# Patient Record
Sex: Female | Born: 1935 | Race: White | Hispanic: No | Marital: Married | Smoking: Never smoker
Health system: Southern US, Community
[De-identification: ages and names within clinical notes are randomized; demographics above are authoritative.]

## PROBLEM LIST (undated history)

## (undated) DIAGNOSIS — R51 Headache: Secondary | ICD-10-CM

## (undated) DIAGNOSIS — F419 Anxiety disorder, unspecified: Secondary | ICD-10-CM

## (undated) DIAGNOSIS — R251 Tremor, unspecified: Secondary | ICD-10-CM

## (undated) DIAGNOSIS — C801 Malignant (primary) neoplasm, unspecified: Secondary | ICD-10-CM

## (undated) DIAGNOSIS — H269 Unspecified cataract: Secondary | ICD-10-CM

## (undated) DIAGNOSIS — F329 Major depressive disorder, single episode, unspecified: Secondary | ICD-10-CM

## (undated) DIAGNOSIS — M549 Dorsalgia, unspecified: Secondary | ICD-10-CM

## (undated) DIAGNOSIS — IMO0001 Reserved for inherently not codable concepts without codable children: Secondary | ICD-10-CM

## (undated) DIAGNOSIS — E785 Hyperlipidemia, unspecified: Secondary | ICD-10-CM

## (undated) DIAGNOSIS — F32A Depression, unspecified: Secondary | ICD-10-CM

## (undated) DIAGNOSIS — I10 Essential (primary) hypertension: Secondary | ICD-10-CM

## (undated) DIAGNOSIS — G7000 Myasthenia gravis without (acute) exacerbation: Secondary | ICD-10-CM

## (undated) DIAGNOSIS — J189 Pneumonia, unspecified organism: Secondary | ICD-10-CM

## (undated) DIAGNOSIS — Z87442 Personal history of urinary calculi: Secondary | ICD-10-CM

## (undated) DIAGNOSIS — K219 Gastro-esophageal reflux disease without esophagitis: Secondary | ICD-10-CM

## (undated) DIAGNOSIS — D649 Anemia, unspecified: Secondary | ICD-10-CM

## (undated) DIAGNOSIS — H3530 Unspecified macular degeneration: Secondary | ICD-10-CM

## (undated) DIAGNOSIS — M1990 Unspecified osteoarthritis, unspecified site: Secondary | ICD-10-CM

## (undated) DIAGNOSIS — Z8669 Personal history of other diseases of the nervous system and sense organs: Secondary | ICD-10-CM

## (undated) DIAGNOSIS — Z974 Presence of external hearing-aid: Secondary | ICD-10-CM

## (undated) DIAGNOSIS — G8929 Other chronic pain: Secondary | ICD-10-CM

## (undated) DIAGNOSIS — H409 Unspecified glaucoma: Secondary | ICD-10-CM

## (undated) DIAGNOSIS — M4800 Spinal stenosis, site unspecified: Secondary | ICD-10-CM

## (undated) HISTORY — DX: Personal history of other diseases of the nervous system and sense organs: Z86.69

## (undated) HISTORY — DX: Unspecified glaucoma: H40.9

## (undated) HISTORY — PX: CHOLECYSTECTOMY: SHX55

## (undated) HISTORY — PX: SPINAL CORD STIMULATOR IMPLANT: SHX2422

## (undated) HISTORY — DX: Anemia, unspecified: D64.9

## (undated) HISTORY — PX: TONSILLECTOMY: SUR1361

## (undated) HISTORY — PX: ABDOMINAL HYSTERECTOMY: SHX81

## (undated) HISTORY — PX: COLON SURGERY: SHX602

## (undated) HISTORY — DX: Other chronic pain: G89.29

## (undated) HISTORY — DX: Hyperlipidemia, unspecified: E78.5

## (undated) HISTORY — DX: Depression, unspecified: F32.A

## (undated) HISTORY — DX: Major depressive disorder, single episode, unspecified: F32.9

## (undated) HISTORY — DX: Unspecified cataract: H26.9

## (undated) HISTORY — DX: Dorsalgia, unspecified: M54.9

## (undated) HISTORY — DX: Anxiety disorder, unspecified: F41.9

## (undated) HISTORY — PX: UPPER GASTROINTESTINAL ENDOSCOPY: SHX188

## (undated) HISTORY — DX: Tremor, unspecified: R25.1

## (undated) HISTORY — DX: Essential (primary) hypertension: I10

---

## 2000-12-01 ENCOUNTER — Ambulatory Visit (HOSPITAL_COMMUNITY): Admission: RE | Admit: 2000-12-01 | Discharge: 2000-12-01 | Payer: Self-pay | Admitting: Cardiology

## 2000-12-01 ENCOUNTER — Encounter: Payer: Self-pay | Admitting: Cardiology

## 2002-03-29 ENCOUNTER — Ambulatory Visit (HOSPITAL_COMMUNITY): Admission: RE | Admit: 2002-03-29 | Discharge: 2002-03-29 | Payer: Self-pay | Admitting: Cardiology

## 2002-03-29 ENCOUNTER — Encounter: Payer: Self-pay | Admitting: Cardiology

## 2002-04-07 ENCOUNTER — Ambulatory Visit (HOSPITAL_COMMUNITY): Admission: RE | Admit: 2002-04-07 | Discharge: 2002-04-07 | Payer: Self-pay | Admitting: Cardiology

## 2002-07-27 ENCOUNTER — Ambulatory Visit (HOSPITAL_COMMUNITY): Admission: RE | Admit: 2002-07-27 | Discharge: 2002-07-27 | Payer: Self-pay | Admitting: Ophthalmology

## 2003-05-10 ENCOUNTER — Encounter: Payer: Self-pay | Admitting: Cardiology

## 2003-05-10 ENCOUNTER — Encounter: Admission: RE | Admit: 2003-05-10 | Discharge: 2003-05-10 | Payer: Self-pay | Admitting: Cardiology

## 2004-02-12 ENCOUNTER — Ambulatory Visit (HOSPITAL_COMMUNITY): Admission: RE | Admit: 2004-02-12 | Discharge: 2004-02-12 | Payer: Self-pay | Admitting: Cardiology

## 2004-03-21 ENCOUNTER — Ambulatory Visit (HOSPITAL_COMMUNITY): Admission: RE | Admit: 2004-03-21 | Discharge: 2004-03-21 | Payer: Self-pay | Admitting: Obstetrics and Gynecology

## 2007-05-18 HISTORY — PX: US ECHOCARDIOGRAPHY: HXRAD669

## 2010-11-01 ENCOUNTER — Ambulatory Visit: Payer: Self-pay | Admitting: Cardiology

## 2011-04-19 NOTE — H&P (Signed)
NAME:  Tamara Gregory, Tamara Gregory                          ACCOUNT NO.:  [id number]   MEDICAL RECORD NO.:  [id number]                   PATIENT TYPE:  OIB   LOCATION:  2899                                 FACILITY:  [facility]   PHYSICIAN:  Guadelupe Sabin, M.D.             DATE OF BIRTH:  [birth year]   DATE OF ADMISSION:  07/27/2002  DATE OF DISCHARGE:                                HISTORY & PHYSICAL   PRESENT ILLNESS:  This was a planned outpatient surgical admission of this  77 year old white female admitted for cataract implant surgery of the left  eye.  This patient has been followed in my office for progressive cataract  formation.  She has recently noted increased difficulty in seeing and has  noted black clouds in her vision, trouble seeing the tennis ball on playing  tennis and having to adjust her glasses all the time while trying to read.  She has elected to proceed with cataract implant surgery of the left eye  now, right eye later.   PAST MEDICAL HISTORY:  The patient is in good general health under the care  of Dr. Othelia Pulling.  She has had a recent skin melanoma removed from her  face with surgery.   MEDICATIONS:  The patient takes multiple medications at the present time  including Estrogen, Zestril, Hydrochlorothiazide, Lysine, Vitamin C, E,  Betacarotene, Calcium, Avapro, Zoloft.   REVIEW OF SYSTEMS:  No cardiorespiratory complaints.   PHYSICAL EXAMINATION:  Vital signs as recorded on admission:  blood pressure  126/83, respirations 16, heart rate 68, temperature 97.5.  GENERAL APPEARANCE:  The patient is a pleasant, well-nourished, well-  developed, 77 year old white female in no acute distress.  Visual acuity  with best correction 20/30 -2 right eye, 20/50 left eye.  Applanation  tonometry near vision 20/50, applanation tonometry 12 mm each eye.  Extraocular and slit-lamp examination:  the eyes are wide and clear with  posterior subcapsular cataract change both eyes.   Detailed fundus  examination was dilated reveals the lens haze noted above.  The vitreous is  clear.  The retina is attached.  There is a small floater in front of the  optic nerve of the right eye.  There are a few retinal pigment epithelial  defects and drusen in both eyes.  CHEST:  Lungs clear to percussion and auscultation.  HEART:  Normal sinus rhythm.  No cardiomegaly.  No murmurs.  ABDOMEN:  Negative.  EXTREMITIES:  Negative.   ADMISSION DIAGNOSIS:  Posterior subcapsular cataract both eyes.   SURGICAL PLAN:  Cataract implant surgery left eye now, right eye later as  needed.                                               Guadelupe Sabin, M.D.  HNJ/MEDQ  D:  07/27/2002  T:  07/28/2002  Job:  [id number]

## 2011-04-19 NOTE — Procedures (Signed)
[place]  Patient:    Tamara Gregory, Tamara Gregory Visit Number: [id number] MRN: [id number]          Service Type: Attending:  Winn Jock. Smitty Cords, M.D. Dictated by:   Winn Jock Smitty Cords, M.D. Proc. Date: 04/07/02                             Procedure Report  CLINICAL DATA:  A 77 year old, white, married female with a history of hypertension, cardiomegaly, a family history of coronary disease, elevated lipids, and vague symptoms of marked fatigue on exertion.  She has a normal resting work-up without evidence of any other organic problems.  She has been submitted to a treadmill stress procedure to identify any underlying coronary artery disease or ischemia.  CURRENT MEDICATIONS: 1. Zestril. 2. Zoloft. 3. Pravachol. 4. Protonix. 5. Avapro 150 mg q.a.m.  PHYSICAL EXAMINATION:  Her weight is about 145 pounds.  Blood pressure at rest 160/80.  LABORATORY DATA:  Resting EKG normal with no evidence of LVH or abnormal ST-T wave changes.  DESCRIPTION OF PROCEDURE:  She was submitted to the standard Bruce protocol. She exercised a total of 3 minutes and 15 seconds, at which time she complained of marked fatigue.  She had a short run of SVT of about 10 beats. There were no ST or T wave changes.  She had experienced no chest pain. Following the procedure, post exercise the blood pressure returned to a level of 120/70 after five minutes and her pulse returned to 81.  She received a total peak pulse of 138.  There were no abnormal EKG changes otherwise noted. The Duke score of +6 indicates that she does not present as a major risk factor for immediate ischemic changes and represents a fair level of exercise, although she did not complete the total of about 6 mets.  Her prognosis appears to be good.  She will be further studied and identified to rule out other vascular components to her complaints.  Metabolic studies have thus far been negative. Dictated by:    Winn Jock Smitty Cords, M.D. Attending:  Winn Jock. Smitty Cords, M.D. DD:  04/09/02 TD:  04/10/02 Job: [id number]

## 2011-04-19 NOTE — H&P (Signed)
NAME:  Tamara Gregory, SOHAIL                          ACCOUNT NO.:  [id number]   MEDICAL RECORD NO.:  [id number]                   PATIENT TYPE:  OIB   LOCATION:  2899                                 FACILITY:  [facility]   PHYSICIAN:  Guadelupe Sabin, M.D.             DATE OF BIRTH:  [birth year]   DATE OF ADMISSION:  07/27/2002  DATE OF DISCHARGE:                                HISTORY & PHYSICAL   REASON FOR ADMISSION:  This was a planned outpatient surgical admission of  this 77 year old white female admitted for cataract implant surgery of the  left eye.   HISTORY OF PRESENT ILLNESS:  This patient has been followed in my office for  routine eye care since July 31, 1970.  Initial examination at that time revealed an uncorrected acuity of 20/30  right eye, 20/25 left eye.  Essentially normal ocular examination was found  with presbyopia.  The patient was followed for routine refraction changes.  The patient returned on July 01, 2002 and was noting blurred vision in both  eyes with trouble seeing her tennis ball, trouble with reading and the  patient was found to have posterior subcapsular cataracts in both eyes.  Acuity with best correction was recorded at 20/30- right eye, 20/50 left  eye.  Due to the patient's multiple complaints of poor vision and blurred  vision it was elected to proceed with cataract implant surgery of the left  eye now, right eye later.  The patient was given oral discussion and printed  information concerning the procedure and its possible complications.  She  signed an informed consent and arrangements were made for her outpatient  admission at this time.   PAST MEDICAL HISTORY:  The patient is in stable general health under the  care of Dr. Othelia Pulling.  She is felt to be in satisfactory condition for  the proposed surgery.  She has had a melanoma removed from her face.   MEDICATIONS:  She takes multiple medications including estrogen, Zestril,  hydrochlorothiazide, lysine, vitamin C, E, beta carotene, calcium, Avapro,  Zoloft, and ICAPS.   REVIEW OF SYSTEMS:  No cardiorespiratory complaints.   PHYSICAL EXAMINATION:  VITAL SIGNS: Blood pressure 126/83, respirations 16,  heart rate 68, temperature 97.5.  GENERAL APPEARANCE: The patient is a pleasant, well-nourished, well-  developed 77 year old white female in no acute distress.  HEENT: Eyes - Ocular exam as noted above with visual acuity.  Applanation  tonometry normal.  Detailed fundus examination - Dilated with Mydriacyl  reveals slight age-related macular degeneration with retinal pigment layer  epithial defects, few yellow dot drusen are noted temporal to the macula.  CHEST: Lungs clear to percussion and auscultation.  HEART: Normal sinus rhythm; no cardiomegaly, no murmurs.  ABDOMEN: Negative.  EXTREMITIES: Negative.   <ADMISSION DIAGNOSES/>  1. Posterior subcapsular cataract both eyes.  2. 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   PLAN:  Proceed with  cataract implant surgery left eye now, right eye later.                                               Guadelupe Sabin, M.D.    HNJ/MEDQ  D:  08/07/2002  T:  08/09/2002  Job:  [id number]

## 2011-04-19 NOTE — Op Note (Signed)
NAME:  Tamara Gregory, Tamara Gregory                          ACCOUNT NO.:  [id number]   MEDICAL RECORD NO.:  [id number]                   PATIENT TYPE:  OIB   LOCATION:  2899                                 FACILITY:  [facility]   PHYSICIAN:  Guadelupe Sabin, M.D.             DATE OF BIRTH:  [birth year]   DATE OF PROCEDURE:  07/27/2002  DATE OF DISCHARGE:                                 OPERATIVE REPORT   PREOPERATIVE DIAGNOSIS:  Posterior subcapsular cataract left eye.   POSTOPERATIVE DIAGNOSIS:  Posterior subcapsular cataract left eye.   OPERATION:  Planned extracapsular cataract extraction-phaco emulsification,  primary insertion of posterior chamber intraocular lens implant.   SURGEON:  Guadelupe Sabin, M.D.   ASSISTANT:  Nurse.   ANESTHESIA:  Local, 4% Xylocaine, 0.75 Marcaine, retrobulbar block, topical  Tetracaine, intraocular Xylocaine.  The patient was given Sodium fentanyl  intravenously during the period of retrobulbar blocking.   OPERATIVE PROCEDURE:  After the patient was prepped and draped the lid  speculum was inserted in the left eye.  She had tonometry was recorded at  five to six scale units with a 5.5 gram weight.  A peritomy was performed  adjacent to the limbus from the 11 to 1 o'clock position.  The corneoscleral  junction was cleaned and a corneoscleral groove made with a 45 degree super  blade.  The anterior chamber was then entered with the 2.5 mm diamond  keratome at the 12 o'clock position and the 15 degree blade at the 2:30  position.  Using a bent 26 guage needle and a Healon syringe a circular  capsulorrhexis was begun and then completed with the Grabow forceps.  Hydrodissection and hydrodelineation were performed using 1% Xylocaine.  A  30 degree phacoemulsification tip was then inserted with slow controlled  emulsification of the lens nucleus.  Total ultrasonic time 53 seconds.  Average power level 15%.  Total amount of fluid used 40 cc.  Following  removal of  the nucleus the residual cortex was aspirated with the irrigation  aspiration tip.  The posterior capsule appeared intact with a brilliant red  fundus reflex.  It was therefore elected to insert [employer] medical [employer]  [id number] three piece silcone posterior chamber intraocular lens implant.  Diopter strength +21.00.  This was inserted with a McDonald forceps into the  anterior chamber and then centered into the capsular bag using the [facility]  lens rotator.  The lens appeared to be well-centered.  The Healon which had  been used throughout the procedure was aspirated and replaced with balanced  salt solution and Miochol ophthalmic solution.  The operative incisions  appeared to be self-sealing and no sutures were required.  Maxitrol ointment  was instilled in the conjunctival cul-de-sac and a light patch and protector  shield applied.  Duration of procedure and anesthesia administration:  45  minutes.  The patient tolerated the procedure well in general, left the  operating room for the recovery room in good condition                                               Guadelupe Sabin, M.D.    HNJ/MEDQ  D:  07/27/2002  T:  07/28/2002  Job:  [id number]

## 2011-07-19 ENCOUNTER — Other Ambulatory Visit: Payer: Self-pay | Admitting: *Deleted

## 2011-07-19 DIAGNOSIS — E7800 Pure hypercholesterolemia, unspecified: Secondary | ICD-10-CM

## 2011-08-23 ENCOUNTER — Encounter: Payer: Self-pay | Admitting: Cardiology

## 2011-09-05 ENCOUNTER — Telehealth: Payer: Self-pay | Admitting: Cardiology

## 2011-09-05 ENCOUNTER — Other Ambulatory Visit: Payer: Self-pay | Admitting: Cardiology

## 2011-09-05 ENCOUNTER — Ambulatory Visit (INDEPENDENT_AMBULATORY_CARE_PROVIDER_SITE_OTHER): Payer: Medicare Other | Admitting: Cardiology

## 2011-09-05 ENCOUNTER — Encounter: Payer: Self-pay | Admitting: Cardiology

## 2011-09-05 ENCOUNTER — Other Ambulatory Visit (INDEPENDENT_AMBULATORY_CARE_PROVIDER_SITE_OTHER): Payer: Medicare Other | Admitting: *Deleted

## 2011-09-05 VITALS — BP 140/88 | HR 66 | Ht 64.0 in | Wt 156.0 lb

## 2011-09-05 DIAGNOSIS — M1990 Unspecified osteoarthritis, unspecified site: Secondary | ICD-10-CM

## 2011-09-05 DIAGNOSIS — F329 Major depressive disorder, single episode, unspecified: Secondary | ICD-10-CM

## 2011-09-05 DIAGNOSIS — I119 Hypertensive heart disease without heart failure: Secondary | ICD-10-CM | POA: Insufficient documentation

## 2011-09-05 DIAGNOSIS — E7800 Pure hypercholesterolemia, unspecified: Secondary | ICD-10-CM

## 2011-09-05 LAB — BASIC METABOLIC PANEL
BUN: 14 mg/dL (ref 6–23)
Calcium: 10.3 mg/dL (ref 8.4–10.5)
GFR: 80.6 mL/min (ref >60.00)
Glucose, Bld: 105 mg/dL — ABNORMAL HIGH (ref 70–99)

## 2011-09-05 LAB — HEPATIC FUNCTION PANEL
ALT: 20 U/L (ref 0–35)
AST: 24 U/L (ref 0–37)
Bilirubin, Direct: 0 mg/dL (ref 0.0–0.3)
Total Bilirubin: 0.5 mg/dL (ref 0.3–1.2)

## 2011-09-05 MED ORDER — PRAVASTATIN SODIUM 40 MG PO TABS: 40.0000 mg | ORAL_TABLET | Freq: Every day | ORAL | Status: DC

## 2011-09-05 MED ORDER — CITALOPRAM HYDROBROMIDE 20 MG PO TABS: 20.0000 mg | ORAL_TABLET | Freq: Every day | ORAL | Status: DC

## 2011-09-05 NOTE — Patient Instructions (Signed)
Discuss Losartan with husband to make sure she is taking and call and let us know.  Try Metamucil for gas pocket and let us know if no improvement.  Your physician wants you to follow-up in: 6 months You will receive a reminder letter in the mail two months in advance. If you don't receive a letter, please call our office to schedule the follow-up appointment.

## 2011-09-05 NOTE — Telephone Encounter (Signed)
Noted.  Message should have been Losartan not Lopressor

## 2011-09-05 NOTE — Telephone Encounter (Signed)
Pt calling to state that she is taking lopressor 100-25mg . Pt was advised to call office and let us know if she was or was not taking this medication.

## 2011-09-05 NOTE — Progress Notes (Signed)
Tamara Gregory Date of Birth:  07/16/1925 [facility] [address]   [address] [place], [place]  [address] [phone]           Fax   [phone]  HPI: This pleasant 77 year old woman is seen for a scheduled six-month followup office visit.  She has a history of essential hypertension, and hyperlipidemia.  She's also had some past symptoms of depression.  She's had trouble with her back and has been seeing a chiropractor, but has now stopped seeing him because he really didn't help her much.  He did tell her that some of her back pain was because of a pocket of gas in her abdomen.  Sometimes, doing housework bothers her back and other times, sitting quietly playing bridge, bothers her back.  Current Outpatient Prescriptions  Medication Sig Dispense Refill  . aspirin 81 MG tablet Take 81 mg by mouth daily.        . beta carotene 44034 UNIT capsule Take 25,000 Units by mouth daily.        . beta carotene w/minerals (OCUVITE) tablet Take 1 tablet by mouth daily.        . Calcium Carbonate (CALCIUM 500 PO) Take by mouth daily.        . cholecalciferol (VITAMIN D) 1000 UNITS tablet Take 1,000 Units by mouth daily.        . citalopram (CELEXA) 20 MG tablet Take 1 tablet (20 mg total) by mouth daily.  90 tablet  3  . fish oil-omega-3 fatty acids 1000 MG capsule Take 1 g by mouth daily.        . Glucosamine-Chondroit-Vit C-Mn (GLUCOSAMINE 1500 COMPLEX PO) Take by mouth daily.        [name] LYSINE PO Take by mouth daily.        . Multiple Vitamin (MULTIVITAMIN) tablet Take 1 tablet by mouth daily.        [name] omeprazole (PRILOSEC) 20 MG capsule Take 20 mg by mouth daily.        . pravastatin (PRAVACHOL) 40 MG tablet Take 1 tablet (40 mg total) by mouth daily.  90 tablet  3  . Thiamine HCl (VITAMIN B-1 PO) Take by mouth daily.        [name] losartan-hydrochlorothiazide (HYZAAR) 100-25 MG per tablet Take 1 tablet by mouth daily.          No Known Allergies  Patient Active  Problem List  Diagnoses  . Pure hypercholesterolemia  . Benign hypertensive heart disease without heart failure  . Osteoarthritis    History  Smoking status  . Never Smoker   Smokeless tobacco  . Not on file    History  Alcohol Use No    Family History  Problem Relation Age of Onset  . Heart attack Father   . Cancer Sister   . Cancer Brother   . Cancer Sister   . Cancer Sister     Review of Systems: The patient denies any heat or cold intolerance.  No weight gain or weight loss.  The patient denies headaches or blurry vision.  There is no cough or sputum production.  The patient denies dizziness.  There is no hematuria or hematochezia.  The patient denies any muscle aches or arthritis.  The patient denies any rash.  The patient denies frequent falling or instability.  There is no history of depression or anxiety.  All other systems were reviewed and are negative.   Physical Exam: Filed Vitals:  09/05/11 0900  BP: 140/88  Pulse: 66   General appearance reveals an alert woman in no acute distress.  She moves well and her back is not giving her too much trouble today.Pupils equal and reactive.   Extraocular Movements are full.  There is no scleral icterus.  The mouth and pharynx are normal.  The neck is supple.  The carotids reveal no bruits.  The jugular venous pressure is normal.  The thyroid is not enlarged.  There is no lymphadenopathy.  The chest is clear to percussion and auscultation. There are no rales or rhonchi. Expansion of the chest is symmetrical.  The precordium is quiet.  The first heart sound is normal.  The second heart sound is physiologically split.  There is no murmur gallop rub or click.  There is no abnormal lift or heave.  The abdomen is soft and nontender. Bowel sounds are normal. The liver and spleen are not enlarged. There Are no abdominal masses. There are no bruits.  The pedal pulses are good.  There is no phlebitis or edema.  There is no  cyanosis or clubbing. Strength is normal and symmetrical in all extremities.  There is no lateralizing weakness.  There are no sensory deficits.     Assessment / Plan: Blood work is pending today.  Continue same medication.  Recheck in 6 months for followup office visit and lab work

## 2011-09-05 NOTE — Assessment & Plan Note (Signed)
Patient is on pravastatin for her hypercholesterolemia.  She is not having side effects from the pravastatin

## 2011-09-05 NOTE — Assessment & Plan Note (Signed)
No chest pain or shortness of breath.  No dizziness or syncope.  She does not do any regular exercise because of her osteoarthritis of the back

## 2011-09-13 NOTE — Progress Notes (Signed)
Left message

## 2011-09-16 ENCOUNTER — Telehealth: Payer: Self-pay | Admitting: *Deleted

## 2011-09-16 ENCOUNTER — Telehealth: Payer: Self-pay | Admitting: Cardiology

## 2011-09-16 NOTE — Telephone Encounter (Signed)
Pt calling about test results   Please ca;ll

## 2011-09-16 NOTE — Telephone Encounter (Signed)
Advised of labs 

## 2011-09-16 NOTE — Progress Notes (Signed)
Advised of labs 

## 2011-09-16 NOTE — Telephone Encounter (Signed)
Results given to patient

## 2011-09-16 NOTE — Telephone Encounter (Signed)
Message copied by Burnell Blanks on Mon Sep 16, 2011  1:20 PM ------      Message from: Cassell Clement      Created: Fri Sep 06, 2011  5:48 PM       The cholesterol is borderline at 203.  Triglycerides are 160.  Liver and kidney function are normal and the blood sugar is 105.  Continue same medication and stay on careful diet.

## 2011-09-16 NOTE — Telephone Encounter (Signed)
Message copied by Burnell Blanks on Mon Sep 16, 2011  1:20 PM ------      Message from: Cassell Clement      Created: Fri Sep 06, 2011  5:49 PM       The LDL, is 117.  Work harder on diet and exercise.  Same medication.

## 2011-12-06 DIAGNOSIS — H35059 Retinal neovascularization, unspecified, unspecified eye: Secondary | ICD-10-CM | POA: Diagnosis not present

## 2011-12-06 DIAGNOSIS — H353290 Exudative age-related macular degeneration, unspecified eye, stage unspecified: Secondary | ICD-10-CM | POA: Diagnosis not present

## 2012-01-14 DIAGNOSIS — H3530 Unspecified macular degeneration: Secondary | ICD-10-CM | POA: Diagnosis not present

## 2012-01-14 DIAGNOSIS — H02409 Unspecified ptosis of unspecified eyelid: Secondary | ICD-10-CM | POA: Diagnosis not present

## 2012-01-14 DIAGNOSIS — M48061 Spinal stenosis, lumbar region without neurogenic claudication: Secondary | ICD-10-CM | POA: Diagnosis present

## 2012-01-14 DIAGNOSIS — H2640 Unspecified secondary cataract: Secondary | ICD-10-CM | POA: Diagnosis not present

## 2012-01-15 DIAGNOSIS — H3530 Unspecified macular degeneration: Secondary | ICD-10-CM | POA: Diagnosis not present

## 2012-01-15 DIAGNOSIS — H2640 Unspecified secondary cataract: Secondary | ICD-10-CM | POA: Diagnosis not present

## 2012-01-15 DIAGNOSIS — H02409 Unspecified ptosis of unspecified eyelid: Secondary | ICD-10-CM | POA: Diagnosis not present

## 2012-01-27 DIAGNOSIS — G7000 Myasthenia gravis without (acute) exacerbation: Secondary | ICD-10-CM | POA: Diagnosis not present

## 2012-01-27 DIAGNOSIS — I10 Essential (primary) hypertension: Secondary | ICD-10-CM | POA: Diagnosis not present

## 2012-01-28 ENCOUNTER — Ambulatory Visit
Admission: RE | Admit: 2012-01-28 | Discharge: 2012-01-28 | Disposition: A | Discharge status: Home / Self Care | Payer: Medicare Other | Source: Ambulatory Visit | Attending: Clinical Neurophysiology | Admitting: Clinical Neurophysiology

## 2012-01-28 ENCOUNTER — Other Ambulatory Visit: Payer: Self-pay | Admitting: Clinical Neurophysiology

## 2012-01-28 DIAGNOSIS — G7000 Myasthenia gravis without (acute) exacerbation: Secondary | ICD-10-CM | POA: Diagnosis not present

## 2012-01-28 DIAGNOSIS — I712 Thoracic aortic aneurysm, without rupture: Secondary | ICD-10-CM | POA: Diagnosis not present

## 2012-01-28 DIAGNOSIS — R918 Other nonspecific abnormal finding of lung field: Secondary | ICD-10-CM | POA: Diagnosis not present

## 2012-03-05 ENCOUNTER — Other Ambulatory Visit: Payer: Medicare Other

## 2012-03-05 ENCOUNTER — Ambulatory Visit: Payer: Medicare Other | Admitting: Cardiology

## 2012-03-10 DIAGNOSIS — H35059 Retinal neovascularization, unspecified, unspecified eye: Secondary | ICD-10-CM | POA: Diagnosis not present

## 2012-03-10 DIAGNOSIS — H353290 Exudative age-related macular degeneration, unspecified eye, stage unspecified: Secondary | ICD-10-CM | POA: Diagnosis not present

## 2012-03-17 ENCOUNTER — Ambulatory Visit (INDEPENDENT_AMBULATORY_CARE_PROVIDER_SITE_OTHER): Payer: Medicare Other | Admitting: Cardiology

## 2012-03-17 DIAGNOSIS — E7800 Pure hypercholesterolemia, unspecified: Secondary | ICD-10-CM | POA: Diagnosis not present

## 2012-03-17 DIAGNOSIS — G7000 Myasthenia gravis without (acute) exacerbation: Secondary | ICD-10-CM

## 2012-03-17 DIAGNOSIS — I119 Hypertensive heart disease without heart failure: Secondary | ICD-10-CM | POA: Diagnosis not present

## 2012-03-17 DIAGNOSIS — M1990 Unspecified osteoarthritis, unspecified site: Secondary | ICD-10-CM | POA: Diagnosis not present

## 2012-03-17 NOTE — Assessment & Plan Note (Signed)
Since she was diagnosed directly with the myasthenia gravis based upon her eye signs she has also felt better all over.  She seems to have more energy.  In retrospect some of her previous malaise and fatigue may have been related to undiagnosed myasthenia gravis affecting her skeletal muscles

## 2012-03-17 NOTE — Patient Instructions (Signed)
Your physician recommends that you continue on your current medications as directed. Please refer to the Current Medication list given to you today.  Your physician wants you to follow-up in: 6 months. You will receive a reminder letter in the mail two months in advance. If you don't receive a letter, please call our office to schedule the follow-up appointment.  

## 2012-03-17 NOTE — Progress Notes (Signed)
Tamara Gregory Date of Birth:  Jun 01, 1925 [facility] [address] [place], [place]  [address] [phone]         Fax   [phone]  History of Present Illness: This pleasant 78 year old woman is seen for a month followup office visit.  She has a past history of hypercholesterolemia and essential hypertension.  Also has chronic low back pain secondary to arthritis.  Since we last saw her she has been diagnosed with myasthenia gravis which caused her to have droopy eyelids.  She was worked up over at [facility] and now takes Mestinon when necessary.  Current Outpatient Prescriptions  Medication Sig Dispense Refill  . aspirin 81 MG tablet Take 81 mg by mouth daily.        . beta carotene 29528 UNIT capsule Take 25,000 Units by mouth daily.        . beta carotene w/minerals (OCUVITE) tablet Take 1 tablet by mouth daily.        . Calcium Carbonate (CALCIUM 500 PO) Take by mouth daily.        . cholecalciferol (VITAMIN D) 1000 UNITS tablet Take 1,000 Units by mouth daily.        . citalopram (CELEXA) 20 MG tablet Take 1 tablet (20 mg total) by mouth daily.  90 tablet  3  . fish oil-omega-3 fatty acids 1000 MG capsule Take 1 g by mouth daily.        . Glucosamine-Chondroit-Vit C-Mn (GLUCOSAMINE 1500 COMPLEX PO) Take by mouth daily.        [name] losartan-hydrochlorothiazide (HYZAAR) 100-25 MG per tablet Take 1 tablet by mouth daily.        [name] LYSINE PO Take by mouth daily.        . Multiple Vitamin (MULTIVITAMIN) tablet Take 1 tablet by mouth daily.        [name] omeprazole (PRILOSEC) 20 MG capsule Take 20 mg by mouth daily.        . pravastatin (PRAVACHOL) 40 MG tablet Take 1 tablet (40 mg total) by mouth daily.  90 tablet  3  . pyridostigmine (MESTINON) 60 MG tablet 60 mg as needed.       . Thiamine HCl (VITAMIN B-1 PO) Take by mouth daily.          No Known Allergies  Patient Active Problem List  Diagnoses  . Pure hypercholesterolemia  . Benign hypertensive heart  disease without heart failure  . Osteoarthritis  . Myasthenia gravis    History  Smoking status  . Never Smoker   Smokeless tobacco  . Not on file    History  Alcohol Use No    Family History  Problem Relation Age of Onset  . Heart attack Father   . Cancer Sister   . Cancer Brother   . Cancer Sister   . Cancer Sister     Review of Systems: Constitutional: no fever chills diaphoresis or fatigue or change in weight.  Head and neck: no hearing loss, no epistaxis, no photophobia or visual disturbance. Respiratory: No cough, shortness of breath or wheezing. Cardiovascular: No chest pain peripheral edema, palpitations. Gastrointestinal: No abdominal distention, no abdominal pain, no change in bowel habits hematochezia or melena. Genitourinary: No dysuria, no frequency, no urgency, no nocturia. Musculoskeletal:No arthralgias, no back pain, no gait disturbance or myalgias. Neurological: No dizziness, no headaches, no numbness, no seizures, no syncope, no weakness, no tremors. Hematologic: No lymphadenopathy, no easy bruising. Psychiatric: No confusion, no hallucinations, no sleep  disturbance.    Physical Exam: Filed Vitals:   03/17/12 1543  BP: 130/70  Pulse: 60   the general appearance reveals a well-developed well-nourished woman in no distress.  Her eyelids appear to be normal today.Pupils equal and reactive.   Extraocular Movements are full.  There is no scleral icterus.  The mouth and pharynx are normal.  The neck is supple.  The carotids reveal no bruits.  The jugular venous pressure is normal.  The thyroid is not enlarged.  There is no lymphadenopathy.  The chest is clear to percussion and auscultation. There are no rales or rhonchi. Expansion of the chest is symmetrical.  The precordium is quiet.  The first heart sound is normal.  The second heart sound is physiologically split.  There is no murmur gallop rub or click.  There is no abnormal lift or heave.  The  abdomen is soft and nontender. Bowel sounds are normal. The liver and spleen are not enlarged. There Are no abdominal masses. There are no bruits.  The pedal pulses are good.  There is no phlebitis or edema.  There is no cyanosis or clubbing. Strength is normal and symmetrical in all extremities.  There is no lateralizing weakness.  There are no sensory deficits.  The skin is warm and dry.  There is no rash.    Assessment / Plan:  the patient is to continue same medication.  She needs to work harder on weight loss and regular exercise.  Recheck in 6 months for followup office visit and fasting lab work

## 2012-03-17 NOTE — Assessment & Plan Note (Signed)
The patient has been on pravastatin 40 mg daily for her elevated cholesterol.  She is not having any myalgias from statin therapy.

## 2012-03-17 NOTE — Assessment & Plan Note (Signed)
The patient has a past history of elevated blood pressure.  She has responded well to current therapy.  She's not having a headaches or dizzy spells.

## 2012-03-17 NOTE — Assessment & Plan Note (Signed)
If the patient is physically active she develops an aching discomfort in her low back.  If she is able to rest for 15 minutes she is okay and is able to resume normal activity.

## 2012-03-30 ENCOUNTER — Other Ambulatory Visit: Payer: Self-pay | Admitting: Cardiology

## 2012-03-30 ENCOUNTER — Other Ambulatory Visit: Payer: Self-pay

## 2012-03-30 MED ORDER — LOSARTAN POTASSIUM-HCTZ 100-25 MG PO TABS: 1.0000 | ORAL_TABLET | Freq: Every day | ORAL | Status: DC

## 2012-04-10 DIAGNOSIS — H532 Diplopia: Secondary | ICD-10-CM | POA: Diagnosis not present

## 2012-04-23 DIAGNOSIS — G7000 Myasthenia gravis without (acute) exacerbation: Secondary | ICD-10-CM | POA: Diagnosis not present

## 2012-04-29 DIAGNOSIS — C44529 Squamous cell carcinoma of skin of other part of trunk: Secondary | ICD-10-CM | POA: Diagnosis not present

## 2012-08-24 DIAGNOSIS — Z23 Encounter for immunization: Secondary | ICD-10-CM | POA: Diagnosis not present

## 2012-09-17 DIAGNOSIS — G7000 Myasthenia gravis without (acute) exacerbation: Secondary | ICD-10-CM | POA: Diagnosis not present

## 2012-09-17 DIAGNOSIS — H3530 Unspecified macular degeneration: Secondary | ICD-10-CM | POA: Diagnosis not present

## 2012-09-18 ENCOUNTER — Other Ambulatory Visit: Payer: Self-pay | Admitting: Cardiology

## 2012-10-08 ENCOUNTER — Ambulatory Visit: Payer: Medicare Other | Admitting: Cardiology

## 2012-10-08 ENCOUNTER — Other Ambulatory Visit (INDEPENDENT_AMBULATORY_CARE_PROVIDER_SITE_OTHER): Payer: Medicare Other

## 2012-10-08 DIAGNOSIS — E7800 Pure hypercholesterolemia, unspecified: Secondary | ICD-10-CM

## 2012-10-08 DIAGNOSIS — I119 Hypertensive heart disease without heart failure: Secondary | ICD-10-CM | POA: Diagnosis not present

## 2012-10-08 LAB — BASIC METABOLIC PANEL
BUN: 16 mg/dL (ref 6–23)
Calcium: 9.4 mg/dL (ref 8.4–10.5)
GFR: 77.93 mL/min (ref >60.00)
Glucose, Bld: 115 mg/dL — ABNORMAL HIGH (ref 70–99)
Potassium: 3.3 mEq/L — ABNORMAL LOW (ref 3.5–5.1)
Sodium: 136 mEq/L (ref 135–145)

## 2012-10-08 LAB — LIPID PANEL
HDL: 59.2 mg/dL (ref >39.00)
LDL Cholesterol: 89 mg/dL (ref 0–99)
VLDL: 33 mg/dL (ref 0.0–40.0)

## 2012-10-08 LAB — HEPATIC FUNCTION PANEL
AST: 25 U/L (ref 0–37)
Albumin: 4.3 g/dL (ref 3.5–5.2)
Alkaline Phosphatase: 47 U/L (ref 39–117)
Total Bilirubin: 0.5 mg/dL (ref 0.3–1.2)

## 2012-10-08 NOTE — Progress Notes (Signed)
Quick Note:  Please make copy of labs for patient visit. ______ 

## 2012-10-16 ENCOUNTER — Encounter: Payer: Self-pay | Admitting: Cardiology

## 2012-10-16 ENCOUNTER — Ambulatory Visit (INDEPENDENT_AMBULATORY_CARE_PROVIDER_SITE_OTHER): Payer: Medicare Other | Admitting: Cardiology

## 2012-10-16 VITALS — BP 128/65 | HR 78 | Resp 18 | Ht 63.0 in | Wt 165.8 lb

## 2012-10-16 DIAGNOSIS — E876 Hypokalemia: Secondary | ICD-10-CM | POA: Diagnosis not present

## 2012-10-16 DIAGNOSIS — E7800 Pure hypercholesterolemia, unspecified: Secondary | ICD-10-CM | POA: Diagnosis not present

## 2012-10-16 DIAGNOSIS — I119 Hypertensive heart disease without heart failure: Secondary | ICD-10-CM

## 2012-10-16 DIAGNOSIS — M1990 Unspecified osteoarthritis, unspecified site: Secondary | ICD-10-CM | POA: Diagnosis not present

## 2012-10-16 DIAGNOSIS — R5383 Other fatigue: Secondary | ICD-10-CM

## 2012-10-16 DIAGNOSIS — R5381 Other malaise: Secondary | ICD-10-CM

## 2012-10-16 MED ORDER — POTASSIUM CHLORIDE ER 10 MEQ PO TBCR: 10.0000 meq | EXTENDED_RELEASE_TABLET | Freq: Every day | ORAL | Status: DC

## 2012-10-16 NOTE — Assessment & Plan Note (Signed)
The patient has not been expressing any chest pain or shortness of breath.  She does not have a lot of energy.  She fatigues easily.  She has not been experiencing any headaches or palpitations.  No syncope.

## 2012-10-16 NOTE — Patient Instructions (Addendum)
ADD POTASSIUM 10 MEQ DAILY, RX SENT TO [facility]  Your physician wants you to follow-up in: 6 months with fasting labs (lp/bmet/hfp) You will receive a reminder letter in the mail two months in advance. If you don't receive a letter, please call our office to schedule the follow-up appointment.

## 2012-10-16 NOTE — Assessment & Plan Note (Signed)
She states that she is no longer on Mestinon.  She is on prednisone 10 mg daily.  A recent labs showed hypokalemia and mild elevation of blood sugar.  Both of these may be side effects from the prednisone.  She has been trying to eat a banana a day and we will also give her K. Dur 10 mEq one daily.

## 2012-10-16 NOTE — Assessment & Plan Note (Signed)
Patient has a history of hypercholesterolemia.  She is on pravastatin and is tolerating it well without side effects.  Blood work is satisfactory

## 2012-10-16 NOTE — Progress Notes (Signed)
Tamara Gregory Date of Birth:  March 01, 1925 [facility] [address] [place], [place]  [address] [phone]         Fax   [phone]  History of Present Illness: This pleasant 78 year old woman is seen for a month followup office visit. She has a past history of hypercholesterolemia and essential hypertension. Also has chronic low back pain secondary to arthritis. Since we last saw her she has been diagnosed with myasthenia gravis which caused her to have droopy eyelids. She was worked up over at [facility] and now takes prednisone 10 mg daily.   Current Outpatient Prescriptions  Medication Sig Dispense Refill  . aspirin 81 MG tablet Take 81 mg by mouth daily.        . beta carotene 57846 UNIT capsule Take 25,000 Units by mouth daily.        . beta carotene w/minerals (OCUVITE) tablet Take 1 tablet by mouth daily.        . Calcium Carbonate (CALCIUM 500 PO) Take by mouth daily.        . cholecalciferol (VITAMIN D) 1000 UNITS tablet Take 1,000 Units by mouth daily.        . citalopram (CELEXA) 20 MG tablet TAKE 1 TABLET DAILY  90 tablet  PRN  . fish oil-omega-3 fatty acids 1000 MG capsule Take 1 g by mouth daily.        . Glucosamine-Chondroit-Vit C-Mn (GLUCOSAMINE 1500 COMPLEX PO) Take by mouth daily.        [name] losartan-hydrochlorothiazide (HYZAAR) 100-25 MG per tablet Take 1 tablet by mouth daily.  30 tablet  6  . LYSINE PO Take by mouth daily.        . Multiple Vitamin (MULTIVITAMIN) tablet Take 1 tablet by mouth daily.        . pravastatin (PRAVACHOL) 40 MG tablet TAKE 1 TABLET DAILY  90 tablet  PRN  . predniSONE (DELTASONE) 10 MG tablet Take 1 tablet by mouth daily before breakfast.      . Thiamine HCl (VITAMIN B-1 PO) Take by mouth daily.        [name] omeprazole (PRILOSEC) 20 MG capsule Take 20 mg by mouth daily.        . potassium chloride (K-DUR) 10 MEQ tablet Take 1 tablet (10 mEq total) by mouth daily.  90 tablet  3  . pyridostigmine (MESTINON) 60 MG  tablet 60 mg as needed.         No Known Allergies  Patient Active Problem List  Diagnosis  . Pure hypercholesterolemia  . Benign hypertensive heart disease without heart failure  . Osteoarthritis  . Myasthenia gravis    History  Smoking status  . Never Smoker   Smokeless tobacco  . Not on file    History  Alcohol Use No    Family History  Problem Relation Age of Onset  . Heart attack Father   . Cancer Sister   . Cancer Brother   . Cancer Sister   . Cancer Sister     Review of Systems: Constitutional: no fever chills diaphoresis or fatigue or change in weight.  Head and neck: no hearing loss, no epistaxis, no photophobia or visual disturbance. Respiratory: No cough, shortness of breath or wheezing. Cardiovascular: No chest pain peripheral edema, palpitations. Gastrointestinal: No abdominal distention, no abdominal pain, no change in bowel habits hematochezia or melena. Genitourinary: No dysuria, no frequency, no urgency, no nocturia. Musculoskeletal:No arthralgias, no back pain, no gait disturbance or myalgias.  Neurological: No dizziness, no headaches, no numbness, no seizures, no syncope, no weakness, no tremors. Hematologic: No lymphadenopathy, no easy bruising. Psychiatric: No confusion, no hallucinations, no sleep disturbance.    Physical Exam: Filed Vitals:   10/16/12 1146  BP: 128/65  Pulse: 78  Resp: 18   the general appearance reveals a well-developed well-nourished woman in no distress.The head and neck exam reveals pupils equal and reactive.  Extraocular movements are full.  There is no scleral icterus.  The mouth and pharynx are normal.  The neck is supple.  The carotids reveal no bruits.  The jugular venous pressure is normal.  The  thyroid is not enlarged.  There is no lymphadenopathy.  The chest is clear to percussion and auscultation.  There are no rales or rhonchi.  Expansion of the chest is symmetrical.  The precordium is quiet.  The first heart  sound is normal.  The second heart sound is physiologically split.  There is no murmur gallop rub or click.  There is no abnormal lift or heave.  The abdomen is soft and nontender.  The bowel sounds are normal.  The liver and spleen are not enlarged.  There are no abdominal masses.  There are no abdominal bruits.  Extremities reveal good pedal pulses.  There is no phlebitis or edema.  There is no cyanosis or clubbing.  Strength is normal and symmetrical in all extremities.  There is no lateralizing weakness.  There are no sensory deficits.  The skin is warm and dry.  There is no rash.     Assessment / Plan: Continue on same medication.  And K. Dur 10 mEq daily.  Activity as tolerated.  Recheck in 6 months for followup office visit EKG lipid panel hepatic function panel and TSH and basal metabolic panel

## 2012-10-16 NOTE — Assessment & Plan Note (Signed)
The patient has a lot of low back pain which bothers her when she tries to do housework such as cooking or cleaning or ironing or doing the wash.  She lies down for 15 minutes the discomfort resolves and she is able to go back to her activities.

## 2012-10-27 ENCOUNTER — Other Ambulatory Visit: Payer: Self-pay | Admitting: Otolaryngology

## 2012-10-27 DIAGNOSIS — R1310 Dysphagia, unspecified: Secondary | ICD-10-CM

## 2012-10-28 ENCOUNTER — Other Ambulatory Visit: Payer: Self-pay | Admitting: Cardiology

## 2012-10-28 MED ORDER — LOSARTAN POTASSIUM-HCTZ 100-25 MG PO TABS: 1.0000 | ORAL_TABLET | Freq: Every day | ORAL | Status: DC

## 2012-11-05 ENCOUNTER — Ambulatory Visit
Admission: RE | Admit: 2012-11-05 | Discharge: 2012-11-05 | Disposition: A | Discharge status: Home / Self Care | Payer: Medicare Other | Source: Ambulatory Visit | Attending: Otolaryngology | Admitting: Otolaryngology

## 2012-11-05 DIAGNOSIS — R1310 Dysphagia, unspecified: Secondary | ICD-10-CM

## 2012-11-05 DIAGNOSIS — K225 Diverticulum of esophagus, acquired: Secondary | ICD-10-CM | POA: Diagnosis not present

## 2012-11-05 DIAGNOSIS — K449 Diaphragmatic hernia without obstruction or gangrene: Secondary | ICD-10-CM | POA: Diagnosis not present

## 2012-11-18 DIAGNOSIS — K225 Diverticulum of esophagus, acquired: Secondary | ICD-10-CM | POA: Diagnosis not present

## 2012-12-22 DIAGNOSIS — M48061 Spinal stenosis, lumbar region without neurogenic claudication: Secondary | ICD-10-CM | POA: Diagnosis not present

## 2012-12-26 DIAGNOSIS — M48061 Spinal stenosis, lumbar region without neurogenic claudication: Secondary | ICD-10-CM | POA: Diagnosis not present

## 2012-12-28 DIAGNOSIS — M48061 Spinal stenosis, lumbar region without neurogenic claudication: Secondary | ICD-10-CM | POA: Diagnosis not present

## 2012-12-29 ENCOUNTER — Other Ambulatory Visit: Payer: Self-pay | Admitting: Orthopedic Surgery

## 2012-12-29 DIAGNOSIS — M4800 Spinal stenosis, site unspecified: Secondary | ICD-10-CM

## 2012-12-31 ENCOUNTER — Ambulatory Visit
Admission: RE | Admit: 2012-12-31 | Discharge: 2012-12-31 | Disposition: A | Discharge status: Home / Self Care | Payer: Medicare Other | Source: Ambulatory Visit | Attending: Orthopedic Surgery | Admitting: Orthopedic Surgery

## 2012-12-31 ENCOUNTER — Other Ambulatory Visit: Payer: Self-pay | Admitting: Orthopedic Surgery

## 2012-12-31 ENCOUNTER — Other Ambulatory Visit: Payer: Medicare Other

## 2012-12-31 ENCOUNTER — Inpatient Hospital Stay
Admission: RE | Admit: 2012-12-31 | Discharge: 2012-12-31 | Disposition: A | Discharge status: Home / Self Care | Payer: Self-pay | Source: Ambulatory Visit | Attending: Orthopedic Surgery | Admitting: Orthopedic Surgery

## 2012-12-31 VITALS — BP 149/87 | HR 78

## 2012-12-31 DIAGNOSIS — M4310 Spondylolisthesis, site unspecified: Secondary | ICD-10-CM | POA: Diagnosis not present

## 2012-12-31 DIAGNOSIS — M4800 Spinal stenosis, site unspecified: Secondary | ICD-10-CM

## 2012-12-31 DIAGNOSIS — R52 Pain, unspecified: Secondary | ICD-10-CM

## 2012-12-31 DIAGNOSIS — M48061 Spinal stenosis, lumbar region without neurogenic claudication: Secondary | ICD-10-CM | POA: Diagnosis not present

## 2012-12-31 DIAGNOSIS — M47817 Spondylosis without myelopathy or radiculopathy, lumbosacral region: Secondary | ICD-10-CM | POA: Diagnosis not present

## 2012-12-31 DIAGNOSIS — M5137 Other intervertebral disc degeneration, lumbosacral region: Secondary | ICD-10-CM | POA: Diagnosis not present

## 2012-12-31 MED ORDER — ONDANSETRON HCL 4 MG/2ML IJ SOLN: 4.0000 mg | Freq: Four times a day (QID) | INTRAMUSCULAR | Status: DC | PRN

## 2012-12-31 MED ORDER — IOHEXOL 180 MG/ML  SOLN
15.0000 mL | Freq: Once | INTRAMUSCULAR | Status: AC | PRN
  Administered 2012-12-31: 15 mL via INTRATHECAL

## 2012-12-31 MED ORDER — ALUM & MAG HYDROXIDE-SIMETH 200-200-20 MG/5ML PO SUSP
30.0000 mL | Freq: Once | ORAL | Status: AC
  Administered 2012-12-31: 30 mL via ORAL

## 2012-12-31 MED ORDER — RANITIDINE HCL 150 MG PO TABS: 150.0000 mg | ORAL_TABLET | Freq: Once | ORAL | Status: DC

## 2012-12-31 MED ORDER — ONDANSETRON HCL 4 MG/2ML IJ SOLN
4.0000 mg | Freq: Once | INTRAMUSCULAR | Status: AC
  Administered 2012-12-31: 4 mg via INTRAMUSCULAR

## 2012-12-31 NOTE — Progress Notes (Signed)
Patient resting quietly with HOB elevated approx. 30 degrees per Dr. Benard Rink secondary to patient's c/o nausea "all morning" along with c/o chest pain, tightness and pressure.  After receiving Maalox and Zantac along with some juice for "the shakes," patient states she is feeling better and that her symptoms have resolved.   Dr. Benard Rink aware of situation.  Agrees complaints likely GI-related, not cardiac.  Pulse ox pleth with equal and regular waveform.  jkl

## 2012-12-31 NOTE — Progress Notes (Signed)
Zantac 150 mg po given for chest pain possibly from lying on her belly for procedure. Larina Earthly RN

## 2012-12-31 NOTE — Progress Notes (Signed)
Patient medicated with IM Zofran at 1045 for c/o nausea "all morning," even before she got here.  Now c/o chest pain, tightness, pressure.  jkl

## 2012-12-31 NOTE — Progress Notes (Addendum)
Patient states she has been off Celexa for the past two days.  Valium not ordered/given prior to procedure d/t patient's age/frailty.  Donell Sievert, RN

## 2013-01-05 DIAGNOSIS — M48061 Spinal stenosis, lumbar region without neurogenic claudication: Secondary | ICD-10-CM | POA: Diagnosis not present

## 2013-01-11 ENCOUNTER — Telehealth: Payer: Self-pay | Admitting: Cardiology

## 2013-01-11 NOTE — Telephone Encounter (Signed)
Husband wants  Dr. Patty Sermons to evaluate patient, states she had a lot going on with back pain and just not doing well.  Scheduled appointment for Wednesday ( Dr. Patty Sermons not in until then)

## 2013-01-11 NOTE — Telephone Encounter (Signed)
New Problem    Pt's husband calling in wanting to set up an appt his wife to be seen today. States she is not responding well and hasnt been doing well for a while. Would like to speak to nurse.

## 2013-01-13 ENCOUNTER — Encounter: Payer: Self-pay | Admitting: Cardiology

## 2013-01-13 ENCOUNTER — Ambulatory Visit (INDEPENDENT_AMBULATORY_CARE_PROVIDER_SITE_OTHER): Payer: Medicare Other | Admitting: Cardiology

## 2013-01-13 VITALS — BP 153/100 | HR 83 | Ht 64.0 in | Wt 156.0 lb

## 2013-01-13 DIAGNOSIS — I119 Hypertensive heart disease without heart failure: Secondary | ICD-10-CM

## 2013-01-13 DIAGNOSIS — E7800 Pure hypercholesterolemia, unspecified: Secondary | ICD-10-CM | POA: Diagnosis not present

## 2013-01-13 MED ORDER — AMLODIPINE BESYLATE 5 MG PO TABS: 5.0000 mg | ORAL_TABLET | Freq: Every day | ORAL | Status: DC

## 2013-01-13 NOTE — Assessment & Plan Note (Signed)
The patient is presently on 10 mg of prednisone daily for her myasthenia gravis.  She is no longer taking Mestinon.  The prednisone may be adversely affecting her high blood pressure.

## 2013-01-13 NOTE — Assessment & Plan Note (Signed)
The patient has a history of hypercholesterolemia.  She is on pravastatin 40 mg daily.  She's not having any myalgias from the pravastatin.

## 2013-01-13 NOTE — Patient Instructions (Signed)
START AMLODIPINE 5 MG DAILY  Your physician recommends that you schedule a follow-up appointment in: 3 months with fasting labs (LP/BMET/HFP)

## 2013-01-13 NOTE — Progress Notes (Signed)
Tamara Gregory Date of Birth:  07-05-1925 [facility] [address] [place], [place]  [address] [phone]         Fax   [phone]  History of Present Illness: This pleasant 79 year old woman is seen for work in followup office visit.  She has a past history of hypercholesterolemia and essential hypertension.  She has a history of myasthenia gravis and is followed for that at [facility].  She comes in today because of problems with her back.  She has been diagnosed with spinal stenosis.  She has seen Dr. Darrelyn Hillock and yesterday Dr. Ethelene Hal gave her a steroid injection into her back.  The patient has also been having problems with inadequate blood pressure control.  Her husband is also concerned because Percocet made her hallucinate.  Current Outpatient Prescriptions  Medication Sig Dispense Refill  . aspirin 81 MG tablet Take 81 mg by mouth daily.        . beta carotene 52841 UNIT capsule Take 25,000 Units by mouth daily.        . beta carotene w/minerals (OCUVITE) tablet Take 1 tablet by mouth daily.        . Calcium Carbonate (CALCIUM 500 PO) Take by mouth daily.        . cholecalciferol (VITAMIN D) 1000 UNITS tablet Take 1,000 Units by mouth daily.        . citalopram (CELEXA) 20 MG tablet TAKE 1 TABLET DAILY  90 tablet  PRN  . fish oil-omega-3 fatty acids 1000 MG capsule Take 1 g by mouth daily.        . Glucosamine-Chondroit-Vit C-Mn (GLUCOSAMINE 1500 COMPLEX PO) Take by mouth daily.        [name] losartan-hydrochlorothiazide (HYZAAR) 100-25 MG per tablet Take 1 tablet by mouth daily.  30 tablet  6  . LYSINE PO Take by mouth daily.        . Multiple Vitamin (MULTIVITAMIN) tablet Take 1 tablet by mouth daily.        [name] omeprazole (PRILOSEC) 20 MG capsule Take 20 mg by mouth daily.        . potassium chloride (K-DUR) 10 MEQ tablet Take 1 tablet (10 mEq total) by mouth daily.  90 tablet  3  . pravastatin (PRAVACHOL) 40 MG tablet TAKE 1 TABLET DAILY  90 tablet  PRN    . predniSONE (DELTASONE) 10 MG tablet Take 1 tablet by mouth daily before breakfast.      . pyridostigmine (MESTINON) 60 MG tablet 60 mg as needed.       . ranitidine (ZANTAC) 150 MG tablet Take 1 tablet (150 mg total) by mouth once.  1 tablet  0  . Thiamine HCl (VITAMIN B-1 PO) Take by mouth daily.        [name] amLODipine (NORVASC) 5 MG tablet Take 1 tablet (5 mg total) by mouth daily.  90 tablet  3   No current facility-administered medications for this visit.    No Known Allergies  Patient Active Problem List  Diagnosis  . Pure hypercholesterolemia  . Benign hypertensive heart disease without heart failure  . Osteoarthritis  . Myasthenia gravis    History  Smoking status  . Never Smoker   Smokeless tobacco  . Not on file    History  Alcohol Use No    Family History  Problem Relation Age of Onset  . Heart attack Father   . Cancer Sister   . Cancer Brother   . Cancer Sister   .  Cancer Sister     Review of Systems: Constitutional: no fever chills diaphoresis or fatigue or change in weight.  Head and neck: no hearing loss, no epistaxis, no photophobia or visual disturbance. Respiratory: No cough, shortness of breath or wheezing. Cardiovascular: No chest pain peripheral edema, palpitations. Gastrointestinal: No abdominal distention, no abdominal pain, no change in bowel habits hematochezia or melena. Genitourinary: No dysuria, no frequency, no urgency, no nocturia. Musculoskeletal:No arthralgias, no back pain, no gait disturbance or myalgias. Neurological: No dizziness, no headaches, no numbness, no seizures, no syncope, no weakness, no tremors. Hematologic: No lymphadenopathy, no easy bruising. Psychiatric: No confusion, no hallucinations, no sleep disturbance.    Physical Exam: Filed Vitals:   01/13/13 0900  BP: 153/100  Pulse: 83   the general appearance reveals a well-developed well-nourished woman in no distress.The head and neck exam reveals pupils equal  and reactive.  Extraocular movements are full.  There is no scleral icterus.  The mouth and pharynx are normal.  The neck is supple.  The carotids reveal no bruits.  The jugular venous pressure is normal.  The  thyroid is not enlarged.  There is no lymphadenopathy.  The chest is clear to percussion and auscultation.  There are no rales or rhonchi.  Expansion of the chest is symmetrical.  The precordium is quiet.  The first heart sound is normal.  The second heart sound is physiologically split.  There is no murmur gallop rub or click.  There is no abnormal lift or heave.  The abdomen is soft and nontender.  The bowel sounds are normal.  The liver and spleen are not enlarged.  There are no abdominal masses.  There are no abdominal bruits.  Extremities reveal good pedal pulses.  There is no phlebitis or edema.  There is no cyanosis or clubbing.  Strength is normal and symmetrical in all extremities.  There is no lateralizing weakness.  There are no sensory deficits.  The skin is warm and dry.  There is no rash.     Assessment / Plan: For blood pressure control she will continue to avoid salty foods and we will add amlodipine 5 mg one daily to her regimen.  Her husband wondered whether the patient should also see a neurosurgeon about her spinal stenosis and her back discomfort.  He would like possibly to have her seen by Dr. Danielle Dess.  He will discuss this with Dr. Darrelyn Hillock at their next appointment in several weeks. Recheck here in 3 months for followup office visit lipid panel hepatic function panel and basal metabolic panel

## 2013-01-13 NOTE — Assessment & Plan Note (Signed)
The blood pressure is running high.  We'll add amlodipine 5 mg one daily for an additional blood pressure control.

## 2013-01-20 DIAGNOSIS — M48061 Spinal stenosis, lumbar region without neurogenic claudication: Secondary | ICD-10-CM | POA: Diagnosis not present

## 2013-01-21 ENCOUNTER — Encounter (HOSPITAL_COMMUNITY): Payer: Self-pay | Admitting: Pharmacy Technician

## 2013-01-21 ENCOUNTER — Encounter (HOSPITAL_COMMUNITY): Payer: Self-pay

## 2013-01-21 ENCOUNTER — Ambulatory Visit (HOSPITAL_COMMUNITY)
Admission: RE | Admit: 2013-01-21 | Discharge: 2013-01-21 | Disposition: A | Discharge status: Home / Self Care | Payer: Medicare Other | Source: Ambulatory Visit | Attending: Surgical | Admitting: Surgical

## 2013-01-21 ENCOUNTER — Ambulatory Visit (HOSPITAL_COMMUNITY)
Admission: RE | Admit: 2013-01-21 | Discharge: 2013-01-21 | Disposition: A | Discharge status: Home / Self Care | Payer: Medicare Other | Source: Ambulatory Visit | Attending: Orthopedic Surgery | Admitting: Orthopedic Surgery

## 2013-01-21 ENCOUNTER — Encounter (HOSPITAL_COMMUNITY)
Admission: RE | Admit: 2013-01-21 | Discharge: 2013-01-21 | Disposition: A | Discharge status: Home / Self Care | Payer: Medicare Other | Source: Ambulatory Visit | Attending: Orthopedic Surgery | Admitting: Orthopedic Surgery

## 2013-01-21 DIAGNOSIS — M47817 Spondylosis without myelopathy or radiculopathy, lumbosacral region: Secondary | ICD-10-CM | POA: Insufficient documentation

## 2013-01-21 DIAGNOSIS — M5137 Other intervertebral disc degeneration, lumbosacral region: Secondary | ICD-10-CM | POA: Diagnosis not present

## 2013-01-21 DIAGNOSIS — Z01818 Encounter for other preprocedural examination: Secondary | ICD-10-CM | POA: Diagnosis not present

## 2013-01-21 DIAGNOSIS — M4120 Other idiopathic scoliosis, site unspecified: Secondary | ICD-10-CM | POA: Diagnosis not present

## 2013-01-21 DIAGNOSIS — Z01812 Encounter for preprocedural laboratory examination: Secondary | ICD-10-CM | POA: Diagnosis not present

## 2013-01-21 HISTORY — DX: Headache: R51

## 2013-01-21 HISTORY — DX: Unspecified macular degeneration: H35.30

## 2013-01-21 HISTORY — DX: Myasthenia gravis without (acute) exacerbation: G70.00

## 2013-01-21 HISTORY — DX: Gastro-esophageal reflux disease without esophagitis: K21.9

## 2013-01-21 HISTORY — DX: Reserved for inherently not codable concepts without codable children: IMO0001

## 2013-01-21 HISTORY — DX: Unspecified osteoarthritis, unspecified site: M19.90

## 2013-01-21 HISTORY — DX: Malignant (primary) neoplasm, unspecified: C80.1

## 2013-01-21 LAB — URINALYSIS, ROUTINE W REFLEX MICROSCOPIC
Bilirubin Urine: NEGATIVE
Glucose, UA: NEGATIVE mg/dL
Hgb urine dipstick: NEGATIVE
Ketones, ur: NEGATIVE mg/dL
Nitrite: NEGATIVE
Protein, ur: NEGATIVE mg/dL
Specific Gravity, Urine: 1.033 — ABNORMAL HIGH (ref 1.005–1.030)
Urobilinogen, UA: 0.2 mg/dL (ref 0.0–1.0)
pH: 5 (ref 5.0–8.0)

## 2013-01-21 LAB — SURGICAL PCR SCREEN: Staphylococcus aureus: NEGATIVE

## 2013-01-21 LAB — COMPREHENSIVE METABOLIC PANEL
ALT: 20 U/L (ref 0–35)
AST: 14 U/L (ref 0–37)
Albumin: 4.3 g/dL (ref 3.5–5.2)
Alkaline Phosphatase: 81 U/L (ref 39–117)
BUN: 24 mg/dL — ABNORMAL HIGH (ref 6–23)
CO2: 28 mEq/L (ref 19–32)
Calcium: 10.1 mg/dL (ref 8.4–10.5)
Chloride: 103 mEq/L (ref 96–112)
Creatinine, Ser: 0.66 mg/dL (ref 0.50–1.10)
GFR calc Af Amer: >90 mL/min (ref >90)
GFR calc non Af Amer: 78 mL/min — ABNORMAL LOW (ref >90)
Glucose, Bld: 111 mg/dL — ABNORMAL HIGH (ref 70–99)
Potassium: 4.4 mEq/L (ref 3.5–5.1)
Sodium: 143 mEq/L (ref 135–145)
Total Bilirubin: 0.6 mg/dL (ref 0.3–1.2)
Total Protein: 7.5 g/dL (ref 6.0–8.3)

## 2013-01-21 LAB — CBC
HCT: 43.9 % (ref 36.0–46.0)
Hemoglobin: 14.2 g/dL (ref 12.0–15.0)
MCHC: 32.3 g/dL (ref 30.0–36.0)
MCV: 96.3 fL (ref 78.0–100.0)

## 2013-01-21 LAB — APTT: aPTT: 30 seconds (ref 24–37)

## 2013-01-21 LAB — PROTIME-INR
INR: 1.01 (ref 0.00–1.49)
Prothrombin Time: 13.2 seconds (ref 11.6–15.2)

## 2013-01-21 LAB — URINE MICROSCOPIC-ADD ON

## 2013-01-21 NOTE — Progress Notes (Signed)
No old EKG found in system.  Called and spoke with husband regarding any ekg done at MD office.  Husband stated patient has not had EKG done in years.  Patient denied any complaints of chest pain at preop visit.  EKG shown to Dr Acey Lav( anesthesia).  No new orders given.

## 2013-01-21 NOTE — Progress Notes (Signed)
Requested and received last office visit note from Dr Alphonzo Dublin from [facility]- 04/20/12- note on chart - treats myasthenia gravis.

## 2013-01-21 NOTE — Progress Notes (Signed)
CMP results routed to Dr Darrelyn Hillock by [facility] fax.

## 2013-01-21 NOTE — Progress Notes (Signed)
Dr Acey Lav made aware of thoracic aneurysm that was on CT of Chest done 01/28/12 by Dr Alphonzo Dublin.  No mention of any followup of this aneurysm in notes of Dr Patty Sermons ( PCP) .  Dr Acey Lav gave order for preop nurse to call office of Dr Patty Sermons in am of 01/22/13 and to have thoracic aneurysm followed up prior to surgery.  Nurse attempted to call office of Dr Patty Sermons and office was closed for today.  Will call first thing on 01/22/13.

## 2013-01-21 NOTE — Progress Notes (Signed)
Last office visit with Dr Patty Sermons 01/13/13 CT of chest in [facility] 01/28/12 [facility] - repeat CXR done 01/21/13

## 2013-01-21 NOTE — Patient Instructions (Addendum)
Tamara Gregory  01/21/2013   Your procedure is scheduled on:  01/26/13   Report to [facility] at   115pm  Call this number if you have problems the morning of surgery: [phone]   Remember:   Do not eat food after midnite.  May have clear liquids until 0900am then npo.     Take these medicines the morning of surgery with A SIP OF WATER:    Do not wear jewelry, make-up or nail polish.  Do not wear lotions, powders, or perfumes.   Do not shave 48 hours prior to surgery.   Do not bring valuables to the hospital.  Contacts, dentures or bridgework may not be worn into surgery.  Leave suitcase in the car. After surgery it may be brought to your room.  For patients admitted to the hospital, checkout time is 11:00 AM the day of  discharge.        SEE CHG INSTRUCTION SHEET    Please read over the following fact sheets that you were given: MRSA Information, coughing and deep breathing exercises, leg exercises, Incentive Spirometry Fact Sheet                Failure to comply with these instructions may result in cancellation of your surgery.   Tamara Gregory  02/01/2013   Your procedure is scheduled on:    Report to [facility] at  AM.  Call this number if you have problems the morning of surgery: [phone]   Remember:   Do not eat food or drink liquids after midnight.   Take these medicines the morning of surgery with A SIP OF WATER:    Do not wear jewelry, make-up or nail polish.  Do not wear lotions, powders, or perfumes. You may wear deodorant.  Do not shave 48 hours prior to surgery. Men may shave face and neck.  Do not bring valuables to the hospital.  Contacts, dentures or bridgework may not be worn into surgery.  Leave suitcase in the car. After surgery it may be brought to your room.  For patients admitted to the hospital, checkout time is 11:00 AM the day of  discharge.   Patients discharged the day of surgery will not be allowed to drive   home.  Name and phone number of your driver:   SEE CHG INSTRUCTION SHEET    Please read over the following fact sheets that you were given: MRSA Information, coughing and deep breathing exercises, leg exercises               Failure to comply with these instructions may result in cancellation of your surgery.                Patient Signature ____________________________              Nurse Signature _____________________________               Patient Signature ____________________________              Nurse Signature _____________________________

## 2013-01-21 NOTE — Progress Notes (Signed)
Please note B/P at preop appointment was 208/112.  Recheck  Was 154/97.  Patient voiced no complaints except for pain.  Patient was seen by Dr Patty Sermons for hypertension on 01/13/13.  Note is in [facility] related to that visit.  Wanted to provide you with this info along with CT chest result faxed to you by [facility].  Thanks.

## 2013-01-22 ENCOUNTER — Encounter: Payer: Self-pay | Admitting: Cardiology

## 2013-01-22 ENCOUNTER — Ambulatory Visit (INDEPENDENT_AMBULATORY_CARE_PROVIDER_SITE_OTHER): Payer: Medicare Other | Admitting: Cardiology

## 2013-01-22 ENCOUNTER — Telehealth: Payer: Self-pay | Admitting: *Deleted

## 2013-01-22 ENCOUNTER — Ambulatory Visit (INDEPENDENT_AMBULATORY_CARE_PROVIDER_SITE_OTHER)
Admission: RE | Admit: 2013-01-22 | Discharge: 2013-01-22 | Disposition: A | Discharge status: Home / Self Care | Payer: Medicare Other | Source: Ambulatory Visit | Attending: Cardiology | Admitting: Cardiology

## 2013-01-22 VITALS — BP 140/80 | HR 74 | Ht 62.0 in | Wt 155.0 lb

## 2013-01-22 DIAGNOSIS — E7800 Pure hypercholesterolemia, unspecified: Secondary | ICD-10-CM | POA: Diagnosis not present

## 2013-01-22 DIAGNOSIS — I119 Hypertensive heart disease without heart failure: Secondary | ICD-10-CM

## 2013-01-22 DIAGNOSIS — I712 Thoracic aortic aneurysm, without rupture: Secondary | ICD-10-CM | POA: Diagnosis not present

## 2013-01-22 DIAGNOSIS — I729 Aneurysm of unspecified site: Secondary | ICD-10-CM | POA: Diagnosis not present

## 2013-01-22 DIAGNOSIS — Z01818 Encounter for other preprocedural examination: Secondary | ICD-10-CM

## 2013-01-22 DIAGNOSIS — M1990 Unspecified osteoarthritis, unspecified site: Secondary | ICD-10-CM | POA: Diagnosis not present

## 2013-01-22 MED ORDER — IOHEXOL 350 MG/ML SOLN
100.0000 mL | Freq: Once | INTRAVENOUS | Status: AC | PRN
  Administered 2013-01-22: 100 mL via INTRAVENOUS

## 2013-01-22 NOTE — Telephone Encounter (Signed)
Spoke with husband and patient will come today for CT of chest and ov with  Dr. 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   Cassell Clement M.D.   ----- Message -----   From: Monia Pouch, RN   Sent: 01/22/2013 9:17 AM   To: Cassell Clement, MD   Subject: Inpatient Notes                      Attached Notes     Author: Monia Pouch, RN Service: (none) Author Type: Registered Nurse    Filed: 01/21/2013 5:25 PM Note Time: 01/21/2013 5:22 PM Note Type: Progress Notes         Dr Acey Lav made aware of thoracic aneurysm that was on CT of Chest done 01/28/12 by Dr Alphonzo Dublin. No mention of any followup of this aneurysm in notes of Dr Patty Sermons ( PCP) . Dr Acey Lav gave order for preop nurse to call office of Dr Patty Sermons in am of 01/22/13 and to have thoracic aneurysm followed up prior to surgery. Nurse attempted to call office of Dr Patty Sermons and office was closed for today. Will call first thing on 01/22/13.

## 2013-01-22 NOTE — Assessment & Plan Note (Signed)
Her blood pressure today is 140/80 and she will continue current therapy with Hyzaar and amlodipine.

## 2013-01-22 NOTE — Progress Notes (Addendum)
Juliette Alcide, nurse of Dr Patty Sermons notified of note sent to Inbox of Dr Patty Sermons regarding Dr Acey Lav requests regarding thoracic aneursym noted on 01/28/12.  Also made [facility] aware of blood pressures at preop appointment on 01/21/13.  She stated she would let Dr Patty Sermons be aware of,  Dr Patty Sermons is in office today.  Thoracic aneurysm was noted on Ct of 01/28/12 in [facility].

## 2013-01-22 NOTE — Progress Notes (Signed)
Tamara Gregory Date of Birth:  [birth year] [facility] [address] [place], [place]  [address] [phone]  Fax   [phone]  HPI: This pleasant 79 year old woman is seen for work in followup office visit. She has a past history of hypercholesterolemia and essential hypertension. She has a history of myasthenia gravis and is followed for that at [facility].  She was seen in our office last week.  She has been having intractable low back pain with sciatica and radiation down the left leg.  She has not responded to injections and will need surgery by Dr. Darrelyn Hillock next week.  She went for her preoperative visit with anesthesia and yesterday and review of her records revealed that a year ago at [facility] she had had a CT scan as part of a workup for her myasthenia gravis.  The CT scan had shown a 4.4 cm fusiform ascending aortic aneurysm.  The patient and her husband have not been aware of this finding.  I was not aware of this finding.  Anesthesia contacted [place] and so we had the patient get a repeat CT scan of the chest with contrast today.  Fortunately, the CT scan today showed no change in the size of the 4.4 cm fusiform aneurysm.  The patient has not been having any chest pain to suggest ischemia.  She does have some occasional postprandial chest pain relieved by belching and her CT scan today does show a moderate-sized hiatal hernia.  The patient has labile hypertension and when I saw her last week we added amlodipine 5 mg daily to her medication schedule.  Yesterday at the preoperative visit her blood pressure was initially quite high but had come down considerably by the end of the consultation.  Today her blood pressure here in the office is normal although she is still in severe pain from her back and has had a busy day.   Current Outpatient Prescriptions  Medication Sig Dispense Refill  . amLODipine (NORVASC) 5 MG tablet Take 5 mg by mouth daily before  breakfast.      . aspirin 81 MG tablet Take 81 mg by mouth daily.       . beta carotene 38756 UNIT capsule Take 25,000 Units by mouth daily.       . Calcium Carbonate (CALCIUM 500 PO) Take 1 capsule by mouth daily.       . cholecalciferol (VITAMIN D) 1000 UNITS tablet Take 1,000 Units by mouth daily.       . citalopram (CELEXA) 20 MG tablet Take 20 mg by mouth daily before breakfast.      . folic acid (FOLVITE) 400 MCG tablet Take 400 mcg by mouth daily.      . Glucosamine-Chondroit-Vit C-Mn (GLUCOSAMINE 1500 COMPLEX PO) Take by mouth daily.       [name] losartan-hydrochlorothiazide (HYZAAR) 100-25 MG per tablet Take 1 tablet by mouth daily before breakfast.      . LYSINE PO Take 1 tablet by mouth daily.       . Multiple Vitamin (MULTIVITAMIN) tablet Take 1 tablet by mouth daily.       . Multiple Vitamins-Minerals (PRESERVISION AREDS) CAPS Take 2 capsules by mouth daily.      . Omega-3 Fatty Acids (FISH OIL) 435 MG CAPS Take 1 capsule by mouth daily.      [name] omeprazole (PRILOSEC) 20 MG capsule Take 20 mg by mouth daily.       [name] oxyCODONE-acetaminophen (PERCOCET) 10-325 MG per tablet  Take 1 tablet by mouth every 4 (four) hours as needed for pain.      . potassium chloride (K-DUR) 10 MEQ tablet Take 1 tablet (10 mEq total) by mouth daily.  90 tablet  3  . pravastatin (PRAVACHOL) 40 MG tablet Take 40 mg by mouth daily before breakfast.      . predniSONE (DELTASONE) 10 MG tablet Take 1 tablet by mouth daily before breakfast.       No current facility-administered medications for this visit.    No Known Allergies  Patient Active Problem List  Diagnosis  . Pure hypercholesterolemia  . Benign hypertensive heart disease without heart failure  . Osteoarthritis  . Myasthenia gravis    History  Smoking status  . Never Smoker   Smokeless tobacco  . Never Used    History  Alcohol Use No    Family History  Problem Relation Age of Onset  . Heart attack Father   . Cancer Sister   . Cancer  Brother   . Cancer Sister   . Cancer Sister     Review of Systems: The patient denies any heat or cold intolerance.  No weight gain or weight loss.  The patient denies headaches or blurry vision.  There is no cough or sputum production.  The patient denies dizziness.  There is no hematuria or hematochezia.  The patient denies any muscle aches or arthritis.  The patient denies any rash.  The patient denies frequent falling or instability.  There is no history of depression or anxiety.  All other systems were reviewed and are negative.   Physical Exam: Filed Vitals:   01/22/13 1606  BP: 140/80  Pulse: 74   the general appearance reveals an elderly woman in a wheelchair.  She is having ongoing moderate to severe low back pain and left  leg pain.Pupils equal and reactive.   Extraocular Movements are full.  There is no scleral icterus.  The mouth and pharynx are normal.  The neck is supple.  The carotids reveal no bruits.  The jugular venous pressure is normal.  The thyroid is not enlarged.  There is no lymphadenopathy.  The chest is clear to percussion and auscultation. There are no rales or rhonchi. Expansion of the chest is symmetrical.  Heart reveals a soft systolic ejection murmur at the baseThe abdomen is soft and nontender. Bowel sounds are normal. The liver and spleen are not enlarged. There Are no abdominal masses. There are no bruits.  Extremities show good pulses and no phlebitis or edema.  EKG today shows normal sinus rhythm and no ischemic changes    Assessment / Plan: The patient is cleared from a cardiac and medical standpoint for her upcoming surgery next week.  Her ascending aortic fusiform aneurysm is unchanged in size since February 2013.  There is no indication for surgery at this point on the aneurysm.  We will continue to emphasize adequate blood pressure control and adequate control of cholesterol.  We will continue to follow the aneurysm on a yearly basis going forward.   The patient will be rechecked here at her regular visit or sooner when necessary

## 2013-01-22 NOTE — Patient Instructions (Addendum)
YOU ARE CLEARED FOR SURGERY  Your physician recommends that you continue on your current medications as directed. Please refer to the Current Medication list given to you today.  KEEP YOU REGULARLY SCHEDULED APPOINTMENT

## 2013-01-22 NOTE — Assessment & Plan Note (Signed)
The patient has a history of hypercholesterolemia and is on statin therapy.  She's not having any myalgias from the statin she was pravastatin 40 mg daily.  Her CT scan of the chest did show plaque in the ascending aorta

## 2013-01-22 NOTE — Assessment & Plan Note (Signed)
The patient has severe spinal stenosis and sciatic pain and will undergo surgery on February 25 at [facility] by Dr. Darrelyn Hillock.

## 2013-01-23 NOTE — H&P (Signed)
Tamara Gregory is an 79 y.o. female.   Chief Complaint: back pain HPI: The patient is a 79 year old female who presents with back pain. The patient reports low back symptoms including pain, pain with extension and lt leg electric pain which began 1 month ago without any known injury. Symptoms are reported to be located in the left low back and Symptoms include pain, burning and weakness. The pain radiates to the left buttock, left posterior thigh and left lower leg. The patient describes the pain as burning, aching and throbbing. She has tried Ibuprofen for the pain which helps some. She states she cannot sleep. She states this pain started when she bent over and felt a pain that ran down her lt. Leg. She had a CT myelogram which showed that she has multi-level spinal stenosis, especially at L3-4 and L4-5. She has mild stenosis at L2-L3. She had an epidural block there at L4-5 on the left, and she had total relief for a day only.   Past Medical History  Diagnosis Date  . Hypertension   . Hyperlipidemia   . Depression   . History of migraines   . Vitamin D deficiency   . GERD (gastroesophageal reflux disease)   . Headache   . Arthritis   . Cancer     hx of skin cancer   . Macular degeneration   . Myasthenia gravis   . Thoracic aneurysm     noted on 01/28/12 CT of chest report     Past Surgical History  Procedure Laterality Date  . Cholecystectomy    . US echocardiography  05/18/2007    EF 55-60%  . Abdominal hysterectomy    . Tonsillectomy      Family History  Problem Relation Age of Onset  . Heart attack Father   . Cancer Sister   . Cancer Brother   . Cancer Sister   . Cancer Sister    Social History:  reports that she has never smoked. She has never used smokeless tobacco. She reports that she does not drink alcohol or use illicit drugs.  Allergies: No Known Allergies   Current outpatient prescriptions amLODipine (NORVASC) 5 MG tablet, Take 5 mg by mouth daily before  breakfast., Disp: , Rfl: ;  aspirin 81 MG tablet, Take 81 mg by mouth daily. , Disp: , Rfl: ;   beta carotene 16109 UNIT capsule, Take 25,000 Units by mouth daily. , Disp: , Rfl: ;  Calcium Carbonate (CALCIUM 500 PO), Take 1 capsule by mouth daily. , Disp: , Rfl:  cholecalciferol (VITAMIN D) 1000 UNITS tablet, Take 1,000 Units by mouth daily. , Disp: , Rfl: ;  citalopram (CELEXA) 20 MG tablet, Take 20 mg by mouth daily before breakfast., Disp: , Rfl: ;   folic acid (FOLVITE) 400 MCG tablet, Take 400 mcg by mouth daily., Disp: , Rfl: ;  Glucosamine-Chondroit-Vit C-Mn (GLUCOSAMINE 1500 COMPLEX PO), Take by mouth daily. , Disp: , Rfl:  losartan-hydrochlorothiazide (HYZAAR) 100-25 MG per tablet, Take 1 tablet by mouth daily before breakfast., Disp: , Rfl: ;   LYSINE PO, Take 1 tablet by mouth daily. , Disp: , Rfl: ;   Multiple Vitamin (MULTIVITAMIN) tablet, Take 1 tablet by mouth daily. , Disp: , Rfl: ;   Multiple Vitamins-Minerals (PRESERVISION AREDS) CAPS, Take 2 capsules by mouth daily., Disp: , Rfl:  Omega-3 Fatty Acids (FISH OIL) 435 MG CAPS, Take 1 capsule by mouth daily., Disp: , Rfl: ;  omeprazole (PRILOSEC) 20 MG capsule,  Take 20 mg by mouth daily. , Disp: , Rfl: ;  oxyCODONE-acetaminophen (PERCOCET) 10-325 MG per tablet, Take 1 tablet by mouth every 4 (four) hours as needed for pain., Disp: , Rfl: ;   potassium chloride (K-DUR) 10 MEQ tablet, Take 1 tablet (10 mEq total) by mouth daily., Disp: 90 tablet, Rfl: 3 pravastatin (PRAVACHOL) 40 MG tablet, Take 40 mg by mouth daily before breakfast., Disp: , Rfl: ;  predniSONE (DELTASONE) 10 MG tablet, Take 1 tablet by mouth daily before breakfast., Disp: , Rfl:   Dg Chest 2 View  01/21/2013  *RADIOLOGY REPORT*  Clinical Data: Preoperative films.  Patient for lumbar surgery.  CHEST - 2 VIEW  Comparison: CT chest 01/28/2012.  Findings: Lungs are clear.  There is cardiomegaly.  The aorta is tortuous.  No pneumothorax or pleural effusion.  IMPRESSION:  Cardiomegaly without acute disease.   Original Report Authenticated By: Holley Dexter, M.D.    Dg Lumbar Spine 2-3 Views  01/21/2013  *RADIOLOGY REPORT*  Clinical Data: Preoperative films.  Patient for lumbar surgery.  LUMBAR SPINE - 2-3 VIEW  Comparison: Post-myelogram CT scan 12/31/2012.  Findings: Vertebral body height is maintained.  Convex left scoliosis is identified.  There is marked loss of disc space height at L3-4 and L4-5.  Lower lumbar facet degenerative disease is also identified.  IMPRESSION:  1.  No acute finding. 2.  Scoliosis and degenerative disease.   Original Report Authenticated By: Holley Dexter, M.D.    Ct Angio Chest W/cm &/or Wo Cm  01/22/2013  *RADIOLOGY REPORT*  Clinical Data: Evaluate aneurysm.  Patient having back surgery on Tuesday.  CT ANGIOGRAPHY CHEST  Technique:  Multidetector CT imaging of the chest using the standard protocol during bolus administration of intravenous contrast. Multiplanar reconstructed images including MIPs were obtained and reviewed to evaluate the vascular anatomy.  Contrast: OMNIPAQUE IOHEXOL 350 MG/ML SOLN  Comparison: 01/28/2012.  Findings: Ascending aortic aneurysm measures 44 mm.  The arch measures 4 cm with ragged mural plaque with ulcerations.  There is no dissection or acute aortic abnormality.  Descending thoracic aorta demonstrates atherosclerosis with mural plaque and calcification.  There is a moderate hiatal hernia present.  There is no pericardial effusion.  Incidental imaging of the upper abdomen demonstrates cholecystectomy.  There is post cholecystectomy, mild duct dilation.  Thoracic spondylosis is present.  There is respiratory motion on the examination.  The lungs show no airspace disease. Mild bilateral pleural apical scarring.  No axillary, mediastinal, or hilar adenopathy.  IMPRESSION: Unchanged thoracic aneurysm with maximal measurement 44 mm.  Severe atherosclerosis with ulcerated plaque along the arch but no acute  abnormalities.   Original Report Authenticated By: Andreas Newport, M.D.     Review of Systems  Constitutional: Negative.   HENT: Negative.  Negative for neck pain.   Eyes: Positive for photophobia. Negative for blurred vision, double vision, pain, discharge and redness.  Respiratory: Negative.   Cardiovascular: Negative.   Gastrointestinal: Negative.   Genitourinary: Negative.   Musculoskeletal: Positive for back pain and joint pain. Negative for myalgias and falls.  Skin: Negative.   Neurological: Negative.   Endo/Heme/Allergies: Negative for environmental allergies and polydipsia. Bruises/bleeds easily.  Psychiatric/Behavioral: Negative.    Vitals Weight: 161 lb Height: 62 in Body Surface Area: 1.79 m Body Mass Index: 29.45 kg/m BP: 172/104 (Sitting, Left Arm, Standard) The patient is advised of her high BP  Physical Exam  Constitutional: She is oriented to person, place, and time. She appears well-developed and well-nourished.  No distress.  HENT:  Head: Normocephalic and atraumatic.  Right Ear: External ear normal.  Left Ear: External ear normal.  Nose: Nose normal.  Mouth/Throat: Oropharynx is clear and moist.  Eyes: Conjunctivae and EOM are normal.  Neck: Normal range of motion. Neck supple. No tracheal deviation present. No thyromegaly present.  Cardiovascular: Normal rate, regular rhythm, normal heart sounds and intact distal pulses.   Respiratory: Effort normal and breath sounds normal. No respiratory distress. She has no wheezes. She exhibits no tenderness.  GI: Soft. She exhibits no distension and no mass. There is no tenderness.  Musculoskeletal:       Right hip: Normal.       Left hip: She exhibits decreased strength. She exhibits normal range of motion.       Right knee: Normal.       Left knee: Normal.       Lumbar back: She exhibits decreased range of motion and pain.       Right lower leg: She exhibits no tenderness and no swelling.       Left  lower leg: She exhibits no tenderness and no swelling.  On examination of her back, she still has pain with motion of her back. She has some weakness of her hip flexors on the left. The quads are intact. Dorsiflexors and plantar flexors of her foot is intact. She has a slightly positive straight leg raising on the left. The calf is fine. No phlebitis. The hip and knee show major problem with hip motion or knee motion.  Lymphadenopathy:    She has no cervical adenopathy.  Neurological: She is alert and oriented to person, place, and time. No sensory deficit.  Skin: No rash noted. She is not diaphoretic. No erythema.  Psychiatric: She has a normal mood and affect. Her behavior is normal.     Assessment/Plan Spinal stenosis, lumbar spine We will need to do a central decompressive lumbar laminectomy at L3-4, L4-5. The possible complications, which are very rare, were explained. She should not eat or drink after midnight the night before surgery.   Sausha Raymond LAUREN 01/23/2013, 10:06 AM

## 2013-01-25 ENCOUNTER — Encounter (HOSPITAL_COMMUNITY): Payer: Self-pay

## 2013-01-25 NOTE — Progress Notes (Signed)
Patient made aware surgery time 1230pm-300pm on 01/26/13.  Patient is to arrive at 1000am.  Patient is aware nothing to eat or drink after midnite.  Patient is to still take Amlodipine and omeprazole with sip of water am of surgery. And percocet if needed with sip of water.  Patient read back instructions and voiced understanding.  Patient stated husband would also call be nurse back.  Number of [phone] given to patient so husband could call nurse back.

## 2013-01-25 NOTE — Progress Notes (Signed)
Patient's husband called and wanted to check on arrival time and time of surgery.  Reviewed those with Dr Aline August of arrival time of 1000 am and surgery time of 1230pm.  Reminded Dr Aline August patient is to be npo after midnite.  Voiced understanding.

## 2013-01-25 NOTE — Progress Notes (Signed)
Pt had CT done 01/22/13.  See results in EPIC and office visit note from Dr Patty Sermons on 01/22/13.

## 2013-01-26 ENCOUNTER — Encounter (HOSPITAL_COMMUNITY): Payer: Self-pay | Admitting: *Deleted

## 2013-01-26 ENCOUNTER — Encounter (HOSPITAL_COMMUNITY): Payer: Self-pay

## 2013-01-26 ENCOUNTER — Encounter (HOSPITAL_COMMUNITY)
Admission: RE | Disposition: A | Discharge status: Home / Self Care | Payer: Self-pay | Source: Ambulatory Visit | Attending: Orthopedic Surgery

## 2013-01-26 ENCOUNTER — Inpatient Hospital Stay (HOSPITAL_COMMUNITY)
Admission: RE | Admit: 2013-01-26 | Discharge: 2013-01-28 | DRG: 552 | Disposition: A | Discharge status: Home / Self Care | Payer: Medicare Other | Source: Ambulatory Visit | Attending: Orthopedic Surgery | Admitting: Orthopedic Surgery

## 2013-01-26 DIAGNOSIS — K219 Gastro-esophageal reflux disease without esophagitis: Secondary | ICD-10-CM | POA: Diagnosis present

## 2013-01-26 DIAGNOSIS — I359 Nonrheumatic aortic valve disorder, unspecified: Secondary | ICD-10-CM

## 2013-01-26 DIAGNOSIS — I712 Thoracic aortic aneurysm, without rupture: Secondary | ICD-10-CM | POA: Diagnosis not present

## 2013-01-26 DIAGNOSIS — R079 Chest pain, unspecified: Secondary | ICD-10-CM

## 2013-01-26 DIAGNOSIS — Z5309 Procedure and treatment not carried out because of other contraindication: Secondary | ICD-10-CM | POA: Diagnosis not present

## 2013-01-26 DIAGNOSIS — F329 Major depressive disorder, single episode, unspecified: Secondary | ICD-10-CM | POA: Diagnosis present

## 2013-01-26 DIAGNOSIS — M48061 Spinal stenosis, lumbar region without neurogenic claudication: Principal | ICD-10-CM | POA: Diagnosis present

## 2013-01-26 DIAGNOSIS — H3530 Unspecified macular degeneration: Secondary | ICD-10-CM | POA: Diagnosis present

## 2013-01-26 DIAGNOSIS — M5126 Other intervertebral disc displacement, lumbar region: Secondary | ICD-10-CM

## 2013-01-26 DIAGNOSIS — I10 Essential (primary) hypertension: Secondary | ICD-10-CM | POA: Diagnosis not present

## 2013-01-26 DIAGNOSIS — F3289 Other specified depressive episodes: Secondary | ICD-10-CM | POA: Diagnosis present

## 2013-01-26 DIAGNOSIS — E785 Hyperlipidemia, unspecified: Secondary | ICD-10-CM | POA: Diagnosis present

## 2013-01-26 DIAGNOSIS — R599 Enlarged lymph nodes, unspecified: Secondary | ICD-10-CM | POA: Diagnosis present

## 2013-01-26 DIAGNOSIS — G7000 Myasthenia gravis without (acute) exacerbation: Secondary | ICD-10-CM | POA: Diagnosis present

## 2013-01-26 DIAGNOSIS — Z79899 Other long term (current) drug therapy: Secondary | ICD-10-CM

## 2013-01-26 HISTORY — DX: Spinal stenosis, site unspecified: M48.00

## 2013-01-26 LAB — TROPONIN I: Troponin I: <0.3 ng/mL (ref <0.30)

## 2013-01-26 LAB — CK TOTAL AND CKMB (NOT AT ARMC): Total CK: 42 U/L (ref 7–177)

## 2013-01-26 SURGERY — LUMBAR LAMINECTOMY/DECOMPRESSION MICRODISCECTOMY 2 LEVELS
Anesthesia: General

## 2013-01-26 MED ORDER — SIMVASTATIN 20 MG PO TABS
20.0000 mg | ORAL_TABLET | Freq: Every day | ORAL | Status: DC
  Administered 2013-01-26 – 2013-01-27 (×2): 20 mg via ORAL
  Filled 2013-01-26 (×3): qty 1

## 2013-01-26 MED ORDER — POTASSIUM CHLORIDE ER 10 MEQ PO TBCR
10.0000 meq | EXTENDED_RELEASE_TABLET | Freq: Every day | ORAL | Status: DC
  Administered 2013-01-27 – 2013-01-28 (×2): 10 meq via ORAL
  Filled 2013-01-26 (×2): qty 1

## 2013-01-26 MED ORDER — ASPIRIN EC 81 MG PO TBEC
81.0000 mg | DELAYED_RELEASE_TABLET | Freq: Every day | ORAL | Status: DC
  Administered 2013-01-27 – 2013-01-28 (×2): 81 mg via ORAL
  Filled 2013-01-26 (×2): qty 1

## 2013-01-26 MED ORDER — CEFAZOLIN SODIUM-DEXTROSE 2-3 GM-% IV SOLR: 2.0000 g | INTRAVENOUS | Status: DC

## 2013-01-26 MED ORDER — NITROGLYCERIN 0.4 MG SL SUBL
0.4000 mg | SUBLINGUAL_TABLET | SUBLINGUAL | Status: DC | PRN
  Administered 2013-01-26: 0.4 mg via SUBLINGUAL

## 2013-01-26 MED ORDER — OXYCODONE-ACETAMINOPHEN 5-325 MG PO TABS
1.0000 | ORAL_TABLET | ORAL | Status: DC | PRN
  Administered 2013-01-26 – 2013-01-28 (×6): 1 via ORAL
  Filled 2013-01-26 (×7): qty 1

## 2013-01-26 MED ORDER — HYDROCHLOROTHIAZIDE 25 MG PO TABS
25.0000 mg | ORAL_TABLET | Freq: Every day | ORAL | Status: DC
  Administered 2013-01-27 – 2013-01-28 (×2): 25 mg via ORAL
  Filled 2013-01-26 (×2): qty 1

## 2013-01-26 MED ORDER — LABETALOL HCL 5 MG/ML IV SOLN
7.5000 mg | Freq: Once | INTRAVENOUS | Status: AC
  Administered 2013-01-26: 7.5 mg via INTRAVENOUS
  Filled 2013-01-26: qty 4

## 2013-01-26 MED ORDER — METOPROLOL TARTRATE 25 MG PO TABS
25.0000 mg | ORAL_TABLET | Freq: Two times a day (BID) | ORAL | Status: DC
  Administered 2013-01-26 – 2013-01-27 (×2): 25 mg via ORAL
  Filled 2013-01-26 (×3): qty 1

## 2013-01-26 MED ORDER — LACTATED RINGERS IV SOLN
INTRAVENOUS | Status: DC
  Administered 2013-01-26: 1000 mL via INTRAVENOUS

## 2013-01-26 MED ORDER — LOSARTAN POTASSIUM-HCTZ 100-25 MG PO TABS: 1.0000 | ORAL_TABLET | Freq: Every day | ORAL | Status: DC

## 2013-01-26 MED ORDER — LOSARTAN POTASSIUM 50 MG PO TABS
100.0000 mg | ORAL_TABLET | Freq: Every day | ORAL | Status: DC
  Administered 2013-01-27 – 2013-01-28 (×2): 100 mg via ORAL
  Filled 2013-01-26 (×2): qty 2

## 2013-01-26 MED ORDER — NITROGLYCERIN 0.4 MG SL SUBL: 0.4000 mg | SUBLINGUAL_TABLET | SUBLINGUAL | Status: DC | PRN

## 2013-01-26 MED ORDER — ASPIRIN 81 MG PO TABS: 81.0000 mg | ORAL_TABLET | Freq: Every day | ORAL | Status: DC

## 2013-01-26 MED ORDER — REGADENOSON 0.4 MG/5ML IV SOLN
0.4000 mg | Freq: Once | INTRAVENOUS | Status: AC
  Administered 2013-01-27: 0.4 mg via INTRAVENOUS
  Filled 2013-01-26: qty 5

## 2013-01-26 MED ORDER — AMLODIPINE BESYLATE 5 MG PO TABS
5.0000 mg | ORAL_TABLET | Freq: Every day | ORAL | Status: DC
  Administered 2013-01-27: 5 mg via ORAL
  Filled 2013-01-26: qty 1

## 2013-01-26 NOTE — H&P (Signed)
Tamara Gregory is an 79 y.o. female.   Chief Complaint: Severe left leg pain. HPI: She has severe Spinal stenosis with Left Leg Pain.She developed a sudden onset of Chest Pain in the Pre-Op holding area. 12-Lead EKG was unchanged since 5-days ago.Her BP was 244/93 prior to treatment.Cardiology will consult when admitted.  Past Medical History  Diagnosis Date  . Hypertension   . Hyperlipidemia   . Depression   . History of migraines   . Vitamin D deficiency   . GERD (gastroesophageal reflux disease)   . Headache   . Arthritis   . Cancer     hx of skin cancer   . Macular degeneration   . Myasthenia gravis   . Thoracic aneurysm     noted on 01/28/12 CT of chest report   . Thoracic aneurysm     see CT done 01/28/12 and 01/22/13 in EPIC.   . Myasthenia gravis     Past Surgical History  Procedure Laterality Date  . Cholecystectomy    . US echocardiography  05/18/2007    EF 55-60%  . Abdominal hysterectomy    . Tonsillectomy      Family History  Problem Relation Age of Onset  . Heart attack Father   . Cancer Sister   . Cancer Brother   . Cancer Sister   . Cancer Sister    Social History:  reports that she has never smoked. She has never used smokeless tobacco. She reports that she does not drink alcohol or use illicit drugs.  Allergies: No Known Allergies  Medications Prior to Admission  Medication Sig Dispense Refill  . amLODipine (NORVASC) 5 MG tablet Take 5 mg by mouth daily before breakfast.      . folic acid (FOLVITE) 400 MCG tablet Take 400 mcg by mouth daily.      . Glucosamine-Chondroit-Vit C-Mn (GLUCOSAMINE 1500 COMPLEX PO) Take by mouth daily.       [name] losartan-hydrochlorothiazide (HYZAAR) 100-25 MG per tablet Take 1 tablet by mouth daily before breakfast.      . oxyCODONE-acetaminophen (PERCOCET) 10-325 MG per tablet Take 1 tablet by mouth every 4 (four) hours as needed for pain.      . pravastatin (PRAVACHOL) 40 MG tablet Take 40 mg by mouth daily before  breakfast.      . predniSONE (DELTASONE) 10 MG tablet Take 1 tablet by mouth daily before breakfast.      . aspirin 81 MG tablet Take 81 mg by mouth daily.       . beta carotene 30865 UNIT capsule Take 25,000 Units by mouth daily.       . Calcium Carbonate (CALCIUM 500 PO) Take 1 capsule by mouth daily.       . cholecalciferol (VITAMIN D) 1000 UNITS tablet Take 1,000 Units by mouth daily.       . citalopram (CELEXA) 20 MG tablet Take 20 mg by mouth daily before breakfast.      . LYSINE PO Take 1 tablet by mouth daily.       . Multiple Vitamin (MULTIVITAMIN) tablet Take 1 tablet by mouth daily.       . Multiple Vitamins-Minerals (PRESERVISION AREDS) CAPS Take 2 capsules by mouth daily.      . Omega-3 Fatty Acids (FISH OIL) 435 MG CAPS Take 1 capsule by mouth daily.      [name] omeprazole (PRILOSEC) 20 MG capsule Take 20 mg by mouth daily.       . potassium chloride (K-DUR)  10 MEQ tablet Take 1 tablet (10 mEq total) by mouth daily.  90 tablet  3    No results found for this or any previous visit (from the past 48 hour(s)). No results found.  Review of Systems  Constitutional: Negative.   HENT: Negative.   Eyes: Negative.   Respiratory: Negative.   Cardiovascular: Positive for chest pain.  Gastrointestinal: Negative.   Genitourinary: Negative.   Musculoskeletal: Positive for back pain.  Skin: Negative.   Neurological: Negative.   Endo/Heme/Allergies: Negative.   Psychiatric/Behavioral: Negative.     Blood pressure 154/104, pulse 79, temperature 98.6 F (37 C), temperature source Oral, resp. rate 18, SpO2 93.00%. Physical Exam  Constitutional: She appears well-developed. She appears distressed.  HENT:  Head: Normocephalic.  Eyes: Pupils are equal, round, and reactive to light.  Neck: Normal range of motion.  Cardiovascular: Normal heart sounds and intact distal pulses.   Respiratory: Effort normal and breath sounds normal.  GI: Soft.  Musculoskeletal:  Severe pain in Left leg and  a posiyive Straight Leg test on the left.  Skin: Skin is warm.  Psychiatric: She has a normal mood and affect.     Assessment/Plan Will admit and obtain a cardiology consult because of sudden onset of left leg pain. Dr. Patty Sermons was called and recommended the admission.  Tamara Gregory A 01/26/2013, 12:59 PM

## 2013-01-26 NOTE — Anesthesia Preprocedure Evaluation (Addendum)
Anesthesia Evaluation  Patient identified by MRN, date of birth, ID band Patient awake    Reviewed: Allergy & Precautions, H&P , NPO status , Patient's Chart, lab work & pertinent test results  Airway Mallampati: II TM Distance: >3 FB Neck ROM: Full    Dental no notable dental hx.    Pulmonary neg pulmonary ROS,  breath sounds clear to auscultation  Pulmonary exam normal       Cardiovascular hypertension, Pt. on medications and Pt. on home beta blockers + Peripheral Vascular Disease negative cardio ROS  Rhythm:Regular Rate:Normal  44mm stable thoracic aortic aneurysm.   02-04-13: Underwent cardiac evaluation on 01/27/13 and now presents again for back surgery.  EF 69%  Devaney Segers   Neuro/Psych  Headaches, PSYCHIATRIC DISORDERS Depression  Neuromuscular disease (myesthenia gravis ([place] only))    GI/Hepatic Neg liver ROS, GERD-  ,  Endo/Other  negative endocrine ROS  Renal/GU negative Renal ROS  negative genitourinary   Musculoskeletal negative musculoskeletal ROS (+)   Abdominal   Peds negative pediatric ROS (+)  Hematology negative hematology ROS (+)   Anesthesia Other Findings   Reproductive/Obstetrics negative OB ROS                       Anesthesia Physical Anesthesia Plan  ASA: III  Anesthesia Plan: General   Post-op Pain Management:    Induction: Intravenous  Airway Management Planned: Oral ETT  Additional Equipment:   Intra-op Plan:   Post-operative Plan: Extubation in OR  Informed Consent: I have reviewed the patients History and Physical, chart, labs and discussed the procedure including the risks, benefits and alternatives for the proposed anesthesia with the patient or authorized representative who has indicated his/her understanding and acceptance.   Dental advisory given  Plan Discussed with: CRNA  Anesthesia Plan Comments: (LTA. Avoid relaxants.  Pt c/o chest  pressure and not feeling well in holding room. Never had chest pain before. Not diaphoretic.  Given her history will delay case for cardiac workup. EKG. O2. NTG sl. Hypertensive now with systolic bp>200. Give 7.5 mg labetalol. Dr Darrelyn Hillock spoken with.  02-04-13: Cleared for surgery. Janeka Libman)     Anesthesia Quick Evaluation

## 2013-01-26 NOTE — OR Nursing (Signed)
Patient c/o oxygen mask bothersome. Changed to nasal cannula at 3l/m. sao2 remains at 95-96%.

## 2013-01-26 NOTE — Progress Notes (Signed)
Patient complained of Chest Pain/Tightness at 1215.  Oxygen applied and a 12 Lead EKG preformed. Blood Pressure taken 244/93 Dr Acey Lav MD notified and MD administered Labetalol.   Nitroglycerin given sublingual. Cardiac Labs Ordered by Acey Lav MD.  Surgeon Dr Magdalene Patricia Md notified.  Husband Notified. Patients Chest Pain is Relieved by 1235.  Surgery Canceled, pending patient being admitted to hospital for observation.

## 2013-01-26 NOTE — Progress Notes (Signed)
  Echocardiogram 2D Echocardiogram has been performed.  Jorje Guild 01/26/2013, 3:21 PM

## 2013-01-26 NOTE — Consult Note (Signed)
CARDIOLOGY CONSULT NOTE  Patient ID: Tamara Gregory, MRN: [id number], DOB/AGE: 1925/11/05 79 y.o. Admit date: 01/26/2013 Date of Consult: 01/26/2013  Primary Physician:  Primary Cardiologist: Cassell Clement, MD  Reason for Consultation: chest pain  HPI: 79 y.o. female w/ PMHx significant for HTN, HLD, Thoracic Aneurysm (4.4cm), spinal stenosis, and Myasthenia gravis who presented to [facility] on 01/26/2013 for back surgery and complained of chest pain.  No prior history of heart disease. Has been followed by Dr. Patty Sermons for HTN and HLD and was just recently evaluated on 01/22/13 for preop evaluation for back surgery. Per Dr. Yevonne Pax office note "She went for her preoperative visit with anesthesia and review of her records revealed that a year ago at [facility] she had had a CT scan as part of a workup for her myasthenia gravis. The CT scan had shown a 4.4 cm fusiform ascending aortic aneurysm. The patient and her husband have not been aware of this finding. I was not aware of this finding. Anesthesia contacted [place] and so we had the patient get a repeat CT scan of the chest with contrast today. Fortunately, the CT scan today showed no change in the size of the 4.4 cm fusiform aneurysm. The patient has not been having any chest pain to suggest ischemia. She does have some occasional postprandial chest pain relieved by belching and her CT scan today does show a moderate-sized hiatal hernia. The patient has labile hypertension and when I saw her last week we added amlodipine 5 mg daily to her medication schedule" She was cleared for surgery. She presented today for surgery and while in preop complained of a tightness across her chest. Her BP was checked and noted to be 244/93. She denies radiation or associated sob, nausea, or diaphoresis. The pain lasted ~30 mins and was relieved with NTG. She has never felt this pain before and is unlike her indigestion pain. The surgery was cancelled  and Cardiology asked to assist in evaluation of chest pain.  She is currently chest pain free and her BP has improved. EKG shows NSR with more pronounced TWI in III, aVF, otherwise unchanged from prior EKG. Labs are significant for normal troponin.    Past Medical History  Diagnosis Date  . Hypertension   . Hyperlipidemia   . Depression   . History of migraines   . Vitamin D deficiency   . GERD (gastroesophageal reflux disease)   . Headache   . Arthritis   . Cancer     hx of skin cancer   . Macular degeneration   . Myasthenia gravis   . Thoracic aneurysm     4.4cm; see CT done 01/28/12 and 01/22/13 in [facility].   [name] Spinal stenosis       Surgical History:  Past Surgical History  Procedure Laterality Date  . Cholecystectomy    . US echocardiography  05/18/2007    EF 55-60%  . Abdominal hysterectomy    . Tonsillectomy       Home Meds: Medication Sig  amLODipine (NORVASC) 5 MG tablet Take 5 mg by mouth daily before breakfast.  folic acid (FOLVITE) 400 MCG tablet Take 400 mcg by mouth daily.  Glucosamine-Chondroit-Vit C-Mn (GLUCOSAMINE 1500 COMPLEX PO) Take by mouth daily.   losartan-hydrochlorothiazide (HYZAAR) 100-25 MG per tablet Take 1 tablet by mouth daily before breakfast.  oxyCODONE-acetaminophen (PERCOCET) 10-325 MG per tablet Take 1 tablet by mouth every 4 (four) hours as needed for pain.  pravastatin (PRAVACHOL) 40 MG  tablet Take 40 mg by mouth daily before breakfast.  predniSONE (DELTASONE) 10 MG tablet Take 1 tablet by mouth daily before breakfast.  aspirin 81 MG tablet Take 81 mg by mouth daily.   beta carotene 78295 UNIT capsule Take 25,000 Units by mouth daily.   Calcium Carbonate (CALCIUM 500 PO) Take 1 capsule by mouth daily.   cholecalciferol (VITAMIN D) 1000 UNITS tablet Take 1,000 Units by mouth daily.   citalopram (CELEXA) 20 MG tablet Take 20 mg by mouth daily before breakfast.  LYSINE PO Take 1 tablet by mouth daily.   Multiple Vitamin (MULTIVITAMIN) tablet  Take 1 tablet by mouth daily.   Multiple Vitamins-Minerals (PRESERVISION AREDS) CAPS Take 2 capsules by mouth daily.  Omega-3 Fatty Acids (FISH OIL) 435 MG CAPS Take 1 capsule by mouth daily.  omeprazole (PRILOSEC) 20 MG capsule Take 20 mg by mouth daily.   potassium chloride (K-DUR) 10 MEQ tablet Take 1 tablet (10 mEq total) by mouth daily.   Inpatient Medications:    Allergies: No Known Allergies  History   Social History  . Marital Status: Married    Spouse Name: N/A    Number of Children: N/A  . Years of Education: N/A   Occupational History  . Not on file.   Social History Main Topics  . Smoking status: Never Smoker   . Smokeless tobacco: Never Used  . Alcohol Use: No  . Drug Use: No  . Sexually Active: Not on file   Other Topics Concern  . Not on file   Social History Narrative  . No narrative on file     Family History  Problem Relation Age of Onset  . Heart attack Father   . Cancer Sister   . Cancer Brother   . Cancer Sister   . Cancer Sister      Review of Systems: General: negative for chills, fever, night sweats or weight changes.  Cardiovascular: (+) chest pain; negative for shortness of breath, dyspnea on exertion, edema, orthopnea, palpitations, or paroxysmal nocturnal dyspnea Dermatological: negative for rash Respiratory: negative for cough or wheezing Urologic: negative for hematuria Abdominal: negative for nausea, vomiting, diarrhea, bright red blood per rectum, melena, or hematemesis Neurologic: negative for visual changes, syncope, or dizziness Musculoskeletal: (+) back pain All other systems reviewed and are otherwise negative except as noted above.  Labs:   2013/02/21 1325  CKTOTAL 42  CKMB 2.4  TROPONINI <0.30   Lab Results  Component Value Date   WBC 9.3 01/21/2013   HGB 14.2 01/21/2013   HCT 43.9 01/21/2013   MCV 96.3 01/21/2013   PLT 284 01/21/2013     Recent Labs Lab 01/21/13 1000  NA 143  K 4.4  CL 103  CO2 28  BUN  24*  CREATININE 0.66  CALCIUM 10.1  PROT 7.5  BILITOT 0.6  ALKPHOS 81  ALT 20  AST 14  GLUCOSE 111*   Radiology/Studies:  None     EKG: 02-21-2013 - NSR with more pronounced TWI in III, aVF, otherwise unchanged from prior EKG.  Physical Exam: Blood pressure 179/80, pulse 59, temperature 97.3 F (36.3 C), temperature source Oral, resp. rate 20, height 5\' 2"  (1.575 m), weight 157 lb 3 oz (71.3 kg), SpO2 99.00%. General: Well developed, elderly white female in no acute distress. Head: Normocephalic, atraumatic, sclera non-icteric, no xanthomas, nares are without discharge.  Neck: Supple. Negative for carotid bruits or JVD  Lungs: Clear bilaterally to auscultation without wheezes, rales, or rhonchi. Breathing is  unlabored. Heart: RRR with S1 S2. 2/6 systolic murmur at the base; N rubs, or gallops appreciated. Abdomen: Soft, non-tender, non-distended with normoactive bowel sounds. No rebound/guarding. No obvious abdominal masses or pulsations. Msk:  Strength and tone appear normal for age. Extremities: No clubbing or cyanosis. No edema.  Distal pedal pulses are intact and equal bilaterally. Neuro: Alert and oriented X 3. Moves all extremities spontaneously. Psych:  Responds to questions appropriately with a normal affect.   Assessment and Plan:  79 y.o. female w/ PMHx significant for HTN, HLD, Thoracic Aneurysm (4.4cm), spinal stenosis, and Myasthenia gravis who presented to [facility] on 01/26/2013 for back surgery and complained of chest pain.  1. Precordial Pain 2. Spinal stenosis 3. Hypertension 4. Hyperlipidemia 5. Thoracic Aneurysm 6. Myasthenia Gravis  Patient presented for scheduled lumbar laminectomy for spinal stenosis, however, in preop developed chest pain with typical and atypical features in the setting of marked hypertension. She did not take her oral antihypertensives this morning and her BP was noted to be 244/93. EKG showed subtle TWI in III, aVF. No prior  history of heart disease. Risk factors include age, sex, HTN, and HLD. She is now chest pain free and her BP has improved after SL NTG. No abdominal or back pain. Do not suspect aortic dissection or aneurysm rupture. She will need admission for MI rule out. Will cycle cardiac enzymes, follow EKG, and check an echocardiogram. No heparin unless enzymes turn positive or she develops ST changes on EKG. If enzymes remain normal and there are no acute findings on echo, will plan for [facility] tomorrow morning. If enzymes turn positive or echo shows WMAs or severely reduced EF will consider cardiac catheterization. Add metoprolol 25mg  BID for better BP control. Cont ASA, statin, Norvasc, and Hyzaar.   Signed, HOPE, JESSICA PA-C 01/26/2013, 2:59 PM Agree with plan as noted above.  We will risk stratify her with LexiScan Myoview stress test tomorrow and if normal we will would hope that surgery could be rescheduled for Thursday or Friday.  Physical examination does reveal a soft systolic ejection murmur at the base and we will check an echocardiogram today.  For improved blood pressure control we will and beta blocker and continue her on her ARB, HCTZ and amlodipine.  Will follow with you.

## 2013-01-26 NOTE — Interval H&P Note (Signed)
History and Physical Interval Note:  01/26/2013 12:48 PM  Tamara Gregory  has presented today for surgery, with the diagnosis of Spinal Stenosis  The various methods of treatment have been discussed with the patient and family. After consideration of risks, benefits and other options for treatment, the patient has consented to  Procedure(s): CENTRAL DECOMPRESSION/LUMBAR LAMINECTOMY L3 - L4 AND L4 -L5 2 LEVELS (N/A) as a surgical intervention .  The patient's history has been reviewed, patient examined, no change in status, stable for surgery.  I have reviewed the patient's chart and labs.  Questions were answered to the patient's satisfaction.  Surgery will be postponed because of sudden onset of chest pain and Hypertension.Will notify Dr. Patty Sermons her Cardiologist.   Jacki Cones

## 2013-01-27 ENCOUNTER — Ambulatory Visit (HOSPITAL_COMMUNITY): Payer: Medicare Other

## 2013-01-27 DIAGNOSIS — I712 Thoracic aortic aneurysm, without rupture: Secondary | ICD-10-CM | POA: Diagnosis not present

## 2013-01-27 DIAGNOSIS — R079 Chest pain, unspecified: Secondary | ICD-10-CM

## 2013-01-27 LAB — BASIC METABOLIC PANEL
BUN: 14 mg/dL (ref 6–23)
Chloride: 100 mEq/L (ref 96–112)
GFR calc Af Amer: >90 mL/min (ref >90)
Potassium: 3.4 mEq/L — ABNORMAL LOW (ref 3.5–5.1)
Sodium: 137 mEq/L (ref 135–145)

## 2013-01-27 LAB — TROPONIN I: Troponin I: <0.3 ng/mL (ref <0.30)

## 2013-01-27 MED ORDER — POTASSIUM CHLORIDE CRYS ER 20 MEQ PO TBCR
40.0000 meq | EXTENDED_RELEASE_TABLET | Freq: Once | ORAL | Status: AC
  Administered 2013-01-27: 40 meq via ORAL
  Filled 2013-01-27: qty 2

## 2013-01-27 MED ORDER — TECHNETIUM TC 99M SESTAMIBI GENERIC - CARDIOLITE
10.0000 | Freq: Once | INTRAVENOUS | Status: AC | PRN
  Administered 2013-01-27: 10 via INTRAVENOUS

## 2013-01-27 MED ORDER — ONDANSETRON HCL 4 MG/2ML IJ SOLN: 4.0000 mg | Freq: Four times a day (QID) | INTRAMUSCULAR | Status: DC | PRN

## 2013-01-27 MED ORDER — AMLODIPINE BESYLATE 10 MG PO TABS
10.0000 mg | ORAL_TABLET | Freq: Every day | ORAL | Status: DC
  Administered 2013-01-28: 10 mg via ORAL
  Filled 2013-01-27: qty 1

## 2013-01-27 MED ORDER — TECHNETIUM TC 99M SESTAMIBI - CARDIOLITE
30.0000 | Freq: Once | INTRAVENOUS | Status: AC | PRN
  Administered 2013-01-27: 30 via INTRAVENOUS

## 2013-01-27 MED ORDER — METOPROLOL TARTRATE 12.5 MG HALF TABLET
12.5000 mg | ORAL_TABLET | Freq: Two times a day (BID) | ORAL | Status: DC
  Administered 2013-01-28: 12.5 mg via ORAL
  Filled 2013-01-27 (×3): qty 1

## 2013-01-27 MED ORDER — ONDANSETRON HCL 4 MG/2ML IJ SOLN
4.0000 mg | Freq: Once | INTRAMUSCULAR | Status: AC
  Administered 2013-01-27: 4 mg via INTRAVENOUS

## 2013-01-27 MED ORDER — ZOLPIDEM TARTRATE 5 MG PO TABS
5.0000 mg | ORAL_TABLET | Freq: Every evening | ORAL | Status: DC | PRN
Start: 2013-01-27 — End: 2013-01-28
  Administered 2013-01-27: 5 mg via ORAL
  Filled 2013-01-27: qty 1

## 2013-01-27 NOTE — Progress Notes (Signed)
Patient ID: STARLET GALLENTINE, female   DOB: Apr 02, 1925, 79 y.o.   MRN: [id number]    Subjective:  Denies SSCP, palpitations or Dyspnea Back pain  Objective:  Filed Vitals:   01/26/13 0955 01/26/13 1421 01/26/13 2100 01/27/13 0604  BP: 154/104 179/80 158/73 158/85  Pulse: 79 59 56 54  Temp: 98.6 F (37 C) 97.3 F (36.3 C) 98.4 F (36.9 C) 97.9 F (36.6 C)  TempSrc: Oral Oral Oral Oral  Resp: 18 20 18 18   Height:  5\' 2"  (1.575 m)    Weight:  157 lb 3 oz (71.3 kg)    SpO2: 93% 99% 99% 95%    Intake/Output from previous day:  Intake/Output Summary (Last 24 hours) at 01/27/13 0841 Last data filed at 01/27/13 8295  Gross per 24 hour  Intake    240 ml  Output   1325 ml  Net  -1085 ml    Physical Exam: Affect appropriate Elderly female  HEENT: normal Neck supple with no adenopathy JVP normal no bruits no thyromegaly Lungs clear with no wheezing and good diaphragmatic motion Heart:  S1/S2 SEM  murmur, no rub, gallop or click PMI normal Abdomen: benighn, BS positve, no tenderness, no AAA no bruit.  No HSM or HJR Distal pulses intact with no bruits No edema Neuro non-focal Skin warm and dry No muscular weakness   Lab Results: Basic Metabolic Panel:  Recent Labs  62/13/08 0150  NA 137  K 3.4*  CL 100  CO2 28  GLUCOSE 111*  BUN 14  CREATININE 0.58  CALCIUM 9.3   Cardiac Enzymes:  Recent Labs  01/26/13 1325 01/26/13 1913 01/27/13 0150  CKTOTAL 42  --   --   CKMB 2.4  --   --   TROPONINI <0.30 <0.30 <0.30    Imaging: No results found.  Cardiac Studies:  ECG:  SR no actue ischemic changes   Telemetry:  NSR no arrhythmia  Echo:  EF 55-60% mild AS  Medications:   . amLODipine  5 mg Oral Daily  . aspirin EC  81 mg Oral Daily  . losartan  100 mg Oral Daily   And  . hydrochlorothiazide  25 mg Oral Daily  . metoprolol tartrate  25 mg Oral BID  . potassium chloride  10 mEq Oral Daily  . potassium chloride  40 mEq Oral Once  . regadenoson  0.4 mg  Intravenous Once  . simvastatin  20 mg Oral q1800       Assessment/Plan:  Preop:  No further chest pain r/o for myovue today AS:  Only mild by echo should not be and issue HTN:  Continue ARB beta blocker and HCTZ  Control back pain Chol:  Continue statin  Charlton Haws 01/27/2013, 8:41 AM

## 2013-01-27 NOTE — Progress Notes (Signed)
Got call from CMT saying pt was Tamara Gregory, going in and out of Junctional rhythm. Pt was asymptomatic upon assessment with no complaints or signs of distress. Checked pt's monitor at nurse's station and HR has normalized in 60's and pt in NSR. Will continue to monitor pt. - Christell Faith, RN

## 2013-01-27 NOTE — Progress Notes (Addendum)
Subjective:  Patient reports pain as 2 on 0-10 scale and 4 on 0-10 scale.  Awaiting clearance  for Surgery. I will wait also until next week to make sure she is not having any recurrences.  Objective: Vital signs in last 24 hours: Temp:  [97.7 F (36.5 C)-98.4 F (36.9 C)] 97.7 F (36.5 C) (02/26 1434) Pulse Rate:  [54-95] 60 (02/26 1434) Resp:  [18] 18 (02/26 1434) BP: (96-183)/(54-89) 148/77 mmHg (02/26 1434) SpO2:  [95 %-99 %] 98 % (02/26 1434)  Intake/Output from previous day: 02/25 0701 - 02/26 0700 In: 240 [P.O.:240] Out: 1325 [Urine:1325] Intake/Output this shift: Total I/O In: 0  Out: 200 [Urine:200]  No results found for this basename: HGB,  in the last 72 hours No results found for this basename: WBC, RBC, HCT, PLT,  in the last 72 hours  Recent Labs  01/27/13 0150  NA 137  K 3.4*  CL 100  CO2 28  BUN 14  CREATININE 0.58  GLUCOSE 111*  CALCIUM 9.3   No results found for this basename: LABPT, INR,  in the last 72 hours  Neurologically intact  Assessment/Plan:  Up with therapy.Will reschedule surgery for next week ,providing she is stable doing routine activities.  Yehoshua Vitelli A 01/27/2013, 4:43 PM

## 2013-01-28 ENCOUNTER — Telehealth: Payer: Self-pay | Admitting: Cardiology

## 2013-01-28 DIAGNOSIS — R079 Chest pain, unspecified: Secondary | ICD-10-CM | POA: Diagnosis not present

## 2013-01-28 DIAGNOSIS — E876 Hypokalemia: Secondary | ICD-10-CM

## 2013-01-28 MED ORDER — POTASSIUM CHLORIDE ER 10 MEQ PO TBCR: 10.0000 meq | EXTENDED_RELEASE_TABLET | Freq: Two times a day (BID) | ORAL | Status: DC

## 2013-01-28 MED ORDER — METOPROLOL TARTRATE 25 MG PO TABS: ORAL_TABLET | ORAL | Status: DC

## 2013-01-28 MED ORDER — OXYCODONE-ACETAMINOPHEN 10-325 MG PO TABS: 1.0000 | ORAL_TABLET | ORAL | Status: DC | PRN

## 2013-01-28 NOTE — Discharge Summary (Signed)
Physician Discharge Summary  Patient ID: Tamara Gregory MRN: [id number] DOB/AGE: 77-Mar-1926 77 y.o.  Admit date: 01/26/2013 Discharge date: 01/28/2013  Admission Diagnoses:Spinal Stenosis, Lumbar  Discharge Diagnoses: Spinal Stenosis,Lumbar Active Problems:   * No active hospital problems. *   Discharged Condition: good  Hospital Course: Cardiac Evaluation for Chest Pain and Hypertension.  Consults: cardiology  Significant Diagnostic Studies: nuclear medicine: Cardiac  Treatments: analgesia: acetaminophen w/ codeine and Vicodin  Discharge Exam: Blood pressure 144/75, pulse 62, temperature 98.4 F (36.9 C), temperature source Oral, resp. rate 18, height 5\' 2"  (1.575 m), weight 71.3 kg (157 lb 3 oz), SpO2 98.00%. Extremities: Homans sign is negative, no sign of DVT and Positive Straight Leg Raising on the Left  Disposition:   Discharge Orders   Future Appointments Provider Department Dept Phone   04/07/2013 8:30 AM Cassell Clement, MD [facility] Main Office [place]) [phone]   04/07/2013 8:55 AM [facility] Main Office [place]) [phone]   Future Orders Complete By Expires     Call MD / Call 911  As directed     Comments:      If you experience chest pain or shortness of breath, CALL 911 and be transported to the hospital emergency room.  If you develope a fever above 101 F, pus (white drainage) or increased drainage or redness at the wound, or calf pain, call your surgeon's office.    Discharge instructions  As directed     Comments:      Surgery rescheduled for Thursday February 04, 2013 Aspirin remains discontinued for procedure Activity as tolerated    Increase activity slowly as tolerated  As directed         Medication List    STOP taking these medications       aspirin 81 MG tablet      TAKE these medications       amLODipine 5 MG tablet  Commonly known as:  NORVASC  Take 5 mg by mouth daily before breakfast.     beta  carotene 65784 UNIT capsule  Take 25,000 Units by mouth daily.     CALCIUM 500 PO  Take 1 capsule by mouth daily.     cholecalciferol 1000 UNITS tablet  Commonly known as:  VITAMIN D  Take 1,000 Units by mouth daily.     citalopram 20 MG tablet  Commonly known as:  CELEXA  Take 20 mg by mouth daily before breakfast.     Fish Oil 435 MG Caps  Take 1 capsule by mouth daily.     folic acid 400 MCG tablet  Commonly known as:  FOLVITE  Take 400 mcg by mouth daily.     GLUCOSAMINE 1500 COMPLEX PO  Take by mouth daily.     losartan-hydrochlorothiazide 100-25 MG per tablet  Commonly known as:  HYZAAR  Take 1 tablet by mouth daily before breakfast.     LYSINE PO  Take 1 tablet by mouth daily.     multivitamin tablet  Take 1 tablet by mouth daily.     omeprazole 20 MG capsule  Commonly known as:  PRILOSEC  Take 20 mg by mouth daily.     oxyCODONE-acetaminophen 10-325 MG per tablet  Commonly known as:  PERCOCET  Take 1 tablet by mouth every 4 (four) hours as needed for pain.     potassium chloride 10 MEQ tablet  Commonly known as:  K-DUR  Take 1 tablet (10 mEq total) by mouth daily.  pravastatin 40 MG tablet  Commonly known as:  PRAVACHOL  Take 40 mg by mouth daily before breakfast.     predniSONE 10 MG tablet  Commonly known as:  DELTASONE  Take 1 tablet by mouth daily before breakfast.     PreserVision AREDS Caps  Take 2 capsules by mouth daily.         Signed: Ambry Dix A 01/28/2013, 1:40 PM

## 2013-01-28 NOTE — Progress Notes (Signed)
Subjective: 2 Days Post-Op Procedure(s) (LRB): CENTRAL DECOMPRESSION/LUMBAR LAMINECTOMY L3 - L4 AND L4 -L5 2 LEVELS (N/A) Patient reports pain as 2 on 0-10 scale and 4 on 0-10 scale.    Objective: Vital signs in last 24 hours: Temp:  [97.7 F (36.5 C)-98.4 F (36.9 C)] 98.4 F (36.9 C) (02/27 0549) Pulse Rate:  [56-62] 62 (02/27 0549) Resp:  [18] 18 (02/27 0549) BP: (125-148)/(57-77) 144/75 mmHg (02/27 0549) SpO2:  [97 %-98 %] 98 % (02/27 0549)  Intake/Output from previous day: 02/26 0701 - 02/27 0700 In: 0  Out: 800 [Urine:800] Intake/Output this shift: Total I/O In: 240 [P.O.:240] Out: -   No results found for this basename: HGB,  in the last 72 hours No results found for this basename: WBC, RBC, HCT, PLT,  in the last 72 hours  Recent Labs  01/27/13 0150  NA 137  K 3.4*  CL 100  CO2 28  BUN 14  CREATININE 0.58  GLUCOSE 111*  CALCIUM 9.3   No results found for this basename: LABPT, INR,  in the last 72 hours  Neurologically intact  Assessment/Plan: 2 Days Post-Op Procedure(s) (LRB): CENTRAL DECOMPRESSION/LUMBAR LAMINECTOMY L3 - L4 AND L4 -L5 2 LEVELS (N/A) Discharge home with home health  Nayara Taplin A 01/28/2013, 1:40 PM

## 2013-01-28 NOTE — Progress Notes (Signed)
Patient ID: Tamara Gregory, female   DOB: 09/26/1925, 79 y.o.   MRN: [id number]    Subjective:  Denies SSCP, palpitations or Dyspnea Back pain  Objective:  Filed Vitals:   01/27/13 1335 01/27/13 1434 01/27/13 2145 01/28/13 0549  BP: 107/57 148/77 125/57 144/75  Pulse: 93 60 56 62  Temp:  97.7 F (36.5 C) 98.3 F (36.8 C) 98.4 F (36.9 C)  TempSrc:  Oral Oral Oral  Resp:  18 18 18   Height:      Weight:      SpO2:  98% 97% 98%    Intake/Output from previous day:  Intake/Output Summary (Last 24 hours) at 01/28/13 0805 Last data filed at 01/28/13 0300  Gross per 24 hour  Intake      0 ml  Output    800 ml  Net   -800 ml    Physical Exam: Affect appropriate Elderly female  HEENT: normal Neck supple with no adenopathy JVP normal no bruits no thyromegaly Lungs clear with no wheezing and good diaphragmatic motion Heart:  S1/S2 SEM  murmur, no rub, gallop or click PMI normal Abdomen: benighn, BS positve, no tenderness, no AAA no bruit.  No HSM or HJR Distal pulses intact with no bruits No edema Neuro non-focal Skin warm and dry No muscular weakness   Lab Results: Basic Metabolic Panel:  Recent Labs  40/98/11 0150  NA 137  K 3.4*  CL 100  CO2 28  GLUCOSE 111*  BUN 14  CREATININE 0.58  CALCIUM 9.3   Cardiac Enzymes:  Recent Labs  01/26/13 1325 01/26/13 1913 01/27/13 0150  CKTOTAL 42  --   --   CKMB 2.4  --   --   TROPONINI <0.30 <0.30 <0.30    Imaging: Nm Myocar Multi W/spect W/wall Motion / Ef  01/27/2013  *RADIOLOGY REPORT*  Clinical Data:  Chest pain  MYOCARDIAL IMAGING WITH SPECT (REST AND PHARMACOLOGIC-STRESS) GATED LEFT VENTRICULAR WALL MOTION STUDY LEFT VENTRICULAR EJECTION FRACTION  Technique:  Standard myocardial SPECT imaging was performed after resting intravenous injection of 10 mCi Tc-23m sestamibi. Subsequently, intravenous infusion of Lexiscan was performed under the supervision of the Cardiology staff.  At peak effect of the drug, 30  mCi Tc-35m sestamibi was injected intravenously and standard myocardial SPECT  imaging was performed.  Quantitative gated imaging was also performed to evaluate left ventricular wall motion, and estimate left ventricular ejection fraction.  Comparison:  None  Findings: SPECT imaging demonstrates no reversible or irreversible defects to suggest ischemia or infarction.  Quantitative gated analysis shows normal wall motion.  The resting left ventricular ejection fraction is 69% with end- diastolic volume of 55 ml and end-systolic volume of 17 ml.  IMPRESSION: No evidence of ischemia or infarction ejection fraction 69%.   Original Report Authenticated By: Charlett Nose, M.D.     Cardiac Studies:  ECG:  SR no actue ischemic changes   Telemetry:  NSR no arrhythmia  Echo:  EF 55-60% mild AS  Medications:   . amLODipine  10 mg Oral Daily  . aspirin EC  81 mg Oral Daily  . losartan  100 mg Oral Daily   And  . hydrochlorothiazide  25 mg Oral Daily  . metoprolol tartrate  12.5 mg Oral BID  . potassium chloride  10 mEq Oral Daily  . simvastatin  20 mg Oral q1800       Assessment/Plan:  Preop:  No further chest pain Myovue normal with no ischemia Clear to have surgery  at any time AS:  Only mild by echo should not be and issue HTN:  Continue ARB beta blocker and HCTZ  Control back pain Relative bradycardia have decreased metoprolol to 12.5 bid and Increased amlodipine to 10mg   Make sure these changes noted on D/ C summary Discussed with patient and son Chol:  Continue statin  Will sign off  Charlton Haws 01/28/2013, 8:05 AM

## 2013-01-28 NOTE — Progress Notes (Signed)
WHILE GOING OVER PT'S DISCHARGE MEDICATIONS IT BECAME APPARENT THAT THE CHANGE IN PT'S BP MEDS  ORDERED BY DR NISHAN HADN'T BEEN ADDRESSED.  DR Eden Emms HAD ORDERED THAT PT'S NORVASC BEEN INCREASED TO 10 MG AND THAT CHANGE DIDN'T SHOW UP ON PT'S DISCHARGE MED LIST. THERE WAS NO PRESCRIPTION FOR THE METOTOPROL 12.5 MG THAT PT HAD BEEN PRESCRIBED  WHILE  INPATIENT.  TOLD PT AND HUSBAND THAT I  WOULD CONTACT  EITHER DR NISHAN'S OFFICE OR DR GEOFFRIES OFFICE TO CLARIFY THE CHANGES AND OR OBTAIN RX FOR BOTH OF THOSE MEDS BEFORE DISCHARGE.  PT AND HUSBAND TOLD ME THAT THEY WOULD CALL DR NISHAN'S OFFICE AFTER THEY WENT HOME.  I GOT THE IMPRESSION FROM THEM THAT THEY DID NOT WANT TO WAIT FOR ME TO CALL.  AFTER THEY LEFT I CALLED [facility] AND SPOKE WITH TRISH, WHO STATED SHE WOULD LET THE ON CALL MD KNOW ABOUT THE SITUATION.

## 2013-01-28 NOTE — Telephone Encounter (Signed)
New Prob    Husband calling on behalf of pt. Pt is in the hospital and he said they do not have a correct record of her medications. Would like to speak to nurse.

## 2013-01-28 NOTE — Telephone Encounter (Signed)
Spoke with the patients husband and gave him new doses on Amlodipine and Metoprolol per  Dr. Patty Sermons

## 2013-02-01 ENCOUNTER — Encounter (HOSPITAL_COMMUNITY): Payer: Self-pay | Admitting: Pharmacy Technician

## 2013-02-03 NOTE — Progress Notes (Signed)
Spoke with Dr Luft and patient and husband are aware that she may have clear liquids until 0900 then npo on 02/04/13 am  Husband voices understanding.  

## 2013-02-04 ENCOUNTER — Inpatient Hospital Stay (HOSPITAL_COMMUNITY): Payer: Medicare Other

## 2013-02-04 ENCOUNTER — Encounter (HOSPITAL_COMMUNITY): Payer: Self-pay | Admitting: *Deleted

## 2013-02-04 ENCOUNTER — Inpatient Hospital Stay (HOSPITAL_COMMUNITY): Payer: Medicare Other | Admitting: Anesthesiology

## 2013-02-04 ENCOUNTER — Inpatient Hospital Stay (HOSPITAL_COMMUNITY)
Admission: RE | Admit: 2013-02-04 | Discharge: 2013-02-06 | DRG: 491 | Disposition: A | Discharge status: Home Health | Payer: Medicare Other | Source: Ambulatory Visit | Attending: Orthopedic Surgery | Admitting: Orthopedic Surgery

## 2013-02-04 ENCOUNTER — Encounter (HOSPITAL_COMMUNITY)
Admission: RE | Disposition: A | Discharge status: Home Health | Payer: Self-pay | Source: Ambulatory Visit | Attending: Orthopedic Surgery

## 2013-02-04 ENCOUNTER — Encounter (HOSPITAL_COMMUNITY): Payer: Self-pay | Admitting: Anesthesiology

## 2013-02-04 DIAGNOSIS — Z7982 Long term (current) use of aspirin: Secondary | ICD-10-CM

## 2013-02-04 DIAGNOSIS — M549 Dorsalgia, unspecified: Secondary | ICD-10-CM | POA: Diagnosis not present

## 2013-02-04 DIAGNOSIS — Z79899 Other long term (current) drug therapy: Secondary | ICD-10-CM

## 2013-02-04 DIAGNOSIS — K219 Gastro-esophageal reflux disease without esophagitis: Secondary | ICD-10-CM | POA: Diagnosis present

## 2013-02-04 DIAGNOSIS — E785 Hyperlipidemia, unspecified: Secondary | ICD-10-CM | POA: Diagnosis present

## 2013-02-04 DIAGNOSIS — I10 Essential (primary) hypertension: Secondary | ICD-10-CM | POA: Diagnosis present

## 2013-02-04 DIAGNOSIS — H3530 Unspecified macular degeneration: Secondary | ICD-10-CM | POA: Diagnosis present

## 2013-02-04 DIAGNOSIS — M48062 Spinal stenosis, lumbar region with neurogenic claudication: Secondary | ICD-10-CM

## 2013-02-04 DIAGNOSIS — M48061 Spinal stenosis, lumbar region without neurogenic claudication: Principal | ICD-10-CM | POA: Diagnosis present

## 2013-02-04 DIAGNOSIS — M129 Arthropathy, unspecified: Secondary | ICD-10-CM | POA: Diagnosis present

## 2013-02-04 DIAGNOSIS — M539 Dorsopathy, unspecified: Secondary | ICD-10-CM | POA: Diagnosis not present

## 2013-02-04 DIAGNOSIS — I739 Peripheral vascular disease, unspecified: Secondary | ICD-10-CM | POA: Diagnosis not present

## 2013-02-04 HISTORY — PX: LUMBAR LAMINECTOMY/DECOMPRESSION MICRODISCECTOMY: SHX5026

## 2013-02-04 LAB — TYPE AND SCREEN: ABO/RH(D): AB POS

## 2013-02-04 LAB — ABO/RH: ABO/RH(D): AB POS

## 2013-02-04 SURGERY — LUMBAR LAMINECTOMY/DECOMPRESSION MICRODISCECTOMY 2 LEVELS
Anesthesia: General | Wound class: Clean

## 2013-02-04 MED ORDER — SODIUM CHLORIDE 0.9 % IR SOLN
Status: DC | PRN
  Administered 2013-02-04: 17:00:00

## 2013-02-04 MED ORDER — METHOCARBAMOL 100 MG/ML IJ SOLN
500.0000 mg | Freq: Four times a day (QID) | INTRAVENOUS | Status: DC | PRN
  Filled 2013-02-04: qty 5

## 2013-02-04 MED ORDER — CITALOPRAM HYDROBROMIDE 20 MG PO TABS
20.0000 mg | ORAL_TABLET | Freq: Every day | ORAL | Status: DC
  Administered 2013-02-05 – 2013-02-06 (×2): 20 mg via ORAL
  Filled 2013-02-04 (×2): qty 1

## 2013-02-04 MED ORDER — FLEET ENEMA 7-19 GM/118ML RE ENEM: 1.0000 | ENEMA | Freq: Once | RECTAL | Status: AC | PRN

## 2013-02-04 MED ORDER — LACTATED RINGERS IV SOLN: INTRAVENOUS | Status: DC

## 2013-02-04 MED ORDER — ACETAMINOPHEN 10 MG/ML IV SOLN
INTRAVENOUS | Status: DC | PRN
  Administered 2013-02-04: 1000 mg via INTRAVENOUS

## 2013-02-04 MED ORDER — MENTHOL 3 MG MT LOZG: 1.0000 | LOZENGE | OROMUCOSAL | Status: DC | PRN

## 2013-02-04 MED ORDER — ACETAMINOPHEN 650 MG RE SUPP: 650.0000 mg | RECTAL | Status: DC | PRN

## 2013-02-04 MED ORDER — PHENOL 1.4 % MT LIQD: 1.0000 | OROMUCOSAL | Status: DC | PRN

## 2013-02-04 MED ORDER — EPHEDRINE SULFATE 50 MG/ML IJ SOLN
INTRAMUSCULAR | Status: DC | PRN
  Administered 2013-02-04 (×4): 5 mg via INTRAVENOUS

## 2013-02-04 MED ORDER — ONDANSETRON HCL 4 MG/2ML IJ SOLN: 4.0000 mg | INTRAMUSCULAR | Status: DC | PRN

## 2013-02-04 MED ORDER — PANTOPRAZOLE SODIUM 40 MG PO TBEC
40.0000 mg | DELAYED_RELEASE_TABLET | Freq: Every day | ORAL | Status: DC
  Administered 2013-02-05 – 2013-02-06 (×2): 40 mg via ORAL
  Filled 2013-02-04 (×2): qty 1

## 2013-02-04 MED ORDER — CEFAZOLIN SODIUM 1-5 GM-% IV SOLN
1.0000 g | Freq: Three times a day (TID) | INTRAVENOUS | Status: AC
  Administered 2013-02-04 – 2013-02-05 (×2): 1 g via INTRAVENOUS
  Filled 2013-02-04 (×3): qty 50

## 2013-02-04 MED ORDER — METHOCARBAMOL 500 MG PO TABS: 500.0000 mg | ORAL_TABLET | Freq: Four times a day (QID) | ORAL | Status: DC | PRN

## 2013-02-04 MED ORDER — SIMVASTATIN 5 MG PO TABS
5.0000 mg | ORAL_TABLET | Freq: Every day | ORAL | Status: DC
  Administered 2013-02-05: 5 mg via ORAL
  Filled 2013-02-04 (×2): qty 1

## 2013-02-04 MED ORDER — NEOSTIGMINE METHYLSULFATE 1 MG/ML IJ SOLN
INTRAMUSCULAR | Status: DC | PRN
  Administered 2013-02-04: 3 mg via INTRAVENOUS

## 2013-02-04 MED ORDER — ACETAMINOPHEN 325 MG PO TABS: 650.0000 mg | ORAL_TABLET | ORAL | Status: DC | PRN

## 2013-02-04 MED ORDER — CEFAZOLIN SODIUM-DEXTROSE 2-3 GM-% IV SOLR
2.0000 g | INTRAVENOUS | Status: AC
  Administered 2013-02-04: 2 g via INTRAVENOUS

## 2013-02-04 MED ORDER — FENTANYL CITRATE 0.05 MG/ML IJ SOLN: 25.0000 ug | INTRAMUSCULAR | Status: DC | PRN

## 2013-02-04 MED ORDER — GLYCOPYRROLATE 0.2 MG/ML IJ SOLN
INTRAMUSCULAR | Status: DC | PRN
  Administered 2013-02-04: .5 mg via INTRAVENOUS

## 2013-02-04 MED ORDER — HEMOSTATIC AGENTS (NO CHARGE) OPTIME
TOPICAL | Status: DC | PRN
  Administered 2013-02-04: 1 via TOPICAL

## 2013-02-04 MED ORDER — HYDROMORPHONE HCL PF 1 MG/ML IJ SOLN
0.5000 mg | INTRAMUSCULAR | Status: DC | PRN
  Administered 2013-02-05: 1 mg via INTRAVENOUS
  Filled 2013-02-04: qty 1

## 2013-02-04 MED ORDER — PHENYLEPHRINE HCL 10 MG/ML IJ SOLN
INTRAMUSCULAR | Status: DC | PRN
  Administered 2013-02-04: 40 ug via INTRAVENOUS

## 2013-02-04 MED ORDER — BISACODYL 10 MG RE SUPP: 10.0000 mg | Freq: Every day | RECTAL | Status: DC | PRN

## 2013-02-04 MED ORDER — HYDROMORPHONE HCL PF 1 MG/ML IJ SOLN: 0.2500 mg | INTRAMUSCULAR | Status: DC | PRN

## 2013-02-04 MED ORDER — HYDROCODONE-ACETAMINOPHEN 5-325 MG PO TABS
1.0000 | ORAL_TABLET | ORAL | Status: DC | PRN
  Administered 2013-02-04: 1 via ORAL
  Administered 2013-02-05: 2 via ORAL
  Administered 2013-02-05: 1 via ORAL
  Administered 2013-02-05 – 2013-02-06 (×4): 2 via ORAL
  Filled 2013-02-04 (×4): qty 2
  Filled 2013-02-04: qty 1
  Filled 2013-02-04 (×2): qty 2

## 2013-02-04 MED ORDER — THROMBIN 5000 UNITS EX SOLR
CUTANEOUS | Status: DC | PRN
  Administered 2013-02-04: 10000 [IU] via TOPICAL

## 2013-02-04 MED ORDER — METOPROLOL TARTRATE 12.5 MG HALF TABLET
12.5000 mg | ORAL_TABLET | Freq: Two times a day (BID) | ORAL | Status: DC
  Administered 2013-02-04 – 2013-02-06 (×4): 12.5 mg via ORAL
  Filled 2013-02-04 (×5): qty 1

## 2013-02-04 MED ORDER — LOSARTAN POTASSIUM-HCTZ 100-25 MG PO TABS: 1.0000 | ORAL_TABLET | Freq: Every day | ORAL | Status: DC

## 2013-02-04 MED ORDER — POTASSIUM CHLORIDE CRYS ER 20 MEQ PO TBCR
20.0000 meq | EXTENDED_RELEASE_TABLET | Freq: Every day | ORAL | Status: DC
  Administered 2013-02-04 – 2013-02-06 (×3): 20 meq via ORAL
  Filled 2013-02-04 (×3): qty 1

## 2013-02-04 MED ORDER — MIDAZOLAM HCL 5 MG/5ML IJ SOLN
INTRAMUSCULAR | Status: DC | PRN
  Administered 2013-02-04 (×2): 1 mg via INTRAVENOUS

## 2013-02-04 MED ORDER — OXYCODONE-ACETAMINOPHEN 5-325 MG PO TABS: 1.0000 | ORAL_TABLET | ORAL | Status: DC | PRN

## 2013-02-04 MED ORDER — PROMETHAZINE HCL 25 MG/ML IJ SOLN: 6.2500 mg | INTRAMUSCULAR | Status: DC | PRN

## 2013-02-04 MED ORDER — MEPERIDINE HCL 50 MG/ML IJ SOLN: 6.2500 mg | INTRAMUSCULAR | Status: DC | PRN

## 2013-02-04 MED ORDER — PREDNISONE 10 MG PO TABS
10.0000 mg | ORAL_TABLET | Freq: Every day | ORAL | Status: DC
  Administered 2013-02-05 – 2013-02-06 (×2): 10 mg via ORAL
  Filled 2013-02-04 (×3): qty 1

## 2013-02-04 MED ORDER — LACTATED RINGERS IV SOLN
INTRAVENOUS | Status: DC
  Administered 2013-02-04 – 2013-02-06 (×2): via INTRAVENOUS

## 2013-02-04 MED ORDER — HYDROCHLOROTHIAZIDE 25 MG PO TABS
25.0000 mg | ORAL_TABLET | Freq: Every day | ORAL | Status: DC
  Administered 2013-02-05 – 2013-02-06 (×2): 25 mg via ORAL
  Filled 2013-02-04 (×2): qty 1

## 2013-02-04 MED ORDER — PROPOFOL 10 MG/ML IV BOLUS
INTRAVENOUS | Status: DC | PRN
  Administered 2013-02-04: 100 mg via INTRAVENOUS

## 2013-02-04 MED ORDER — ONDANSETRON HCL 4 MG/2ML IJ SOLN
INTRAMUSCULAR | Status: DC | PRN
  Administered 2013-02-04: 4 mg via INTRAVENOUS

## 2013-02-04 MED ORDER — ROCURONIUM BROMIDE 100 MG/10ML IV SOLN
INTRAVENOUS | Status: DC | PRN
  Administered 2013-02-04: 40 mg via INTRAVENOUS

## 2013-02-04 MED ORDER — AMLODIPINE BESYLATE 10 MG PO TABS
10.0000 mg | ORAL_TABLET | Freq: Every morning | ORAL | Status: DC
Start: 2013-02-05 — End: 2013-02-06
  Administered 2013-02-05 – 2013-02-06 (×2): 10 mg via ORAL
  Filled 2013-02-04 (×2): qty 1

## 2013-02-04 MED ORDER — FENTANYL CITRATE 0.05 MG/ML IJ SOLN
INTRAMUSCULAR | Status: DC | PRN
  Administered 2013-02-04: 50 ug via INTRAVENOUS
  Administered 2013-02-04: 100 ug via INTRAVENOUS

## 2013-02-04 MED ORDER — BUPIVACAINE LIPOSOME 1.3 % IJ SUSP
20.0000 mL | Freq: Once | INTRAMUSCULAR | Status: AC
  Administered 2013-02-04: 20 mL
  Filled 2013-02-04: qty 20

## 2013-02-04 MED ORDER — LOSARTAN POTASSIUM 50 MG PO TABS
100.0000 mg | ORAL_TABLET | Freq: Every day | ORAL | Status: DC
  Administered 2013-02-05 – 2013-02-06 (×2): 100 mg via ORAL
  Filled 2013-02-04 (×2): qty 2

## 2013-02-04 MED ORDER — BACITRACIN-NEOMYCIN-POLYMYXIN 400-5-5000 EX OINT
TOPICAL_OINTMENT | CUTANEOUS | Status: DC | PRN
  Administered 2013-02-04: 1 via TOPICAL

## 2013-02-04 MED ORDER — LACTATED RINGERS IV SOLN
INTRAVENOUS | Status: DC
  Administered 2013-02-04: 18:00:00 via INTRAVENOUS
  Administered 2013-02-04: 1000 mL via INTRAVENOUS

## 2013-02-04 MED ORDER — POLYETHYLENE GLYCOL 3350 17 G PO PACK
17.0000 g | PACK | Freq: Every day | ORAL | Status: DC | PRN
  Filled 2013-02-04: qty 1

## 2013-02-04 SURGICAL SUPPLY — 46 items
APL SKNCLS STERI-STRIP NONHPOA (GAUZE/BANDAGES/DRESSINGS) ×1
BAG SPEC THK2 15X12 ZIP CLS (MISCELLANEOUS) ×3
BAG ZIPLOCK 12X15 (MISCELLANEOUS) ×4 IMPLANT
BENZOIN TINCTURE PRP APPL 2/3 (GAUZE/BANDAGES/DRESSINGS) ×2 IMPLANT
CLEANER TIP ELECTROSURG 2X2 (MISCELLANEOUS) ×2 IMPLANT
CLOTH BEACON ORANGE TIMEOUT ST (SAFETY) ×2 IMPLANT
CONT SPECI 4OZ STER CLIK (MISCELLANEOUS) ×2 IMPLANT
DRAIN PENROSE 18X1/4 LTX STRL (WOUND CARE) IMPLANT
DRAPE MICROSCOPE LEICA (MISCELLANEOUS) ×2 IMPLANT
DRAPE POUCH INSTRU U-SHP 10X18 (DRAPES) ×2 IMPLANT
DRAPE SURG 17X11 SM STRL (DRAPES) ×2 IMPLANT
DRSG ADAPTIC 3X8 NADH LF (GAUZE/BANDAGES/DRESSINGS) ×2 IMPLANT
DRSG PAD ABDOMINAL 8X10 ST (GAUZE/BANDAGES/DRESSINGS) ×5 IMPLANT
DURAPREP 26ML APPLICATOR (WOUND CARE) ×2 IMPLANT
ELECT BLADE TIP CTD 4 INCH (ELECTRODE) ×1 IMPLANT
ELECT REM PT RETURN 9FT ADLT (ELECTROSURGICAL) ×2
ELECTRODE REM PT RTRN 9FT ADLT (ELECTROSURGICAL) ×1 IMPLANT
GLOVE BIOGEL PI IND STRL 8 (GLOVE) ×2 IMPLANT
GLOVE BIOGEL PI INDICATOR 8 (GLOVE) ×2
GLOVE ECLIPSE 8.0 STRL XLNG CF (GLOVE) ×6 IMPLANT
GOWN PREVENTION PLUS LG XLONG (DISPOSABLE) ×3 IMPLANT
GOWN STRL REIN XL XLG (GOWN DISPOSABLE) ×4 IMPLANT
KIT BASIN OR (CUSTOM PROCEDURE TRAY) ×2 IMPLANT
KIT POSITIONING SURG ANDREWS (MISCELLANEOUS) ×2 IMPLANT
MANIFOLD NEPTUNE II (INSTRUMENTS) ×2 IMPLANT
NDL SPNL 18GX3.5 QUINCKE PK (NEEDLE) ×2 IMPLANT
NEEDLE SPNL 18GX3.5 QUINCKE PK (NEEDLE) ×4 IMPLANT
NS IRRIG 1000ML POUR BTL (IV SOLUTION) ×2 IMPLANT
PATTIES SURGICAL .5 X.5 (GAUZE/BANDAGES/DRESSINGS) IMPLANT
PATTIES SURGICAL .75X.75 (GAUZE/BANDAGES/DRESSINGS) ×1 IMPLANT
PATTIES SURGICAL 1X1 (DISPOSABLE) IMPLANT
PIN SAFETY NICK PLATE  2 MED (MISCELLANEOUS)
PIN SAFETY NICK PLATE 2 MED (MISCELLANEOUS) IMPLANT
POSITIONER SURGICAL ARM (MISCELLANEOUS) ×2 IMPLANT
SPONGE GAUZE 4X4 12PLY (GAUZE/BANDAGES/DRESSINGS) ×1 IMPLANT
SPONGE LAP 4X18 X RAY DECT (DISPOSABLE) ×1 IMPLANT
SPONGE SURGIFOAM ABS GEL 100 (HEMOSTASIS) ×2 IMPLANT
STAPLER VISISTAT 35W (STAPLE) IMPLANT
SUT VIC AB 0 CT1 27 (SUTURE) ×2
SUT VIC AB 0 CT1 27XBRD ANTBC (SUTURE) ×1 IMPLANT
SUT VIC AB 1 CT1 27 (SUTURE) ×4
SUT VIC AB 1 CT1 27XBRD ANTBC (SUTURE) ×4 IMPLANT
TAPE CLOTH SURG 6X10 WHT LF (GAUZE/BANDAGES/DRESSINGS) ×1 IMPLANT
TOWEL OR 17X26 10 PK STRL BLUE (TOWEL DISPOSABLE) ×4 IMPLANT
TRAY LAMINECTOMY (CUSTOM PROCEDURE TRAY) ×2 IMPLANT
WATER STERILE IRR 1500ML POUR (IV SOLUTION) ×1 IMPLANT

## 2013-02-04 NOTE — H&P (View-Only) (Signed)
Spoke with Dr Aline August and patient and husband are aware that she may have clear liquids until 0900 then npo on 02/04/13 am  Husband voices understanding.

## 2013-02-04 NOTE — Transfer of Care (Signed)
Immediate Anesthesia Transfer of Care Note  Patient: Tamara Gregory  Procedure(s) Performed: Procedure(s): CENTRAL DECOMPRESSION/LUMBAR LAMINECTOMY L3-L4 AND L4-L5 2 LEVELS (N/A)  Patient Location: PACU  Anesthesia Type:General  Level of Consciousness: awake, oriented and patient cooperative  Airway & Oxygen Therapy: Patient Spontanous Breathing and Patient connected to face mask oxygen  Post-op Assessment: Report given to PACU RN, Post -op Vital signs reviewed and stable and Patient moving all extremities  Post vital signs: Reviewed and stable  Complications: No apparent anesthesia complications

## 2013-02-04 NOTE — Interval H&P Note (Signed)
History and Physical Interval Note:  02/04/2013 3:33 PM  Tamara Gregory  has presented today for surgery, with the diagnosis of SPINAL STENOSIS  The various methods of treatment have been discussed with the patient and family. After consideration of risks, benefits and other options for treatment, the patient has consented to  Procedure(s): CENTRAL DECOMPRESSION/LUMBAR LAMINECTOMY L3-L4 AND L4-L5 2 LEVELS (N/A) as a surgical intervention .  The patient's history has been reviewed, patient examined, no change in status, stable for surgery.  I have reviewed the patient's chart and labs.  Questions were answered to the patient's satisfaction.     Mady Oubre A

## 2013-02-04 NOTE — Anesthesia Postprocedure Evaluation (Signed)
  Anesthesia Post-op Note  Patient: Tamara Gregory  Procedure(s) Performed: Procedure(s) (LRB): CENTRAL DECOMPRESSION/LUMBAR LAMINECTOMY L3-L4 AND L4-L5 2 LEVELS (N/A)  Patient Location: PACU  Anesthesia Type: General  Level of Consciousness: awake and alert   Airway and Oxygen Therapy: Patient Spontanous Breathing  Post-op Pain: mild  Post-op Assessment: Post-op Vital signs reviewed, Patient's Cardiovascular Status Stable, Respiratory Function Stable, Patent Airway and No signs of Nausea or vomiting  Last Vitals:  Filed Vitals:   02/04/13 1915  BP: 158/78  Pulse: 67  Temp:   Resp: 19    Post-op Vital Signs: stable   Complications: No apparent anesthesia complications

## 2013-02-04 NOTE — Brief Op Note (Signed)
02/04/2013  6:21 PM  PATIENT:  Tamara Gregory  79 y.o. female  PRE-OPERATIVE DIAGNOSIS:  SPINAL STENOSIS,L-3-L-4 and L-4-L-5  POST-OPERATIVE DIAGNOSIS:  SPINAL STENOSIS,L-3-L-4 and L-4-L-5, with Severe Foraminal Stenosis at Both levels.  PROCEDURE:  Procedure(s): CENTRAL DECOMPRESSION/LUMBAR LAMINECTOMY L3-L4 AND L4-L5 2 LEVELS (N/A)and Foraminotomies for two nerve roots L-4 and L-5  SURGEON:  Surgeon(s) and Role:    * Jacki Cones, MD - Primary    * Drucilla Schmidt, MD - Assisting     ASSISTANTS:James Aplington MD}   ANESTHESIA:   general  EBL:  Total I/O In: 1000 [I.V.:1000] Out: 350 [Urine:300; Blood:50]  BLOOD ADMINISTERED:none  DRAINS: none   LOCAL MEDICATIONS USED:  BUPIVICAINE 20cc.  SPECIMEN:  No Specimen  DISPOSITION OF SPECIMEN:  N/A  COUNTS:  YES  TOURNIQUET:  * No tourniquets in log *  DICTATION: .Other Dictation: Dictation Number [id number]  PLAN OF CARE: Admit to inpatient   PATIENT DISPOSITION:  PACU - hemodynamically stable.   Delay start of Pharmacological VTE agent (>24hrs) due to surgical blood loss or risk of bleeding: yes

## 2013-02-05 MED ORDER — OXYCODONE-ACETAMINOPHEN 10-325 MG PO TABS: 1.0000 | ORAL_TABLET | ORAL | Status: DC | PRN

## 2013-02-05 MED ORDER — TEMAZEPAM 15 MG PO CAPS
15.0000 mg | ORAL_CAPSULE | Freq: Every evening | ORAL | Status: DC | PRN
  Administered 2013-02-05: 15 mg via ORAL
  Filled 2013-02-05: qty 1

## 2013-02-05 MED ORDER — METHOCARBAMOL 500 MG PO TABS: 500.0000 mg | ORAL_TABLET | Freq: Four times a day (QID) | ORAL | Status: DC | PRN

## 2013-02-05 MED FILL — Dextrose Inj 5%: INTRAVENOUS | Qty: 50 | Status: AC

## 2013-02-05 MED FILL — Cefazolin Sodium for IV Soln 2 GM and Dextrose 3% (50 ML): INTRAVENOUS | Qty: 50 | Status: AC

## 2013-02-05 MED FILL — Acetaminophen IV Soln 10 MG/ML: INTRAVENOUS | Qty: 100 | Status: AC

## 2013-02-05 NOTE — Evaluation (Signed)
Occupational Therapy Evaluation Patient Details Name: Tamara Gregory MRN: [id number] DOB: 18-Apr-1925 Today's Date: 02/05/2013 Time: 9147-8295 OT Time Calculation (min): 38 min  OT Assessment / Plan / Recommendation Clinical Impression  This 79 year old female was admitted for L3-4, L 4-5 decompression.  She will benefit from skilled OT to increase independence and safety with adls.      OT Assessment  Patient needs continued OT Services    Follow Up Recommendations  Home health OT    Barriers to Discharge      Equipment Recommendations  None recommended by OT    Recommendations for Other Services    Frequency  Min 2X/week    Precautions / Restrictions Precautions Precautions: Back Precaution Booklet Issued: Yes (comment) Restrictions Weight Bearing Restrictions: No   Pertinent Vitals/Pain Back sore; repositioned and ice applied   ADL  Grooming: Wash/dry hands;Set up Where Assessed - Grooming: Unsupported sitting Upper Body Bathing: Set up Where Assessed - Upper Body Bathing: Unsupported sitting Lower Body Bathing: Moderate assistance Where Assessed - Lower Body Bathing: Supported sit to stand Upper Body Dressing: Minimal assistance (iv) Where Assessed - Upper Body Dressing: Unsupported sitting Lower Body Dressing: Maximal assistance Where Assessed - Lower Body Dressing: Supported sit to stand Toilet Transfer: Minimal assistance Toilet Transfer Method: Sit to Barista: Raised toilet seat with arms (or 3-in-1 over toilet) Toileting - Clothing Manipulation and Hygiene: Minimal assistance Where Assessed - Toileting Clothing Manipulation and Hygiene: Sit to stand from 3-in-1 or toilet Transfers/Ambulation Related to ADLs: ambulated to bathroom.  Pt needed cues for using walker--tried to leave it to side a couple of times.  She is used to a 4 wheel walker and walked further away than optimal.   ADL Comments: Educated pt and husband about back  precautions    OT Diagnosis: Generalized weakness  OT Problem List: Decreased strength;Decreased activity tolerance;Decreased safety awareness;Decreased knowledge of use of DME or AE;Decreased knowledge of precautions;Pain OT Treatment Interventions: Self-care/ADL training;DME and/or AE instruction;Therapeutic activities;Patient/family education   OT Goals Acute Rehab OT Goals OT Goal Formulation: With patient/family Time For Goal Achievement: 02/12/13 Potential to Achieve Goals: Good ADL Goals Pt Will Transfer to Toilet: with min assist;Ambulation;3-in-1 (min guard) ADL Goal: Toilet Transfer - Progress: Goal set today Pt Will Perform Toileting - Hygiene: with min assist;Sit to stand from 3-in-1/toilet (min guard) ADL Goal: Toileting - Hygiene - Progress: Goal set today Pt Will Perform Tub/Shower Transfer: Shower transfer;with min assist;Ambulation (3:1) ADL Goal: Web designer - Progress: Goal set today Miscellaneous OT Goals Miscellaneous OT Goal #1: Pt will verbalize 3/3 back precautions and verbalize that she needs to keep back straight during sit to stand OT Goal: Miscellaneous Goal #1 - Progress: Goal set today  Visit Information  Last OT Received On: 02/05/13 Assistance Needed: +1 PT/OT Co-Evaluation/Treatment: Yes    Subjective Data  Subjective: My walker has 4 wheels.  These wheels don't turn Patient Stated Goal: home   Prior Functioning     Home Living Lives With: Spouse Home Access: Stairs to enter Entrance Stairs-Number of Steps: 1 Bathroom Shower/Tub: Health visitor: Standard Home Adaptive Equipment: Bedside commode/3-in-1;Walker - four wheeled Prior Function Level of Independence: Needs assistance (LB ADLs) Communication Communication: No difficulties         Vision/Perception     Cognition  Cognition Overall Cognitive Status: Appears within functional limits for tasks assessed/performed Arousal/Alertness:  Awake/alert Orientation Level: Appears intact for tasks assessed Behavior During Session: [facility] for tasks  performed Cognition - Other Comments: needs reinforcement with back precautions; pt a little bit distractible    Extremity/Trunk Assessment Right Upper Extremity Assessment RUE ROM/Strength/Tone: WFL for tasks assessed Left Upper Extremity Assessment LUE ROM/Strength/Tone: WFL for tasks assessed   Bed Mobility: Sit to sidelying left:  Mod A  Mobility Transfers Transfers: Sit to Stand Sit to Stand: 4: Min assist;From chair/3-in-1;With armrests;With upper extremity assist     Exercise     Balance     End of Session OT - End of Session Activity Tolerance: Patient tolerated treatment well Patient left: in bed;with family/visitor present;with call bell/phone within reach  GO     SPENCER,MARYELLEN 02/05/2013, 3:03 PM Marica Otter, OTR/L [phone] 02/05/2013

## 2013-02-05 NOTE — Progress Notes (Signed)
   Subjective: 1 Day Post-Op Procedure(s) (LRB): CENTRAL DECOMPRESSION/LUMBAR LAMINECTOMY L3-L4 AND L4-L5 2 LEVELS (N/A) Patient reports pain as moderate.   Patient seen in rounds without Dr. Darrelyn Hillock. Patient is having some discomfort in the low back. She is finding it difficult to get comfortable in the bed. She denies chest pain and shortness of breath. She has been using the incentive spirometer but complains that it wears her out. She denies leg pain as well as numbness and tingling.  Plan is to go Home after hospital stay.  Objective: Vital signs in last 24 hours: Temp:  [98 F (36.7 C)-99.4 F (37.4 C)] 99.4 F (37.4 C) (03/07 0535) Pulse Rate:  [67-86] 70 (03/07 0535) Resp:  [14-24] 18 (03/07 0535) BP: (134-181)/(68-96) 134/82 mmHg (03/07 0535) SpO2:  [91 %-98 %] 91 % (03/07 0535) Weight:  [68.04 kg (150 lb)] 68.04 kg (150 lb) (03/06 2142)  Intake/Output from previous day:  Intake/Output Summary (Last 24 hours) at 02/05/13 0814 Last data filed at 02/05/13 0230  Gross per 24 hour  Intake   1300 ml  Output    750 ml  Net    550 ml     Labs:  EXAM General - Patient is Alert and Oriented Extremity - Neurologically intact Dorsiflexion/Plantar flexion intact Incision: dressing C/D/I Motor Function - intact, moving foot and toes well on exam.   Past Medical History  Diagnosis Date  . Hypertension   . Hyperlipidemia   . Depression   . History of migraines   . Vitamin D deficiency   . GERD (gastroesophageal reflux disease)   . Headache   . Arthritis   . Cancer     hx of skin cancer   . Macular degeneration   . Myasthenia gravis   . Thoracic aneurysm     4.4cm; see CT done 01/28/12 and 01/22/13 in [facility].   [name] Spinal stenosis     Assessment/Plan: 1 Day Post-Op Procedure(s) (LRB): CENTRAL DECOMPRESSION/LUMBAR LAMINECTOMY L3-L4 AND L4-L5 2 LEVELS (N/A) Active Problems:   Spinal stenosis, lumbar region, with neurogenic claudication  Estimated body mass index is  26.58 kg/(m^2) as calculated from the following:   Height as of this encounter: 5\' 3"  (1.6 m).   Weight as of this encounter: 68.04 kg (150 lb). Advance diet Up with therapy Plan for discharge tomorrow  DVT Prophylaxis - Aspirin Weight-Bearing as tolerated   Continue telemetry today through therapy today. Will plan for discharge home tomorrow. She should continue to use the incentive spirometer.   Tamara Gregory Tamara Gregory 02/05/2013, 8:14 AM

## 2013-02-05 NOTE — Progress Notes (Signed)
Admitted w/spinal stenosis, pod#1 lumbar laminectomy.From home, has spouse,pcp,pharmacy,cane,rw.Await PT/OT recommendations.D/C  plan home.

## 2013-02-05 NOTE — Care Management Note (Addendum)
    Page 1 of 2   02/07/2013     4:52:07 PM   CARE MANAGEMENT NOTE 02/07/2013  Patient:  LEANA, SPRINGSTON   Account Number:  [id number]  Date Initiated:  02/05/2013  Documentation initiated by:  Lanier Clam  Subjective/Objective Assessment:   ADMITTED W/SPINAL STENOSIS.READMIT-2/25/2/27/14-SPINAL STENOSIS.     Action/Plan:   FROM HOME W/SPOUSE.HAS CANE,RW.HAS PCP,PHARMACY.   Anticipated DC Date:  02/06/2013   Anticipated DC Plan:  HOME W HOME HEALTH SERVICES      DC Planning Services  CM consult      [facility] Choice  HOME HEALTH   Choice offered to / List presented to:  C-3 Spouse        HH arranged  HH-2 PT      [facility] agency  [facility]   Status of service:  Completed, signed off Medicare Important Message given?   (If response is "NO", the following Medicare IM given date fields will be blank) Date Medicare IM given:   Date Additional Medicare IM given:    Discharge Disposition:  HOME W HOME HEALTH SERVICES  Per UR Regulation:  Reviewed for med. necessity/level of care/duration of stay  If discussed at Long Length of Stay Meetings, dates discussed:    Comments:  02/06/13 [facility] RECEIVED REFERRAL FOR HHC SERVICES FROM NURSING UNIT BASED ON PT RECS. OFFERED CHOICE TO HUSBAND AND PATIENT, PROVIDED HHC AGENCY LIST. CHOSE [facility]. LAURIE WITH [facility] AWARE OF REFERRAL. DR Charlann Boxer CALLED FOR ORDERS AND FACE TO FACE THAT WAS PLACED IN [facility]. LAURIE/[facility] AWARE OF ORDERS AND STATES [facility] ABLE TO RECEIVE REFERRAL. ATIKAHALL,RNCM [phone]   02/05/13 KATHY MAHABIR RN,BSN NCM [phone] WILL AWAIT PT/OT RECOMMENDATIONS.

## 2013-02-05 NOTE — Evaluation (Signed)
Physical Therapy Evaluation Patient Details Name: Tamara Gregory MRN: [id number] DOB: 23-Mar-1925 Today's Date: 02/05/2013 Time: 4098-1191 PT Time Calculation (min): 38 min  PT Assessment / Plan / Recommendation Clinical Impression  Pt. is 79 yo female admitted 02/04/13 for lumbar laminectomies. Pt. initially had abnormal gait with LLE tendency to adduct and actually cross over. Gait improved and became safer. Pt. will benefit from PT while in acute care and at DC w/ HHPT    PT Assessment  Patient needs continued PT services    Follow Up Recommendations  Home health PT    Does the patient have the potential to tolerate intense rehabilitation      Barriers to Discharge        Equipment Recommendations  None recommended by PT    Recommendations for Other Services     Frequency Min 6X/week    Precautions / Restrictions Precautions Precautions: Back Precaution Booklet Issued: Yes (comment) Restrictions Weight Bearing Restrictions: No   Pertinent Vitals/Pain       Mobility  Transfers Sit to Stand: 4: Min assist;From chair/3-in-1;With armrests;With upper extremity assist Details for Transfer Assistance: cues for safe use of UE's Ambulation/Gait Ambulation/Gait Assistance: 4: Min assist Ambulation Distance (Feet): 100 Feet Assistive device: 4-wheeled walker Ambulation/Gait Assistance Details: converted to 4 wheeled as pt has one. Gait Pattern: Left circumduction;Scissoring    Exercises     PT Diagnosis: Abnormality of gait  PT Problem List: Decreased strength;Decreased activity tolerance;Decreased mobility;Decreased coordination;Decreased knowledge of precautions;Decreased safety awareness;Decreased knowledge of use of DME PT Treatment Interventions: DME instruction;Gait training;Functional mobility training;Therapeutic activities;Therapeutic exercise;Patient/family education   PT Goals Acute Rehab PT Goals PT Goal Formulation: With patient/family Time For Goal  Achievement: 02/12/13 Potential to Achieve Goals: Good Pt will go Supine/Side to Sit: with supervision PT Goal: Supine/Side to Sit - Progress: Goal set today Pt will go Sit to Supine/Side: with supervision PT Goal: Sit to Supine/Side - Progress: Goal set today Pt will go Sit to Stand: with supervision PT Goal: Sit to Stand - Progress: Goal set today Pt will go Stand to Sit: with supervision PT Goal: Stand to Sit - Progress: Goal set today Pt will Ambulate: 51 - 150 feet;with supervision;with rolling walker PT Goal: Ambulate - Progress: Goal set today  Visit Information  Last PT Received On: 02/05/13 Assistance Needed: +1    Subjective Data  Subjective: I can't take my RW to bed. Patient Stated Goal: going home tomorrow.   Prior Functioning  Home Living Lives With: Spouse Home Access: Stairs to enter Secretary/administrator of Steps: 1 Bathroom Shower/Tub: Health visitor: Standard Home Adaptive Equipment: Bedside commode/3-in-1;Walker - four wheeled Prior Function Level of Independence: Needs assistance (LB ADLs) Communication Communication: No difficulties    Cognition  Cognition Overall Cognitive Status: Appears within functional limits for tasks assessed/performed Arousal/Alertness: Awake/alert Orientation Level: Appears intact for tasks assessed Behavior During Session: [facility] for tasks performed Cognition - Other Comments: needs reinforcement with back precautions and for keeping rw close, not step away.    Extremity/Trunk Assessment Right Upper Extremity Assessment RUE ROM/Strength/Tone: [facility] for tasks assessed Left Upper Extremity Assessment LUE ROM/Strength/Tone: WFL for tasks assessed Right Lower Extremity Assessment RLE ROM/Strength/Tone: [facility] for tasks assessed Left Lower Extremity Assessment LLE ROM/Strength/Tone: Deficits LLE ROM/Strength/Tone Deficits: L knee appears to hyperextend, decr. control of swing    Balance    End of Session PT - End  of Session Activity Tolerance: Patient tolerated treatment well Patient left: in bed;with call bell/phone within  reach;with family/visitor present  GP     Rada Hay 02/05/2013, 3:37 PM

## 2013-02-05 NOTE — Op Note (Signed)
Tamara Gregory, Tamara Gregory                ACCOUNT NO.:  [id number]  MEDICAL RECORD NO.:  [id number]  LOCATION:  1409                         FACILITY:  [facility]  PHYSICIAN:  [name]. Gioffre, M.D.DATE OF BIRTH:  04/02/1925  DATE OF PROCEDURE:  02/04/2013 DATE OF DISCHARGE:                              OPERATIVE REPORT   SURGEON:  [name]. Darrelyn Hillock, M.D.  ASSISTANT:  Marlowe Kays, MD.  PREOPERATIVE DIAGNOSIS: 1. Severe spinal stenosis at L3-4. 2. Severe spinal stenosis at L4-5. 3. Severe foraminal stenosis at L3 root. 4. Severe foraminal stenosis at L4 root and L5 root.  POSTOPERATIVE DIAGNOSIS: 1. Severe spinal stenosis at L3-4. 2. Severe spinal stenosis at L4-5. 3. Severe foraminal stenosis at L3 root. 4. Severe foraminal stenosis at L4 root and L5 root.  OPERATION: 1. Complete decompressive lumbar laminectomy at L3-4 for spinal     stenosis. 2. Complete decompressive lumbar laminectomy at L4-5 for spinal     stenosis. 3. Foraminotomy for the L4 root bilaterally. 4. Foraminotomy for the L5 root.  PROCEDURE:  Under general anesthesia, with the patient on a spinal frame, routine orthopedic prep and draping of the lower back was carried out.  The patient had 2 g of IV Ancef.  The appropriate time-out was first carried out.  I also marked the appropriate left side of her back in the holding area.  ALL HER SYMPTOMS were on the left.  She also had definite weakness of her hip flexors and dorsiflexors of the left foot.  Note, we went at both levels.  Preop, she had a selective nerve root block at L4 on the left and gave her total pain relief for 24 hours and that was the exact area where we were, we noticed the cortisone there as well at the time of surgery.  At this time, 2 needles were placed in the back for localization purposes.  As I said, the appropriate time-out was carried out.  I then noted level we needed to do the surgery, needles were removed.  Incision was made over  the L3-4, L4-5 in usual fashion. The muscle was stripped from the lamina and spinous process bilaterally. At this time, another x-ray was taken with 2 Kocher clamps in place. Following that, we identified the L3 spinous process, the L4 spinous process, and the L5 spinous process.  We went down, did a complete central decompressive lumbar laminectomy at L3-4 and L4-5.  We did use the microscope.  The microscope was brought in.  Great care was taken not to injure the underlying dura.  We did protect the dura at all times.  She had rather severe constriction __________ at L4-5 and that is where we found the cortisone preparation as well.  At this time, we protected the dura and carefully went out decompressed both the lateral recesses.  We went far out laterally, went out into the foramina bilaterally at 2 levels, and did foraminotomies as well.  We went proximally with a hockey-stick and distally with the hockey-stick and out into the foramina with a hockey-stick until we had total freedom. In the other words, we continued the decompression until the nerve roots and dura were totally  free, and there was no further constriction.  We also removed the ligamentum flavum as well.  Another x-ray was taken with clamps in place to verify how far distally and proximally we needed to go.  We thoroughly irrigated out the area, loosely applied some thrombin-soaked Gelfoam, closed the wound layers in usual fashion except I left a small deep distal and proximal part of the wound open for drainage purposes.  The subcu was closed with 0 Vicryl, skin with metal staples.  Sterile Neosporin dressing was applied.  Note, after closing the muscle, I injected 20 mL of Exparel into the soft tissue.  The patient left the operative room in satisfactory condition.          ______________________________ [name] Darrelyn Hillock, M.D.     RAG/MEDQ  D:  02/04/2013  T:  02/05/2013  Job:  [id number]  cc:   Cassell Clement, M.D. Fax: [phone]

## 2013-02-06 NOTE — Progress Notes (Signed)
Physical Therapy Treatment Patient Details Name: Tamara Gregory MRN: [id number] DOB: 15-Nov-1925 Today's Date: 02/06/2013 Time: 4098-1191 PT Time Calculation (min): 18 min  PT Assessment / Plan / Recommendation Comments on Treatment Session  reiterated w/ pt/spouse to sit in straight chair and to walk every 2 hours. Pt is ready for DC.     Follow Up Recommendations        Does the patient have the potential to tolerate intense rehabilitation     Barriers to Discharge        Equipment Recommendations  None recommended by PT    Recommendations for Other Services    Frequency     Plan      Precautions / Restrictions Precautions Precautions: Back Restrictions Weight Bearing Restrictions: No   Pertinent Vitals/Pain     Mobility  Bed Mobility Bed Mobility: Rolling Right;Rolling Left;Left Sidelying to Sit;Sit to Sidelying Left Rolling Right: 4: Min assist Rolling Left: 4: Min assist Left Sidelying to Sit: 3: Mod assist Sit to Sidelying Left: 3: Mod assist Details for Bed Mobility Assistance: husband observed bed mobility Transfers Transfers: Stand to Sit Sit to Stand: 5: Supervision;From chair/3-in-1;With armrests Stand to Sit: 5: Supervision;With upper extremity assist;With armrests Details for Transfer Assistance: cues for back precautions and use of arms Ambulation/Gait Ambulation/Gait Assistance: 5: Supervision Assistive device: 4-wheeled walker Gait Pattern: Step-through pattern General Gait Details: cues for posture.    Exercises     PT Diagnosis:    PT Problem List:   PT Treatment Interventions:     PT Goals Acute Rehab PT Goals Pt will go Sit to Stand: with supervision PT Goal: Sit to Stand - Progress: Met Pt will go Stand to Sit: with supervision PT Goal: Stand to Sit - Progress: Met Pt will Ambulate: 51 - 150 feet;with supervision;with rolling walker PT Goal: Ambulate - Progress: Met  Visit Information  Last PT Received On: 02/06/13 Assistance  Needed: +1    Subjective Data  Subjective: I am ready to go home   Cognition  Cognition Overall Cognitive Status: Appears within functional limits for tasks assessed/performed Arousal/Alertness: Awake/alert Orientation Level: Appears intact for tasks assessed Behavior During Session: [facility] for tasks performed    Balance     End of Session PT - End of Session Activity Tolerance: Patient tolerated treatment well Patient left: in chair;with family/visitor present Nurse Communication: Mobility status   GP     Rada Hay 02/06/2013, 12:10 PM

## 2013-02-06 NOTE — Progress Notes (Signed)
Patient and husband chose [facility]. Called Jacki Cones with [facility] to make aware of referral. However, home health PT services was not ordered but recommended. Called oncall for Dr Jeannetta Ellis office to request call back regarding orders for PT.  [place], [place] [phone]

## 2013-02-06 NOTE — Care Management Note (Signed)
Received referral for home health PT services. To bedside to give list of options for home health agencies. Husband wants to look over it first. He also wanted to speak with his niece first about who to choose for home health.  [place], [place] [phone]

## 2013-02-06 NOTE — Progress Notes (Signed)
Occupational Therapy Treatment Patient Details Name: FLEDA PAGEL MRN: [id number] DOB: 07-12-1925 Today's Date: 02/06/2013 Time: 6213-0865 OT Time Calculation (min): 52 min  OT Assessment / Plan / Recommendation Comments on Treatment Session Pt continues to need cues for back precautions.  Husband cues pt for standing straight.  HHOT will reinforce education.  Did not review shower transfer    Follow Up Recommendations  Home health OT    Barriers to Discharge       Equipment Recommendations  None recommended by OT    Recommendations for Other Services    Frequency Min 2X/week   Plan Discharge plan remains appropriate    Precautions / Restrictions Precautions Precautions: Back Restrictions Weight Bearing Restrictions: No   Pertinent Vitals/Pain Back sore    ADL  Grooming: Teeth care;Supervision/safety Where Assessed - Grooming: Supported standing Upper Body Bathing: Supervision/safety Where Assessed - Upper Body Bathing: Unsupported sitting Lower Body Bathing: Moderate assistance Where Assessed - Lower Body Bathing: Supported sit to stand Upper Body Dressing: Supervision/safety Where Assessed - Upper Body Dressing: Unsupported sitting Lower Body Dressing: Maximal assistance Where Assessed - Lower Body Dressing: Supported sit to stand Toilet Transfer: Hydrographic surveyor Method: Sit to Barista: Raised toilet seat with arms (or 3-in-1 over toilet) Toileting - Clothing Manipulation and Hygiene: Minimal assistance Where Assessed - Glass blower/designer Manipulation and Hygiene: Sit to stand from 3-in-1 or toilet Equipment Used:  (4 wheel walker) Transfers/Ambulation Related to ADLs:   ADL Comments: cues for bending during adls and transitional movements.  Pt recalls she should not bend but needs cues during functional activities and transitional movements   OT Diagnosis:    OT Problem List:   OT Treatment Interventions:     OT Goals ADL  Goals Pt Will Transfer to Toilet: with min assist;Ambulation;3-in-1 ADL Goal: Toilet Transfer - Progress: Met Pt Will Perform Toileting - Hygiene: with min assist;Sit to stand from 3-in-1/toilet ADL Goal: Toileting - Hygiene - Progress: Met Miscellaneous OT Goals Miscellaneous OT Goal #1: Pt will verbalize 3/3 back precautions and verbalize that she needs to keep back straight during sit to stand OT Goal: Miscellaneous Goal #1 - Progress: Progressing toward goals  Visit Information  Last OT Received On: 02/06/13 Assistance Needed: +1    Subjective Data      Prior Functioning       Cognition  Cognition Overall Cognitive Status: Appears within functional limits for tasks assessed/performed Arousal/Alertness: Awake/alert Orientation Level: Appears intact for tasks assessed Behavior During Session: [facility] for tasks performed    Mobility  Bed Mobility Bed Mobility: Rolling Right;Rolling Left;Left Sidelying to Sit;Sit to Sidelying Left Rolling Right: 4: Min assist Rolling Left: 4: Min assist Left Sidelying to Sit: 3: Mod assist Sit to Sidelying Left: 3: Mod assist Details for Bed Mobility Assistance: husband observed bed mobility Transfers Sit to Stand: 4: Min guard;From chair/3-in-1;From bed;With armrests;From elevated surface;With upper extremity assist Details for Transfer Assistance: cues for back precautions    Exercises      Balance     End of Session OT - End of Session Activity Tolerance: Patient tolerated treatment well Patient left: in chair;with call bell/phone within reach;with bed alarm set;with family/visitor present  GO     SPENCER,MARYELLEN 02/06/2013, 10:18 AM Marica Otter, OTR/L [phone] 02/06/2013

## 2013-02-06 NOTE — Care Management Note (Signed)
Spoke with Dr Charlann Boxer earlier who ordered [facility] services for patient. Jacki Cones with [facility] made aware.  [place], [place] [phone]

## 2013-02-06 NOTE — Progress Notes (Signed)
   Subjective: 2 Days Post-Op Procedure(s) (LRB): CENTRAL DECOMPRESSION/LUMBAR LAMINECTOMY L3-L4 AND L4-L5 2 LEVELS (N/A)   Patient reports pain as mild, pain well controlled. No events throughout the night. States that she is ready to be discharged home. Husband watches dressing change to understand how to change the dressing when he will have to do it.  Objective:   VITALS:   Filed Vitals:   02/06/13 0531  BP: 164/81  Pulse: 64  Temp: 98.6 F (37 C)  Resp: 18    Neurovascular intact Dorsiflexion/Plantar flexion intact Incision: dressing C/D/I No cellulitis present Compartment soft  LABS No results found for this basename: HGB, HCT, WBC, PLT,  in the last 72 hours  No results found for this basename: NA, K, BUN, CREATININE, GLUCOSE,  in the last 72 hours   Assessment/Plan: 2 Days Post-Op Procedure(s) (LRB): CENTRAL DECOMPRESSION/LUMBAR LAMINECTOMY L3-L4 AND L4-L5 2 LEVELS (N/A)   Up with therapy Discharge home with home health Follow up in 2 weeks at [facility]. Follow up with Dr. Darrelyn Hillock in 2 weeks.  Contact information:  [facility] [address] [place] [address] [phone]        Anastasio Auerbach. Babish   PAC  02/06/2013, 9:25 AM

## 2013-02-08 ENCOUNTER — Encounter (HOSPITAL_COMMUNITY): Payer: Self-pay | Admitting: Orthopedic Surgery

## 2013-02-08 NOTE — Discharge Summary (Signed)
Physician Discharge Summary   Patient ID: Tamara Gregory MRN: [id number] DOB/AGE: 08-Jun-1925 79 y.o.  Admit date: 02/04/2013 Discharge date: 02/06/2013  Primary Diagnosis: Spinal stenosis, lumbar spine   Admission Diagnoses:  Past Medical History  Diagnosis Date  . Hypertension   . Hyperlipidemia   . Depression   . History of migraines   . Vitamin D deficiency   . GERD (gastroesophageal reflux disease)   . Headache   . Arthritis   . Cancer     hx of skin cancer   . Macular degeneration   . Myasthenia gravis   . Thoracic aneurysm     4.4cm; see CT done 01/28/12 and 01/22/13 in [facility].   [name] Spinal stenosis    Discharge Diagnoses:   Active Problems:   Spinal stenosis, lumbar region, with neurogenic claudication  Estimated body mass index is 26.58 kg/(m^2) as calculated from the following:   Height as of this encounter: 5\' 3"  (1.6 m).   Weight as of this encounter: 68.04 kg (150 lb).  Procedure:  Procedure(s) (LRB): CENTRAL DECOMPRESSION/LUMBAR LAMINECTOMY L3-L4 AND L4-L5 2 LEVELS (N/A)   Consults: None  HPI: The patient is a 78 year old female who presents with back pain. The patient reports low back symptoms including pain, pain with extension and lt leg electric pain which began 1 month ago without any known injury. Symptoms are reported to be located in the left low back and Symptoms include pain, burning and weakness. The pain radiates to the left buttock, left posterior thigh and left lower leg. The patient describes the pain as burning, aching and throbbing. She has tried Ibuprofen for the pain which helps some. She states she cannot sleep. She states this pain started when she bent over and felt a pain that ran down her lt. Leg. She had a CT myelogram which showed that she has multi-level spinal stenosis, especially at L3-4 and L4-5. She has mild stenosis at L2-L3. She had an epidural block there at L4-5 on the left, and she had total relief for a day only.   Laboratory  Data: Admission on 02/04/2013, Discharged on 02/06/2013  Component Date Value Range Status  . ABO/RH(D) 02/04/2013 AB POS   Final  . Antibody Screen 02/04/2013 NEG   Final  . Sample Expiration 02/04/2013 02/07/2013   Final  . ABO/RH(D) 02/04/2013 AB POS   Final  Admission on 01/26/2013, Discharged on 01/28/2013  Component Date Value Range Status  . Total CK 01/26/2013 42  7 - 177 U/L Final  . CK, MB 01/26/2013 2.4  0.3 - 4.0 ng/mL Final  . Relative Index 01/26/2013 RELATIVE INDEX IS INVALID  0.0 - 2.5 Final   Comment: WHEN CK < 100 U/L                                  . Troponin I 01/26/2013 <0.30  <0.30 ng/mL Final   Comment:                                 Due to the release kinetics of cTnI,                          a negative result within the first hours  of the onset of symptoms does not rule out                          myocardial infarction with certainty.                          If myocardial infarction is still suspected,                          repeat the test at appropriate intervals.  . Troponin I 01/26/2013 <0.30  <0.30 ng/mL Final   Comment:                                 Due to the release kinetics of cTnI,                          a negative result within the first hours                          of the onset of symptoms does not rule out                          myocardial infarction with certainty.                          If myocardial infarction is still suspected,                          repeat the test at appropriate intervals.  . Troponin I 01/27/2013 <0.30  <0.30 ng/mL Final   Comment:                                 Due to the release kinetics of cTnI,                          a negative result within the first hours                          of the onset of symptoms does not rule out                          myocardial infarction with certainty.                          If myocardial infarction is still suspected,                           repeat the test at appropriate intervals.  . Sodium 01/27/2013 137  135 - 145 mEq/L Final  . Potassium 01/27/2013 3.4* 3.5 - 5.1 mEq/L Final  . Chloride 01/27/2013 100  96 - 112 mEq/L Final  . CO2 01/27/2013 28  19 - 32 mEq/L Final  . Glucose, Bld 01/27/2013 111* 70 - 99 mg/dL Final  . BUN 16/09/9603 14  6 - 23 mg/dL Final  . Creatinine, Ser 01/27/2013 0.58  0.50 - 1.10 mg/dL Final  . Calcium 54/08/8118  9.3  8.4 - 10.5 mg/dL Final  . GFR calc non Af Amer 01/27/2013 81* >90 mL/min Final  . GFR calc Af Amer 01/27/2013 >90  >90 mL/min Final   Comment:                                 The eGFR has been calculated                          using the CKD EPI equation.                          This calculation has not been                          validated in all clinical                          situations.                          eGFR's persistently                          <90 mL/min signify                          possible Chronic Kidney Disease.  Hospital Outpatient Visit on 01/21/2013  Component Date Value Range Status  . aPTT 01/21/2013 30  24 - 37 seconds Final  . Sodium 01/21/2013 143  135 - 145 mEq/L Final  . Potassium 01/21/2013 4.4  3.5 - 5.1 mEq/L Final  . Chloride 01/21/2013 103  96 - 112 mEq/L Final  . CO2 01/21/2013 28  19 - 32 mEq/L Final  . Glucose, Bld 01/21/2013 111* 70 - 99 mg/dL Final  . BUN 96/03/5408 24* 6 - 23 mg/dL Final  . Creatinine, Ser 01/21/2013 0.66  0.50 - 1.10 mg/dL Final  . Calcium 81/19/1478 10.1  8.4 - 10.5 mg/dL Final  . Total Protein 01/21/2013 7.5  6.0 - 8.3 g/dL Final  . Albumin 29/56/2130 4.3  3.5 - 5.2 g/dL Final  . AST 86/57/8469 14  0 - 37 U/L Final  . ALT 01/21/2013 20  0 - 35 U/L Final  . Alkaline Phosphatase 01/21/2013 81  39 - 117 U/L Final  . Total Bilirubin 01/21/2013 0.6  0.3 - 1.2 mg/dL Final  . GFR calc non Af Amer 01/21/2013 78* >90 mL/min Final  . GFR calc Af Amer 01/21/2013 >90  >90 mL/min Final   Comment:                                  The eGFR has been calculated                          using the CKD EPI equation.                          This calculation has not been  validated in all clinical                          situations.                          eGFR's persistently                          <90 mL/min signify                          possible Chronic Kidney Disease.  [name] Prothrombin Time 01/21/2013 13.2  11.6 - 15.2 seconds Final  . INR 01/21/2013 1.01  0.00 - 1.49 Final  . Color, Urine 01/21/2013 YELLOW  YELLOW Final  . APPearance 01/21/2013 CLEAR  CLEAR Final  . Specific Gravity, Urine 01/21/2013 1.033* 1.005 - 1.030 Final  . pH 01/21/2013 5.0  5.0 - 8.0 Final  . Glucose, UA 01/21/2013 NEGATIVE  NEGATIVE mg/dL Final  . Hgb urine dipstick 01/21/2013 NEGATIVE  NEGATIVE Final  . Bilirubin Urine 01/21/2013 NEGATIVE  NEGATIVE Final  . Ketones, ur 01/21/2013 NEGATIVE  NEGATIVE mg/dL Final  . Protein, ur 16/09/9603 NEGATIVE  NEGATIVE mg/dL Final  . Urobilinogen, UA 01/21/2013 0.2  0.0 - 1.0 mg/dL Final  . Nitrite 54/08/8118 NEGATIVE  NEGATIVE Final  . Leukocytes, UA 01/21/2013 SMALL* NEGATIVE Final  . MRSA, PCR 01/21/2013 NEGATIVE  NEGATIVE Final  . Staphylococcus aureus 01/21/2013 NEGATIVE  NEGATIVE Final   Comment:                                 The Xpert SA Assay (FDA                          approved for NASAL specimens                          in patients over 8 years of age),                          is one component of                          a comprehensive surveillance                          program.  Test performance has                          been validated by [employer] for patients greater                          than or equal to 65 year old.                          It is not intended  to diagnose infection nor to                          guide or monitor treatment.  . WBC 01/21/2013 9.3   4.0 - 10.5 K/uL Final  . RBC 01/21/2013 4.56  3.87 - 5.11 MIL/uL Final  . Hemoglobin 01/21/2013 14.2  12.0 - 15.0 g/dL Final  . HCT 40/98/1191 43.9  36.0 - 46.0 % Final  . MCV 01/21/2013 96.3  78.0 - 100.0 fL Final  . MCH 01/21/2013 31.1  26.0 - 34.0 pg Final  . MCHC 01/21/2013 32.3  30.0 - 36.0 g/dL Final  . RDW 47/82/9562 13.0  11.5 - 15.5 % Final  . Platelets 01/21/2013 284  150 - 400 K/uL Final  . Squamous Epithelial / LPF 01/21/2013 RARE  RARE Final  . WBC, UA 01/21/2013 3-6  <3 WBC/hpf Final  . Crystals 01/21/2013 URIC ACID CRYSTALS* NEGATIVE Final     X-Rays:Dg Chest 2 View  01/21/2013  *RADIOLOGY REPORT*  Clinical Data: Preoperative films.  Patient for lumbar surgery.  CHEST - 2 VIEW  Comparison: CT chest 01/28/2012.  Findings: Lungs are clear.  There is cardiomegaly.  The aorta is tortuous.  No pneumothorax or pleural effusion.  IMPRESSION: Cardiomegaly without acute disease.   Original Report Authenticated By: Holley Dexter, M.D.    Dg Lumbar Spine 2-3 Views  01/21/2013  *RADIOLOGY REPORT*  Clinical Data: Preoperative films.  Patient for lumbar surgery.  LUMBAR SPINE - 2-3 VIEW  Comparison: Post-myelogram CT scan 12/31/2012.  Findings: Vertebral body height is maintained.  Convex left scoliosis is identified.  There is marked loss of disc space height at L3-4 and L4-5.  Lower lumbar facet degenerative disease is also identified.  IMPRESSION:  1.  No acute finding. 2.  Scoliosis and degenerative disease.   Original Report Authenticated By: Holley Dexter, M.D.    Ct Angio Chest W/cm &/or Wo Cm  01/22/2013  *RADIOLOGY REPORT*  Clinical Data: Evaluate aneurysm.  Patient having back surgery on Tuesday.  CT ANGIOGRAPHY CHEST  Technique:  Multidetector CT imaging of the chest using the standard protocol during bolus administration of intravenous contrast. Multiplanar reconstructed images including MIPs were obtained and reviewed to evaluate the vascular anatomy.  Contrast:  OMNIPAQUE IOHEXOL 350 MG/ML SOLN  Comparison: 01/28/2012.  Findings: Ascending aortic aneurysm measures 44 mm.  The arch measures 4 cm with ragged mural plaque with ulcerations.  There is no dissection or acute aortic abnormality.  Descending thoracic aorta demonstrates atherosclerosis with mural plaque and calcification.  There is a moderate hiatal hernia present.  There is no pericardial effusion.  Incidental imaging of the upper abdomen demonstrates cholecystectomy.  There is post cholecystectomy, mild duct dilation.  Thoracic spondylosis is present.  There is respiratory motion on the examination.  The lungs show no airspace disease. Mild bilateral pleural apical scarring.  No axillary, mediastinal, or hilar adenopathy.  IMPRESSION: Unchanged thoracic aneurysm with maximal measurement 44 mm.  Severe atherosclerosis with ulcerated plaque along the arch but no acute abnormalities.   Original Report Authenticated By: Andreas Newport, M.D.    Nm Myocar Multi W/spect W/wall Motion / Ef  01/27/2013  *RADIOLOGY REPORT*  Clinical Data:  Chest pain  MYOCARDIAL IMAGING WITH SPECT (REST AND PHARMACOLOGIC-STRESS) GATED LEFT VENTRICULAR WALL MOTION STUDY LEFT VENTRICULAR EJECTION FRACTION  Technique:  Standard myocardial SPECT imaging was performed after resting intravenous injection of 10 mCi Tc-76m sestamibi. Subsequently, intravenous infusion of Lexiscan was  performed under the supervision of the Cardiology staff.  At peak effect of the drug, 30 mCi Tc-49m sestamibi was injected intravenously and standard myocardial SPECT  imaging was performed.  Quantitative gated imaging was also performed to evaluate left ventricular wall motion, and estimate left ventricular ejection fraction.  Comparison:  None  Findings: SPECT imaging demonstrates no reversible or irreversible defects to suggest ischemia or infarction.  Quantitative gated analysis shows normal wall motion.  The resting left ventricular ejection fraction is 69%  with end- diastolic volume of 55 ml and end-systolic volume of 17 ml.  IMPRESSION: No evidence of ischemia or infarction ejection fraction 69%.   Original Report Authenticated By: Charlett Nose, M.D.    Dg Spine Portable 1 View  02/04/2013  *RADIOLOGY REPORT*  Clinical Data: Spine decompression.  PORTABLE SPINE - 1 VIEW  Comparison: None.  Findings: Portable film #3.  The tip of the instrument directed most superiorly lies opposite the pedicle of L4.  The tip of the instrument directed inferiorly lies opposite the pedicle of L5.  IMPRESSION: L4 and L5 marked.   Original Report Authenticated By: Davonna Belling, M.D.    Dg Spine Portable 1 View  02/04/2013  *RADIOLOGY REPORT*  Clinical Data: L3-5 lumbar surgery.  PORTABLE SPINE - 1 VIEW  Comparison: Intraoperative view of the lumbar spine 02/04/2013 at 1633 hours.  Findings: Two probes are identified.  The more superior is directed toward the L3-4 interspace.  The more inferior probe is directed toward the L4 pedicles.  IMPRESSION: Localization as above.   Original Report Authenticated By: Holley Dexter, M.D.    Dg Spine Portable 1 View  02/04/2013  *RADIOLOGY REPORT*  Clinical Data: Back pain  PORTABLE SPINE - 1 VIEW  Comparison: Multiple priors  Findings: Cross-table lateral film #1 at 1630 hours demonstrates one needle above the spinous process of L5 and one needle below the spinous process of L5.  IMPRESSION: L5 marked.l   Original Report Authenticated By: Davonna Belling, M.D.     EKG: Orders placed during the hospital encounter of 01/26/13  . EKG 12-LEAD  . EKG 12-LEAD  . EKG 12-LEAD  . EKG 12-LEAD  . EXERCISE TOLERANCE TEST  . EXERCISE TOLERANCE TEST  . EKG  . EKG  . EKG     Hospital Course: Tamara Gregory is a 79 y.o. who was admitted to [facility]. They were brought to the operating room on 02/04/2013 and underwent Procedure(s): CENTRAL DECOMPRESSION/LUMBAR LAMINECTOMY L3-L4 AND L4-L5 2 LEVELS.  Patient tolerated the procedure well  and was later transferred to the recovery room and then to the orthopaedic floor for postoperative care.  They were given PO and IV analgesics for pain control following their surgery.  They were given 24 hours of postoperative antibiotics of  Anti-infectives   Start     Dose/Rate Route Frequency Ordered Stop   02/04/13 2200  ceFAZolin (ANCEF) IVPB 1 g/50 mL premix     1 g 100 mL/hr over 30 Minutes Intravenous 3 times per day 02/04/13 1951 02/05/13 2159   02/04/13 1641  polymyxin B 500,000 Units, bacitracin 50,000 Units in sodium chloride irrigation 0.9 % 500 mL irrigation  Status:  Discontinued       As needed 02/04/13 1641 02/04/13 1815   02/04/13 1315  ceFAZolin (ANCEF) IVPB 2 g/50 mL premix     2 g 100 mL/hr over 30 Minutes Intravenous On call to O.R. 02/04/13 1306 02/04/13 1607     and started on DVT  prophylaxis in the form of Aspirin.   PT was ordered for ambulation assistance.  Discharge planning consulted to help with postop disposition and equipment needs.  Patient had a difficult night on the evening of surgery due to pain.  They started to get up OOB with therapy on day one. Continued to work with therapy into day two.  Dressing was changed on day two and the incision was clean and dry. Incision was healing well.  Patient was seen in rounds and was ready to go home.   Discharge Medications: Prior to Admission medications   Medication Sig Start Date End Date Taking? Authorizing Provider  amLODipine (NORVASC) 5 MG tablet Take 10 mg by mouth every morning.    Yes Historical Provider, MD  beta carotene 16109 UNIT capsule Take 25,000 Units by mouth daily.    Yes Historical Provider, MD  bisacodyl (DULCOLAX) 5 MG EC tablet Take 5 mg by mouth daily as needed for constipation.   Yes Historical Provider, MD  Calcium Carbonate (CALCIUM 500 PO) Take 1 capsule by mouth daily.    Yes Historical Provider, MD  cholecalciferol (VITAMIN D) 1000 UNITS tablet Take 1,000 Units by mouth daily.    Yes  Historical Provider, MD  citalopram (CELEXA) 20 MG tablet Take 20 mg by mouth daily before breakfast.   Yes Historical Provider, MD  folic acid (FOLVITE) 400 MCG tablet Take 400 mcg by mouth daily.   Yes Historical Provider, MD  Glucosamine-Chondroit-Vit C-Mn (GLUCOSAMINE 1500 COMPLEX PO) Take by mouth daily.    Yes Historical Provider, MD  losartan-hydrochlorothiazide (HYZAAR) 100-25 MG per tablet Take 1 tablet by mouth daily before breakfast.   Yes Historical Provider, MD  LYSINE PO Take 1 tablet by mouth daily.    Yes Historical Provider, MD  metoprolol tartrate (LOPRESSOR) 25 MG tablet 12.5 mg 2 (two) times daily. 1/2 tablet twice a day 01/28/13  Yes Cassell Clement, MD  Multiple Vitamin (MULTIVITAMIN) tablet Take 1 tablet by mouth daily.    Yes Historical Provider, MD  Multiple Vitamins-Minerals (PRESERVISION AREDS) CAPS Take 2 capsules by mouth daily.   Yes Historical Provider, MD  Omega-3 Fatty Acids (FISH OIL) 435 MG CAPS Take 1 capsule by mouth daily.   Yes Historical Provider, MD  omeprazole (PRILOSEC) 20 MG capsule Take 20 mg by mouth daily.    Yes Historical Provider, MD  potassium chloride (K-DUR,KLOR-CON) 10 MEQ tablet Take 20 mEq by mouth daily.   Yes Historical Provider, MD  pravastatin (PRAVACHOL) 40 MG tablet Take 40 mg by mouth daily before breakfast.   Yes Historical Provider, MD  predniSONE (DELTASONE) 10 MG tablet Take 1 tablet by mouth daily before breakfast. 10/10/12  Yes Historical Provider, MD  aspirin EC 81 MG tablet Take 81 mg by mouth daily.    Historical Provider, MD  methocarbamol (ROBAXIN) 500 MG tablet Take 1 tablet (500 mg total) by mouth every 6 (six) hours as needed. 02/05/13   Besnik Febus Tamala Ser, PA-C  oxyCODONE-acetaminophen (PERCOCET) 10-325 MG per tablet Take 1 tablet by mouth every 4 (four) hours as needed for pain. 02/05/13   Teola Felipe Tamala Ser, PA-C    Diet: Cardiac diet Activity:WBAT Follow-up:in 2 weeks Disposition - Home Discharged Condition:  fair   Discharge Orders   Future Appointments Provider Department Dept Phone   04/07/2013 8:30 AM Cassell Clement, MD [facility] Main Office [place]) [phone]   04/07/2013 8:55 AM [facility] [employer] Main Office [place]) [phone]   Future Orders Complete By Expires  Call MD / Call 911  As directed     Comments:      If you experience chest pain or shortness of breath, CALL 911 and be transported to the hospital emergency room.  If you develope a fever above 101 F, pus (white drainage) or increased drainage or redness at the wound, or calf pain, call your surgeon's office.    Constipation Prevention  As directed     Comments:      Drink plenty of fluids.  Prune juice may be helpful.  You may use a stool softener, such as Colace (over the counter) 100 mg twice a day.  Use MiraLax (over the counter) for constipation as needed.    Diet - low sodium heart healthy  As directed     Discharge instructions  As directed     Comments:      Change your dressing daily. Shower only, no tub bath. Call if any temperatures greater than 101 or any wound complications: [phone] during the day and ask for Dr. Jeannetta Ellis nurse, [place].    Driving restrictions  As directed     Comments:      No driving    Increase activity slowly as tolerated  As directed     Lifting restrictions  As directed     Comments:      No lifting        Medication List    TAKE these medications       amLODipine 5 MG tablet  Commonly known as:  NORVASC  Take 10 mg by mouth every morning.     aspirin EC 81 MG tablet  Take 81 mg by mouth daily.     beta carotene 16109 UNIT capsule  Take 25,000 Units by mouth daily.     bisacodyl 5 MG EC tablet  Commonly known as:  DULCOLAX  Take 5 mg by mouth daily as needed for constipation.     CALCIUM 500 PO  Take 1 capsule by mouth daily.     cholecalciferol 1000 UNITS tablet  Commonly known as:  VITAMIN D  Take 1,000 Units by mouth daily.      citalopram 20 MG tablet  Commonly known as:  CELEXA  Take 20 mg by mouth daily before breakfast.     Fish Oil 435 MG Caps  Take 1 capsule by mouth daily.     folic acid 400 MCG tablet  Commonly known as:  FOLVITE  Take 400 mcg by mouth daily.     GLUCOSAMINE 1500 COMPLEX PO  Take by mouth daily.     losartan-hydrochlorothiazide 100-25 MG per tablet  Commonly known as:  HYZAAR  Take 1 tablet by mouth daily before breakfast.     LYSINE PO  Take 1 tablet by mouth daily.     methocarbamol 500 MG tablet  Commonly known as:  ROBAXIN  Take 1 tablet (500 mg total) by mouth every 6 (six) hours as needed.     metoprolol tartrate 25 MG tablet  Commonly known as:  LOPRESSOR  12.5 mg 2 (two) times daily. 1/2 tablet twice a day     multivitamin tablet  Take 1 tablet by mouth daily.     omeprazole 20 MG capsule  Commonly known as:  PRILOSEC  Take 20 mg by mouth daily.     oxyCODONE-acetaminophen 10-325 MG per tablet  Commonly known as:  PERCOCET  Take 1 tablet by mouth every 4 (four) hours as needed for pain.  potassium chloride 10 MEQ tablet  Commonly known as:  K-DUR,KLOR-CON  Take 20 mEq by mouth daily.     pravastatin 40 MG tablet  Commonly known as:  PRAVACHOL  Take 40 mg by mouth daily before breakfast.     predniSONE 10 MG tablet  Commonly known as:  DELTASONE  Take 1 tablet by mouth daily before breakfast.     PreserVision AREDS Caps  Take 2 capsules by mouth daily.           Follow-up Information   Follow up with GIOFFRE,RONALD A, MD. Schedule an appointment as soon as possible for a visit in 2 weeks.   Contact information:   [address] [place] [address] [phone]       Follow up with [facility]. (home health PT services)    Contact information:   [phone]      Signed: Celedonio Savage Maclane Holloran LAUREN 02/08/2013, 11:13 AM

## 2013-02-10 DIAGNOSIS — Z4789 Encounter for other orthopedic aftercare: Secondary | ICD-10-CM | POA: Diagnosis not present

## 2013-02-10 DIAGNOSIS — H3530 Unspecified macular degeneration: Secondary | ICD-10-CM | POA: Diagnosis not present

## 2013-02-10 DIAGNOSIS — F329 Major depressive disorder, single episode, unspecified: Secondary | ICD-10-CM | POA: Diagnosis not present

## 2013-02-10 DIAGNOSIS — IMO0001 Reserved for inherently not codable concepts without codable children: Secondary | ICD-10-CM | POA: Diagnosis not present

## 2013-02-10 DIAGNOSIS — I10 Essential (primary) hypertension: Secondary | ICD-10-CM | POA: Diagnosis not present

## 2013-02-11 ENCOUNTER — Telehealth: Payer: Self-pay | Admitting: Cardiology

## 2013-02-11 DIAGNOSIS — R110 Nausea: Secondary | ICD-10-CM

## 2013-02-11 MED ORDER — ONDANSETRON HCL 4 MG PO TABS: 4.0000 mg | ORAL_TABLET | Freq: Three times a day (TID) | ORAL | Status: DC | PRN

## 2013-02-11 NOTE — Telephone Encounter (Signed)
New Problem:    Patient called in because the patient is having nausea and would like Dr. Patty Sermons to prescribe something for it.  Please call back.

## 2013-02-11 NOTE — Telephone Encounter (Signed)
Patient did have vomiting but none now. She is just very nauseated and does not want to eat or do anything at all. Physical therapy is coming out to work with patient. Has appointment Monday. Will forward to  Dr. Patty Sermons for review

## 2013-02-11 NOTE — Progress Notes (Signed)
Appointment scheduled.

## 2013-02-11 NOTE — Telephone Encounter (Signed)
Discussed with  Dr. Patty Sermons and will Rx Zofran 4 mg ever 8 hours as needed.  Per husband patient did stop Percocet yesterday and not much better

## 2013-02-12 DIAGNOSIS — H3530 Unspecified macular degeneration: Secondary | ICD-10-CM | POA: Diagnosis not present

## 2013-02-12 DIAGNOSIS — IMO0001 Reserved for inherently not codable concepts without codable children: Secondary | ICD-10-CM | POA: Diagnosis not present

## 2013-02-12 DIAGNOSIS — F329 Major depressive disorder, single episode, unspecified: Secondary | ICD-10-CM | POA: Diagnosis not present

## 2013-02-12 DIAGNOSIS — Z4789 Encounter for other orthopedic aftercare: Secondary | ICD-10-CM | POA: Diagnosis not present

## 2013-02-12 DIAGNOSIS — I10 Essential (primary) hypertension: Secondary | ICD-10-CM | POA: Diagnosis not present

## 2013-02-15 ENCOUNTER — Ambulatory Visit (INDEPENDENT_AMBULATORY_CARE_PROVIDER_SITE_OTHER): Payer: Medicare Other | Admitting: Cardiology

## 2013-02-15 ENCOUNTER — Encounter: Payer: Self-pay | Admitting: Cardiology

## 2013-02-15 VITALS — BP 122/74 | HR 66 | Ht 63.0 in | Wt 152.0 lb

## 2013-02-15 DIAGNOSIS — I10 Essential (primary) hypertension: Secondary | ICD-10-CM | POA: Diagnosis not present

## 2013-02-15 DIAGNOSIS — Z4789 Encounter for other orthopedic aftercare: Secondary | ICD-10-CM | POA: Diagnosis not present

## 2013-02-15 DIAGNOSIS — F32A Depression, unspecified: Secondary | ICD-10-CM

## 2013-02-15 DIAGNOSIS — I119 Hypertensive heart disease without heart failure: Secondary | ICD-10-CM

## 2013-02-15 DIAGNOSIS — E7800 Pure hypercholesterolemia, unspecified: Secondary | ICD-10-CM

## 2013-02-15 DIAGNOSIS — IMO0001 Reserved for inherently not codable concepts without codable children: Secondary | ICD-10-CM | POA: Diagnosis not present

## 2013-02-15 DIAGNOSIS — F329 Major depressive disorder, single episode, unspecified: Secondary | ICD-10-CM | POA: Diagnosis not present

## 2013-02-15 DIAGNOSIS — H3530 Unspecified macular degeneration: Secondary | ICD-10-CM | POA: Diagnosis not present

## 2013-02-15 MED ORDER — CITALOPRAM HYDROBROMIDE 40 MG PO TABS: 40.0000 mg | ORAL_TABLET | Freq: Every day | ORAL | Status: DC

## 2013-02-15 NOTE — Assessment & Plan Note (Signed)
The patient is on long-term low-dose prednisone for her myasthenia gravis

## 2013-02-15 NOTE — Assessment & Plan Note (Signed)
The patient has felt more depressed.  We are going to increase her citalopram from 20 mg up to 40 mg daily

## 2013-02-15 NOTE — Progress Notes (Signed)
Tamara Gregory Date of Birth:  [birth year] [facility] [address] [place], [place]  [address] [phone]         Fax   [phone]  History of Present Illness: This pleasant 79 year old woman is seen for a post hospital followup office visit. She has a past history of hypercholesterolemia and essential hypertension. She has a history of myasthenia gravis and is followed for that at [facility].  Since we last saw her in our office she has had successful back surgery by Dr. Darrelyn Hillock.  She has a known for 0.4 cm fusiform ascending aortic aneurysm which is unchanged over the past year.  When the patient was initially admitted for her back surgery she had some chest pain in the preoperative holding area and her back surgery had to be postponed while she underwent a cardiac workup.  She had a Myoview stress test which showed no ischemia.  Her back surgery was subsequently rescheduled and performed without incident.  She is making a good recovery from her back surgery.  She is having no back pain.  She was still having some mild residual left leg discomfort.  She still feels weak and has had some mild nausea which is improved since she stopped taking Percocet.  She was using ibuprofen for her mild discomfort.  She has used Zofran if necessary for nausea.  She has a past history of depression which has been worse postop.  She has been having some mild constipation.  She has never had a colonoscopy.   Current Outpatient Prescriptions  Medication Sig Dispense Refill  . amLODipine (NORVASC) 5 MG tablet Take 10 mg by mouth every morning.       [name] aspirin EC 81 MG tablet Take 81 mg by mouth 2 (two) times daily.       . beta carotene 52841 UNIT capsule Take 25,000 Units by mouth daily.       . bisacodyl (DULCOLAX) 5 MG EC tablet Take 5 mg by mouth daily as needed for constipation.      . Calcium Carbonate (CALCIUM 500 PO) Take 1 capsule by mouth daily.       . cholecalciferol  (VITAMIN D) 1000 UNITS tablet Take 1,000 Units by mouth daily.       . citalopram (CELEXA) 40 MG tablet Take 1 tablet (40 mg total) by mouth daily before breakfast.  90 tablet  3  . folic acid (FOLVITE) 400 MCG tablet Take 400 mcg by mouth daily.      . Glucosamine-Chondroit-Vit C-Mn (GLUCOSAMINE 1500 COMPLEX PO) Take by mouth daily.       [name] losartan-hydrochlorothiazide (HYZAAR) 100-25 MG per tablet Take 1 tablet by mouth daily before breakfast.      . LYSINE PO Take 1 tablet by mouth daily.       . methocarbamol (ROBAXIN) 500 MG tablet Take 1 tablet (500 mg total) by mouth every 6 (six) hours as needed.  40 tablet  1  . metoprolol tartrate (LOPRESSOR) 25 MG tablet 12.5 mg 2 (two) times daily. 1/2 tablet twice a day      . Multiple Vitamin (MULTIVITAMIN) tablet Take 1 tablet by mouth daily.       . Multiple Vitamins-Minerals (PRESERVISION AREDS) CAPS Take 2 capsules by mouth daily.      . Omega-3 Fatty Acids (FISH OIL) 435 MG CAPS Take 1 capsule by mouth daily.      [name] omeprazole (PRILOSEC) 20 MG capsule Take 20 mg by mouth  daily.       . ondansetron (ZOFRAN) 4 MG tablet Take 1 tablet (4 mg total) by mouth every 8 (eight) hours as needed for nausea.  15 tablet  0  . potassium chloride (K-DUR,KLOR-CON) 10 MEQ tablet Take 20 mEq by mouth daily.      . pravastatin (PRAVACHOL) 40 MG tablet Take 40 mg by mouth daily before breakfast.      . predniSONE (DELTASONE) 10 MG tablet Take 1 tablet by mouth daily before breakfast.      . Psyllium (METAMUCIL PO) Take by mouth. 15 cc daily      . oxyCODONE-acetaminophen (PERCOCET) 10-325 MG per tablet Take 1 tablet by mouth every 4 (four) hours as needed for pain.  60 tablet  0   No current facility-administered medications for this visit.    No Known Allergies  Patient Active Problem List  Diagnosis  . Pure hypercholesterolemia  . Benign hypertensive heart disease without heart failure  . Osteoarthritis  . Myasthenia gravis  . Spinal stenosis, lumbar  region, with neurogenic claudication    History  Smoking status  . Never Smoker   Smokeless tobacco  . Never Used    History  Alcohol Use No    Family History  Problem Relation Age of Onset  . Heart attack Father   . Cancer Sister   . Cancer Brother   . Cancer Sister   . Cancer Sister     Review of Systems: Constitutional: no fever chills diaphoresis or fatigue or change in weight.  Head and neck: no hearing loss, no epistaxis, no photophobia or visual disturbance. Respiratory: No cough, shortness of breath or wheezing. Cardiovascular: No chest pain peripheral edema, palpitations. Gastrointestinal: No abdominal distention, no abdominal pain, no change in bowel habits hematochezia or melena. Genitourinary: No dysuria, no frequency, no urgency, no nocturia. Musculoskeletal:No arthralgias, no back pain, no gait disturbance or myalgias. Neurological: No dizziness, no headaches, no numbness, no seizures, no syncope, no weakness, no tremors. Hematologic: No lymphadenopathy, no easy bruising. Psychiatric: No confusion, no hallucinations, no sleep disturbance.    Physical Exam: Filed Vitals:   02/15/13 1139  BP: 122/74  Pulse: 66   the general appearance reveals a well-developed well-nourished woman in no distress.  She is able to get up on the examining table without difficulty.The head and neck exam reveals pupils equal and reactive.  Extraocular movements are full.  There is no scleral icterus.  The mouth and pharynx are normal.  The neck is supple.  The carotids reveal no bruits.  The jugular venous pressure is normal.  The  thyroid is not enlarged.  There is no lymphadenopathy.  The chest is clear to percussion and auscultation.  There are no rales or rhonchi.  Expansion of the chest is symmetrical.  The precordium is quiet.  The first heart sound is normal.  The second heart sound is physiologically split.  There is no murmur gallop rub or click.  There is no abnormal lift or  heave.  The abdomen is soft and nontender.  The bowel sounds are normal.  The liver and spleen are not enlarged.  There are no abdominal masses.  There are no abdominal bruits.  Extremities reveal good pedal pulses.  There is no phlebitis or edema.  There is no cyanosis or clubbing.  Strength is normal and symmetrical in all extremities.  There is no lateralizing weakness.  There are no sensory deficits.  The skin is warm and dry.  There  is no rash.     Assessment / Plan: We are going to increase her Celexa to 40 mg daily to help with depression. We talked about the eventual need for colonoscopy after she is over her back surgery a little further.  For now we are adding Metamucil 15 cc daily to her regimen. Recheck in 6-8 weeks for followup office visit and we should get fasting lipid panel and chemistries at that time.

## 2013-02-15 NOTE — Assessment & Plan Note (Signed)
Her blood pressure was remaining stable on current medication.  No chest pain

## 2013-02-15 NOTE — Assessment & Plan Note (Signed)
The patient has a history of hypercholesterolemia and is on pravastatin 40 mg daily

## 2013-02-15 NOTE — Patient Instructions (Signed)
Increase your Celexa to 40 mg daily, double up on current pills and new Rx for 40 mg tablets has been sent to your pharmacy.    Add Metamucil 15 cc daily for constipation   Follow up appointment on same day as your husband in about 6-8 weeks

## 2013-02-17 DIAGNOSIS — I10 Essential (primary) hypertension: Secondary | ICD-10-CM | POA: Diagnosis not present

## 2013-02-17 DIAGNOSIS — F329 Major depressive disorder, single episode, unspecified: Secondary | ICD-10-CM | POA: Diagnosis not present

## 2013-02-17 DIAGNOSIS — H3530 Unspecified macular degeneration: Secondary | ICD-10-CM | POA: Diagnosis not present

## 2013-02-17 DIAGNOSIS — Z4789 Encounter for other orthopedic aftercare: Secondary | ICD-10-CM | POA: Diagnosis not present

## 2013-02-17 DIAGNOSIS — IMO0001 Reserved for inherently not codable concepts without codable children: Secondary | ICD-10-CM | POA: Diagnosis not present

## 2013-02-19 ENCOUNTER — Other Ambulatory Visit: Payer: Self-pay

## 2013-02-19 DIAGNOSIS — H3530 Unspecified macular degeneration: Secondary | ICD-10-CM | POA: Diagnosis not present

## 2013-02-19 DIAGNOSIS — F329 Major depressive disorder, single episode, unspecified: Secondary | ICD-10-CM | POA: Diagnosis not present

## 2013-02-19 DIAGNOSIS — IMO0001 Reserved for inherently not codable concepts without codable children: Secondary | ICD-10-CM | POA: Diagnosis not present

## 2013-02-19 DIAGNOSIS — Z4789 Encounter for other orthopedic aftercare: Secondary | ICD-10-CM | POA: Diagnosis not present

## 2013-02-19 DIAGNOSIS — I10 Essential (primary) hypertension: Secondary | ICD-10-CM | POA: Diagnosis not present

## 2013-02-19 MED ORDER — AMLODIPINE BESYLATE 5 MG PO TABS: 10.0000 mg | ORAL_TABLET | Freq: Every morning | ORAL | Status: DC

## 2013-02-22 DIAGNOSIS — F329 Major depressive disorder, single episode, unspecified: Secondary | ICD-10-CM | POA: Diagnosis not present

## 2013-02-22 DIAGNOSIS — IMO0001 Reserved for inherently not codable concepts without codable children: Secondary | ICD-10-CM | POA: Diagnosis not present

## 2013-02-22 DIAGNOSIS — I10 Essential (primary) hypertension: Secondary | ICD-10-CM | POA: Diagnosis not present

## 2013-02-22 DIAGNOSIS — Z4789 Encounter for other orthopedic aftercare: Secondary | ICD-10-CM | POA: Diagnosis not present

## 2013-02-22 DIAGNOSIS — H3530 Unspecified macular degeneration: Secondary | ICD-10-CM | POA: Diagnosis not present

## 2013-03-02 DIAGNOSIS — I10 Essential (primary) hypertension: Secondary | ICD-10-CM | POA: Diagnosis not present

## 2013-03-02 DIAGNOSIS — F329 Major depressive disorder, single episode, unspecified: Secondary | ICD-10-CM | POA: Diagnosis not present

## 2013-03-02 DIAGNOSIS — Z4789 Encounter for other orthopedic aftercare: Secondary | ICD-10-CM | POA: Diagnosis not present

## 2013-03-02 DIAGNOSIS — H3530 Unspecified macular degeneration: Secondary | ICD-10-CM | POA: Diagnosis not present

## 2013-03-02 DIAGNOSIS — IMO0001 Reserved for inherently not codable concepts without codable children: Secondary | ICD-10-CM | POA: Diagnosis not present

## 2013-03-04 DIAGNOSIS — I10 Essential (primary) hypertension: Secondary | ICD-10-CM | POA: Diagnosis not present

## 2013-03-04 DIAGNOSIS — IMO0001 Reserved for inherently not codable concepts without codable children: Secondary | ICD-10-CM | POA: Diagnosis not present

## 2013-03-04 DIAGNOSIS — F329 Major depressive disorder, single episode, unspecified: Secondary | ICD-10-CM | POA: Diagnosis not present

## 2013-03-04 DIAGNOSIS — H3530 Unspecified macular degeneration: Secondary | ICD-10-CM | POA: Diagnosis not present

## 2013-03-04 DIAGNOSIS — Z4789 Encounter for other orthopedic aftercare: Secondary | ICD-10-CM | POA: Diagnosis not present

## 2013-03-10 DIAGNOSIS — Z4789 Encounter for other orthopedic aftercare: Secondary | ICD-10-CM | POA: Diagnosis not present

## 2013-03-10 DIAGNOSIS — IMO0001 Reserved for inherently not codable concepts without codable children: Secondary | ICD-10-CM | POA: Diagnosis not present

## 2013-03-10 DIAGNOSIS — F329 Major depressive disorder, single episode, unspecified: Secondary | ICD-10-CM | POA: Diagnosis not present

## 2013-03-10 DIAGNOSIS — I10 Essential (primary) hypertension: Secondary | ICD-10-CM | POA: Diagnosis not present

## 2013-03-10 DIAGNOSIS — H3530 Unspecified macular degeneration: Secondary | ICD-10-CM | POA: Diagnosis not present

## 2013-03-11 DIAGNOSIS — I10 Essential (primary) hypertension: Secondary | ICD-10-CM | POA: Diagnosis not present

## 2013-03-11 DIAGNOSIS — Z4789 Encounter for other orthopedic aftercare: Secondary | ICD-10-CM | POA: Diagnosis not present

## 2013-03-11 DIAGNOSIS — F329 Major depressive disorder, single episode, unspecified: Secondary | ICD-10-CM | POA: Diagnosis not present

## 2013-03-11 DIAGNOSIS — H3530 Unspecified macular degeneration: Secondary | ICD-10-CM | POA: Diagnosis not present

## 2013-03-11 DIAGNOSIS — IMO0001 Reserved for inherently not codable concepts without codable children: Secondary | ICD-10-CM | POA: Diagnosis not present

## 2013-03-15 DIAGNOSIS — IMO0001 Reserved for inherently not codable concepts without codable children: Secondary | ICD-10-CM | POA: Diagnosis not present

## 2013-03-15 DIAGNOSIS — F329 Major depressive disorder, single episode, unspecified: Secondary | ICD-10-CM | POA: Diagnosis not present

## 2013-03-15 DIAGNOSIS — H3530 Unspecified macular degeneration: Secondary | ICD-10-CM | POA: Diagnosis not present

## 2013-03-15 DIAGNOSIS — I10 Essential (primary) hypertension: Secondary | ICD-10-CM | POA: Diagnosis not present

## 2013-03-15 DIAGNOSIS — Z4789 Encounter for other orthopedic aftercare: Secondary | ICD-10-CM | POA: Diagnosis not present

## 2013-03-16 DIAGNOSIS — F329 Major depressive disorder, single episode, unspecified: Secondary | ICD-10-CM | POA: Diagnosis not present

## 2013-03-16 DIAGNOSIS — Z4789 Encounter for other orthopedic aftercare: Secondary | ICD-10-CM | POA: Diagnosis not present

## 2013-03-16 DIAGNOSIS — H3530 Unspecified macular degeneration: Secondary | ICD-10-CM | POA: Diagnosis not present

## 2013-03-16 DIAGNOSIS — I10 Essential (primary) hypertension: Secondary | ICD-10-CM | POA: Diagnosis not present

## 2013-03-16 DIAGNOSIS — IMO0001 Reserved for inherently not codable concepts without codable children: Secondary | ICD-10-CM | POA: Diagnosis not present

## 2013-03-24 DIAGNOSIS — I10 Essential (primary) hypertension: Secondary | ICD-10-CM | POA: Diagnosis not present

## 2013-03-24 DIAGNOSIS — H3530 Unspecified macular degeneration: Secondary | ICD-10-CM | POA: Diagnosis not present

## 2013-03-24 DIAGNOSIS — Z4789 Encounter for other orthopedic aftercare: Secondary | ICD-10-CM | POA: Diagnosis not present

## 2013-03-24 DIAGNOSIS — IMO0001 Reserved for inherently not codable concepts without codable children: Secondary | ICD-10-CM | POA: Diagnosis not present

## 2013-03-24 DIAGNOSIS — F329 Major depressive disorder, single episode, unspecified: Secondary | ICD-10-CM | POA: Diagnosis not present

## 2013-03-25 DIAGNOSIS — G7000 Myasthenia gravis without (acute) exacerbation: Secondary | ICD-10-CM | POA: Diagnosis not present

## 2013-03-29 DIAGNOSIS — H3530 Unspecified macular degeneration: Secondary | ICD-10-CM | POA: Diagnosis not present

## 2013-03-29 DIAGNOSIS — I10 Essential (primary) hypertension: Secondary | ICD-10-CM | POA: Diagnosis not present

## 2013-03-29 DIAGNOSIS — F329 Major depressive disorder, single episode, unspecified: Secondary | ICD-10-CM | POA: Diagnosis not present

## 2013-03-29 DIAGNOSIS — IMO0001 Reserved for inherently not codable concepts without codable children: Secondary | ICD-10-CM | POA: Diagnosis not present

## 2013-03-29 DIAGNOSIS — Z4789 Encounter for other orthopedic aftercare: Secondary | ICD-10-CM | POA: Diagnosis not present

## 2013-03-31 ENCOUNTER — Other Ambulatory Visit: Payer: Self-pay

## 2013-03-31 MED ORDER — AMLODIPINE BESYLATE 5 MG PO TABS: 10.0000 mg | ORAL_TABLET | Freq: Every morning | ORAL | Status: DC

## 2013-04-01 ENCOUNTER — Other Ambulatory Visit: Payer: Self-pay | Admitting: *Deleted

## 2013-04-01 MED ORDER — LOSARTAN POTASSIUM-HCTZ 100-25 MG PO TABS: 1.0000 | ORAL_TABLET | Freq: Every day | ORAL | Status: DC

## 2013-04-06 ENCOUNTER — Ambulatory Visit: Payer: Medicare Other | Admitting: Cardiology

## 2013-04-07 ENCOUNTER — Ambulatory Visit (INDEPENDENT_AMBULATORY_CARE_PROVIDER_SITE_OTHER): Payer: Medicare Other | Admitting: Cardiology

## 2013-04-07 ENCOUNTER — Other Ambulatory Visit (INDEPENDENT_AMBULATORY_CARE_PROVIDER_SITE_OTHER): Payer: Medicare Other

## 2013-04-07 ENCOUNTER — Encounter: Payer: Self-pay | Admitting: Cardiology

## 2013-04-07 VITALS — BP 154/78 | HR 58 | Ht 64.0 in | Wt 155.0 lb

## 2013-04-07 DIAGNOSIS — R0989 Other specified symptoms and signs involving the circulatory and respiratory systems: Secondary | ICD-10-CM

## 2013-04-07 DIAGNOSIS — E7800 Pure hypercholesterolemia, unspecified: Secondary | ICD-10-CM | POA: Diagnosis not present

## 2013-04-07 DIAGNOSIS — R5383 Other fatigue: Secondary | ICD-10-CM | POA: Diagnosis not present

## 2013-04-07 DIAGNOSIS — I119 Hypertensive heart disease without heart failure: Secondary | ICD-10-CM | POA: Diagnosis not present

## 2013-04-07 DIAGNOSIS — R5381 Other malaise: Secondary | ICD-10-CM

## 2013-04-07 LAB — BASIC METABOLIC PANEL
CO2: 30 mEq/L (ref 19–32)
Calcium: 9.5 mg/dL (ref 8.4–10.5)
Chloride: 101 mEq/L (ref 96–112)
Creatinine, Ser: 0.8 mg/dL (ref 0.4–1.2)
Glucose, Bld: 84 mg/dL (ref 70–99)

## 2013-04-07 LAB — HEPATIC FUNCTION PANEL
ALT: 22 U/L (ref 0–35)
Albumin: 4.4 g/dL (ref 3.5–5.2)
Alkaline Phosphatase: 92 U/L (ref 39–117)
Bilirubin, Direct: 0 mg/dL (ref 0.0–0.3)
Total Protein: 6.8 g/dL (ref 6.0–8.3)

## 2013-04-07 LAB — LIPID PANEL
HDL: 69.2 mg/dL (ref >39.00)
Total CHOL/HDL Ratio: 3
Triglycerides: 118 mg/dL (ref 0.0–149.0)

## 2013-04-07 LAB — TSH: TSH: 3.55 u[IU]/mL (ref 0.35–5.50)

## 2013-04-07 NOTE — Assessment & Plan Note (Signed)
The patient has not been having any symptoms of congestive heart failure or angina pectoris.  Blood pressure has been mildly elevated.  No dizzy spells or syncope.

## 2013-04-07 NOTE — Assessment & Plan Note (Signed)
The patient is followed for her myasthenia gravis by Dr. Alphonzo Dublin at [facility].  The patient is presently on prednisone 10 mg daily.  She is also on ibuprofen or Aleve for her arthritis.  The 10 mg prednisone may be contributing to her slight blood pressure elevation today.  She may be able to cut down on her prednisone dose and she will discuss that with her neurologist.

## 2013-04-07 NOTE — Progress Notes (Signed)
Quick Note:  Please report to patient. The recent labs are stable. Continue same medication and careful diet. The potassium is low. Increase potassium to 20 mEq twice a day. ______

## 2013-04-07 NOTE — Progress Notes (Signed)
Tamara Gregory Date of Birth:  01-07-1925 [facility] [address] [place], [place]  [address] [phone]         Fax   [phone]  History of Present Illness:  This pleasant 79 year old woman is seen for a scheduled followup office visit. She has a past history of hypercholesterolemia and essential hypertension. She has a history of myasthenia gravis and is followed for that at [facility]. Since we last saw her in our office she has had successful back surgery by Dr. Darrelyn Hillock. She has a known for 0.4 cm fusiform ascending aortic aneurysm which is unchanged over the past year. When the patient was initially admitted for her back surgery she had some chest pain in the preoperative holding area and her back surgery had to be postponed while she underwent a cardiac workup. She had a Myoview stress test which showed no ischemia. Her back surgery was subsequently rescheduled and performed without incident. She is making a good recovery from her back surgery. She is having some residual pain down her left leg her neurologist has placed her on Lyrica. She was still having some mild residual left leg discomfort. She still feels weak and has had some mild nausea which is improved since she stopped taking Percocet. She was using ibuprofen for her mild discomfort. She has a past history of depression which has been worse postop. She has been having some mild constipation. She has never had a colonoscopy.   Current Outpatient Prescriptions  Medication Sig Dispense Refill  . amLODipine (NORVASC) 5 MG tablet Take 2 tablets (10 mg total) by mouth every morning.  180 tablet  1  . aspirin EC 81 MG tablet Take 81 mg by mouth 2 (two) times daily.       . beta carotene 57846 UNIT capsule Take 25,000 Units by mouth daily.       . bisacodyl (DULCOLAX) 5 MG EC tablet Take 5 mg by mouth daily as needed for constipation.      . Calcium Carbonate (CALCIUM 500 PO) Take 1 capsule by mouth  daily.       . cholecalciferol (VITAMIN D) 1000 UNITS tablet Take 1,000 Units by mouth daily.       . citalopram (CELEXA) 40 MG tablet Take 1 tablet (40 mg total) by mouth daily before breakfast.  90 tablet  3  . folic acid (FOLVITE) 400 MCG tablet Take 400 mcg by mouth daily.      . Glucosamine-Chondroit-Vit C-Mn (GLUCOSAMINE 1500 COMPLEX PO) Take by mouth daily.       [name] losartan-hydrochlorothiazide (HYZAAR) 100-25 MG per tablet Take 1 tablet by mouth daily before breakfast.  90 tablet  1  . LYSINE PO Take 1 tablet by mouth daily.       . methocarbamol (ROBAXIN) 500 MG tablet Take 1 tablet (500 mg total) by mouth every 6 (six) hours as needed.  40 tablet  1  . metoprolol tartrate (LOPRESSOR) 25 MG tablet 12.5 mg 2 (two) times daily. 1/2 tablet twice a day      . Multiple Vitamin (MULTIVITAMIN) tablet Take 1 tablet by mouth daily.       . Multiple Vitamins-Minerals (PRESERVISION AREDS) CAPS Take 2 capsules by mouth daily.      . Omega-3 Fatty Acids (FISH OIL) 435 MG CAPS Take 1 capsule by mouth daily.      [name] omeprazole (PRILOSEC) 20 MG capsule Take 20 mg by mouth daily.       [name]  ondansetron (ZOFRAN) 4 MG tablet Take 1 tablet (4 mg total) by mouth every 8 (eight) hours as needed for nausea.  15 tablet  0  . potassium chloride (K-DUR,KLOR-CON) 10 MEQ tablet Take 20 mEq by mouth daily.      . pravastatin (PRAVACHOL) 40 MG tablet Take 40 mg by mouth daily before breakfast.      . predniSONE (DELTASONE) 10 MG tablet Take 1 tablet by mouth daily before breakfast.      . Pregabalin (LYRICA PO) Take by mouth 2 (two) times daily.      [name] oxyCODONE-acetaminophen (PERCOCET) 10-325 MG per tablet Take 1 tablet by mouth every 4 (four) hours as needed for pain.  60 tablet  0  . Psyllium (METAMUCIL PO) Take by mouth. 15 cc daily       No current facility-administered medications for this visit.    No Known Allergies  Patient Active Problem List   Diagnosis Date Noted  . Depression (emotion) 02/15/2013  .  Spinal stenosis, lumbar region, with neurogenic claudication 02/04/2013  . Myasthenia gravis 03/17/2012  . Pure hypercholesterolemia 09/05/2011  . Benign hypertensive heart disease without heart failure 09/05/2011  . Osteoarthritis 09/05/2011    History  Smoking status  . Never Smoker   Smokeless tobacco  . Never Used    History  Alcohol Use No    Family History  Problem Relation Age of Onset  . Heart attack Father   . Cancer Sister   . Cancer Brother   . Cancer Sister   . Cancer Sister     Review of Systems: Constitutional: no fever chills diaphoresis or fatigue or change in weight.  Head and neck: no hearing loss, no epistaxis, no photophobia or visual disturbance. Respiratory: No cough, shortness of breath or wheezing. Cardiovascular: No chest pain peripheral edema, palpitations. Gastrointestinal: No abdominal distention, no abdominal pain, no change in bowel habits hematochezia or melena. Genitourinary: No dysuria, no frequency, no urgency, no nocturia. Musculoskeletal:No arthralgias, no back pain, no gait disturbance or myalgias. Neurological: No dizziness, no headaches, no numbness, no seizures, no syncope, no weakness, no tremors. Hematologic: No lymphadenopathy, no easy bruising. Psychiatric: No confusion, no hallucinations, no sleep disturbance.    Physical Exam: Filed Vitals:   04/07/13 0837  BP: 154/78  Pulse: 58   the general appearance reveals a well-developed well-nourished woman in no distress.The head and neck exam reveals pupils equal and reactive.  Extraocular movements are full.  There is no scleral icterus.  The mouth and pharynx are normal.  The neck is supple.  The carotids reveal no bruits.  The jugular venous pressure is normal.  The  thyroid is not enlarged.  There is no lymphadenopathy.  The chest is clear to percussion and auscultation.  There are no rales or rhonchi.  Expansion of the chest is symmetrical.  The precordium is quiet.  The first  heart sound is normal.  The second heart sound is physiologically split.  There is no murmur gallop rub or click.  There is no abnormal lift or heave.  The abdomen is soft and nontender.  The bowel sounds are normal.  The liver and spleen are not enlarged.  There are no abdominal masses.  There are no abdominal bruits.  Extremities reveal good pedal pulses.  There is no phlebitis or edema.  There is no cyanosis or clubbing.  Strength is normal and symmetrical in all extremities.  There is no lateralizing weakness.  There are no sensory deficits.  The skin is warm and dry.  There is no rash.    Assessment / Plan: Continue same medication.  Await results of today's labs.  She will discuss with her neurologist possible tapering of her prednisone to a lower dose because of her blood pressure.  Recheck here in 6 months for followup office visit fasting lipid panel hepatic function panel and basal metabolic panel.

## 2013-04-07 NOTE — Patient Instructions (Addendum)
Will obtain labs today and call you with the results (lp/bmet/hfp/tsh)  Your physician recommends that you continue on your current medications as directed. Please refer to the Current Medication list given to you today.  Your physician wants you to follow-up in: 6 months with fasting labs (lp/bmet/hfp)  You will receive a reminder letter in the mail two months in advance. If you don't receive a letter, please call our office to schedule the follow-up appointment.

## 2013-04-07 NOTE — Assessment & Plan Note (Signed)
Patient has a history of hypercholesterolemia.  She is on pravastatin.  She is not having any myalgias.  We are checking fasting lab work today.

## 2013-04-08 ENCOUNTER — Other Ambulatory Visit: Payer: Self-pay | Admitting: *Deleted

## 2013-04-08 MED ORDER — POTASSIUM CHLORIDE CRYS ER 20 MEQ PO TBCR: 20.0000 meq | EXTENDED_RELEASE_TABLET | Freq: Two times a day (BID) | ORAL | Status: DC

## 2013-04-09 ENCOUNTER — Other Ambulatory Visit: Payer: Self-pay | Admitting: *Deleted

## 2013-04-22 ENCOUNTER — Other Ambulatory Visit: Payer: Self-pay | Admitting: *Deleted

## 2013-04-22 MED ORDER — PREDNISONE 10 MG PO TABS: 10.0000 mg | ORAL_TABLET | Freq: Every day | ORAL | Status: DC

## 2013-04-22 MED ORDER — LOSARTAN POTASSIUM-HCTZ 100-25 MG PO TABS: 1.0000 | ORAL_TABLET | Freq: Every day | ORAL | Status: DC

## 2013-04-22 MED ORDER — CITALOPRAM HYDROBROMIDE 40 MG PO TABS: 40.0000 mg | ORAL_TABLET | Freq: Every day | ORAL | Status: DC

## 2013-05-12 DIAGNOSIS — L821 Other seborrheic keratosis: Secondary | ICD-10-CM | POA: Diagnosis not present

## 2013-05-12 DIAGNOSIS — L905 Scar conditions and fibrosis of skin: Secondary | ICD-10-CM | POA: Diagnosis not present

## 2013-05-12 DIAGNOSIS — D0470 Carcinoma in situ of skin of unspecified lower limb, including hip: Secondary | ICD-10-CM | POA: Diagnosis not present

## 2013-05-12 DIAGNOSIS — D485 Neoplasm of uncertain behavior of skin: Secondary | ICD-10-CM | POA: Diagnosis not present

## 2013-05-12 DIAGNOSIS — L570 Actinic keratosis: Secondary | ICD-10-CM | POA: Diagnosis not present

## 2013-05-12 DIAGNOSIS — L820 Inflamed seborrheic keratosis: Secondary | ICD-10-CM | POA: Diagnosis not present

## 2013-06-07 ENCOUNTER — Telehealth: Payer: Self-pay | Admitting: Cardiology

## 2013-06-07 NOTE — Telephone Encounter (Signed)
I spoke with the pt's husband and he states the pt had back surgery in the past and she is complaining of a lot of back pain.  The pt does have more pain when she first gets up in the morning.  The pt's husband is "searching" for something to help the pt's pain.  He was wondering if the pt should see Dr Phylliss Bob (this MD did not do surgery).  He would like to get Dr Yevonne Pax thoughts about having the pt see Dr Erma Pinto.  I will forward this message to Dr Patty Sermons and Juliette Alcide.

## 2013-06-07 NOTE — Telephone Encounter (Signed)
New Problem  Husband wants to speak with a nurse regarding his wife. He would not disclose any information to me.

## 2013-06-07 NOTE — Telephone Encounter (Signed)
Since Dr. [facility] did the surgery the patient should see them first.

## 2013-06-07 NOTE — Telephone Encounter (Signed)
Spoke with patient and she has seen Dr [facility]ann[facility] and they are telling her she is fine. Patient states that she has pain that is so bad causes her to be nauseated and have headaches, does radiate down leg. Will forward to  Dr. Patty Sermons for recommendations

## 2013-06-07 NOTE — Telephone Encounter (Signed)
In that case let us refer her to Dr. Danielle Dess.

## 2013-06-10 NOTE — Telephone Encounter (Signed)
Waiting to hear from Dr Verlee Rossetti office, info faxed

## 2013-06-11 NOTE — Telephone Encounter (Signed)
I spoke with the pt's husband and made him aware that Juliette Alcide has faxed the pt's records to Dr Verlee Rossetti office for review and is awaiting an appointment.

## 2013-06-11 NOTE — Telephone Encounter (Deleted)
.  fd

## 2013-06-11 NOTE — Telephone Encounter (Signed)
Follow up  Pt husband wants to speak with someone regarding the referral to another provider. He said he has a few questions

## 2013-06-15 NOTE — Telephone Encounter (Signed)
Called Dr Verlee Rossetti office and ov still pending approval from MD. Left message at patients home number

## 2013-06-24 NOTE — Telephone Encounter (Signed)
Received fax appointment 08/12/13 at 2:00, patient notified per fax

## 2013-07-08 ENCOUNTER — Other Ambulatory Visit: Payer: Self-pay

## 2013-07-08 MED ORDER — PRAVASTATIN SODIUM 40 MG PO TABS: 40.0000 mg | ORAL_TABLET | Freq: Every day | ORAL | Status: DC

## 2013-07-08 MED ORDER — METOPROLOL TARTRATE 25 MG PO TABS: 12.5000 mg | ORAL_TABLET | Freq: Two times a day (BID) | ORAL | Status: DC

## 2013-08-12 DIAGNOSIS — M48061 Spinal stenosis, lumbar region without neurogenic claudication: Secondary | ICD-10-CM | POA: Diagnosis not present

## 2013-08-13 ENCOUNTER — Other Ambulatory Visit: Payer: Self-pay | Admitting: Neurological Surgery

## 2013-08-13 DIAGNOSIS — M48061 Spinal stenosis, lumbar region without neurogenic claudication: Secondary | ICD-10-CM

## 2013-08-20 ENCOUNTER — Ambulatory Visit
Admission: RE | Admit: 2013-08-20 | Discharge: 2013-08-20 | Disposition: A | Discharge status: Home / Self Care | Payer: Medicare Other | Source: Ambulatory Visit | Attending: Neurological Surgery | Admitting: Neurological Surgery

## 2013-08-20 DIAGNOSIS — M48061 Spinal stenosis, lumbar region without neurogenic claudication: Secondary | ICD-10-CM | POA: Diagnosis not present

## 2013-08-20 MED ORDER — GADOBENATE DIMEGLUMINE 529 MG/ML IV SOLN
14.0000 mL | Freq: Once | INTRAVENOUS | Status: AC | PRN
  Administered 2013-08-20: 14 mL via INTRAVENOUS

## 2013-08-25 DIAGNOSIS — M48061 Spinal stenosis, lumbar region without neurogenic claudication: Secondary | ICD-10-CM | POA: Diagnosis not present

## 2013-08-31 DIAGNOSIS — M545 Low back pain, unspecified: Secondary | ICD-10-CM | POA: Diagnosis not present

## 2013-08-31 DIAGNOSIS — M48061 Spinal stenosis, lumbar region without neurogenic claudication: Secondary | ICD-10-CM | POA: Diagnosis not present

## 2013-08-31 DIAGNOSIS — IMO0002 Reserved for concepts with insufficient information to code with codable children: Secondary | ICD-10-CM | POA: Diagnosis not present

## 2013-09-22 DIAGNOSIS — IMO0002 Reserved for concepts with insufficient information to code with codable children: Secondary | ICD-10-CM | POA: Diagnosis not present

## 2013-10-04 ENCOUNTER — Ambulatory Visit (INDEPENDENT_AMBULATORY_CARE_PROVIDER_SITE_OTHER)
Admission: RE | Admit: 2013-10-04 | Discharge: 2013-10-04 | Disposition: A | Discharge status: Home / Self Care | Payer: Medicare Other | Source: Ambulatory Visit | Attending: Cardiology | Admitting: Cardiology

## 2013-10-04 ENCOUNTER — Encounter: Payer: Self-pay | Admitting: Cardiology

## 2013-10-04 ENCOUNTER — Ambulatory Visit (INDEPENDENT_AMBULATORY_CARE_PROVIDER_SITE_OTHER): Payer: Medicare Other | Admitting: Cardiology

## 2013-10-04 VITALS — BP 132/78 | HR 98 | Ht 64.0 in | Wt 164.8 lb

## 2013-10-04 DIAGNOSIS — I491 Atrial premature depolarization: Secondary | ICD-10-CM

## 2013-10-04 DIAGNOSIS — I119 Hypertensive heart disease without heart failure: Secondary | ICD-10-CM

## 2013-10-04 DIAGNOSIS — R0602 Shortness of breath: Secondary | ICD-10-CM | POA: Diagnosis not present

## 2013-10-04 DIAGNOSIS — E876 Hypokalemia: Secondary | ICD-10-CM | POA: Diagnosis not present

## 2013-10-04 DIAGNOSIS — F329 Major depressive disorder, single episode, unspecified: Secondary | ICD-10-CM

## 2013-10-04 DIAGNOSIS — R5381 Other malaise: Secondary | ICD-10-CM | POA: Insufficient documentation

## 2013-10-04 DIAGNOSIS — E7800 Pure hypercholesterolemia, unspecified: Secondary | ICD-10-CM

## 2013-10-04 LAB — BASIC METABOLIC PANEL
BUN: 24 mg/dL — ABNORMAL HIGH (ref 6–23)
Creatinine, Ser: 0.8 mg/dL (ref 0.4–1.2)
GFR: 73.22 mL/min (ref >60.00)
Sodium: 140 mEq/L (ref 135–145)

## 2013-10-04 LAB — CBC WITH DIFFERENTIAL/PLATELET
Basophils Absolute: 0 10*3/uL (ref 0.0–0.1)
Basophils Relative: 0.3 % (ref 0.0–3.0)
Eosinophils Absolute: 0.1 10*3/uL (ref 0.0–0.7)
HCT: 38.3 % (ref 36.0–46.0)
Hemoglobin: 13.1 g/dL (ref 12.0–15.0)
Lymphocytes Relative: 24.1 % (ref 12.0–46.0)
Lymphs Abs: 1.9 10*3/uL (ref 0.7–4.0)
MCHC: 34.3 g/dL (ref 30.0–36.0)
Monocytes Relative: 10.2 % (ref 3.0–12.0)
Neutro Abs: 4.9 10*3/uL (ref 1.4–7.7)
Neutrophils Relative %: 63.5 % (ref 43.0–77.0)
RBC: 4 Mil/uL (ref 3.87–5.11)
RDW: 14.3 % (ref 11.5–14.6)

## 2013-10-04 LAB — LIPID PANEL
Total CHOL/HDL Ratio: 3
Triglycerides: 164 mg/dL — ABNORMAL HIGH (ref 0.0–149.0)

## 2013-10-04 LAB — HEPATIC FUNCTION PANEL
ALT: 24 U/L (ref 0–35)
Alkaline Phosphatase: 53 U/L (ref 39–117)
Bilirubin, Direct: 0 mg/dL (ref 0.0–0.3)
Total Protein: 6.9 g/dL (ref 6.0–8.3)

## 2013-10-04 NOTE — Assessment & Plan Note (Signed)
The patient continues to have problems with depression.  She remains on citalopram.

## 2013-10-04 NOTE — Patient Instructions (Signed)
Will obtain labs today and call you with the results (lp/bmet/hfp/cbc/tsh)  Your physician recommends that you continue on your current medications as directed. Please refer to the Current Medication list given to you today.  Your physician wants you to follow-up in: 6 months with fasting labs (lp/bmet/hfp)  You will receive a reminder letter in the mail two months in advance. If you don't receive a letter, please call our office to schedule the follow-up appointment.   Would like for yo to go soon to the [facility] across from [facility] soon for a chest xray

## 2013-10-04 NOTE — Progress Notes (Signed)
Tamara Gregory Date of Birth:  [birth year] [address] [place], [place]  [address] [phone]         Fax   [phone]  History of Present Illness:  This pleasant 79 year old woman is seen for a scheduled followup office visit. She has a past history of hypercholesterolemia and essential hypertension. She has a history of myasthenia gravis and is followed for that at [facility]. Since we last saw her in our office she has had successful back surgery by Dr. Darrelyn Hillock. She has a known for 0.4 cm fusiform ascending aortic aneurysm which is unchanged over the past year. When the patient was initially admitted for her back surgery she had some chest pain in the preoperative holding area and her back surgery had to be postponed while she underwent a cardiac workup. She had a Myoview stress test which showed no ischemia. Her back surgery was subsequently rescheduled and performed without incident. She is making a good recovery from her back surgery. She is having some residual pain down her left leg her neurologist has placed her on Lyrica. She was still having some mild residual left leg discomfort. She still feels weak and has had some mild nausea which is improved since she stopped taking Percocet. She was using ibuprofen for her mild discomfort. She has a past history of depression which has been worse postop. She has been having some mild constipation. She has never had a colonoscopy.  She has been experiencing some exertional dyspnea.  She has not been getting any regular exercise and her weight is up 9 pounds.   Current Outpatient Prescriptions  Medication Sig Dispense Refill  . amLODipine (NORVASC) 5 MG tablet Take 2 tablets (10 mg total) by mouth every morning.  180 tablet  1  . aspirin EC 81 MG tablet Take 81 mg by mouth 2 (two) times daily.       . beta carotene 64403 UNIT capsule Take 25,000 Units by mouth daily.       . bisacodyl (DULCOLAX) 5 MG EC tablet Take 5 mg by  mouth daily as needed for constipation.      . Calcium Carbonate (CALCIUM 500 PO) Take 1 capsule by mouth daily.       . cholecalciferol (VITAMIN D) 1000 UNITS tablet Take 1,000 Units by mouth daily.       . citalopram (CELEXA) 40 MG tablet Take 1 tablet (40 mg total) by mouth daily before breakfast.  90 tablet  3  . folic acid (FOLVITE) 400 MCG tablet Take 400 mcg by mouth daily.      [name] gabapentin (NEURONTIN) 100 MG capsule       . Glucosamine-Chondroit-Vit C-Mn (GLUCOSAMINE 1500 COMPLEX PO) Take by mouth daily.       [name] losartan-hydrochlorothiazide (HYZAAR) 100-25 MG per tablet Take 1 tablet by mouth daily before breakfast.  90 tablet  3  . LYSINE PO Take 1 tablet by mouth daily.       . meloxicam (MOBIC) 15 MG tablet       . methocarbamol (ROBAXIN) 500 MG tablet Take 1 tablet (500 mg total) by mouth every 6 (six) hours as needed.  40 tablet  1  . metoprolol tartrate (LOPRESSOR) 25 MG tablet Take 0.5 tablets (12.5 mg total) by mouth 2 (two) times daily. 1/2 tablet twice a day  30 tablet  6  . Multiple Vitamin (MULTIVITAMIN) tablet Take 1 tablet by mouth daily.       [name]  Multiple Vitamins-Minerals (PRESERVISION AREDS) CAPS Take 2 capsules by mouth daily.      . Omega-3 Fatty Acids (FISH OIL) 435 MG CAPS Take 1 capsule by mouth daily.      [name] omeprazole (PRILOSEC) 20 MG capsule Take 20 mg by mouth daily.       . ondansetron (ZOFRAN) 4 MG tablet Take 1 tablet (4 mg total) by mouth every 8 (eight) hours as needed for nausea.  15 tablet  0  . oxyCODONE-acetaminophen (PERCOCET) 10-325 MG per tablet Take 1 tablet by mouth every 4 (four) hours as needed for pain.  60 tablet  0  . potassium chloride SA (KLOR-CON M20) 20 MEQ tablet Take 1 tablet (20 mEq total) by mouth 2 (two) times daily.  90 tablet  3  . pravastatin (PRAVACHOL) 40 MG tablet Take 1 tablet (40 mg total) by mouth daily before breakfast.  30 tablet  6  . predniSONE (DELTASONE) 10 MG tablet Take 1 tablet (10 mg total) by mouth daily before  breakfast.  90 tablet  3  . Pregabalin (LYRICA PO) Take by mouth 2 (two) times daily.      . Psyllium (METAMUCIL PO) Take by mouth. 15 cc daily       No current facility-administered medications for this visit.    No Known Allergies  Patient Active Problem List   Diagnosis Date Noted  . Malaise and fatigue 10/04/2013  . PAC (premature atrial contraction) 10/04/2013  . Depression (emotion) 02/15/2013  . Spinal stenosis, lumbar region, with neurogenic claudication 02/04/2013  . Myasthenia gravis 03/17/2012  . Pure hypercholesterolemia 09/05/2011  . Benign hypertensive heart disease without heart failure 09/05/2011  . Osteoarthritis 09/05/2011    History  Smoking status  . Never Smoker   Smokeless tobacco  . Never Used    History  Alcohol Use No    Family History  Problem Relation Age of Onset  . Heart attack Father   . Cancer Sister   . Cancer Brother   . Cancer Sister   . Cancer Sister     Review of Systems: Constitutional: no fever chills diaphoresis or fatigue or change in weight.  Head and neck: no hearing loss, no epistaxis, no photophobia or visual disturbance. Respiratory: No cough, shortness of breath or wheezing. Cardiovascular: No chest pain peripheral edema, palpitations. Gastrointestinal: No abdominal distention, no abdominal pain, no change in bowel habits hematochezia or melena. Genitourinary: No dysuria, no frequency, no urgency, no nocturia. Musculoskeletal:No arthralgias, no back pain, no gait disturbance or myalgias. Neurological: No dizziness, no headaches, no numbness, no seizures, no syncope, no weakness, no tremors. Hematologic: No lymphadenopathy, no easy bruising. Psychiatric: No confusion, no hallucinations, no sleep disturbance.    Physical Exam: Filed Vitals:   10/04/13 0853  BP: 132/78  Pulse: 98   the general appearance reveals a well-developed well-nourished woman in no distress.The head and neck exam reveals pupils equal and  reactive.  Extraocular movements are full.  There is no scleral icterus.  The mouth and pharynx are normal.  The neck is supple.  The carotids reveal no bruits.  The jugular venous pressure is normal.  The  thyroid is not enlarged.  There is no lymphadenopathy.  The chest is clear to percussion and auscultation.  There are no rales or rhonchi.  Expansion of the chest is symmetrical.  The precordium is quiet.  The first heart sound is normal.  The second heart sound is physiologically split.  There is no murmur gallop rub  or click.  There is no abnormal lift or heave.  The abdomen is soft and nontender.  The bowel sounds are normal.  The liver and spleen are not enlarged.  There are no abdominal masses.  There are no abdominal bruits.  Extremities reveal good pedal pulses.  There is no phlebitis or edema.  There is no cyanosis or clubbing.  Strength is normal and symmetrical in all extremities.  There is no lateralizing weakness.  There are no sensory deficits.  The skin is warm and dry.  There is no rash.  EKG shows normal sinus rhythm with occasional premature atrial beats.  No ischemic changes.  Assessment / Plan: Continue same medication.  We are checking lab work today.  Because of her complaints of exertional dyspnea we will also get a chest x-ray to look at heart size and lungs. Recheck in 6 months for followup office visit basal metabolic panel lipid panel hepatic function panel

## 2013-10-04 NOTE — Assessment & Plan Note (Signed)
The patient has not been having any symptoms of palpitations.  No dizziness or syncope.

## 2013-10-04 NOTE — Assessment & Plan Note (Signed)
Patient has a history of hypercholesterolemia.  She is on pravastatin and fish oil capsules.  She is not having any myalgias from the pravastatin.  Blood work today is pending

## 2013-10-04 NOTE — Assessment & Plan Note (Signed)
The patient complains of lack of energy.  We will also check a CBC and thyroid function studies today to be sure that she is not having a reversible cause of her malaise and fatigue that could be corrected.  She has not been having awareness of external blood loss or anemia

## 2013-10-05 ENCOUNTER — Telehealth: Payer: Self-pay | Admitting: *Deleted

## 2013-10-05 NOTE — Telephone Encounter (Signed)
Message copied by Burnell Blanks on Tue Oct 05, 2013 12:15 PM ------      Message from: Cassell Clement      Created: Tue Oct 05, 2013  8:59 AM       The chest x-ray is stable.  The large hiatal hernia is again seen.  No sign of congestive heart failure ------

## 2013-10-05 NOTE — Progress Notes (Signed)
Quick Note:  Please report to patient. The recent labs are stable. Continue same medication and careful diet. There is no anemia. The thyroid levels are normal. The cholesterol and triglycerides are higher because of the weight gain. Work harder on diet ______

## 2013-10-05 NOTE — Telephone Encounter (Signed)
Advised husband of labs and chest xray

## 2013-10-05 NOTE — Telephone Encounter (Signed)
Message copied by Burnell Blanks on Tue Oct 05, 2013 10:43 AM ------      Message from: Cassell Clement      Created: Tue Oct 05, 2013  8:58 AM       Please report to patient.  The recent labs are stable. Continue same medication and careful diet.  There is no anemia.  The thyroid levels are normal.  The cholesterol and triglycerides are higher because of the weight gain.  Work harder on diet ------

## 2013-10-27 ENCOUNTER — Other Ambulatory Visit: Payer: Self-pay | Admitting: Cardiology

## 2013-10-29 ENCOUNTER — Other Ambulatory Visit: Payer: Self-pay

## 2013-10-29 MED ORDER — POTASSIUM CHLORIDE CRYS ER 20 MEQ PO TBCR: 20.0000 meq | EXTENDED_RELEASE_TABLET | Freq: Two times a day (BID) | ORAL | Status: DC

## 2013-11-11 DIAGNOSIS — L821 Other seborrheic keratosis: Secondary | ICD-10-CM | POA: Diagnosis not present

## 2013-11-11 DIAGNOSIS — IMO0002 Reserved for concepts with insufficient information to code with codable children: Secondary | ICD-10-CM | POA: Diagnosis not present

## 2013-11-11 DIAGNOSIS — L570 Actinic keratosis: Secondary | ICD-10-CM | POA: Diagnosis not present

## 2013-11-19 ENCOUNTER — Other Ambulatory Visit: Payer: Self-pay | Admitting: *Deleted

## 2013-11-19 MED ORDER — AMLODIPINE BESYLATE 5 MG PO TABS: 10.0000 mg | ORAL_TABLET | Freq: Every morning | ORAL | Status: DC

## 2013-12-28 ENCOUNTER — Other Ambulatory Visit: Payer: Self-pay | Admitting: Cardiology

## 2014-01-10 DIAGNOSIS — S60219A Contusion of unspecified wrist, initial encounter: Secondary | ICD-10-CM | POA: Diagnosis not present

## 2014-01-10 DIAGNOSIS — M25539 Pain in unspecified wrist: Secondary | ICD-10-CM | POA: Diagnosis not present

## 2014-01-17 DIAGNOSIS — N3941 Urge incontinence: Secondary | ICD-10-CM | POA: Insufficient documentation

## 2014-01-17 DIAGNOSIS — R3915 Urgency of urination: Secondary | ICD-10-CM | POA: Diagnosis not present

## 2014-01-19 DIAGNOSIS — Z79899 Other long term (current) drug therapy: Secondary | ICD-10-CM | POA: Diagnosis not present

## 2014-01-19 DIAGNOSIS — M48061 Spinal stenosis, lumbar region without neurogenic claudication: Secondary | ICD-10-CM | POA: Diagnosis not present

## 2014-01-19 DIAGNOSIS — M961 Postlaminectomy syndrome, not elsewhere classified: Secondary | ICD-10-CM | POA: Diagnosis not present

## 2014-01-19 DIAGNOSIS — Z5181 Encounter for therapeutic drug level monitoring: Secondary | ICD-10-CM | POA: Diagnosis not present

## 2014-01-20 DIAGNOSIS — M961 Postlaminectomy syndrome, not elsewhere classified: Secondary | ICD-10-CM | POA: Diagnosis not present

## 2014-01-20 DIAGNOSIS — G894 Chronic pain syndrome: Secondary | ICD-10-CM | POA: Diagnosis not present

## 2014-01-29 DIAGNOSIS — R52 Pain, unspecified: Secondary | ICD-10-CM | POA: Diagnosis not present

## 2014-01-29 DIAGNOSIS — E7800 Pure hypercholesterolemia, unspecified: Secondary | ICD-10-CM | POA: Diagnosis not present

## 2014-01-29 DIAGNOSIS — G7000 Myasthenia gravis without (acute) exacerbation: Secondary | ICD-10-CM | POA: Diagnosis not present

## 2014-01-29 DIAGNOSIS — R5381 Other malaise: Secondary | ICD-10-CM | POA: Diagnosis not present

## 2014-01-29 DIAGNOSIS — M4120 Other idiopathic scoliosis, site unspecified: Secondary | ICD-10-CM | POA: Diagnosis not present

## 2014-01-29 DIAGNOSIS — IMO0002 Reserved for concepts with insufficient information to code with codable children: Secondary | ICD-10-CM | POA: Diagnosis not present

## 2014-01-29 DIAGNOSIS — R079 Chest pain, unspecified: Secondary | ICD-10-CM | POA: Diagnosis not present

## 2014-01-29 DIAGNOSIS — R55 Syncope and collapse: Secondary | ICD-10-CM | POA: Diagnosis not present

## 2014-01-29 DIAGNOSIS — Z8249 Family history of ischemic heart disease and other diseases of the circulatory system: Secondary | ICD-10-CM | POA: Diagnosis not present

## 2014-01-29 DIAGNOSIS — I10 Essential (primary) hypertension: Secondary | ICD-10-CM | POA: Diagnosis not present

## 2014-01-29 DIAGNOSIS — I498 Other specified cardiac arrhythmias: Secondary | ICD-10-CM | POA: Diagnosis not present

## 2014-01-29 DIAGNOSIS — R5383 Other fatigue: Secondary | ICD-10-CM | POA: Diagnosis not present

## 2014-01-30 DIAGNOSIS — I319 Disease of pericardium, unspecified: Secondary | ICD-10-CM | POA: Diagnosis not present

## 2014-01-30 DIAGNOSIS — R079 Chest pain, unspecified: Secondary | ICD-10-CM | POA: Diagnosis not present

## 2014-01-30 DIAGNOSIS — G7000 Myasthenia gravis without (acute) exacerbation: Secondary | ICD-10-CM | POA: Diagnosis not present

## 2014-01-30 DIAGNOSIS — I359 Nonrheumatic aortic valve disorder, unspecified: Secondary | ICD-10-CM | POA: Diagnosis not present

## 2014-01-30 DIAGNOSIS — I369 Nonrheumatic tricuspid valve disorder, unspecified: Secondary | ICD-10-CM | POA: Diagnosis not present

## 2014-01-30 DIAGNOSIS — I059 Rheumatic mitral valve disease, unspecified: Secondary | ICD-10-CM | POA: Diagnosis not present

## 2014-01-30 DIAGNOSIS — I498 Other specified cardiac arrhythmias: Secondary | ICD-10-CM | POA: Diagnosis not present

## 2014-01-31 ENCOUNTER — Telehealth: Payer: Self-pay | Admitting: Cardiology

## 2014-01-31 NOTE — Telephone Encounter (Signed)
New message    Recently out of town in the hospital  - husband calling to discuss.

## 2014-01-31 NOTE — Telephone Encounter (Signed)
Patient in the hospital recently for chest pains and needs follow up ov. Scheduled with  Dr. Mare Ferrari on 02/04/14 and advised husband if any changes before then to call back

## 2014-02-03 DIAGNOSIS — N3941 Urge incontinence: Secondary | ICD-10-CM | POA: Diagnosis not present

## 2014-02-04 ENCOUNTER — Ambulatory Visit (INDEPENDENT_AMBULATORY_CARE_PROVIDER_SITE_OTHER): Payer: Medicare Other | Admitting: Cardiology

## 2014-02-04 ENCOUNTER — Encounter: Payer: Self-pay | Admitting: Cardiology

## 2014-02-04 VITALS — BP 139/88 | HR 73 | Ht 64.0 in | Wt 159.0 lb

## 2014-02-04 DIAGNOSIS — R5383 Other fatigue: Secondary | ICD-10-CM | POA: Diagnosis not present

## 2014-02-04 DIAGNOSIS — R5381 Other malaise: Secondary | ICD-10-CM | POA: Diagnosis not present

## 2014-02-04 DIAGNOSIS — M48062 Spinal stenosis, lumbar region with neurogenic claudication: Secondary | ICD-10-CM

## 2014-02-04 DIAGNOSIS — I119 Hypertensive heart disease without heart failure: Secondary | ICD-10-CM | POA: Diagnosis not present

## 2014-02-04 NOTE — Assessment & Plan Note (Signed)
The patient continues to have a lot of low back discomfort for which she takes the gabapentin.

## 2014-02-04 NOTE — Assessment & Plan Note (Signed)
The patient has had no further episodes of chest pressure.  She is not having any symptoms of CHF.  Her dobutamine echocardiogram on 01/30/14 was normal.

## 2014-02-04 NOTE — Patient Instructions (Addendum)
Your physician recommends that you continue on your current medications as directed. Please refer to the Current Medication list given to you today.  Your physician recommends that you schedule a follow-up appointment in: May ov/lp/bmet/hfp

## 2014-02-04 NOTE — Progress Notes (Signed)
Tamara Gregory Date of Birth:  Apr 07, 1925 [address] [place], [place]  [address] [phone]         Fax   [phone]  History of Present Illness:  This pleasant 80 year old woman is seen for a scheduled followup office visit. She has a past history of hypercholesterolemia and essential hypertension. She has a history of myasthenia gravis and is followed for that at [facility]. Since we last saw her in our office she has had successful back surgery by Dr. Gladstone Lighter. She has a known for 0.4 cm fusiform ascending aortic aneurysm which is unchanged over the past year. When the patient was initially admitted for her back surgery she had some chest pain in the preoperative holding area and her back surgery had to be postponed while she underwent a cardiac workup. She had a Myoview stress test which showed no ischemia. Her back surgery was subsequently rescheduled and performed without incident. She is making a good recovery from her back surgery. She is having some residual pain down her left leg her neurologist has placed her on gabapentin.  She complains of lethargy and fatigue which may be secondary to her gabapentin which was recently increased to 1200 mg daily.  The patient remains on low-dose prednisone 5 mg daily for her myasthenia gravis. The patient was recently hospitalized overnight admissions hospital in [facility] for chest pain.  She had a dobutamine stress echo the following morning which showed no evidence of ischemia and she was released.  She was admitted with bradycardia and her metoprolol was stopped and has not been restarted.   Current Outpatient Prescriptions  Medication Sig Dispense Refill  . amLODipine (NORVASC) 5 MG tablet Take 2 tablets (10 mg total) by mouth every morning.  180 tablet  1  . aspirin EC 81 MG tablet Take 81 mg by mouth 2 (two) times daily.       . Calcium Carbonate (CALCIUM 500 PO) Take 1 capsule by mouth daily.         . cholecalciferol (VITAMIN D) 1000 UNITS tablet Take 1,000 Units by mouth daily.       . citalopram (CELEXA) 40 MG tablet Take 1 tablet (40 mg total) by mouth daily before breakfast.  90 tablet  3  . folic acid (FOLVITE) 053 MCG tablet Take 400 mcg by mouth daily.      [name] gabapentin (NEURONTIN) 100 MG capsule 1200 mg daily      . Glucosamine-Chondroit-Vit C-Mn (GLUCOSAMINE 1500 COMPLEX PO) Take by mouth daily.       [name] losartan-hydrochlorothiazide (HYZAAR) 100-25 MG per tablet Take 1 tablet by mouth daily before breakfast.  90 tablet  3  . meloxicam (MOBIC) 15 MG tablet       . Multiple Vitamin (MULTIVITAMIN) tablet Take 1 tablet by mouth daily.       . Multiple Vitamins-Minerals (PRESERVISION AREDS) CAPS Take 2 capsules by mouth daily.      . Omega-3 Fatty Acids (FISH OIL) 435 MG CAPS Take 1 capsule by mouth daily.      [name] omeprazole (PRILOSEC) 20 MG capsule Take 20 mg by mouth daily.       . ondansetron (ZOFRAN) 4 MG tablet Take 1 tablet (4 mg total) by mouth every 8 (eight) hours as needed for nausea.  15 tablet  0  . potassium chloride SA (KLOR-CON M20) 20 MEQ tablet Take 1 tablet (20 mEq total) by mouth 2 (two) times daily.  [place]  tablet  3  . pravastatin (PRAVACHOL) 40 MG tablet TAKE 1 TABLET EVERY DAY  BEFORE  BREAKFAST  90 tablet  1  . predniSONE (DELTASONE) 5 MG tablet Take 5 mg by mouth daily with breakfast.       No current facility-administered medications for this visit.    No Known Allergies  Patient Active Problem List   Diagnosis Date Noted  . Malaise and fatigue 10/04/2013  . PAC (premature atrial contraction) 10/04/2013  . Depression (emotion) 02/15/2013  . Spinal stenosis, lumbar region, with neurogenic claudication 02/04/2013  . Myasthenia gravis 03/17/2012  . Pure hypercholesterolemia 09/05/2011  . Benign hypertensive heart disease without heart failure 09/05/2011  . Osteoarthritis 09/05/2011    History  Smoking status  . Never Smoker   Smokeless tobacco  .  Never Used    History  Alcohol Use No    Family History  Problem Relation Age of Onset  . Heart attack Father   . Cancer Sister   . Cancer Brother   . Cancer Sister   . Cancer Sister     Review of Systems: Constitutional: no fever chills diaphoresis or fatigue or change in weight.  Head and neck: no hearing loss, no epistaxis, no photophobia or visual disturbance. Respiratory: No cough, shortness of breath or wheezing. Cardiovascular: No chest pain peripheral edema, palpitations. Gastrointestinal: No abdominal distention, no abdominal pain, no change in bowel habits hematochezia or melena. Genitourinary: No dysuria, no frequency, no urgency, no nocturia. Musculoskeletal:No arthralgias, no back pain, no gait disturbance or myalgias. Neurological: No dizziness, no headaches, no numbness, no seizures, no syncope, no weakness, no tremors. Hematologic: No lymphadenopathy, no easy bruising. Psychiatric: No confusion, no hallucinations, no sleep disturbance.    Physical Exam: Filed Vitals:   02/04/14 1037  BP: 139/88  Pulse: 73   the general appearance reveals a well-developed well-nourished woman in no distress.The head and neck exam reveals pupils equal and reactive.  Extraocular movements are full.  There is no scleral icterus.  The mouth and pharynx are normal.  The neck is supple.  The carotids reveal no bruits.  The jugular venous pressure is normal.  The  thyroid is not enlarged.  There is no lymphadenopathy.  The chest is clear to percussion and auscultation.  There are no rales or rhonchi.  Expansion of the chest is symmetrical.  The precordium is quiet.  The first heart sound is normal.  The second heart sound is physiologically split.  There is no murmur gallop rub or click.  There is no abnormal lift or heave.  The abdomen is soft and nontender.  The bowel sounds are normal.  The liver and spleen are not enlarged.  There are no abdominal masses.  There are no abdominal bruits.   Extremities reveal good pedal pulses.  There is no phlebitis or edema.  There is no cyanosis or clubbing.  Strength is normal and symmetrical in all extremities.  There is no lateralizing weakness.  There are no sensory deficits.  The skin is warm and dry.  There is no rash.   Assessment / Plan: Continue on same medication.  Recheck in May for followup office visit EKG lipid panel metabolic panel and hepatic function panel.

## 2014-02-04 NOTE — Assessment & Plan Note (Signed)
Patient continues to complain of poor energy.  She will try omitting her noontime dose of gabapentin and see if this makes a difference.  Continue to be active as tolerated

## 2014-02-22 DIAGNOSIS — M48061 Spinal stenosis, lumbar region without neurogenic claudication: Secondary | ICD-10-CM | POA: Diagnosis not present

## 2014-02-22 DIAGNOSIS — M961 Postlaminectomy syndrome, not elsewhere classified: Secondary | ICD-10-CM | POA: Diagnosis not present

## 2014-03-03 ENCOUNTER — Other Ambulatory Visit: Payer: Self-pay | Admitting: *Deleted

## 2014-03-03 MED ORDER — LOSARTAN POTASSIUM-HCTZ 100-25 MG PO TABS: 1.0000 | ORAL_TABLET | Freq: Every day | ORAL | Status: DC

## 2014-04-11 DIAGNOSIS — L570 Actinic keratosis: Secondary | ICD-10-CM | POA: Diagnosis not present

## 2014-04-11 DIAGNOSIS — C44529 Squamous cell carcinoma of skin of other part of trunk: Secondary | ICD-10-CM | POA: Diagnosis not present

## 2014-04-11 DIAGNOSIS — D485 Neoplasm of uncertain behavior of skin: Secondary | ICD-10-CM | POA: Diagnosis not present

## 2014-04-11 DIAGNOSIS — L821 Other seborrheic keratosis: Secondary | ICD-10-CM | POA: Diagnosis not present

## 2014-04-12 DIAGNOSIS — M48061 Spinal stenosis, lumbar region without neurogenic claudication: Secondary | ICD-10-CM | POA: Diagnosis not present

## 2014-05-13 ENCOUNTER — Other Ambulatory Visit: Payer: Self-pay | Admitting: Cardiology

## 2014-05-13 DIAGNOSIS — M48061 Spinal stenosis, lumbar region without neurogenic claudication: Secondary | ICD-10-CM

## 2014-05-13 DIAGNOSIS — G7000 Myasthenia gravis without (acute) exacerbation: Secondary | ICD-10-CM

## 2014-05-18 ENCOUNTER — Other Ambulatory Visit: Payer: Self-pay | Admitting: Cardiology

## 2014-06-07 DIAGNOSIS — M461 Sacroiliitis, not elsewhere classified: Secondary | ICD-10-CM | POA: Diagnosis not present

## 2014-06-08 DIAGNOSIS — C44529 Squamous cell carcinoma of skin of other part of trunk: Secondary | ICD-10-CM | POA: Diagnosis not present

## 2014-07-09 IMAGING — CT CT ANGIO CHEST
2 of 6 series · 13 of 36 positions shown · IV contrast (omnipaque)
Comparison: 01/28/2012.

CLINICAL DATA: Evaluate aneurysm.  Patient having back surgery on
[REDACTED].

CT ANGIOGRAPHY CHEST
TECHNIQUE: Multidetector CT imaging of the chest using the
standard protocol during bolus administration of intravenous
contrast. Multiplanar reconstructed images including MIPs were
obtained and reviewed to evaluate the vascular anatomy.
Contrast: 100mL OMNIPAQUE IOHEXOL 350 MG/ML SOLN

[Series 4: cta chest w/cm 3mm · axial · 0.67mm/px · z∈[-281,-56]mm · 12 of 89 slices shown]
[im 7/89  lung]
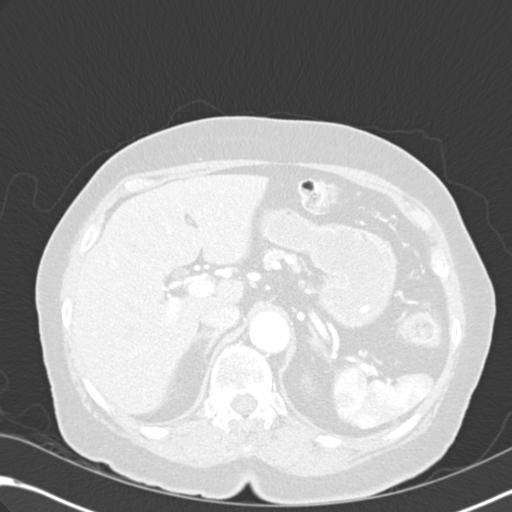
[im 14/89  mediastinal]
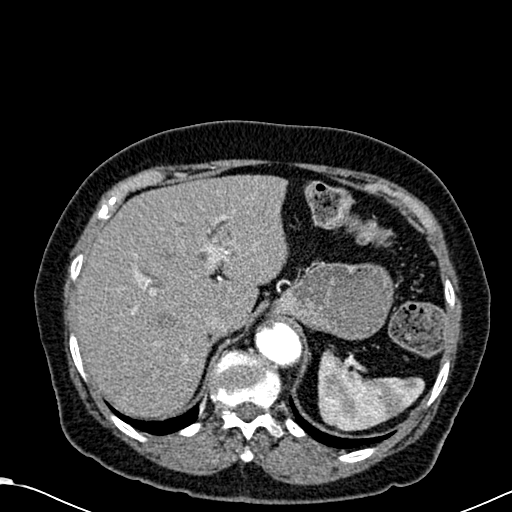
[im 21/89  lung]
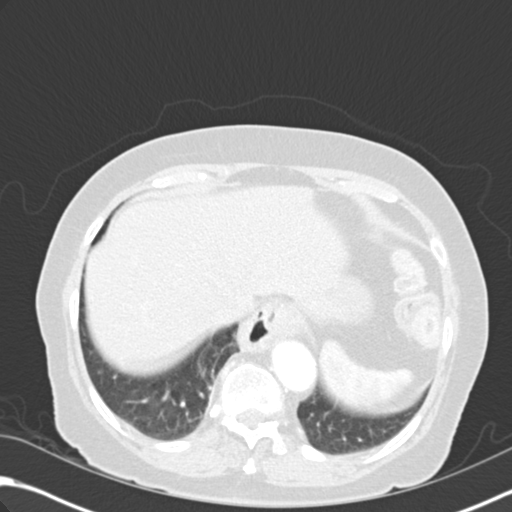
[im 28/89  mediastinal]
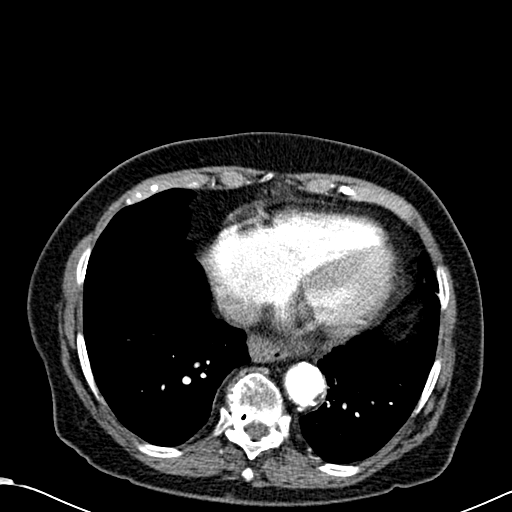
[im 34/89  lung]
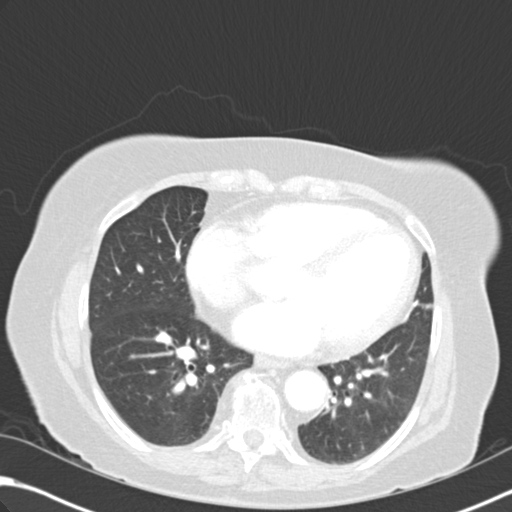
[im 41/89  mediastinal]
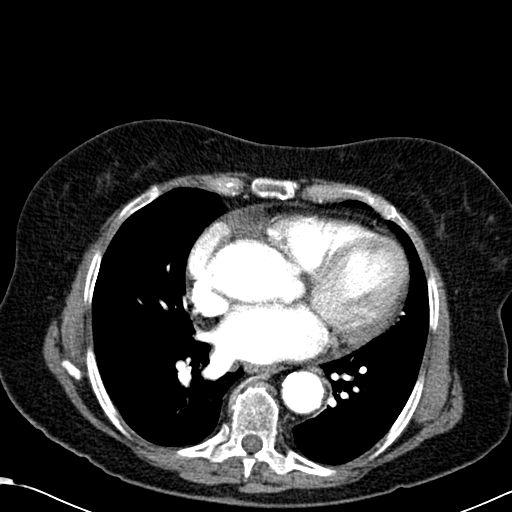
[im 48/89  lung]
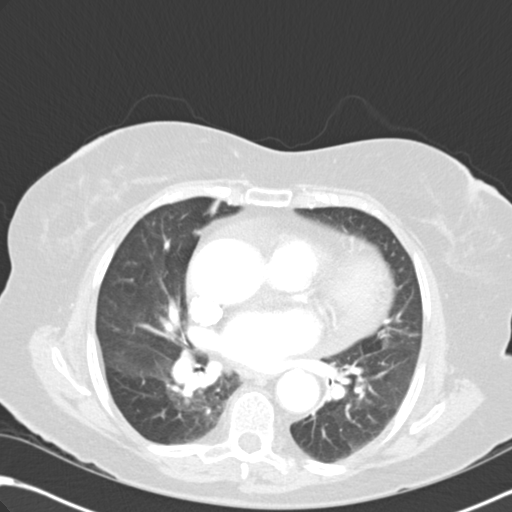
[im 55/89  mediastinal]
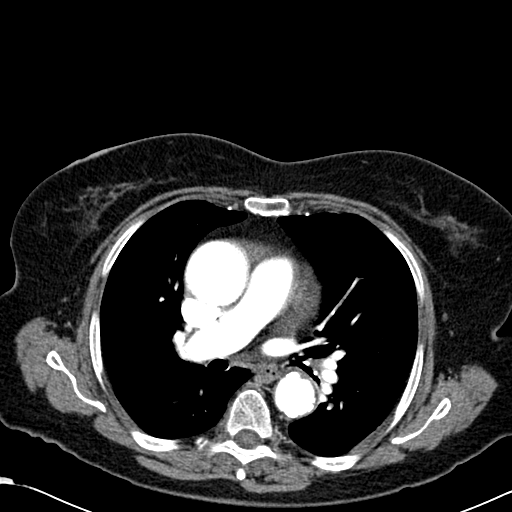
[im 61/89  lung]
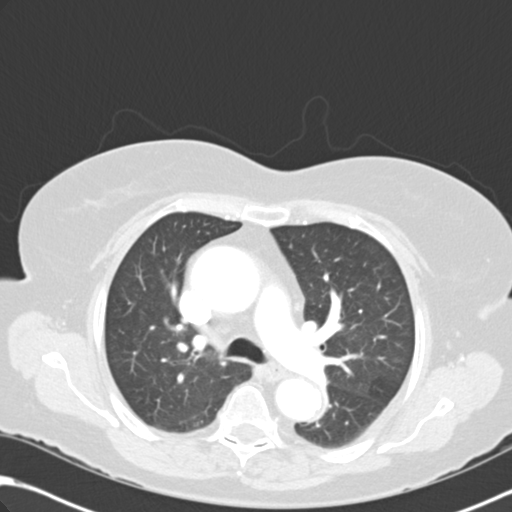
[im 68/89  mediastinal]
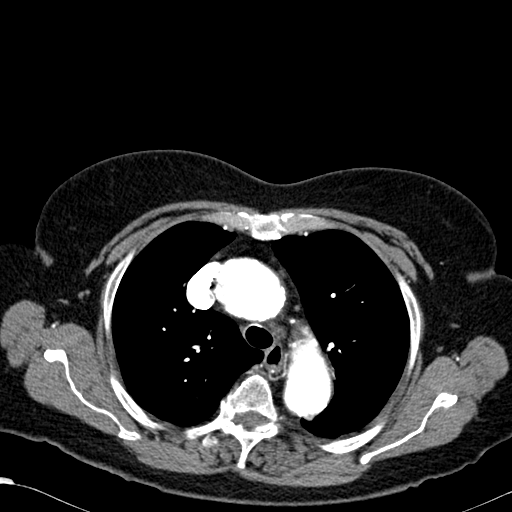
[im 75/89  lung]
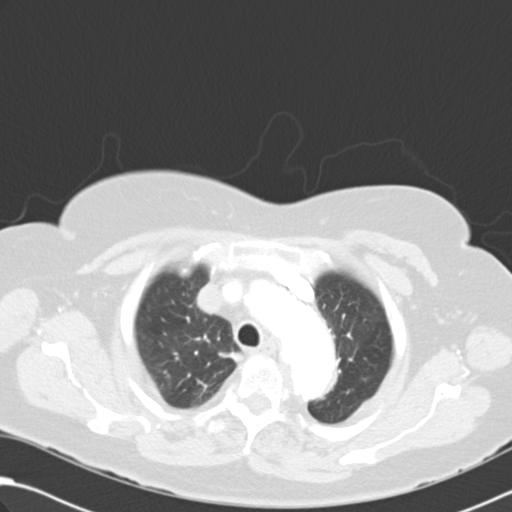
[im 82/89  mediastinal]
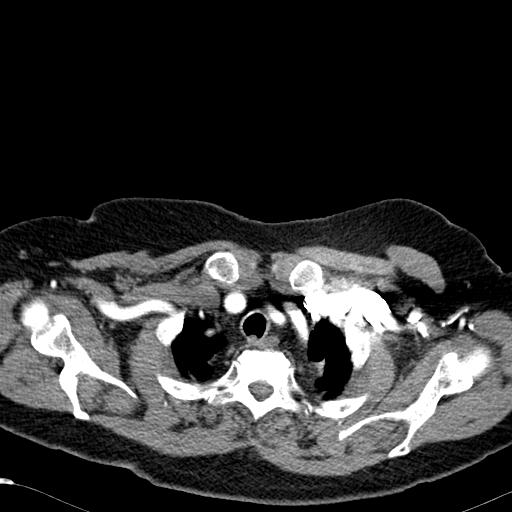

[Series 602: cor mpr · coronal · 0.67mm/px · 1 of 97 slices shown]
[im 49/97  mediastinal]
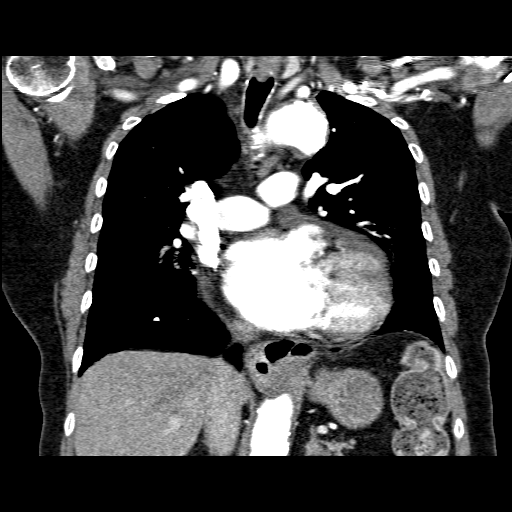

[13 of 36 positions shown; findings below may reference images not displayed]

FINDINGS: Ascending aortic aneurysm measures 44 mm.  The arch
measures 4 cm with ragged mural plaque with ulcerations.  There is
no dissection or acute aortic abnormality.  Descending thoracic
aorta demonstrates atherosclerosis with mural plaque and
calcification.  There is a moderate hiatal hernia present.  There
is no pericardial effusion.

Incidental imaging of the upper abdomen demonstrates
cholecystectomy.  There is post cholecystectomy, mild duct
dilation.  Thoracic spondylosis is present.  There is respiratory
motion on the examination.  The lungs show no airspace disease.
Mild bilateral pleural apical scarring.  No axillary, mediastinal,
or hilar adenopathy.
IMPRESSION: Unchanged thoracic aneurysm with maximal measurement 44 mm.  Severe
atherosclerosis with ulcerated plaque along the arch but no acute
abnormalities.

## 2014-07-18 ENCOUNTER — Ambulatory Visit: Payer: Medicare Other | Admitting: Cardiology

## 2014-07-26 ENCOUNTER — Encounter: Payer: Self-pay | Admitting: Cardiology

## 2014-07-26 ENCOUNTER — Ambulatory Visit (INDEPENDENT_AMBULATORY_CARE_PROVIDER_SITE_OTHER): Payer: Medicare Other | Admitting: Cardiology

## 2014-07-26 VITALS — BP 116/66 | HR 80 | Ht 65.0 in | Wt 157.0 lb

## 2014-07-26 DIAGNOSIS — G4700 Insomnia, unspecified: Secondary | ICD-10-CM | POA: Diagnosis not present

## 2014-07-26 DIAGNOSIS — R5381 Other malaise: Secondary | ICD-10-CM

## 2014-07-26 DIAGNOSIS — I119 Hypertensive heart disease without heart failure: Secondary | ICD-10-CM

## 2014-07-26 DIAGNOSIS — E7800 Pure hypercholesterolemia, unspecified: Secondary | ICD-10-CM | POA: Diagnosis not present

## 2014-07-26 DIAGNOSIS — R5383 Other fatigue: Secondary | ICD-10-CM

## 2014-07-26 MED ORDER — TEMAZEPAM 15 MG PO CAPS: 15.0000 mg | ORAL_CAPSULE | Freq: Every evening | ORAL | Status: DC | PRN

## 2014-07-26 NOTE — Assessment & Plan Note (Signed)
She complains of poor balance and feeling lightheaded at times.  Her blood pressure is running in the low normal range now.  We will cut back on her amlodipine to just 5 mg daily

## 2014-07-26 NOTE — Assessment & Plan Note (Signed)
She has been having insomnia.  She lives away for hours.  She has tried over-the-counter remedies containing Benadryl without improvement.  We'll give her a trial of temazepam 15 mg at bedtime when necessary

## 2014-07-26 NOTE — Progress Notes (Signed)
Tamara Gregory Date of Birth:  1924/12/26 [place] [address] [place], [place]  [address] [phone]        Fax   [phone]   History of Present Illness: This pleasant 80 year old woman is seen for a scheduled followup office visit. She has a past history of hypercholesterolemia and essential hypertension. She has a history of myasthenia gravis and is followed for that at [facility]. Since we last saw her in our office she has had successful back surgery by Dr. Gladstone Lighter. She has a known for 0.4 cm fusiform ascending aortic aneurysm which is unchanged over the past year. When the patient was initially admitted for her back surgery she had some chest pain in the preoperative holding area and her back surgery had to be postponed while she underwent a cardiac workup. She had a Myoview stress test which showed no ischemia. Her back surgery was subsequently rescheduled and performed without incident. She is making a good recovery from her back surgery. She is having some residual pain down her left leg her neurologist has placed her on gabapentin. She complains of lethargy and fatigue which may be secondary to her gabapentin which was recently increased to 1200 mg daily. The patient remains on low-dose prednisone 5 mg daily for her myasthenia gravis.  The patient earlier this year was hospitalized overnight at [facility] in [facility] for chest pain. She had a dobutamine stress echo the following morning which showed no evidence of ischemia and she was released. She was admitted with bradycardia and her metoprolol was stopped and has not been restarted. Since last visit she has complained of no energy.  She has had trouble falling asleep.  She has had diminished bladder control and has to wear depends.  Current Outpatient Prescriptions  Medication Sig Dispense Refill  . amLODipine (NORVASC) 5 MG tablet 1 tablet daily      . aspirin EC 81  MG tablet Take 81 mg by mouth daily.       . Calcium Carbonate (CALCIUM 500 PO) Take 1 capsule by mouth daily.       . cholecalciferol (VITAMIN D) 1000 UNITS tablet Take 1,000 Units by mouth daily.       . citalopram (CELEXA) 40 MG tablet TAKE 1 TABLET EVERY DAY BEFORE BREAKFAST  90 tablet  3  . folic acid (FOLVITE) 970 MCG tablet Take 400 mcg by mouth daily.      [name] gabapentin (NEURONTIN) 100 MG capsule 1200 mg daily      . Glucosamine-Chondroit-Vit C-Mn (GLUCOSAMINE 1500 COMPLEX PO) Take by mouth daily.       [name] losartan-hydrochlorothiazide (HYZAAR) 100-25 MG per tablet Take 1 tablet by mouth daily before breakfast.  90 tablet  1  . Multiple Vitamin (MULTIVITAMIN) tablet Take 1 tablet by mouth daily.       . Multiple Vitamins-Minerals (PRESERVISION AREDS) CAPS Take 2 capsules by mouth daily.      . Omega-3 Fatty Acids (FISH OIL) 435 MG CAPS Take 1 capsule by mouth daily.      [name] omeprazole (PRILOSEC) 20 MG capsule Take 20 mg by mouth daily.       . ondansetron (ZOFRAN) 4 MG tablet Take 1 tablet (4 mg total) by mouth every 8 (eight) hours as needed for nausea.  15 tablet  0  . potassium chloride SA (KLOR-CON M20) 20 MEQ tablet Take 1 tablet (20 mEq total) by mouth 2 (two) times daily.  180 tablet  3  . predniSONE (DELTASONE) 10 MG tablet TAKE 1 TABLET EVERY DAY BEFORE BREAKFAST  90 tablet  3  . predniSONE (DELTASONE) 5 MG tablet Take 5 mg by mouth daily with breakfast.      . meloxicam (MOBIC) 15 MG tablet        No current facility-administered medications for this visit.    No Known Allergies  Patient Active Problem List   Diagnosis Date Noted  . Insomnia 07/26/2014  . Malaise and fatigue 10/04/2013  . PAC (premature atrial contraction) 10/04/2013  . Depression (emotion) 02/15/2013  . Spinal stenosis, lumbar region, with neurogenic claudication 02/04/2013  . Myasthenia gravis 03/17/2012  . Pure hypercholesterolemia 09/05/2011  . Benign hypertensive heart disease without heart failure  09/05/2011  . Osteoarthritis 09/05/2011    History  Smoking status  . Never Smoker   Smokeless tobacco  . Never Used    History  Alcohol Use No    Family History  Problem Relation Age of Onset  . Heart attack Father   . Cancer Sister   . Cancer Brother   . Cancer Sister   . Cancer Sister     Review of Systems: Constitutional: no fever chills diaphoresis or fatigue or change in weight.  Head and neck: no hearing loss, no epistaxis, no photophobia or visual disturbance. Respiratory: No cough, shortness of breath or wheezing. Cardiovascular: No chest pain peripheral edema, palpitations. Gastrointestinal: No abdominal distention, no abdominal pain, no change in bowel habits hematochezia or melena. Genitourinary: No dysuria, no frequency, no urgency, no nocturia. Musculoskeletal:No arthralgias, no back pain, no gait disturbance or myalgias. Neurological: No dizziness, no headaches, no numbness, no seizures, no syncope, no weakness, no tremors. Hematologic: No lymphadenopathy, no easy bruising. Psychiatric: No confusion, no hallucinations, no sleep disturbance.    Physical Exam: Filed Vitals:   07/26/14 1358  BP: 116/66  Pulse: 80   The patient appears to be in no distress.  Head and neck exam reveals that the pupils are equal and reactive.  The extraocular movements are full.  There is no scleral icterus.  Mouth and pharynx are benign.  No lymphadenopathy.  No carotid bruits.  The jugular venous pressure is normal.  Thyroid is not enlarged or tender.  Chest is clear to percussion and auscultation.  No rales or rhonchi.  Expansion of the chest is symmetrical.  Heart reveals no abnormal lift or heave.  First and second heart sounds are normal.  There is no murmur gallop rub or click.  The abdomen is soft and nontender.  Bowel sounds are normoactive.  There is no hepatosplenomegaly or mass.  There are no abdominal bruits.  Extremities reveal no phlebitis or edema.  Pedal  pulses are good.  There is no cyanosis or clubbing.  Neurologic exam is normal strength and no lateralizing weakness.  No sensory deficits.  Integument reveals no rash  EKG shows normal sinus rhythm and no ischemic changes.   Assessment / Plan: 1.  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 Hypercholesterolemia 3. myasthenia gravis followed at [facility] 4. past history of spinal stenosis with chronic low back pain 5. Insomnia 6. malaise and fatigue  Plan: Reduce amlodipine to 5 mg daily.  Trial of temazepam 15 mg at bedtime when necessary for sleep Stop statins temporarily and see if leg weakness improves. She did not come fasting for her labs today so we will have her return in the future for fasting lab work and CBC lipid  panel hepatic function panel and basal metabolic panel. Return in 6 months for followup office visit in May at work

## 2014-07-26 NOTE — Patient Instructions (Addendum)
DECREASE YOUR ASPIRIN TO 81 MG DAILY  DECREASE YOUR AMLODIPINE TO 5 MG DAILY  STOP YOUR PRAVASTATIN   START TEMAZEPAM 15 MG AT BEDTIME AS NEEDED FOR SLEEP   Your physician wants you to follow-up in: 6 months with fasting labs (lp/bmet/hfp)  You will receive a reminder letter in the mail two months in advance. If you don't receive a letter, please call our office to schedule the follow-up appointment.   Return Thursday morning for fasting labs

## 2014-07-26 NOTE — Assessment & Plan Note (Signed)
She has been on pravastatin for her hypercholesterolemia.  She complains of leg weakness and malaise and fatigue.  We will hold her pravastatin and see if the symptoms improve.

## 2014-07-27 ENCOUNTER — Telehealth: Payer: Self-pay | Admitting: Cardiology

## 2014-07-27 DIAGNOSIS — M545 Low back pain, unspecified: Secondary | ICD-10-CM | POA: Diagnosis not present

## 2014-07-27 DIAGNOSIS — M47817 Spondylosis without myelopathy or radiculopathy, lumbosacral region: Secondary | ICD-10-CM | POA: Diagnosis not present

## 2014-07-27 NOTE — Telephone Encounter (Signed)
Spoke with patients husband and when he went home yesterday he realized patient was only taking Amlodipine 5 mg daily (not 10 as thought at appointment yesterday). Was advised to decrease her dose from 10 mg to 5 mg at visit. Discussed with  Dr. Mare Ferrari and will have patient decrease to just 2.5 mg daily. Advised husband.

## 2014-07-27 NOTE — Telephone Encounter (Signed)
New message     Pt was seen yesterday---talk to [facility]

## 2014-07-28 ENCOUNTER — Other Ambulatory Visit (INDEPENDENT_AMBULATORY_CARE_PROVIDER_SITE_OTHER): Payer: Medicare Other

## 2014-07-28 DIAGNOSIS — R5381 Other malaise: Secondary | ICD-10-CM

## 2014-07-28 DIAGNOSIS — I119 Hypertensive heart disease without heart failure: Secondary | ICD-10-CM

## 2014-07-28 DIAGNOSIS — R5383 Other fatigue: Secondary | ICD-10-CM

## 2014-07-28 DIAGNOSIS — E7800 Pure hypercholesterolemia, unspecified: Secondary | ICD-10-CM | POA: Diagnosis not present

## 2014-07-28 LAB — HEPATIC FUNCTION PANEL
ALBUMIN: 4.1 g/dL (ref 3.5–5.2)
ALK PHOS: 50 U/L (ref 39–117)
ALT: 23 U/L (ref 0–35)
AST: 24 U/L (ref 0–37)
BILIRUBIN DIRECT: 0 mg/dL (ref 0.0–0.3)
TOTAL PROTEIN: 7.1 g/dL (ref 6.0–8.3)
Total Bilirubin: 0.8 mg/dL (ref 0.2–1.2)

## 2014-07-28 LAB — LIPID PANEL
CHOLESTEROL: 188 mg/dL (ref 0–200)
HDL: 70 mg/dL (ref >39.00)
LDL Cholesterol: 104 mg/dL — ABNORMAL HIGH (ref 0–99)
NonHDL: 118
Total CHOL/HDL Ratio: 3
Triglycerides: 71 mg/dL (ref 0.0–149.0)
VLDL: 14.2 mg/dL (ref 0.0–40.0)

## 2014-07-28 LAB — CBC WITH DIFFERENTIAL/PLATELET
Basophils Absolute: 0 10*3/uL (ref 0.0–0.1)
Basophils Relative: 0.2 % (ref 0.0–3.0)
EOS PCT: 0 % (ref 0.0–5.0)
Eosinophils Absolute: 0 10*3/uL (ref 0.0–0.7)
HCT: 35.9 % — ABNORMAL LOW (ref 36.0–46.0)
Hemoglobin: 11.8 g/dL — ABNORMAL LOW (ref 12.0–15.0)
LYMPHS PCT: 9.7 % — AB (ref 12.0–46.0)
Lymphs Abs: 0.9 10*3/uL (ref 0.7–4.0)
MCHC: 33 g/dL (ref 30.0–36.0)
MCV: 97.3 fl (ref 78.0–100.0)
Monocytes Absolute: 0.6 10*3/uL (ref 0.1–1.0)
Monocytes Relative: 6.7 % (ref 3.0–12.0)
NEUTROS PCT: 83.4 % — AB (ref 43.0–77.0)
Neutro Abs: 7.5 10*3/uL (ref 1.4–7.7)
PLATELETS: 252 10*3/uL (ref 150.0–400.0)
RBC: 3.69 Mil/uL — AB (ref 3.87–5.11)
RDW: 14.5 % (ref 11.5–15.5)
WBC: 9 10*3/uL (ref 4.0–10.5)

## 2014-07-28 LAB — BASIC METABOLIC PANEL
BUN: 26 mg/dL — AB (ref 6–23)
CALCIUM: 9.7 mg/dL (ref 8.4–10.5)
CO2: 26 mEq/L (ref 19–32)
Chloride: 103 mEq/L (ref 96–112)
Creatinine, Ser: 0.9 mg/dL (ref 0.4–1.2)
GFR: 67.16 mL/min (ref >60.00)
Glucose, Bld: 110 mg/dL — ABNORMAL HIGH (ref 70–99)
Potassium: 4 mEq/L (ref 3.5–5.1)
Sodium: 137 mEq/L (ref 135–145)

## 2014-07-28 NOTE — Progress Notes (Signed)
Quick Note:  Please report to patient. The recent labs are stable. Continue same medication and careful diet. Lipids are okay. CBC shows hemoglobin down slightly.Recheck CBC and get anemia panel in 1 month ______

## 2014-08-16 DIAGNOSIS — M47817 Spondylosis without myelopathy or radiculopathy, lumbosacral region: Secondary | ICD-10-CM | POA: Diagnosis not present

## 2014-08-18 DIAGNOSIS — Z23 Encounter for immunization: Secondary | ICD-10-CM | POA: Diagnosis not present

## 2014-09-13 ENCOUNTER — Telehealth: Payer: Self-pay | Admitting: *Deleted

## 2014-09-13 DIAGNOSIS — D649 Anemia, unspecified: Secondary | ICD-10-CM

## 2014-09-13 NOTE — Telephone Encounter (Signed)
Message copied by Earvin Hansen on Tue Sep 13, 2014 10:37 AM ------      Message from: Darlin Coco      Created: Thu Jul 28, 2014  9:00 PM       Please report to patient.  The recent labs are stable. Continue same medication and careful diet. Lipids are okay.  CBC shows hemoglobin down slightly.Recheck CBC and get anemia panel in 1 month ------

## 2014-09-13 NOTE — Telephone Encounter (Signed)
    Notes Recorded by Earvin Hansen on 09/13/2014 at 10:14 AM Spoke with husband and scheduled follow up labs Notes Recorded by Hetty Blend, RN on 07/29/2014 at 11:54 AM Notified of lab results. She will call back to schedule CBC, anemia panel in one month since she will be out of town. Order not placed

## 2014-09-16 ENCOUNTER — Telehealth: Payer: Self-pay | Admitting: Cardiology

## 2014-09-16 NOTE — Telephone Encounter (Signed)
We should probably see for OV to evaluate further (Not Monday however--could do Thurs or Fri)

## 2014-09-16 NOTE — Telephone Encounter (Signed)
Spoke with husband and patient is getting weaker and has fallen a couple of times recently. Patient is getting another injection on 09/27/14. Husband is concerned that she is getting more depressed. Patient is scheduled for follow up labs on Tuesday. Husband would like  Dr. Mare Ferrari to review medications to see if any changes or increases could be made. Husband is ok bringing patient in for OV if recommended. Will forward to  Dr. Mare Ferrari for review

## 2014-09-16 NOTE — Telephone Encounter (Signed)
New message    Husband calling stating his wife not doing well. H/o back pain.  Will discuss further when [place] calls back.

## 2014-09-19 NOTE — Telephone Encounter (Signed)
Scheduled appointment for Thursday, husband aware

## 2014-09-20 ENCOUNTER — Other Ambulatory Visit: Payer: Medicare Other

## 2014-09-22 ENCOUNTER — Other Ambulatory Visit (INDEPENDENT_AMBULATORY_CARE_PROVIDER_SITE_OTHER): Payer: Medicare Other | Admitting: *Deleted

## 2014-09-22 ENCOUNTER — Ambulatory Visit (INDEPENDENT_AMBULATORY_CARE_PROVIDER_SITE_OTHER): Payer: Medicare Other | Admitting: Cardiology

## 2014-09-22 ENCOUNTER — Encounter: Payer: Self-pay | Admitting: Cardiology

## 2014-09-22 ENCOUNTER — Ambulatory Visit
Admission: RE | Admit: 2014-09-22 | Discharge: 2014-09-22 | Disposition: A | Discharge status: Home / Self Care | Payer: Medicare Other | Source: Ambulatory Visit | Attending: Cardiology | Admitting: Cardiology

## 2014-09-22 VITALS — BP 136/82 | HR 78 | Ht 65.0 in | Wt 158.4 lb

## 2014-09-22 DIAGNOSIS — E7800 Pure hypercholesterolemia, unspecified: Secondary | ICD-10-CM

## 2014-09-22 DIAGNOSIS — D649 Anemia, unspecified: Secondary | ICD-10-CM

## 2014-09-22 DIAGNOSIS — I119 Hypertensive heart disease without heart failure: Secondary | ICD-10-CM

## 2014-09-22 DIAGNOSIS — K449 Diaphragmatic hernia without obstruction or gangrene: Secondary | ICD-10-CM | POA: Diagnosis not present

## 2014-09-22 DIAGNOSIS — R0789 Other chest pain: Secondary | ICD-10-CM | POA: Diagnosis not present

## 2014-09-22 DIAGNOSIS — R296 Repeated falls: Secondary | ICD-10-CM

## 2014-09-22 DIAGNOSIS — F329 Major depressive disorder, single episode, unspecified: Secondary | ICD-10-CM

## 2014-09-22 DIAGNOSIS — F32A Depression, unspecified: Secondary | ICD-10-CM

## 2014-09-22 DIAGNOSIS — R42 Dizziness and giddiness: Secondary | ICD-10-CM

## 2014-09-22 DIAGNOSIS — G7000 Myasthenia gravis without (acute) exacerbation: Secondary | ICD-10-CM

## 2014-09-22 DIAGNOSIS — S299XXA Unspecified injury of thorax, initial encounter: Secondary | ICD-10-CM | POA: Diagnosis not present

## 2014-09-22 DIAGNOSIS — R29898 Other symptoms and signs involving the musculoskeletal system: Secondary | ICD-10-CM

## 2014-09-22 LAB — CBC WITH DIFFERENTIAL/PLATELET
Basophils Absolute: 0.1 10*3/uL (ref 0.0–0.1)
Basophils Relative: 0.5 % (ref 0.0–3.0)
EOS PCT: 0.7 % (ref 0.0–5.0)
Eosinophils Absolute: 0.1 10*3/uL (ref 0.0–0.7)
HCT: 38.8 % (ref 36.0–46.0)
HEMOGLOBIN: 12.6 g/dL (ref 12.0–15.0)
Lymphocytes Relative: 9.1 % — ABNORMAL LOW (ref 12.0–46.0)
Lymphs Abs: 1 10*3/uL (ref 0.7–4.0)
MCHC: 32.4 g/dL (ref 30.0–36.0)
MCV: 97.6 fl (ref 78.0–100.0)
Monocytes Absolute: 0.7 10*3/uL (ref 0.1–1.0)
Monocytes Relative: 7 % (ref 3.0–12.0)
NEUTROS ABS: 8.8 10*3/uL — AB (ref 1.4–7.7)
NEUTROS PCT: 82.7 % — AB (ref 43.0–77.0)
Platelets: 250 10*3/uL (ref 150.0–400.0)
RBC: 3.97 Mil/uL (ref 3.87–5.11)
RDW: 14.8 % (ref 11.5–15.5)
WBC: 10.6 10*3/uL — AB (ref 4.0–10.5)

## 2014-09-22 NOTE — Assessment & Plan Note (Signed)
She remains moderately depressed according to her husband.  She is on citalopram.  Continue current medication.

## 2014-09-22 NOTE — Assessment & Plan Note (Signed)
At her last visit her hemoglobin was low.  She denies any hematochezia or melena.  She is not malnourished.  We are rechecking a CBC and an anemia panel today.

## 2014-09-22 NOTE — Progress Notes (Signed)
Tamara Gregory Date of Birth:  1925-02-02 [place] [address] [place], [place]  [address] [phone]        Fax   [phone]   History of Present Illness: This pleasant 80 year old woman is seen for a scheduled followup office visit. She has a past history of hypercholesterolemia and essential hypertension. She has a history of myasthenia gravis and is followed for that at [facility]. Since we last saw her in our office she has had successful back surgery by Dr. Gladstone Lighter. She has a known 4 cm fusiform ascending aortic aneurysm which is unchanged over the past year. When the patient was initially admitted for her back surgery she had some chest pain in the preoperative holding area and her back surgery had to be postponed while she underwent a cardiac workup. She had a Myoview stress test which showed no ischemia. Her back surgery was subsequently rescheduled and performed without incident. She made a good recovery from her back surgery. She is having some residual pain down her left leg and her neurologist  placed her on gabapentin. She complains of lethargy and fatigue which may be secondary to her gabapentin which was recently increased to 1200 mg daily. The patient remains on low-dose prednisone 5 mg daily for her myasthenia gravis.  The patient earlier this year was hospitalized overnight at [facility] in [facility] for chest pain. She had a dobutamine stress echo the following morning which showed no evidence of ischemia and she was released. She was admitted with bradycardia and her metoprolol was stopped and has not been restarted. The patient is concerned about leg weakness.  She has had frequent falls.  3 weeks ago she fell and landed on the right side of her chest.  She is still having discomfort.  We will get a chest x-ray. She is followed at the pain clinic at [facility]. She has been having some intermittent  dizziness worse with quick movements. At her last visit she was found to be mildly anemic.  We're following up that today. The family is requesting physical therapy for leg strengthening. Since last visit she has complained of no energy.  She has had trouble falling asleep.  She has had diminished bladder control and has to wear depends.  Current Outpatient Prescriptions  Medication Sig Dispense Refill  . amLODipine (NORVASC) 5 MG tablet 1/2 tablet daily      . aspirin EC 81 MG tablet Take 81 mg by mouth daily.       . Calcium Carbonate (CALCIUM 500 PO) Take 1 capsule by mouth daily.       . cholecalciferol (VITAMIN D) 1000 UNITS tablet Take 1,000 Units by mouth daily.       . citalopram (CELEXA) 40 MG tablet TAKE 1 TABLET EVERY DAY BEFORE BREAKFAST  90 tablet  3  . folic acid (FOLVITE) 130 MCG tablet Take 400 mcg by mouth daily.      [name] gabapentin (NEURONTIN) 100 MG capsule 300 mg 4 (four) times daily. 1200 mg daily      . Glucosamine-Chondroit-Vit C-Mn (GLUCOSAMINE 1500 COMPLEX PO) Take by mouth daily.       [name] losartan-hydrochlorothiazide (HYZAAR) 100-25 MG per tablet Take 1 tablet by mouth daily before breakfast.  90 tablet  1  . Multiple Vitamin (MULTIVITAMIN) tablet Take 1 tablet by mouth daily.       . Multiple Vitamins-Minerals (PRESERVISION AREDS) CAPS Take 2 capsules by mouth  daily.      . Omega-3 Fatty Acids (FISH OIL) 435 MG CAPS Take 1 capsule by mouth daily.      [name] omeprazole (PRILOSEC) 20 MG capsule Take 20 mg by mouth daily.       . ondansetron (ZOFRAN) 4 MG tablet Take 1 tablet (4 mg total) by mouth every 8 (eight) hours as needed for nausea.  15 tablet  0  . potassium chloride SA (KLOR-CON M20) 20 MEQ tablet Take 1 tablet (20 mEq total) by mouth 2 (two) times daily.  180 tablet  3  . predniSONE (DELTASONE) 10 MG tablet TAKE 1 TABLET EVERY DAY BEFORE BREAKFAST  90 tablet  3  . temazepam (RESTORIL) 15 MG capsule Take 1 capsule (15 mg total) by mouth at bedtime as needed for  sleep.  30 capsule  5   No current facility-administered medications for this visit.    No Known Allergies  Patient Active Problem List   Diagnosis Date Noted  . Low hemoglobin 09/22/2014  . Insomnia 07/26/2014  . Malaise and fatigue 10/04/2013  . PAC (premature atrial contraction) 10/04/2013  . Depression (emotion) 02/15/2013  . Spinal stenosis, lumbar region, with neurogenic claudication 02/04/2013  . Myasthenia gravis 03/17/2012  . Pure hypercholesterolemia 09/05/2011  . Benign hypertensive heart disease without heart failure 09/05/2011  . Osteoarthritis 09/05/2011    History  Smoking status  . Never Smoker   Smokeless tobacco  . Never Used    History  Alcohol Use No    Family History  Problem Relation Age of Onset  . Heart attack Father   . Cancer Sister   . Cancer Brother   . Cancer Sister   . Cancer Sister     Review of Systems: Constitutional: no fever chills diaphoresis or fatigue or change in weight.  Head and neck: no hearing loss, no epistaxis, no photophobia or visual disturbance. Respiratory: No cough, shortness of breath or wheezing. Cardiovascular: No chest pain peripheral edema, palpitations. Gastrointestinal: No abdominal distention, no abdominal pain, no change in bowel habits hematochezia or melena. Genitourinary: No dysuria, no frequency, no urgency, no nocturia. Musculoskeletal:No arthralgias, no back pain, no gait disturbance or myalgias. Neurological: No dizziness, no headaches, no numbness, no seizures, no syncope, no weakness, no tremors. Hematologic: No lymphadenopathy, no easy bruising. Psychiatric: No confusion, no hallucinations, no sleep disturbance.    Physical Exam: Filed Vitals:   09/22/14 1003  BP: 136/82  Pulse: 78   The patient appears to be in no distress.  Head and neck exam reveals that the pupils are equal and reactive.  The extraocular movements are full.  There is no scleral icterus.  Mouth and pharynx are  benign.  No lymphadenopathy.  No carotid bruits.  The jugular venous pressure is normal.  Thyroid is not enlarged or tender.  Chest is clear to percussion and auscultation.  No rales or rhonchi.  Expansion of the chest is symmetrical.  Heart reveals no abnormal lift or heave.  First and second heart sounds are normal.  There is no murmur gallop rub or click.  The abdomen is soft and nontender.  Bowel sounds are normoactive.  There is no hepatosplenomegaly or mass.  There are no abdominal bruits.  Extremities reveal no phlebitis or edema.  Pedal pulses are good.  There is no cyanosis or clubbing.  Neurologic exam is normal strength and no lateralizing weakness.  No sensory deficits.  Integument reveals no rash     Assessment / Plan: 1.  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 Hypercholesterolemia, currently off statin therapy to see if leg weakness will improve. 3. myasthenia gravis followed at [facility] 4. past history of spinal stenosis with chronic low back pain 5. Insomnia 6. malaise and fatigue 7. anemia etiology to be determined 8. Depression 9. frequent falls and poor balance  Disposition: We're checking a CBC and anemia panel today. We will request outpatient physical therapy for leg strengthening and to help her balance. We will get a chest x-ray to followup on her right-sided chest discomfort probably secondary to bruised ribs She be rechecked on a regular visit for office visit and lab work in about 4 months, or sooner when necessary

## 2014-09-22 NOTE — Patient Instructions (Addendum)
Will obtain labs today and call you with the results (CBC,IBC,FERRITIN)  Your physician recommends that you continue on your current medications as directed. Please refer to the Current Medication list given to you today.  KEEP YOUR APPOINTMENT AS SCHEDULED   Will arrange for you to go to physical therapy  A chest x-ray takes a picture of the organs and structures inside the chest, including the heart, lungs, and blood vessels. This test can show several things, including, whether the heart is enlarges; whether fluid is building up in the lungs; and whether pacemaker / defibrillator leads are still in place. [place]

## 2014-09-22 NOTE — Assessment & Plan Note (Signed)
Blood pressure has been stable with current therapy.

## 2014-09-22 NOTE — Assessment & Plan Note (Signed)
Her last visit we stopped her statin therapy because of her leg weakness.  So far she has not noticed any improvement in her leg strength.  We will continue to omit her statin for the time being.

## 2014-09-23 LAB — IBC PANEL
IRON: 104 ug/dL (ref 42–145)
Saturation Ratios: 25.8 % (ref 20.0–50.0)
Transferrin: 287.7 mg/dL (ref 212.0–360.0)

## 2014-09-23 LAB — FERRITIN: Ferritin: 25.1 ng/mL (ref 10.0–291.0)

## 2014-09-23 NOTE — Progress Notes (Signed)
Quick Note:  Please report to patient. The recent labs are stable. Continue same medication and careful diet. The serum iron studies were normal. ______

## 2014-09-26 ENCOUNTER — Telehealth: Payer: Self-pay | Admitting: *Deleted

## 2014-09-26 DIAGNOSIS — R2681 Unsteadiness on feet: Secondary | ICD-10-CM

## 2014-09-26 DIAGNOSIS — R29898 Other symptoms and signs involving the musculoskeletal system: Secondary | ICD-10-CM

## 2014-09-26 NOTE — Telephone Encounter (Signed)
Message copied by Earvin Hansen on Mon Sep 26, 2014  6:39 PM ------      Message from: Darlin Coco      Created: Thu Sep 22, 2014  8:19 PM       Please report.  The Hgb is back up to 12.6 so is no longer anemic. ------

## 2014-09-26 NOTE — Telephone Encounter (Signed)
Message copied by Earvin Hansen on Mon Sep 26, 2014  6:39 PM ------      Message from: Darlin Coco      Created: Fri Sep 23, 2014  5:06 PM       Please report to patient.  The recent labs are stable. Continue same medication and careful diet.  The serum iron studies were normal. ------

## 2014-09-26 NOTE — Telephone Encounter (Signed)
Message copied by Earvin Hansen on Mon Sep 26, 2014  6:39 PM ------      Message from: Darlin Coco      Created: Thu Sep 22, 2014  8:20 PM       Please report.  The chest xray is stable. No change.No fractures of ribs seen. ------

## 2014-09-27 DIAGNOSIS — M545 Low back pain: Secondary | ICD-10-CM | POA: Diagnosis not present

## 2014-09-27 DIAGNOSIS — M961 Postlaminectomy syndrome, not elsewhere classified: Secondary | ICD-10-CM | POA: Diagnosis not present

## 2014-09-27 NOTE — Telephone Encounter (Signed)
Advised husband of labs, xray, and appointment for PT

## 2014-09-29 DIAGNOSIS — C44622 Squamous cell carcinoma of skin of right upper limb, including shoulder: Secondary | ICD-10-CM | POA: Diagnosis not present

## 2014-09-29 DIAGNOSIS — D492 Neoplasm of unspecified behavior of bone, soft tissue, and skin: Secondary | ICD-10-CM | POA: Diagnosis not present

## 2014-09-29 DIAGNOSIS — L570 Actinic keratosis: Secondary | ICD-10-CM | POA: Diagnosis not present

## 2014-09-29 DIAGNOSIS — Z85828 Personal history of other malignant neoplasm of skin: Secondary | ICD-10-CM | POA: Diagnosis not present

## 2014-09-29 DIAGNOSIS — C44722 Squamous cell carcinoma of skin of right lower limb, including hip: Secondary | ICD-10-CM | POA: Diagnosis not present

## 2014-10-17 ENCOUNTER — Ambulatory Visit: Payer: Medicare Other | Attending: Cardiology | Admitting: Physical Therapy

## 2014-10-17 ENCOUNTER — Encounter: Payer: Self-pay | Admitting: Physical Therapy

## 2014-10-17 DIAGNOSIS — R2681 Unsteadiness on feet: Secondary | ICD-10-CM

## 2014-10-17 DIAGNOSIS — R29898 Other symptoms and signs involving the musculoskeletal system: Secondary | ICD-10-CM

## 2014-10-17 DIAGNOSIS — R269 Unspecified abnormalities of gait and mobility: Secondary | ICD-10-CM | POA: Diagnosis not present

## 2014-10-17 DIAGNOSIS — Z5189 Encounter for other specified aftercare: Secondary | ICD-10-CM | POA: Insufficient documentation

## 2014-10-17 NOTE — Therapy (Signed)
Physical Therapy Evaluation  Patient Details  Name: Tamara Gregory MRN: [id number] Date of Birth: 09-17-1925  Encounter Date: 10/17/2014      PT End of Session - 10/17/14 1412    Visit Number 1   Number of Visits 16   Date for PT Re-Evaluation 12/16/14   PT Start Time 1315   PT Stop Time 1404   PT Time Calculation (min) 49 min   Equipment Utilized During Treatment Gait belt   Activity Tolerance Patient tolerated treatment well   Behavior During Therapy WFL for tasks assessed/performed      Past Medical History  Diagnosis Date  . Hypertension   . Hyperlipidemia   . Depression   . History of migraines   . Vitamin D deficiency   . GERD (gastroesophageal reflux disease)   . Headache(784.0)   . Arthritis   . Cancer     hx of skin cancer   . Macular degeneration   . Myasthenia gravis   . Thoracic aneurysm     4.4cm; see CT done 01/28/12 and 01/22/13 in [facility].   [name] Spinal stenosis     Past Surgical History  Procedure Laterality Date  . Cholecystectomy    . US echocardiography  05/18/2007    EF 55-60%  . Abdominal hysterectomy    . Tonsillectomy    . Lumbar laminectomy/decompression microdiscectomy N/A 02/04/2013    Procedure: CENTRAL DECOMPRESSION/LUMBAR LAMINECTOMY L3-L4 AND L4-L5 2 LEVELS;  Surgeon: Tobi Bastos, MD;  Location: [facility];  Service: Orthopedics;  Laterality: N/A;    There were no vitals taken for this visit.  Visit Diagnosis:  Unsteady gait - Plan: PT plan of care cert/re-cert  Weakness of both lower extremities - Plan: PT plan of care cert/re-cert  Abnormality of gait - Plan: PT plan of care cert/re-cert      Subjective Assessment - 10/17/14 1318    Symptoms Pt. is an 80 y/o female who presents to OPPT for difficulty with mobility, weakness and falls.  Pt. and husband report symptoms began about 6 months prior to surgery (March 2013).  Pt s/p decomression and laminectomy March 2014.     Limitations Standing;Walking;House hold activities   How  long can you stand comfortably? 30 min   How long can you walk comfortably? 50-100'   Patient Stated Goals decrease pain; improve strength; walk without cane/walker   Currently in Pain? Yes   Pain Score 10-Worst pain ever   Pain Location Back   Pain Orientation Lower;Mid   Pain Descriptors / Indicators Aching   Pain Onset More than a month ago   Pain Frequency Other (Comment)  provoked with standing and movement   Aggravating Factors  standing/walking/any activities   Pain Relieving Factors lying down, rest   Effect of Pain on Daily Activities limited with ADLs, decreased mobility around the house due to pain   Multiple Pain Sites No          [facility] PT Assessment - 10/17/14 1335    Assessment   Medical Diagnosis back pain/multiple falls   Onset Date 02/04/13   Next MD Visit Feb 2016   Prior Therapy following back surgery; HHPT x 5-6 visits   Precautions   Precautions Fall   Restrictions   Weight Bearing Restrictions No   Home Environment   Living Enviornment Private residence   Living Arrangements Spouse/significant other   Available Help at Discharge Available 24 hours/day   Type of [place] to enter  Entrance Stairs-Number of Steps 1   Entrance Stairs-Rails None  doorframe only   Home Layout One level   [place] - quad;Walker - 2 wheels;Walker - 4 wheels   Prior Function   Level of Independence Requires assistive device for independence;Independent with transfers  pt reports minimal use of device   Cognition   Overall Cognitive Status Within Functional Limits for tasks assessed   Observation/Other Assessments   Observations BERG 31/56; timed up and go 46.09 sec.  Pt with 4 falls in 6 months with functional mobility and strength deficits.   Strength   Right Hip Flexion 3+/5   Right Hip ADduction 3/5   Left Hip Flexion 3/5   Left Hip ABduction 2/5   Right Knee Flexion 4/5   Right Knee Extension 4/5   Left Knee Flexion 4/5   Left  Knee Extension 4/5   Right Ankle Dorsiflexion 3+/5   Left Ankle Dorsiflexion 3+/5   Ambulation/Gait   Ambulation/Gait Yes   Ambulation/Gait Assistance 4: Min assist   Ambulation/Gait Assistance Details LOB x 2 requiring mod A to correct   Ambulation Distance (Feet) 175 Feet   Assistive device None;1 person hand held assist   Gait Pattern Shuffle;Trunk flexed   Gait velocity 1.26 ft/sec  26.09 sec    Standardized Balance Assessment   Standardized Balance Assessment Berg Balance Test;Timed Up and Go Test   Berg Balance Test   Sit to Stand Able to stand without using hands and stabilize independently   Standing Unsupported Able to stand 2 minutes with supervision   Sitting with Back Unsupported but Feet Supported on Floor or Stool Able to sit safely and securely 2 minutes   Stand to Sit Controls descent by using hands   Transfers Able to transfer with verbal cueing and /or supervision   Standing Unsupported with Eyes Closed Able to stand 10 seconds with supervision   Standing Ubsupported with Feet Together Able to place feet together independently and stand for 1 minute with supervision   From Standing, Reach Forward with Outstretched Arm Reaches forward but needs supervision   From Standing Position, Pick up Object from Floor Able to pick up shoe, needs supervision   From Standing Position, Turn to Look Behind Over each Shoulder Needs supervision when turning   Turn 360 Degrees Needs close supervision or verbal cueing   Standing Unsupported, Alternately Place Feet on Step/Stool Able to complete >2 steps/needs minimal assist   Standing Unsupported, One Foot in Front Needs help to step but can hold 15 seconds   Standing on One Leg Tries to lift leg/unable to hold 3 seconds but remains standing independently  31/56   Timed Up and Go Test   Normal TUG (seconds) 46.9            PT Education - 10/17/14 1412    Education provided No          PT Short Term Goals - 10/17/14 1417     PT SHORT TERM GOAL #1   Title independent with HEP (11/14/14)   Time 4   Period Weeks   Status New   PT SHORT TERM GOAL #2   Title improve BERG balance score to >/= 36/56 to improve balance (11/14/14)   Time 4   Period Weeks   Status New   PT SHORT TERM GOAL #3   Title report pain < 7/10 with activity (11/14/14)   Time 4   Period Weeks   Status New   PT  SHORT TERM GOAL #4   Title improve timed up and go to < 37 sec with LRAD for improved mobility (11/14/14)   Time 4   Period Weeks   Status New          PT Long Term Goals - 2014-10-31 1419    PT LONG TERM GOAL #1   Title verbalize understanding of fall prevention strategies (12/12/14)   Time 8   Period Weeks   Status New   PT LONG TERM GOAL #2   Title improve BERG balance score to > 40/56 for improved mobility (12/12/14)   Time 8   Period Weeks   Status New   PT LONG TERM GOAL #3   Title improve timed up and go to < 35 sec with LRAD for improved mobility (12/12/14)   Time 8   Period Weeks   Status New   PT LONG TERM GOAL #4   Title ambulate > 300' with LRAD modified independent for improved mobility (12/12/14)   Time 8   Period Weeks   Status New   PT LONG TERM GOAL #5   Title improve gait velocity to > 1.8 ft/sec for improved mobility and decreased fall risk (12/12/14)   Time 8   Period Weeks   Status New          Plan - 2014/10/31 1413    Clinical Impression Statement Pt presents to OPPT for back pain resulting in decreased mobility, lower extremity weakness and decreased balance.  Will continue to benefit from PT to maximize functional mobility and decrease fall risk.   Pt will benefit from skilled therapeutic intervention in order to improve on the following deficits Abnormal gait;Decreased balance;Decreased activity tolerance;Pain;Impaired perceived functional ability;Decreased strength;Decreased mobility;Decreased knowledge of use of DME;Decreased knowledge of precautions;Decreased safety awareness;Improper  body mechanics;Postural dysfunction;Difficulty walking   Rehab Potential Good   PT Frequency 2x / week   PT Duration 8 weeks   PT Treatment/Interventions ADLs/Self Care Home Management;Gait training;Neuromuscular re-education;Stair training;Ultrasound;Functional mobility training;Passive range of motion;Patient/family education;Therapeutic activities;Cryotherapy;Electrical Stimulation;Therapeutic exercise;Manual techniques;Energy conservation;Balance training;DME Instruction;Moist Heat   PT Next Visit Plan gait with cane (quad v single point); gait with RW; HEP for balance (large amplitude HEP); core stability HEP   PT Home Exercise Plan to be developed next session   Consulted and Agree with Plan of Care Patient          G-Codes - 2014-10-31 1423    Functional Assessment Tool Used BERG 31/56   Functional Limitation Mobility: Walking and moving around   Mobility: Walking and Moving Around Current Status [phone]) At least 40 percent but less than 60 percent impaired, limited or restricted   Mobility: Walking and Moving Around Goal Status [phone]) At least 20 percent but less than 40 percent impaired, limited or restricted      Problem List Patient Active Problem List   Diagnosis Date Noted  . Low hemoglobin 09/22/2014  . Insomnia 07/26/2014  . Malaise and fatigue 10/04/2013  . PAC (premature atrial contraction) 10/04/2013  . Depression (emotion) 02/15/2013  . Spinal stenosis, lumbar region, with neurogenic claudication 02/04/2013  . Myasthenia gravis 03/17/2012  . Pure hypercholesterolemia 09/05/2011  . Benign hypertensive heart disease without heart failure 09/05/2011  . Osteoarthritis 09/05/2011                                       Laureen Abrahams, PT, DPT 10/31/14  2:26 PM

## 2014-10-19 DIAGNOSIS — L570 Actinic keratosis: Secondary | ICD-10-CM | POA: Diagnosis not present

## 2014-10-19 DIAGNOSIS — C44722 Squamous cell carcinoma of skin of right lower limb, including hip: Secondary | ICD-10-CM | POA: Diagnosis not present

## 2014-10-19 DIAGNOSIS — C44529 Squamous cell carcinoma of skin of other part of trunk: Secondary | ICD-10-CM | POA: Diagnosis not present

## 2014-10-19 DIAGNOSIS — C44622 Squamous cell carcinoma of skin of right upper limb, including shoulder: Secondary | ICD-10-CM | POA: Diagnosis not present

## 2014-11-01 ENCOUNTER — Encounter: Payer: Self-pay | Admitting: Physical Therapy

## 2014-11-01 ENCOUNTER — Ambulatory Visit: Payer: Medicare Other | Attending: Cardiology | Admitting: Physical Therapy

## 2014-11-01 DIAGNOSIS — R2681 Unsteadiness on feet: Secondary | ICD-10-CM

## 2014-11-01 DIAGNOSIS — R269 Unspecified abnormalities of gait and mobility: Secondary | ICD-10-CM | POA: Diagnosis not present

## 2014-11-01 DIAGNOSIS — Z5189 Encounter for other specified aftercare: Secondary | ICD-10-CM | POA: Diagnosis not present

## 2014-11-01 DIAGNOSIS — R29898 Other symptoms and signs involving the musculoskeletal system: Secondary | ICD-10-CM | POA: Diagnosis not present

## 2014-11-01 NOTE — Therapy (Signed)
Physical Therapy Treatment  Patient Details  Name: Tamara Gregory MRN: [id number] Date of Birth: 03-17-1925  Encounter Date: 11/01/2014      PT End of Session - 11/01/14 1503    Visit Number 2   Number of Visits 16   Date for PT Re-Evaluation 12/16/14   PT Start Time 5885   PT Stop Time 1445   PT Time Calculation (min) 43 min   Equipment Utilized During Treatment Gait belt   Activity Tolerance Patient tolerated treatment well   Behavior During Therapy [facility] for tasks assessed/performed      Past Medical History  Diagnosis Date  . Hypertension   . Hyperlipidemia   . Depression   . History of migraines   . Vitamin D deficiency   . GERD (gastroesophageal reflux disease)   . Headache(784.0)   . Arthritis   . Cancer     hx of skin cancer   . Macular degeneration   . Myasthenia gravis   . Thoracic aneurysm     4.4cm; see CT done 01/28/12 and 01/22/13 in [facility].   [name] Spinal stenosis     Past Surgical History  Procedure Laterality Date  . Cholecystectomy    . US echocardiography  05/18/2007    EF 55-60%  . Abdominal hysterectomy    . Tonsillectomy    . Lumbar laminectomy/decompression microdiscectomy N/A 02/04/2013    Procedure: CENTRAL DECOMPRESSION/LUMBAR LAMINECTOMY L3-L4 AND L4-L5 2 LEVELS;  Surgeon: Tobi Bastos, MD;  Location: [facility];  Service: Orthopedics;  Laterality: N/A;    There were no vitals taken for this visit.  Visit Diagnosis:  Unsteady gait  Weakness of both lower extremities  Abnormality of gait      Subjective Assessment - 11/01/14 1405    Symptoms No new complaints. Reports no pain or falls.   Currently in Pain? No/denies            [facility] Adult PT Treatment/Exercise - 11/01/14 1409    Transfers   Transfers Sit to Stand;Stand to Sit   Sit to Stand 4: Min guard;4: Min assist;With upper extremity assist;From chair/3-in-1   Sit to Stand Details (indicate cue type and reason) cues to scoot forward   Stand to Sit 4: Min guard;4: Min  assist;To chair/3-in-1;With upper extremity assist   Ambulation/Gait   Ambulation/Gait Yes   Ambulation/Gait Assistance 4: Min assist;4: Min guard   Ambulation/Gait Assistance Details pt unsteady when walking without AD: short, shuffled steps with flexed posture, veers both ways. with rollator: min guard assist with improved step lenght and BOS. cues for walker position and posture with gait.                                 Ambulation Distance (Feet) 80 Feet  x1 HHA; 238ft x1 rollator; 111ft x1 rollator   Assistive device None;Rollator   Gait Pattern Step-through pattern;Decreased stride length;Shuffle;Narrow base of support;Trunk flexed;Poor foot clearance - left;Poor foot clearance - right     Exercise: on mat in supine, issued these as HEP for core stability and strengthening Posterior pelvic tilt 5 sec hold x10 reps Bridge 5 sec hold x10 Alternating marches x10 each leg Straight leg raises x10 each leg Hip abduction/ER (clamshell) with red theraband 5 sec hold x10 reps Sit/stand with minimal UE use x10 reps      PT Education - 11/01/14 1503    Education provided Yes   Education Details HEP  Person(s) Educated Patient   Methods Explanation;Demonstration;Verbal cues;Handout   Comprehension Verbalized understanding;Need further instruction          PT Short Term Goals - 11/01/14 1508    PT SHORT TERM GOAL #1   Title independent with HEP (11/14/14)   Time 4   Period Weeks   Status On-going   PT SHORT TERM GOAL #2   Title improve BERG balance score to >/= 36/56 to improve balance (11/14/14)   Time 4   Period Weeks   Status On-going   PT SHORT TERM GOAL #3   Title report pain < 7/10 with activity (11/14/14)   Time 4   Period Weeks   Status On-going   PT SHORT TERM GOAL #4   Title improve timed up and go to < 37 sec with LRAD for improved mobility (11/14/14)   Time 4   Period Weeks   Status On-going          PT Long Term Goals - 11/01/14 1508    PT LONG TERM  GOAL #1   Title verbalize understanding of fall prevention strategies (12/12/14)   Time 8   Period Weeks   Status On-going   PT LONG TERM GOAL #2   Title improve BERG balance score to > 40/56 for improved mobility (12/12/14)   Time 8   Period Weeks   Status On-going   PT LONG TERM GOAL #3   Title improve timed up and go to < 35 sec with LRAD for improved mobility (12/12/14)   Time 8   Period Weeks   Status On-going   PT LONG TERM GOAL #4   Title ambulate > 300' with LRAD modified independent for improved mobility (12/12/14)   Time 8   Period Weeks   Status On-going   PT LONG TERM GOAL #5   Title improve gait velocity to > 1.8 ft/sec for improved mobility and decreased fall risk (12/12/14)   Time 8   Period Weeks   Status On-going          Plan - 11/01/14 1503    Clinical Impression Statement Pt and spouse educated on HEP for core strengthening and stabilization. Pt much safer with gait with use of rollator vs none today. Reports she has one at home that she usually uses in home and that she just holds onto her husbanc when out and about. Educated both that use of the rollator for now is recommended for safety.   Pt will benefit from skilled therapeutic intervention in order to improve on the following deficits Abnormal gait;Decreased balance;Decreased activity tolerance;Pain;Impaired perceived functional ability;Decreased strength;Decreased mobility;Decreased knowledge of use of DME;Decreased knowledge of precautions;Decreased safety awareness;Improper body mechanics;Postural dysfunction;Difficulty walking   Rehab Potential Good   PT Frequency 2x / week   PT Duration 8 weeks   PT Treatment/Interventions ADLs/Self Care Home Management;Gait training;Neuromuscular re-education;Stair training;Ultrasound;Functional mobility training;Passive range of motion;Patient/family education;Therapeutic activities;Cryotherapy;Electrical Stimulation;Therapeutic exercise;Manual techniques;Energy  conservation;Balance training;DME Instruction;Moist Heat   PT Next Visit Plan  HEP for balance (large amplitude HEP); continue with gait safety and balance activities.   PT Home Exercise Plan core HEP   Consulted and Agree with Plan of Care Patient;Family member/caregiver   Family Member Consulted spouse        Problem List Patient Active Problem List   Diagnosis Date Noted  . Low hemoglobin 09/22/2014  . Insomnia 07/26/2014  . Malaise and fatigue 10/04/2013  . PAC (premature atrial contraction) 10/04/2013  . Depression (emotion) 02/15/2013  .  Spinal stenosis, lumbar region, with neurogenic claudication 02/04/2013  . Myasthenia gravis 03/17/2012  . Pure hypercholesterolemia 09/05/2011  . Benign hypertensive heart disease without heart failure 09/05/2011  . Osteoarthritis 09/05/2011       Willow Ora 11/01/2014, 3:14 PM   Willow Ora, PTA, [place] [address], [place], [place] [address] [phone] 11/01/2014, 3:14 PM

## 2014-11-01 NOTE — Patient Instructions (Signed)
Flexors, Supine Bridge   Lie supine, feet shoulder-width apart. Lift hips toward ceiling. (as high as you can pain free) Hold __5_ seconds. Repeat _10 times per session. Do __1-2_ sessions per day.  Copyright  VHI. All rights reserved.  Bent Leg Lift (Hook-Lying)   Tighten stomach and slowly raise right leg __3-4__ inches from floor. Keep trunk rigid (stomach tight and back pressed down) Hold _3-5___ seconds. Alternate legs. Repeat __10 times per set. Do __1__ sets per session. Do __1-2_ sessions per day.  http://orth.exer.us/1090   Copyright  VHI. All rights reserved.  Straight Leg Raise   Tighten stomach and slowly raise locked right leg _3-4___ inches from floor. Lower slowly. Repeat with left leg. Repeat _10___ times each leg.  Do __1-2__ sessions per day.  http://orth.exer.us/1102   Copyright  VHI. All rights reserved.  Functional Quadriceps: Sit to Stand   Sit on edge of chair, feet flat on floor. Stand upright, extending knees fully. Repeat __10_ times per set. Do _1__ sets per session. Do __1-2_ sessions per day.  http://orth.exer.us/734   Copyright  VHI. All rights reserved.  Strengthening: Hip Abductor - Resisted   Lie down with band looped around both legs above knees, push thighs apart. Hold for 5 seconds. Repeat _10___ times per set. Do __1-2 times a day.  http://orth.exer.us/688   Copyright  VHI. All rights reserved.

## 2014-11-02 ENCOUNTER — Telehealth: Payer: Self-pay | Admitting: *Deleted

## 2014-11-02 NOTE — Telephone Encounter (Signed)
New Message  Left vm for pt concerning flu shot 

## 2014-11-03 ENCOUNTER — Encounter: Payer: Self-pay | Admitting: Physical Therapy

## 2014-11-03 ENCOUNTER — Ambulatory Visit: Payer: Medicare Other | Admitting: Physical Therapy

## 2014-11-03 DIAGNOSIS — Z5189 Encounter for other specified aftercare: Secondary | ICD-10-CM | POA: Diagnosis not present

## 2014-11-03 DIAGNOSIS — R29898 Other symptoms and signs involving the musculoskeletal system: Secondary | ICD-10-CM

## 2014-11-03 DIAGNOSIS — R269 Unspecified abnormalities of gait and mobility: Secondary | ICD-10-CM

## 2014-11-03 DIAGNOSIS — R2681 Unsteadiness on feet: Secondary | ICD-10-CM

## 2014-11-03 NOTE — Therapy (Signed)
[facility] [address] [place], [place], [address] Phone: [phone]   Fax:  [phone]  Physical Therapy Treatment  Patient Details  Name: Tamara Gregory MRN: [id number] Date of Birth: 05-31-1925  Encounter Date: 11/03/2014      PT End of Session - 11/03/14 1714    Visit Number 3   Number of Visits 16   Date for PT Re-Evaluation 12/16/14   PT Start Time 2992   PT Stop Time 1400   PT Time Calculation (min) 43 min   Activity Tolerance Patient tolerated treatment well      Past Medical History  Diagnosis Date  . Hypertension   . Hyperlipidemia   . Depression   . History of migraines   . Vitamin D deficiency   . GERD (gastroesophageal reflux disease)   . Headache(784.0)   . Arthritis   . Cancer     hx of skin cancer   . Macular degeneration   . Myasthenia gravis   . Thoracic aneurysm     4.4cm; see CT done 01/28/12 and 01/22/13 in [facility].   [name] Spinal stenosis     Past Surgical History  Procedure Laterality Date  . Cholecystectomy    . US echocardiography  05/18/2007    EF 55-60%  . Abdominal hysterectomy    . Tonsillectomy    . Lumbar laminectomy/decompression microdiscectomy N/A 02/04/2013    Procedure: CENTRAL DECOMPRESSION/LUMBAR LAMINECTOMY L3-L4 AND L4-L5 2 LEVELS;  Surgeon: Tobi Bastos, MD;  Location: [facility];  Service: Orthopedics;  Laterality: N/A;    There were no vitals taken for this visit.  Visit Diagnosis:  Unsteady gait  Weakness of both lower extremities  Abnormality of gait      Subjective Assessment - 11/03/14 1322    Symptoms Denies falls or changes.   Currently in Pain? No/denies            [facility] Adult PT Treatment/Exercise - 11/03/14 0001    Transfers   Transfers Sit to Stand;Stand to Sit   Sit to Stand 4: Min guard;4: Min assist;With upper extremity assist;From chair/3-in-1   Sit to Stand Details (indicate cue type and reason) cues for hand placement   Stand to Sit 4: Min  guard;4: Min assist;To chair/3-in-1;With upper extremity assist   Ambulation/Gait   Ambulation/Gait Yes   Ambulation/Gait Assistance 4: Min assist;4: Min guard   Ambulation/Gait Assistance Details needs cues to stay close to rollator   Ambulation Distance (Feet) 200 Feet  150x2   Assistive device Rollator   Gait Pattern Step-through pattern;Decreased stride length;Narrow base of support   Knee/Hip Exercises: Aerobic   Stationary Bike x 5 minutes on foot pedaler at edge of mat           Plan - 11/03/14 1714    Clinical Impression Statement Pt educated on safety and technique with Rollator.  Pt very pleasant and appears motivated to improve mobility.   Pt will benefit from skilled therapeutic intervention in order to improve on the following deficits Abnormal gait;Decreased balance;Decreased activity tolerance;Pain;Impaired perceived functional ability;Decreased strength;Decreased mobility;Decreased knowledge of use of DME;Decreased knowledge of precautions;Decreased safety awareness;Improper body mechanics;Postural dysfunction;Difficulty walking   Rehab Potential Good   PT Frequency 2x / week   PT Duration 8 weeks   PT Treatment/Interventions ADLs/Self Care Home Management;Gait training;Neuromuscular re-education;Stair training;Ultrasound;Functional mobility training;Passive range of motion;Patient/family education;Therapeutic activities;Cryotherapy;Electrical Stimulation;Therapeutic exercise;Manual techniques;Energy conservation;Balance training;DME Instruction;Moist Heat   PT Next Visit Plan Continue gait, balance, strengthening   Consulted and  Agree with Plan of Care Patient              Balance Exercises - 11/03/14 1713    Balance Exercises: Standing   Other Standing Exercises standing at counter for forward step and backward step weight shifts       Problem List Patient Active Problem List   Diagnosis Date Noted  . Low hemoglobin 09/22/2014  . Insomnia 07/26/2014  .  Malaise and fatigue 10/04/2013  . PAC (premature atrial contraction) 10/04/2013  . Depression (emotion) 02/15/2013  . Spinal stenosis, lumbar region, with neurogenic claudication 02/04/2013  . Myasthenia gravis 03/17/2012  . Pure hypercholesterolemia 09/05/2011  . Benign hypertensive heart disease without heart failure 09/05/2011  . Osteoarthritis 09/05/2011    Narda Bonds 11/03/2014, 5:17 PM     Narda Bonds, PTA [place] 11/03/2014 5:17 PM Phone: [phone] Fax: [phone]

## 2014-11-07 ENCOUNTER — Ambulatory Visit: Payer: Medicare Other | Admitting: Physical Therapy

## 2014-11-07 DIAGNOSIS — M961 Postlaminectomy syndrome, not elsewhere classified: Secondary | ICD-10-CM | POA: Diagnosis not present

## 2014-11-07 DIAGNOSIS — M79605 Pain in left leg: Secondary | ICD-10-CM | POA: Diagnosis not present

## 2014-11-07 DIAGNOSIS — M545 Low back pain: Secondary | ICD-10-CM | POA: Diagnosis not present

## 2014-11-07 DIAGNOSIS — M79604 Pain in right leg: Secondary | ICD-10-CM | POA: Diagnosis not present

## 2014-11-07 DIAGNOSIS — G8929 Other chronic pain: Secondary | ICD-10-CM | POA: Diagnosis not present

## 2014-11-07 DIAGNOSIS — Z79899 Other long term (current) drug therapy: Secondary | ICD-10-CM | POA: Diagnosis not present

## 2014-11-08 ENCOUNTER — Ambulatory Visit: Payer: Medicare Other | Admitting: Physical Therapy

## 2014-11-09 ENCOUNTER — Ambulatory Visit: Payer: Medicare Other | Admitting: Physical Therapy

## 2014-11-10 ENCOUNTER — Ambulatory Visit: Payer: Medicare Other | Admitting: Rehabilitative and Restorative Service Providers"

## 2014-11-10 ENCOUNTER — Encounter: Payer: Self-pay | Admitting: Rehabilitative and Restorative Service Providers"

## 2014-11-10 DIAGNOSIS — R269 Unspecified abnormalities of gait and mobility: Secondary | ICD-10-CM

## 2014-11-10 DIAGNOSIS — R29898 Other symptoms and signs involving the musculoskeletal system: Secondary | ICD-10-CM

## 2014-11-10 DIAGNOSIS — G7000 Myasthenia gravis without (acute) exacerbation: Secondary | ICD-10-CM | POA: Diagnosis not present

## 2014-11-10 DIAGNOSIS — Z5189 Encounter for other specified aftercare: Secondary | ICD-10-CM | POA: Diagnosis not present

## 2014-11-10 NOTE — Therapy (Signed)
[facility] [address] [place], [place], [address] Phone: [phone]   Fax:  [phone]  Physical Therapy Treatment  Patient Details  Name: Tamara Gregory MRN: [id number] Date of Birth: 1925/07/20  Encounter Date: 11/10/2014      PT End of Session - 11/10/14 1212    Visit Number 4  G code (4)   Number of Visits 16   Date for PT Re-Evaluation 12/16/14   PT Start Time 0850   PT Stop Time 0935   PT Time Calculation (min) 45 min   Equipment Utilized During Treatment Gait belt   Activity Tolerance Patient tolerated treatment well   Behavior During Therapy [facility] for tasks assessed/performed      Past Medical History  Diagnosis Date  . Hypertension   . Hyperlipidemia   . Depression   . History of migraines   . Vitamin D deficiency   . GERD (gastroesophageal reflux disease)   . Headache(784.0)   . Arthritis   . Cancer     hx of skin cancer   . Macular degeneration   . Myasthenia gravis   . Thoracic aneurysm     4.4cm; see CT done 01/28/12 and 01/22/13 in [facility].   [name] Spinal stenosis     Past Surgical History  Procedure Laterality Date  . Cholecystectomy    . US echocardiography  05/18/2007    EF 55-60%  . Abdominal hysterectomy    . Tonsillectomy    . Lumbar laminectomy/decompression microdiscectomy N/A 02/04/2013    Procedure: CENTRAL DECOMPRESSION/LUMBAR LAMINECTOMY L3-L4 AND L4-L5 2 LEVELS;  Surgeon: Tobi Bastos, MD;  Location: [facility];  Service: Orthopedics;  Laterality: N/A;    There were no vitals taken for this visit.  Visit Diagnosis:  Abnormality of gait  Weakness of both lower extremities      Subjective Assessment - 11/10/14 0854    Symptoms The patient reports she did not bring her walker today, but she usually uses it.  The patient reports she is doing HEP occasionally.     Currently in Pain? Yes   Pain Score 5    Pain Location Back   Pain Orientation Lower   Pain Descriptors / Indicators  Aching   Pain Onset More than a month ago   Pain Relieving Factors took excedrin and feels this helped this morning.     NEUROMUSCULAR RE-EDUCATION: Wall bumps x 10 reps with eyes open and then 5 reps with eyes closed with cues for balance and posture and CGA for safety. Standing 1/2 tandem foot position reaching hands up/down wall for postural upright/elongation. Standing alternating LE foot movements into/out of stride position.  THERAPEUTIC EXERCISE: Seated long arc quads without weights x 10 reps. Standing sidestepping for hip abduction strengthening x 10 feet x 4 repetitions. Sit<>stand with emphasis on tall posture upon rising and scapular retraction with UE position.  Gait: The patient ambulates with min A without a device x 100 ft with cues upright posture and bilateral heel strike.  Patient ambulated with RW x 250 ft with close supervision with cues for upright posture and heel strike x 2 reps.  Problem List Patient Active Problem List   Diagnosis Date Noted  . Low hemoglobin 09/22/2014  . Insomnia 07/26/2014  . Malaise and fatigue 10/04/2013  . PAC (premature atrial contraction) 10/04/2013  . Depression (emotion) 02/15/2013  . Spinal stenosis, lumbar region, with neurogenic claudication 02/04/2013  . Myasthenia gravis 03/17/2012  . Pure hypercholesterolemia 09/05/2011  . Benign  hypertensive heart disease without heart failure 09/05/2011  . Osteoarthritis 09/05/2011   Rudell Cobb, PT, MPT 11/10/2014 1:38 PM [facility] Phone: [phone] Fax: [phone]  [place] 11/10/2014, 1:38 PM

## 2014-11-11 ENCOUNTER — Ambulatory Visit: Payer: Medicare Other | Admitting: Physical Therapy

## 2014-11-11 ENCOUNTER — Encounter: Payer: Self-pay | Admitting: Physical Therapy

## 2014-11-11 DIAGNOSIS — R269 Unspecified abnormalities of gait and mobility: Secondary | ICD-10-CM

## 2014-11-11 DIAGNOSIS — Z5189 Encounter for other specified aftercare: Secondary | ICD-10-CM | POA: Diagnosis not present

## 2014-11-11 DIAGNOSIS — R2681 Unsteadiness on feet: Secondary | ICD-10-CM

## 2014-11-11 DIAGNOSIS — R29898 Other symptoms and signs involving the musculoskeletal system: Secondary | ICD-10-CM

## 2014-11-11 NOTE — Therapy (Signed)
[facility] [address] [place], [place], [address] Phone: [phone]   Fax:  [phone]  Physical Therapy Treatment  Patient Details  Name: PHYLLICIA DUDEK MRN: [id number] Date of Birth: 1925-05-31  Encounter Date: 11/11/2014      PT End of Session - 11/11/14 1205    Visit Number 5  G5   Number of Visits 16   Date for PT Re-Evaluation 12/16/14   PT Start Time 1020   PT Stop Time 1100   PT Time Calculation (min) 40 min   Equipment Utilized During Treatment Gait belt   Activity Tolerance Patient tolerated treatment well      Past Medical History  Diagnosis Date  . Hypertension   . Hyperlipidemia   . Depression   . History of migraines   . Vitamin D deficiency   . GERD (gastroesophageal reflux disease)   . Headache(784.0)   . Arthritis   . Cancer     hx of skin cancer   . Macular degeneration   . Myasthenia gravis   . Thoracic aneurysm     4.4cm; see CT done 01/28/12 and 01/22/13 in [facility].   [name] Spinal stenosis     Past Surgical History  Procedure Laterality Date  . Cholecystectomy    . US echocardiography  05/18/2007    EF 55-60%  . Abdominal hysterectomy    . Tonsillectomy    . Lumbar laminectomy/decompression microdiscectomy N/A 02/04/2013    Procedure: CENTRAL DECOMPRESSION/LUMBAR LAMINECTOMY L3-L4 AND L4-L5 2 LEVELS;  Surgeon: Tobi Bastos, MD;  Location: [facility];  Service: Orthopedics;  Laterality: N/A;    There were no vitals taken for this visit.  Visit Diagnosis:  Abnormality of gait  Weakness of both lower extremities  Unsteady gait      Subjective Assessment - 11/11/14 1025    Symptoms Patient reports that she is feeling worse than she has in a really long time.  Pt reports she is "just exhausted."   Currently in Pain? No/denies            [facility] Adult PT Treatment/Exercise - 11/11/14 1027    Ambulation/Gait   Ambulation/Gait Yes   Ambulation/Gait Assistance 4: Min guard   Ambulation/Gait Assistance Details postural cues to avoid forward lean onto rollator walker   Ambulation Distance (Feet) 230 Feet  ft, then 230 ft., then 120 ft.   Assistive device Rollator   Gait Pattern Step-through pattern;Decreased stride length;Narrow base of support  forward flexed posture    Adjusted height of rollator walker, by lowering one notch, to promote more relaxed upper extremities and upright posture.  Therapeutic Exercise: Seated long arc quads with no weight, 2 sets x 10 reps; seated hip flexion (marching) 2 sets x 10 reps; seated resisted knee flexion with red theraband 2 sets x 10 reps; seated ankle pumps 2 sets x 10 reps.  Sit <> stand from 18 inch surface x 10 reps using UE support, then from 24 inch surface x 10 reps with no UE support, for lower extremity strengthening.  Pt requires cues for upright posture upon standing.      PT Education - 11/10/14 1211    Education provided Yes   Education Details Recommended bringing rollater RW to PT sessions   Person(s) Educated Patient   Methods Explanation   Comprehension Verbalized understanding          PT Short Term Goals - 11/11/14 1207    PT SHORT TERM GOAL #1   Title  independent with HEP (11/14/14)   Status On-going   PT SHORT TERM GOAL #2   Title improve BERG balance score to >/= 36/56 to improve balance (11/14/14)   Status On-going   PT SHORT TERM GOAL #3   Title report pain < 7/10 with activity (11/14/14)   Status On-going   PT SHORT TERM GOAL #4   Title improve timed up and go to < 37 sec with LRAD for improved mobility (11/14/14)   Status On-going          PT Long Term Goals - 11/11/14 1207    PT LONG TERM GOAL #1   Title verbalize understanding of fall prevention strategies (12/12/14)   Status On-going   PT LONG TERM GOAL #2   Title improve BERG balance score to > 40/56 for improved mobility (12/12/14)   Status On-going   PT LONG TERM GOAL #3   Title improve timed up and go to < 35 sec with  LRAD for improved mobility (12/12/14)   Status On-going   PT LONG TERM GOAL #4   Title ambulate > 300' with LRAD modified independent for improved mobility (12/12/14)   Status On-going   PT LONG TERM GOAL #5   Title improve gait velocity to > 1.8 ft/sec for improved mobility and decreased fall risk (12/12/14)   Status On-going          Plan - 11/11/14 1206    Clinical Impression Statement Pt did not walk in with rollator walker today, but husband brought in walker at beginning of session.  Reiterated recommendation to continue using rollator walker for optimal safety with gait.   Pt will benefit from skilled therapeutic intervention in order to improve on the following deficits Abnormal gait;Decreased balance;Decreased activity tolerance;Pain;Impaired perceived functional ability;Decreased strength;Decreased mobility;Decreased knowledge of use of DME;Decreased knowledge of precautions;Decreased safety awareness;Improper body mechanics;Postural dysfunction;Difficulty walking   PT Treatment/Interventions ADLs/Self Care Home Management;Gait training;Neuromuscular re-education;Stair training;Ultrasound;Functional mobility training;Passive range of motion;Patient/family education;Therapeutic activities;Cryotherapy;Electrical Stimulation;Therapeutic exercise;Manual techniques;Energy conservation;Balance training;DME Instruction;Moist Heat   PT Next Visit Plan Continue gait, balance, strengthening; check STGs next visit   Consulted and Agree with Plan of Care Patient                               Problem List Patient Active Problem List   Diagnosis Date Noted  . Low hemoglobin 09/22/2014  . Insomnia 07/26/2014  . Malaise and fatigue 10/04/2013  . PAC (premature atrial contraction) 10/04/2013  . Depression (emotion) 02/15/2013  . Spinal stenosis, lumbar region, with neurogenic claudication 02/04/2013  . Myasthenia gravis 03/17/2012  . Pure hypercholesterolemia 09/05/2011   . Benign hypertensive heart disease without heart failure 09/05/2011  . Osteoarthritis 09/05/2011    MARRIOTT,AMY W. 11/11/2014, 12:12 PM   Mady Haagensen, PT 11/11/2014 12:12 PM Phone: [phone] Fax: [phone]

## 2014-11-14 ENCOUNTER — Ambulatory Visit: Payer: Medicare Other | Admitting: Physical Therapy

## 2014-11-14 ENCOUNTER — Encounter: Payer: Self-pay | Admitting: Physical Therapy

## 2014-11-14 DIAGNOSIS — R2681 Unsteadiness on feet: Secondary | ICD-10-CM

## 2014-11-14 DIAGNOSIS — Z5189 Encounter for other specified aftercare: Secondary | ICD-10-CM | POA: Diagnosis not present

## 2014-11-14 DIAGNOSIS — R29898 Other symptoms and signs involving the musculoskeletal system: Secondary | ICD-10-CM

## 2014-11-14 DIAGNOSIS — R269 Unspecified abnormalities of gait and mobility: Secondary | ICD-10-CM

## 2014-11-14 NOTE — Patient Instructions (Signed)
Feet Heel-Toe "Tandem", Varied Arm Positions - Eyes Open   With eyes open, right foot directly in front of the other, arms over the counter, look straight ahead at a stationary object. Hold __15__ seconds. Repeat __3__ times per leg, per session. Do _2___ sessions per day.  Copyright  VHI. All rights reserved.    Single Leg - Eyes Open   Holding support, lift right leg while maintaining balance over other leg. Progress to removing hands from support surface for longer periods of time. Hold__10__ seconds. Repeat __3__ times per leg, per session. Do _2___ sessions per day.  Copyright  VHI. All rights reserved.    Marching in Place: Varied Surfaces   March in place, slowly lifting knees toward ceiling. Repeat _10__ times per session. Do __2__ sessions per day.   Copyright  VHI. All rights reserved.

## 2014-11-14 NOTE — Therapy (Signed)
[facility] [address] [place], [place], [address] Phone: [phone]   Fax:  [phone]  Physical Therapy Treatment  Patient Details  Name: Tamara Gregory MRN: [id number] Date of Birth: 04-06-1925  Encounter Date: 11/14/2014      PT End of Session - 11/14/14 1655    Visit Number 6  G6   Number of Visits 16   Date for PT Re-Evaluation 12/16/14   PT Start Time 1410  Pt arrived late   PT Stop Time 1452   PT Time Calculation (min) 42 min   Activity Tolerance Patient tolerated treatment well      Past Medical History  Diagnosis Date  . Hypertension   . Hyperlipidemia   . Depression   . History of migraines   . Vitamin D deficiency   . GERD (gastroesophageal reflux disease)   . Headache(784.0)   . Arthritis   . Cancer     hx of skin cancer   . Macular degeneration   . Myasthenia gravis   . Thoracic aneurysm     4.4cm; see CT done 01/28/12 and 01/22/13 in [facility].   [name] Spinal stenosis     Past Surgical History  Procedure Laterality Date  . Cholecystectomy    . US echocardiography  05/18/2007    EF 55-60%  . Abdominal hysterectomy    . Tonsillectomy    . Lumbar laminectomy/decompression microdiscectomy N/A 02/04/2013    Procedure: CENTRAL DECOMPRESSION/LUMBAR LAMINECTOMY L3-L4 AND L4-L5 2 LEVELS;  Surgeon: Tobi Bastos, MD;  Location: [facility];  Service: Orthopedics;  Laterality: N/A;    There were no vitals taken for this visit.  Visit Diagnosis:  Abnormality of gait  Weakness of both lower extremities  Unsteady gait      Subjective Assessment - 11/14/14 1411    Symptoms Had a terrible headache yesterday.  Took extra strength pain reliever and it went away.   Currently in Pain? No/denies          [facility] PT Assessment - 11/14/14 1419    Ambulation/Gait   Ambulation/Gait Assistance 5: Supervision   Ambulation/Gait Assistance Details postural cues for upright posture/ to avoid forward lean onto rollator   Assistive device Rollator   Gait Pattern Step-through pattern;Decreased stride length;Narrow base of support          [facility] Adult PT Treatment/Exercise - 11/14/14 1419    Ambulation/Gait   Ambulation/Gait Yes   Ambulation Distance (Feet) 230 Feet  then 100 ft x 2   Standardized Balance Assessment   Standardized Balance Assessment Timed Up and Go Test;Berg Balance Test   Berg Balance Test   Sit to Stand Able to stand without using hands and stabilize independently   Standing Unsupported Able to stand 2 minutes with supervision   Sitting with Back Unsupported but Feet Supported on Floor or Stool Able to sit 2 minutes under supervision   Stand to Sit Sits safely with minimal use of hands   Transfers Able to transfer safely, minor use of hands   Standing Unsupported with Eyes Closed Able to stand 10 seconds with supervision   Standing Ubsupported with Feet Together Able to place feet together independently and stand for 1 minute with supervision   From Standing, Reach Forward with Outstretched Arm Can reach forward >12 cm safely (5")   From Standing Position, Pick up Object from Floor Able to pick up shoe, needs supervision   From Standing Position, Turn to Look Behind Over each Shoulder Needs supervision  when turning   Turn 360 Degrees Able to turn 360 degrees safely but slowly   Standing Unsupported, Alternately Place Feet on Step/Stool Needs assistance to keep from falling or unable to try   Standing Unsupported, One Foot in [place] to take small step independently and hold 30 seconds   Standing on One Leg Unable to try or needs assist to prevent fall   Total Score 35   Timed Up and Go Test   TUG Normal TUG   Normal TUG (seconds) 32.86  31.6 sec on 2nd trial; rollator walker for both   High Level Balance   High Level Balance Activities --  Marching x 10, side kicks, back kicks   High Level Balance Comments x 10 reps at counter for improved single limb stance   Exercises    Exercises Balance   Balance Exercises   SLS Eyes open;Solid surface;2 reps;10 secs  At counter   Tandem Stance Eyes open;2 reps;10 secs  At counter           PT Education - 11/14/14 1655    Education provided Yes   Education Details HEP for balance activities-single limb stance, tandem stance, marching   Person(s) Educated Patient   Methods Explanation;Demonstration;Handout   Comprehension Verbalized understanding;Returned demonstration          PT Short Term Goals - 11/14/14 1659    PT SHORT TERM GOAL #1   Title independent with HEP (11/14/14)   Status On-going   PT SHORT TERM GOAL #2   Title improve BERG balance score to >/= 36/56 to improve balance (11/14/14)   Status Not Met  Berg score 35/56, improved from 31/56   PT SHORT TERM GOAL #3   Title report pain < 7/10 with activity (11/14/14)   Status Not Met  Rates pain 5-8/10 with functional activities   PT SHORT TERM GOAL #4   Title improve timed up and go to < 37 sec with LRAD for improved mobility (11/14/14)   Status Achieved            Plan - 11/14/14 1656    Clinical Impression Statement Pt arrived to treatment session late today, but did bring/was using rollator walker today.  Began checking STGs:  Pt has not met STG #2 or #3.  Pt has met STG#4.  Pt will continue to benefit from further skilled PT to further address gait, balance, and strength.  Pt has continued concerns over balance and negotiating steps and curbs, but she does report that she feels like she is improving.   Pt will benefit from skilled therapeutic intervention in order to improve on the following deficits Abnormal gait;Decreased balance;Decreased activity tolerance;Pain;Impaired perceived functional ability;Decreased strength;Decreased mobility;Decreased knowledge of use of DME;Decreased knowledge of precautions;Decreased safety awareness;Improper body mechanics;Postural dysfunction;Difficulty walking   Rehab Potential Good   PT Frequency 2x /  week   PT Duration 8 weeks   PT Treatment/Interventions ADLs/Self Care Home Management;Gait training;Neuromuscular re-education;Stair training;Ultrasound;Functional mobility training;Passive range of motion;Patient/family education;Therapeutic activities;Cryotherapy;Electrical Stimulation;Therapeutic exercise;Manual techniques;Energy conservation;Balance training;DME Instruction;Moist Heat   PT Next Visit Plan Check/review HEP, step-ups/step downs at step, continue single limb standing activities                               Problem List Patient Active Problem List   Diagnosis Date Noted  . Low hemoglobin 09/22/2014  . Insomnia 07/26/2014  . Malaise and fatigue 10/04/2013  . PAC (premature  atrial contraction) 10/04/2013  . Depression (emotion) 02/15/2013  . Spinal stenosis, lumbar region, with neurogenic claudication 02/04/2013  . Myasthenia gravis 03/17/2012  . Pure hypercholesterolemia 09/05/2011  . Benign hypertensive heart disease without heart failure 09/05/2011  . Osteoarthritis 09/05/2011    Marcques Wrightsman W. 11/14/2014, 5:04 PM     Bartt Gonzaga Gerrit Friends, PT 11/14/2014 5:05 PM Phone: [phone] Fax: [phone]

## 2014-11-16 ENCOUNTER — Ambulatory Visit: Payer: Medicare Other | Admitting: Physical Therapy

## 2014-11-16 ENCOUNTER — Encounter: Payer: Self-pay | Admitting: Physical Therapy

## 2014-11-16 DIAGNOSIS — Z5189 Encounter for other specified aftercare: Secondary | ICD-10-CM | POA: Diagnosis not present

## 2014-11-16 DIAGNOSIS — R2681 Unsteadiness on feet: Secondary | ICD-10-CM

## 2014-11-16 DIAGNOSIS — R29898 Other symptoms and signs involving the musculoskeletal system: Secondary | ICD-10-CM

## 2014-11-16 DIAGNOSIS — R269 Unspecified abnormalities of gait and mobility: Secondary | ICD-10-CM

## 2014-11-17 NOTE — Therapy (Signed)
[facility] [address] [place], [place], [address] Phone: [phone]   Fax:  [phone]  Physical Therapy Treatment  Patient Details  Name: Tamara Gregory MRN: [id number] Date of Birth: [birth year]  Encounter Date: 11/16/2014      PT End of Session - 11/17/14 0903    Visit Number 7   Number of Visits 16   Date for PT Re-Evaluation 12/16/14   PT Start Time 1103   PT Stop Time 1146   PT Time Calculation (min) 43 min   Equipment Utilized During Treatment Gait belt   Activity Tolerance Patient tolerated treatment well   Behavior During Therapy [facility] for tasks assessed/performed      Past Medical History  Diagnosis Date  . Hypertension   . Hyperlipidemia   . Depression   . History of migraines   . Vitamin D deficiency   . GERD (gastroesophageal reflux disease)   . Headache(784.0)   . Arthritis   . Cancer     hx of skin cancer   . Macular degeneration   . Myasthenia gravis   . Thoracic aneurysm     4.4cm; see CT done 01/28/12 and 01/22/13 in [facility].   [name] Spinal stenosis     Past Surgical History  Procedure Laterality Date  . Cholecystectomy    . US echocardiography  05/18/2007    EF 55-60%  . Abdominal hysterectomy    . Tonsillectomy    . Lumbar laminectomy/decompression microdiscectomy N/A 02/04/2013    Procedure: CENTRAL DECOMPRESSION/LUMBAR LAMINECTOMY L3-L4 AND L4-L5 2 LEVELS;  Surgeon: Tobi Bastos, MD;  Location: [facility];  Service: Orthopedics;  Laterality: N/A;    There were no vitals taken for this visit.  Visit Diagnosis:  Abnormality of gait  Weakness of both lower extremities  Unsteady gait      Subjective Assessment - 11/16/14 1108    Symptoms "I feel terrible.  It's hard to explain.  My left hip bothered me a lot last night."  Denies pain at present.   Currently in Pain? No/denies            [facility] Adult PT Treatment/Exercise - 11/16/14 1600    Transfers   Transfers Sit to Stand;Stand to  Sit   Sit to Stand 5: Supervision;With upper extremity assist;From chair/3-in-1;Other/comment  cues to control descent   Sit to Stand Details (indicate cue type and reason) repeated x 10 at mat   Stand to Sit 5: Supervision   Ambulation/Gait   Ambulation/Gait Yes   Ambulation/Gait Assistance 5: Supervision   Ambulation/Gait Assistance Details cues to stay close to rollator   Ambulation Distance (Feet) 200 Feet  twice   Assistive device Rollator   Gait Pattern Step-through pattern;Decreased stride length;Narrow base of support   Curb 4: Min assist  with rollator x 2 trials   Knee/Hip Exercises: Standing   Knee Flexion AROM;Both;1 set;10 reps   Forward Step Up Both;1 set;15 reps;Hand Hold: 2;Hand Hold: 1;Step Height: 4";Step Height: 6"   Step Down Both;1 set;15 reps;Hand Hold: 1;Step Height: 6";Step Height: 4"   SLS x 3 x 20 seconds   Other Standing Knee Exercises standing heel to toe x 3 x 20 seconds at counter   Knee/Hip Exercises: Supine   Bridges AROM;Both;1 set;10 reps   Straight Leg Raises AROM;Strengthening;Both;1 set;10 reps   Other Supine Knee Exercises marching in hooklying x 10, hip abd with red theraband in hooklying x 10  PT Short Term Goals - 11/17/14 0906    PT SHORT TERM GOAL #1   Title independent with HEP (11/14/14)   Time 4   Period Weeks   Status Achieved            Plan - 11/17/14 0904    Clinical Impression Statement Pt met STG #1.  Continues to have difficulty with SLS.  Continue PT per POC.   Pt will benefit from skilled therapeutic intervention in order to improve on the following deficits Abnormal gait;Decreased balance;Decreased activity tolerance;Pain;Impaired perceived functional ability;Decreased strength;Decreased mobility;Decreased knowledge of use of DME;Decreased knowledge of precautions;Decreased safety awareness;Improper body mechanics;Postural dysfunction;Difficulty walking   Rehab Potential Good   PT Frequency 2x / week    PT Duration 8 weeks   PT Treatment/Interventions ADLs/Self Care Home Management;Gait training;Neuromuscular re-education;Stair training;Ultrasound;Functional mobility training;Passive range of motion;Patient/family education;Therapeutic activities;Cryotherapy;Electrical Stimulation;Therapeutic exercise;Manual techniques;Energy conservation;Balance training;DME Instruction;Moist Heat   PT Next Visit Plan Continue step ups/down at 4" and 6" and curb.  Slingle limb activities with decreasing UE support.   Consulted and Agree with Plan of Care Patient        Problem List Patient Active Problem List   Diagnosis Date Noted  . Low hemoglobin 09/22/2014  . Insomnia 07/26/2014  . Malaise and fatigue 10/04/2013  . PAC (premature atrial contraction) 10/04/2013  . Depression (emotion) 02/15/2013  . Spinal stenosis, lumbar region, with neurogenic claudication 02/04/2013  . Myasthenia gravis 03/17/2012  . Pure hypercholesterolemia 09/05/2011  . Benign hypertensive heart disease without heart failure 09/05/2011  . Osteoarthritis 09/05/2011    Narda Bonds 11/17/2014, 9:08 AM     Narda Bonds, PTA [place] 11/17/2014 9:09 AM Phone: [phone] Fax: [phone]

## 2014-11-21 ENCOUNTER — Encounter: Payer: Self-pay | Admitting: Physical Therapy

## 2014-11-21 ENCOUNTER — Ambulatory Visit: Payer: Medicare Other | Admitting: Physical Therapy

## 2014-11-21 DIAGNOSIS — Z5189 Encounter for other specified aftercare: Secondary | ICD-10-CM | POA: Diagnosis not present

## 2014-11-21 DIAGNOSIS — R2681 Unsteadiness on feet: Secondary | ICD-10-CM

## 2014-11-21 DIAGNOSIS — R29898 Other symptoms and signs involving the musculoskeletal system: Secondary | ICD-10-CM

## 2014-11-21 DIAGNOSIS — R269 Unspecified abnormalities of gait and mobility: Secondary | ICD-10-CM

## 2014-11-21 NOTE — Therapy (Signed)
[facility] [address] [place], [place], [address] Phone: [phone]   Fax:  [phone]  Physical Therapy Treatment  Patient Details  Name: Tamara Gregory MRN: [id number] Date of Birth: 10/26/1925  Encounter Date: 11/21/2014      PT End of Session - 11/23/14 0834    Visit Number 8   Number of Visits 16   Date for PT Re-Evaluation 12/16/14   PT Start Time 1406   PT Stop Time 1446   PT Time Calculation (min) 40 min   Activity Tolerance Patient tolerated treatment well  Pt reports fatigue and requests freqent rest breaks   Behavior During Therapy [facility] for tasks assessed/performed      Past Medical History  Diagnosis Date  . Hypertension   . Hyperlipidemia   . Depression   . History of migraines   . Vitamin D deficiency   . GERD (gastroesophageal reflux disease)   . Headache(784.0)   . Arthritis   . Cancer     hx of skin cancer   . Macular degeneration   . Myasthenia gravis   . Thoracic aneurysm     4.4cm; see CT done 01/28/12 and 01/22/13 in [facility].   [name] Spinal stenosis     Past Surgical History  Procedure Laterality Date  . Cholecystectomy    . US echocardiography  05/18/2007    EF 55-60%  . Abdominal hysterectomy    . Tonsillectomy    . Lumbar laminectomy/decompression microdiscectomy N/A 02/04/2013    Procedure: CENTRAL DECOMPRESSION/LUMBAR LAMINECTOMY L3-L4 AND L4-L5 2 LEVELS;  Surgeon: Tobi Bastos, MD;  Location: [facility];  Service: Orthopedics;  Laterality: N/A;    There were no vitals taken for this visit.  Visit Diagnosis:  Abnormality of gait  Weakness of both lower extremities  Unsteady gait   OBJECTIVE: TherEx:  Leg strengthening with sit<>stand:  5 reps from 20 inch mat surface, then 5 reps from 18 inch chair surface, 2 sets each.  Forward step ups 2 sets x 10 reps at 6 inch step with bilateral UE support, for improved lower extremity strength.  NuStep, Level 3, 4 extremities x 5  minutes for leg strengthening.  Gait:  Throughout session, with short distance gait (100 ft x 2), then 50 ft. X 4 reps, pt needs cues and supervision to stay within walker's BOS.  Pt needs cues for turning to sit with rollator walker.  Neuro:  Alternating Step taps at 6 inch step, 10 reps x 2 sets with UE support, with cues for increased foot clearance, for improved single limb stance.  At counter, postural exercise with push up to scapular squeezes at counter x 10 reps; forward/back walking, forward/back marching 3 sets x 10 reps.          PT Short Term Goals - 11/17/14 0906    PT SHORT TERM GOAL #1   Title independent with HEP (11/14/14)   Time 4   Period Weeks   Status Achieved           PT Long Term Goals - 11/11/14 1207    PT LONG TERM GOAL #1   Title verbalize understanding of fall prevention strategies (12/12/14)   Status On-going   PT LONG TERM GOAL #2   Title improve BERG balance score to > 40/56 for improved mobility (12/12/14)   Status On-going   PT LONG TERM GOAL #3   Title improve timed up and go to < 35 sec with LRAD for  improved mobility (12/12/14)   Status On-going   PT LONG TERM GOAL #4   Title ambulate > 300' with LRAD modified independent for improved mobility (12/12/14)   Status On-going   PT LONG TERM GOAL #5   Title improve gait velocity to > 1.8 ft/sec for improved mobility and decreased fall risk (12/12/14)   Status On-going               Plan - 11/23/14 0835    Clinical Impression Statement Despite fatigue, pt performs strengthening and balance activities without difficulty.  Pt will continue to benefit from further skilled PT to address strength, balance, and gait.   Pt will benefit from skilled therapeutic intervention in order to improve on the following deficits Abnormal gait;Decreased balance;Decreased activity tolerance;Pain;Impaired perceived functional ability;Decreased strength;Decreased mobility;Decreased knowledge of use of  DME;Decreased knowledge of precautions;Decreased safety awareness;Improper body mechanics;Postural dysfunction;Difficulty walking   Rehab Potential Good   PT Frequency 2x / week   PT Duration 8 weeks   PT Treatment/Interventions ADLs/Self Care Home Management;Gait training;Neuromuscular re-education;Stair training;Ultrasound;Functional mobility training;Passive range of motion;Patient/family education;Therapeutic activities;Cryotherapy;Electrical Stimulation;Therapeutic exercise;Manual techniques;Energy conservation;Balance training;DME Instruction;Moist Heat   PT Next Visit Plan Continue step ups for strengthening, single limb stance activities   Consulted and Agree with Plan of Care Patient        Problem List Patient Active Problem List   Diagnosis Date Noted  . Low hemoglobin 09/22/2014  . Insomnia 07/26/2014  . Malaise and fatigue 10/04/2013  . PAC (premature atrial contraction) 10/04/2013  . Depression (emotion) 02/15/2013  . Spinal stenosis, lumbar region, with neurogenic claudication 02/04/2013  . Myasthenia gravis 03/17/2012  . Pure hypercholesterolemia 09/05/2011  . Benign hypertensive heart disease without heart failure 09/05/2011  . Osteoarthritis 09/05/2011    Rhianon Zabawa W. 11/23/2014, 8:40 AM   Mady Haagensen, PT 11/23/2014 8:48 AM Phone: [phone] Fax: [place] [address] [place], [place], [address] Phone: [phone]   Fax:  [phone]

## 2014-11-23 ENCOUNTER — Ambulatory Visit: Payer: Medicare Other | Admitting: Physical Therapy

## 2014-11-24 ENCOUNTER — Telehealth: Payer: Self-pay | Admitting: Cardiology

## 2014-11-24 NOTE — Telephone Encounter (Signed)
Advised urgent Care for patient.

## 2014-11-24 NOTE — Telephone Encounter (Signed)
Husband calls stating patient has blood in her urine. They think it may be a UTI and want an antibiotic. No fever chills. Just some dysuria. i did advise an Urgent Care, but they requested I try to get in touch with you. Since it is Christmas Eve, I will advise one way or another before we close.

## 2014-11-24 NOTE — Telephone Encounter (Signed)
New message      Pt has blood in her urine.  She is not on a blood thinner.  It is probably a UTI and they want a presc

## 2014-11-27 NOTE — Telephone Encounter (Signed)
Agree with advice given

## 2014-11-30 ENCOUNTER — Emergency Department (INDEPENDENT_AMBULATORY_CARE_PROVIDER_SITE_OTHER)
Admission: EM | Admit: 2014-11-30 | Discharge: 2014-11-30 | Disposition: A | Discharge status: Home / Self Care | Payer: Medicare Other | Source: Home / Self Care | Attending: Family Medicine | Admitting: Family Medicine

## 2014-11-30 ENCOUNTER — Telehealth: Payer: Self-pay | Admitting: Cardiology

## 2014-11-30 ENCOUNTER — Encounter (HOSPITAL_COMMUNITY): Payer: Self-pay

## 2014-11-30 DIAGNOSIS — N390 Urinary tract infection, site not specified: Secondary | ICD-10-CM | POA: Diagnosis not present

## 2014-11-30 LAB — POCT URINALYSIS DIP (DEVICE)
Bilirubin Urine: NEGATIVE
GLUCOSE, UA: NEGATIVE mg/dL
Hgb urine dipstick: NEGATIVE
Ketones, ur: NEGATIVE mg/dL
NITRITE: NEGATIVE
PROTEIN: 30 mg/dL — AB
Specific Gravity, Urine: 1.025 (ref 1.005–1.030)
UROBILINOGEN UA: 0.2 mg/dL (ref 0.0–1.0)
pH: 5.5 (ref 5.0–8.0)

## 2014-11-30 MED ORDER — CEPHALEXIN 500 MG PO CAPS: 500.0000 mg | ORAL_CAPSULE | Freq: Four times a day (QID) | ORAL | Status: DC

## 2014-11-30 MED ORDER — TOLTERODINE TARTRATE ER 2 MG PO CP24: 2.0000 mg | ORAL_CAPSULE | Freq: Every day | ORAL | Status: DC

## 2014-11-30 NOTE — Telephone Encounter (Signed)
Agree with advice given

## 2014-11-30 NOTE — Telephone Encounter (Signed)
Pts Husband calling requesting that she be seen by Dr Mare Ferrari today for complaints of blood in her urine, and possible UTI.  Husband states the pt has urinary frequency, burning, and lower abdominal pain.  Husband states that the pt complains of "not feeling well." Husband unclear of the color of her urine, for he states "she just tells me its bloody at times." Husband states they are currently driving home from Christmas vacation, and will not be back in town until around [place].  Husband requesting if Dr Mare Ferrari can't see her, then can he advise on prescribing the pt a antibiotic.  Informed the Husband that Dr Mare Ferrari is out of the office today, and offered multiple appts with an extender for further assessment.  Informed the Husband that typically when a pt has a UTI, there must be a urine specimen collected in order to assess for a UTI and initiate the appropriate antibiotic. Husband demanding only to see Dr Mare Ferrari.  Informed the Husband that Dr Mare Ferrari is in the hospital, and unsure when he will be able to further advise.  Informed the Husband that I will route this information to Dr Mare Ferrari for further review and recommendation, and someone will follow-up thereafter.  Highly advised the Husband not to delay care with this issue, for the pt can become very ill.  Highly advised the Husband to take the pt to urgent care now, or when they are back in town, for Dr Mare Ferrari is not here.  Husband states that he will take the pt to urgent care if Dr Mare Ferrari does not advise before then.

## 2014-11-30 NOTE — Discharge Instructions (Signed)
Take all of medicine as directed, drink lots of fluids, see your doctor if further problems. °

## 2014-11-30 NOTE — ED Provider Notes (Signed)
CSN: [id number]     Arrival date & time 11/30/14  1803 History   First MD Initiated Contact with Patient 11/30/14 1813     Chief Complaint  Patient presents with  . Urinary Tract Infection   (Consider location/radiation/quality/duration/timing/severity/associated sxs/prior Treatment) Patient is a 80 y.o. female presenting with urinary tract infection. The history is provided by the patient and the spouse.  Urinary Tract Infection This is a new problem. The current episode started more than 2 days ago. The problem has not changed since onset.Pertinent negatives include no chest pain and no abdominal pain.    Past Medical History  Diagnosis Date  . Hypertension   . Hyperlipidemia   . Depression   . History of migraines   . Vitamin D deficiency   . GERD (gastroesophageal reflux disease)   . Headache(784.0)   . Arthritis   . Cancer     hx of skin cancer   . Macular degeneration   . Myasthenia gravis   . Thoracic aneurysm     4.4cm; see CT done 01/28/12 and 01/22/13 in [facility].   [name] Spinal stenosis    Past Surgical History  Procedure Laterality Date  . Cholecystectomy    . US echocardiography  05/18/2007    EF 55-60%  . Abdominal hysterectomy    . Tonsillectomy    . Lumbar laminectomy/decompression microdiscectomy N/A 02/04/2013    Procedure: CENTRAL DECOMPRESSION/LUMBAR LAMINECTOMY L3-L4 AND L4-L5 2 LEVELS;  Surgeon: Tobi Bastos, MD;  Location: [facility];  Service: Orthopedics;  Laterality: N/A;   Family History  Problem Relation Age of Onset  . Heart attack Father   . Cancer Sister   . Cancer Brother   . Cancer Sister   . Cancer Sister    History  Substance Use Topics  . Smoking status: Never Smoker   . Smokeless tobacco: Never Used  . Alcohol Use: No   OB History    No data available     Review of Systems  Constitutional: Negative.  Negative for fever and chills.  Cardiovascular: Negative for chest pain.  Gastrointestinal: Negative for nausea, vomiting, abdominal  pain and diarrhea.  Genitourinary: Positive for dysuria, urgency, frequency and hematuria. Negative for flank pain, menstrual problem and pelvic pain.  Musculoskeletal: Positive for back pain.    Allergies  Review of patient's allergies indicates no known allergies.  Home Medications   Prior to Admission medications   Medication Sig Start Date End Date Taking? Authorizing Provider  amLODipine (NORVASC) 5 MG tablet 1/2 tablet daily    Darlin Coco, MD  aspirin EC 81 MG tablet Take 81 mg by mouth daily.     Historical Provider, MD  Calcium Carbonate (CALCIUM 500 PO) Take 1 capsule by mouth daily.     Historical Provider, MD  cephALEXin (KEFLEX) 500 MG capsule Take 1 capsule (500 mg total) by mouth 4 (four) times daily. Take all of medicine and drink lots of fluids 11/30/14   Billy Fischer, MD  cholecalciferol (VITAMIN D) 1000 UNITS tablet Take 1,000 Units by mouth daily.     Historical Provider, MD  citalopram (CELEXA) 40 MG tablet TAKE 1 TABLET EVERY DAY BEFORE BREAKFAST    Darlin Coco, MD  folic acid (FOLVITE) 810 MCG tablet Take 400 mcg by mouth daily.    Historical Provider, MD  gabapentin (NEURONTIN) 100 MG capsule 300 mg 4 (four) times daily. 1200 mg daily 09/09/13   Historical Provider, MD  Glucosamine-Chondroit-Vit C-Mn (GLUCOSAMINE 1500 COMPLEX PO) Take by  mouth daily.     Historical Provider, MD  losartan-hydrochlorothiazide Konrad Penta) 100-25 MG per tablet Take 1 tablet by mouth daily before breakfast. 03/03/14   Darlin Coco, MD  Multiple Vitamin (MULTIVITAMIN) tablet Take 1 tablet by mouth daily.     Historical Provider, MD  Multiple Vitamins-Minerals (PRESERVISION AREDS) CAPS Take 2 capsules by mouth daily.    Historical Provider, MD  Omega-3 Fatty Acids (FISH OIL) 435 MG CAPS Take 1 capsule by mouth daily.    Historical Provider, MD  omeprazole (PRILOSEC) 20 MG capsule Take 20 mg by mouth daily.     Historical Provider, MD  ondansetron (ZOFRAN) 4 MG tablet Take 1 tablet  (4 mg total) by mouth every 8 (eight) hours as needed for nausea. 02/11/13   Darlin Coco, MD  potassium chloride SA (KLOR-CON M20) 20 MEQ tablet Take 1 tablet (20 mEq total) by mouth 2 (two) times daily. 10/29/13   Darlin Coco, MD  predniSONE (DELTASONE) 10 MG tablet TAKE 1 TABLET EVERY DAY BEFORE BREAKFAST    Darlin Coco, MD  temazepam (RESTORIL) 15 MG capsule Take 1 capsule (15 mg total) by mouth at bedtime as needed for sleep. 07/26/14   Darlin Coco, MD  tolterodine (DETROL LA) 2 MG 24 hr capsule Take 1 capsule (2 mg total) by mouth daily. 11/30/14   Billy Fischer, MD   BP 115/71 mmHg  Pulse 79  Temp(Src) 98.5 F (36.9 C) (Oral)  Resp 18  SpO2 95% Physical Exam  Constitutional: She is oriented to person, place, and time. She appears well-developed and well-nourished. No distress.  Abdominal: Soft. Bowel sounds are normal. She exhibits no mass. There is no tenderness. There is no rebound and no guarding.  Neurological: She is alert and oriented to person, place, and time.  Skin: Skin is warm and dry.  Nursing note and vitals reviewed.   ED Course  Procedures (including critical care time) Labs Review Labs Reviewed  POCT URINALYSIS DIP (DEVICE) - Abnormal; Notable for the following:    Protein, ur 30 (*)    Leukocytes, UA SMALL (*)    All other components within normal limits    Imaging Review No results found.   MDM   1. UTI (lower urinary tract infection)        Billy Fischer, MD 11/30/14 [phone]

## 2014-11-30 NOTE — Telephone Encounter (Signed)
New Message         Pt's husband calling stating that pt needs to see Dr. Mare Ferrari as soon as possible due to her having blood in her urine. Advised pt's husband that Dr. Sherryl Barters first appt available is in Feb 2016, offered first appt available with a PA on 12/06/14 and they are unable to come and the next PA appt available is 12/20/14 and they refuse to wait that long. Please call pt back and advise.

## 2014-11-30 NOTE — ED Notes (Signed)
Concern for blood in urine since 12-24. Denies pain at present; NAD

## 2014-12-06 ENCOUNTER — Encounter: Payer: Self-pay | Admitting: Physical Therapy

## 2014-12-06 ENCOUNTER — Ambulatory Visit: Payer: Medicare Other | Attending: Cardiology | Admitting: Physical Therapy

## 2014-12-06 DIAGNOSIS — R269 Unspecified abnormalities of gait and mobility: Secondary | ICD-10-CM | POA: Diagnosis not present

## 2014-12-06 DIAGNOSIS — R2681 Unsteadiness on feet: Secondary | ICD-10-CM | POA: Insufficient documentation

## 2014-12-06 DIAGNOSIS — Z5189 Encounter for other specified aftercare: Secondary | ICD-10-CM | POA: Insufficient documentation

## 2014-12-06 DIAGNOSIS — R29898 Other symptoms and signs involving the musculoskeletal system: Secondary | ICD-10-CM

## 2014-12-06 NOTE — Therapy (Signed)
[facility] [address] [place], [place], [address] Phone: [phone]   Fax:  [phone]  Physical Therapy Treatment  Patient Details  Name: Tamara Gregory MRN: [id number] Date of Birth: [birth year]  Encounter Date: 12/06/2014      PT End of Session - 12/06/14 1636    Visit Number 9   Number of Visits 16   Date for PT Re-Evaluation 12/16/14   PT Start Time 1537   PT Stop Time 1625   PT Time Calculation (min) 48 min   Equipment Utilized During Treatment Gait belt   Activity Tolerance Patient tolerated treatment well   Behavior During Therapy [facility] for tasks assessed/performed      Past Medical History  Diagnosis Date  . Hypertension   . Hyperlipidemia   . Depression   . History of migraines   . Vitamin D deficiency   . GERD (gastroesophageal reflux disease)   . Headache(784.0)   . Arthritis   . Cancer     hx of skin cancer   . Macular degeneration   . Myasthenia gravis   . Thoracic aneurysm     4.4cm; see CT done 01/28/12 and 01/22/13 in [facility].   [name] Spinal stenosis     Past Surgical History  Procedure Laterality Date  . Cholecystectomy    . US echocardiography  05/18/2007    EF 55-60%  . Abdominal hysterectomy    . Tonsillectomy    . Lumbar laminectomy/decompression microdiscectomy N/A 02/04/2013    Procedure: CENTRAL DECOMPRESSION/LUMBAR LAMINECTOMY L3-L4 AND L4-L5 2 LEVELS;  Surgeon: Tobi Bastos, MD;  Location: [facility];  Service: Orthopedics;  Laterality: N/A;    There were no vitals taken for this visit.  Visit Diagnosis:  Abnormality of gait  Weakness of both lower extremities  Unsteady gait      Subjective Assessment - 12/06/14 1545    Symptoms Having L leg pain-worse recently but has been present since back surgery.  Pt reports that she is going to the beach for a couple weeks   Currently in Pain? No/denies                    [facility] Adult PT Treatment/Exercise - 12/06/14  1606    Berg Balance Test   Sit to Stand Able to stand without using hands and stabilize independently   Standing Unsupported Able to stand safely 2 minutes   Sitting with Back Unsupported but Feet Supported on Floor or Stool Able to sit safely and securely 2 minutes   Stand to Sit Sits safely with minimal use of hands   Transfers Able to transfer safely, minor use of hands   Standing Unsupported with Eyes Closed Able to stand 10 seconds safely   Standing Ubsupported with Feet Together Able to place feet together independently and stand for 1 minute with supervision   From Standing, Reach Forward with Outstretched Arm Can reach confidently >25 cm (10")   From Standing Position, Pick up Object from Floor Able to pick up shoe safely and easily   From Standing Position, Turn to Look Behind Over each Shoulder Looks behind one side only/other side shows less weight shift   Turn 360 Degrees Able to turn 360 degrees safely but slowly   Standing Unsupported, Alternately Place Feet on Step/Stool Able to complete >2 steps/needs minimal assist   Standing Unsupported, One Foot in Front Able to plae foot ahead of the other independently and hold 30 seconds  Standing on One Leg Tries to lift leg/unable to hold 3 seconds but remains standing independently   Total Score 45   Timed Up and Go Test   TUG Normal TUG   Normal TUG (seconds) 20.3  no device;30.9 with rollator   High Level Balance   High Level Balance Activities Side stepping;Backward walking  in parallel bars with no to 1 UE support                  PT Short Term Goals - 11/17/14 0906    PT SHORT TERM GOAL #1   Title independent with HEP (11/14/14)   Time 4   Period Weeks   Status Achieved           PT Long Term Goals - 12/06/14 1639    PT LONG TERM GOAL #1   Title verbalize understanding of fall prevention strategies (12/12/14)   Time 8   Period Weeks   Status On-going   PT LONG TERM GOAL #2   Title improve BERG  balance score to > 40/56 for improved mobility (12/12/14)   Time 8   Period Weeks   Status Achieved   PT LONG TERM GOAL #3   Title improve timed up and go to < 35 sec with LRAD for improved mobility (12/12/14)   Time 8   Period Weeks   Status Achieved   PT LONG TERM GOAL #4   Title ambulate > 300' with LRAD modified independent for improved mobility (12/12/14)   Time 8   Period Weeks   Status On-going   PT LONG TERM GOAL #5   Title improve gait velocity to > 1.8 ft/sec for improved mobility and decreased fall risk (12/12/14)   Time 8   Period Weeks   Status On-going               Plan - 12/06/14 1637    Clinical Impression Statement Pt met LTG 2 and 3.  States her husband feels like she is getting stronger.  Planning on being out of town for several weeks and will call back to schedule further appointment.  Will discuss with Mady Haagensen, PT.   Pt will benefit from skilled therapeutic intervention in order to improve on the following deficits Abnormal gait;Decreased balance;Decreased activity tolerance;Pain;Impaired perceived functional ability;Decreased strength;Decreased mobility;Decreased knowledge of use of DME;Decreased knowledge of precautions;Decreased safety awareness;Improper body mechanics;Postural dysfunction;Difficulty walking   Rehab Potential Good   PT Frequency 2x / week   PT Duration 8 weeks   PT Treatment/Interventions ADLs/Self Care Home Management;Gait training;Neuromuscular re-education;Stair training;Ultrasound;Functional mobility training;Passive range of motion;Patient/family education;Therapeutic activities;Cryotherapy;Electrical Stimulation;Therapeutic exercise;Manual techniques;Energy conservation;Balance training;DME Instruction;Moist Heat   PT Next Visit Plan Finish checking goals, G-code and recertify if patient returns.   Consulted and Agree with Plan of Care Patient        Problem List Patient Active Problem List   Diagnosis Date Noted  . Low  hemoglobin 09/22/2014  . Insomnia 07/26/2014  . Malaise and fatigue 10/04/2013  . PAC (premature atrial contraction) 10/04/2013  . Depression (emotion) 02/15/2013  . Spinal stenosis, lumbar region, with neurogenic claudication 02/04/2013  . Myasthenia gravis 03/17/2012  . Pure hypercholesterolemia 09/05/2011  . Benign hypertensive heart disease without heart failure 09/05/2011  . Osteoarthritis 09/05/2011    Narda Bonds 12/06/2014, 4:41 PM  [facility] [address] [place], [place], [address] Phone: [phone]   Fax:  [place]  [place] 12/06/2014 4:41 PM Phone: [phone] Fax: [phone]

## 2014-12-15 ENCOUNTER — Encounter (HOSPITAL_COMMUNITY): Payer: Self-pay | Admitting: Orthopedic Surgery

## 2014-12-29 ENCOUNTER — Other Ambulatory Visit: Payer: Self-pay | Admitting: Cardiology

## 2015-01-03 ENCOUNTER — Telehealth: Payer: Self-pay | Admitting: Cardiology

## 2015-01-03 MED ORDER — TOLTERODINE TARTRATE ER 2 MG PO CP24: 2.0000 mg | ORAL_CAPSULE | Freq: Every day | ORAL | Status: DC

## 2015-01-03 NOTE — Telephone Encounter (Signed)
Spoke with patients husband and patient Rx'd Detrol at Urgent Care in December.  Per husband this seems to have helped her overactive bladder  Requesting  Dr. Mare Ferrari refill Discussed with  Dr. Mare Ferrari and ok to refill

## 2015-01-03 NOTE — Telephone Encounter (Signed)
New Msg          Pt husband calling, would like a call back. No details given.

## 2015-01-11 DIAGNOSIS — Z961 Presence of intraocular lens: Secondary | ICD-10-CM | POA: Diagnosis not present

## 2015-01-11 DIAGNOSIS — H3530 Unspecified macular degeneration: Secondary | ICD-10-CM | POA: Diagnosis not present

## 2015-01-11 DIAGNOSIS — H524 Presbyopia: Secondary | ICD-10-CM | POA: Diagnosis not present

## 2015-01-11 DIAGNOSIS — H40002 Preglaucoma, unspecified, left eye: Secondary | ICD-10-CM | POA: Diagnosis not present

## 2015-01-13 DIAGNOSIS — H40002 Preglaucoma, unspecified, left eye: Secondary | ICD-10-CM | POA: Diagnosis not present

## 2015-01-16 DIAGNOSIS — H353130 Nonexudative age-related macular degeneration, bilateral, stage unspecified: Secondary | ICD-10-CM | POA: Insufficient documentation

## 2015-01-16 DIAGNOSIS — M4806 Spinal stenosis, lumbar region: Secondary | ICD-10-CM | POA: Diagnosis not present

## 2015-01-16 DIAGNOSIS — G894 Chronic pain syndrome: Secondary | ICD-10-CM | POA: Diagnosis not present

## 2015-01-16 DIAGNOSIS — Z961 Presence of intraocular lens: Secondary | ICD-10-CM | POA: Insufficient documentation

## 2015-01-16 DIAGNOSIS — M5417 Radiculopathy, lumbosacral region: Secondary | ICD-10-CM | POA: Diagnosis not present

## 2015-01-26 ENCOUNTER — Other Ambulatory Visit: Payer: Self-pay | Admitting: Cardiology

## 2015-01-26 DIAGNOSIS — G4700 Insomnia, unspecified: Secondary | ICD-10-CM

## 2015-01-27 DIAGNOSIS — G7000 Myasthenia gravis without (acute) exacerbation: Secondary | ICD-10-CM | POA: Diagnosis not present

## 2015-01-27 DIAGNOSIS — M5417 Radiculopathy, lumbosacral region: Secondary | ICD-10-CM | POA: Diagnosis not present

## 2015-01-27 DIAGNOSIS — Z961 Presence of intraocular lens: Secondary | ICD-10-CM | POA: Diagnosis not present

## 2015-01-27 DIAGNOSIS — H3530 Unspecified macular degeneration: Secondary | ICD-10-CM | POA: Diagnosis not present

## 2015-01-27 DIAGNOSIS — E785 Hyperlipidemia, unspecified: Secondary | ICD-10-CM | POA: Diagnosis not present

## 2015-01-27 DIAGNOSIS — M961 Postlaminectomy syndrome, not elsewhere classified: Secondary | ICD-10-CM | POA: Diagnosis not present

## 2015-01-27 DIAGNOSIS — M4806 Spinal stenosis, lumbar region: Secondary | ICD-10-CM | POA: Diagnosis not present

## 2015-01-27 DIAGNOSIS — Z7982 Long term (current) use of aspirin: Secondary | ICD-10-CM | POA: Diagnosis not present

## 2015-01-27 DIAGNOSIS — I10 Essential (primary) hypertension: Secondary | ICD-10-CM | POA: Diagnosis not present

## 2015-01-27 DIAGNOSIS — G894 Chronic pain syndrome: Secondary | ICD-10-CM | POA: Diagnosis not present

## 2015-01-31 ENCOUNTER — Ambulatory Visit (INDEPENDENT_AMBULATORY_CARE_PROVIDER_SITE_OTHER): Payer: Medicare Other | Admitting: Cardiology

## 2015-01-31 ENCOUNTER — Encounter: Payer: Self-pay | Admitting: Cardiology

## 2015-01-31 ENCOUNTER — Other Ambulatory Visit (INDEPENDENT_AMBULATORY_CARE_PROVIDER_SITE_OTHER): Payer: Medicare Other | Admitting: *Deleted

## 2015-01-31 VITALS — BP 138/80 | HR 76 | Ht 65.0 in | Wt 155.0 lb

## 2015-01-31 DIAGNOSIS — E7800 Pure hypercholesterolemia, unspecified: Secondary | ICD-10-CM

## 2015-01-31 DIAGNOSIS — I119 Hypertensive heart disease without heart failure: Secondary | ICD-10-CM

## 2015-01-31 DIAGNOSIS — D649 Anemia, unspecified: Secondary | ICD-10-CM

## 2015-01-31 LAB — BASIC METABOLIC PANEL
BUN: 16 mg/dL (ref 6–23)
CHLORIDE: 105 meq/L (ref 96–112)
CO2: 29 meq/L (ref 19–32)
CREATININE: 0.91 mg/dL (ref 0.40–1.20)
Calcium: 10.1 mg/dL (ref 8.4–10.5)
GFR: 62 mL/min (ref >60.00)
GLUCOSE: 109 mg/dL — AB (ref 70–99)
POTASSIUM: 3.6 meq/L (ref 3.5–5.1)
Sodium: 141 mEq/L (ref 135–145)

## 2015-01-31 LAB — HEPATIC FUNCTION PANEL
ALT: 16 U/L (ref 0–35)
AST: 20 U/L (ref 0–37)
Albumin: 4.1 g/dL (ref 3.5–5.2)
Alkaline Phosphatase: 68 U/L (ref 39–117)
BILIRUBIN DIRECT: 0.1 mg/dL (ref 0.0–0.3)
Total Bilirubin: 0.6 mg/dL (ref 0.2–1.2)
Total Protein: 7 g/dL (ref 6.0–8.3)

## 2015-01-31 LAB — LIPID PANEL
Cholesterol: 212 mg/dL — ABNORMAL HIGH (ref 0–200)
HDL: 57.4 mg/dL (ref >39.00)
LDL CALC: 131 mg/dL — AB (ref 0–99)
NONHDL: 154.6
Total CHOL/HDL Ratio: 4
Triglycerides: 118 mg/dL (ref 0.0–149.0)
VLDL: 23.6 mg/dL (ref 0.0–40.0)

## 2015-01-31 NOTE — Progress Notes (Signed)
Quick Note:  Please report to patient. The recent labs are stable. Continue same medication and careful diet. Cholesterol and LDL are higher. Watch diet carefully. ______

## 2015-01-31 NOTE — Progress Notes (Signed)
Cardiology Office Note   Date:  01/31/2015   ID:  Tamara Gregory, DOB 1925/07/31, MRN [id number]  PCP:  Darlin Coco, MD  Cardiologist:   Darlin Coco, MD   No chief complaint on file.     History of Present Illness: Tamara Gregory is a 81 y.o. female who presents for a four-month follow-up office visit  This pleasant 81 year old woman is seen for a scheduled followup office visit. She has a past history of hypercholesterolemia and essential hypertension. She has a history of myasthenia gravis and is followed for that at [facility].  She has had previous back surgery by Dr. Gladstone Lighter. She has a known 4 cm fusiform ascending aortic aneurysm which is unchanged over the past year. When the patient was initially admitted for her back surgery she had some chest pain in the preoperative holding area and her back surgery had to be postponed while she underwent a cardiac workup. She had a Myoview stress test which showed no ischemia. Her back surgery was subsequently rescheduled and performed without incident. She made a good recovery from her back surgery.  However she has continued to have back pain and for this reason she underwent a implantation of a spinal cord stimulator as an outpatient at [facility] outpatient facility on 01/27/2014. The patient remains on low-dose prednisone 5 mg daily for her myasthenia gravis.  The patient earlier this year was hospitalized overnight at [facility] in [facility] for chest pain. She had a dobutamine stress echo the following morning which showed no evidence of ischemia and she was released. She was admitted with bradycardia and her metoprolol was stopped and has not been restarted. She has had previous falls but has had no further falls since her last visit 4 months ago.  At that time we did a chest x-ray to rule out rib fractures and there were no rib fracture seen. She is followed at the pain  clinic at [facility]. . She has had diminished bladder control and has to wear depends. She has not been experiencing any chest pain or shortness of breath or palpitations.  She continues to complain of lack of energy  Past Medical History  Diagnosis Date  . Hypertension   . Hyperlipidemia   . Depression   . History of migraines   . Vitamin D deficiency   . GERD (gastroesophageal reflux disease)   . Headache(784.0)   . Arthritis   . Cancer     hx of skin cancer   . Macular degeneration   . Myasthenia gravis   . Thoracic aneurysm     4.4cm; see CT done 01/28/12 and 01/22/13 in [facility].   [name] Spinal stenosis     Past Surgical History  Procedure Laterality Date  . Cholecystectomy    . US echocardiography  05/18/2007    EF 55-60%  . Abdominal hysterectomy    . Tonsillectomy    . Lumbar laminectomy/decompression microdiscectomy N/A 02/04/2013    Procedure: CENTRAL DECOMPRESSION/LUMBAR LAMINECTOMY L3-L4 AND L4-L5 2 LEVELS;  Surgeon: Tobi Bastos, MD;  Location: [facility];  Service: Orthopedics;  Laterality: N/A;     Current Outpatient Prescriptions  Medication Sig Dispense Refill  . amLODipine (NORVASC) 5 MG tablet 1/2 tablet daily    . aspirin EC 81 MG tablet Take 81 mg by mouth daily.     . Calcium Carbonate (CALCIUM 500 PO) Take 1 capsule by mouth daily.     [name]  cephALEXin (KEFLEX) 500 MG capsule Take 1 capsule (500 mg total) by mouth 4 (four) times daily. Take all of medicine and drink lots of fluids 20 capsule 0  . cholecalciferol (VITAMIN D) 1000 UNITS tablet Take 1,000 Units by mouth daily.     . citalopram (CELEXA) 40 MG tablet TAKE 1 TABLET EVERY DAY BEFORE BREAKFAST 90 tablet 3  . folic acid (FOLVITE) 458 MCG tablet Take 400 mcg by mouth daily.    [name] gabapentin (NEURONTIN) 100 MG capsule 300 mg 4 (four) times daily. 1200 mg daily    . Glucosamine-Chondroit-Vit C-Mn (GLUCOSAMINE 1500 COMPLEX PO) Take by mouth daily.     [name] losartan-hydrochlorothiazide (HYZAAR) 100-25 MG per  tablet TAKE 1 TABLET EVERY DAY BEFORE BREAKFAST 90 tablet 0  . Multiple Vitamin (MULTIVITAMIN) tablet Take 1 tablet by mouth daily.     . Multiple Vitamins-Minerals (PRESERVISION AREDS) CAPS Take 2 capsules by mouth daily.    . Omega-3 Fatty Acids (FISH OIL) 435 MG CAPS Take 1 capsule by mouth daily.    [name] omeprazole (PRILOSEC) 20 MG capsule Take 20 mg by mouth daily.     . ondansetron (ZOFRAN) 4 MG tablet Take 1 tablet (4 mg total) by mouth every 8 (eight) hours as needed for nausea. 15 tablet 0  . predniSONE (DELTASONE) 10 MG tablet TAKE 1 TABLET EVERY DAY BEFORE BREAKFAST 90 tablet 3  . temazepam (RESTORIL) 15 MG capsule take 1 capsule by mouth at bedtime if needed 30 capsule 0  . tolterodine (DETROL LA) 2 MG 24 hr capsule Take 1 capsule (2 mg total) by mouth daily. 30 capsule 5   No current facility-administered medications for this visit.    Allergies:   Review of patient's allergies indicates no known allergies.    Social History:  The patient  reports that she has never smoked. She has never used smokeless tobacco. She reports that she does not drink alcohol or use illicit drugs.   Family History:  The patient's family history includes Cancer in her brother, sister, sister, and sister; Heart attack in her father.    ROS:  Please see the history of present illness.   Otherwise, review of systems are positive for none.   All other systems are reviewed and negative.    PHYSICAL EXAM: VS:  BP 138/80 mmHg  Pulse 76  Ht 5\' 5"  (1.651 m)  Wt 155 lb (70.308 kg)  BMI 25.79 kg/m2 , BMI Body mass index is 25.79 kg/(m^2). GEN: Well nourished, well developed, in no acute distress HEENT: normal Neck: no JVD, carotid bruits, or masses Cardiac: RRR; no murmurs, rubs, or gallops,no edema  Respiratory:  clear to auscultation bilaterally, normal work of breathing GI: soft, nontender, nondistended, + BS MS: no deformity or atrophy Skin: warm and dry, no rash Neuro:  Strength and sensation are  intact Psych: euthymic mood, full affect   EKG:  EKG is not ordered today.    Recent Labs: 07/28/2014: ALT 23; BUN 26*; Creatinine 0.9; Potassium 4.0; Sodium 137 09/22/2014: Hemoglobin 12.6; Platelets 250.0    Lipid Panel    Component Value Date/Time   CHOL 188 07/28/2014 1003   TRIG 71.0 07/28/2014 1003   HDL 70.00 07/28/2014 1003   CHOLHDL 3 07/28/2014 1003   VLDL 14.2 07/28/2014 1003   LDLCALC 104* 07/28/2014 1003   LDLDIRECT 139.3 10/04/2013 1000      Wt Readings from Last 3 Encounters:  01/31/15 155 lb (70.308 kg)  09/22/14 158 lb 6.4 oz (71.85  kg)  07/26/14 157 lb (71.215 kg)         ASSESSMENT AND PLAN:  1. 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 Hypercholesterolemia, 3. myasthenia gravis followed at [facility] 4. past history of spinal stenosis with chronic low back pain.  Implantation of spinal cord stimulator at [facility] on 01/27/14 5. Insomnia 6. malaise and fatigue 7. anemia etiology to be determined 8. Depression 9. frequent falls and poor balance  Disposition: Continue current medication.  We are refilling her Restoril at her request for insomnia.   Current medicines are reviewed at length with the patient today.  The patient does not have concerns regarding medicines.  The following changes have been made:  no change   No orders of the defined types were placed in this encounter.     Disposition:   FU with Dr. Mare Ferrari in 4 months for office visit and fasting lab work   Signed, Darlin Coco, MD  01/31/2015 10:40 AM    [address], [place],   [address] Phone: [phone]; Fax: [phone]

## 2015-01-31 NOTE — Patient Instructions (Addendum)

## 2015-02-06 ENCOUNTER — Other Ambulatory Visit: Payer: Self-pay | Admitting: *Deleted

## 2015-02-06 DIAGNOSIS — G4700 Insomnia, unspecified: Secondary | ICD-10-CM

## 2015-02-06 MED ORDER — TEMAZEPAM 15 MG PO CAPS: ORAL_CAPSULE | ORAL | Status: DC

## 2015-02-07 DIAGNOSIS — G894 Chronic pain syndrome: Secondary | ICD-10-CM | POA: Diagnosis not present

## 2015-02-07 DIAGNOSIS — M961 Postlaminectomy syndrome, not elsewhere classified: Secondary | ICD-10-CM | POA: Diagnosis not present

## 2015-02-07 DIAGNOSIS — Z4549 Encounter for adjustment and management of other implanted nervous system device: Secondary | ICD-10-CM | POA: Diagnosis not present

## 2015-02-07 DIAGNOSIS — M79605 Pain in left leg: Secondary | ICD-10-CM | POA: Diagnosis not present

## 2015-02-07 DIAGNOSIS — Z5181 Encounter for therapeutic drug level monitoring: Secondary | ICD-10-CM | POA: Diagnosis not present

## 2015-02-17 DIAGNOSIS — Z9889 Other specified postprocedural states: Secondary | ICD-10-CM | POA: Diagnosis not present

## 2015-02-17 DIAGNOSIS — R110 Nausea: Secondary | ICD-10-CM | POA: Diagnosis not present

## 2015-02-17 DIAGNOSIS — K5730 Diverticulosis of large intestine without perforation or abscess without bleeding: Secondary | ICD-10-CM | POA: Diagnosis not present

## 2015-02-17 DIAGNOSIS — I10 Essential (primary) hypertension: Secondary | ICD-10-CM | POA: Diagnosis not present

## 2015-02-17 DIAGNOSIS — R112 Nausea with vomiting, unspecified: Secondary | ICD-10-CM | POA: Diagnosis not present

## 2015-02-17 DIAGNOSIS — R109 Unspecified abdominal pain: Secondary | ICD-10-CM | POA: Diagnosis not present

## 2015-02-17 DIAGNOSIS — R42 Dizziness and giddiness: Secondary | ICD-10-CM | POA: Diagnosis not present

## 2015-02-17 DIAGNOSIS — R1084 Generalized abdominal pain: Secondary | ICD-10-CM | POA: Diagnosis not present

## 2015-02-17 DIAGNOSIS — R51 Headache: Secondary | ICD-10-CM | POA: Diagnosis not present

## 2015-02-17 DIAGNOSIS — K449 Diaphragmatic hernia without obstruction or gangrene: Secondary | ICD-10-CM | POA: Diagnosis not present

## 2015-02-17 DIAGNOSIS — Z8739 Personal history of other diseases of the musculoskeletal system and connective tissue: Secondary | ICD-10-CM | POA: Diagnosis not present

## 2015-02-17 DIAGNOSIS — R1031 Right lower quadrant pain: Secondary | ICD-10-CM | POA: Diagnosis not present

## 2015-02-17 DIAGNOSIS — Z8679 Personal history of other diseases of the circulatory system: Secondary | ICD-10-CM | POA: Diagnosis not present

## 2015-02-18 DIAGNOSIS — R110 Nausea: Secondary | ICD-10-CM | POA: Diagnosis not present

## 2015-02-18 DIAGNOSIS — K5730 Diverticulosis of large intestine without perforation or abscess without bleeding: Secondary | ICD-10-CM | POA: Diagnosis not present

## 2015-02-18 DIAGNOSIS — R1031 Right lower quadrant pain: Secondary | ICD-10-CM | POA: Diagnosis not present

## 2015-02-18 DIAGNOSIS — K449 Diaphragmatic hernia without obstruction or gangrene: Secondary | ICD-10-CM | POA: Diagnosis not present

## 2015-02-20 ENCOUNTER — Telehealth: Payer: Self-pay | Admitting: Cardiology

## 2015-02-20 DIAGNOSIS — H4011X1 Primary open-angle glaucoma, mild stage: Secondary | ICD-10-CM | POA: Diagnosis not present

## 2015-02-20 NOTE — Telephone Encounter (Signed)
Spoke with husband and they were in [place] ant patient started having nausea and vomiting that lasted couple of days Took to ED there and patient had multiple studies that checked out ok except for UTI Was told to follow up when home Per husband patient is doing better and eating, does not think she needs to be seen Advised to call back Wednesday when  Dr. Mare Ferrari back in the office with update

## 2015-02-20 NOTE — Telephone Encounter (Signed)
New Message  Pt was d/c'd from [place] and told to see Cardiologist as early as possible. Pt husband calling to speak w/ Rn. Please call back and discuss.

## 2015-02-22 NOTE — Telephone Encounter (Signed)
Spoke with husband and patient is doing better Husband does not feel patient needs follow up at this time but call back if something changes

## 2015-02-28 ENCOUNTER — Other Ambulatory Visit: Payer: Self-pay | Admitting: Cardiology

## 2015-02-28 DIAGNOSIS — M1990 Unspecified osteoarthritis, unspecified site: Secondary | ICD-10-CM

## 2015-02-28 DIAGNOSIS — F329 Major depressive disorder, single episode, unspecified: Secondary | ICD-10-CM

## 2015-02-28 DIAGNOSIS — G7000 Myasthenia gravis without (acute) exacerbation: Secondary | ICD-10-CM

## 2015-02-28 DIAGNOSIS — F32A Depression, unspecified: Secondary | ICD-10-CM

## 2015-03-30 DIAGNOSIS — Z85828 Personal history of other malignant neoplasm of skin: Secondary | ICD-10-CM | POA: Diagnosis not present

## 2015-03-30 DIAGNOSIS — L821 Other seborrheic keratosis: Secondary | ICD-10-CM | POA: Diagnosis not present

## 2015-04-04 DIAGNOSIS — G894 Chronic pain syndrome: Secondary | ICD-10-CM | POA: Diagnosis not present

## 2015-04-04 DIAGNOSIS — H4011X1 Primary open-angle glaucoma, mild stage: Secondary | ICD-10-CM | POA: Diagnosis not present

## 2015-04-04 DIAGNOSIS — Z9689 Presence of other specified functional implants: Secondary | ICD-10-CM | POA: Diagnosis not present

## 2015-04-04 DIAGNOSIS — H3532 Exudative age-related macular degeneration: Secondary | ICD-10-CM | POA: Diagnosis not present

## 2015-04-04 DIAGNOSIS — Z7952 Long term (current) use of systemic steroids: Secondary | ICD-10-CM | POA: Diagnosis not present

## 2015-04-04 DIAGNOSIS — H353290 Exudative age-related macular degeneration, unspecified eye, stage unspecified: Secondary | ICD-10-CM | POA: Insufficient documentation

## 2015-04-04 DIAGNOSIS — M961 Postlaminectomy syndrome, not elsewhere classified: Secondary | ICD-10-CM | POA: Diagnosis not present

## 2015-04-04 DIAGNOSIS — I10 Essential (primary) hypertension: Secondary | ICD-10-CM | POA: Diagnosis not present

## 2015-04-04 DIAGNOSIS — H3531 Nonexudative age-related macular degeneration: Secondary | ICD-10-CM | POA: Diagnosis not present

## 2015-04-04 DIAGNOSIS — M545 Low back pain: Secondary | ICD-10-CM | POA: Diagnosis not present

## 2015-04-04 DIAGNOSIS — Z961 Presence of intraocular lens: Secondary | ICD-10-CM | POA: Diagnosis not present

## 2015-05-11 ENCOUNTER — Ambulatory Visit (INDEPENDENT_AMBULATORY_CARE_PROVIDER_SITE_OTHER): Payer: Medicare Other | Admitting: Cardiology

## 2015-05-11 ENCOUNTER — Encounter: Payer: Self-pay | Admitting: Cardiology

## 2015-05-11 VITALS — BP 132/82 | HR 74 | Ht 64.0 in | Wt 142.6 lb

## 2015-05-11 DIAGNOSIS — I119 Hypertensive heart disease without heart failure: Secondary | ICD-10-CM | POA: Diagnosis not present

## 2015-05-11 DIAGNOSIS — E7800 Pure hypercholesterolemia, unspecified: Secondary | ICD-10-CM

## 2015-05-11 DIAGNOSIS — G7000 Myasthenia gravis without (acute) exacerbation: Secondary | ICD-10-CM | POA: Diagnosis not present

## 2015-05-11 DIAGNOSIS — D649 Anemia, unspecified: Secondary | ICD-10-CM | POA: Diagnosis not present

## 2015-05-11 LAB — CBC WITH DIFFERENTIAL/PLATELET
BASOS PCT: 0.5 % (ref 0.0–3.0)
Basophils Absolute: 0 10*3/uL (ref 0.0–0.1)
Eosinophils Absolute: 0.2 10*3/uL (ref 0.0–0.7)
Eosinophils Relative: 2.5 % (ref 0.0–5.0)
HEMATOCRIT: 38 % (ref 36.0–46.0)
HEMOGLOBIN: 12.7 g/dL (ref 12.0–15.0)
LYMPHS ABS: 1.2 10*3/uL (ref 0.7–4.0)
LYMPHS PCT: 16.8 % (ref 12.0–46.0)
MCHC: 33.5 g/dL (ref 30.0–36.0)
MCV: 94.8 fl (ref 78.0–100.0)
Monocytes Absolute: 0.9 10*3/uL (ref 0.1–1.0)
Monocytes Relative: 11.9 % (ref 3.0–12.0)
NEUTROS PCT: 68.3 % (ref 43.0–77.0)
Neutro Abs: 5.1 10*3/uL (ref 1.4–7.7)
Platelets: 230 10*3/uL (ref 150.0–400.0)
RBC: 4.01 Mil/uL (ref 3.87–5.11)
RDW: 14.4 % (ref 11.5–15.5)
WBC: 7.4 10*3/uL (ref 4.0–10.5)

## 2015-05-11 LAB — BASIC METABOLIC PANEL
BUN: 18 mg/dL (ref 6–23)
CO2: 30 mEq/L (ref 19–32)
CREATININE: 0.86 mg/dL (ref 0.40–1.20)
Calcium: 9.9 mg/dL (ref 8.4–10.5)
Chloride: 103 mEq/L (ref 96–112)
GFR: 66.14 mL/min (ref >60.00)
Glucose, Bld: 93 mg/dL (ref 70–99)
Potassium: 3.6 mEq/L (ref 3.5–5.1)
Sodium: 139 mEq/L (ref 135–145)

## 2015-05-11 LAB — HEPATIC FUNCTION PANEL
ALBUMIN: 4.3 g/dL (ref 3.5–5.2)
ALT: 16 U/L (ref 0–35)
AST: 17 U/L (ref 0–37)
Alkaline Phosphatase: 54 U/L (ref 39–117)
BILIRUBIN DIRECT: 0.1 mg/dL (ref 0.0–0.3)
BILIRUBIN TOTAL: 0.7 mg/dL (ref 0.2–1.2)
Total Protein: 6.8 g/dL (ref 6.0–8.3)

## 2015-05-11 LAB — LIPID PANEL
Cholesterol: 218 mg/dL — ABNORMAL HIGH (ref 0–200)
HDL: 65.2 mg/dL (ref >39.00)
LDL Cholesterol: 128 mg/dL — ABNORMAL HIGH (ref 0–99)
NonHDL: 152.8
Total CHOL/HDL Ratio: 3
Triglycerides: 126 mg/dL (ref 0.0–149.0)
VLDL: 25.2 mg/dL (ref 0.0–40.0)

## 2015-05-11 NOTE — Patient Instructions (Signed)
Medication Instructions:  Your physician recommends that you continue on your current medications as directed. Please refer to the Current Medication list given to you today.  Labwork: LP/BMET/HFP/CBC  Testing/Procedures: NONE  Follow-Up: Your physician wants you to follow-up in: 4 months with fasting labs (lp/bmet/hfp/CBC)  You will receive a reminder letter in the mail two months in advance. If you don't receive a letter, please call our office to schedule the follow-up appointment.  Any Other Special Instructions Will Be Listed Below (If Applicable).

## 2015-05-11 NOTE — Progress Notes (Signed)
Cardiology Office Note   Date:  05/11/2015   ID:  Tamara Gregory, DOB Nov 15, 1925, MRN [id number]  PCP:  Warren Danes, MD  Cardiologist: Darlin Coco MD  No chief complaint on file.     History of Present Illness: Tamara Gregory is a 81 y.o. female who presents for scheduled four-month follow-up.  This pleasant 81 year old woman is seen for a scheduled followup office visit. She has a past history of hypercholesterolemia and essential hypertension. She has a history of myasthenia gravis and is followed for that at [facility]. She has had previous back surgery by Dr. Gladstone Lighter. She has a known 4 cm fusiform ascending aortic aneurysm which is unchanged over the past year. When the patient was initially admitted for her back surgery she had some chest pain in the preoperative holding area and her back surgery had to be postponed while she underwent a cardiac workup. She had a Myoview stress test which showed no ischemia. Her back surgery was subsequently rescheduled and performed without incident. She made a good recovery from her back surgery. However she has continued to have back pain and for this reason she underwent a implantation of a spinal cord stimulator as an outpatient at [facility] outpatient facility on 01/27/2014. The patient remains on low-dose prednisone 5 mg daily for her myasthenia gravis.  The patient earlier this year was hospitalized overnight at [facility] in [facility] for chest pain. She had a dobutamine stress echo the following morning which showed no evidence of ischemia and she was released. She was admitted with bradycardia and her metoprolol was stopped and has not been restarted. She has had previous falls but has had no further falls since her last visit 4 months ago. At that time we did a chest x-ray to rule out rib fractures and there were no rib fracture seen. She is followed at the pain clinic at  [facility]. . She has had diminished bladder control and has to wear depends. She has not been experiencing any chest pain or shortness of breath or palpitations. She continues to complain of lack of energy. She has been spending a lot of time down at [employer].  She came back for this examination  Past Medical History  Diagnosis Date  . Hypertension   . Hyperlipidemia   . Depression   . History of migraines   . Vitamin D deficiency   . GERD (gastroesophageal reflux disease)   . Headache(784.0)   . Arthritis   . Cancer     hx of skin cancer   . Macular degeneration   . Myasthenia gravis   . Thoracic aneurysm     4.4cm; see CT done 01/28/12 and 01/22/13 in [facility].   [name] Spinal stenosis     Past Surgical History  Procedure Laterality Date  . Cholecystectomy    . US echocardiography  05/18/2007    EF 55-60%  . Abdominal hysterectomy    . Tonsillectomy    . Lumbar laminectomy/decompression microdiscectomy N/A 02/04/2013    Procedure: CENTRAL DECOMPRESSION/LUMBAR LAMINECTOMY L3-L4 AND L4-L5 2 LEVELS;  Surgeon: Tobi Bastos, MD;  Location: [facility];  Service: Orthopedics;  Laterality: N/A;     Current Outpatient Prescriptions  Medication Sig Dispense Refill  . amLODipine (NORVASC) 5 MG tablet 1/2 tablet by mouth daily    . aspirin EC 81 MG tablet Take 81 mg by mouth daily.     . Calcium Carbonate (CALCIUM  500 PO) Take 1 capsule by mouth daily.     . cephALEXin (KEFLEX) 500 MG capsule Take 1 capsule (500 mg total) by mouth 4 (four) times daily. Take all of medicine and drink lots of fluids 20 capsule 0  . cholecalciferol (VITAMIN D) 1000 UNITS tablet Take 1,000 Units by mouth daily.     . citalopram (CELEXA) 40 MG tablet Take 40 mg by mouth daily.    . folic acid (FOLVITE) 638 MCG tablet Take 400 mcg by mouth daily.    [name] gabapentin (NEURONTIN) 100 MG capsule Take 300 mg by mouth 4 (four) times daily. 1200 mg daily    . Glucosamine-Chondroit-Vit C-Mn (GLUCOSAMINE 1500  COMPLEX PO) Take 1 tablet by mouth daily.     [name] latanoprost (XALATAN) 0.005 % ophthalmic solution Place 1 drop into the left eye at bedtime.  1  . losartan-hydrochlorothiazide (HYZAAR) 100-25 MG per tablet TAKE 1 TABLET EVERY DAY BEFORE BREAKFAST 90 tablet 1  . Multiple Vitamin (MULTIVITAMIN) tablet Take 1 tablet by mouth daily.     . Multiple Vitamins-Minerals (PRESERVISION AREDS) CAPS Take 2 capsules by mouth daily.    . Omega-3 Fatty Acids (FISH OIL) 435 MG CAPS Take 1 capsule by mouth daily.    [name] omeprazole (PRILOSEC) 20 MG capsule Take 20 mg by mouth daily.     . ondansetron (ZOFRAN) 4 MG tablet Take 1 tablet (4 mg total) by mouth every 8 (eight) hours as needed for nausea. 15 tablet 0  . predniSONE (DELTASONE) 10 MG tablet Take 10 mg by mouth daily with breakfast.    . temazepam (RESTORIL) 15 MG capsule Take 15 mg by mouth at bedtime as needed for sleep (sleep).    . tolterodine (DETROL LA) 2 MG 24 hr capsule Take 1 capsule (2 mg total) by mouth daily. 30 capsule 5   No current facility-administered medications for this visit.    Allergies:   Review of patient's allergies indicates no known allergies.    Social History:  The patient  reports that she has never smoked. She has never used smokeless tobacco. She reports that she does not drink alcohol or use illicit drugs.   Family History:  The patient's family history includes Cancer in her brother, sister, sister, and sister; Heart attack in her father.    ROS:  Please see the history of present illness.   Otherwise, review of systems are positive for none.   All other systems are reviewed and negative.    PHYSICAL EXAM: VS:  BP 132/82 mmHg  Pulse 74  Ht 5\' 4"  (1.626 m)  Wt 142 lb 9.6 oz (64.683 kg)  BMI 24.47 kg/m2 , BMI Body mass index is 24.47 kg/(m^2). GEN: Well nourished, well developed, in no acute distress HEENT: normal Neck: no JVD, carotid bruits, or masses Cardiac: RRR; no murmurs, rubs, or gallops,no edema    Respiratory:  clear to auscultation bilaterally, normal work of breathing GI: soft, nontender, nondistended, + BS MS: no deformity or atrophy Skin: warm and dry, no rash Neuro:  Strength and sensation are intact Psych: euthymic mood, full affect   EKG:  EKG is not ordered today. The ekg ordered today demonstrates    Recent Labs: 05/11/2015: ALT 16; BUN 18; Creatinine, Ser 0.86; Hemoglobin 12.7; Platelets 230.0; Potassium 3.6; Sodium 139    Lipid Panel    Component Value Date/Time   CHOL 218* 05/11/2015 1104   TRIG 126.0 05/11/2015 1104   HDL 65.20 05/11/2015 1104   CHOLHDL  3 05/11/2015 1104   VLDL 25.2 05/11/2015 1104   LDLCALC 128* 05/11/2015 1104   LDLDIRECT 139.3 10/04/2013 1000      Wt Readings from Last 3 Encounters:  05/11/15 142 lb 9.6 oz (64.683 kg)  01/31/15 155 lb (70.308 kg)  09/22/14 158 lb 6.4 oz (71.85 kg)       ASSESSMENT AND PLAN:  1. 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 Hypercholesterolemia, 3. myasthenia gravis followed at [facility] 4. past history of spinal stenosis with chronic low back pain. Implantation of spinal cord stimulator at [facility] on 01/27/14 5. Insomnia 6. malaise and fatigue 7. Anemia. 8. Depression 9. frequent falls and poor balance  Disposition: Continue current medication.Checking lab work today including CBC lipid panel hepatic function panel and basal metabolic panel   Current medicines are reviewed at length with the patient today.  The patient does not have concerns regarding medicines.  The following changes have been made:  no change  Labs/ tests ordered today include:   Orders Placed This Encounter  Procedures  . CBC with Differential/Platelet   Disposition: Recheck in 4 months for office visit CBC hepatic function panel basal metabolic panel and lipid panel and EKG   Signed, Darlin Coco MD 05/11/2015 2:47 PM    [address], [place], [place]  [address] Phone: [phone]; Fax: [phone]

## 2015-05-11 NOTE — Progress Notes (Signed)
Quick Note:  Please report to patient. The recent labs are stable. Continue same medication and careful diet. ______ 

## 2015-06-09 ENCOUNTER — Other Ambulatory Visit: Payer: Self-pay | Admitting: Cardiology

## 2015-06-09 NOTE — Telephone Encounter (Signed)
Called medication refill to pharmacist at [facility], [address].

## 2015-06-09 NOTE — Telephone Encounter (Signed)
Okay to refill temazepam

## 2015-06-09 NOTE — Telephone Encounter (Signed)
Hey Caren Hazy, I noticed you are covering melindas messages so i am sending you this refill request for Dr. Mare Ferrari to refill since we dont typically refill these types of medications

## 2015-06-13 ENCOUNTER — Other Ambulatory Visit: Payer: Self-pay | Admitting: Cardiology

## 2015-08-15 ENCOUNTER — Other Ambulatory Visit: Payer: Self-pay | Admitting: Cardiology

## 2015-08-19 DIAGNOSIS — Z23 Encounter for immunization: Secondary | ICD-10-CM | POA: Diagnosis not present

## 2015-09-14 ENCOUNTER — Other Ambulatory Visit: Payer: Self-pay | Admitting: Cardiology

## 2015-09-14 DIAGNOSIS — G4700 Insomnia, unspecified: Secondary | ICD-10-CM

## 2015-09-14 NOTE — Telephone Encounter (Signed)
Pt requesting a refill. This is a controlled medication. Would you like to prescribe this medication? Please advise

## 2015-09-14 NOTE — Telephone Encounter (Signed)
OK to refill temazepam

## 2015-10-25 ENCOUNTER — Other Ambulatory Visit (INDEPENDENT_AMBULATORY_CARE_PROVIDER_SITE_OTHER): Payer: Medicare Other | Admitting: *Deleted

## 2015-10-25 ENCOUNTER — Encounter: Payer: Self-pay | Admitting: Cardiology

## 2015-10-25 ENCOUNTER — Ambulatory Visit (INDEPENDENT_AMBULATORY_CARE_PROVIDER_SITE_OTHER): Payer: Medicare Other | Admitting: Cardiology

## 2015-10-25 VITALS — BP 136/78 | HR 69 | Ht 65.0 in | Wt 142.0 lb

## 2015-10-25 DIAGNOSIS — G7000 Myasthenia gravis without (acute) exacerbation: Secondary | ICD-10-CM

## 2015-10-25 DIAGNOSIS — I119 Hypertensive heart disease without heart failure: Secondary | ICD-10-CM

## 2015-10-25 DIAGNOSIS — E7800 Pure hypercholesterolemia, unspecified: Secondary | ICD-10-CM | POA: Diagnosis not present

## 2015-10-25 DIAGNOSIS — R42 Dizziness and giddiness: Secondary | ICD-10-CM

## 2015-10-25 DIAGNOSIS — D485 Neoplasm of uncertain behavior of skin: Secondary | ICD-10-CM | POA: Diagnosis not present

## 2015-10-25 DIAGNOSIS — L821 Other seborrheic keratosis: Secondary | ICD-10-CM | POA: Diagnosis not present

## 2015-10-25 DIAGNOSIS — D1801 Hemangioma of skin and subcutaneous tissue: Secondary | ICD-10-CM | POA: Diagnosis not present

## 2015-10-25 DIAGNOSIS — L570 Actinic keratosis: Secondary | ICD-10-CM | POA: Diagnosis not present

## 2015-10-25 DIAGNOSIS — Z85828 Personal history of other malignant neoplasm of skin: Secondary | ICD-10-CM | POA: Diagnosis not present

## 2015-10-25 DIAGNOSIS — I491 Atrial premature depolarization: Secondary | ICD-10-CM | POA: Diagnosis not present

## 2015-10-25 LAB — CBC WITH DIFFERENTIAL/PLATELET
Basophils Absolute: 0.1 10*3/uL (ref 0.0–0.1)
Basophils Relative: 1 % (ref 0–1)
Eosinophils Absolute: 0.3 10*3/uL (ref 0.0–0.7)
Eosinophils Relative: 4 % (ref 0–5)
HCT: 37.4 % (ref 36.0–46.0)
HEMOGLOBIN: 12.5 g/dL (ref 12.0–15.0)
LYMPHS ABS: 1.7 10*3/uL (ref 0.7–4.0)
Lymphocytes Relative: 27 % (ref 12–46)
MCH: 31.8 pg (ref 26.0–34.0)
MCHC: 33.4 g/dL (ref 30.0–36.0)
MCV: 95.2 fL (ref 78.0–100.0)
MONOS PCT: 13 % — AB (ref 3–12)
MPV: 9.5 fL (ref 8.6–12.4)
Monocytes Absolute: 0.8 10*3/uL (ref 0.1–1.0)
NEUTROS ABS: 3.5 10*3/uL (ref 1.7–7.7)
NEUTROS PCT: 55 % (ref 43–77)
Platelets: 318 10*3/uL (ref 150–400)
RBC: 3.93 MIL/uL (ref 3.87–5.11)
RDW: 13.8 % (ref 11.5–15.5)
WBC: 6.4 10*3/uL (ref 4.0–10.5)

## 2015-10-25 LAB — BASIC METABOLIC PANEL
BUN: 17 mg/dL (ref 7–25)
CALCIUM: 10 mg/dL (ref 8.6–10.4)
CHLORIDE: 104 mmol/L (ref 98–110)
CO2: 24 mmol/L (ref 20–31)
CREATININE: 0.75 mg/dL (ref 0.60–0.88)
Glucose, Bld: 94 mg/dL (ref 65–99)
Potassium: 3.7 mmol/L (ref 3.5–5.3)
Sodium: 140 mmol/L (ref 135–146)

## 2015-10-25 LAB — HEPATIC FUNCTION PANEL
ALK PHOS: 70 U/L (ref 33–130)
ALT: 16 U/L (ref 6–29)
AST: 20 U/L (ref 10–35)
Albumin: 4.4 g/dL (ref 3.6–5.1)
BILIRUBIN DIRECT: 0.1 mg/dL (ref <=0.2)
BILIRUBIN INDIRECT: 0.4 mg/dL (ref 0.2–1.2)
BILIRUBIN TOTAL: 0.5 mg/dL (ref 0.2–1.2)
TOTAL PROTEIN: 7 g/dL (ref 6.1–8.1)

## 2015-10-25 LAB — LIPID PANEL
Cholesterol: 252 mg/dL — ABNORMAL HIGH (ref 125–200)
HDL: 71 mg/dL (ref >=46)
LDL Cholesterol: 160 mg/dL — ABNORMAL HIGH (ref <130)
TRIGLYCERIDES: 104 mg/dL (ref <150)
Total CHOL/HDL Ratio: 3.5 Ratio (ref <=5.0)
VLDL: 21 mg/dL (ref <30)

## 2015-10-25 NOTE — Addendum Note (Signed)
Addended by: Eulis Foster on: 10/25/2015 08:37 AM   Modules accepted: Orders

## 2015-10-25 NOTE — Patient Instructions (Addendum)
Medication Instructions:  Your physician recommends that you continue on your current medications as directed. Please refer to the Current Medication list given to you today.  Labwork: none  Testing/Procedures: none  Follow-Up: Your physician recommends that you schedule a follow-up appointment in: 4 months with fasting labs (lp/bmet/hfp) with Tera Helper NP    If you need a refill on your cardiac medications before your next appointment, please call your pharmacy.

## 2015-10-25 NOTE — Progress Notes (Signed)
Cardiology Office Note   Date:  10/25/2015   ID:  Tamara Gregory, DOB 07-28-1925, MRN [id number]  PCP:  No PCP Per Patient  Cardiologist: Darlin Coco MD  Chief Complaint  Patient presents with  . Hypertension      History of Present Illness: Tamara Gregory is a 81 y.o. female who presents for  Four-month follow-up visit This pleasant 81 year old woman is seen for a scheduled followup office visit. She has a past history of hypercholesterolemia and essential hypertension. She has a history of myasthenia gravis and is followed for that at [facility]. She has had previous back surgery by Dr. Gladstone Lighter. She has a known 4 cm fusiform ascending aortic aneurysm . When the patient was initially admitted for her back surgery she had some chest pain in the preoperative holding area and her back surgery had to be postponed while she underwent a cardiac workup. She had a Myoview stress test which showed no ischemia. Her back surgery was subsequently rescheduled and performed without incident. She made a good recovery from her back surgery. However she has continued to have back pain and for this reason she underwent a implantation of a spinal cord stimulator as an outpatient at [facility] outpatient facility on 01/27/2014. The patient remains on low-dose prednisone 5 mg daily for her myasthenia gravis.  The patient earlier this year was hospitalized overnight at [facility] in [facility] for chest pain. She had a dobutamine stress echo the following morning which showed no evidence of ischemia and she was released. She was admitted with bradycardia and her metoprolol was stopped and has not been restarted. She has had previous falls but has had no further falls since her last visit 8 months ago. At that time we did a chest x-ray to rule out rib fractures and there were no rib fracture seen. She is followed at the pain clinic at [facility]. . She has had diminished bladder control and has to wear depends. She has not been experiencing any chest pain or shortness of breath or palpitations. She continues to complain of lack of energy. She has been spending a lot of time down at [employer]. She came back for this examination   Past Medical History  Diagnosis Date  . Hypertension   . Hyperlipidemia   . Depression   . History of migraines   . Vitamin D deficiency   . GERD (gastroesophageal reflux disease)   . Headache(784.0)   . Arthritis   . Cancer (HCC)     hx of skin cancer   . Macular degeneration   . Myasthenia gravis ([place])   . Thoracic aneurysm     4.4cm; see CT done 01/28/12 and 01/22/13 in [facility].   [name] Spinal stenosis     Past Surgical History  Procedure Laterality Date  . Cholecystectomy    . US echocardiography  05/18/2007    EF 55-60%  . Abdominal hysterectomy    . Tonsillectomy    . Lumbar laminectomy/decompression microdiscectomy N/A 02/04/2013    Procedure: CENTRAL DECOMPRESSION/LUMBAR LAMINECTOMY L3-L4 AND L4-L5 2 LEVELS;  Surgeon: Tobi Bastos, MD;  Location: [facility];  Service: Orthopedics;  Laterality: N/A;     Current Outpatient Prescriptions  Medication Sig Dispense Refill  . amLODipine (NORVASC) 5 MG tablet Take 1/2 tablet by mouth daily    . aspirin EC 81 MG tablet Take 81 mg by mouth daily.     [name]  Calcium Carbonate (CALCIUM 500 PO) Take 1 capsule by mouth daily.     . cephALEXin (KEFLEX) 500 MG capsule Take 1 capsule (500 mg total) by mouth 4 (four) times daily. Take all of medicine and drink lots of fluids 20 capsule 0  . cholecalciferol (VITAMIN D) 1000 UNITS tablet Take 1,000 Units by mouth daily.     . citalopram (CELEXA) 40 MG tablet Take 40 mg by mouth daily.    . folic acid (FOLVITE) A999333 MCG tablet Take 400 mcg by mouth daily.    [name] gabapentin (NEURONTIN) 100 MG capsule Take 300 mg by mouth 4 (four) times daily. 1200 mg daily    . Glucosamine-Chondroit-Vit C-Mn  (GLUCOSAMINE 1500 COMPLEX PO) Take 1 tablet by mouth daily.     [name] latanoprost (XALATAN) 0.005 % ophthalmic solution Place 1 drop into the left eye at bedtime.  1  . losartan-hydrochlorothiazide (HYZAAR) 100-25 MG per tablet TAKE 1 TABLET EVERY DAY BEFORE BREAKFAST 90 tablet 1  . Multiple Vitamin (MULTIVITAMIN) tablet Take 1 tablet by mouth daily.     . Multiple Vitamins-Minerals (PRESERVISION AREDS) CAPS Take 2 capsules by mouth daily.    . Omega-3 Fatty Acids (FISH OIL) 435 MG CAPS Take 1 capsule by mouth daily.    [name] omeprazole (PRILOSEC) 20 MG capsule Take 20 mg by mouth daily.     . ondansetron (ZOFRAN) 4 MG tablet Take 1 tablet (4 mg total) by mouth every 8 (eight) hours as needed for nausea. 15 tablet 0  . predniSONE (DELTASONE) 10 MG tablet Take 10 mg by mouth daily with breakfast.    . temazepam (RESTORIL) 15 MG capsule Take 15 mg by mouth at bedtime as needed for sleep (sleep).    . tolterodine (DETROL LA) 2 MG 24 hr capsule take 1 capsule by mouth once daily 30 capsule 5   No current facility-administered medications for this visit.    Allergies:   Review of patient's allergies indicates no known allergies.    Social History:  The patient  reports that she has never smoked. She has never used smokeless tobacco. She reports that she does not drink alcohol or use illicit drugs.   Family History:  The patient's family history includes Cancer in her brother, sister, sister, and sister; Heart attack in her father.    ROS:  Please see the history of present illness.   Otherwise, review of systems are positive for none.   All other systems are reviewed and negative.    PHYSICAL EXAM: VS:  BP 136/78 mmHg  Pulse 69  Ht 5\' 5"  (1.651 m)  Wt 142 lb (64.411 kg)  BMI 23.63 kg/m2 , BMI Body mass index is 23.63 kg/(m^2). GEN: Well nourished, well developed, in no acute distress HEENT: normal Neck: no JVD, carotid bruits, or masses Cardiac: RRR; no murmurs, rubs, or gallops,no edema    Respiratory:  clear to auscultation bilaterally, normal work of breathing GI: soft, nontender, nondistended, + BS MS: no deformity or atrophy Skin: warm and dry, no rash Neuro:  Strength and sensation are intact Psych: euthymic mood, full affect   EKG:  EKG is ordered today. The ekg ordered today demonstrates  Normal sinus rhythm with occasional PACs. Nonspecific T-wave flattening. Low voltage.   Recent Labs: 05/11/2015: ALT 16; BUN 18; Creatinine, Ser 0.86; Hemoglobin 12.7; Platelets 230.0; Potassium 3.6; Sodium 139    Lipid Panel    Component Value Date/Time   CHOL 218* 05/11/2015 1104   TRIG 126.0 05/11/2015 1104  HDL 65.20 05/11/2015 1104   CHOLHDL 3 05/11/2015 1104   VLDL 25.2 05/11/2015 1104   LDLCALC 128* 05/11/2015 1104   LDLDIRECT 139.3 10/04/2013 1000      Wt Readings from Last 3 Encounters:  10/25/15 142 lb (64.411 kg)  05/11/15 142 lb 9.6 oz (64.683 kg)  01/31/15 155 lb (70.308 kg)        ASSESSMENT AND PLAN:  1. 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 Hypercholesterolemia, 3. myasthenia gravis followed at [facility] 4. past history of spinal stenosis with chronic low back pain. Implantation of spinal cord stimulator at [facility] annex on 01/27/14 5. Insomnia 6. malaise and fatigue 7. Anemia. 8. Depression 9. frequent falls and poor balance 10.  History of fusiform aneurysm of ascending aorta 4 cm  Current medicines are reviewed at length with the patient today.  The patient does not have concerns regarding medicines.  The following changes have been made:  no change  Labs/ tests ordered today include:   Orders Placed This Encounter  Procedures  . EKG 12-Lead    disposition: continue current medication. Lab work today is pending.  Recheck in 4 months for office visit with Cecille Rubin and with follow-up fasting lab work including lipid panel hepatic function panel and basal metabolic panel.   Berna Spare MD 10/25/2015 1:14 PM    [facility] [address], [place], [place]  [address] Phone: [phone]; Fax: [phone]

## 2015-10-26 ENCOUNTER — Other Ambulatory Visit: Payer: Self-pay | Admitting: Cardiology

## 2015-10-31 ENCOUNTER — Telehealth: Payer: Self-pay

## 2015-10-31 DIAGNOSIS — E7800 Pure hypercholesterolemia, unspecified: Secondary | ICD-10-CM

## 2015-10-31 DIAGNOSIS — Z79899 Other long term (current) drug therapy: Secondary | ICD-10-CM

## 2015-10-31 MED ORDER — ROSUVASTATIN CALCIUM 5 MG PO TABS: 5.0000 mg | ORAL_TABLET | Freq: Every day | ORAL | Status: DC

## 2015-10-31 NOTE — Telephone Encounter (Signed)
Called and talked to patient's husband (DPR) with lab results. Per Dr. Mare Ferrari, Lab okay except LDL cholesterol is higher at 160. LFTs are normal. No anemia. I would like her to try low dose crestor 5 mg to lower cholesterol. Recheck LP and HFP in 2 months. Patient's husband verbalized understanding.

## 2015-11-14 DIAGNOSIS — H353210 Exudative age-related macular degeneration, right eye, stage unspecified: Secondary | ICD-10-CM | POA: Diagnosis not present

## 2015-11-14 DIAGNOSIS — H353190 Nonexudative age-related macular degeneration, unspecified eye, stage unspecified: Secondary | ICD-10-CM | POA: Diagnosis not present

## 2015-11-14 DIAGNOSIS — Z961 Presence of intraocular lens: Secondary | ICD-10-CM | POA: Diagnosis not present

## 2015-11-14 DIAGNOSIS — H401121 Primary open-angle glaucoma, left eye, mild stage: Secondary | ICD-10-CM | POA: Diagnosis not present

## 2015-11-14 DIAGNOSIS — H35373 Puckering of macula, bilateral: Secondary | ICD-10-CM | POA: Diagnosis not present

## 2015-11-15 DIAGNOSIS — M5442 Lumbago with sciatica, left side: Secondary | ICD-10-CM | POA: Diagnosis not present

## 2015-11-15 DIAGNOSIS — M4120 Other idiopathic scoliosis, site unspecified: Secondary | ICD-10-CM | POA: Diagnosis not present

## 2015-11-15 DIAGNOSIS — G8929 Other chronic pain: Secondary | ICD-10-CM | POA: Diagnosis not present

## 2015-11-15 DIAGNOSIS — M5417 Radiculopathy, lumbosacral region: Secondary | ICD-10-CM | POA: Diagnosis not present

## 2015-11-30 ENCOUNTER — Other Ambulatory Visit: Payer: Self-pay | Admitting: Cardiology

## 2015-11-30 DIAGNOSIS — Z9689 Presence of other specified functional implants: Secondary | ICD-10-CM | POA: Diagnosis not present

## 2015-11-30 DIAGNOSIS — M5417 Radiculopathy, lumbosacral region: Secondary | ICD-10-CM | POA: Diagnosis not present

## 2015-11-30 DIAGNOSIS — G8929 Other chronic pain: Secondary | ICD-10-CM | POA: Diagnosis not present

## 2015-12-08 ENCOUNTER — Telehealth: Payer: Self-pay | Admitting: *Deleted

## 2015-12-08 NOTE — Telephone Encounter (Signed)
CALLED NEEDING REFILLS, [employer] REFUSED TO FILL MEDICATION, Tamara Gregory HAS REFILLS ON FILE TILL MARCH 2017, WILL CALL [employer] , RESOLVE ISSUE AND CALL PT BACK WITH FINDINGS.  SPOKE WITH [name], SHE WILL SEND [facility], THEY COULDN'T FIND THE REFILL WE SENT, I DID A VERBAL WITH [name]  AND THEY WILL SEND TO THE PT.  WILL CALL & INFORM PT.   SPOKE W/PT AND THEY VERBALIZED UNDERSTANDING.

## 2015-12-21 ENCOUNTER — Encounter: Payer: Self-pay | Admitting: Physical Therapy

## 2015-12-21 NOTE — Therapy (Signed)
[facility] [address] [place], [place], [address] Phone: [phone]   Fax:  [phone]  Patient Details  Name: Tamara Gregory MRN: [id number] Date of Birth: Jan 01, 1925 Referring Provider:  No ref. provider found  Encounter Date: 12/21/2015  PHYSICAL THERAPY DISCHARGE SUMMARY  Visits from Start of Care: 9  Current functional level related to goals / functional outcomes:     PT Long Term Goals - 12/06/14 1639    PT LONG TERM GOAL #1   Title verbalize understanding of fall prevention strategies (12/12/14)   Time 8   Period Weeks   Status On-going   PT LONG TERM GOAL #2   Title improve BERG balance score to > 40/56 for improved mobility (12/12/14)   Time 8   Period Weeks   Status Achieved   PT LONG TERM GOAL #3   Title improve timed up and go to < 35 sec with LRAD for improved mobility (12/12/14)   Time 8   Period Weeks   Status Achieved   PT LONG TERM GOAL #4   Title ambulate > 300' with LRAD modified independent for improved mobility (12/12/14)   Time 8   Period Weeks   Status On-going   PT LONG TERM GOAL #5   Title improve gait velocity to > 1.8 ft/sec for improved mobility and decreased fall risk (12/12/14)   Time 8   Period Weeks   Status On-going    Goals not fully addressed, due to pt not returning after last visit.   Remaining deficits: Balance, strength (was improving)   Education / Equipment: HEP  Plan: Patient agrees to discharge.  Patient goals were not met. Patient is being discharged due to not returning since the last visit.  ?????      Flecia Shutter W. 12/21/2015, 1:53 PM Frazier Butt., PT [facility] [address] [place], [place], [address] Phone: [phone]   Fax:  [phone]

## 2015-12-25 ENCOUNTER — Other Ambulatory Visit: Payer: Self-pay | Admitting: Cardiology

## 2015-12-25 DIAGNOSIS — F32A Depression, unspecified: Secondary | ICD-10-CM

## 2015-12-25 DIAGNOSIS — F329 Major depressive disorder, single episode, unspecified: Secondary | ICD-10-CM

## 2015-12-25 NOTE — Telephone Encounter (Signed)
The prednisone is for her myasthenia gravis. She should get it from her Neurologist at [facility].

## 2015-12-26 ENCOUNTER — Other Ambulatory Visit: Payer: Medicare Other

## 2015-12-27 ENCOUNTER — Other Ambulatory Visit (INDEPENDENT_AMBULATORY_CARE_PROVIDER_SITE_OTHER): Payer: Medicare Other | Admitting: *Deleted

## 2015-12-27 DIAGNOSIS — E7800 Pure hypercholesterolemia, unspecified: Secondary | ICD-10-CM | POA: Diagnosis not present

## 2015-12-27 DIAGNOSIS — Z79899 Other long term (current) drug therapy: Secondary | ICD-10-CM | POA: Diagnosis not present

## 2015-12-27 LAB — LIPID PANEL
CHOLESTEROL: 202 mg/dL — AB (ref 125–200)
HDL: 79 mg/dL (ref >=46)
LDL Cholesterol: 96 mg/dL (ref <130)
Total CHOL/HDL Ratio: 2.6 Ratio (ref <=5.0)
Triglycerides: 133 mg/dL (ref <150)
VLDL: 27 mg/dL (ref <30)

## 2015-12-27 LAB — HEPATIC FUNCTION PANEL
ALT: 18 U/L (ref 6–29)
AST: 20 U/L (ref 10–35)
Albumin: 4.3 g/dL (ref 3.6–5.1)
Alkaline Phosphatase: 56 U/L (ref 33–130)
BILIRUBIN DIRECT: 0.2 mg/dL (ref <=0.2)
Indirect Bilirubin: 0.4 mg/dL (ref 0.2–1.2)
TOTAL PROTEIN: 7 g/dL (ref 6.1–8.1)
Total Bilirubin: 0.6 mg/dL (ref 0.2–1.2)

## 2015-12-27 NOTE — Addendum Note (Signed)
Addended by: Eulis Foster on: 12/27/2015 07:55 AM   Modules accepted: Orders

## 2015-12-27 NOTE — Progress Notes (Signed)
Quick Note:  Please report to patient. The recent labs are stable. Continue same medication and careful diet. Good response to crestor ______

## 2016-02-28 ENCOUNTER — Encounter: Payer: Self-pay | Admitting: Nurse Practitioner

## 2016-02-28 ENCOUNTER — Ambulatory Visit (INDEPENDENT_AMBULATORY_CARE_PROVIDER_SITE_OTHER): Payer: Medicare Other | Admitting: Nurse Practitioner

## 2016-02-28 ENCOUNTER — Other Ambulatory Visit: Payer: Medicare Other

## 2016-02-28 VITALS — BP 110/80 | HR 66 | Ht 64.0 in | Wt 156.4 lb

## 2016-02-28 DIAGNOSIS — R29898 Other symptoms and signs involving the musculoskeletal system: Secondary | ICD-10-CM | POA: Diagnosis not present

## 2016-02-28 DIAGNOSIS — I10 Essential (primary) hypertension: Secondary | ICD-10-CM

## 2016-02-28 DIAGNOSIS — G7000 Myasthenia gravis without (acute) exacerbation: Secondary | ICD-10-CM

## 2016-02-28 DIAGNOSIS — E7800 Pure hypercholesterolemia, unspecified: Secondary | ICD-10-CM | POA: Diagnosis not present

## 2016-02-28 DIAGNOSIS — E785 Hyperlipidemia, unspecified: Secondary | ICD-10-CM | POA: Diagnosis not present

## 2016-02-28 LAB — CBC
HCT: 34.3 % — ABNORMAL LOW (ref 36.0–46.0)
Hemoglobin: 11.5 g/dL — ABNORMAL LOW (ref 12.0–15.0)
MCH: 31.5 pg (ref 26.0–34.0)
MCHC: 33.5 g/dL (ref 30.0–36.0)
MCV: 94 fL (ref 78.0–100.0)
MPV: 9.4 fL (ref 8.6–12.4)
Platelets: 236 10*3/uL (ref 150–400)
RBC: 3.65 MIL/uL — ABNORMAL LOW (ref 3.87–5.11)
RDW: 14.3 % (ref 11.5–15.5)
WBC: 5.3 10*3/uL (ref 4.0–10.5)

## 2016-02-28 LAB — LIPID PANEL
Cholesterol: 174 mg/dL (ref 125–200)
HDL: 77 mg/dL (ref >=46)
LDL Cholesterol: 78 mg/dL (ref <130)
Total CHOL/HDL Ratio: 2.3 Ratio (ref <=5.0)
Triglycerides: 97 mg/dL (ref <150)
VLDL: 19 mg/dL (ref <30)

## 2016-02-28 LAB — HEPATIC FUNCTION PANEL
ALT: 17 U/L (ref 6–29)
AST: 19 U/L (ref 10–35)
Albumin: 4.2 g/dL (ref 3.6–5.1)
Alkaline Phosphatase: 48 U/L (ref 33–130)
Bilirubin, Direct: 0.1 mg/dL (ref <=0.2)
Indirect Bilirubin: 0.6 mg/dL (ref 0.2–1.2)
Total Bilirubin: 0.7 mg/dL (ref 0.2–1.2)
Total Protein: 6.5 g/dL (ref 6.1–8.1)

## 2016-02-28 LAB — BASIC METABOLIC PANEL
BUN: 15 mg/dL (ref 7–25)
CO2: 25 mmol/L (ref 20–31)
Calcium: 9.3 mg/dL (ref 8.6–10.4)
Chloride: 106 mmol/L (ref 98–110)
Creat: 0.96 mg/dL — ABNORMAL HIGH (ref 0.60–0.88)
Glucose, Bld: 90 mg/dL (ref 65–99)
Potassium: 4 mmol/L (ref 3.5–5.3)
Sodium: 143 mmol/L (ref 135–146)

## 2016-02-28 NOTE — Progress Notes (Signed)
CARDIOLOGY OFFICE NOTE  Date:  02/28/2016    Tamara Gregory Date of Birth: Sep 13, 1925 Medical Record [id number]  PCP:  No PCP Per Patient  Cardiologist:  Former patient of Dr. Sherryl Barters.     Chief Complaint  Patient presents with  . Hyperlipidemia  . Hypertension    4 month check - former patient of Dr. Sherryl Barters.     History of Present Illness: Tamara Gregory is a 82 y.o. female who presents today for a 4 month check. Former patient of Dr. Sherryl Barters.   She has a past history of HLD, HTN and myasthenia gravis and is followed for that at [facility]. She has had previous back surgery by Dr. Gladstone Lighter. She has a known 4 cm fusiform ascending aortic aneurysm . When the patient was initially admitted for her back surgery she had some chest pain in the preoperative holding area and her back surgery had to be postponed while she underwent a cardiac workup. She had a Myoview stress test which showed no ischemia. Her back surgery was subsequently rescheduled and performed without incident. She made a good recovery from her back surgery. However she has continued to have back pain and for this reason she underwent a implantation of a spinal cord stimulator as an outpatient at [facility] outpatient facility on 01/27/2014. The patient remains on low-dose prednisone 5 mg daily for her myasthenia gravis.   In March of 2015 she was hospitalized overnight at [facility] in [facility] for chest pain. She had a dobutamine stress echo the following morning which showed no evidence of ischemia and she was released. She was admitted with bradycardia and her metoprolol was stopped and has not been restarted.  Last visit back in November and she was felt to be doing well.   Comes back today. Here with her husband who is also being seen. She is mostly limited by her back pain - going to [place] later today. Taking at least 2 sometimes 3 Excedrins  on a daily basis. Some belly pain. Husband notes stool has been dark. She has early satiety. No chest pain. Breathing is good but she is more sedentary and will have some DOE with much activity. [place]. Has PCP arranged for June.   Past Medical History  Diagnosis Date  . Hypertension   . Hyperlipidemia   . Depression   . History of migraines   . Vitamin D deficiency   . GERD (gastroesophageal reflux disease)   . Headache(784.0)   . Arthritis   . Cancer (HCC)     hx of skin cancer   . Macular degeneration   . Myasthenia gravis ([place])   . Thoracic aneurysm     4.4cm; see CT done 01/28/12 and 01/22/13 in [facility].   [name] Spinal stenosis     Past Surgical History  Procedure Laterality Date  . Cholecystectomy    . US echocardiography  05/18/2007    EF 55-60%  . Abdominal hysterectomy    . Tonsillectomy    . Lumbar laminectomy/decompression microdiscectomy N/A 02/04/2013    Procedure: CENTRAL DECOMPRESSION/LUMBAR LAMINECTOMY L3-L4 AND L4-L5 2 LEVELS;  Surgeon: Tobi Bastos, MD;  Location: [facility];  Service: Orthopedics;  Laterality: N/A;     Medications: Current Outpatient Prescriptions  Medication Sig Dispense Refill  . amLODipine (NORVASC) 5 MG tablet Take 1/2 tablet by mouth daily    . aspirin  EC 81 MG tablet Take 81 mg by mouth daily.     . Calcium Carbonate (CALCIUM 500 PO) Take 1 capsule by mouth daily.     . cephALEXin (KEFLEX) 500 MG capsule Take 1 capsule (500 mg total) by mouth 4 (four) times daily. Take all of medicine and drink lots of fluids 20 capsule 0  . cholecalciferol (VITAMIN D) 1000 UNITS tablet Take 1,000 Units by mouth daily.     . citalopram (CELEXA) 40 MG tablet TAKE 1 TABLET EVERY DAY BEFORE BREAKFAST 90 tablet 3  . folic acid (FOLVITE) A999333 MCG tablet Take 400 mcg by mouth daily.    [name] gabapentin (NEURONTIN) 100 MG capsule Take 300 mg by mouth 4 (four) times daily. 1200 mg daily    . Glucosamine-Chondroit-Vit C-Mn  (GLUCOSAMINE 1500 COMPLEX PO) Take 1 tablet by mouth daily.     [name] latanoprost (XALATAN) 0.005 % ophthalmic solution Place 1 drop into the left eye at bedtime.  1  . losartan-hydrochlorothiazide (HYZAAR) 100-25 MG per tablet TAKE 1 TABLET EVERY DAY BEFORE BREAKFAST 90 tablet 1  . Multiple Vitamin (MULTIVITAMIN) tablet Take 1 tablet by mouth daily.     . Multiple Vitamins-Minerals (PRESERVISION AREDS) CAPS Take 2 capsules by mouth daily.    . Omega-3 Fatty Acids (FISH OIL) 435 MG CAPS Take 1 capsule by mouth daily.    [name] omeprazole (PRILOSEC) 20 MG capsule Take 20 mg by mouth daily.     . ondansetron (ZOFRAN) 4 MG tablet Take 1 tablet (4 mg total) by mouth every 8 (eight) hours as needed for nausea. 15 tablet 0  . predniSONE (DELTASONE) 5 MG tablet Take 5 mg by mouth daily with breakfast.    . rosuvastatin (CRESTOR) 5 MG tablet Take 1 tablet (5 mg total) by mouth daily. 90 tablet 3  . temazepam (RESTORIL) 15 MG capsule Take 15 mg by mouth at bedtime as needed for sleep (sleep).    . tolterodine (DETROL LA) 2 MG 24 hr capsule take 1 capsule by mouth once daily 30 capsule 11   No current facility-administered medications for this visit.    Allergies: No Known Allergies  Social History: The patient  reports that she has never smoked. She has never used smokeless tobacco. She reports that she does not drink alcohol or use illicit drugs.   Family History: The patient's family history includes Cancer in her brother, sister, sister, and sister; Heart attack in her father.   Review of Systems: Please see the history of present illness.   Otherwise, the review of systems is positive for none.   All other systems are reviewed and negative.   Physical Exam: VS:  BP 110/80 mmHg  Pulse 66  Ht 5\' 4"  (1.626 m)  Wt 156 lb 6.4 oz (70.943 kg)  BMI 26.83 kg/m2  SpO2 95% .  BMI Body mass index is 26.83 kg/(m^2).  Wt Readings from Last 3 Encounters:  02/28/16 156 lb 6.4 oz (70.943 kg)  10/25/15 142 lb  (64.411 kg)  05/11/15 142 lb 9.6 oz (64.683 kg)    General: Pleasant. Elderly female. Kyphotic. She is alert and in no acute distress.  HEENT: Normal. Neck: Supple, no JVD, carotid bruits, or masses noted.  Cardiac: Regular rate and rhythm. Soft outflow murmur.  No edema.  Respiratory:  Lungs are clear to auscultation bilaterally with normal work of breathing.  GI: Soft and nontender.  MS: No deformity or atrophy. Gait and ROM intact. Skin: Warm and dry. Color is  normal.  Neuro:  Strength and sensation are intact and no gross focal deficits noted.  Psych: Alert, appropriate and with normal affect.   LABORATORY DATA:  EKG:  EKG is not ordered today.  Lab Results  Component Value Date   WBC 6.4 10/25/2015   HGB 12.5 10/25/2015   HCT 37.4 10/25/2015   PLT 318 10/25/2015   GLUCOSE 94 10/25/2015   CHOL 202* 12/27/2015   TRIG 133 12/27/2015   HDL 79 12/27/2015   LDLDIRECT 139.3 10/04/2013   LDLCALC 96 12/27/2015   ALT 18 12/27/2015   AST 20 12/27/2015   NA 140 10/25/2015   K 3.7 10/25/2015   CL 104 10/25/2015   CREATININE 0.75 10/25/2015   BUN 17 10/25/2015   CO2 24 10/25/2015   TSH 4.36 10/04/2013   INR 1.01 01/21/2013    BNP (last 3 results) No results for input(s): BNP in the last 8760 hours.  ProBNP (last 3 results) No results for input(s): PROBNP in the last 8760 hours.   Other Studies Reviewed Today:  Echo Study Conclusions from 2014  - Left ventricle: The cavity size was normal. Wall thickness was increased in a pattern of moderate LVH. There was mild focal basal hypertrophy of the septum. Systolic function was normal. The estimated ejection fraction was in the range of 55% to 60%. Wall motion was normal; there were no regional wall motion abnormalities. Doppler parameters are consistent with abnormal left ventricular relaxation (grade 1 diastolic dysfunction). - Aortic valve: Valve mobility was restricted. There was mild stenosis. Mild  regurgitation. Valve area: 1.6cm^2(VTI). Valve area: 1.48cm^2 (Vmax).  CT Angio CHEST IMPRESSION FROM 01/2013: Unchanged thoracic aneurysm with maximal measurement 44 mm. Severe atherosclerosis with ulcerated plaque along the arch but no acute abnormalities.   Original Report Authenticated By: Dereck Ligas, M.D.  Assessment/Plan: 1. 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   2. Hypercholesterolemia - rechecking labs today.   3. Myasthenia gravis followed at [facility]  4. Past history of spinal stenosis with chronic low back pain. Implantation of spinal cord stimulator at [facility] annex on 01/27/14 - seeing provider at [facility] today.   5.  Frequent falls and poor balance  6. History of fusiform aneurysm of ascending aorta 4 cm - last measured in 2014. I would favor conservative management.   7. Abdominal pain - ?PUD - needs labs today. May need to see GI.   Current medicines are reviewed with the patient today.  The patient does not have concerns regarding medicines other than what has been noted above.  The following changes have been made:  See above.  Labs/ tests ordered today include:    Orders Placed This Encounter  Procedures  . Basic metabolic panel  . CBC  . Hepatic function panel  . Lipid panel     Disposition:   FU with Dr. Acie Fredrickson in 6 months.    Patient is agreeable to this plan and will call if any problems develop in the interim.   Signed: Burtis Junes, RN, ANP-C 02/28/2016 9:15 AM  [facility] [address] [place], [place]  [address] Phone: [phone] Fax: [phone]

## 2016-02-28 NOTE — Patient Instructions (Addendum)
We will be checking the following labs today - BMET, CBC, lipids and HPF - go by my orders   Medication Instructions:    Continue with your current medicines.     Testing/Procedures To Be Arranged:  N/A  Follow-Up:   See Dr. Acie Fredrickson in 6 months    Other Special Instructions:   N/A    If you need a refill on your cardiac medications before your next appointment, please call your pharmacy.   Call the [place] office at [phone] if you have any questions, problems or concerns.

## 2016-03-22 ENCOUNTER — Other Ambulatory Visit: Payer: Self-pay | Admitting: *Deleted

## 2016-03-22 NOTE — Telephone Encounter (Signed)
Spoke with patients husband and they were not able to get a new patient appointment with PCP until July Advised that Dr Acie Fredrickson was strictly cardiology and did not fill Rx's like Temazepam  He asked that I please check with him anyway Will forward to Dr Acie Fredrickson for review

## 2016-03-22 NOTE — Telephone Encounter (Signed)
Per message left by patients husband, the patient is at [facility] and needs a refill on temazepam 15mg  called [facility] [phone]. Please advise. Thanks, MI

## 2016-03-25 MED ORDER — TEMAZEPAM 15 MG PO CAPS: 15.0000 mg | ORAL_CAPSULE | Freq: Every evening | ORAL | Status: DC | PRN

## 2016-03-25 NOTE — Telephone Encounter (Signed)
Advised husband and called to [facility] as requested

## 2016-03-25 NOTE — Telephone Encounter (Signed)
I do not prescribe these meds on any long term basis. I will give her 15 tabs so that she does not stop it abruptly. She  Should spread these out . She will need to contact her medical doctor for additional refills

## 2016-05-06 ENCOUNTER — Encounter: Payer: Self-pay | Admitting: Internal Medicine

## 2016-05-06 ENCOUNTER — Ambulatory Visit (INDEPENDENT_AMBULATORY_CARE_PROVIDER_SITE_OTHER): Payer: Medicare Other | Admitting: Internal Medicine

## 2016-05-06 ENCOUNTER — Other Ambulatory Visit (INDEPENDENT_AMBULATORY_CARE_PROVIDER_SITE_OTHER): Payer: Medicare Other

## 2016-05-06 VITALS — BP 134/88 | HR 80 | Temp 98.4°F | Resp 18 | Ht 62.5 in | Wt 157.0 lb

## 2016-05-06 DIAGNOSIS — K219 Gastro-esophageal reflux disease without esophagitis: Secondary | ICD-10-CM | POA: Insufficient documentation

## 2016-05-06 DIAGNOSIS — I119 Hypertensive heart disease without heart failure: Secondary | ICD-10-CM

## 2016-05-06 DIAGNOSIS — H9190 Unspecified hearing loss, unspecified ear: Secondary | ICD-10-CM | POA: Diagnosis not present

## 2016-05-06 DIAGNOSIS — F32A Depression, unspecified: Secondary | ICD-10-CM

## 2016-05-06 DIAGNOSIS — F329 Major depressive disorder, single episode, unspecified: Secondary | ICD-10-CM

## 2016-05-06 DIAGNOSIS — K921 Melena: Secondary | ICD-10-CM

## 2016-05-06 DIAGNOSIS — M4806 Spinal stenosis, lumbar region: Secondary | ICD-10-CM

## 2016-05-06 DIAGNOSIS — N3946 Mixed incontinence: Secondary | ICD-10-CM

## 2016-05-06 DIAGNOSIS — M48062 Spinal stenosis, lumbar region with neurogenic claudication: Secondary | ICD-10-CM

## 2016-05-06 LAB — CBC WITH DIFFERENTIAL/PLATELET
BASOS PCT: 0.4 % (ref 0.0–3.0)
Basophils Absolute: 0 10*3/uL (ref 0.0–0.1)
EOS PCT: 0.1 % (ref 0.0–5.0)
Eosinophils Absolute: 0 10*3/uL (ref 0.0–0.7)
HCT: 37.1 % (ref 36.0–46.0)
Hemoglobin: 12.3 g/dL (ref 12.0–15.0)
Lymphocytes Relative: 13.3 % (ref 12.0–46.0)
Lymphs Abs: 1.4 10*3/uL (ref 0.7–4.0)
MCHC: 33.3 g/dL (ref 30.0–36.0)
MCV: 95.9 fl (ref 78.0–100.0)
MONO ABS: 0.9 10*3/uL (ref 0.1–1.0)
Monocytes Relative: 8.6 % (ref 3.0–12.0)
NEUTROS ABS: 7.9 10*3/uL — AB (ref 1.4–7.7)
NEUTROS PCT: 77.6 % — AB (ref 43.0–77.0)
Platelets: 260 10*3/uL (ref 150.0–400.0)
RBC: 3.87 Mil/uL (ref 3.87–5.11)
RDW: 14.3 % (ref 11.5–15.5)
WBC: 10.2 10*3/uL (ref 4.0–10.5)

## 2016-05-06 LAB — COMPREHENSIVE METABOLIC PANEL
ALT: 19 U/L (ref 0–35)
AST: 18 U/L (ref 0–37)
Albumin: 4.7 g/dL (ref 3.5–5.2)
Alkaline Phosphatase: 50 U/L (ref 39–117)
BUN: 25 mg/dL — AB (ref 6–23)
CHLORIDE: 101 meq/L (ref 96–112)
CO2: 33 meq/L — AB (ref 19–32)
CREATININE: 0.87 mg/dL (ref 0.40–1.20)
Calcium: 10.4 mg/dL (ref 8.4–10.5)
GFR: 65.12 mL/min (ref >60.00)
Glucose, Bld: 109 mg/dL — ABNORMAL HIGH (ref 70–99)
POTASSIUM: 4.2 meq/L (ref 3.5–5.1)
SODIUM: 139 meq/L (ref 135–145)
Total Bilirubin: 0.4 mg/dL (ref 0.2–1.2)
Total Protein: 7.1 g/dL (ref 6.0–8.3)

## 2016-05-06 LAB — IRON: Iron: 69 ug/dL (ref 42–145)

## 2016-05-06 LAB — FERRITIN: FERRITIN: 23.2 ng/mL (ref 10.0–291.0)

## 2016-05-06 MED ORDER — DULOXETINE HCL 20 MG PO CPEP: 20.0000 mg | ORAL_CAPSULE | Freq: Every day | ORAL | Status: DC

## 2016-05-06 MED ORDER — OMEPRAZOLE 40 MG PO CPDR: 40.0000 mg | DELAYED_RELEASE_CAPSULE | Freq: Every day | ORAL | Status: DC

## 2016-05-06 NOTE — Assessment & Plan Note (Addendum)
Not controlled Taking celexa 40 mg daily  - not helping - taper off Start cymbalta once off celexa - hopefully that will help with some of her chronic back pain

## 2016-05-06 NOTE — Assessment & Plan Note (Signed)
Urology started detrol years ago - was helpful, but less helpful now Will try increasing to 4 mg daily If not effective - can consider myrbetriq

## 2016-05-06 NOTE — Assessment & Plan Note (Signed)
Not controlled, worse after stopping omeprazole 20 mg daily Start omeprazole 40 mg daily -- if not controlled will add zantac at night Will refer to GI - possible gastritis, ulcer, esophageal stricture - possible UGIB Check cbc, iron levels otc stool test for blood was negative

## 2016-05-06 NOTE — Assessment & Plan Note (Addendum)
Spinal stimulator in place - not working well Daily pain, worse with activity - ok with sitting Will follow up with representative for spinal stimulator May need pain management Advised her to take tylenol - not advil given GERD, stomach problems

## 2016-05-06 NOTE — Progress Notes (Signed)
Subjective:    Patient ID: Tamara Gregory, female    DOB: 12-07-1924, 82 y.o.   MRN: [id number]  HPI She is here to establish with a new pcp.  Her husband is with her today.   Myasthenia gravis: She is currently not following with anyone.  She is taking prednisone 5 mg daily and there are no symptoms.  Her neurologist advised her to follow up as needed.  Hypertension: She is taking her medication daily. She is not compliant with a low sodium diet.  She denies chest pain, palpitations, edema, shortness of breath and lightheadedness. She is not exercising regularly - stretches at night, no cardio.  She does not monitor her blood pressure at home.    Spinal stenosis:  She has spinal stenosis and chronic lower back pain.  She is very sedentary because at rest she has no back pain and she does not want to feel pain.  She has a stimulator in her back now but it is not helping.  She has had injections in the past and they have not helped. She takes advil as needed.    Stomach pain, GERD:  When she eats some foods she has pain in her lower espohagus and it radiates into her stomach.  Steak is one food that causes these symptoms.  She is drinking a lot of ensure now.  Smaller meals helps. She has GERD symptoms nightly.  She takes tums when she has gerd.  She was on prilosec in the past, but started to have black stool and stopped the medication. Her GERD has gotten worse.  They checked her stool with an otc kit to detect blood and it was negative.      Depression:    She is taking celexa daily as prescribed.  Her depression is not controlled.  She denies trying any other medication in the past.  She is willing to try a different medication.    Difficulty sleeping:  She often wakes up due to GERD.  She wakes up, reads a little and then goes back to sleep.   Overactive bladder, incontinence:  She is taking detrol.  This was started by urology.    She has urgency incontinence .  She has some mild stress  incontinece.    Medications and allergies reviewed with patient and updated if appropriate.  Patient Active Problem List   Diagnosis Date Noted  . Low hemoglobin 09/22/2014  . Insomnia 07/26/2014  . Malaise and fatigue 10/04/2013  . PAC (premature atrial contraction) 10/04/2013  . Depression (emotion) 02/15/2013  . Spinal stenosis, lumbar region, with neurogenic claudication 02/04/2013  . Myasthenia gravis 03/17/2012  . Pure hypercholesterolemia 09/05/2011  . Benign hypertensive heart disease without heart failure 09/05/2011  . Osteoarthritis 09/05/2011    Current Outpatient Prescriptions on File Prior to Visit  Medication Sig Dispense Refill  . amLODipine (NORVASC) 5 MG tablet Take 1/2 tablet by mouth daily    . aspirin EC 81 MG tablet Take 81 mg by mouth daily.     . Calcium Carbonate (CALCIUM 500 PO) Take 1 capsule by mouth daily.     . cephALEXin (KEFLEX) 500 MG capsule Take 1 capsule (500 mg total) by mouth 4 (four) times daily. Take all of medicine and drink lots of fluids 20 capsule 0  . cholecalciferol (VITAMIN D) 1000 UNITS tablet Take 1,000 Units by mouth daily.     . citalopram (CELEXA) 40 MG tablet TAKE 1 TABLET EVERY DAY BEFORE BREAKFAST  90 tablet 3  . folic acid (FOLVITE) 671 MCG tablet Take 400 mcg by mouth daily.    . Glucosamine-Chondroit-Vit C-Mn (GLUCOSAMINE 1500 COMPLEX PO) Take 1 tablet by mouth daily.     [name] latanoprost (XALATAN) 0.005 % ophthalmic solution Place 1 drop into the left eye at bedtime.  1  . losartan-hydrochlorothiazide (HYZAAR) 100-25 MG per tablet TAKE 1 TABLET EVERY DAY BEFORE BREAKFAST 90 tablet 1  . Multiple Vitamin (MULTIVITAMIN) tablet Take 1 tablet by mouth daily.     . Multiple Vitamins-Minerals (PRESERVISION AREDS) CAPS Take 2 capsules by mouth daily.    . Omega-3 Fatty Acids (FISH OIL) 435 MG CAPS Take 1 capsule by mouth daily.    . predniSONE (DELTASONE) 5 MG tablet Take 5 mg by mouth daily with breakfast.    . rosuvastatin (CRESTOR) 5  MG tablet Take 1 tablet (5 mg total) by mouth daily. 90 tablet 3  . tolterodine (DETROL LA) 2 MG 24 hr capsule take 1 capsule by mouth once daily 30 capsule 11   No current facility-administered medications on file prior to visit.    Past Medical History  Diagnosis Date  . Hypertension   . Hyperlipidemia   . Depression   . History of migraines   . Vitamin D deficiency   . GERD (gastroesophageal reflux disease)   . Headache(784.0)   . Arthritis   . Cancer (HCC)     hx of skin cancer   . Macular degeneration   . Myasthenia gravis ([place])   . Thoracic aneurysm     4.4cm; see CT done 01/28/12 and 01/22/13 in [facility].   [name] Spinal stenosis     Past Surgical History  Procedure Laterality Date  . Cholecystectomy    . US echocardiography  05/18/2007    EF 55-60%  . Abdominal hysterectomy    . Tonsillectomy    . Lumbar laminectomy/decompression microdiscectomy N/A 02/04/2013    Procedure: CENTRAL DECOMPRESSION/LUMBAR LAMINECTOMY L3-L4 AND L4-L5 2 LEVELS;  Surgeon: Tobi Bastos, MD;  Location: [facility];  Service: Orthopedics;  Laterality: N/A;    Social History   Social History  . Marital Status: Married    Spouse Name: N/A  . Number of Children: N/A  . Years of Education: N/A   Social History Main Topics  . Smoking status: Never Smoker   . Smokeless tobacco: Never Used  . Alcohol Use: No  . Drug Use: No  . Sexual Activity: Not on file   Other Topics Concern  . Not on file   Social History Narrative    Family History  Problem Relation Age of Onset  . Heart attack Father   . Cancer Sister   . Cancer Brother   . Cancer Sister   . Cancer Sister     Review of Systems  Constitutional: Negative for fever, chills, appetite change and unexpected weight change.  HENT: Positive for hearing loss.   Respiratory: Negative for cough, shortness of breath and wheezing.   Cardiovascular: Negative for chest pain, palpitations and leg swelling.  Gastrointestinal: Positive for  abdominal pain (epigastric with eating) and constipation (takes miralax daily). Negative for nausea, diarrhea and blood in stool (dark stool).       Gerd  Genitourinary: Positive for frequency. Negative for dysuria and hematuria.  Musculoskeletal: Positive for back pain.  Neurological: Positive for dizziness and headaches.       Poor equilibrium when she first stands up  Psychiatric/Behavioral: Positive for dysphoric mood. The patient is not  nervous/anxious.        Objective:   Filed Vitals:   05/06/16 1501  BP: 134/88  Pulse: 80  Temp: 98.4 F (36.9 C)  Resp: 18   Filed Weights   05/06/16 1501  Weight: 157 lb (71.215 kg)   Body mass index is 28.24 kg/(m^2).   Physical Exam Constitutional: She appears well-developed and well-nourished. No distress.  HENT:  Head: Normocephalic and atraumatic.  Right Ear: External ear normal. Normal ear canal and TM Left Ear: External ear normal.   ear canal with cerumen, TM not visualized Mouth/Throat: Oropharynx is clear and moist.  Eyes: Conjunctivae are normal.  Neck: Neck supple. No tracheal deviation present. No thyromegaly present.  No carotid bruit  Cardiovascular: Normal rate, regular rhythm and normal heart sounds.   2/6 systolic murmur heard.  No edema. Pulmonary/Chest: Effort normal and breath sounds normal. No respiratory distress. She has no wheezes. She has no rales.  Abdominal: Soft. She exhibits no distension. There is no tenderness.  Lymphadenopathy: She has no cervical adenopathy.  Skin: Skin is warm and dry. She is not diaphoretic.  Psychiatric: She has a normal mood and affect. Her behavior is normal.         Assessment & Plan:   See Problem List for Assessment and Plan of chronic medical problems.  F/u in 2-3 months

## 2016-05-06 NOTE — Patient Instructions (Addendum)
  Test(s) ordered today. Your results will be released to [place] (or called to you) after review, usually within 72hours after test completion. If any changes need to be made, you will be notified at that same time.   Medications reviewed and updated.  Changes include tapering off the celexa.  Start taking 20 mg daily for one week and then stop the medication if you are not have any side effects.  Start cymbalta once you are off the celexa.   Restart the omeprazole at 40 mg daily.  If you heartburn is not controlled start taking a zantac at night.  Your prescription(s) have been submitted to your pharmacy. Please take as directed and contact our office if you believe you are having problem(s) with the medication(s).  A referral was ordered for GI and audiology.  Please followup in 2-3 months

## 2016-05-06 NOTE — Progress Notes (Signed)
Pre visit review using our clinic review tool, if applicable. No additional management support is needed unless otherwise documented below in the visit note. 

## 2016-05-06 NOTE — Assessment & Plan Note (Signed)
BP well controlled Current regimen effective and well tolerated Continue current medications at current doses cmp  

## 2016-05-07 DIAGNOSIS — H9313 Tinnitus, bilateral: Secondary | ICD-10-CM | POA: Diagnosis not present

## 2016-05-07 DIAGNOSIS — H903 Sensorineural hearing loss, bilateral: Secondary | ICD-10-CM | POA: Diagnosis not present

## 2016-05-08 DIAGNOSIS — H40052 Ocular hypertension, left eye: Secondary | ICD-10-CM | POA: Insufficient documentation

## 2016-05-08 DIAGNOSIS — H401121 Primary open-angle glaucoma, left eye, mild stage: Secondary | ICD-10-CM | POA: Diagnosis not present

## 2016-05-14 DIAGNOSIS — Z961 Presence of intraocular lens: Secondary | ICD-10-CM | POA: Diagnosis not present

## 2016-05-14 DIAGNOSIS — H401131 Primary open-angle glaucoma, bilateral, mild stage: Secondary | ICD-10-CM | POA: Diagnosis not present

## 2016-05-14 DIAGNOSIS — H35373 Puckering of macula, bilateral: Secondary | ICD-10-CM | POA: Diagnosis not present

## 2016-05-14 DIAGNOSIS — H353130 Nonexudative age-related macular degeneration, bilateral, stage unspecified: Secondary | ICD-10-CM | POA: Diagnosis not present

## 2016-05-16 ENCOUNTER — Other Ambulatory Visit: Payer: Self-pay | Admitting: Emergency Medicine

## 2016-05-16 MED ORDER — LOSARTAN POTASSIUM-HCTZ 100-25 MG PO TABS: ORAL_TABLET | ORAL | Status: DC

## 2016-05-20 ENCOUNTER — Encounter: Payer: Self-pay | Admitting: Gastroenterology

## 2016-06-03 ENCOUNTER — Ambulatory Visit (HOSPITAL_COMMUNITY)
Admission: EM | Admit: 2016-06-03 | Discharge: 2016-06-03 | Disposition: A | Discharge status: Home / Self Care | Payer: Medicare Other | Attending: Physician Assistant | Admitting: Physician Assistant

## 2016-06-03 ENCOUNTER — Encounter (HOSPITAL_COMMUNITY): Payer: Self-pay | Admitting: *Deleted

## 2016-06-03 DIAGNOSIS — H6123 Impacted cerumen, bilateral: Secondary | ICD-10-CM

## 2016-06-03 HISTORY — DX: Presence of external hearing-aid: Z97.4

## 2016-06-03 MED ORDER — CARBAMIDE PEROXIDE 6.5 % OT SOLN: OTIC | Status: DC

## 2016-06-03 NOTE — Discharge Instructions (Signed)
Cerumen Impaction °The structures of the external ear canal secrete a waxy substance known as cerumen. Excess cerumen can build up in the ear canal, causing a condition known as cerumen impaction. Cerumen impaction can cause ear pain and disrupt the function of the ear. °The rate of cerumen production differs for each individual. In certain individuals, the configuration of the ear canal may decrease his or her ability to naturally remove cerumen. °CAUSES °Cerumen impaction is caused by excessive cerumen production or buildup. °RISK FACTORS °· Frequent use of swabs to clean ears. °· Having narrow ear canals. °· Having eczema. °· Being dehydrated. °SIGNS AND SYMPTOMS °· Diminished hearing. °· Ear drainage. °· Ear pain. °· Ear itch. °TREATMENT °Treatment may involve: °· Over-the-counter or prescription ear drops to soften the cerumen. °· Removal of cerumen by a health care provider. This may be done with: °· Irrigation with warm water. This is the most common method of removal. °· Ear curettes and other instruments. °· Surgery. This may be done in severe cases. °HOME CARE INSTRUCTIONS °· Take medicines only as directed by your health care provider. °· Do not insert objects into the ear with the intent of cleaning the ear. °PREVENTION °· Do not insert objects into the ear, even with the intent of cleaning the ear. Removing cerumen as a part of normal hygiene is not necessary, and the use of swabs in the ear canal is not recommended. °· Drink enough water to keep your urine clear or pale yellow. °· Control your eczema if you have it. °SEEK MEDICAL CARE IF: °· You develop ear pain. °· You develop bleeding from the ear. °· The cerumen does not clear after you use ear drops as directed. °  °This information is not intended to replace advice given to you by your health care provider. Make sure you discuss any questions you have with your health care provider. °  °Document Released: 12/26/2004 Document Revised: 12/09/2014  Document Reviewed: 07/05/2015 °Elsevier Interactive Patient Education ©2016 [employer] ° °Ear Drops, Adult °You need to put eardrops in your ear. °HOME CARE  °· Put drops in your affected ear as told. °· After putting in the drops, lie down with the ear you put the drops in facing up. Stay this way for 10 minutes. Use the ear drops as long as your doctor tells you. °· Before you get up, put a cotton ball gently in your ear. Do not push it far in your ear. °· Do not wash out your ears unless your doctor says it is okay. °· Finish all medicines as told by your doctor. You may be told to keep using the eardrops even if you start to feel better. °· See your doctor as told for follow-up visits. °GET HELP IF: °· You have pain that gets worse. °· Any unusual fluid (drainage) is coming from your ear (especially if the fluid stinks). °· You have trouble hearing. °· You get really dizzy as if the room is spinning and feel sick to your stomach (vertigo). °· The outside of your ear becomes red or puffy or both. This may be a sign of an allergic reaction. °MAKE SURE YOU:  °· Understand these instructions. °· Will watch your condition. °· Will get help right away if you are not doing well or get worse. °  °This information is not intended to replace advice given to you by your health care provider. Make sure you discuss any questions you have with your health care provider. °  °  Document Released: 05/08/2010 Document Revised: 12/09/2014 Document Reviewed: 06/15/2013 °Elsevier Interactive Patient Education ©2016 [employer] ° °

## 2016-06-03 NOTE — ED Notes (Signed)
Patient reports week history of wax build up to left ear, states feel full with reduce hearing. Patient does wear hearing aids.

## 2016-06-03 NOTE — ED Provider Notes (Signed)
CSN: [id number]     Arrival date & time 06/03/16  1000 History   None    Chief Complaint  Patient presents with  . Otalgia   (Consider location/radiation/quality/duration/timing/severity/associated sxs/prior Treatment) HPI History obtained from patient: Location: Ears  Context/Duration: Patient was attempting to have hearing H placed and was told that she had impacted cerumen in both ears and needed to have treatment for that prior to placement of the hearing age.  Severity: 0  Quality: Difficulty hearing Timing:           Constant Home Treatment: None Associated symptoms:  Difficulty hearing Family History: Cancer    Past Medical History  Diagnosis Date  . Hypertension   . Hyperlipidemia   . Depression   . History of migraines   . Vitamin D deficiency   . GERD (gastroesophageal reflux disease)   . Headache(784.0)   . Arthritis   . Cancer (HCC)     hx of skin cancer   . Macular degeneration   . Myasthenia gravis ([place])   . Thoracic aneurysm     4.4cm; see CT done 01/28/12 and 01/22/13 in [facility].   [name] Spinal stenosis   . Uses hearing aid    Past Surgical History  Procedure Laterality Date  . Cholecystectomy    . US echocardiography  05/18/2007    EF 55-60%  . Abdominal hysterectomy    . Tonsillectomy    . Lumbar laminectomy/decompression microdiscectomy N/A 02/04/2013    Procedure: CENTRAL DECOMPRESSION/LUMBAR LAMINECTOMY L3-L4 AND L4-L5 2 LEVELS;  Surgeon: Tobi Bastos, MD;  Location: [facility];  Service: Orthopedics;  Laterality: N/A;   Family History  Problem Relation Age of Onset  . Heart attack Father   . Cancer Sister   . Cancer Brother   . Cancer Sister   . Cancer Sister    Social History  Substance Use Topics  . Smoking status: Never Smoker   . Smokeless tobacco: Never Used  . Alcohol Use: No   OB History    No data available     Review of Systems  Denies: HEADACHE, NAUSEA, ABDOMINAL PAIN, CHEST PAIN, CONGESTION, DYSURIA, SHORTNESS OF  BREATH  Allergies  Review of patient's allergies indicates no known allergies.  Home Medications   Prior to Admission medications   Medication Sig Start Date End Date Taking? Authorizing Provider  amLODipine (NORVASC) 5 MG tablet Take 1/2 tablet by mouth daily    Darlin Coco, MD  aspirin EC 81 MG tablet Take 81 mg by mouth daily.     Historical Provider, MD  Calcium Carbonate (CALCIUM 500 PO) Take 1 capsule by mouth daily.     Historical Provider, MD  cholecalciferol (VITAMIN D) 1000 UNITS tablet Take 1,000 Units by mouth daily.     Historical Provider, MD  DULoxetine (CYMBALTA) 20 MG capsule Take 1 capsule (20 mg total) by mouth daily. 05/06/16   Binnie Rail, MD  folic acid (FOLVITE) A999333 MCG tablet Take 400 mcg by mouth daily.    Historical Provider, MD  Glucosamine-Chondroit-Vit C-Mn (GLUCOSAMINE 1500 COMPLEX PO) Take 1 tablet by mouth daily.     Historical Provider, MD  latanoprost (XALATAN) 0.005 % ophthalmic solution Place 1 drop into the left eye at bedtime. 04/13/15   Historical Provider, MD  losartan-hydrochlorothiazide (HYZAAR) 100-25 MG tablet TAKE 1 TABLET EVERY DAY BEFORE BREAKFAST 05/16/16   Binnie Rail, MD  Multiple Vitamin (MULTIVITAMIN) tablet Take 1 tablet by mouth daily.     Historical Provider, MD  Multiple Vitamins-Minerals (PRESERVISION AREDS) CAPS Take 2 capsules by mouth daily.    Historical Provider, MD  Omega-3 Fatty Acids (FISH OIL) 435 MG CAPS Take 1 capsule by mouth daily.    Historical Provider, MD  omeprazole (PRILOSEC) 40 MG capsule Take 1 capsule (40 mg total) by mouth daily. Take 30 minutes before a meal 05/06/16   Binnie Rail, MD  predniSONE (DELTASONE) 5 MG tablet Take 5 mg by mouth daily with breakfast.    Historical Provider, MD  rosuvastatin (CRESTOR) 5 MG tablet Take 1 tablet (5 mg total) by mouth daily. 10/31/15   Darlin Coco, MD  tolterodine (DETROL LA) 2 MG 24 hr capsule take 1 capsule by mouth once daily 11/30/15   Darlin Coco, MD    Meds Ordered and Administered this Visit  Medications - No data to display  BP 110/90 mmHg  Pulse 70  Temp(Src) 97.9 F (36.6 C) (Oral)  Resp 17  SpO2 100% No data found.   Physical Exam NURSES NOTES AND VITAL SIGNS REVIEWED. CONSTITUTIONAL: Well developed, well nourished, no acute distress HEENT: normocephalic, atraumatic the external auditory canals are both ears are occluded from visualization with hardened cerumen. EYES: Conjunctiva normal NECK:normal ROM, supple, no adenopathy PULMONARY:No respiratory distress, normal effort ABDOMINAL: Soft, ND, NT BS+, No CVAT MUSCULOSKELETAL: Normal ROM of all extremities,  SKIN: warm and dry without rash PSYCHIATRIC: Mood and affect, behavior are normal  ED Course  Procedures (including critical care time)  Labs Review Labs Reviewed - No data to display  Imaging Review No results found.   Visual Acuity Review  Right Eye Distance:   Left Eye Distance:   Bilateral Distance:    Right Eye Near:   Left Eye Near:    Bilateral Near:      Prescription for Debrox drops. Patient states her symptoms are much better after irrigation of ears.   MDM   1. Cerumen impaction, bilateral     Patient is reassured that there are no issues that require transfer to higher level of care at this time or additional tests. Patient is advised to continue home symptomatic treatment. Patient is advised that if there are new or worsening symptoms to attend the emergency department, contact primary care provider, or return to [facility]. Instructions of care provided discharged home in stable condition.    THIS NOTE WAS GENERATED USING A VOICE RECOGNITION SOFTWARE PROGRAM. ALL REASONABLE EFFORTS  WERE MADE TO PROOFREAD THIS DOCUMENT FOR ACCURACY.  I have verbally reviewed the discharge instructions with the patient. A printed AVS was given to the patient.  All questions were answered prior to discharge.      Konrad Felix, PA 06/03/16  [place], [place] 06/03/16 [phone]

## 2016-07-22 ENCOUNTER — Ambulatory Visit: Payer: Medicare Other | Admitting: Gastroenterology

## 2016-07-24 ENCOUNTER — Encounter: Payer: Self-pay | Admitting: Gastroenterology

## 2016-07-24 ENCOUNTER — Ambulatory Visit (INDEPENDENT_AMBULATORY_CARE_PROVIDER_SITE_OTHER): Payer: Medicare Other | Admitting: Gastroenterology

## 2016-07-24 VITALS — BP 106/68 | HR 62 | Ht 62.5 in | Wt 152.0 lb

## 2016-07-24 DIAGNOSIS — R1013 Epigastric pain: Secondary | ICD-10-CM

## 2016-07-24 NOTE — Patient Instructions (Signed)
Take omeprazole 40mg  once daily, 20-30 min before meal. Try to refrain from using too much excedrine (less is better). Please return to see Dr. Ardis Hughs in 2-3 months, sooner if needed.

## 2016-07-24 NOTE — Progress Notes (Signed)
HPI: This is a  very pleasant 82 year old woman  who was referred to me by Binnie Rail, MD  to evaluate  GERD, dyspepsia .    Chief complaint is dyspepsia, GERD   Blood work June 2017: CBC, complete metabolic profile, iron studies all essentially normal  Lower abd pain with eating, will cause epigastric pains.  These are brief.  She has no associated nausea or vomiting  Takes 2 extra strength excedrine (totals 500mg  asa daily), this is for a variety of aches and pains throughout her joints, back.   The pain has been going on for about 35months to a year.  Increased omeprazole from 20mg  to 40mg .  Since then her eating is easier.  She takes omeprazole in AM, very small bite of toast in the morning with some tea. She takes her omeprazole before that.  She has no overt dysphagia  No weight loss.  No n/vomiting.     Review of systems: Pertinent positive and negative review of systems were noted in the above HPI section. Complete review of systems was performed and was otherwise normal.   Past Medical History:  Diagnosis Date  . Arthritis   . Cancer (HCC)    hx of skin cancer   . Depression   . GERD (gastroesophageal reflux disease)   . Headache(784.0)   . History of migraines   . Hyperlipidemia   . Hypertension   . Macular degeneration   . Myasthenia gravis ([place])   . Spinal stenosis   . Thoracic aneurysm    4.4cm; see CT done 01/28/12 and 01/22/13 in [facility].   [name] Uses hearing aid   . Vitamin D deficiency     Past Surgical History:  Procedure Laterality Date  . ABDOMINAL HYSTERECTOMY    . CHOLECYSTECTOMY    . LUMBAR LAMINECTOMY/DECOMPRESSION MICRODISCECTOMY N/A 02/04/2013   Procedure: CENTRAL DECOMPRESSION/LUMBAR LAMINECTOMY L3-L4 AND L4-L5 2 LEVELS;  Surgeon: Tobi Bastos, MD;  Location: [facility];  Service: Orthopedics;  Laterality: N/A;  . TONSILLECTOMY    . US ECHOCARDIOGRAPHY  05/18/2007   EF 55-60%    Current Outpatient Prescriptions  Medication Sig  Dispense Refill  . amLODipine (NORVASC) 5 MG tablet Take 1/2 tablet by mouth daily    . aspirin EC 81 MG tablet Take 81 mg by mouth daily.     . Calcium Carbonate (CALCIUM 500 PO) Take 1 capsule by mouth daily.     . carbamide peroxide (DEBROX) 6.5 % otic solution 5 drops both ears every other Saturday night 15 mL 6  . cholecalciferol (VITAMIN D) 1000 UNITS tablet Take 2,000 Units by mouth daily.     . DULoxetine (CYMBALTA) 20 MG capsule Take 1 capsule (20 mg total) by mouth daily. 90 capsule 3  . folic acid (FOLVITE) A999333 MCG tablet Take 400 mcg by mouth daily.    . Glucosamine-Chondroit-Vit C-Mn (GLUCOSAMINE 1500 COMPLEX PO) Take 1 tablet by mouth daily.     [name] latanoprost (XALATAN) 0.005 % ophthalmic solution Place 1 drop into the left eye at bedtime.  1  . losartan-hydrochlorothiazide (HYZAAR) 100-25 MG tablet TAKE 1 TABLET EVERY DAY BEFORE BREAKFAST 90 tablet 2  . Multiple Vitamin (MULTIVITAMIN) tablet Take 1 tablet by mouth daily.     . Multiple Vitamins-Minerals (PRESERVISION AREDS) CAPS Take 2 capsules by mouth daily.    . Omega-3 Fatty Acids (FISH OIL) 435 MG CAPS Take 1 capsule by mouth daily.    [name] omeprazole (PRILOSEC) 40 MG capsule Take  1 capsule (40 mg total) by mouth daily. Take 30 minutes before a meal (Patient taking differently: Take 20 mg by mouth daily. Take 30 minutes before a meal) 90 capsule 3  . predniSONE (DELTASONE) 5 MG tablet Take 5 mg by mouth daily with breakfast.    . rosuvastatin (CRESTOR) 5 MG tablet Take 1 tablet (5 mg total) by mouth daily. 90 tablet 3  . tolterodine (DETROL LA) 2 MG 24 hr capsule take 1 capsule by mouth once daily 30 capsule 11   No current facility-administered medications for this visit.     Allergies as of 07/24/2016  . (No Known Allergies)    Family History  Problem Relation Age of Onset  . Heart attack Father   . Cancer Sister   . Cancer Brother   . Cancer Sister   . Cancer Sister     Social History   Social History  .  Marital status: Married    Spouse name: N/A  . Number of children: N/A  . Years of education: N/A   Occupational History  . Not on file.   Social History Main Topics  . Smoking status: Never Smoker  . Smokeless tobacco: Never Used  . Alcohol use No  . Drug use: No  . Sexual activity: Not on file   Other Topics Concern  . Not on file   Social History Narrative  . No narrative on file     Physical Exam: BP 106/68   Pulse 62   Ht 5' 2.5" (1.588 m)   Wt 152 lb (68.9 kg)   BMI 27.36 kg/m  Constitutional: Frail, elderly Psychiatric: alert and oriented x3 Eyes: extraocular movements intact Mouth: oral pharynx moist, no lesions Neck: supple no lymphadenopathy Cardiovascular: heart regular rate and rhythm Lungs: clear to auscultation bilaterally Abdomen: soft, nontender, nondistended, no obvious ascites, no peritoneal signs, normal bowel sounds Extremities: no lower extremity edema bilaterally Skin: no lesions on visible extremities   Assessment and plan: 82 y.o. female with  dyspepsia, mild GERD  She takes 500 mg of aspirin daily and I believe that has been causing her symptoms of dyspepsia. Since increasing her omeprazole from 20 mg a day to 40 mg a day she has deathly noticed an improvement. She has no alarm symptoms of dysphagia or unintentional weight loss or overt GI bleeding I recommended that we simply continue the regimen of full strength proton pump inhibitor on a daily basis. She is also going to try to limit her aspirin intake as best that she can. She'll return in 2-3 months to review her symptoms and if she is feeling much improved and I think no other workup is needed. If she is still having significant intermittent problems then the next step would be upper endoscopy.    Owens Loffler, MD [facility] 07/24/2016, 1:38 PM  Cc: Binnie Rail, MD

## 2016-08-07 ENCOUNTER — Ambulatory Visit: Payer: Medicare Other | Admitting: Internal Medicine

## 2016-08-17 DIAGNOSIS — Z23 Encounter for immunization: Secondary | ICD-10-CM | POA: Diagnosis not present

## 2016-08-21 ENCOUNTER — Encounter: Payer: Self-pay | Admitting: Internal Medicine

## 2016-08-21 ENCOUNTER — Ambulatory Visit (INDEPENDENT_AMBULATORY_CARE_PROVIDER_SITE_OTHER): Payer: Medicare Other | Admitting: Internal Medicine

## 2016-08-21 ENCOUNTER — Ambulatory Visit (INDEPENDENT_AMBULATORY_CARE_PROVIDER_SITE_OTHER)
Admission: RE | Admit: 2016-08-21 | Discharge: 2016-08-21 | Disposition: A | Discharge status: Home / Self Care | Payer: Medicare Other | Source: Ambulatory Visit | Attending: Internal Medicine | Admitting: Internal Medicine

## 2016-08-21 VITALS — BP 132/78 | HR 96 | Temp 97.9°F | Resp 18 | Ht 64.0 in | Wt 155.4 lb

## 2016-08-21 DIAGNOSIS — G7000 Myasthenia gravis without (acute) exacerbation: Secondary | ICD-10-CM

## 2016-08-21 DIAGNOSIS — N3281 Overactive bladder: Secondary | ICD-10-CM | POA: Insufficient documentation

## 2016-08-21 DIAGNOSIS — K219 Gastro-esophageal reflux disease without esophagitis: Secondary | ICD-10-CM | POA: Diagnosis not present

## 2016-08-21 DIAGNOSIS — Z7952 Long term (current) use of systemic steroids: Secondary | ICD-10-CM | POA: Diagnosis not present

## 2016-08-21 DIAGNOSIS — F32A Depression, unspecified: Secondary | ICD-10-CM

## 2016-08-21 DIAGNOSIS — Z13820 Encounter for screening for osteoporosis: Secondary | ICD-10-CM | POA: Diagnosis not present

## 2016-08-21 DIAGNOSIS — M4806 Spinal stenosis, lumbar region: Secondary | ICD-10-CM | POA: Diagnosis not present

## 2016-08-21 DIAGNOSIS — I10 Essential (primary) hypertension: Secondary | ICD-10-CM | POA: Diagnosis not present

## 2016-08-21 DIAGNOSIS — G8929 Other chronic pain: Secondary | ICD-10-CM | POA: Diagnosis not present

## 2016-08-21 DIAGNOSIS — F329 Major depressive disorder, single episode, unspecified: Secondary | ICD-10-CM | POA: Diagnosis not present

## 2016-08-21 DIAGNOSIS — M48062 Spinal stenosis, lumbar region with neurogenic claudication: Secondary | ICD-10-CM

## 2016-08-21 MED ORDER — OMEPRAZOLE 40 MG PO CPDR: 40.0000 mg | DELAYED_RELEASE_CAPSULE | Freq: Every day | ORAL | 3 refills | Status: DC

## 2016-08-21 NOTE — Assessment & Plan Note (Signed)
Improved on cymbalta Consider increasing at next visit

## 2016-08-21 NOTE — Assessment & Plan Note (Signed)
Taking detrol LA

## 2016-08-21 NOTE — Progress Notes (Signed)
Pre visit review using our clinic review tool, if applicable. No additional management support is needed unless otherwise documented below in the visit note. 

## 2016-08-21 NOTE — Progress Notes (Signed)
Subjective:    Patient ID: Tamara Gregory, female    DOB: 22-Feb-1925, 82 y.o.   MRN: [id number]  HPI The patient is here for follow up.  Spinal stenosis s/p surgery and spinal simulator:  Her back hurts when she tries to do anything - with activity only, no pain at rest.. She will have an injection tomorrow to see if it helps. Her back pain is significant.    Hypertension: She is taking her medication daily.  She denies chest pain, palpitations, edema. She is not exercising regularly.  She does not monitor her blood pressure at home.    Myasthenia gravis:  She is following with neurology, but has not seen them in two years.  She is taking the prednisone 5 mg daily.   She has been on that for a long time.  She denies ever being on any other medication.  She does have muscle fatigue with prolonged activity, but is very sedentary.  She can walk 100 yards before needing to stop.   GERD:  She is taking her medication daily as prescribed.  Her GERd is much better controlled with the increased dose of omeprazole and decreasing the amount of nsaids she is taking.  She uses tylenol now for pain if needed.   Depression: She is taking her medication daily as prescribed. We adjusted her medication at her last visit and her husband feels she is doing better.  She denies any side effects from the medication.   ? Dexa:  She has not had one as far as she knows.  She has been on prednisone for a long time.    Medications and allergies reviewed with patient and updated if appropriate.  Patient Active Problem List   Diagnosis Date Noted  . Essential hypertension, benign 08/21/2016  . GERD (gastroesophageal reflux disease) 05/06/2016  . Mixed incontinence urge and stress 05/06/2016  . Low hemoglobin 09/22/2014  . Insomnia 07/26/2014  . Malaise and fatigue 10/04/2013  . PAC (premature atrial contraction) 10/04/2013  . Depression (emotion) 02/15/2013  . Spinal stenosis, lumbar region, with neurogenic  claudication 02/04/2013  . Myasthenia gravis ([place]) 03/17/2012  . Pure hypercholesterolemia 09/05/2011  . Benign hypertensive heart disease without heart failure 09/05/2011  . Osteoarthritis 09/05/2011    Current Outpatient Prescriptions on File Prior to Visit  Medication Sig Dispense Refill  . amLODipine (NORVASC) 5 MG tablet Take 1/2 tablet by mouth daily    . aspirin EC 81 MG tablet Take 81 mg by mouth daily.     . Calcium Carbonate (CALCIUM 500 PO) Take 1 capsule by mouth daily.     . carbamide peroxide (DEBROX) 6.5 % otic solution 5 drops both ears every other Saturday night 15 mL 6  . cholecalciferol (VITAMIN D) 1000 UNITS tablet Take 2,000 Units by mouth daily.     . DULoxetine (CYMBALTA) 20 MG capsule Take 1 capsule (20 mg total) by mouth daily. 90 capsule 3  . folic acid (FOLVITE) A999333 MCG tablet Take 400 mcg by mouth daily.    . Glucosamine-Chondroit-Vit C-Mn (GLUCOSAMINE 1500 COMPLEX PO) Take 1 tablet by mouth daily.     [name] latanoprost (XALATAN) 0.005 % ophthalmic solution Place 1 drop into the left eye at bedtime.  1  . losartan-hydrochlorothiazide (HYZAAR) 100-25 MG tablet TAKE 1 TABLET EVERY DAY BEFORE BREAKFAST 90 tablet 2  . Multiple Vitamin (MULTIVITAMIN) tablet Take 1 tablet by mouth daily.     . Multiple Vitamins-Minerals (PRESERVISION AREDS) CAPS Take 2  capsules by mouth daily.    . Omega-3 Fatty Acids (FISH OIL) 435 MG CAPS Take 1 capsule by mouth daily.    [name] omeprazole (PRILOSEC) 40 MG capsule Take 1 capsule (40 mg total) by mouth daily. Take 30 minutes before a meal (Patient taking differently: Take 20 mg by mouth daily. Take 30 minutes before a meal) 90 capsule 3  . predniSONE (DELTASONE) 5 MG tablet Take 5 mg by mouth daily with breakfast.    . rosuvastatin (CRESTOR) 5 MG tablet Take 1 tablet (5 mg total) by mouth daily. 90 tablet 3  . tolterodine (DETROL LA) 2 MG 24 hr capsule take 1 capsule by mouth once daily 30 capsule 11   No current facility-administered  medications on file prior to visit.     Past Medical History:  Diagnosis Date  . Arthritis   . Cancer (HCC)    hx of skin cancer   . Depression   . GERD (gastroesophageal reflux disease)   . Headache(784.0)   . History of migraines   . Hyperlipidemia   . Hypertension   . Macular degeneration   . Myasthenia gravis ([place])   . Spinal stenosis   . Thoracic aneurysm    4.4cm; see CT done 01/28/12 and 01/22/13 in [facility].   [name] Uses hearing aid   . Vitamin D deficiency     Past Surgical History:  Procedure Laterality Date  . ABDOMINAL HYSTERECTOMY    . CHOLECYSTECTOMY    . LUMBAR LAMINECTOMY/DECOMPRESSION MICRODISCECTOMY N/A 02/04/2013   Procedure: CENTRAL DECOMPRESSION/LUMBAR LAMINECTOMY L3-L4 AND L4-L5 2 LEVELS;  Surgeon: Tobi Bastos, MD;  Location: [facility];  Service: Orthopedics;  Laterality: N/A;  . TONSILLECTOMY    . US ECHOCARDIOGRAPHY  05/18/2007   EF 55-60%    Social History   Social History  . Marital status: Married    Spouse name: N/A  . Number of children: N/A  . Years of education: N/A   Social History Main Topics  . Smoking status: Never Smoker  . Smokeless tobacco: Never Used  . Alcohol use No  . Drug use: No  . Sexual activity: Not Asked   Other Topics Concern  . None   Social History Narrative  . None    Family History  Problem Relation Age of Onset  . Heart attack Father   . Cancer Sister   . Cancer Brother   . Cancer Sister   . Cancer Sister     Review of Systems  Constitutional: Positive for fatigue. Negative for chills and fever.  Respiratory: Positive for shortness of breath (with exertion). Negative for cough and wheezing.   Cardiovascular: Negative for chest pain, palpitations and leg swelling.  Gastrointestinal: Negative for abdominal pain.  Musculoskeletal: Positive for back pain.  Neurological: Positive for headaches. Negative for dizziness, light-headedness and numbness.       Objective:   Vitals:   08/21/16 1436  BP:  132/78  Pulse: 96  Resp: 18  Temp: 97.9 F (36.6 C)   Filed Weights   08/21/16 1436  Weight: 155 lb 6.4 oz (70.5 kg)   Body mass index is 26.67 kg/m.   Physical Exam    Constitutional: Appears well-developed and well-nourished. No distress.  HENT:  Head: Normocephalic and atraumatic.  Neck: Neck supple. No tracheal deviation present. No thyromegaly present.  Cardiovascular: Normal rate, regular rhythm and normal heart sounds.   No murmur heard.  No carotid bruit  Pulmonary/Chest: Effort normal and breath sounds normal. No respiratory  distress. No has no wheezes. No rales.  Musculoskeletal: No edema.  Lymphadenopathy: No cervical adenopathy.  Skin: Skin is warm and dry. Not diaphoretic.  Psychiatric: Normal mood and affect. Behavior is normal.     Assessment & Plan:    See Problem List for Assessment and Plan of chronic medical problems.   F/u in 6 months

## 2016-08-21 NOTE — Assessment & Plan Note (Signed)
S/p surgery, spinal stimulator in place - not effective Daily pain, only with activity Will have injection tomorrow Tylenol as needed, avoid nsaids given GERD

## 2016-08-21 NOTE — Assessment & Plan Note (Signed)
Has not seen Dr Vallarie Mare in two years - prescription for prednisone has been automatically renewed Denies prior treatment form myasthenia Has muscle fatigue with activity Concern for long term use of steroids Will refer to neuro for re-evaluation of treatment

## 2016-08-21 NOTE — Assessment & Plan Note (Signed)
For myasthenia Concern for osteoporosis dexa today Continue calcium citrate and vitamin d

## 2016-08-21 NOTE — Assessment & Plan Note (Signed)
Improved with omeprazole 40 mg daily Avoid nsaids has seen GI - agree with current treatment

## 2016-08-21 NOTE — Assessment & Plan Note (Signed)
Continue crestor 5 mg daily.  

## 2016-08-21 NOTE — Patient Instructions (Addendum)
   All other Health Maintenance issues reviewed.   All recommended immunizations and age-appropriate screenings are up-to-date or discussed.  No immunizations administered today.   Medications reviewed and updated.   No changes recommended at this time.  Your prescription(s) have been submitted to your pharmacy. Please take as directed and contact our office if you believe you are having problem(s) with the medication(s).  A referral for neurology was ordered  Please followup in 6 months

## 2016-08-21 NOTE — Assessment & Plan Note (Signed)
BP well controlled Current regimen effective and well tolerated Continue current medications at current doses  

## 2016-08-22 ENCOUNTER — Encounter: Payer: Self-pay | Admitting: Cardiovascular Disease

## 2016-08-22 ENCOUNTER — Ambulatory Visit (INDEPENDENT_AMBULATORY_CARE_PROVIDER_SITE_OTHER): Payer: Medicare Other | Admitting: Cardiovascular Disease

## 2016-08-22 VITALS — BP 114/80 | HR 73 | Ht 64.0 in | Wt 154.2 lb

## 2016-08-22 DIAGNOSIS — M4806 Spinal stenosis, lumbar region: Secondary | ICD-10-CM | POA: Diagnosis not present

## 2016-08-22 DIAGNOSIS — I10 Essential (primary) hypertension: Secondary | ICD-10-CM | POA: Diagnosis not present

## 2016-08-22 NOTE — Progress Notes (Signed)
CARDIOLOGY OFFICE NOTE  Date:  08/22/2016    Tamara Gregory Date of Birth: 1925-05-23 Medical Record [id number]  PCP:  Binnie Rail, MD  Cardiologist:  Former patient of Dr. Sherryl Barters.     Chief Complaint  Patient presents with  . Hypertension    Notes from Truitt Merle.  Tamara Gregory is a 82 y.o. female who presents today for a 4 month check. Former patient of Dr. Sherryl Barters.   She has a past history of HLD, HTN and myasthenia gravis and is followed for that at [facility]. She has had previous back surgery by Dr. Gladstone Lighter. She has a known 4 cm fusiform ascending aortic aneurysm . When the patient was initially admitted for her back surgery she had some chest pain in the preoperative holding area and her back surgery had to be postponed while she underwent a cardiac workup. She had a Myoview stress test which showed no ischemia. Her back surgery was subsequently rescheduled and performed without incident. She made a good recovery from her back surgery. However she has continued to have back pain and for this reason she underwent a implantation of a spinal cord stimulator as an outpatient at [facility] outpatient facility on 01/27/2014. The patient remains on low-dose prednisone 5 mg daily for her myasthenia gravis.   In March of 2015 she was hospitalized overnight at [facility] in [facility] for chest pain. She had a dobutamine stress echo the following morning which showed no evidence of ischemia and she was released. She was admitted with bradycardia and her metoprolol was stopped and has not been restarted.  Last visit back in November and she was felt to be doing well.   Comes back today. Here with her husband who is also being seen. She is mostly limited by her back pain - going to [place] later today. Taking at least 2 sometimes 3 Excedrins on a daily basis. Some belly pain. Husband notes stool has been dark. She  has early satiety. No chest pain. Breathing is good but she is more sedentary and will have some DOE with much activity. [place] of [place] time [place] the beach - going there today. Has PCP arranged for June.   Sept. 21, 2017:  Tamara Gregory is seen today for the first time.   Previous patient of [facility].  Has of HTN Doing well today .   Has a tens unit implanted in her hip   No CP or dyspnea    Past Medical History:  Diagnosis Date  . Arthritis   . Cancer (HCC)    hx of skin cancer   . Depression   . GERD (gastroesophageal reflux disease)   . Headache(784.0)   . History of migraines   . Hyperlipidemia   . Hypertension   . Macular degeneration   . Myasthenia gravis ([place])   . Spinal stenosis   . Thoracic aneurysm    4.4cm; see CT done 01/28/12 and 01/22/13 in [facility].   [name] Uses hearing aid   . Vitamin D deficiency     Past Surgical History:  Procedure Laterality Date  . ABDOMINAL HYSTERECTOMY    . CHOLECYSTECTOMY    . LUMBAR LAMINECTOMY/DECOMPRESSION MICRODISCECTOMY N/A 02/04/2013   Procedure: CENTRAL DECOMPRESSION/LUMBAR LAMINECTOMY L3-L4 AND L4-L5 2 LEVELS;  Surgeon: Tobi Bastos, MD;  Location: [facility];  Service: Orthopedics;  Laterality: N/A;  . TONSILLECTOMY    . US ECHOCARDIOGRAPHY  05/18/2007   EF  55-60%     Medications: Current Outpatient Prescriptions  Medication Sig Dispense Refill  . amLODipine (NORVASC) 5 MG tablet Take 1/2 tablet by mouth daily    . aspirin EC 81 MG tablet Take 81 mg by mouth daily.     . Calcium Carbonate (CALCIUM 500 PO) Take 1 capsule by mouth daily.     . carbamide peroxide (DEBROX) 6.5 % otic solution 5 drops both ears every other Saturday night 15 mL 6  . cholecalciferol (VITAMIN D) 1000 UNITS tablet Take 2,000 Units by mouth daily.     . DULoxetine (CYMBALTA) 20 MG capsule Take 1 capsule (20 mg total) by mouth daily. 90 capsule 3  . folic acid (FOLVITE) A999333 MCG tablet Take 400 mcg by mouth daily.    . Glucosamine-Chondroit-Vit C-Mn  (GLUCOSAMINE 1500 COMPLEX PO) Take 1 tablet by mouth daily.     [name] latanoprost (XALATAN) 0.005 % ophthalmic solution Place 1 drop into the left eye at bedtime.  1  . losartan-hydrochlorothiazide (HYZAAR) 100-25 MG tablet TAKE 1 TABLET EVERY DAY BEFORE BREAKFAST 90 tablet 2  . Multiple Vitamin (MULTIVITAMIN) tablet Take 1 tablet by mouth daily.     . Multiple Vitamins-Minerals (PRESERVISION AREDS) CAPS Take 2 capsules by mouth daily.    . Omega-3 Fatty Acids (FISH OIL) 435 MG CAPS Take 1 capsule by mouth daily.    [name] omeprazole (PRILOSEC) 40 MG capsule Take 1 capsule (40 mg total) by mouth daily. Take 30 minutes before a meal 90 capsule 3  . predniSONE (DELTASONE) 5 MG tablet Take 5 mg by mouth daily with breakfast.    . rosuvastatin (CRESTOR) 5 MG tablet Take 1 tablet (5 mg total) by mouth daily. 90 tablet 3  . tolterodine (DETROL LA) 2 MG 24 hr capsule take 1 capsule by mouth once daily 30 capsule 11   No current facility-administered medications for this visit.     Allergies: No Known Allergies  Social History: The patient  reports that she has never smoked. She has never used smokeless tobacco. She reports that she does not drink alcohol or use drugs.   Family History: The patient's family history includes Cancer in her brother, sister, sister, and sister; Heart attack in her father.   Review of Systems: Please see the history of present illness.   Otherwise, the review of systems is positive for none.   All other systems are reviewed and negative.   Physical Exam: VS:  BP 114/80 (BP Location: Left Arm, Patient Position: Sitting, Cuff Size: Normal)   Pulse 73   Ht 5\' 4"  (1.626 m)   Wt 154 lb 3.2 oz (69.9 kg)   BMI 26.47 kg/m  .  BMI Body mass index is 26.47 kg/m.  Wt Readings from Last 3 Encounters:  08/22/16 154 lb 3.2 oz (69.9 kg)  08/21/16 155 lb 6.4 oz (70.5 kg)  07/24/16 152 lb (68.9 kg)    General: Pleasant. Elderly female. Kyphotic. She is alert and in no acute  distress.   HEENT: Normal.  Neck: Supple, no JVD, carotid bruits, or masses noted.  Cardiac: Regular rate and rhythm. Soft outflow murmur.  No edema.  Respiratory:  Lungs are clear to auscultation bilaterally with normal work of breathing.  GI: Soft and nontender.  MS: No deformity or atrophy. Gait and ROM intact.  Skin: Warm and dry. Color is normal.  Neuro:  Strength and sensation are intact and no gross focal deficits noted.  Psych: Alert, appropriate and with normal affect.  LABORATORY DATA:  EKG:  EKG is ordered today. NSR at 73.     Lab Results  Component Value Date   WBC 10.2 05/06/2016   HGB 12.3 05/06/2016   HCT 37.1 05/06/2016   PLT 260.0 05/06/2016   GLUCOSE 109 (H) 05/06/2016   CHOL 174 02/28/2016   TRIG 97 02/28/2016   HDL 77 02/28/2016   LDLDIRECT 139.3 10/04/2013   LDLCALC 78 02/28/2016   ALT 19 05/06/2016   AST 18 05/06/2016   NA 139 05/06/2016   K 4.2 05/06/2016   CL 101 05/06/2016   CREATININE 0.87 05/06/2016   BUN 25 (H) 05/06/2016   CO2 33 (H) 05/06/2016   TSH 4.36 10/04/2013   INR 1.01 01/21/2013    BNP (last 3 results) No results for input(s): BNP in the last 8760 hours.  ProBNP (last 3 results) No results for input(s): PROBNP in the last 8760 hours.   Other Studies Reviewed Today:  Echo Study Conclusions from 2014  - Left ventricle: The cavity size was normal. Wall thickness was increased in a pattern of moderate LVH. There was mild focal basal hypertrophy of the septum. Systolic function was normal. The estimated ejection fraction was in the range of 55% to 60%. Wall motion was normal; there were no regional wall motion abnormalities. Doppler parameters are consistent with abnormal left ventricular relaxation (grade 1 diastolic dysfunction). - Aortic valve: Valve mobility was restricted. There was mild stenosis. Mild regurgitation. Valve area: 1.6cm^2(VTI). Valve area: 1.48cm^2 (Vmax).  CT Angio CHEST  IMPRESSION FROM 01/2013: Unchanged thoracic aneurysm with maximal measurement 44 mm. Severe atherosclerosis with ulcerated plaque along the arch but no acute abnormalities.    Assessment/Plan: 1. 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   2. Hypercholesterolemia - will check labs at her next visit   3. Myasthenia gravis followed at [facility]  4. Past history of spinal stenosis with chronic low back pain. Implantation of spinal cord stimulator at [facility] on 01/27/14 - seeing provider at [facility] today.   5.  Frequent falls and poor balance  6. History of fusiform aneurysm of ascending aorta 4 cm - last measured in 2014.   Continue conservative management.    Current medicines are reviewed with the patient today.  The patient does not have concerns regarding medicines other than what has been noted above.  The following changes have been made:  See above.  Labs/ tests ordered today include:    No orders of the defined types were placed in this encounter.    Mertie Moores, MD  08/22/2016 2:33 PM    [address],  [place], [place]  [address] Pager [phone] Phone: [phone]; Fax: [phone]

## 2016-08-22 NOTE — Patient Instructions (Signed)
Medication Instructions:  Your physician recommends that you continue on your current medications as directed. Please refer to the Current Medication list given to you today.   Labwork: None Ordered   Testing/Procedures: None Ordered   Follow-Up: Your physician wants you to follow-up in: 6 months with Dr. Nahser.  You will receive a reminder letter in the mail two months in advance. If you don't receive a letter, please call our office to schedule the follow-up appointment.   If you need a refill on your cardiac medications before your next appointment, please call your pharmacy.   Thank you for choosing [facility]! Huxley Shurley, RN [phone]    

## 2016-08-28 ENCOUNTER — Encounter: Payer: Self-pay | Admitting: Internal Medicine

## 2016-08-28 DIAGNOSIS — M810 Age-related osteoporosis without current pathological fracture: Secondary | ICD-10-CM | POA: Insufficient documentation

## 2016-08-28 DIAGNOSIS — M8580 Other specified disorders of bone density and structure, unspecified site: Secondary | ICD-10-CM | POA: Insufficient documentation

## 2016-08-29 ENCOUNTER — Encounter: Payer: Self-pay | Admitting: Emergency Medicine

## 2016-09-12 DIAGNOSIS — Z23 Encounter for immunization: Secondary | ICD-10-CM | POA: Diagnosis not present

## 2016-10-18 ENCOUNTER — Other Ambulatory Visit (INDEPENDENT_AMBULATORY_CARE_PROVIDER_SITE_OTHER): Payer: Medicare Other

## 2016-10-18 ENCOUNTER — Ambulatory Visit (INDEPENDENT_AMBULATORY_CARE_PROVIDER_SITE_OTHER): Payer: Medicare Other | Admitting: Internal Medicine

## 2016-10-18 ENCOUNTER — Encounter: Payer: Self-pay | Admitting: Internal Medicine

## 2016-10-18 VITALS — BP 120/78 | HR 82 | Temp 98.2°F | Resp 20 | Ht 65.0 in | Wt 154.0 lb

## 2016-10-18 DIAGNOSIS — R040 Epistaxis: Secondary | ICD-10-CM

## 2016-10-18 LAB — CBC
HCT: 35.6 % — ABNORMAL LOW (ref 36.0–46.0)
Hemoglobin: 12.1 g/dL (ref 12.0–15.0)
MCHC: 33.8 g/dL (ref 30.0–36.0)
MCV: 95.6 fl (ref 78.0–100.0)
Platelets: 246 10*3/uL (ref 150.0–400.0)
RBC: 3.73 Mil/uL — ABNORMAL LOW (ref 3.87–5.11)
RDW: 14.3 % (ref 11.5–15.5)
WBC: 8.4 10*3/uL (ref 4.0–10.5)

## 2016-10-18 NOTE — Patient Instructions (Signed)
We will have you in to see the ear nose and throat specialist about the nosebleeds.   Stop taking the baby aspirin (81 mg) until we can stop the nosebleeds.   We are checking the labs today to make sure your blood counts are normal.   Do as you have been to limit the excedrin and take more of the tylenol for the pain.   You can use affrin to help stop a nosebleed when it starts.   When a nosebleed starts either hold pressure to the bridge of the nose for 10 minutes or pack the nose and leave in place for 2 hours before trying to remove.   If you cannot get a nosebleed to stop seek medical care.

## 2016-10-18 NOTE — Progress Notes (Signed)
Pre visit review using our clinic review tool, if applicable. No additional management support is needed unless otherwise documented below in the visit note. 

## 2016-10-18 NOTE — Progress Notes (Signed)
   Subjective:    Patient ID: Tamara Gregory, female    DOB: 1925/08/18, 82 y.o.   MRN: [id number]  HPI The patient is an 82 YO female coming in for nosebleeds. She has had about 8-10 in the last month or so. She is taking baby aspirin and several excedrin daily. She denies picking or scratching nose. She does not take nose sprays or cold medication. She does not take any NSAIDs.   Review of Systems  Constitutional: Negative.   HENT: Positive for nosebleeds.   Respiratory: Negative.   Cardiovascular: Negative.   Gastrointestinal: Negative.   Neurological: Negative.   Hematological: Bruises/bleeds easily.      Objective:   Physical Exam  Constitutional: She is oriented to person, place, and time. She appears well-developed and well-nourished.  HENT:  Head: Normocephalic and atraumatic.  Eyes: EOM are normal.  Neck: Normal range of motion.  Cardiovascular: Normal rate and regular rhythm.   Pulmonary/Chest: Effort normal. No respiratory distress. She has no wheezes.  Abdominal: Soft. She exhibits no distension. There is no tenderness. There is no rebound.  Neurological: She is alert and oriented to person, place, and time.  Skin: Skin is warm and dry.   Vitals:   10/18/16 1532  BP: 120/78  Pulse: 82  Resp: 20  Temp: 98.2 F (36.8 C)  TempSrc: Oral  SpO2: 96%  Weight: 154 lb (69.9 kg)  Height: 5\' 5"  (1.651 m)      Assessment & Plan:

## 2016-10-19 DIAGNOSIS — R040 Epistaxis: Secondary | ICD-10-CM | POA: Insufficient documentation

## 2016-10-19 NOTE — Assessment & Plan Note (Signed)
Will stop her baby aspirin and refer to ENT to see if there are any surface vessels that can be ablated. She will work on taking tylenol and not the excedrin as much as possible. We talked about methods to stop bleeding and checking CBC today.

## 2016-10-21 DIAGNOSIS — R040 Epistaxis: Secondary | ICD-10-CM | POA: Diagnosis not present

## 2016-10-23 ENCOUNTER — Other Ambulatory Visit: Payer: Self-pay | Admitting: Anesthesiology

## 2016-10-23 DIAGNOSIS — L570 Actinic keratosis: Secondary | ICD-10-CM | POA: Diagnosis not present

## 2016-10-23 DIAGNOSIS — C44629 Squamous cell carcinoma of skin of left upper limb, including shoulder: Secondary | ICD-10-CM | POA: Diagnosis not present

## 2016-10-23 DIAGNOSIS — Z85820 Personal history of malignant melanoma of skin: Secondary | ICD-10-CM | POA: Diagnosis not present

## 2016-10-23 DIAGNOSIS — D1801 Hemangioma of skin and subcutaneous tissue: Secondary | ICD-10-CM | POA: Diagnosis not present

## 2016-10-23 DIAGNOSIS — M48062 Spinal stenosis, lumbar region with neurogenic claudication: Secondary | ICD-10-CM

## 2016-10-23 DIAGNOSIS — D485 Neoplasm of uncertain behavior of skin: Secondary | ICD-10-CM | POA: Diagnosis not present

## 2016-10-23 DIAGNOSIS — Z85828 Personal history of other malignant neoplasm of skin: Secondary | ICD-10-CM | POA: Diagnosis not present

## 2016-10-23 DIAGNOSIS — L814 Other melanin hyperpigmentation: Secondary | ICD-10-CM | POA: Diagnosis not present

## 2016-10-23 DIAGNOSIS — L821 Other seborrheic keratosis: Secondary | ICD-10-CM | POA: Diagnosis not present

## 2016-10-28 ENCOUNTER — Other Ambulatory Visit: Payer: Self-pay | Admitting: Anesthesiology

## 2016-10-28 DIAGNOSIS — M48062 Spinal stenosis, lumbar region with neurogenic claudication: Secondary | ICD-10-CM

## 2016-11-04 DIAGNOSIS — M545 Low back pain: Secondary | ICD-10-CM | POA: Diagnosis not present

## 2016-11-04 DIAGNOSIS — M62830 Muscle spasm of back: Secondary | ICD-10-CM | POA: Diagnosis not present

## 2016-11-04 DIAGNOSIS — M9903 Segmental and somatic dysfunction of lumbar region: Secondary | ICD-10-CM | POA: Diagnosis not present

## 2016-11-05 ENCOUNTER — Encounter: Payer: Self-pay | Admitting: Internal Medicine

## 2016-11-05 DIAGNOSIS — C4492 Squamous cell carcinoma of skin, unspecified: Secondary | ICD-10-CM | POA: Insufficient documentation

## 2016-11-06 ENCOUNTER — Encounter: Payer: Self-pay | Admitting: Neurology

## 2016-11-06 ENCOUNTER — Ambulatory Visit (INDEPENDENT_AMBULATORY_CARE_PROVIDER_SITE_OTHER): Payer: Medicare Other | Admitting: Neurology

## 2016-11-06 VITALS — BP 110/70 | HR 88 | Ht 63.0 in | Wt 157.0 lb

## 2016-11-06 DIAGNOSIS — M9903 Segmental and somatic dysfunction of lumbar region: Secondary | ICD-10-CM | POA: Diagnosis not present

## 2016-11-06 DIAGNOSIS — G7000 Myasthenia gravis without (acute) exacerbation: Secondary | ICD-10-CM | POA: Diagnosis not present

## 2016-11-06 DIAGNOSIS — M62830 Muscle spasm of back: Secondary | ICD-10-CM | POA: Diagnosis not present

## 2016-11-06 DIAGNOSIS — M545 Low back pain: Secondary | ICD-10-CM | POA: Diagnosis not present

## 2016-11-06 MED ORDER — PREDNISONE 5 MG PO TABS: 5.0000 mg | ORAL_TABLET | Freq: Every day | ORAL | 3 refills | Status: DC

## 2016-11-06 NOTE — Progress Notes (Signed)
[facility] Clinic Note - Initial Visit   Date: 11/06/16  Tamara Gregory MRN: [id number] DOB: [birth year]   Dear Dr. Quay Burow:  Thank you for your kind referral of Tamara Gregory for consultation of ocular myasthenia gravis. Although her history is well known to you, please allow [place] to reiterate it for the purpose of our medical record. The patient was accompanied to the clinic by husband who also provides collateral information.     History of Present Illness: Tamara Gregory is a 82 y.o. Caucasian female with hypertension, depression, GERD, hyperlipidemia, and chronic low back pain s/p spinal cord stimulator presenting for evaluation of medication management of ocular myasthenia.  She was diagnosed with myasthenia in 2013 by Dr. Vallarie Mare at [facility] by AChR antibody testing. CT chest was negative.  Symptoms manifested with R > L ptosis.  She did not have any double vision, difficulty swallowing/talking, shortness of breath, or limb weakness.  She did not benefit with mestinon and discontinued this due to symptoms of malaise.  Prednisone 20mg  was started and slowly tapered to 5mg  in December 2014.  She has been on this dose for the past 3 years and reports having no exacerbations with MG.  She continues to have mild ptosis bilaterally, but it does not bother her.  Again, she denies double vision.   Out-side paper records, electronic medical record, and images have been reviewed where available and summarized as:  Labs 01/2012:  + ACh R antibody at .81 (low range of + >.3)  Lab Results  Component Value Date   TSH 4.36 10/04/2013   Lab Results  Component Value Date   NA 139 05/06/2016   K 4.2 05/06/2016   CL 101 05/06/2016   CO2 33 (H) 05/06/2016     Past Medical History:  Diagnosis Date  . Arthritis   . Cancer (HCC)    hx of skin cancer   . Depression   . GERD (gastroesophageal reflux disease)   . Headache(784.0)   . History of migraines   .  Hyperlipidemia   . Hypertension   . Macular degeneration   . Myasthenia gravis ([place])   . Spinal stenosis   . Thoracic aneurysm    4.4cm; see CT done 01/28/12 and 01/22/13 in [facility].   [name] Uses hearing aid   . Vitamin D deficiency     Past Surgical History:  Procedure Laterality Date  . ABDOMINAL HYSTERECTOMY    . CHOLECYSTECTOMY    . LUMBAR LAMINECTOMY/DECOMPRESSION MICRODISCECTOMY N/A 02/04/2013   Procedure: CENTRAL DECOMPRESSION/LUMBAR LAMINECTOMY L3-L4 AND L4-L5 2 LEVELS;  Surgeon: Tobi Bastos, MD;  Location: [facility];  Service: Orthopedics;  Laterality: N/A;  . TONSILLECTOMY    . US ECHOCARDIOGRAPHY  05/18/2007   EF 55-60%     Medications:  Outpatient Encounter Prescriptions as of 11/06/2016  Medication Sig Note  . amLODipine (NORVASC) 5 MG tablet Take 1/2 tablet by mouth daily   . Calcium Carbonate (CALCIUM 500 PO) Take 1 capsule by mouth daily.    . cholecalciferol (VITAMIN D) 1000 UNITS tablet Take 2,000 Units by mouth daily.    . DULoxetine (CYMBALTA) 20 MG capsule Take 1 capsule (20 mg total) by mouth daily.   . folic acid (FOLVITE) A999333 MCG tablet Take 400 mcg by mouth daily.   . Glucosamine-Chondroit-Vit C-Mn (GLUCOSAMINE 1500 COMPLEX PO) Take 1 tablet by mouth daily.    [name] latanoprost (XALATAN) 0.005 % ophthalmic solution Place 1 drop into the left eye  at bedtime. 05/11/2015: Received from: External Pharmacy Received Sig:   . losartan-hydrochlorothiazide (HYZAAR) 100-25 MG tablet TAKE 1 TABLET EVERY DAY BEFORE BREAKFAST   . Multiple Vitamins-Minerals (PRESERVISION AREDS) CAPS Take 2 capsules by mouth daily.   . Omega-3 Fatty Acids (FISH OIL) 435 MG CAPS Take 1 capsule by mouth daily.   [name] omeprazole (PRILOSEC) 40 MG capsule Take 1 capsule (40 mg total) by mouth daily. Take 30 minutes before a meal   . predniSONE (DELTASONE) 5 MG tablet Take 1 tablet (5 mg total) by mouth daily with breakfast.   . rosuvastatin (CRESTOR) 5 MG tablet Take 1 tablet (5 mg total) by mouth daily.   [name]  tolterodine (DETROL LA) 2 MG 24 hr capsule take 1 capsule by mouth once daily   . [DISCONTINUED] predniSONE (DELTASONE) 5 MG tablet Take 5 mg by mouth daily with breakfast.    No facility-administered encounter medications on file as of 11/06/2016.      Allergies: No Known Allergies  Family History: Family History  Problem Relation Age of Onset  . Heart attack Father   . Cancer Sister   . Cancer Brother   . Cancer Sister   . Cancer Sister     Social History: Social History  Substance Use Topics  . Smoking status: Never Smoker  . Smokeless tobacco: Never Used  . Alcohol use No   Social History   Social History Narrative   Lives with husband in a one story home.  Has 2 sons.  Retired.  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  Education: college.  [employer].    Review of Systems:  CONSTITUTIONAL: No fevers, chills, night sweats, or weight loss.   EYES: No visual changes or eye pain ENT: No hearing changes.  No history of nose bleeds.   RESPIRATORY: No cough, wheezing and shortness of breath.   CARDIOVASCULAR: Negative for chest pain, and palpitations.   GI: Negative for abdominal discomfort, blood in stools or black stools.  No recent change in bowel habits.   GU:  No history of incontinence.   MUSCLOSKELETAL: No history of joint pain or swelling.  No myalgias.   SKIN: +for lesions, rash, and itching.   HEMATOLOGY/ONCOLOGY: Negative for prolonged bleeding, bruising easily, and swollen nodes.  No history of cancer.   ENDOCRINE: Negative for cold or heat intolerance, polydipsia or goiter.   PSYCH:  No depression or anxiety symptoms.   NEURO: As Above.   Vital Signs:  BP 110/70   Pulse 88   Ht 5\' 3"  (1.6 m)   Wt 157 lb (71.2 kg)   SpO2 93%   BMI 27.81 kg/m    General Medical Exam:   General:  Well appearing, comfortable.   Eyes/ENT: see cranial nerve examination.   Neck: No masses appreciated.  Full range of motion without tenderness.  No  carotid bruits. Respiratory:  Clear to auscultation, good air entry bilaterally.   Cardiac:  Regular rate and rhythm, no murmur.   Extremities:  No deformities, edema, or skin discoloration.  Skin:  No rashes or lesions.  Neurological Exam: MENTAL STATUS including orientation to time, place, person, recent and remote memory, attention span and concentration, language, and fund of knowledge is normal.  Speech is not dysarthric.  CRANIAL NERVES: II:  No visual field defects.  Unremarkable fundi.   III-IV-VI: Pupils equal round and reactive to light.  Normal conjugate, extra-ocular eye movements in all directions of gaze.  No nystagmus. Moderate  right ptosis with upper lid at the level of the pupil. Left ptosis is mild and eyelid above the pupil.  Ptosis is worse with sustained up gaze.  V:  Normal facial sensation.   VII:  Normal facial symmetry and movements.  Orbicularis oculi, orbicularis oris, buccinator is 5/5 VIII:  Normal hearing and vestibular function.   IX-X:  Normal palatal movement.   XI:  Normal shoulder shrug and head rotation.   XII:  Normal tongue strength and range of motion, no deviation or fasciculation.  MOTOR: Motor strength is 5/5 throughout except 4+/5 intrinsic hand weakness bilaterally. Neck flexion is 5/5.  There is generalized loos of muscle bulk throughout.  No pronator drift.  Tone is normal.    MSRs:    Right                                                                 Left brachioradialis 2+  brachioradialis 2+  biceps 2+  biceps 2+  triceps 2+  triceps 2+  patellar 2+  patellar 2+  ankle jerk 0  ankle jerk 0  Hoffman no  Hoffman no  plantar response down  plantar response down   SENSORY: Vibration is absent distally in the feet.    COORDINATION/GAIT: Normal finger-to- nose-finger.  Intact rapid alternating movements bilaterally.  Gait is slow, stooped, and unassisted.   IMPRESSION: Seropositive ocular myasthenia gravis (diagnosed 2013 at [facility])   - No history of generalized MG or MG crisis  - The highest dose of prednisone was 20mg  when she was initially diagnosed which has been tapered to 5mg  and she remains stable   - Clinically, she continues to have R >> L ptosis without any other bulbar or limb weakness.    - I discussed options of management (1) continue prednisone 5mg  since she is still symptomatic or (2) cautiously taper her to 5mg  alternating with 2.5mg  to keep her on the lowest dose of prednisone needed.  She was made aware that with tapering her medication the risk of worsening ptosis is present, but if we are able to lower her dose long term effects of corticosteroid use can be reduced.    - Patient does not wish to reduce the dose and would like to remain on prednisone 5mg  daily, which is reasonable  Return to clinic in 1 year.   The duration of this appointment visit was 45 minutes of face-to-face time with the patient.  Greater than 50% of this time was spent in counseling, explanation of diagnosis, planning of further management, and coordination of care.   Thank you for allowing me to participate in patient's care.  If I can answer any additional questions, I would be pleased to do so.    Sincerely,    Leeyah Heather K. Posey Pronto, DO

## 2016-11-06 NOTE — Patient Instructions (Addendum)
Continue prednisone 5mg  daily Return to clinic 1 year

## 2016-11-07 ENCOUNTER — Other Ambulatory Visit: Payer: Medicare Other

## 2016-11-07 DIAGNOSIS — M9903 Segmental and somatic dysfunction of lumbar region: Secondary | ICD-10-CM | POA: Diagnosis not present

## 2016-11-07 DIAGNOSIS — M545 Low back pain: Secondary | ICD-10-CM | POA: Diagnosis not present

## 2016-11-07 DIAGNOSIS — M62830 Muscle spasm of back: Secondary | ICD-10-CM | POA: Diagnosis not present

## 2016-11-11 DIAGNOSIS — M545 Low back pain: Secondary | ICD-10-CM | POA: Diagnosis not present

## 2016-11-11 DIAGNOSIS — M62830 Muscle spasm of back: Secondary | ICD-10-CM | POA: Diagnosis not present

## 2016-11-11 DIAGNOSIS — M9903 Segmental and somatic dysfunction of lumbar region: Secondary | ICD-10-CM | POA: Diagnosis not present

## 2016-11-12 ENCOUNTER — Other Ambulatory Visit: Payer: Self-pay | Admitting: Emergency Medicine

## 2016-11-12 MED ORDER — ROSUVASTATIN CALCIUM 5 MG PO TABS: 5.0000 mg | ORAL_TABLET | Freq: Every day | ORAL | 2 refills | Status: DC

## 2016-11-14 DIAGNOSIS — M62830 Muscle spasm of back: Secondary | ICD-10-CM | POA: Diagnosis not present

## 2016-11-14 DIAGNOSIS — M545 Low back pain: Secondary | ICD-10-CM | POA: Diagnosis not present

## 2016-11-14 DIAGNOSIS — M9903 Segmental and somatic dysfunction of lumbar region: Secondary | ICD-10-CM | POA: Diagnosis not present

## 2016-11-19 ENCOUNTER — Encounter: Payer: Self-pay | Admitting: Internal Medicine

## 2016-11-19 DIAGNOSIS — H35373 Puckering of macula, bilateral: Secondary | ICD-10-CM | POA: Diagnosis not present

## 2016-11-19 DIAGNOSIS — H353130 Nonexudative age-related macular degeneration, bilateral, stage unspecified: Secondary | ICD-10-CM | POA: Diagnosis not present

## 2016-11-19 DIAGNOSIS — Z961 Presence of intraocular lens: Secondary | ICD-10-CM | POA: Diagnosis not present

## 2016-11-19 DIAGNOSIS — H401131 Primary open-angle glaucoma, bilateral, mild stage: Secondary | ICD-10-CM | POA: Diagnosis not present

## 2016-11-20 ENCOUNTER — Ambulatory Visit: Payer: Medicare Other | Admitting: Neurology

## 2016-11-26 ENCOUNTER — Other Ambulatory Visit: Payer: Self-pay | Admitting: Emergency Medicine

## 2016-11-26 MED ORDER — ROSUVASTATIN CALCIUM 5 MG PO TABS: 5.0000 mg | ORAL_TABLET | Freq: Every day | ORAL | 2 refills | Status: DC

## 2017-01-22 DIAGNOSIS — M545 Low back pain: Secondary | ICD-10-CM | POA: Diagnosis not present

## 2017-01-22 DIAGNOSIS — M9903 Segmental and somatic dysfunction of lumbar region: Secondary | ICD-10-CM | POA: Diagnosis not present

## 2017-01-22 DIAGNOSIS — M62830 Muscle spasm of back: Secondary | ICD-10-CM | POA: Diagnosis not present

## 2017-01-23 DIAGNOSIS — M9903 Segmental and somatic dysfunction of lumbar region: Secondary | ICD-10-CM | POA: Diagnosis not present

## 2017-01-23 DIAGNOSIS — M545 Low back pain: Secondary | ICD-10-CM | POA: Diagnosis not present

## 2017-01-23 DIAGNOSIS — M62830 Muscle spasm of back: Secondary | ICD-10-CM | POA: Diagnosis not present

## 2017-01-27 ENCOUNTER — Other Ambulatory Visit: Payer: Medicare Other

## 2017-01-27 DIAGNOSIS — M9903 Segmental and somatic dysfunction of lumbar region: Secondary | ICD-10-CM | POA: Diagnosis not present

## 2017-01-27 DIAGNOSIS — M545 Low back pain: Secondary | ICD-10-CM | POA: Diagnosis not present

## 2017-01-27 DIAGNOSIS — M62830 Muscle spasm of back: Secondary | ICD-10-CM | POA: Diagnosis not present

## 2017-01-28 ENCOUNTER — Ambulatory Visit
Admission: RE | Admit: 2017-01-28 | Discharge: 2017-01-28 | Disposition: A | Discharge status: Home / Self Care | Payer: Medicare Other | Source: Ambulatory Visit | Attending: Anesthesiology | Admitting: Anesthesiology

## 2017-01-28 DIAGNOSIS — M48062 Spinal stenosis, lumbar region with neurogenic claudication: Secondary | ICD-10-CM

## 2017-01-28 DIAGNOSIS — M48061 Spinal stenosis, lumbar region without neurogenic claudication: Secondary | ICD-10-CM | POA: Diagnosis not present

## 2017-01-28 MED ORDER — ONDANSETRON HCL 4 MG/2ML IJ SOLN: 4.0000 mg | Freq: Four times a day (QID) | INTRAMUSCULAR | Status: DC | PRN

## 2017-01-28 MED ORDER — IOPAMIDOL (ISOVUE-M 200) INJECTION 41%
15.0000 mL | Freq: Once | INTRAMUSCULAR | Status: AC
  Administered 2017-01-28: 15 mL via INTRATHECAL

## 2017-01-28 NOTE — Discharge Instructions (Signed)
Myelogram Discharge Instructions  1. Go home and rest quietly for the next 24 hours.  It is important to lie flat for the next 24 hours.  Get up only to go to the restroom.  You may lie in the bed or on a couch on your back, your stomach, your left side or your right side.  You may have one pillow under your head.  You may have pillows between your knees while you are on your side or under your knees while you are on your back.  2. DO NOT drive today.  Recline the seat as far back as it will go, while still wearing your seat belt, on the way home.  3. You may get up to go to the bathroom as needed.  You may sit up for 10 minutes to eat.  You may resume your normal diet and medications unless otherwise indicated.  Drink lots of extra fluids today and tomorrow.  4. The incidence of headache, nausea, or vomiting is about 5% (one in 20 patients).  If you develop a headache, lie flat and drink plenty of fluids until the headache goes away.  Caffeinated beverages may be helpful.  If you develop severe nausea and vomiting or a headache that does not go away with flat bed rest, call [phone].  5. You may resume normal activities after your 24 hours of bed rest is over; however, do not exert yourself strongly or do any heavy lifting tomorrow. If when you get up you have a headache when standing, go back to bed and force fluids for another 24 hours.  6. Call your physician for a follow-up appointment.  The results of your myelogram will be sent directly to your physician by the following day.  7. If you have any questions or if complications develop after you arrive home, please call [phone].  Discharge instructions have been explained to the patient.  The patient, or the person responsible for the patient, fully understands these instructions.       May resume Cymbalta on Feb. 28, 2018, after 10:30 am.

## 2017-01-28 NOTE — Progress Notes (Signed)
Pt states she has been off Cymbalta for the past 2 days.

## 2017-01-30 DIAGNOSIS — M545 Low back pain: Secondary | ICD-10-CM | POA: Diagnosis not present

## 2017-01-30 DIAGNOSIS — M9903 Segmental and somatic dysfunction of lumbar region: Secondary | ICD-10-CM | POA: Diagnosis not present

## 2017-01-30 DIAGNOSIS — M62830 Muscle spasm of back: Secondary | ICD-10-CM | POA: Diagnosis not present

## 2017-02-05 DIAGNOSIS — M48062 Spinal stenosis, lumbar region with neurogenic claudication: Secondary | ICD-10-CM | POA: Diagnosis not present

## 2017-02-06 DIAGNOSIS — M48062 Spinal stenosis, lumbar region with neurogenic claudication: Secondary | ICD-10-CM | POA: Diagnosis not present

## 2017-02-10 ENCOUNTER — Encounter: Payer: Medicare Other | Admitting: Internal Medicine

## 2017-02-10 NOTE — Progress Notes (Signed)
Subjective:    Patient ID: Tamara Gregory, female    DOB: 1925/01/05, 83 y.o.   MRN: [id number]  HPI   Medications and allergies reviewed with patient and updated if appropriate.  Patient Active Problem List   Diagnosis Date Noted  . Squamous cell skin cancer 11/05/2016  . Nosebleed 10/19/2016  . Osteopenia 08/28/2016  . Essential hypertension, benign 08/21/2016  . Long term current use of systemic steroids 08/21/2016  . GERD (gastroesophageal reflux disease) 05/06/2016  . Mixed incontinence urge and stress 05/06/2016  . Low hemoglobin 09/22/2014  . Insomnia 07/26/2014  . Malaise and fatigue 10/04/2013  . PAC (premature atrial contraction) 10/04/2013  . Depression (emotion) 02/15/2013  . Spinal stenosis, lumbar region, with neurogenic claudication 02/04/2013  . Myasthenia gravis ([place]) 03/17/2012  . Pure hypercholesterolemia 09/05/2011  . Benign hypertensive heart disease without heart failure 09/05/2011  . Osteoarthritis 09/05/2011    Current Outpatient Prescriptions on File Prior to Visit  Medication Sig Dispense Refill  . amLODipine (NORVASC) 5 MG tablet Take 1/2 tablet by mouth daily    . Calcium Carbonate (CALCIUM 500 PO) Take 1 capsule by mouth daily.     . cholecalciferol (VITAMIN D) 1000 UNITS tablet Take 2,000 Units by mouth daily.     . DULoxetine (CYMBALTA) 20 MG capsule Take 1 capsule (20 mg total) by mouth daily. 90 capsule 3  . folic acid (FOLVITE) 542 MCG tablet Take 400 mcg by mouth daily.    . Glucosamine-Chondroit-Vit C-Mn (GLUCOSAMINE 1500 COMPLEX PO) Take 1 tablet by mouth daily.     [name] latanoprost (XALATAN) 0.005 % ophthalmic solution Place 1 drop into the left eye at bedtime.  1  . losartan-hydrochlorothiazide (HYZAAR) 100-25 MG tablet TAKE 1 TABLET EVERY DAY BEFORE BREAKFAST 90 tablet 2  . Multiple Vitamins-Minerals (PRESERVISION AREDS) CAPS Take 2 capsules by mouth daily.    . Omega-3 Fatty Acids (FISH OIL) 435 MG CAPS Take 1 capsule by mouth daily.     [name] omeprazole (PRILOSEC) 40 MG capsule Take 1 capsule (40 mg total) by mouth daily. Take 30 minutes before a meal 90 capsule 3  . predniSONE (DELTASONE) 5 MG tablet Take 1 tablet (5 mg total) by mouth daily with breakfast. 90 tablet 3  . rosuvastatin (CRESTOR) 5 MG tablet Take 1 tablet (5 mg total) by mouth daily. 90 tablet 2  . tolterodine (DETROL LA) 2 MG 24 hr capsule take 1 capsule by mouth once daily 30 capsule 11   No current facility-administered medications on file prior to visit.     Past Medical History:  Diagnosis Date  . Arthritis   . Cancer (HCC)    hx of skin cancer   . Depression   . GERD (gastroesophageal reflux disease)   . Headache(784.0)   . History of migraines   . Hyperlipidemia   . Hypertension   . Macular degeneration   . Myasthenia gravis ([place])   . Spinal stenosis   . Thoracic aneurysm    4.4cm; see CT done 01/28/12 and 01/22/13 in [facility].   [name] Uses hearing aid   . Vitamin D deficiency     Past Surgical History:  Procedure Laterality Date  . ABDOMINAL HYSTERECTOMY    . CHOLECYSTECTOMY    . LUMBAR LAMINECTOMY/DECOMPRESSION MICRODISCECTOMY N/A 02/04/2013   Procedure: CENTRAL DECOMPRESSION/LUMBAR LAMINECTOMY L3-L4 AND L4-L5 2 LEVELS;  Surgeon: Tobi Bastos, MD;  Location: [facility];  Service: Orthopedics;  Laterality: N/A;  . TONSILLECTOMY    . US  ECHOCARDIOGRAPHY  05/18/2007   EF 55-60%    Social History   Social History  . Marital status: Married    Spouse name: N/A  . Number of children: N/A  . Years of education: N/A   Social History Main Topics  . Smoking status: Never Smoker  . Smokeless tobacco: Never Used  . Alcohol use No  . Drug use: No  . Sexual activity: Not on file   Other Topics Concern  . Not on file   Social History Narrative   Lives with husband in a one story home.  Has 2 sons.  Retired.  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  Education: college.  [employer].    Family History  Problem Relation  Age of Onset  . Heart attack Father   . Cancer Sister   . Cancer Brother   . Cancer Sister   . Cancer Sister     Review of Systems     Objective:  There were no vitals filed for this visit. Wt Readings from Last 3 Encounters:  11/06/16 157 lb (71.2 kg)  10/18/16 154 lb (69.9 kg)  08/22/16 154 lb 3.2 oz (69.9 kg)   There is no height or weight on file to calculate BMI.   Physical Exam         Assessment & Plan:    See Problem List for Assessment and Plan of chronic medical problems.    This encounter was created in error - please disregard.

## 2017-02-11 ENCOUNTER — Encounter: Payer: Self-pay | Admitting: Internal Medicine

## 2017-02-11 ENCOUNTER — Ambulatory Visit (INDEPENDENT_AMBULATORY_CARE_PROVIDER_SITE_OTHER): Payer: Medicare Other | Admitting: Internal Medicine

## 2017-02-11 VITALS — BP 132/86 | HR 99 | Temp 98.1°F | Resp 16 | Wt 157.0 lb

## 2017-02-11 DIAGNOSIS — E7800 Pure hypercholesterolemia, unspecified: Secondary | ICD-10-CM

## 2017-02-11 DIAGNOSIS — G4700 Insomnia, unspecified: Secondary | ICD-10-CM | POA: Diagnosis not present

## 2017-02-11 DIAGNOSIS — N3946 Mixed incontinence: Secondary | ICD-10-CM

## 2017-02-11 DIAGNOSIS — G7000 Myasthenia gravis without (acute) exacerbation: Secondary | ICD-10-CM | POA: Diagnosis not present

## 2017-02-11 DIAGNOSIS — I10 Essential (primary) hypertension: Secondary | ICD-10-CM | POA: Diagnosis not present

## 2017-02-11 DIAGNOSIS — F3289 Other specified depressive episodes: Secondary | ICD-10-CM

## 2017-02-11 DIAGNOSIS — K219 Gastro-esophageal reflux disease without esophagitis: Secondary | ICD-10-CM | POA: Diagnosis not present

## 2017-02-11 MED ORDER — TRAZODONE HCL 50 MG PO TABS: 25.0000 mg | ORAL_TABLET | Freq: Every evening | ORAL | 1 refills | Status: DC | PRN

## 2017-02-11 MED ORDER — MIRABEGRON ER 25 MG PO TB24: 25.0000 mg | ORAL_TABLET | Freq: Every day | ORAL | 1 refills | Status: DC

## 2017-02-11 MED ORDER — RANITIDINE HCL 150 MG PO TABS: 150.0000 mg | ORAL_TABLET | Freq: Every day | ORAL | 1 refills | Status: DC

## 2017-02-11 NOTE — Assessment & Plan Note (Signed)
Stressed sleep hygiene, no naps during day Trial of trazodone at night - 25 mg nightly, increase to 50 mg nightly if needed

## 2017-02-11 NOTE — Assessment & Plan Note (Signed)
detrol not effective - will d/c Start myrbetriq

## 2017-02-11 NOTE — Patient Instructions (Addendum)
Changes include:  Tapering off the omeprazole (prilosec) and starting zantac 150 mg daily (sent to your pharmacy)  Hold the cymbalta for now to see if that is the cause of the hot flashes.  Start trazodone at night for sleep - try 25 mg nightly.  If that does not work try 50 mg nightly.   Stop the detrol (bladder medication) and try a different one ( myrbetriq)   Follow up in 4 weeks

## 2017-02-11 NOTE — Assessment & Plan Note (Signed)
BP well controlled Current regimen effective and well tolerated Continue current medications at current doses  

## 2017-02-11 NOTE — Progress Notes (Signed)
Pre visit review using our clinic review tool, if applicable. No additional management support is needed unless otherwise documented below in the visit note. 

## 2017-02-11 NOTE — Assessment & Plan Note (Signed)
Taking crestor daily continue

## 2017-02-11 NOTE — Assessment & Plan Note (Signed)
Controlled, but hot flashes/sweats may be a side effect from cymbalta Will hold cymbalta and see if sweats go away  - if not will evaluate further / get blood work at next visit

## 2017-02-11 NOTE — Progress Notes (Signed)
Subjective:    Patient ID: Tamara Gregory, female    DOB: [birth year], 83 y.o.   MRN: [id number]  HPI The patient is here for follow up.  Insomnia:  She does not sleep well at night.  This is not a new problem.  She was on medication in the past, ? BDZ.  She does rest during the day because of her back pain - sometimes she sleeps, but not always.   Hot flashes:  She will have soaking in the back of her head and her whole body.  It happens about twice a day.  Usually with actiivty.  It started about two years ago.   Depression:  She is taking cymbalta daily.  She denies depression.  She feels the medication works well.   Hypertension: She is taking her medication daily. She is compliant with a low sodium diet.  She denies chest pain, palpitations, edema, shortness of breath and regular headaches. She is not exercising regularly.    Overactive bladder:  She is taking detrol la, but does not feel it is working well.  She does not recall if she was on anything else in the past.  She is willing to try something different.     Medications and allergies reviewed with patient and updated if appropriate.  Patient Active Problem List   Diagnosis Date Noted  . Squamous cell skin cancer 11/05/2016  . Nosebleed 10/19/2016  . Osteopenia 08/28/2016  . Essential hypertension, benign 08/21/2016  . Long term current use of systemic steroids 08/21/2016  . GERD (gastroesophageal reflux disease) 05/06/2016  . Mixed incontinence urge and stress 05/06/2016  . Low hemoglobin 09/22/2014  . Insomnia 07/26/2014  . Malaise and fatigue 10/04/2013  . PAC (premature atrial contraction) 10/04/2013  . Depression (emotion) 02/15/2013  . Spinal stenosis, lumbar region, with neurogenic claudication 02/04/2013  . Myasthenia gravis ([place]) 03/17/2012  . Pure hypercholesterolemia 09/05/2011  . Benign hypertensive heart disease without heart failure 09/05/2011  . Osteoarthritis 09/05/2011    Current Outpatient  Prescriptions on File Prior to Visit  Medication Sig Dispense Refill  . amLODipine (NORVASC) 5 MG tablet Take 1/2 tablet by mouth daily    . Calcium Carbonate (CALCIUM 500 PO) Take 1 capsule by mouth daily.     . cholecalciferol (VITAMIN D) 1000 UNITS tablet Take 2,000 Units by mouth daily.     . DULoxetine (CYMBALTA) 20 MG capsule Take 1 capsule (20 mg total) by mouth daily. 90 capsule 3  . folic acid (FOLVITE) 010 MCG tablet Take 400 mcg by mouth daily.    . Glucosamine-Chondroit-Vit C-Mn (GLUCOSAMINE 1500 COMPLEX PO) Take 1 tablet by mouth daily.     [name] latanoprost (XALATAN) 0.005 % ophthalmic solution Place 1 drop into the left eye at bedtime.  1  . losartan-hydrochlorothiazide (HYZAAR) 100-25 MG tablet TAKE 1 TABLET EVERY DAY BEFORE BREAKFAST 90 tablet 2  . Multiple Vitamins-Minerals (PRESERVISION AREDS) CAPS Take 2 capsules by mouth daily.    . Omega-3 Fatty Acids (FISH OIL) 435 MG CAPS Take 1 capsule by mouth daily.    [name] omeprazole (PRILOSEC) 40 MG capsule Take 1 capsule (40 mg total) by mouth daily. Take 30 minutes before a meal 90 capsule 3  . predniSONE (DELTASONE) 5 MG tablet Take 1 tablet (5 mg total) by mouth daily with breakfast. 90 tablet 3  . rosuvastatin (CRESTOR) 5 MG tablet Take 1 tablet (5 mg total) by mouth daily. 90 tablet 2  . tolterodine (DETROL LA) 2  MG 24 hr capsule take 1 capsule by mouth once daily 30 capsule 11   No current facility-administered medications on file prior to visit.     Past Medical History:  Diagnosis Date  . Arthritis   . Cancer (HCC)    hx of skin cancer   . Depression   . GERD (gastroesophageal reflux disease)   . Headache(784.0)   . History of migraines   . Hyperlipidemia   . Hypertension   . Macular degeneration   . Myasthenia gravis ([place])   . Spinal stenosis   . Thoracic aneurysm    4.4cm; see CT done 01/28/12 and 01/22/13 in [facility].   [name] Uses hearing aid   . Vitamin D deficiency     Past Surgical History:  Procedure Laterality  Date  . ABDOMINAL HYSTERECTOMY    . CHOLECYSTECTOMY    . LUMBAR LAMINECTOMY/DECOMPRESSION MICRODISCECTOMY N/A 02/04/2013   Procedure: CENTRAL DECOMPRESSION/LUMBAR LAMINECTOMY L3-L4 AND L4-L5 2 LEVELS;  Surgeon: Tobi Bastos, MD;  Location: [facility];  Service: Orthopedics;  Laterality: N/A;  . TONSILLECTOMY    . US ECHOCARDIOGRAPHY  05/18/2007   EF 55-60%    Social History   Social History  . Marital status: Married    Spouse name: N/A  . Number of children: N/A  . Years of education: N/A   Social History Main Topics  . Smoking status: Never Smoker  . Smokeless tobacco: Never Used  . Alcohol use No  . Drug use: No  . Sexual activity: Not on file   Other Topics Concern  . Not on file   Social History Narrative   Lives with husband in a one story home.  Has 2 sons.  Retired.  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  Education: college.  [employer].    Family History  Problem Relation Age of Onset  . Heart attack Father   . Cancer Sister   . Cancer Brother   . Cancer Sister   . Cancer Sister     Review of Systems  Constitutional: Negative for chills and fever.  Respiratory: Negative for cough, shortness of breath and wheezing.   Cardiovascular: Negative for chest pain, palpitations and leg swelling.  Musculoskeletal: Positive for back pain.  Neurological: Positive for headaches (occ). Negative for light-headedness.       Objective:   Vitals:   02/11/17 1634  BP: 132/86  Pulse: 99  Resp: 16  Temp: 98.1 F (36.7 C)   Wt Readings from Last 3 Encounters:  02/11/17 157 lb (71.2 kg)  11/06/16 157 lb (71.2 kg)  10/18/16 154 lb (69.9 kg)   Body mass index is 27.81 kg/m.   Physical Exam    Constitutional: Appears well-developed and well-nourished. No distress.  HENT:  Head: Normocephalic and atraumatic.  Neck: Neck supple. No tracheal deviation present. No thyromegaly present.  No cervical lymphadenopathy Cardiovascular: Normal rate,  regular rhythm and normal heart sounds.   No murmur heard. No carotid bruit .  No edema Pulmonary/Chest: Effort normal and breath sounds normal. No respiratory distress. No has no wheezes. No rales.  Skin: Skin is warm and dry. Not diaphoretic.  Psychiatric: Normal mood and affect. Behavior is normal.      Assessment & Plan:   Several medication changes made today -- follow up in 4-6 weeks  See Problem List for Assessment and Plan of chronic medical problems.

## 2017-02-11 NOTE — Assessment & Plan Note (Signed)
Following with Neurology - Dr Vallarie Mare On chronic steroids - prednisone

## 2017-02-11 NOTE — Assessment & Plan Note (Addendum)
No gerd symptoms Will taper off omeprazole and start zantac 150 mg bedtime given chronic prednisone use and recent GERD symptoms

## 2017-02-17 DIAGNOSIS — H401121 Primary open-angle glaucoma, left eye, mild stage: Secondary | ICD-10-CM | POA: Diagnosis not present

## 2017-02-18 ENCOUNTER — Ambulatory Visit: Payer: Medicare Other | Admitting: Internal Medicine

## 2017-02-19 ENCOUNTER — Ambulatory Visit: Payer: Medicare Other | Admitting: Internal Medicine

## 2017-02-24 ENCOUNTER — Encounter: Payer: Self-pay | Admitting: Cardiovascular Disease

## 2017-02-24 ENCOUNTER — Ambulatory Visit (INDEPENDENT_AMBULATORY_CARE_PROVIDER_SITE_OTHER): Payer: Medicare Other | Admitting: Cardiovascular Disease

## 2017-02-24 VITALS — BP 122/84 | HR 94 | Ht 63.0 in | Wt 161.4 lb

## 2017-02-24 DIAGNOSIS — M48062 Spinal stenosis, lumbar region with neurogenic claudication: Secondary | ICD-10-CM | POA: Diagnosis not present

## 2017-02-24 DIAGNOSIS — Z6826 Body mass index (BMI) 26.0-26.9, adult: Secondary | ICD-10-CM | POA: Diagnosis not present

## 2017-02-24 DIAGNOSIS — I119 Hypertensive heart disease without heart failure: Secondary | ICD-10-CM

## 2017-02-24 NOTE — Progress Notes (Signed)
CARDIOLOGY OFFICE NOTE  Date:  02/24/2017    Tamara Gregory Date of Birth: [birth year] Medical Record #[id number]  PCP:  Binnie Rail, MD  Cardiologist:  Former patient of Dr. Sherryl Barters.   Now [name]  Chief Complaint  Patient presents with  . Hypertension    Notes from Truitt Merle.  Tamara Gregory is a 83 y.o. female who presents today for a 4 month check. Former patient of Dr. Sherryl Barters.   She has a past history of HLD, HTN and myasthenia gravis and is followed for that at [facility]. She has had previous back surgery by Dr. Gladstone Lighter. She has a known 4 cm fusiform ascending aortic aneurysm . When the patient was initially admitted for her back surgery she had some chest pain in the preoperative holding area and her back surgery had to be postponed while she underwent a cardiac workup. She had a Myoview stress test which showed no ischemia. Her back surgery was subsequently rescheduled and performed without incident. She made a good recovery from her back surgery. However she has continued to have back pain and for this reason she underwent a implantation of a spinal cord stimulator as an outpatient at [facility] outpatient facility on 01/27/2014. The patient remains on low-dose prednisone 5 mg daily for her myasthenia gravis.   In March of 2015 she was hospitalized overnight at [facility] in [facility] for chest pain. She had a dobutamine stress echo the following morning which showed no evidence of ischemia and she was released. She was admitted with bradycardia and her metoprolol was stopped and has not been restarted.  Last visit back in November and she was felt to be doing well.   Comes back today. Here with her husband who is also being seen. She is mostly limited by her back pain - going to [place] later today. Taking at least 2 sometimes 3 Excedrins on a daily basis. Some belly pain. Husband notes stool has been  dark. She has early satiety. No chest pain. Breathing is good but she is more sedentary and will have some DOE with much activity. [place] the [place] - [place] there today. Has PCP arranged for June.   Sept. 21, 2017:  [name] is seen today for the first time.   Previous patient of [facility].  Has of HTN Doing well today .   Has a tens unit implanted in her hip   No CP or dyspnea   February 24, 2017:  Tamara Gregory is seen today with her husband Tamara Gregory her 89th birthday recently .  Hx of hyperlipidemia , HTN,  No cardiac issues.  Had some chest pain /  Indigestion. Resolved when she dranks some soda    Past Medical History:  Diagnosis Date  . Arthritis   . Cancer (HCC)    hx of skin cancer   . Depression   . GERD (gastroesophageal reflux disease)   . Headache(784.0)   . History of migraines   . Hyperlipidemia   . Hypertension   . Macular degeneration   . Myasthenia gravis ([place])   . Spinal stenosis   . Thoracic aneurysm    4.4cm; see CT done 01/28/12 and 01/22/13 in [facility].   [name] Uses hearing aid   . Vitamin D deficiency     Past Surgical History:  Procedure Laterality Date  . ABDOMINAL HYSTERECTOMY    . CHOLECYSTECTOMY    .  LUMBAR LAMINECTOMY/DECOMPRESSION MICRODISCECTOMY N/A 02/04/2013   Procedure: CENTRAL DECOMPRESSION/LUMBAR LAMINECTOMY L3-L4 AND L4-L5 2 LEVELS;  Surgeon: Tobi Bastos, MD;  Location: [facility];  Service: Orthopedics;  Laterality: N/A;  . TONSILLECTOMY    . US ECHOCARDIOGRAPHY  05/18/2007   EF 55-60%     Medications: Current Outpatient Prescriptions  Medication Sig Dispense Refill  . amLODipine (NORVASC) 5 MG tablet Take 1/2 tablet by mouth daily    . Calcium Carbonate (CALCIUM 500 PO) Take 1 capsule by mouth daily.     . cholecalciferol (VITAMIN D) 1000 UNITS tablet Take 2,000 Units by mouth daily.     . DULoxetine (CYMBALTA) 20 MG capsule Take 1 capsule (20 mg total) by mouth daily. 90 capsule 3  . folic acid (FOLVITE) 836 MCG  tablet Take 400 mcg by mouth daily.    . Glucosamine-Chondroit-Vit C-Mn (GLUCOSAMINE 1500 COMPLEX PO) Take 1 tablet by mouth daily.     [name] latanoprost (XALATAN) 0.005 % ophthalmic solution Place 1 drop into the left eye at bedtime.  1  . losartan-hydrochlorothiazide (HYZAAR) 100-25 MG tablet TAKE 1 TABLET EVERY DAY BEFORE BREAKFAST 90 tablet 2  . mirabegron ER (MYRBETRIQ) 25 MG TB24 tablet Take 1 tablet (25 mg total) by mouth daily. 90 tablet 1  . Multiple Vitamins-Minerals (PRESERVISION AREDS) CAPS Take 2 capsules by mouth daily.    . Omega-3 Fatty Acids (FISH OIL) 435 MG CAPS Take 1 capsule by mouth daily.    . predniSONE (DELTASONE) 5 MG tablet Take 1 tablet (5 mg total) by mouth daily with breakfast. 90 tablet 3  . ranitidine (ZANTAC) 150 MG tablet Take 1 tablet (150 mg total) by mouth at bedtime. 90 tablet 1  . rosuvastatin (CRESTOR) 5 MG tablet Take 1 tablet (5 mg total) by mouth daily. 90 tablet 2  . traZODone (DESYREL) 50 MG tablet Take 0.5-1 tablets (25-50 mg total) by mouth at bedtime as needed for sleep. 90 tablet 1   No current facility-administered medications for this visit.     Allergies: No Known Allergies  Social History: The patient  reports that she has never smoked. She has never used smokeless tobacco. She reports that she does not drink alcohol or use drugs.   Family History: The patient's family history includes Cancer in her brother, sister, sister, and sister; Heart attack in her father.   Review of Systems: Please see the history of present illness.   Otherwise, the review of systems is positive for none.   All other systems are reviewed and negative.   Physical Exam: VS:  BP 122/84 (BP Location: Left Arm, Patient Position: Sitting, Cuff Size: Normal)   Pulse 94   Ht 5\' 3"  (1.6 m)   Wt 161 lb 6.4 oz (73.2 kg)   SpO2 95%   BMI 28.59 kg/m  .  BMI Body mass index is 28.59 kg/m.  Wt Readings from Last 3 Encounters:  02/24/17 161 lb 6.4 oz (73.2 kg)    02/11/17 157 lb (71.2 kg)  11/06/16 157 lb (71.2 kg)    General: Pleasant. Elderly female. Kyphotic. She is alert and in no acute distress.   HEENT: Normal.  Neck: Supple, no JVD, carotid bruits, or masses noted.  Cardiac: Regular rate and rhythm. Soft outflow murmur.   Chronic stasis changes.   Very minimal ankle edema. Respiratory:  Lungs are clear to auscultation bilaterally with normal work of breathing.  GI: Soft and nontender.  MS: No deformity or atrophy. Gait and ROM intact.  Skin: Warm and dry. Color is normal.  Neuro:  Strength and sensation are intact and no gross focal deficits noted.  Psych: Alert, appropriate and with normal affect.   LABORATORY DATA:  EKG:  EKG is ordered today. NSR at 73.     Lab Results  Component Value Date   WBC 8.4 10/18/2016   HGB 12.1 10/18/2016   HCT 35.6 (L) 10/18/2016   PLT 246.0 10/18/2016   GLUCOSE 109 (H) 05/06/2016   CHOL 174 02/28/2016   TRIG 97 02/28/2016   HDL 77 02/28/2016   LDLDIRECT 139.3 10/04/2013   LDLCALC 78 02/28/2016   ALT 19 05/06/2016   AST 18 05/06/2016   NA 139 05/06/2016   K 4.2 05/06/2016   CL 101 05/06/2016   CREATININE 0.87 05/06/2016   BUN 25 (H) 05/06/2016   CO2 33 (H) 05/06/2016   TSH 4.36 10/04/2013   INR 1.01 01/21/2013    BNP (last 3 results) No results for input(s): BNP in the last 8760 hours.  ProBNP (last 3 results) No results for input(s): PROBNP in the last 8760 hours.   Other Studies Reviewed Today:  Echo Study Conclusions from 2014  - Left ventricle: The cavity size was normal. Wall thickness was increased in a pattern of moderate LVH. There was mild focal basal hypertrophy of the septum. Systolic function was normal. The estimated ejection fraction was in the range of 55% to 60%. Wall motion was normal; there were no regional wall motion abnormalities. Doppler parameters are consistent with abnormal left ventricular relaxation (grade 1 diastolic  dysfunction). - Aortic valve: Valve mobility was restricted. There was mild stenosis. Mild regurgitation. Valve area: 1.6cm^2(VTI). Valve area: 1.48cm^2 (Vmax).  CT Angio CHEST IMPRESSION FROM 01/2013: Unchanged thoracic aneurysm with maximal measurement 44 mm. Severe atherosclerosis with ulcerated plaque along the arch but no acute abnormalities.    Assessment/Plan: 1. 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   2. Hypercholesterolemia - will check labs at her next visit   3. Myasthenia gravis followed at [facility]  4. Past history of spinal stenosis with chronic low back pain. Implantation of spinal cord stimulator at [facility] on 01/27/14 - seeing provider at [facility] today.   5.  Frequent falls and poor balance  6. History of fusiform aneurysm of ascending aorta 4 cm - last measured in 2014.   Continue conservative management.  She has been on low dose metoprol in the past but had severe bradycardia and weakness. It has not been restarted since that time.  Current medicines are reviewed with the patient today.  The patient does not have concerns regarding medicines other than what has been noted above.  The following changes have been made:  See above.  Labs/ tests ordered today include:    No orders of the defined types were placed in this encounter.    Mertie Moores, MD  02/24/2017 2:25 PM    [facility] [address],  [place] [place], [place]  [address] Pager [phone] Phone: [phone]; Fax: [phone]

## 2017-02-24 NOTE — Patient Instructions (Signed)

## 2017-02-25 DIAGNOSIS — M48062 Spinal stenosis, lumbar region with neurogenic claudication: Secondary | ICD-10-CM | POA: Diagnosis not present

## 2017-03-19 ENCOUNTER — Other Ambulatory Visit (INDEPENDENT_AMBULATORY_CARE_PROVIDER_SITE_OTHER): Payer: Medicare Other

## 2017-03-19 ENCOUNTER — Ambulatory Visit (INDEPENDENT_AMBULATORY_CARE_PROVIDER_SITE_OTHER): Payer: Medicare Other | Admitting: Internal Medicine

## 2017-03-19 VITALS — BP 130/82 | HR 96 | Temp 97.1°F | Resp 16 | Ht 63.0 in | Wt 161.0 lb

## 2017-03-19 DIAGNOSIS — R5381 Other malaise: Secondary | ICD-10-CM | POA: Diagnosis not present

## 2017-03-19 DIAGNOSIS — R739 Hyperglycemia, unspecified: Secondary | ICD-10-CM

## 2017-03-19 DIAGNOSIS — K219 Gastro-esophageal reflux disease without esophagitis: Secondary | ICD-10-CM | POA: Diagnosis not present

## 2017-03-19 DIAGNOSIS — R61 Generalized hyperhidrosis: Secondary | ICD-10-CM | POA: Diagnosis not present

## 2017-03-19 DIAGNOSIS — F3289 Other specified depressive episodes: Secondary | ICD-10-CM | POA: Diagnosis not present

## 2017-03-19 DIAGNOSIS — I10 Essential (primary) hypertension: Secondary | ICD-10-CM | POA: Diagnosis not present

## 2017-03-19 DIAGNOSIS — R5383 Other fatigue: Secondary | ICD-10-CM | POA: Diagnosis not present

## 2017-03-19 LAB — TSH: TSH: 1.71 u[IU]/mL (ref 0.35–4.50)

## 2017-03-19 LAB — CBC WITH DIFFERENTIAL/PLATELET
BASOS ABS: 0.1 10*3/uL (ref 0.0–0.1)
Basophils Relative: 0.7 % (ref 0.0–3.0)
EOS ABS: 0.1 10*3/uL (ref 0.0–0.7)
Eosinophils Relative: 1.6 % (ref 0.0–5.0)
HEMATOCRIT: 33.9 % — AB (ref 36.0–46.0)
Hemoglobin: 11.2 g/dL — ABNORMAL LOW (ref 12.0–15.0)
LYMPHS ABS: 0.5 10*3/uL — AB (ref 0.7–4.0)
LYMPHS PCT: 6.7 % — AB (ref 12.0–46.0)
MCHC: 33 g/dL (ref 30.0–36.0)
MCV: 99 fl (ref 78.0–100.0)
MONOS PCT: 4.3 % (ref 3.0–12.0)
Monocytes Absolute: 0.3 10*3/uL (ref 0.1–1.0)
NEUTROS ABS: 7 10*3/uL (ref 1.4–7.7)
NEUTROS PCT: 86.7 % — AB (ref 43.0–77.0)
PLATELETS: 232 10*3/uL (ref 150.0–400.0)
RBC: 3.43 Mil/uL — AB (ref 3.87–5.11)
RDW: 15 % (ref 11.5–15.5)
WBC: 8.1 10*3/uL (ref 4.0–10.5)

## 2017-03-19 LAB — COMPREHENSIVE METABOLIC PANEL
ALK PHOS: 54 U/L (ref 39–117)
ALT: 18 U/L (ref 0–35)
AST: 17 U/L (ref 0–37)
Albumin: 4 g/dL (ref 3.5–5.2)
BILIRUBIN TOTAL: 0.5 mg/dL (ref 0.2–1.2)
BUN: 23 mg/dL (ref 6–23)
CO2: 25 meq/L (ref 19–32)
CREATININE: 0.89 mg/dL (ref 0.40–1.20)
Calcium: 9.3 mg/dL (ref 8.4–10.5)
Chloride: 104 mEq/L (ref 96–112)
GFR: 63.31 mL/min (ref >60.00)
GLUCOSE: 290 mg/dL — AB (ref 70–99)
Potassium: 3.4 mEq/L — ABNORMAL LOW (ref 3.5–5.1)
Sodium: 140 mEq/L (ref 135–145)
TOTAL PROTEIN: 6.6 g/dL (ref 6.0–8.3)

## 2017-03-19 LAB — HEMOGLOBIN A1C: Hgb A1c MFr Bld: 6.1 % (ref 4.6–6.5)

## 2017-03-19 MED ORDER — TRAZODONE HCL 50 MG PO TABS: 50.0000 mg | ORAL_TABLET | Freq: Every evening | ORAL | 1 refills | Status: DC | PRN

## 2017-03-19 NOTE — Progress Notes (Signed)
Pre visit review using our clinic review tool, if applicable. No additional management support is needed unless otherwise documented below in the visit note. 

## 2017-03-19 NOTE — Progress Notes (Signed)
Subjective:    Patient ID: Tamara Gregory, female    DOB: 1925/10/12, 83 y.o.   MRN: [id number]  HPI She is here for an acute visit.   She feels horrible.  Her back pain is severe and it is causing significant depression.   Chronic back pain:  She has chronic severe back pain.  She has recently seen a back specialist and will see pain management.  She may try injections and may be a candidate for surgery.  Depression:  She is back on the cymbalta to see if it was the cause of her sweating.  She was off the medication for two weeks. She feels very depressed.  At her last visit she felt her depression was controlled on the cymbalta.  She restarted the cymbalta a couple of days ago.      Sweating:  She has been severe sweating daily with any activity.  She gets wet, especially in the back of her neck.  She has had this for months.  She stopped the cymbalta for two weeks and the sweating persisted. It bothers her a lot and she wants to find out the reason.  She is very sedentary and any activity causes sweating.   When she eats she is only able to eat a little then she has to stop because she feels full.  Sometimes her epigastric region will hurt.  She thinks it is typically occurs with difficult foods to digest.  She denies GERD  Medications and allergies reviewed with patient and updated if appropriate.  Patient Active Problem List   Diagnosis Date Noted  . Squamous cell skin cancer 11/05/2016  . Nosebleed 10/19/2016  . Osteopenia 08/28/2016  . Essential hypertension, benign 08/21/2016  . Long term current use of systemic steroids 08/21/2016  . GERD (gastroesophageal reflux disease) 05/06/2016  . Mixed incontinence urge and stress 05/06/2016  . Low hemoglobin 09/22/2014  . Insomnia 07/26/2014  . Malaise and fatigue 10/04/2013  . PAC (premature atrial contraction) 10/04/2013  . Depression (emotion) 02/15/2013  . Spinal stenosis, lumbar region, with neurogenic claudication 02/04/2013   . Myasthenia gravis ([place]) 03/17/2012  . Pure hypercholesterolemia 09/05/2011  . Benign hypertensive heart disease without heart failure 09/05/2011  . Osteoarthritis 09/05/2011    Current Outpatient Prescriptions on File Prior to Visit  Medication Sig Dispense Refill  . amLODipine (NORVASC) 5 MG tablet Take 1/2 tablet by mouth daily    . Calcium Carbonate (CALCIUM 500 PO) Take 1 capsule by mouth daily.     . cholecalciferol (VITAMIN D) 1000 UNITS tablet Take 2,000 Units by mouth daily.     . DULoxetine (CYMBALTA) 20 MG capsule Take 1 capsule (20 mg total) by mouth daily. 90 capsule 3  . folic acid (FOLVITE) 431 MCG tablet Take 400 mcg by mouth daily.    . Glucosamine-Chondroit-Vit C-Mn (GLUCOSAMINE 1500 COMPLEX PO) Take 1 tablet by mouth daily.     [name] latanoprost (XALATAN) 0.005 % ophthalmic solution Place 1 drop into the left eye at bedtime.  1  . losartan-hydrochlorothiazide (HYZAAR) 100-25 MG tablet TAKE 1 TABLET EVERY DAY BEFORE BREAKFAST 90 tablet 2  . mirabegron ER (MYRBETRIQ) 25 MG TB24 tablet Take 1 tablet (25 mg total) by mouth daily. 90 tablet 1  . Multiple Vitamins-Minerals (PRESERVISION AREDS) CAPS Take 2 capsules by mouth daily.    . Omega-3 Fatty Acids (FISH OIL) 435 MG CAPS Take 1 capsule by mouth daily.    . predniSONE (DELTASONE) 5 MG tablet Take  1 tablet (5 mg total) by mouth daily with breakfast. 90 tablet 3  . ranitidine (ZANTAC) 150 MG tablet Take 1 tablet (150 mg total) by mouth at bedtime. 90 tablet 1  . rosuvastatin (CRESTOR) 5 MG tablet Take 1 tablet (5 mg total) by mouth daily. 90 tablet 2  . traZODone (DESYREL) 50 MG tablet Take 0.5-1 tablets (25-50 mg total) by mouth at bedtime as needed for sleep. 90 tablet 1   No current facility-administered medications on file prior to visit.     Past Medical History:  Diagnosis Date  . Arthritis   . Cancer (HCC)    hx of skin cancer   . Depression   . GERD (gastroesophageal reflux disease)   . Headache(784.0)   .  History of migraines   . Hyperlipidemia   . Hypertension   . Macular degeneration   . Myasthenia gravis ([place])   . Spinal stenosis   . Thoracic aneurysm    4.4cm; see CT done 01/28/12 and 01/22/13 in [facility].   [name] Uses hearing aid   . Vitamin D deficiency     Past Surgical History:  Procedure Laterality Date  . ABDOMINAL HYSTERECTOMY    . CHOLECYSTECTOMY    . LUMBAR LAMINECTOMY/DECOMPRESSION MICRODISCECTOMY N/A 02/04/2013   Procedure: CENTRAL DECOMPRESSION/LUMBAR LAMINECTOMY L3-L4 AND L4-L5 2 LEVELS;  Surgeon: Tobi Bastos, MD;  Location: [facility];  Service: Orthopedics;  Laterality: N/A;  . TONSILLECTOMY    . US ECHOCARDIOGRAPHY  05/18/2007   EF 55-60%    Social History   Social History  . Marital status: Married    Spouse name: N/A  . Number of children: N/A  . Years of education: N/A   Social History Main Topics  . Smoking status: Never Smoker  . Smokeless tobacco: Never Used  . Alcohol use No  . Drug use: No  . Sexual activity: Not on file   Other Topics Concern  . Not on file   Social History Narrative   Lives with husband in a one story home.  Has 2 sons.  Retired.  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  Education: college.  [employer].    Family History  Problem Relation Age of Onset  . Heart attack Father   . Cancer Sister   . Cancer Brother   . Cancer Sister   . Cancer Sister     Review of Systems  Constitutional: Positive for appetite change (decreased) and diaphoresis. Negative for chills and fever.  HENT: Negative for trouble swallowing.   Respiratory: Positive for shortness of breath. Negative for cough and wheezing.   Cardiovascular: Negative for chest pain, palpitations and leg swelling.  Gastrointestinal: Positive for abdominal pain (epigastric pain with eating intermittent). Negative for blood in stool, constipation and diarrhea.       No GERD  Musculoskeletal: Positive for back pain.  Neurological: Positive for  headaches.       Objective:   Vitals:   03/19/17 1336  BP: 130/82  Pulse: 96  Resp: 16  Temp: 97.1 F (36.2 C)   Filed Weights   03/19/17 1336  Weight: 161 lb (73 kg)   Body mass index is 28.52 kg/m.  Wt Readings from Last 3 Encounters:  03/19/17 161 lb (73 kg)  02/24/17 161 lb 6.4 oz (73.2 kg)  02/11/17 157 lb (71.2 kg)     Physical Exam Constitutional: Appears well-developed and well-nourished. No distress.  HENT:  Head: Normocephalic and atraumatic.  Neck: Neck  supple. No tracheal deviation present. No thyromegaly present.  No cervical lymphadenopathy Cardiovascular: Normal rate, regular rhythm and normal heart sounds.   2/6 systolic murmur heard. No carotid bruit .  mild edema Pulmonary/Chest: Effort normal and breath sounds normal. No respiratory distress. No has no wheezes. No rales.  Skin: Skin is warm and dry. Not diaphoretic.  Psychiatric: Normal mood and affect. Behavior is normal.         Assessment & Plan:   See Problem List for Assessment and Plan of chronic medical problems.

## 2017-03-19 NOTE — Patient Instructions (Signed)
  Test(s) ordered today. Your results will be released to [place] (or called to you) after review, usually within 72hours after test completion. If any changes need to be made, you will be notified at that same time.   Medications reviewed and updated.  No changes recommended at this time.    A referral was ordered for Endocrinology  Please followup in 3 months

## 2017-03-20 ENCOUNTER — Encounter: Payer: Self-pay | Admitting: Internal Medicine

## 2017-03-20 NOTE — Assessment & Plan Note (Signed)
Check a1c 

## 2017-03-20 NOTE — Assessment & Plan Note (Signed)
Controlled Continue daily medication 

## 2017-03-20 NOTE — Assessment & Plan Note (Signed)
BP well controlled Current regimen effective and well tolerated Continue current medications at current doses  

## 2017-03-20 NOTE — Assessment & Plan Note (Signed)
Likely multifactorial Check labs Her chronic pain is contributing - being evaluated - she may have injections/surgery if needed Depression may be contributing - continue cymbalta

## 2017-03-20 NOTE — Assessment & Plan Note (Addendum)
?   Cause Did not improve with stopping cymbalta for two weeks Check labs I am unsure what the cause is - it is very bothersome for her She is very sedentary but the sweating does seem excessive Will refer to endocrine for help determining cause No concerning symptoms accompany sweating such as chest pain, palpitations or SOB

## 2017-03-20 NOTE — Assessment & Plan Note (Signed)
Not controlled Stopped cymbalta to see if her sweating got better - it did not Restarted cymbalta a couple of days ago -- discussed that it will take several more days for medication to start working Her depression was well controlled previously and will just continue current dose and titrate dose if needed

## 2017-03-25 ENCOUNTER — Ambulatory Visit: Payer: Medicare Other | Admitting: Internal Medicine

## 2017-03-26 ENCOUNTER — Telehealth: Payer: Self-pay | Admitting: Internal Medicine

## 2017-03-26 DIAGNOSIS — K921 Melena: Secondary | ICD-10-CM

## 2017-03-26 DIAGNOSIS — D649 Anemia, unspecified: Secondary | ICD-10-CM

## 2017-03-26 NOTE — Telephone Encounter (Signed)
Spoke with pt's husband to inform.  

## 2017-03-26 NOTE — Telephone Encounter (Signed)
She needs to see GI - may have a GI bleed.  Referral ordered

## 2017-03-26 NOTE — Telephone Encounter (Signed)
Please advise, pts husband mentioned this to me when I called to give lab results but said he would notify use if it continued. Please advise.

## 2017-03-26 NOTE — Telephone Encounter (Signed)
Patients husband came into office. He wanted to make sure Dr. Quay Burow was aware that his wifes stool is black. He states not a normal black VERY black. He is worried about her. Please follow up with patient. Or advise if patient needs an appointment. Thank you.

## 2017-03-27 ENCOUNTER — Other Ambulatory Visit (INDEPENDENT_AMBULATORY_CARE_PROVIDER_SITE_OTHER): Payer: Medicare Other

## 2017-03-27 ENCOUNTER — Encounter: Payer: Self-pay | Admitting: Nurse Practitioner

## 2017-03-27 ENCOUNTER — Ambulatory Visit (INDEPENDENT_AMBULATORY_CARE_PROVIDER_SITE_OTHER): Payer: Medicare Other | Admitting: Nurse Practitioner

## 2017-03-27 VITALS — BP 104/60 | HR 80 | Ht 65.0 in | Wt 160.0 lb

## 2017-03-27 DIAGNOSIS — R030 Elevated blood-pressure reading, without diagnosis of hypertension: Secondary | ICD-10-CM | POA: Diagnosis not present

## 2017-03-27 DIAGNOSIS — M48062 Spinal stenosis, lumbar region with neurogenic claudication: Secondary | ICD-10-CM | POA: Diagnosis not present

## 2017-03-27 DIAGNOSIS — K922 Gastrointestinal hemorrhage, unspecified: Secondary | ICD-10-CM | POA: Diagnosis not present

## 2017-03-27 DIAGNOSIS — Z6826 Body mass index (BMI) 26.0-26.9, adult: Secondary | ICD-10-CM | POA: Diagnosis not present

## 2017-03-27 DIAGNOSIS — Z9689 Presence of other specified functional implants: Secondary | ICD-10-CM | POA: Diagnosis not present

## 2017-03-27 LAB — BASIC METABOLIC PANEL
BUN: 19 mg/dL (ref 6–23)
CALCIUM: 10.3 mg/dL (ref 8.4–10.5)
CO2: 28 mEq/L (ref 19–32)
CREATININE: 0.95 mg/dL (ref 0.40–1.20)
Chloride: 102 mEq/L (ref 96–112)
GFR: 58.71 mL/min — AB (ref >60.00)
Glucose, Bld: 236 mg/dL — ABNORMAL HIGH (ref 70–99)
POTASSIUM: 5 meq/L (ref 3.5–5.1)
Sodium: 141 mEq/L (ref 135–145)

## 2017-03-27 LAB — CBC
HCT: 33.2 % — ABNORMAL LOW (ref 36.0–46.0)
Hemoglobin: 11 g/dL — ABNORMAL LOW (ref 12.0–15.0)
MCHC: 33.2 g/dL (ref 30.0–36.0)
MCV: 98.3 fl (ref 78.0–100.0)
Platelets: 314 10*3/uL (ref 150.0–400.0)
RBC: 3.38 Mil/uL — ABNORMAL LOW (ref 3.87–5.11)
RDW: 15.2 % (ref 11.5–15.5)
WBC: 8.8 10*3/uL (ref 4.0–10.5)

## 2017-03-27 NOTE — Patient Instructions (Signed)
If you are age 80 or older, your body mass index should be between 23-30. Your Body mass index is 26.63 kg/m. If this is out of the aforementioned range listed, please consider follow up with your Primary Care Provider.  If you are age 70 or younger, your body mass index should be between 19-25. Your Body mass index is 26.63 kg/m. If this is out of the aformentioned range listed, please consider follow up with your Primary Care Provider.   You have been scheduled for an endoscopy. Please follow written instructions given to you at your visit today. If you use inhalers (even only as needed), please bring them with you on the day of your procedure. Your physician has requested that you go to www.startemmi.com and enter the access code given to you at your visit today. This web site gives a general overview about your procedure. However, you should still follow specific instructions given to you by our office regarding your preparation for the procedure.   Your physician has requested that you go to the basement for the following lab work before leaving today: BMET CBC  STOP Excedrin.  Take Prilosec 40 mg twice daily for now.  Thank you for choosing me and [facility].  Tye Savoy, NP

## 2017-03-27 NOTE — Progress Notes (Signed)
     HPI: Patient is a 83 year old female known to Dr. Ardis Hughs. She was seen August 2017 for evaluation of GERD and dyspepsia. At that time she complained of postprandial upper abdominal pain present for 6 months to a year. Dyspepsia was felt to be secondary to daily aspirin, symptoms improved after increasing Prilosec.  Patient is here with her husband who helps with the history. Patient has chronic back pain. Has Neurosurgery appt this am. Patient stopped PPI due to concerns about long-term side effects. She has been on Zantac and doing fairly well. Still has prandial epigastric pain sometimes but overall controlled. She comes in today with a 6 day history of black stools. Patient has been taking Excedrin for pain. She does not know whether the Excedrin she is taking contains aspirin or not. She has no chest pain. Her fatigue and shortness of breath are chronic.    Past Medical History:  Diagnosis Date  . Arthritis   . Cancer (HCC)    hx of skin cancer   . Depression   . GERD (gastroesophageal reflux disease)   . Headache(784.0)   . History of migraines   . Hyperlipidemia   . Hypertension   . Macular degeneration   . Myasthenia gravis ([place])   . Spinal stenosis   . Thoracic aneurysm    4.4cm; see CT done 01/28/12 and 01/22/13 in [facility].   [name] Uses hearing aid   . Vitamin D deficiency     Patient's surgical history, family medical history, social history, medications and allergies were all reviewed in Epic    Physical Exam: BP 104/60   Pulse 80   Ht 5\' 5"  (1.651 m)   Wt 160 lb (72.6 kg)   BMI 26.63 kg/m   GENERAL: well developed white female in NAD PSYCH: :Pleasant, cooperative, normal affect EENT:  conjunctiva pink, mucous membranes moist, neck supple without masses CARDIAC:  RRR,  No peripheral edema PULM: Normal respiratory effort, lungs CTA bilaterally, no wheezing ABDOMEN:  soft, nontender, nondistended, no obvious masses, no hepatomegaly,  normal bowel sounds SKIN:  turgor,  no lesions seen Musculoskeletal:  Normal muscle tone, normal strength NEURO: Alert and oriented x 3, no focal neurologic deficits   ASSESSMENT and PLAN:  Pleasant 83 year old female with chronic back pain taking Excedrin (possibly containing asa). Here with black stools x 6 days. Stools brown on exam but grossly heme positive.  -CBC, BMET now.  -Hold Excedrin for now -Prilosec 40 mg BID  -EGD tomorrow at [facility] with Dr. Ardis Hughs. The risks and benefits of EGD were discussed and the patient agrees to proceed.    Tye Savoy , NP 03/27/2017, 9:51 AM

## 2017-03-27 NOTE — Progress Notes (Signed)
I agree with the above note, plan 

## 2017-03-28 ENCOUNTER — Ambulatory Visit (AMBULATORY_SURGERY_CENTER): Payer: Medicare Other | Admitting: Gastroenterology

## 2017-03-28 ENCOUNTER — Other Ambulatory Visit: Payer: Self-pay | Admitting: Internal Medicine

## 2017-03-28 ENCOUNTER — Encounter: Payer: Self-pay | Admitting: Gastroenterology

## 2017-03-28 VITALS — BP 146/87 | HR 75 | Temp 96.2°F | Resp 17 | Ht 65.0 in | Wt 160.0 lb

## 2017-03-28 DIAGNOSIS — K922 Gastrointestinal hemorrhage, unspecified: Secondary | ICD-10-CM | POA: Diagnosis not present

## 2017-03-28 MED ORDER — SODIUM CHLORIDE 0.9 % IV SOLN: 500.0000 mL | INTRAVENOUS | Status: DC

## 2017-03-28 NOTE — Progress Notes (Signed)
To Pacu VSS Report to Rn

## 2017-03-28 NOTE — Op Note (Signed)
[facility] [place] Patient Name: Tamara Gregory Procedure Date: 03/28/2017 3:42 PM MRN: [id number] Endoscopist: Milus Banister , MD Age: 83 Referring MD:  Date of Birth: [birth year] Gender: Female Account #: [id number] Procedure:                Upper GI endoscopy Indications:              Heme positive stool, Melena; Hb yesterday was 11;                            was taking daily excedrine and stopped PPI recently Medicines:                Monitored Anesthesia Care Procedure:                Pre-Anesthesia Assessment:                           - Prior to the procedure, a History and Physical                            was performed, and patient medications and                            allergies were reviewed. The patient's tolerance of                            previous anesthesia was also reviewed. The risks                            and benefits of the procedure and the sedation                            options and risks were discussed with the patient.                            All questions were answered, and informed consent                            was obtained. Prior Anticoagulants: The patient has                            taken no previous anticoagulant or antiplatelet                            agents. ASA Grade Assessment: III - A patient with                            severe systemic disease. After reviewing the risks                            and benefits, the patient was deemed in                            satisfactory condition to undergo the procedure.  After obtaining informed consent, the endoscope was                            passed under direct vision. Throughout the                            procedure, the patient's blood pressure, pulse, and                            oxygen saturations were monitored continuously. The                            Endoscope was introduced through the mouth, and   advanced to the second part of duodenum. The upper                            GI endoscopy was accomplished without difficulty.                            The patient tolerated the procedure well. Scope In: Scope Out: Findings:                 The esophagus was normal.                           A large hiatal hernia was present with at least one                            linear Cameron's erosion that was friable to scope                            touch. No overt bleeding in UGI..                           The examined duodenum was normal. Complications:            No immediate complications. Estimated blood loss:                            None. Estimated Blood Loss:     Estimated blood loss: none. Impression:               - Large hiatal hernia with at least one Cameron's                            erosion. The proximal gastric anatomy was so                            distorted from the hiatal hernia that I was unable                            to visualize about 1/3 of the mucosa in the                            proximal stomach. Recommendation:           -  Patient has a contact number available for                            emergencies. The signs and symptoms of potential                            delayed complications were discussed with the                            patient. Return to normal activities tomorrow.                            Written discharge instructions were provided to the                            patient.                           - Resume previous diet.                           - Continue present medications. You should NOT                            resume excedrine or other NSAID containing                            medicines. You can take tylenol for pains (up to 4                            grams per day). You need to stay on omeprazole                            twice daily.                           - Please call Dr. Ardis Hughs' office in 1-2 weeks  to                            report on your progress.                           - No repeat upper endoscopy. Milus Banister, MD 03/28/2017 4:04:53 PM This report has been signed electronically.

## 2017-03-28 NOTE — Patient Instructions (Signed)
YOU HAD AN ENDOSCOPIC PROCEDURE TODAY AT [facility]:   Refer to the procedure report that was given to you for any specific questions about what was found during the examination.  If the procedure report does not answer your questions, please call your gastroenterologist to clarify.  If you requested that your care partner not be given the details of your procedure findings, then the procedure report has been included in a sealed envelope for you to review at your convenience later.  YOU SHOULD EXPECT: Some feelings of bloating in the abdomen. Passage of more gas than usual.  Walking can help get rid of the air that was put into your GI tract during the procedure and reduce the bloating. If you had a lower endoscopy (such as a colonoscopy or flexible sigmoidoscopy) you may notice spotting of blood in your stool or on the toilet paper. If you underwent a bowel prep for your procedure, you may not have a normal bowel movement for a few days.  Please Note:  You might notice some irritation and congestion in your nose or some drainage.  This is from the oxygen used during your procedure.  There is no need for concern and it should clear up in a day or so.  SYMPTOMS TO REPORT IMMEDIATELY:   Following upper endoscopy (EGD)  Vomiting of blood or coffee ground material  New chest pain or pain under the shoulder blades  Painful or persistently difficult swallowing  New shortness of breath  Fever of 100F or higher  Black, tarry-looking stools  For urgent or emergent issues, a gastroenterologist can be reached at any hour by calling [phone].  Medications: Stay on omeprazole twice a day and STOP excedrin and or any other NSAIDS. You may take tylenol for pain up to 4 Grams per day.   DIET:  We do recommend a small meal at first, but then you may proceed to your regular diet.  Drink plenty of fluids but you should avoid alcoholic beverages for 24 hours.  ACTIVITY:  You should plan  to take it easy for the rest of today and you should NOT DRIVE or use heavy machinery until tomorrow (because of the sedation medicines used during the test).    FOLLOW UP: Our staff will call the number listed on your records the next business day following your procedure to check on you and address any questions or concerns that you may have regarding the information given to you following your procedure. If we do not reach you, we will leave a message.  However, if you are feeling well and you are not experiencing any problems, there is no need to return our call.  We will assume that you have returned to your regular daily activities without incident.  If any biopsies were taken you will be contacted by phone or by letter within the next 1-3 weeks.  Please call us at [phone] if you have not heard about the biopsies in 3 weeks.    SIGNATURES/CONFIDENTIALITY: You and/or your care partner have signed paperwork which will be entered into your electronic medical record.  These signatures attest to the fact that that the information above on your After Visit Summary has been reviewed and is understood.  Full responsibility of the confidentiality of this discharge information lies with you and/or your care-partner.  Thank you for letting us take care of your healthcare needs today.

## 2017-03-28 NOTE — Progress Notes (Signed)
No egg or soy allergy 

## 2017-03-31 ENCOUNTER — Telehealth: Payer: Self-pay | Admitting: *Deleted

## 2017-03-31 NOTE — Telephone Encounter (Signed)
  Follow up Call-  Call back number 03/28/2017  Post procedure Call Back phone  # [phone]  Permission to leave phone message Yes  Some recent data might be hidden     Patient questions:  Do you have a fever, pain , or abdominal swelling? No. Pain Score  0 *  Have you tolerated food without any problems? Yes.    Have you been able to return to your normal activities? Yes.    Do you have any questions about your discharge instructions: Diet   No. Medications  No. Follow up visit  No.  Do you have questions or concerns about your Care? No.  Actions: * If pain score is 4 or above: No action needed, pain <4.

## 2017-04-01 ENCOUNTER — Encounter: Payer: Self-pay | Admitting: Internal Medicine

## 2017-04-01 DIAGNOSIS — K449 Diaphragmatic hernia without obstruction or gangrene: Secondary | ICD-10-CM | POA: Insufficient documentation

## 2017-04-16 DIAGNOSIS — M9903 Segmental and somatic dysfunction of lumbar region: Secondary | ICD-10-CM | POA: Diagnosis not present

## 2017-04-16 DIAGNOSIS — M545 Low back pain: Secondary | ICD-10-CM | POA: Diagnosis not present

## 2017-04-16 DIAGNOSIS — M62830 Muscle spasm of back: Secondary | ICD-10-CM | POA: Diagnosis not present

## 2017-04-17 DIAGNOSIS — M545 Low back pain: Secondary | ICD-10-CM | POA: Diagnosis not present

## 2017-04-17 DIAGNOSIS — M62830 Muscle spasm of back: Secondary | ICD-10-CM | POA: Diagnosis not present

## 2017-04-17 DIAGNOSIS — M9903 Segmental and somatic dysfunction of lumbar region: Secondary | ICD-10-CM | POA: Diagnosis not present

## 2017-04-21 ENCOUNTER — Encounter: Payer: Self-pay | Admitting: Endocrinology

## 2017-04-21 ENCOUNTER — Ambulatory Visit (INDEPENDENT_AMBULATORY_CARE_PROVIDER_SITE_OTHER): Payer: Medicare Other | Admitting: Endocrinology

## 2017-04-21 VITALS — BP 116/68 | HR 87 | Ht 65.0 in | Wt 160.0 lb

## 2017-04-21 DIAGNOSIS — M9903 Segmental and somatic dysfunction of lumbar region: Secondary | ICD-10-CM | POA: Diagnosis not present

## 2017-04-21 DIAGNOSIS — R61 Generalized hyperhidrosis: Secondary | ICD-10-CM

## 2017-04-21 DIAGNOSIS — M62830 Muscle spasm of back: Secondary | ICD-10-CM | POA: Diagnosis not present

## 2017-04-21 DIAGNOSIS — M545 Low back pain: Secondary | ICD-10-CM | POA: Diagnosis not present

## 2017-04-21 NOTE — Progress Notes (Signed)
Subjective:    Patient ID: Tamara Gregory, female    DOB: 1925-07-17, 83 y.o.   MRN: [id number]  HPI Pt is referred by Dr Quay Burow, for excessive diaphoresis.  she reports 1-2 years of moderate palpitations in the chest, and assoc diaphoresis.  She says this happens a few times per week, and when she is getting ready to go somewhere.  It lasts a few hrs, then resolves without rx.  she has h/o HTN, but it is well-controlled.  she has no h/o adrenal disease, diabetes, MTC, alcohol withdrawal, neurofibromas, hemangiomas, brain aneurysm, cocaine use, or thyroid disease.   Past Medical History:  Diagnosis Date  . Arthritis   . Cancer (HCC)    hx of skin cancer   . Depression   . GERD (gastroesophageal reflux disease)   . Headache(784.0)   . History of migraines   . Hyperlipidemia   . Hypertension   . Macular degeneration   . Myasthenia gravis ([place])   . Spinal stenosis   . Thoracic aneurysm    4.4cm; see CT done 01/28/12 and 01/22/13 in EPIC.   [name] Uses hearing aid   . Vitamin D deficiency     Past Surgical History:  Procedure Laterality Date  . ABDOMINAL HYSTERECTOMY    . CHOLECYSTECTOMY    . LUMBAR LAMINECTOMY/DECOMPRESSION MICRODISCECTOMY N/A 02/04/2013   Procedure: CENTRAL DECOMPRESSION/LUMBAR LAMINECTOMY L3-L4 AND L4-L5 2 LEVELS;  Surgeon: Tobi Bastos, MD;  Location: [facility];  Service: Orthopedics;  Laterality: N/A;  . SPINAL CORD STIMULATOR IMPLANT     in right hip but not currently using because it did not help  . TONSILLECTOMY    . US ECHOCARDIOGRAPHY  05/18/2007   EF 55-60%    Social History   Social History  . Marital status: Married    Spouse name: N/A  . Number of children: N/A  . Years of education: N/A   Occupational History  . Not on file.   Social History Main Topics  . Smoking status: Never Smoker  . Smokeless tobacco: Never Used  . Alcohol use No  . Drug use: No  . Sexual activity: Not on file   Other Topics Concern  . Not on file   Social History  Narrative   Lives with husband in a one story home.  Has 2 sons.  Retired.  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  Education: college.  [employer].    Current Outpatient Prescriptions on File Prior to Visit  Medication Sig Dispense Refill  . amLODipine (NORVASC) 5 MG tablet Take 1/2 tablet by mouth daily    . Calcium Carbonate (CALCIUM 500 PO) Take 1 capsule by mouth daily.     . cholecalciferol (VITAMIN D) 1000 UNITS tablet Take 2,000 Units by mouth daily.     . DULoxetine (CYMBALTA) 20 MG capsule Take 1 capsule (20 mg total) by mouth daily. 90 capsule 3  . folic acid (FOLVITE) 637 MCG tablet Take 400 mcg by mouth daily.    . Glucosamine-Chondroit-Vit C-Mn (GLUCOSAMINE 1500 COMPLEX PO) Take 1 tablet by mouth daily.     [name] latanoprost (XALATAN) 0.005 % ophthalmic solution Place 1 drop into the left eye at bedtime.  1  . losartan-hydrochlorothiazide (HYZAAR) 100-25 MG tablet TAKE 1 TABLET EVERY DAY BEFORE BREAKFAST 90 tablet 2  . mirabegron ER (MYRBETRIQ) 25 MG TB24 tablet Take 1 tablet (25 mg total) by mouth daily. 90 tablet 1  . Multiple Vitamins-Minerals (PRESERVISION AREDS) CAPS  Take 2 capsules by mouth daily.    . Omega-3 Fatty Acids (FISH OIL) 435 MG CAPS Take 1 capsule by mouth daily.    [name] omeprazole (PRILOSEC) 40 MG capsule TAKE 1 CAPSULE (40 MG TOTAL) BY MOUTH DAILY 30 MINUTES BEFORE A MEAL (Patient taking differently: TAKE 1 CAPSULE (40 MG TOTAL) BY MOUTH TWICE DAILY 30 MINUTES BEFORE A MEAL) 90 capsule 3  . predniSONE (DELTASONE) 5 MG tablet Take 1 tablet (5 mg total) by mouth daily with breakfast. 90 tablet 3  . rosuvastatin (CRESTOR) 5 MG tablet Take 1 tablet (5 mg total) by mouth daily. 90 tablet 2  . traZODone (DESYREL) 50 MG tablet Take 1 tablet (50 mg total) by mouth at bedtime as needed for sleep. 90 tablet 1  . omeprazole (PRILOSEC) 40 MG capsule     . ranitidine (ZANTAC) 150 MG tablet Take 1 tablet (150 mg total) by mouth at bedtime. (Patient not  taking: Reported on 04/21/2017) 90 tablet 1   Current Facility-Administered Medications on File Prior to Visit  Medication Dose Route Frequency Provider Last Rate Last Dose  . 0.9 %  sodium chloride infusion  500 mL Intravenous Continuous Milus Banister, MD        No Known Allergies  Family History  Problem Relation Age of Onset  . Heart attack Father   . Cancer Sister   . Cancer Brother   . Cancer Sister   . Cancer Sister   . Colon polyps Neg Hx   . Colon cancer Neg Hx   . Esophageal cancer Neg Hx   . Rectal cancer Neg Hx   . Stomach cancer Neg Hx   . Adrenal disorder Neg Hx     BP 116/68   Pulse 87   Ht 5\' 5"  (1.651 m)   Wt 160 lb (72.6 kg)   SpO2 94%   BMI 26.63 kg/m    Review of Systems denies flushing, pallor, n/v, syncope, diarrhea, weight loss, chest pain, sob, visual loss, edema, fever, arthralgias, and dry skin.  She has intermitt headache, easy bruising, rhinorrhea, and anxiety.      Objective:   Physical Exam VS: see vs page GEN: no distress HEAD: head: no deformity eyes: no periorbital swelling, no proptosis external nose and ears are normal mouth: no lesion seen NECK: supple, thyroid is not enlarged CHEST WALL: no deformity LUNGS: clear to auscultation CV: reg rate and rhythm, no murmur ABD: abdomen is soft, nontender.  no hepatosplenomegaly.  not distended.  no hernia.  MUSCULOSKELETAL: muscle bulk and strength are grossly normal.  no obvious joint swelling.  gait is steady with a walker.  EXTEMITIES: No leg edema.   PULSES: dorsalis pedis intact bilat.  no carotid bruit.  NEURO:  cn 2-12 grossly intact.   readily moves all 4's.  sensation is intact to touch on the feet.  SKIN:  Normal texture and temperature.  No rash or suspicious lesion is visible.  Slight diaphoresis at the back of the head.  Few ecchymoses at the forearms.    NODES:  None palpable at the neck PSYCH: alert, well-oriented.  Does not appear anxious nor depressed.  Lab Results   Component Value Date   TSH 1.71 03/19/2017   I have reviewed outside records, and summarized:  Pt was noted to have excessive diaphoresis, and referred here.  Main sxs addressed were back pain and depression, both described as severe.      Assessment & Plan:  Excessive diaphoresis, and other  sxs, uncertain etiology.  we'll w/u. Frail elderly state.  In this setting, I hesitate to rx sxs.    Patient Instructions  blood tests, and a 24-HR urine test, are requested for you today.  We'll let you know about the results.   I would be happy to see you back here as needed.

## 2017-04-21 NOTE — Patient Instructions (Addendum)
blood tests, and a 24-HR urine test, are requested for you today.  We'll let you know about the results.   I would be happy to see you back here as needed.

## 2017-04-23 DIAGNOSIS — M62830 Muscle spasm of back: Secondary | ICD-10-CM | POA: Diagnosis not present

## 2017-04-23 DIAGNOSIS — M545 Low back pain: Secondary | ICD-10-CM | POA: Diagnosis not present

## 2017-04-23 DIAGNOSIS — R61 Generalized hyperhidrosis: Secondary | ICD-10-CM | POA: Diagnosis not present

## 2017-04-23 DIAGNOSIS — M9903 Segmental and somatic dysfunction of lumbar region: Secondary | ICD-10-CM | POA: Diagnosis not present

## 2017-04-23 LAB — CALCITONIN: Calcitonin: <2 pg/mL (ref <=5)

## 2017-04-23 NOTE — Addendum Note (Signed)
Addended by: Kaylyn Lim I on: 04/23/2017 03:19 PM   Modules accepted: Orders

## 2017-04-24 DIAGNOSIS — M545 Low back pain: Secondary | ICD-10-CM | POA: Diagnosis not present

## 2017-04-24 DIAGNOSIS — M62830 Muscle spasm of back: Secondary | ICD-10-CM | POA: Diagnosis not present

## 2017-04-24 DIAGNOSIS — M9903 Segmental and somatic dysfunction of lumbar region: Secondary | ICD-10-CM | POA: Diagnosis not present

## 2017-04-29 LAB — METANEPHRINES, URINE, 24 HOUR
METANEPH TOTAL UR: 434 ug/(24.h) (ref 224–832)
Metanephrines, Ur: 70 mcg/24 h — ABNORMAL LOW (ref 90–315)
Normetanephrine, 24H Ur: 364 mcg/24 h (ref 122–676)

## 2017-05-01 LAB — CATECHOLAMINES, FRACTIONATED, URINE, 24 HOUR
Calculated Total (E+NE): 86 mcg/24 h (ref 26–121)
Creatinine, Urine mg/day-CATEUR: 0.67 g/(24.h) (ref 0.63–2.50)
Dopamine, 24 hr Urine: 177 mcg/24 h (ref 52–480)
NOREPINEPHRINE, 24 HR UR: 86 ug/(24.h) (ref 15–100)
Total Volume - CF 24Hr U: 1150 mL

## 2017-05-08 ENCOUNTER — Other Ambulatory Visit: Payer: Self-pay | Admitting: Internal Medicine

## 2017-05-12 ENCOUNTER — Other Ambulatory Visit: Payer: Self-pay | Admitting: Internal Medicine

## 2017-05-28 ENCOUNTER — Ambulatory Visit: Payer: Medicare Other | Admitting: Internal Medicine

## 2017-06-03 ENCOUNTER — Ambulatory Visit (INDEPENDENT_AMBULATORY_CARE_PROVIDER_SITE_OTHER): Payer: Medicare Other | Admitting: Internal Medicine

## 2017-06-03 ENCOUNTER — Encounter: Payer: Self-pay | Admitting: Internal Medicine

## 2017-06-03 VITALS — BP 114/76 | HR 95 | Temp 97.8°F | Resp 16 | Wt 159.0 lb

## 2017-06-03 DIAGNOSIS — N3946 Mixed incontinence: Secondary | ICD-10-CM

## 2017-06-03 DIAGNOSIS — I10 Essential (primary) hypertension: Secondary | ICD-10-CM | POA: Diagnosis not present

## 2017-06-03 DIAGNOSIS — M48062 Spinal stenosis, lumbar region with neurogenic claudication: Secondary | ICD-10-CM | POA: Diagnosis not present

## 2017-06-03 DIAGNOSIS — G4700 Insomnia, unspecified: Secondary | ICD-10-CM | POA: Diagnosis not present

## 2017-06-03 DIAGNOSIS — F3289 Other specified depressive episodes: Secondary | ICD-10-CM

## 2017-06-03 DIAGNOSIS — K219 Gastro-esophageal reflux disease without esophagitis: Secondary | ICD-10-CM

## 2017-06-03 MED ORDER — DULOXETINE HCL 20 MG PO CPEP: 40.0000 mg | ORAL_CAPSULE | Freq: Every day | ORAL | 1 refills | Status: DC

## 2017-06-03 MED ORDER — RANITIDINE HCL 150 MG PO TABS: 150.0000 mg | ORAL_TABLET | Freq: Two times a day (BID) | ORAL | Status: DC

## 2017-06-03 NOTE — Assessment & Plan Note (Signed)
Continue myrbetriq at current age - discussed that we can try a higher dose if she wants Continue 25 mg daily for now

## 2017-06-03 NOTE — Assessment & Plan Note (Signed)
Not controlled Increase cymbalta to 40 mg daily

## 2017-06-03 NOTE — Assessment & Plan Note (Signed)
Severe pain Has a spinal stimulator Follows with Dr Lovenia Shuck - advised they discuss with him pain management - GI does not want her taking nsaids  Continue tylenol daily

## 2017-06-03 NOTE — Progress Notes (Signed)
Subjective:    Patient ID: Tamara Gregory, female    DOB: 06/18/1925, 83 y.o.   MRN: [id number]  HPI She is here for an acute visit.   Back pain:  She has chronic back pain.  She has see pain management, but not for a while - she has had injections in the past.  She saw neurosurgery. Her legs are weak but she has no pain in the legs.    Profuse sweating:  She did see Dr Loanne Drilling and work up was negative.  He did not want to prescribe anything given her age.  It is still very bothersome.   Depression: She is taking her medication daily as prescribed. She denies any side effects from the medication. She feels her depression is not well controlled.   Overactive bladder, incontinence:  She is taking myrbetriq daily.  She still can not control her bladder and wears protection.   GERD:  She is taking her medication daily as prescribed.  She denies any GERD symptoms and feels her GERD is well controlled.  Her and her husband wonder if they can go back to the zantac.    Her head hurts, her sinuses are bothering her and she has a runny nose.  These symptoms started 1-2 days ago.  Her husband gave her a claritin today.    Medications and allergies reviewed with patient and updated if appropriate.  Patient Active Problem List   Diagnosis Date Noted  . Large hiatal hernia 04/01/2017  . Sweating profusely 03/19/2017  . Hyperglycemia 03/19/2017  . Squamous cell skin cancer 11/05/2016  . Nosebleed 10/19/2016  . Osteopenia 08/28/2016  . Essential hypertension, benign 08/21/2016  . Long term current use of systemic steroids 08/21/2016  . GERD (gastroesophageal reflux disease) 05/06/2016  . Mixed incontinence urge and stress 05/06/2016  . Low hemoglobin 09/22/2014  . Insomnia 07/26/2014  . Malaise and fatigue 10/04/2013  . PAC (premature atrial contraction) 10/04/2013  . Depression (emotion) 02/15/2013  . Spinal stenosis, lumbar region, with neurogenic claudication 02/04/2013  . Myasthenia  gravis ([place]) 03/17/2012  . Pure hypercholesterolemia 09/05/2011  . Benign hypertensive heart disease without heart failure 09/05/2011  . Osteoarthritis 09/05/2011    Current Outpatient Prescriptions on File Prior to Visit  Medication Sig Dispense Refill  . amLODipine (NORVASC) 5 MG tablet Take 1/2 tablet by mouth daily    . Calcium Carbonate (CALCIUM 500 PO) Take 1 capsule by mouth daily.     . cholecalciferol (VITAMIN D) 1000 UNITS tablet Take 2,000 Units by mouth daily.     . DULoxetine (CYMBALTA) 20 MG capsule TAKE 1 CAPSULE  DAILY 90 capsule 1  . folic acid (FOLVITE) 562 MCG tablet Take 400 mcg by mouth daily.    . Glucosamine-Chondroit-Vit C-Mn (GLUCOSAMINE 1500 COMPLEX PO) Take 1 tablet by mouth daily.     [name] latanoprost (XALATAN) 0.005 % ophthalmic solution Place 1 drop into the left eye at bedtime.  1  . losartan-hydrochlorothiazide (HYZAAR) 100-25 MG tablet TAKE 1 TABLET EVERY DAY BEFORE BREAKFAST 90 tablet 2  . mirabegron ER (MYRBETRIQ) 25 MG TB24 tablet Take 1 tablet (25 mg total) by mouth daily. 90 tablet 1  . Multiple Vitamins-Minerals (PRESERVISION AREDS) CAPS Take 2 capsules by mouth daily.    . Omega-3 Fatty Acids (FISH OIL) 435 MG CAPS Take 1 capsule by mouth daily.    . predniSONE (DELTASONE) 5 MG tablet Take 1 tablet (5 mg total) by mouth daily with breakfast. 90 tablet 3  .  rosuvastatin (CRESTOR) 5 MG tablet Take 1 tablet (5 mg total) by mouth daily. 90 tablet 2  . traZODone (DESYREL) 50 MG tablet TAKE 1/2 TO 1 TABLET (25-50 MG TOTAL) BY MOUTH AT BEDTIME AS NEEDED FOR SLEEP. 90 tablet 1   No current facility-administered medications on file prior to visit.     Past Medical History:  Diagnosis Date  . Arthritis   . Cancer (HCC)    hx of skin cancer   . Depression   . GERD (gastroesophageal reflux disease)   . Headache(784.0)   . History of migraines   . Hyperlipidemia   . Hypertension   . Macular degeneration   . Myasthenia gravis ([place])   . Spinal stenosis     . Thoracic aneurysm    4.4cm; see CT done 01/28/12 and 01/22/13 in [facility].   [name] Uses hearing aid   . Vitamin D deficiency     Past Surgical History:  Procedure Laterality Date  . ABDOMINAL HYSTERECTOMY    . CHOLECYSTECTOMY    . LUMBAR LAMINECTOMY/DECOMPRESSION MICRODISCECTOMY N/A 02/04/2013   Procedure: CENTRAL DECOMPRESSION/LUMBAR LAMINECTOMY L3-L4 AND L4-L5 2 LEVELS;  Surgeon: Tobi Bastos, MD;  Location: [facility];  Service: Orthopedics;  Laterality: N/A;  . SPINAL CORD STIMULATOR IMPLANT     in right hip but not currently using because it did not help  . TONSILLECTOMY    . US ECHOCARDIOGRAPHY  05/18/2007   EF 55-60%    Social History   Social History  . Marital status: Married    Spouse name: N/A  . Number of children: N/A  . Years of education: N/A   Social History Main Topics  . Smoking status: Never Smoker  . Smokeless tobacco: Never Used  . Alcohol use No  . Drug use: No  . Sexual activity: Not on file   Other Topics Concern  . Not on file   Social History Narrative   Lives with husband in a one story home.  Has 2 sons.  Retired.  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  Education: college.  [employer].    Family History  Problem Relation Age of Onset  . Heart attack Father   . Cancer Sister   . Cancer Brother   . Cancer Sister   . Cancer Sister   . Colon polyps Neg Hx   . Colon cancer Neg Hx   . Esophageal cancer Neg Hx   . Rectal cancer Neg Hx   . Stomach cancer Neg Hx   . Adrenal disorder Neg Hx     Review of Systems  Constitutional: Negative for chills and fatigue.  HENT: Positive for rhinorrhea and sinus pain. Negative for congestion, ear pain and sore throat.   Respiratory: Positive for shortness of breath (with exertion). Negative for cough and wheezing.   Cardiovascular: Negative for chest pain, palpitations and leg swelling.  Musculoskeletal: Positive for back pain.  Neurological: Positive for headaches. Negative for  dizziness and light-headedness.       Objective:   Vitals:   06/03/17 1348  BP: 114/76  Pulse: 95  Resp: 16  Temp: 97.8 F (36.6 C)   Filed Weights   06/03/17 1348  Weight: 159 lb (72.1 kg)   Body mass index is 26.46 kg/m.  Wt Readings from Last 3 Encounters:  06/03/17 159 lb (72.1 kg)  04/21/17 160 lb (72.6 kg)  03/28/17 160 lb (72.6 kg)     Physical Exam GENERAL APPEARANCE: Appears stated  age, well appearing, NAD EYES: conjunctiva clear, no icterus HEENT: bilateral tympanic membranes and ear canals normal, oropharynx with no erythema, no thyromegaly, trachea midline, no cervical or supraclavicular lymphadenopathy LUNGS: Clear to auscultation without wheeze or crackles, unlabored breathing, good air entry bilaterally HEART: Normal S1,S2 without murmurs EXTREMITIES: Without clubbing, cyanosis, or edema Psych; normal mood and affect        Assessment & Plan:   See Problem List for Assessment and Plan of chronic medical problems.

## 2017-06-03 NOTE — Assessment & Plan Note (Signed)
BP well controlled Current regimen effective and well tolerated Continue current medications at current doses  

## 2017-06-03 NOTE — Assessment & Plan Note (Signed)
Continue trazodone nightly.  

## 2017-06-03 NOTE — Assessment & Plan Note (Signed)
Controlled Will try stopping omeprazole and restarting zantac 150 mg twice daily

## 2017-06-03 NOTE — Patient Instructions (Addendum)
  No immunizations administered today.   Medications reviewed and updated.  Changes include increasing cymbalta to 40 mg daily.  Stop omeprazole and start zantac 150 mg twice daily.   Your prescription(s) have been submitted to your pharmacy. Please take as directed and contact our office if you believe you are having problem(s) with the medication(s).  Please followup in 6 months

## 2017-06-05 DIAGNOSIS — M9903 Segmental and somatic dysfunction of lumbar region: Secondary | ICD-10-CM | POA: Diagnosis not present

## 2017-06-05 DIAGNOSIS — M62830 Muscle spasm of back: Secondary | ICD-10-CM | POA: Diagnosis not present

## 2017-06-05 DIAGNOSIS — M545 Low back pain: Secondary | ICD-10-CM | POA: Diagnosis not present

## 2017-07-07 ENCOUNTER — Other Ambulatory Visit: Payer: Self-pay | Admitting: Internal Medicine

## 2017-07-15 ENCOUNTER — Other Ambulatory Visit: Payer: Self-pay | Admitting: Internal Medicine

## 2017-07-22 DIAGNOSIS — Z961 Presence of intraocular lens: Secondary | ICD-10-CM | POA: Diagnosis not present

## 2017-07-22 DIAGNOSIS — H353130 Nonexudative age-related macular degeneration, bilateral, stage unspecified: Secondary | ICD-10-CM | POA: Diagnosis not present

## 2017-07-22 DIAGNOSIS — H35373 Puckering of macula, bilateral: Secondary | ICD-10-CM | POA: Diagnosis not present

## 2017-07-22 DIAGNOSIS — H401121 Primary open-angle glaucoma, left eye, mild stage: Secondary | ICD-10-CM | POA: Diagnosis not present

## 2017-07-23 DIAGNOSIS — Z6826 Body mass index (BMI) 26.0-26.9, adult: Secondary | ICD-10-CM | POA: Diagnosis not present

## 2017-07-23 DIAGNOSIS — I10 Essential (primary) hypertension: Secondary | ICD-10-CM | POA: Diagnosis not present

## 2017-07-23 DIAGNOSIS — M48062 Spinal stenosis, lumbar region with neurogenic claudication: Secondary | ICD-10-CM | POA: Diagnosis not present

## 2017-07-24 DIAGNOSIS — M48062 Spinal stenosis, lumbar region with neurogenic claudication: Secondary | ICD-10-CM | POA: Diagnosis not present

## 2017-09-05 DIAGNOSIS — Z23 Encounter for immunization: Secondary | ICD-10-CM | POA: Diagnosis not present

## 2017-09-09 ENCOUNTER — Other Ambulatory Visit: Payer: Self-pay | Admitting: Internal Medicine

## 2017-09-11 ENCOUNTER — Encounter: Payer: Self-pay | Admitting: Nurse Practitioner

## 2017-09-11 ENCOUNTER — Ambulatory Visit (INDEPENDENT_AMBULATORY_CARE_PROVIDER_SITE_OTHER): Payer: Medicare Other | Admitting: Nurse Practitioner

## 2017-09-11 VITALS — BP 118/70 | HR 105 | Temp 97.5°F | Ht 65.0 in | Wt 154.0 lb

## 2017-09-11 DIAGNOSIS — R5381 Other malaise: Secondary | ICD-10-CM

## 2017-09-11 DIAGNOSIS — R5383 Other fatigue: Secondary | ICD-10-CM | POA: Diagnosis not present

## 2017-09-11 DIAGNOSIS — R61 Generalized hyperhidrosis: Secondary | ICD-10-CM

## 2017-09-11 NOTE — Progress Notes (Signed)
Subjective:  Patient ID: Tamara Gregory, female    DOB: 04/23/1925  Age: 83 y.o. MRN: [id number]  CC: Follow-up (feels bad all over,weak, excessive sweat---review meds. ) and Fall (fell 5 days ago--hit head--didnt go to the ER)  HPI  accompanied by husband.  Fall: Incident 5days ago. Got dizziness while standing and fell backward at home. Injury to left of head. No blood loss No LOC. No altered mental status since fall.  Medication review: Due to chronic excessive sweating, fatigue and weakness, they want medcations reviewed to determined if any is responsible to symptoms. Exacerbation by increase anxiety and exertion. Excessive sweating initially documented 03/19/2017 by Dr. Quay Burow. At this time cymbalta was thought to have caused symptom. Husband states it has been going on for several years, hence prior to 03/2017. Cymbalta was initially prescribed 05/2016, then held for 2weeks in 03/2017 but no improvement in sweating, it was then resumed later in  03/2017, then dose increase 06/2017. She has been on Prednisone low dose since 2015 for myasthenia gravis (managed by neurology: Dr. Posey Pronto, last seen 11/2016). Husband states she has an upcoming appt next week. Trazodone was first prescribed 01/2017.  Other medications were prescribed since 2012 by previous pcp. No change in dose in last 2years. She was Referred to endocrinology for further evaluation, no abnormal finding (04/2017).  Outpatient Medications Prior to Visit  Medication Sig Dispense Refill  . amLODipine (NORVASC) 5 MG tablet Take 1/2 tablet by mouth daily    . Calcium Carbonate (CALCIUM 500 PO) Take 1 capsule by mouth daily.     . cholecalciferol (VITAMIN D) 1000 UNITS tablet Take 2,000 Units by mouth daily.     . DULoxetine (CYMBALTA) 20 MG capsule Take 2 capsules (40 mg total) by mouth daily. 614 capsule 1  . folic acid (FOLVITE) 431 MCG tablet Take 400 mcg by mouth daily.    . Glucosamine-Chondroit-Vit C-Mn (GLUCOSAMINE  1500 COMPLEX PO) Take 1 tablet by mouth daily.     [name] latanoprost (XALATAN) 0.005 % ophthalmic solution Place 1 drop into the left eye at bedtime.  1  . losartan-hydrochlorothiazide (HYZAAR) 100-25 MG tablet TAKE 1 TABLET EVERY DAY BEFORE BREAKFAST 90 tablet 2  . mirabegron ER (MYRBETRIQ) 25 MG TB24 tablet Take 1 tablet (25 mg total) by mouth daily. 90 tablet 1  . Multiple Vitamins-Minerals (PRESERVISION AREDS) CAPS Take 2 capsules by mouth daily.    . Omega-3 Fatty Acids (FISH OIL) 435 MG CAPS Take 1 capsule by mouth daily.    . predniSONE (DELTASONE) 5 MG tablet Take 1 tablet (5 mg total) by mouth daily with breakfast. 90 tablet 3  . rosuvastatin (CRESTOR) 5 MG tablet TAKE 1 TABLET EVERY DAY 90 tablet 1  . traZODone (DESYREL) 50 MG tablet TAKE 1/2 TO 1 TABLET (25-50 MG TOTAL) BY MOUTH AT BEDTIME AS NEEDED FOR SLEEP. 90 tablet 1  . ranitidine (ZANTAC) 150 MG tablet TAKE 1 TABLET (150 MG TOTAL) BY MOUTH AT BEDTIME. (Patient not taking: Reported on 09/11/2017) 90 tablet 1   No facility-administered medications prior to visit.     ROS See HPI  Objective:  BP 118/70   Pulse (!) 105   Temp (!) 97.5 F (36.4 C)   Ht 5\' 5"  (1.651 m)   Wt 154 lb (69.9 kg)   SpO2 94%   BMI 25.63 kg/m   BP Readings from Last 3 Encounters:  09/11/17 118/70  06/03/17 114/76  04/21/17 116/68    Wt Readings from Last  3 Encounters:  09/11/17 154 lb (69.9 kg)  06/03/17 159 lb (72.1 kg)  04/21/17 160 lb (72.6 kg)    Physical Exam  Constitutional: She is oriented to person, place, and time. No distress.  HENT:  Palpated Hematoma on left parietal lobe, skin intact.  Neck: Normal range of motion. Neck supple.  Cardiovascular: Normal rate.   Pulmonary/Chest: Effort normal.  Musculoskeletal: She exhibits no edema.  Neurological: She is alert and oriented to person, place, and time.  Ambulates with walker  Skin: No rash noted. No erythema.  Vitals reviewed.   Lab Results  Component Value Date   WBC  8.8 03/27/2017   HGB 11.0 (L) 03/27/2017   HCT 33.2 (L) 03/27/2017   PLT 314.0 03/27/2017   GLUCOSE 236 (H) 03/27/2017   CHOL 174 02/28/2016   TRIG 97 02/28/2016   HDL 77 02/28/2016   LDLDIRECT 139.3 10/04/2013   LDLCALC 78 02/28/2016   ALT 18 03/19/2017   AST 17 03/19/2017   NA 141 03/27/2017   K 5.0 03/27/2017   CL 102 03/27/2017   CREATININE 0.95 03/27/2017   BUN 19 03/27/2017   CO2 28 03/27/2017   TSH 1.71 03/19/2017   INR 1.01 01/21/2013   HGBA1C 6.1 03/19/2017    Ct Lumbar Spine W Contrast  Result Date: 01/28/2017 CLINICAL DATA:  Low back pain.  Lower extremity weakness. EXAM: LUMBAR MYELOGRAM FLUOROSCOPY TIME:  Radiation Exposure Index (as provided by the fluoroscopic device): 206.34 microGray*m^2 Fluoroscopy Time (in minutes and seconds):  23 second PROCEDURE: After thorough discussion of risks and benefits of the procedure including bleeding, infection, injury to nerves, blood vessels, adjacent structures as well as headache and CSF leak, written and oral informed consent was obtained. Consent was obtained by Dr. Logan Bores. Time out form was completed. Patient was positioned prone on the fluoroscopy table. Local anesthesia was provided with 1% lidocaine without epinephrine after prepped and draped in the usual sterile fashion. Puncture was performed at L2-3 using a 3 1/2 inch 22-gauge spinal needle via a left interlaminar approach. Using a single pass through the dura, the needle was placed within the thecal sac, with return of clear CSF. 15 mL of Isovue M 200 was injected into the thecal sac, with normal opacification of the nerve roots and cauda equina consistent with free flow within the subarachnoid space. I personally performed the lumbar puncture and administered the intrathecal contrast. I also personally supervised acquisition of the myelogram images. TECHNIQUE: Contiguous axial images were obtained through the Lumbar spine after the intrathecal infusion of infusion.  Coronal and sagittal reconstructions were obtained of the axial image sets. COMPARISON:  Lumbar spine MRI 08/20/2013 and myelogram 12/31/2012 FINDINGS: LUMBAR MYELOGRAM FINDINGS: Lumbar segmentation is normal. There is mild lumbar levoscoliosis. There is minimal retrolisthesis of L3 on L4 which partially reduces with flexion. There is minimal anterolisthesis of L5 on S1 which does not change with flexion or extension. A dorsal column stimulator enters the spinal canal at L1-2 with one lead terminating at T9 and the other continuing further cephalad to at least the T8 level, incompletely visualized. There is waist like narrowing of the thecal sac at L3-4 with evidence of bilateral lateral recess stenosis affecting the L4 nerve root sleeves. There is evidence of moderate lateral recess narrowing at L2-3 and on the right at L4-5. CT LUMBAR MYELOGRAM FINDINGS: There is mild lumbar levoscoliosis. Minimal retrolisthesis of L3 on L4 and minimal anterolisthesis of L5 on S1 are unchanged from the prior MRI.  No fracture or destructive osseous process S is identified. A dorsal column stimulator enters the canal at L1-2 and courses into the thoracic spine, incompletely imaged. The conus medullaris terminates at L1. Diffuse aortoiliac atherosclerosis is noted. 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 A cystic focus is partially visualized in the right adnexa measuring at least 3.4 cm in diameter, not clearly present on the prior myelogram although this may be related to the more limited field of view on that examination. Right adnexal cyst versus hydrosalpinx was described on older pelvic ultrasounds and CT both from 2005. T11-12: Circumferential disc bulging, ligamentum flavum thickening, and moderate facet arthrosis result in mild spinal stenosis,  unchanged from the prior MRI. Right greater than left neural foraminal narrowing is suspected but was incompletely imaged. T12-L1: Left subarticular to extraforaminal disc protrusion and moderate facet arthrosis result in mild left lateral recess and moderate left neural foraminal stenosis. Neural foraminal stenosis has progressed. No significant spinal stenosis. L1-2: Disc bulging eccentric to the left and mild facet and ligamentum flavum hypertrophy result in moderate left lateral recess stenosis and mild left neural foraminal stenosis. Left lateral recess stenosis has progressed and may affect the left L2 nerve roots. L2-3: Disc bulging asymmetric to the left and moderate facet and ligamentum flavum hypertrophy result in mild right lateral recess stenosis without significant spinal or neural foraminal stenosis, unchanged. L3-4: Severe disc space narrowing asymmetric to the right with degenerative endplate sclerosis and vacuum disc phenomenon. Circumferential disc bulging, ligamentum flavum thickening, and advanced facet arthrosis result in mild bilateral lateral recess stenosis, mild spinal stenosis (in the transverse/left-to-right dimension) and moderate right neural foraminal stenosis, unchanged from the prior MRI. L4-5: Severe disc space narrowing with vacuum disc. Prior laminectomies. No spinal stenosis. Circumferential disc osteophyte complex and advanced facet arthrosis result in mild right greater than left lateral recess narrowing and moderate right neural foraminal stenosis, unchanged from the prior MRI. There is epidural gas in the left lateral recess behind the L4 vertebral body extending into the superomedial left neural foramen, unchanged in location compared to the prior myelogram although greater in volume. This is presumably arising from the disc space rather than reflecting a synovial cyst from the facet joint given the lack of gas within the facet joint. This gas may be associated with a  residual disc extrusion potentially contributing to left-sided neural foraminal stenosis which appears moderate in degree, however the foraminal stenosis is largely related to facet and endplate spurring. Potential left L4 nerve root impingement. L5-S1: Listhesis with mild disc uncovering and advanced left greater than right facet arthrosis result in mild left neural foraminal stenosis, unchanged. No spinal stenosis. IMPRESSION: 1. Increased size of leftward disc protrusion at T12-L1 with mild left lateral recess and moderate left foraminal stenosis. 2. Progressive moderate left lateral recess stenosis at L1-2. 3. Unchanged mild spinal, mild lateral recess, and moderate right foraminal stenosis at L3-4. 4. Moderate left greater than right neural foraminal stenosis at L4-5. Epidural gas may reflect an underlying disc extrusion contributing to the left lateral recess and medial left foraminal stenosis. 5. Severe facet arthrosis at L5-S1 without stenosis. Electronically Signed   By: Logan Bores M.D.   On: 01/28/2017 16:28   Dg Myelography Lumbar Inj Lumbosacral  Result Date: 01/28/2017 CLINICAL DATA:  Low back pain.  Lower extremity weakness. EXAM: LUMBAR MYELOGRAM  FLUOROSCOPY TIME:  Radiation Exposure Index (as provided by the fluoroscopic device): 206.34 microGray*m^2 Fluoroscopy Time (in minutes and seconds):  23 second PROCEDURE: After thorough discussion of risks and benefits of the procedure including bleeding, infection, injury to nerves, blood vessels, adjacent structures as well as headache and CSF leak, written and oral informed consent was obtained. Consent was obtained by Dr. Logan Bores. Time out form was completed. Patient was positioned prone on the fluoroscopy table. Local anesthesia was provided with 1% lidocaine without epinephrine after prepped and draped in the usual sterile fashion. Puncture was performed at L2-3 using a 3 1/2 inch 22-gauge spinal needle via a left interlaminar approach. Using  a single pass through the dura, the needle was placed within the thecal sac, with return of clear CSF. 15 mL of Isovue M 200 was injected into the thecal sac, with normal opacification of the nerve roots and cauda equina consistent with free flow within the subarachnoid space. I personally performed the lumbar puncture and administered the intrathecal contrast. I also personally supervised acquisition of the myelogram images. TECHNIQUE: Contiguous axial images were obtained through the Lumbar spine after the intrathecal infusion of infusion. Coronal and sagittal reconstructions were obtained of the axial image sets. COMPARISON:  Lumbar spine MRI 08/20/2013 and myelogram 12/31/2012 FINDINGS: LUMBAR MYELOGRAM FINDINGS: Lumbar segmentation is normal. There is mild lumbar levoscoliosis. There is minimal retrolisthesis of L3 on L4 which partially reduces with flexion. There is minimal anterolisthesis of L5 on S1 which does not change with flexion or extension. A dorsal column stimulator enters the spinal canal at L1-2 with one lead terminating at T9 and the other continuing further cephalad to at least the T8 level, incompletely visualized. There is waist like narrowing of the thecal sac at L3-4 with evidence of bilateral lateral recess stenosis affecting the L4 nerve root sleeves. There is evidence of moderate lateral recess narrowing at L2-3 and on the right at L4-5. CT LUMBAR MYELOGRAM FINDINGS: There is mild lumbar levoscoliosis. Minimal retrolisthesis of L3 on L4 and minimal anterolisthesis of L5 on S1 are unchanged from the prior MRI. No fracture or destructive osseous process S is identified. A dorsal column stimulator enters the canal at L1-2 and courses into the thoracic spine, incompletely imaged. The conus medullaris terminates at L1. Diffuse aortoiliac atherosclerosis is noted. 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 A cystic focus is partially visualized in the right adnexa measuring at least 3.4 cm in diameter, not clearly present on the prior myelogram although this may be related to the more limited field of view on that examination. Right adnexal cyst versus hydrosalpinx was described on older pelvic ultrasounds and CT both from 2005. T11-12: Circumferential disc bulging, ligamentum flavum thickening, and moderate facet arthrosis result in mild spinal stenosis, unchanged from the prior MRI. Right greater than left neural foraminal narrowing is suspected but was incompletely imaged. T12-L1: Left subarticular to extraforaminal disc protrusion and moderate facet arthrosis result in mild left lateral recess and moderate left neural foraminal stenosis. Neural foraminal stenosis has progressed. No significant spinal stenosis. L1-2: Disc bulging eccentric to the left and mild facet and ligamentum flavum hypertrophy result in moderate left lateral recess stenosis and mild left neural foraminal stenosis. Left lateral recess stenosis has progressed and may affect the left L2 nerve roots. L2-3: Disc bulging asymmetric to the left  and moderate facet and ligamentum flavum hypertrophy result in mild right lateral recess stenosis without significant spinal or neural foraminal stenosis, unchanged. L3-4: Severe disc space narrowing asymmetric to the right with degenerative endplate sclerosis and vacuum disc phenomenon. Circumferential disc bulging, ligamentum flavum thickening, and advanced facet arthrosis result in mild bilateral lateral recess stenosis, mild spinal stenosis (in the transverse/left-to-right dimension) and moderate right neural foraminal stenosis, unchanged from the prior MRI. L4-5: Severe disc space narrowing with vacuum disc. Prior laminectomies. No spinal stenosis. Circumferential disc osteophyte complex and advanced facet arthrosis  result in mild right greater than left lateral recess narrowing and moderate right neural foraminal stenosis, unchanged from the prior MRI. There is epidural gas in the left lateral recess behind the L4 vertebral body extending into the superomedial left neural foramen, unchanged in location compared to the prior myelogram although greater in volume. This is presumably arising from the disc space rather than reflecting a synovial cyst from the facet joint given the lack of gas within the facet joint. This gas may be associated with a residual disc extrusion potentially contributing to left-sided neural foraminal stenosis which appears moderate in degree, however the foraminal stenosis is largely related to facet and endplate spurring. Potential left L4 nerve root impingement. L5-S1: Listhesis with mild disc uncovering and advanced left greater than right facet arthrosis result in mild left neural foraminal stenosis, unchanged. No spinal stenosis. IMPRESSION: 1. Increased size of leftward disc protrusion at T12-L1 with mild left lateral recess and moderate left foraminal stenosis. 2. Progressive moderate left lateral recess stenosis at L1-2. 3. Unchanged mild spinal, mild lateral recess, and moderate right foraminal stenosis at L3-4. 4. Moderate left greater than right neural foraminal stenosis at L4-5. Epidural gas may reflect an underlying disc extrusion contributing to the left lateral recess and medial left foraminal stenosis. 5. Severe facet arthrosis at L5-S1 without stenosis. Electronically Signed   By: Logan Bores M.D.   On: 01/28/2017 16:28    Assessment & Plan:   Jodiann was seen today for follow-up and fall.  Diagnoses and all orders for this visit:  Sweating profusely  Malaise and fatigue   I am having Ms. Jamerson maintain her Glucosamine-Chondroit-Vit C-Mn (GLUCOSAMINE 1500 COMPLEX PO), Calcium Carbonate (CALCIUM 500 PO), cholecalciferol, folic acid, Fish Oil, PRESERVISION AREDS, amLODipine,  latanoprost, predniSONE, mirabegron ER, traZODone, DULoxetine, ranitidine, losartan-hydrochlorothiazide, rosuvastatin, and omeprazole.  Meds ordered this encounter  Medications  . omeprazole (PRILOSEC) 40 MG capsule    Follow-up: No Follow-up on file.  Wilfred Lacy, NP

## 2017-09-11 NOTE — Patient Instructions (Addendum)
Unsure as to onset of symptoms and if related to any of current medications?  Since symptoms did not improve with discontinuation of cymbalta, it is less likely related to this medication. Diaphoresis is not one of the known side effects of trazodone, hence this is not likely the cause of symptom either.  I encourage patient to discuss possible side effects of long term use of prednisone with neurology.  Encourage Home physical therapy to improve endurance and stability. Patient declined.

## 2017-09-16 DIAGNOSIS — M48062 Spinal stenosis, lumbar region with neurogenic claudication: Secondary | ICD-10-CM | POA: Diagnosis not present

## 2017-09-16 DIAGNOSIS — Z6825 Body mass index (BMI) 25.0-25.9, adult: Secondary | ICD-10-CM | POA: Diagnosis not present

## 2017-09-22 ENCOUNTER — Other Ambulatory Visit: Payer: Self-pay | Admitting: Neurology

## 2017-09-23 ENCOUNTER — Other Ambulatory Visit: Payer: Self-pay | Admitting: *Deleted

## 2017-09-23 MED ORDER — PREDNISONE 5 MG PO TABS: 5.0000 mg | ORAL_TABLET | Freq: Every day | ORAL | 0 refills | Status: DC

## 2017-11-06 ENCOUNTER — Other Ambulatory Visit: Payer: Self-pay

## 2017-11-18 NOTE — Patient Instructions (Addendum)
Test(s) ordered today. Your results will be released to [employer] (or called to you) after review, usually within 72hours after test completion. If any changes need to be made, you will be notified at that same time.  Medications reviewed and updated.  Changes include increasing cymbalta to 60 mg daily.  Your prescription(s) have been submitted to your pharmacy. Please take as directed and contact our office if you believe you are having problem(s) with the medication(s).  Please followup in 6 motnhs  Continue doing brain stimulating activities (puzzles, reading, adult coloring books, staying active) to keep memory sharp.   Continue to eat heart healthy diet (full of fruits, vegetables, whole grains, lean protein, water--limit salt, fat, and sugar intake) and increase physical activity as tolerated.   Tamara Gregory , Thank you for taking time to come for your Medicare Wellness Visit. I appreciate your ongoing commitment to your health goals. Please review the following plan we discussed and let me know if I can assist you in the future.   These are the goals we discussed: Goals    . Patient Stated     Stay as healthy and as independent as possible       This is a list of the screening recommended for you and due dates:  Health Maintenance  Topic Date Due  . Tetanus Vaccine  01/20/1944  . Pneumonia vaccines (2 of 2 - PCV13) 08/12/2014  . Flu Shot  07/02/2017  . DEXA scan (bone density measurement)  08/21/2018

## 2017-11-18 NOTE — Progress Notes (Signed)
Subjective:    Patient ID: Tamara Gregory, female    DOB: Jan 05, 1925, 83 y.o.   MRN: [id number]  HPI The patient is here for follow up.  Depression: She is taking her medication daily as prescribed. She denies any side effects from the medication. She feels her depression is not well controlled.     Excessive sweating:  She still has episodes of sweating and has episodes of tremors/quivers and usually loses her bladder control during this.  She does have urinary incontinence outside of these episodes. She is taking myrbetriq.  She will be seeing a urologist.  She is not interested in increasing the myrbetriq at this time - she wants to see urology.   Chronic back pain:  She has been taking ES Excedrin for her pain  - she just started this.  It does provide some relief of her back pain.  She takes 2 in the morning and sometimes she takes it twice, but she just started this.  She knows she should probably not take this regularly, but it is the only thing that is helping.    GERD:  She is taking her medication daily as prescribed.  She denies any GERD symptoms and feels her GERD is well controlled.   Hyperlipidemia: She is taking her medication daily. She is compliant with a low fat/cholesterol diet. She is not exercising regularly. She denies myalgias.   Myasthenia gravis:  She is following with neurology.  She takes prednisone daily.    Insomnia:  She is taking her medication nightly and overall feels her sleep is good.    She has had several falls.  It has occurred when getting up from a chair. She has not always had her walker right in front of her, which is the problem.   Medications and allergies reviewed with patient and updated if appropriate.  Patient Active Problem List   Diagnosis Date Noted  . Large hiatal hernia 04/01/2017  . Sweating profusely 03/19/2017  . Hyperglycemia 03/19/2017  . Squamous cell skin cancer 11/05/2016  . Nosebleed 10/19/2016  . Osteopenia 08/28/2016    . Essential hypertension, benign 08/21/2016  . Long term current use of systemic steroids 08/21/2016  . GERD (gastroesophageal reflux disease) 05/06/2016  . Mixed incontinence urge and stress 05/06/2016  . Low hemoglobin 09/22/2014  . Insomnia 07/26/2014  . Malaise and fatigue 10/04/2013  . PAC (premature atrial contraction) 10/04/2013  . Depression (emotion) 02/15/2013  . Spinal stenosis, lumbar region, with neurogenic claudication 02/04/2013  . Myasthenia gravis ([place]) 03/17/2012  . Pure hypercholesterolemia 09/05/2011  . Benign hypertensive heart disease without heart failure 09/05/2011  . Osteoarthritis 09/05/2011    Current Outpatient Medications on File Prior to Visit  Medication Sig Dispense Refill  . Calcium Carbonate (CALCIUM 500 PO) Take 1 capsule by mouth daily.     . cholecalciferol (VITAMIN D) 1000 UNITS tablet Take 2,000 Units by mouth daily.     . folic acid (FOLVITE) 539 MCG tablet Take 400 mcg by mouth daily.    . Glucosamine-Chondroit-Vit C-Mn (GLUCOSAMINE 1500 COMPLEX PO) Take 1 tablet by mouth daily.     [name] latanoprost (XALATAN) 0.005 % ophthalmic solution Place 1 drop into the left eye at bedtime.  1  . losartan-hydrochlorothiazide (HYZAAR) 100-25 MG tablet TAKE 1 TABLET EVERY DAY BEFORE BREAKFAST 90 tablet 2  . mirabegron ER (MYRBETRIQ) 25 MG TB24 tablet Take 1 tablet (25 mg total) by mouth daily. 90 tablet 1  . Multiple Vitamins-Minerals (PRESERVISION  AREDS) CAPS Take 2 capsules by mouth daily.    . Omega-3 Fatty Acids (FISH OIL) 435 MG CAPS Take 1 capsule by mouth daily.    [name] omeprazole (PRILOSEC) 40 MG capsule     . rosuvastatin (CRESTOR) 5 MG tablet TAKE 1 TABLET EVERY DAY 90 tablet 1  . traZODone (DESYREL) 50 MG tablet TAKE 1/2 TO 1 TABLET (25-50 MG TOTAL) BY MOUTH AT BEDTIME AS NEEDED FOR SLEEP. 90 tablet 1   No current facility-administered medications on file prior to visit.     Past Medical History:  Diagnosis Date  . Arthritis   . Cancer (HCC)     hx of skin cancer   . Depression   . GERD (gastroesophageal reflux disease)   . Headache(784.0)   . History of migraines   . Hyperlipidemia   . Hypertension   . Macular degeneration   . Myasthenia gravis ([place])   . Spinal stenosis   . Thoracic aneurysm    4.4cm; see CT done 01/28/12 and 01/22/13 in [facility].   [name] Uses hearing aid   . Vitamin D deficiency     Past Surgical History:  Procedure Laterality Date  . ABDOMINAL HYSTERECTOMY    . CHOLECYSTECTOMY    . LUMBAR LAMINECTOMY/DECOMPRESSION MICRODISCECTOMY N/A 02/04/2013   Procedure: CENTRAL DECOMPRESSION/LUMBAR LAMINECTOMY L3-L4 AND L4-L5 2 LEVELS;  Surgeon: Tobi Bastos, MD;  Location: [facility];  Service: Orthopedics;  Laterality: N/A;  . SPINAL CORD STIMULATOR IMPLANT     in right hip but not currently using because it did not help  . TONSILLECTOMY    . US ECHOCARDIOGRAPHY  05/18/2007   EF 55-60%    Social History   Socioeconomic History  . Marital status: Married    Spouse name: None  . Number of children: None  . Years of education: None  . Highest education level: None  Social Needs  . Financial resource strain: None  . Food insecurity - worry: None  . Food insecurity - inability: None  . Transportation needs - medical: None  . Transportation needs - non-medical: None  Occupational History  . None  Tobacco Use  . Smoking status: Never Smoker  . Smokeless tobacco: Never Used  Substance and Sexual Activity  . Alcohol use: No  . Drug use: No  . Sexual activity: None  Other Topics Concern  . None  Social History Narrative   Lives with husband in a one story home.  Has 2 sons.  Retired.  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  Education: college.  [employer].    Family History  Problem Relation Age of Onset  . Heart attack Father   . Cancer Sister   . Cancer Brother   . Cancer Sister   . Cancer Sister   . Colon polyps Neg Hx   . Colon cancer Neg Hx   . Esophageal cancer Neg Hx   .  Rectal cancer Neg Hx   . Stomach cancer Neg Hx   . Adrenal disorder Neg Hx     Review of Systems  Constitutional: Negative for chills and fever.  Respiratory: Positive for shortness of breath (with any exertion). Negative for cough and wheezing.   Cardiovascular: Positive for chest pain (rare). Negative for palpitations and leg swelling.  Neurological: Positive for headaches. Negative for light-headedness.       Objective:   Vitals:   11/19/17 1137  BP: 124/72  Pulse: (!) 58  Resp: 16  Temp: 98.3  F (36.8 C)  SpO2: 94%   Wt Readings from Last 3 Encounters:  11/19/17 151 lb (68.5 kg)  09/11/17 154 lb (69.9 kg)  06/03/17 159 lb (72.1 kg)   Body mass index is 25.13 kg/m.   Physical Exam    Constitutional: Appears well-developed and well-nourished. No distress.  HENT:  Head: Normocephalic and atraumatic.  Neck: Neck supple. No tracheal deviation present. No thyromegaly present.  No cervical lymphadenopathy Cardiovascular: Normal rate, regular rhythm and normal heart sounds.   No murmur heard. No carotid bruit .  No edema Pulmonary/Chest: Effort normal and breath sounds normal. No respiratory distress. No has no wheezes. No rales.  Skin: Skin is warm and dry. Not diaphoretic.  Psychiatric: Normal mood and affect. Behavior is normal.      Assessment & Plan:    See Problem List for Assessment and Plan of chronic medical problems.

## 2017-11-19 ENCOUNTER — Other Ambulatory Visit (INDEPENDENT_AMBULATORY_CARE_PROVIDER_SITE_OTHER): Payer: Medicare Other

## 2017-11-19 ENCOUNTER — Encounter: Payer: Self-pay | Admitting: Internal Medicine

## 2017-11-19 ENCOUNTER — Other Ambulatory Visit: Payer: Self-pay | Admitting: Neurology

## 2017-11-19 ENCOUNTER — Encounter: Payer: Self-pay | Admitting: Cardiovascular Disease

## 2017-11-19 ENCOUNTER — Ambulatory Visit (INDEPENDENT_AMBULATORY_CARE_PROVIDER_SITE_OTHER): Payer: Medicare Other | Admitting: Cardiovascular Disease

## 2017-11-19 ENCOUNTER — Ambulatory Visit (INDEPENDENT_AMBULATORY_CARE_PROVIDER_SITE_OTHER): Payer: Medicare Other | Admitting: Internal Medicine

## 2017-11-19 VITALS — BP 124/72 | HR 58 | Temp 98.3°F | Resp 16 | Wt 151.0 lb

## 2017-11-19 VITALS — BP 102/66 | HR 84 | Ht 65.0 in | Wt 151.1 lb

## 2017-11-19 DIAGNOSIS — M48062 Spinal stenosis, lumbar region with neurogenic claudication: Secondary | ICD-10-CM

## 2017-11-19 DIAGNOSIS — I951 Orthostatic hypotension: Secondary | ICD-10-CM | POA: Diagnosis not present

## 2017-11-19 DIAGNOSIS — E7800 Pure hypercholesterolemia, unspecified: Secondary | ICD-10-CM | POA: Diagnosis not present

## 2017-11-19 DIAGNOSIS — G4700 Insomnia, unspecified: Secondary | ICD-10-CM

## 2017-11-19 DIAGNOSIS — I10 Essential (primary) hypertension: Secondary | ICD-10-CM

## 2017-11-19 DIAGNOSIS — F3289 Other specified depressive episodes: Secondary | ICD-10-CM

## 2017-11-19 DIAGNOSIS — Z0000 Encounter for general adult medical examination without abnormal findings: Secondary | ICD-10-CM | POA: Diagnosis not present

## 2017-11-19 DIAGNOSIS — R739 Hyperglycemia, unspecified: Secondary | ICD-10-CM

## 2017-11-19 DIAGNOSIS — Z23 Encounter for immunization: Secondary | ICD-10-CM

## 2017-11-19 DIAGNOSIS — K219 Gastro-esophageal reflux disease without esophagitis: Secondary | ICD-10-CM

## 2017-11-19 LAB — CBC WITH DIFFERENTIAL/PLATELET
BASOS PCT: 0.6 % (ref 0.0–3.0)
Basophils Absolute: 0.1 10*3/uL (ref 0.0–0.1)
EOS PCT: 1 % (ref 0.0–5.0)
Eosinophils Absolute: 0.1 10*3/uL (ref 0.0–0.7)
HCT: 31.3 % — ABNORMAL LOW (ref 36.0–46.0)
HEMOGLOBIN: 9.8 g/dL — AB (ref 12.0–15.0)
LYMPHS ABS: 1 10*3/uL (ref 0.7–4.0)
Lymphocytes Relative: 11.6 % — ABNORMAL LOW (ref 12.0–46.0)
MCHC: 31.3 g/dL (ref 30.0–36.0)
MCV: 82.8 fl (ref 78.0–100.0)
MONO ABS: 0.8 10*3/uL (ref 0.1–1.0)
Monocytes Relative: 9.5 % (ref 3.0–12.0)
NEUTROS ABS: 6.7 10*3/uL (ref 1.4–7.7)
Neutrophils Relative %: 77.3 % — ABNORMAL HIGH (ref 43.0–77.0)
PLATELETS: 347 10*3/uL (ref 150.0–400.0)
RBC: 3.78 Mil/uL — ABNORMAL LOW (ref 3.87–5.11)
RDW: 16.9 % — AB (ref 11.5–15.5)
WBC: 8.7 10*3/uL (ref 4.0–10.5)

## 2017-11-19 LAB — LIPID PANEL
CHOLESTEROL: 157 mg/dL (ref 0–200)
HDL: 67.2 mg/dL (ref >39.00)
LDL CALC: 61 mg/dL (ref 0–99)
NonHDL: 89.83
Total CHOL/HDL Ratio: 2
Triglycerides: 142 mg/dL (ref 0.0–149.0)
VLDL: 28.4 mg/dL (ref 0.0–40.0)

## 2017-11-19 LAB — COMPREHENSIVE METABOLIC PANEL
ALT: 15 U/L (ref 0–35)
AST: 16 U/L (ref 0–37)
Albumin: 4.2 g/dL (ref 3.5–5.2)
Alkaline Phosphatase: 60 U/L (ref 39–117)
BUN: 17 mg/dL (ref 6–23)
CHLORIDE: 103 meq/L (ref 96–112)
CO2: 26 meq/L (ref 19–32)
Calcium: 9.7 mg/dL (ref 8.4–10.5)
Creatinine, Ser: 0.79 mg/dL (ref 0.40–1.20)
GFR: 72.53 mL/min (ref >60.00)
GLUCOSE: 113 mg/dL — AB (ref 70–99)
POTASSIUM: 3.4 meq/L — AB (ref 3.5–5.1)
SODIUM: 140 meq/L (ref 135–145)
Total Bilirubin: 0.3 mg/dL (ref 0.2–1.2)
Total Protein: 7.4 g/dL (ref 6.0–8.3)

## 2017-11-19 LAB — TSH: TSH: 3.53 u[IU]/mL (ref 0.35–4.50)

## 2017-11-19 LAB — HEMOGLOBIN A1C: HEMOGLOBIN A1C: 6.3 % (ref 4.6–6.5)

## 2017-11-19 MED ORDER — LOSARTAN POTASSIUM-HCTZ 50-12.5 MG PO TABS: 1.0000 | ORAL_TABLET | Freq: Every day | ORAL | 3 refills | Status: DC

## 2017-11-19 MED ORDER — LOSARTAN POTASSIUM-HCTZ 50-12.5 MG PO TABS
1.0000 | ORAL_TABLET | Freq: Every day | ORAL | 3 refills | Status: DC
Start: 2017-11-19 — End: 2017-11-19

## 2017-11-19 MED ORDER — DULOXETINE HCL 60 MG PO CPEP: 60.0000 mg | ORAL_CAPSULE | Freq: Every day | ORAL | 1 refills | Status: DC

## 2017-11-19 NOTE — Assessment & Plan Note (Signed)
Has spinal stimulator Follows with Dr Lovenia Shuck Has just started taking excedrin es -- advised her of the risks including it not being good for her heart, risk of kidney disease and GI bleeding Advised her to discuss this with her other specialists, and that it is not a good idea for her to take on a regular basis She understands the risks, but is looking for improving the quality of her life

## 2017-11-19 NOTE — Assessment & Plan Note (Signed)
Controlled, stable Continue current dose of medication  

## 2017-11-19 NOTE — Assessment & Plan Note (Signed)
BP well controlled Current regimen effective and well tolerated Continue current medications at current doses cmp  

## 2017-11-19 NOTE — Assessment & Plan Note (Signed)
Check lipid panel  Continue daily statin  healthy diet encouraged  

## 2017-11-19 NOTE — Assessment & Plan Note (Signed)
Check a1c Low sugar / carb diet   

## 2017-11-19 NOTE — Progress Notes (Signed)
CARDIOLOGY OFFICE NOTE  Date:  11/19/2017    Tamara Gregory Date of Birth: [birth year] Medical Record #[id number]  PCP:  Tamara Rail, MD  Cardiologist:  Former patient of Tamara Gregory.   Now Tamara Gregory  Chief Complaint  Patient presents with  . Hypertension    Notes from Tamara Gregory.  Tamara Gregory is a 83 y.o. female who presents today for a 4 month check. Former patient of Tamara Gregory.   She has a past history of HLD, HTN and myasthenia gravis and is followed for that at [facility]. She has had previous back surgery by Dr. Gladstone Gregory. She has a known 4 cm fusiform ascending aortic aneurysm . When the patient was initially admitted for her back surgery she had some chest pain in the preoperative holding area and her back surgery had to be postponed while she underwent a cardiac workup. She had a Myoview stress test which showed no ischemia. Her back surgery was subsequently rescheduled and performed without incident. She made a good recovery from her back surgery. However she has continued to have back pain and for this reason she underwent a implantation of a spinal cord stimulator as an outpatient at [facility] outpatient facility on 01/27/2014. The patient remains on low-dose prednisone 5 mg daily for her myasthenia gravis.   In March of 2015 she was hospitalized overnight at [facility] for chest pain. She had a dobutamine stress echo the following morning which showed no evidence of ischemia and she was released. She was admitted with bradycardia and her metoprolol was stopped and has not been restarted.  Last visit back in November and she was felt to be doing well.   Comes back today. Here with her husband who is also being seen. She is mostly limited by her back pain - going to [place] later today. Taking at least 2 sometimes 3 Excedrins on a daily basis. Some belly pain. Husband notes stool has been  dark. She has early satiety. No chest pain. Breathing is good but she is more sedentary and will have some DOE with much activity. [place] the beach - [place] there today. Has PCP arranged for June.   Sept. 21, 2017:  Tamara Gregory is seen today for the first time.   Previous patient of Tamara Gregory.  Has of HTN Doing well today .   Has a tens unit implanted in her hip   No CP or dyspnea   February 24, 2017:  Tamara Gregory is seen today with her husband Tamara Gregory her 89th birthday recently .  Hx of hyperlipidemia , HTN,  No cardiac issues.  Had some chest pain /  Indigestion. Resolved when she dranks some soda   Dec. 19, 2018:  Has had some orthostasis .  Has fallen severl times.  BP has been low Not eating as much  Is on losartan - HCT     Past Medical History:  Diagnosis Date  . Arthritis   . Cancer (HCC)    hx of skin cancer   . Depression   . GERD (gastroesophageal reflux disease)   . Headache(784.0)   . History of migraines   . Hyperlipidemia   . Hypertension   . Macular degeneration   . Myasthenia gravis ([place])   . Spinal stenosis   . Thoracic aneurysm    4.4cm; see CT done 01/28/12 and 01/22/13 in EPIC.   [name] Uses  hearing aid   . Vitamin D deficiency     Past Surgical History:  Procedure Laterality Date  . ABDOMINAL HYSTERECTOMY    . CHOLECYSTECTOMY    . LUMBAR LAMINECTOMY/DECOMPRESSION MICRODISCECTOMY N/A 02/04/2013   Procedure: CENTRAL DECOMPRESSION/LUMBAR LAMINECTOMY L3-L4 AND L4-L5 2 LEVELS;  Surgeon: Tamara Bastos, MD;  Location: [facility];  Service: Orthopedics;  Laterality: N/A;  . SPINAL CORD STIMULATOR IMPLANT     in right hip but not currently using because it did not help  . TONSILLECTOMY    . US ECHOCARDIOGRAPHY  05/18/2007   EF 55-60%     Medications: Current Outpatient Medications  Medication Sig Dispense Refill  . Calcium Carbonate (CALCIUM 500 PO) Take 1 capsule by mouth daily.     . cholecalciferol (VITAMIN D) 1000 UNITS tablet  Take 2,000 Units by mouth daily.     . DULoxetine (CYMBALTA) 60 MG capsule Take 1 capsule (60 mg total) by mouth daily. 90 capsule 1  . folic acid (FOLVITE) 846 MCG tablet Take 400 mcg by mouth daily.    . Glucosamine-Chondroit-Vit C-Mn (GLUCOSAMINE 1500 COMPLEX PO) Take 1 tablet by mouth daily.     [name] latanoprost (XALATAN) 0.005 % ophthalmic solution Place 1 drop into the left eye at bedtime.  1  . losartan-hydrochlorothiazide (HYZAAR) 100-25 MG tablet TAKE 1 TABLET EVERY DAY BEFORE BREAKFAST 90 tablet 2  . mirabegron ER (MYRBETRIQ) 25 MG TB24 tablet Take 1 tablet (25 mg total) by mouth daily. 90 tablet 1  . Multiple Vitamins-Minerals (PRESERVISION AREDS) CAPS Take 2 capsules by mouth daily.    . Omega-3 Fatty Acids (FISH OIL) 435 MG CAPS Take 1 capsule by mouth daily.    [name] omeprazole (PRILOSEC) 40 MG capsule     . predniSONE (DELTASONE) 5 MG tablet TAKE 1 TABLET EVERY DAY WITH BREAKFAST (SUBSTITUTED FOR  DELTASONE) 90 tablet 3  . rosuvastatin (CRESTOR) 5 MG tablet TAKE 1 TABLET EVERY DAY 90 tablet 1  . traZODone (DESYREL) 50 MG tablet TAKE 1/2 TO 1 TABLET (25-50 MG TOTAL) BY MOUTH AT BEDTIME AS NEEDED FOR SLEEP. 90 tablet 1   No current facility-administered medications for this visit.     Allergies: No Known Allergies  Social History: The patient  reports that  has never smoked. she has never used smokeless tobacco. She reports that she does not drink alcohol or use drugs.   Family History: The patient's family history includes Cancer in her brother, sister, sister, and sister; Heart attack in her father.   Review of Systems: Please see the history of present illness.   Otherwise, the review of systems is positive for none.   All other systems are reviewed and negative.   Physical Exam: Blood pressure 102/66, pulse 84, height 5\' 5"  (1.651 m), weight 151 lb 1.9 oz (68.5 kg), SpO2 94 %.  GEN:  Well nourished, well developed in no acute distress HEENT: Normal NECK: No JVD; No carotid  bruits LYMPHATICS: No lymphadenopathyno  CARDIAC: RR,  9-6/2 systolic murmur , rubs, gallops RESPIRATORY:  Clear to auscultation without rales, wheezing or rhonchi  ABDOMEN: Soft, non-tender, non-distended MUSCULOSKELETAL:  No edema; No deformity  SKIN: Warm and dry NEUROLOGIC:  Alert and oriented x 3   LABORATORY DATA:  EKG: November 19, 2017: Normal sinus rhythm at 81.  She has premature atrial contractions.  Lab Results  Component Value Date   WBC 8.7 11/19/2017   HGB 9.8 (L) 11/19/2017   HCT 31.3 (L) 11/19/2017   PLT 347.0  11/19/2017   GLUCOSE 113 (H) 11/19/2017   CHOL 157 11/19/2017   TRIG 142.0 11/19/2017   HDL 67.20 11/19/2017   LDLDIRECT 139.3 10/04/2013   LDLCALC 61 11/19/2017   ALT 15 11/19/2017   AST 16 11/19/2017   NA 140 11/19/2017   K 3.4 (L) 11/19/2017   CL 103 11/19/2017   CREATININE 0.79 11/19/2017   BUN 17 11/19/2017   CO2 26 11/19/2017   TSH 3.53 11/19/2017   INR 1.01 01/21/2013   HGBA1C 6.3 11/19/2017    BNP (last 3 results) No results for input(s): BNP in the last 8760 hours.  ProBNP (last 3 results) No results for input(s): PROBNP in the last 8760 hours.   Other Studies Reviewed Today:  Echo Study Conclusions from 2014  - Left ventricle: The cavity size was normal. Wall thickness was increased in a pattern of moderate LVH. There was mild focal basal hypertrophy of the septum. Systolic function was normal. The estimated ejection fraction was in the range of 55% to 60%. Wall motion was normal; there were no regional wall motion abnormalities. Doppler parameters are consistent with abnormal left ventricular relaxation (grade 1 diastolic dysfunction). - Aortic valve: Valve mobility was restricted. There was mild stenosis. Mild regurgitation. Valve area: 1.6cm^2(VTI). Valve area: 1.48cm^2 (Vmax).  CT Angio CHEST IMPRESSION FROM 01/2013: Unchanged thoracic aneurysm with maximal measurement 44 mm. Severe atherosclerosis  with ulcerated plaque along the arch but no acute abnormalities.    Assessment/Plan: 1. Essential hypertension:    Her blood pressure is actually on the low side today.  We will decrease the low Satan losartan HCTZ to 50 mg / 12.5 mg a day.  2. Hypercholesterolemia -  Labs were drawn today at primary md office   3. Myasthenia gravis followed at [facility]  4. Past history of spinal stenosis with chronic low back pain. Implantation of spinal cord stimulator at [facility] on 01/27/14 - seeing provider at [facility] today.   5.  Frequent falls and poor balance  6. History of fusiform aneurysm of ascending aorta 4 cm -   Current medicines are reviewed with the patient today.  The patient does not have concerns regarding medicines other than what has been noted above.  The following changes have been made:  See above.  Labs/ tests ordered today include:    No orders of the defined types were placed in this encounter.    Mertie Moores, MD  11/19/2017 3:48 PM    [address],  [address] [place], [place]  [address] Pager [phone] Phone: [phone]; Fax: [phone]

## 2017-11-19 NOTE — Patient Instructions (Signed)
Medication Instructions:  1. DECREASE LOSARTAN-HCTZ 50-12.5 MG DAILY  Labwork: NONE ORDERED TODAY  Testing/Procedures: NONE ORDERED TODAY  Follow-Up: Your physician wants you to follow-up in: [place] DR. Acie Fredrickson  You will receive a reminder letter in the mail two months in advance. If you don't receive a letter, please call our office to schedule the follow-up appointment.   Any Other Special Instructions Will Be Listed Below (If Applicable).     If you need a refill on your cardiac medications before your next appointment, please call your pharmacy.

## 2017-11-19 NOTE — Assessment & Plan Note (Signed)
Not controlled - mostly related to her chronic back pain Increase cymbalta to 60 mg daily

## 2017-11-19 NOTE — Assessment & Plan Note (Signed)
GERD controlled Continue daily medication  

## 2017-11-19 NOTE — Progress Notes (Signed)
Subjective:   Tamara Gregory is a 83 y.o. female who presents for an Initial Medicare Annual Wellness Visit.  Review of Systems    No ROS.  Medicare Wellness Visit. Additional risk factors are reflected in the social history.  Cardiac Risk Factors include: advanced age (>91men, >75 women);hypertension Sleep patterns: has interrupted sleep, gets up 1-2 times nightly to void and sleeps 6 hours nightly.   Home Safety/Smoke Alarms: Feels safe in home. Smoke alarms in place. Lives with husband, no needs for DME, good support system  Living environment; residence and Firearm Safety: 2-story house, equipment: Walkers, Type: Conservation officer, nature, Hydrologist, Type: Tub Surveyor, quantity and wheelchair, no firearms. Seat Belt Safety/Bike Helmet: Wears seat belt.     Objective:    Today's Vitals   11/19/17 1137 11/19/17 1241  BP: 124/72   Pulse: (!) 58   Resp: 16   Temp: 98.3 F (36.8 C)   TempSrc: Oral   SpO2: 94%   Weight: 151 lb (68.5 kg)   PainSc:  4    Body mass index is 25.13 kg/m.  Advanced Directives 11/19/2017 03/28/2017 11/01/2014 10/17/2014 02/04/2013 01/26/2013 01/26/2013  Does Patient Have a Medical Advance Directive? Yes Yes - Yes Patient has advance directive, copy not in chart Patient has advance directive, copy not in chart -  Type of Advance Directive [place];Living will [place];Living will [place];Living will [place];Living will Living will [place] in Chart? No - copy requested - No - copy requested No - copy requested Copy requested from family Copy requested from family -  Pre-existing out of facility DNR order (yellow form or pink MOST form) - - - - No - No    Current Medications (verified) Outpatient Encounter Medications as of 11/19/2017  Medication Sig  . Calcium Carbonate (CALCIUM 500 PO) Take 1 capsule by mouth daily.   .  cholecalciferol (VITAMIN D) 1000 UNITS tablet Take 2,000 Units by mouth daily.   . folic acid (FOLVITE) 539 MCG tablet Take 400 mcg by mouth daily.  . Glucosamine-Chondroit-Vit C-Mn (GLUCOSAMINE 1500 COMPLEX PO) Take 1 tablet by mouth daily.   [name] latanoprost (XALATAN) 0.005 % ophthalmic solution Place 1 drop into the left eye at bedtime.  [name] losartan-hydrochlorothiazide (HYZAAR) 100-25 MG tablet TAKE 1 TABLET EVERY DAY BEFORE BREAKFAST  . mirabegron ER (MYRBETRIQ) 25 MG TB24 tablet Take 1 tablet (25 mg total) by mouth daily.  . Multiple Vitamins-Minerals (PRESERVISION AREDS) CAPS Take 2 capsules by mouth daily.  . Omega-3 Fatty Acids (FISH OIL) 435 MG CAPS Take 1 capsule by mouth daily.  [name] omeprazole (PRILOSEC) 40 MG capsule   . predniSONE (DELTASONE) 5 MG tablet TAKE 1 TABLET EVERY DAY WITH BREAKFAST (SUBSTITUTED FOR  DELTASONE)  . rosuvastatin (CRESTOR) 5 MG tablet TAKE 1 TABLET EVERY DAY  . traZODone (DESYREL) 50 MG tablet TAKE 1/2 TO 1 TABLET (25-50 MG TOTAL) BY MOUTH AT BEDTIME AS NEEDED FOR SLEEP.  . [DISCONTINUED] DULoxetine (CYMBALTA) 20 MG capsule Take 2 capsules (40 mg total) by mouth daily.  . DULoxetine (CYMBALTA) 60 MG capsule Take 1 capsule (60 mg total) by mouth daily.  . [DISCONTINUED] amLODipine (NORVASC) 5 MG tablet Take 1/2 tablet by mouth daily  . [DISCONTINUED] predniSONE (DELTASONE) 5 MG tablet Take 1 tablet (5 mg total) by mouth daily with breakfast.  . [DISCONTINUED] ranitidine (ZANTAC) 150 MG tablet TAKE 1 TABLET (150  MG TOTAL) BY MOUTH AT BEDTIME. (Patient not taking: Reported on 09/11/2017)   No facility-administered encounter medications on file as of 11/19/2017.     Allergies (verified) Patient has no known allergies.   History: Past Medical History:  Diagnosis Date  . Arthritis   . Cancer (HCC)    hx of skin cancer   . Depression   . GERD (gastroesophageal reflux disease)   . Headache(784.0)   . History of migraines   . Hyperlipidemia   . Hypertension     . Macular degeneration   . Myasthenia gravis ([place])   . Spinal stenosis   . Thoracic aneurysm    4.4cm; see CT done 01/28/12 and 01/22/13 in [facility].   [name] Uses hearing aid   . Vitamin D deficiency    Past Surgical History:  Procedure Laterality Date  . ABDOMINAL HYSTERECTOMY    . CHOLECYSTECTOMY    . LUMBAR LAMINECTOMY/DECOMPRESSION MICRODISCECTOMY N/A 02/04/2013   Procedure: CENTRAL DECOMPRESSION/LUMBAR LAMINECTOMY L3-L4 AND L4-L5 2 LEVELS;  Surgeon: Tobi Bastos, MD;  Location: [facility];  Service: Orthopedics;  Laterality: N/A;  . SPINAL CORD STIMULATOR IMPLANT     in right hip but not currently using because it did not help  . TONSILLECTOMY    . US ECHOCARDIOGRAPHY  05/18/2007   EF 55-60%   Family History  Problem Relation Age of Onset  . Heart attack Father   . Cancer Sister   . Cancer Brother   . Cancer Sister   . Cancer Sister   . Colon polyps Neg Hx   . Colon cancer Neg Hx   . Esophageal cancer Neg Hx   . Rectal cancer Neg Hx   . Stomach cancer Neg Hx   . Adrenal disorder Neg Hx    Social History   Socioeconomic History  . Marital status: Married    Spouse name: None  . Number of children: None  . Years of education: None  . Highest education level: None  Social Needs  . Financial resource strain: None  . Food insecurity - worry: None  . Food insecurity - inability: None  . Transportation needs - medical: None  . Transportation needs - non-medical: None  Occupational History  . None  Tobacco Use  . Smoking status: Never Smoker  . Smokeless tobacco: Never Used  Substance and Sexual Activity  . Alcohol use: No  . Drug use: No  . Sexual activity: None  Other Topics Concern  . None  Social History Narrative   Lives with husband in a one story home.  Has 2 sons.  Retired.  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  Education: college.  [employer].    Tobacco Counseling Counseling given: Not Answered  Activities of Daily Living In  your present state of health, do you have any difficulty performing the following activities: 11/19/2017  Hearing? Y  Vision? N  Difficulty concentrating or making decisions? Y  Walking or climbing stairs? Y  Dressing or bathing? Y  Doing errands, shopping? Y  Preparing Food and eating ? Y  Using the Toilet? N  In the past six months, have you accidently leaked urine? Y  Do you have problems with loss of bowel control? Y  Managing your Medications? Y  Managing your Finances? Y  Housekeeping or managing your Housekeeping? Y  Some recent data might be hidden     Immunizations and Health Maintenance Immunization History  Administered Date(s) Administered  . Influenza-Unspecified 08/12/2013,  08/19/2015, 08/14/2016  . Pneumococcal Polysaccharide-23 08/12/2013  . Zoster 08/19/2015   Health Maintenance Due  Topic Date Due  . TETANUS/TDAP  01/20/1944  . PNA vac Low Risk Adult (2 of 2 - PCV13) 08/12/2014  . INFLUENZA VACCINE  07/02/2017    Patient Care Team: Binnie Rail, MD as PCP - General (Internal Medicine)  Indicate any recent Medical Services you may have received from other than [facility] providers in the past year (date may be approximate).     Assessment:   This is a routine wellness examination for [place]. Physical assessment deferred to PCP.   Hearing/Vision screen Hearing Screening Comments: Reports HOH but is at a baseline and declines audiology referral.  Dietary issues and exercise activities discussed: Current Exercise Habits: The patient does not participate in regular exercise at present, Exercise limited by: orthopedic condition(s)  Diet (meal preparation, eat out, water intake, caffeinated beverages, dairy products, fruits and vegetables): in general, a "healthy" diet  , well balanced   Discussed drinking nutritional supplements, encouraged patient to increase daily water intake.   Goals    None     Depression Screen PHQ 2/9 Scores 11/19/2017 03/19/2017  05/06/2016  PHQ - 2 Score 5 5 0  PHQ- 9 Score 12 10 -    Fall Risk Fall Risk  11/19/2017 11/06/2016 05/06/2016  Falls in the past year? Yes No Yes  Number falls in past yr: 1 - 2 or more  Injury with Fall? No - Yes  Risk Factor Category  - - High Fall Risk  Risk for fall due to : - - Impaired balance/gait  Follow up Falls prevention discussed - -   Cognitive Function: MMSE - Mini Mental State Exam 11/19/2017  Not completed: Refused        Screening Tests Health Maintenance  Topic Date Due  . TETANUS/TDAP  01/20/1944  . PNA vac Low Risk Adult (2 of 2 - PCV13) 08/12/2014  . INFLUENZA VACCINE  07/02/2017  . DEXA SCAN  08/21/2018     Plan:    Continue doing brain stimulating activities (puzzles, reading, adult coloring books, staying active) to keep memory sharp.   Continue to eat heart healthy diet (full of fruits, vegetables, whole grains, lean protein, water--limit salt, fat, and sugar intake) and increase physical activity as tolerated.  I have personally reviewed and noted the following in the patient's chart:   . Medical and social history . Use of alcohol, tobacco or illicit drugs  . Current medications and supplements . Functional ability and status . Nutritional status . Physical activity . Advanced directives . List of other physicians . Vitals . Screenings to include cognitive, depression, and falls . Referrals and appointments  In addition, I have reviewed and discussed with patient certain preventive protocols, quality metrics, and best practice recommendations. A written personalized care plan for preventive services as well as general preventive health recommendations were provided to patient.     Michiel Cowboy, RN   11/19/2017    Medical screening examination/treatment/procedure(s) were performed by non-physician practitioner and as supervising physician I was immediately available for consultation/collaboration. I agree with above. Binnie Rail,  MD

## 2017-11-20 ENCOUNTER — Ambulatory Visit (INDEPENDENT_AMBULATORY_CARE_PROVIDER_SITE_OTHER): Payer: Medicare Other | Admitting: Neurology

## 2017-11-20 ENCOUNTER — Encounter: Payer: Self-pay | Admitting: Neurology

## 2017-11-20 VITALS — BP 96/58 | HR 112 | Ht 62.0 in | Wt 153.0 lb

## 2017-11-20 DIAGNOSIS — G7000 Myasthenia gravis without (acute) exacerbation: Secondary | ICD-10-CM

## 2017-11-20 MED ORDER — PREDNISONE 1 MG PO TABS: ORAL_TABLET | ORAL | 3 refills | Status: DC

## 2017-11-20 NOTE — Patient Instructions (Addendum)
Week 1-2:  Take alternating days of 5mg  and 4mg  Week 3-6:  Take 4mg  daily Week 7-8:  Take 4mg  and alternate with 3mg   Continue:  Then 3mg  daily  Return to clinic on March 19th at 3:30pm

## 2017-11-20 NOTE — Progress Notes (Signed)
Follow-up Visit   Date: 11/20/17    Tamara Gregory MRN: [id number] DOB: 1925/11/28   Interim History: Tamara Gregory is a 83 y.o. Caucasian female with hypertension, depression, GERD, hyperlipidemia, and chronic low back pain s/p spinal cord stimulator returning to the clinic for follow-up of ocular myasthenia.  The patient was accompanied to the clinic by husband who also provides collateral information.    History of present illness: She was diagnosed with myasthenia in 2013 by Dr. Vallarie Mare at  [facility] by AChR antibody testing. CT chest was negative.  Symptoms manifested with R > L ptosis.  She did not have any double vision, difficulty swallowing/talking, shortness of breath, or limb weakness.  She did not benefit with mestinon and discontinued this due to symptoms of malaise.  Prednisone 20mg  was started and slowly tapered to 5mg  in December 2014.  She has been on this dose for the past 3 years and reports having no exacerbations with MG.  She continues to have mild ptosis bilaterally, but it does not bother her.  Again, she denies double vision.   UPDATE 11/20/2017:  She is here for 1 year follow-up visit.  No new complaints with respect to her ptosis.  She continues to take prednisone 5mg  daily and denies any double vision, difficulty swallowing, talking, limb weakness.  She is planning on going to her home in [place] during the winter months.  Fortunately, she has remained well without any interval falls or hospitalizations.  Medications:  Current Outpatient Medications on File Prior to Visit  Medication Sig Dispense Refill  . Calcium Carbonate (CALCIUM 500 PO) Take 1 capsule by mouth daily.     . cholecalciferol (VITAMIN D) 1000 UNITS tablet Take 2,000 Units by mouth daily.     . DULoxetine (CYMBALTA) 60 MG capsule Take 1 capsule (60 mg total) by mouth daily. 90 capsule 1  . folic acid (FOLVITE) 211 MCG tablet Take 400 mcg by mouth daily.    . Glucosamine-Chondroit-Vit  C-Mn (GLUCOSAMINE 1500 COMPLEX PO) Take 1 tablet by mouth daily.     [name] latanoprost (XALATAN) 0.005 % ophthalmic solution Place 1 drop into the left eye at bedtime.  1  . losartan-hydrochlorothiazide (HYZAAR) 50-12.5 MG tablet Take 1 tablet by mouth daily. 90 tablet 3  . mirabegron ER (MYRBETRIQ) 25 MG TB24 tablet Take 1 tablet (25 mg total) by mouth daily. 90 tablet 1  . Multiple Vitamins-Minerals (PRESERVISION AREDS) CAPS Take 2 capsules by mouth daily.    . Omega-3 Fatty Acids (FISH OIL) 435 MG CAPS Take 1 capsule by mouth daily.    [name] omeprazole (PRILOSEC) 40 MG capsule     . predniSONE (DELTASONE) 5 MG tablet TAKE 1 TABLET EVERY DAY WITH BREAKFAST (SUBSTITUTED FOR  DELTASONE) 90 tablet 3  . rosuvastatin (CRESTOR) 5 MG tablet TAKE 1 TABLET EVERY DAY 90 tablet 1  . traZODone (DESYREL) 50 MG tablet TAKE 1/2 TO 1 TABLET (25-50 MG TOTAL) BY MOUTH AT BEDTIME AS NEEDED FOR SLEEP. 90 tablet 1   No current facility-administered medications on file prior to visit.     Allergies: No Known Allergies  Review of Systems:  CONSTITUTIONAL: No fevers, chills, night sweats, or weight loss.  EYES: No visual changes or eye pain ENT: No hearing changes.  No history of nose bleeds.   RESPIRATORY: No cough, wheezing and shortness of breath.   CARDIOVASCULAR: Negative for chest pain, and palpitations.   GI: Negative for abdominal discomfort, blood in stools or  black stools.  No recent change in bowel habits.   GU:  No history of incontinence.   MUSCLOSKELETAL: No history of joint pain or swelling.  No myalgias.   SKIN: Negative for lesions, rash, and itching.   ENDOCRINE: Negative for cold or heat intolerance, polydipsia or goiter.   PSYCH:  No depression or anxiety symptoms.   NEURO: As Above.   Vital Signs:  BP (!) 96/58   Pulse (!) 112   Ht 5\' 2"  (1.575 m)   Wt 153 lb (69.4 kg)   SpO2 94%   BMI 27.98 kg/m   General: Well appearing, comfortable, in great health for her age  Neurological  Exam: MENTAL STATUS including orientation to time, place, person, recent and remote memory, attention span and concentration, language, and fund of knowledge is normal.  Speech is not dysarthric.  CRANIAL NERVES:   Pupils equal round and reactive to light.  Normal conjugate, extra-ocular eye movements in all directions of gaze.  Mild R > L ptosis, no worsening with sustained upgaze. Facial muscles are 5/5.   Face is symmetric. Palate elevates symmetrically.  Tongue is midline.  MOTOR:  Motor strength is 5/5 in all extremities, no fatigability.  No pronator drift.  Tone is normal.    COORDINATION/GAIT:  Stooped posture, unsteady gait, walks by holding onto husband  Data: Labs 01/2012:  + ACh R antibody at .81 (low range of + >.3)  IMPRESSION/PLAN: Seropositive ocular myasthenia gravis, diagnosed 2013 at [facility] by Dr. Pauline Aus.  She has been clinically stable and has never had an exacerbation.  - The highest dose of prednisone was 20mg  when initially diagnosed and then tapered to 5mg  which she has been on since 2014  - Clinically, she has mild R > L ptosis, no other bulbar weakness      - Again, I offered to slowly reduce her prednisone as there is only minimal ptosis, patient is agreeable     - Recommend very slow taper of 1mg  every 2-4 weeks, starting with alternating days of 5mg  and 4mg  for 2 weeks, then 4mg  daily x 4 weeks, then alternating 3mg  and 4mg  x 2 weeks, then continue 3mg     - Patient educated on worsening signs of myasthenia (double vision, ptosis, weakness, slurred speech, etc) and to contact my office, if these develop  Gait abnormality, encouraged compliance with her walker   Return to clinic in 2-3 months  The duration of this appointment visit was 30 minutes of face-to-face time with the patient.  Greater than 50% of this time was spent in counseling, explanation of diagnosis, planning of further management, and coordination of care.   Thank you for allowing me to  participate in patient's care.  If I can answer any additional questions, I would be pleased to do so.    Sincerely,    Donika K. Posey Pronto, DO

## 2017-11-23 ENCOUNTER — Other Ambulatory Visit: Payer: Self-pay | Admitting: Internal Medicine

## 2017-11-23 DIAGNOSIS — D649 Anemia, unspecified: Secondary | ICD-10-CM

## 2017-12-03 ENCOUNTER — Ambulatory Visit: Payer: Self-pay | Admitting: Hematology

## 2017-12-03 ENCOUNTER — Telehealth: Payer: Self-pay | Admitting: Internal Medicine

## 2017-12-03 NOTE — Telephone Encounter (Signed)
Copied from [place] #[id number]. Topic: Quick Communication - See Telephone Encounter >> Dec 03, 2017  8:58 AM Ahmed Prima L wrote: CRM for notification. See Telephone encounter for:   12/03/17.  Pt's husband called & stated that she has had diarrhea for 8 days. He would like to know if something could be called in for her or what he needs to do Pharmacy is [place]. Please call him either way & let him know. Tried to call [facility] triage, line was busy.

## 2017-12-03 NOTE — Telephone Encounter (Signed)
Patient states she is having several loose stools a day usually after she eats.  Patient describes the stool as loose, but not watery.  No other symptoms.  Home care instructions provided. Per patient, today seems to be improving.  Patient advised if symptoms worsen to call back.   Reason for Disposition . MILD-MODERATE diarrhea (e.g., 1-6 times / day more than normal)  Answer Assessment - Initial Assessment Questions 1. DIARRHEA SEVERITY: "How bad is the diarrhea?" "How many extra stools have you had in the past 24 hours than normal?"    - MILD: Few loose or mushy BMs; increase of 1-3 stools over normal daily number of stools; mild increase in ostomy output.   - MODERATE: Increase of 4-6 stools daily over normal; moderate increase in ostomy output.   - SEVERE (or Worst Possible): Increase of 7 or more stools daily over normal; moderate increase in ostomy output; incontinence.   3 per day all day long 2. ONSET: "When did the diarrhea begin?"    8 days ago 3. BM CONSISTENCY: "How loose or watery is the diarrhea?"    Loose 4. VOMITING: "Are you also vomiting?" If so, ask: "How many times in the past 24 hours?"      no  7. ORAL INTAKE: If vomiting, "Have you been able to drink liquids?" "How much fluids have you had in the past 24 hours?"     yes 8. HYDRATION: "Any signs of dehydration?" (e.g., dry mouth [not just dry lips], too weak to stand, dizziness, new weight loss) "When did you last urinate?"     no 9. EXPOSURE: "Have you traveled to a foreign country recently?" "Have you been exposed to anyone with diarrhea?" "Could you have eaten any food that was spoiled?"    No 10. OTHER SYMPTOMS: "Do you have any other symptoms?" (e.g., fever, blood in stool)     No other symptoms  Protocols used: DIARRHEA-A-AH

## 2017-12-03 NOTE — Telephone Encounter (Signed)
See triage note.

## 2017-12-23 ENCOUNTER — Ambulatory Visit (INDEPENDENT_AMBULATORY_CARE_PROVIDER_SITE_OTHER): Payer: Medicare Other | Admitting: Internal Medicine

## 2017-12-23 ENCOUNTER — Other Ambulatory Visit (INDEPENDENT_AMBULATORY_CARE_PROVIDER_SITE_OTHER): Payer: Medicare Other

## 2017-12-23 ENCOUNTER — Encounter: Payer: Self-pay | Admitting: Internal Medicine

## 2017-12-23 VITALS — BP 136/82 | HR 101 | Temp 98.0°F | Resp 16 | Wt 152.0 lb

## 2017-12-23 DIAGNOSIS — D649 Anemia, unspecified: Secondary | ICD-10-CM

## 2017-12-23 DIAGNOSIS — F3289 Other specified depressive episodes: Secondary | ICD-10-CM

## 2017-12-23 DIAGNOSIS — R197 Diarrhea, unspecified: Secondary | ICD-10-CM | POA: Insufficient documentation

## 2017-12-23 LAB — CBC WITH DIFFERENTIAL/PLATELET
BASOS PCT: 0.8 % (ref 0.0–3.0)
Basophils Absolute: 0.1 10*3/uL (ref 0.0–0.1)
EOS PCT: 0.8 % (ref 0.0–5.0)
Eosinophils Absolute: 0.1 10*3/uL (ref 0.0–0.7)
HCT: 30.2 % — ABNORMAL LOW (ref 36.0–46.0)
Hemoglobin: 9.3 g/dL — ABNORMAL LOW (ref 12.0–15.0)
LYMPHS ABS: 1.6 10*3/uL (ref 0.7–4.0)
Lymphocytes Relative: 14.1 % (ref 12.0–46.0)
MCHC: 30.7 g/dL (ref 30.0–36.0)
MCV: 80.7 fl (ref 78.0–100.0)
MONO ABS: 0.6 10*3/uL (ref 0.1–1.0)
Monocytes Relative: 5.5 % (ref 3.0–12.0)
NEUTROS PCT: 78.8 % — AB (ref 43.0–77.0)
Neutro Abs: 9.1 10*3/uL — ABNORMAL HIGH (ref 1.4–7.7)
Platelets: 328 10*3/uL (ref 150.0–400.0)
RBC: 3.74 Mil/uL — ABNORMAL LOW (ref 3.87–5.11)
RDW: 17 % — AB (ref 11.5–15.5)
WBC: 11.5 10*3/uL — ABNORMAL HIGH (ref 4.0–10.5)

## 2017-12-23 LAB — IRON: IRON: 39 ug/dL — AB (ref 42–145)

## 2017-12-23 LAB — FERRITIN: Ferritin: 8.1 ng/mL — ABNORMAL LOW (ref 10.0–291.0)

## 2017-12-23 MED ORDER — SERTRALINE HCL 50 MG PO TABS: 50.0000 mg | ORAL_TABLET | Freq: Every day | ORAL | 3 refills | Status: DC

## 2017-12-23 NOTE — Progress Notes (Signed)
Subjective:    Patient ID: Tamara Gregory, female    DOB: [birth year], 84 y.o.   MRN: [id number]  HPI She is here for an acute visit.    Diarrhea:  It started at least 3 weeks ago, possibly longer.  She has three watery BM a day.  She denies abdominal pain, GERD, blood in the stool.  She has intermittent nausea.  She denies any normal stools since the diarrhea started, but recently has seen occ small amounts of formed stool in with the diarrhea.  She is having incontinence with her stools.  She has had bladder incontinence for a while, but never stool incontinence.    No recent antibiotics. No change in diet. No travel.  No other obvious causes.    BP medications have been decreased.   We increased her cymbalta dose and her Prednisone dose is being slowly decreased, current  on 4 mg.  Both of these last two changes occurred about 4 weeks ago.    Depression:  We increased the duloxetine one month ago.  She is currently taking 60 mg daily.  She denies any improvement in her depression.  Her depression is not controlled.  She is depressed about how she is feeling.    Medications and allergies reviewed with patient and updated if appropriate.  Patient Active Problem List   Diagnosis Date Noted  . Large hiatal hernia 04/01/2017  . Sweating profusely 03/19/2017  . Hyperglycemia 03/19/2017  . Squamous cell skin cancer 11/05/2016  . Nosebleed 10/19/2016  . Osteopenia 08/28/2016  . Essential hypertension, benign 08/21/2016  . Long term current use of systemic steroids 08/21/2016  . GERD (gastroesophageal reflux disease) 05/06/2016  . Mixed incontinence urge and stress 05/06/2016  . Low hemoglobin 09/22/2014  . Insomnia 07/26/2014  . Malaise and fatigue 10/04/2013  . PAC (premature atrial contraction) 10/04/2013  . Depression (emotion) 02/15/2013  . Spinal stenosis, lumbar region, with neurogenic claudication 02/04/2013  . Myasthenia gravis ([place]) 03/17/2012  . Pure hypercholesterolemia  09/05/2011  . Benign hypertensive heart disease without heart failure 09/05/2011  . Osteoarthritis 09/05/2011    Current Outpatient Medications on File Prior to Visit  Medication Sig Dispense Refill  . Calcium Carbonate (CALCIUM 500 PO) Take 1 capsule by mouth daily.     . cholecalciferol (VITAMIN D) 1000 UNITS tablet Take 2,000 Units by mouth daily.     . DULoxetine (CYMBALTA) 60 MG capsule Take 1 capsule (60 mg total) by mouth daily. 90 capsule 1  . folic acid (FOLVITE) 878 MCG tablet Take 400 mcg by mouth daily.    . Glucosamine-Chondroit-Vit C-Mn (GLUCOSAMINE 1500 COMPLEX PO) Take 1 tablet by mouth daily.     [name] latanoprost (XALATAN) 0.005 % ophthalmic solution Place 1 drop into the left eye at bedtime.  1  . losartan-hydrochlorothiazide (HYZAAR) 50-12.5 MG tablet Take 1 tablet by mouth daily. (Patient taking differently: Take 0.5 tablets by mouth daily. ) 90 tablet 3  . mirabegron ER (MYRBETRIQ) 25 MG TB24 tablet Take 1 tablet (25 mg total) by mouth daily. 90 tablet 1  . Multiple Vitamins-Minerals (PRESERVISION AREDS) CAPS Take 2 capsules by mouth daily.    . Omega-3 Fatty Acids (FISH OIL) 435 MG CAPS Take 1 capsule by mouth daily.    [name] omeprazole (PRILOSEC) 40 MG capsule     . predniSONE (DELTASONE) 1 MG tablet Take 4 tabl alternating with 5mg  tab x 2 weeks, then 4mg  daily x 1 month.  If ok, reduce to 3mg   alternating with 4mg  x 2 weeks, then 3mg /d 350 tablet 3  . predniSONE (DELTASONE) 5 MG tablet TAKE 1 TABLET EVERY DAY WITH BREAKFAST (SUBSTITUTED FOR  DELTASONE) 90 tablet 3  . rosuvastatin (CRESTOR) 5 MG tablet TAKE 1 TABLET EVERY DAY 90 tablet 1  . traZODone (DESYREL) 50 MG tablet TAKE 1/2 TO 1 TABLET (25-50 MG TOTAL) BY MOUTH AT BEDTIME AS NEEDED FOR SLEEP. 90 tablet 1   No current facility-administered medications on file prior to visit.     Past Medical History:  Diagnosis Date  . Arthritis   . Cancer (HCC)    hx of skin cancer   . Depression   . GERD (gastroesophageal  reflux disease)   . Headache(784.0)   . History of migraines   . Hyperlipidemia   . Hypertension   . Macular degeneration   . Myasthenia gravis ([place])   . Spinal stenosis   . Thoracic aneurysm    4.4cm; see CT done 01/28/12 and 01/22/13 in [facility].   [name] Uses hearing aid   . Vitamin D deficiency     Past Surgical History:  Procedure Laterality Date  . ABDOMINAL HYSTERECTOMY    . CHOLECYSTECTOMY    . LUMBAR LAMINECTOMY/DECOMPRESSION MICRODISCECTOMY N/A 02/04/2013   Procedure: CENTRAL DECOMPRESSION/LUMBAR LAMINECTOMY L3-L4 AND L4-L5 2 LEVELS;  Surgeon: Tobi Bastos, MD;  Location: [facility];  Service: Orthopedics;  Laterality: N/A;  . SPINAL CORD STIMULATOR IMPLANT     in right hip but not currently using because it did not help  . TONSILLECTOMY    . US ECHOCARDIOGRAPHY  05/18/2007   EF 55-60%    Social History   Socioeconomic History  . Marital status: Married    Spouse name: Not on file  . Number of children: Not on file  . Years of education: Not on file  . Highest education level: Not on file  Social Needs  . Financial resource strain: Not on file  . Food insecurity - worry: Not on file  . Food insecurity - inability: Not on file  . Transportation needs - medical: Not on file  . Transportation needs - non-medical: Not on file  Occupational History  . Not on file  Tobacco Use  . Smoking status: Never Smoker  . Smokeless tobacco: Never Used  Substance and Sexual Activity  . Alcohol use: No  . Drug use: No  . Sexual activity: Not on file  Other Topics Concern  . Not on file  Social History Narrative   Lives with husband in a one story home.  Has 2 sons.  Retired.  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  Education: college.  [employer].    Family History  Problem Relation Age of Onset  . Heart attack Father   . Cancer Sister   . Cancer Brother   . Cancer Sister   . Cancer Sister   . Colon polyps Neg Hx   . Colon cancer Neg Hx   . Esophageal  cancer Neg Hx   . Rectal cancer Neg Hx   . Stomach cancer Neg Hx   . Adrenal disorder Neg Hx     Review of Systems  Constitutional: Positive for appetite change (improved) and diaphoresis (sweats). Negative for chills and fever.  Respiratory: Positive for shortness of breath (chronic - unchanged).   Cardiovascular: Positive for chest pain (one episode one week ago). Negative for palpitations and leg swelling.  Gastrointestinal: Positive for diarrhea and nausea (intermittent). Negative for  abdominal pain and blood in stool.       No gerd  Genitourinary: Negative for dysuria.  Neurological: Positive for light-headedness and headaches.       Objective:   Vitals:   12/23/17 1449  BP: 136/82  Pulse: (!) 101  Resp: 16  Temp: 98 F (36.7 C)  SpO2: 96%   Wt Readings from Last 3 Encounters:  12/23/17 152 lb (68.9 kg)  11/20/17 153 lb (69.4 kg)  11/19/17 151 lb 1.9 oz (68.5 kg)   Body mass index is 27.8 kg/m.   Physical Exam    Constitutional: Appears well-developed and well-nourished. No distress.  HENT:  Head: Normocephalic and atraumatic.  Neck: Neck supple. No tracheal deviation present. No thyromegaly present.  No cervical lymphadenopathy Cardiovascular: Normal rate, regular rhythm and normal heart sounds.   2/6 systolic murmur heard. No carotid bruit .  No edema Pulmonary/Chest: Effort normal and breath sounds normal. No respiratory distress. No has no wheezes. No rales.  Abdomen: soft, non tender, non distended Skin: Skin is warm and dry. Not diaphoretic.  Psychiatric: Normal mood and affect. Behavior is normal.      Assessment & Plan:    See Problem List for Assessment and Plan of chronic medical problems.

## 2017-12-23 NOTE — Assessment & Plan Note (Signed)
Not controlled cymbalta not helping - ? Increase in dose 4 weeks ago causing diarrhea -- since it is not helping will taper off She is depressed and needs medication - will start sertraline 50 mg daily and titrate up Much of her depression is related to her poor medical condition, but hopefully the medication will help

## 2017-12-23 NOTE — Assessment & Plan Note (Signed)
3 weeks of watery diarrhea - about 3 episodes per day Intermittent nausea, No abd pain, fever, blood ? Related to medication Taper off Cymbalta - it is not helping and diarrhea started after increasing dose Less likely related to decreasing prednisone dose Stool studies Referred to GI

## 2017-12-23 NOTE — Patient Instructions (Addendum)
Give a stool sample to rule out an infection.    Start duloxetine 20 mg daily for one week and then stop.    Start sertraline 50 mg nightly - start tomorrow night.     A referral was ordered for GI.    Call if the diarrhea does not improve over the next couple of weeks.

## 2017-12-24 ENCOUNTER — Other Ambulatory Visit: Payer: Medicare Other

## 2017-12-24 DIAGNOSIS — R197 Diarrhea, unspecified: Secondary | ICD-10-CM | POA: Diagnosis not present

## 2017-12-25 LAB — C. DIFFICILE GDH AND TOXIN A/B
GDH ANTIGEN: NOT DETECTED
MICRO NUMBER:: 90096424
SPECIMEN QUALITY:: ADEQUATE
TOXIN A AND B: NOT DETECTED

## 2017-12-25 LAB — FECAL LACTOFERRIN, QUANT
FECAL LACTOFERRIN: POSITIVE — AB
MICRO NUMBER:: 90096425
SPECIMEN QUALITY:: ADEQUATE

## 2017-12-28 LAB — STOOL CULTURE
MICRO NUMBER: 90096488
MICRO NUMBER: 90096489
MICRO NUMBER:: 90096487
SHIGA RESULT:: NOT DETECTED
SPECIMEN QUALITY: ADEQUATE
SPECIMEN QUALITY:: ADEQUATE
SPECIMEN QUALITY:: ADEQUATE

## 2018-01-01 ENCOUNTER — Ambulatory Visit: Payer: Medicare Other | Admitting: Nurse Practitioner

## 2018-01-06 ENCOUNTER — Encounter: Payer: Self-pay | Admitting: Nurse Practitioner

## 2018-01-06 ENCOUNTER — Ambulatory Visit (INDEPENDENT_AMBULATORY_CARE_PROVIDER_SITE_OTHER): Payer: Medicare Other | Admitting: Nurse Practitioner

## 2018-01-06 VITALS — BP 112/76 | HR 80 | Ht 64.0 in | Wt 148.0 lb

## 2018-01-06 DIAGNOSIS — R634 Abnormal weight loss: Secondary | ICD-10-CM | POA: Diagnosis not present

## 2018-01-06 DIAGNOSIS — D509 Iron deficiency anemia, unspecified: Secondary | ICD-10-CM | POA: Diagnosis not present

## 2018-01-06 DIAGNOSIS — R197 Diarrhea, unspecified: Secondary | ICD-10-CM | POA: Diagnosis not present

## 2018-01-06 DIAGNOSIS — D485 Neoplasm of uncertain behavior of skin: Secondary | ICD-10-CM | POA: Diagnosis not present

## 2018-01-06 DIAGNOSIS — C44629 Squamous cell carcinoma of skin of left upper limb, including shoulder: Secondary | ICD-10-CM | POA: Diagnosis not present

## 2018-01-06 DIAGNOSIS — D0462 Carcinoma in situ of skin of left upper limb, including shoulder: Secondary | ICD-10-CM | POA: Diagnosis not present

## 2018-01-06 MED ORDER — METRONIDAZOLE 500 MG PO TABS: 500.0000 mg | ORAL_TABLET | Freq: Three times a day (TID) | ORAL | 0 refills | Status: DC

## 2018-01-06 NOTE — Patient Instructions (Signed)
If you are age 82 or older, your body mass index should be between 23-30. Your Body mass index is 25.4 kg/m. If this is out of the aforementioned range listed, please consider follow up with your Primary Care Provider.  If you are age 64 or younger, your body mass index should be between 19-25. Your Body mass index is 25.4 kg/m. If this is out of the aformentioned range listed, please consider follow up with your Primary Care Provider.   We have sent the following medications to your pharmacy for you to pick up at your convenience: Flagyl 500 mg three times daily for 7 days.  Start Miralax 1 capful daily as needed.  (Over-the-counter)  Ferrous Sulfate 325 mg every day.  (Over-the-counter)  Call in 7-10 days with an update.  Thank you for choosing me and [facility].   Tye Savoy, NP

## 2018-01-06 NOTE — Progress Notes (Signed)
IMPRESSION and PLAN:    1. 84 yo female with one month hx of diarrhea. Stool culture and c-diff negative, stool lactoferrin positive when checked a couple of weeks ago. Just in last few days have stools become slightly more solid but still having loose stools as well. Probably was infectious diarrhea, would have expected resolution or at least significant improvement by now. -discussed options: give a little more time vrs short course of antibiotics. She would like a short course of antibiotics so will try 7 days of flagyl. She will call me with an update in 7-10 days.    2. Iron deficiency anemia. Hgb 11 in late April, down to 9.3 today. Ferritin 8.1. No overt bleeding and not taking NSAIDS. 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 She complains of fatigue.  -ferrous sulfate 325 mg daily cautioned about constipation -I don't know that repeat EGD will be helpful unless hgb continues to fall or she has overt Gi bleeding. Will discuss with Dr. Ardis Hughs to see how he feels about it.  -It has been years since last colonoscopy. Given advanced age and high index of suspicious that anemia is related to Cameron's lesions I don't think colonoscopy will be necessary at this point.  -continue PPI   3. Weight loss. Down ~ 5 pounds since late December   HPI:    Chief Complaint:  Diarrhea.   Patient is a 84 year old female known to Dr. Ardis Hughs. She was last seen in late April for a GI bleed in setting of Exedrin. EGD remarkable for large hiatal hernia with at one large friable Cameron's lesion. She hasn't taken NSAIDS since. No overt GI bleeding.   Patient is here with husband for evaluaton of diarrhea present for a month now. Husband provides much of the history. Prior to a month ago patient typically battled constipation for which she took Miralax every day. Once diarrhea started she stopped Miralax  Stools have been loose with excessive gas 3-4 times / day. No associated cramping  except sometimes after eating and that is not really new. There isn't any blood in the stool.  No nausea / vomiting.  She sweats a lot, especially in back of her head but no chills or fevers.  Just in last few days she has started having some more solid stools but even those are followed by liquid stools.  She hasn't had any antibiotics in last few months. PCP recently changed antidepressant to Zoloft but diarrhea preceded the change.  Cardiology reduced losarrtan dose,  no other medication changes. No dietary changes. She has been trying to eat bland foods to ease the diarrhea. We discussed overflow diarrhea but prior to onset of diarrhea her bowels were moving fine with miralax. She doubts this is overflow diarrhea. Stool culture and c-diff negative. Lactoferrin positive on 12/24/17.    GERD sx under control with daily omeprazole.   ROS:  No chest pain , SOB, or urinary sx.     Past Medical History:  Diagnosis Date  . Arthritis   . Cancer (HCC)    hx of skin cancer   . Depression   . GERD (gastroesophageal reflux disease)   . Headache(784.0)   . History of migraines   . Hyperlipidemia   . Hypertension   . Macular degeneration   . Myasthenia gravis ([place])   . Spinal stenosis   . Thoracic aneurysm    4.4cm; see CT done 01/28/12 and 01/22/13 in [facility].   [name]  Uses hearing aid   . Vitamin D deficiency     Patient's surgical history, family medical history, social history, medications and allergies were all reviewed in Epic     Physical Exam:     BP 112/76   Pulse 80   Ht 5\' 4"  (1.626 m)   Wt 148 lb (67.1 kg)   BMI 25.40 kg/m   GENERAL:  Well developed white female in NAD PSYCH: :Pleasant, cooperative, normal affect EENT:  conjunctiva pink, mucous membranes moist, neck supple without masses CARDIAC:  RRR, , trace BLE edema, brown discoloration of skin in both lower legs resembling venous stasis. [name]  PULM: Normal respiratory effort, lungs CTA bilaterally, no wheezing ABDOMEN:   Nondistended, soft, nontender. No obvious masses, no hepatomegaly,  normal bowel sounds SKIN:  turgor, no lesions seen Musculoskeletal:  Normal muscle tone, normal strength NEURO: Alert and oriented x 3, no focal neurologic deficits   Tye Savoy , NP 01/06/2018, 2:47 PM

## 2018-01-08 ENCOUNTER — Encounter: Payer: Self-pay | Admitting: Nurse Practitioner

## 2018-01-09 NOTE — Progress Notes (Signed)
I agree with the above note, plan 

## 2018-01-15 ENCOUNTER — Other Ambulatory Visit: Payer: Self-pay

## 2018-01-15 ENCOUNTER — Telehealth: Payer: Self-pay

## 2018-01-15 DIAGNOSIS — D649 Anemia, unspecified: Secondary | ICD-10-CM

## 2018-01-15 NOTE — Progress Notes (Signed)
cbc

## 2018-01-15 NOTE — Telephone Encounter (Signed)
-----   Message from Willia Craze, NP sent at 01/14/2018  4:54 PM EST ----- Eustaquio Maize, please call and see if antibiotics helped diarrhea. Also, please make sure she gets a cbc in 4 weeks if not already scheduled for one. 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 Ardis Hughs agrees). Thanks

## 2018-01-15 NOTE — Telephone Encounter (Signed)
Spoke with a female answering the phone. She states she is doing better. Thanks me for the call. CBC in 4 weeks.

## 2018-01-20 ENCOUNTER — Telehealth: Payer: Self-pay | Admitting: Nurse Practitioner

## 2018-01-20 NOTE — Telephone Encounter (Signed)
Patient husband calling to speak with nurse to update on patient symptoms.

## 2018-01-20 NOTE — Telephone Encounter (Signed)
Spouse and I had spoken on 01/15/18 at which time he states his wife was doing well. He calls today stating she has had diarrhea for 2 months. She takes OTC Imodium. She has "explosive" bowel movements. She completed a course of Flagyl.

## 2018-01-20 NOTE — Telephone Encounter (Signed)
So did she better with flagyl then relapse after completion of it ?   I hate to put her through a colonoscopy but it sounds like she may need one. She has known Cameron's lesions and given recent drop in hgb I will see if Ardis Hughs wants to repeat EGD with the colon. Forwarding this to him as well. Thanks.

## 2018-01-21 NOTE — Telephone Encounter (Signed)
I think she should have colonoscopy and upper endoscopy same setting for her diarrhea and iron deficiency anemia.  Nevin Bloodgood can you let her know, [facility] should be OK.  thanks

## 2018-01-21 NOTE — Telephone Encounter (Signed)
Spoke with Mr Holderman. He is in agreement with the plan for endo colon. She did not improve on antibiotics. He says she is discouraged and "washed out." First available appointment scheduled. 03/04/18 at 2:30 pm in [place]. Pre visit scheduled for 02/10/18 at 2 pm.

## 2018-01-23 NOTE — Telephone Encounter (Signed)
Tamara Gregory, in the interim please see if she if taking imodium. If so and doesn't work then can try lomotil a couple of times a day. Hold for a week prior to colonoscopy though . Thanks

## 2018-01-26 NOTE — Telephone Encounter (Signed)
[name] says she is and thank you for calling.

## 2018-02-12 DIAGNOSIS — M48062 Spinal stenosis, lumbar region with neurogenic claudication: Secondary | ICD-10-CM | POA: Diagnosis not present

## 2018-02-13 ENCOUNTER — Other Ambulatory Visit: Payer: Self-pay

## 2018-02-13 ENCOUNTER — Ambulatory Visit (AMBULATORY_SURGERY_CENTER): Payer: Self-pay | Admitting: *Deleted

## 2018-02-13 VITALS — Ht 64.0 in | Wt 142.0 lb

## 2018-02-13 DIAGNOSIS — D509 Iron deficiency anemia, unspecified: Secondary | ICD-10-CM

## 2018-02-13 DIAGNOSIS — R197 Diarrhea, unspecified: Secondary | ICD-10-CM

## 2018-02-13 MED ORDER — PEG 3350-KCL-NA BICARB-NACL 420 G PO SOLR: 4000.0000 mL | Freq: Once | ORAL | 0 refills | Status: AC

## 2018-02-13 NOTE — Progress Notes (Signed)
No egg or soy allergy known to patient  No issues with past sedation with any surgeries  or procedures, no intubation problems  No diet pills per patient No home 02 use per patient  No blood thinners per patient  Pt denies issues with constipation  No A fib or A flutter  EMMI video sent to pt's e mail - sent egd and colon videos per husbands request  Pt and husband in [place] today- all questions answered- encouraged to call with questions

## 2018-02-17 ENCOUNTER — Ambulatory Visit (INDEPENDENT_AMBULATORY_CARE_PROVIDER_SITE_OTHER): Payer: Medicare Other | Admitting: Neurology

## 2018-02-17 ENCOUNTER — Encounter: Payer: Self-pay | Admitting: Neurology

## 2018-02-17 VITALS — BP 140/80 | HR 102 | Ht 62.0 in | Wt 140.4 lb

## 2018-02-17 DIAGNOSIS — D1801 Hemangioma of skin and subcutaneous tissue: Secondary | ICD-10-CM | POA: Diagnosis not present

## 2018-02-17 DIAGNOSIS — L814 Other melanin hyperpigmentation: Secondary | ICD-10-CM | POA: Diagnosis not present

## 2018-02-17 DIAGNOSIS — L570 Actinic keratosis: Secondary | ICD-10-CM | POA: Diagnosis not present

## 2018-02-17 DIAGNOSIS — D225 Melanocytic nevi of trunk: Secondary | ICD-10-CM | POA: Diagnosis not present

## 2018-02-17 DIAGNOSIS — G7000 Myasthenia gravis without (acute) exacerbation: Secondary | ICD-10-CM | POA: Diagnosis not present

## 2018-02-17 DIAGNOSIS — L821 Other seborrheic keratosis: Secondary | ICD-10-CM | POA: Diagnosis not present

## 2018-02-17 DIAGNOSIS — C44629 Squamous cell carcinoma of skin of left upper limb, including shoulder: Secondary | ICD-10-CM | POA: Diagnosis not present

## 2018-02-17 DIAGNOSIS — Z85828 Personal history of other malignant neoplasm of skin: Secondary | ICD-10-CM | POA: Diagnosis not present

## 2018-02-17 MED ORDER — PREDNISONE 5 MG PO TABS: 5.0000 mg | ORAL_TABLET | Freq: Every day | ORAL | 3 refills | Status: DC

## 2018-02-17 NOTE — Patient Instructions (Addendum)
Increase prednisone to 5mg  daily  Return to clinic on April 5th at 8am

## 2018-02-17 NOTE — Progress Notes (Signed)
Follow-up Visit   Date: 02/17/18    Tamara Gregory MRN: [id number] DOB: 08/01/1925   Interim History: Tamara Gregory is a 84 y.o. Caucasian female with hypertension, depression, GERD, hyperlipidemia, and chronic low back pain s/p spinal cord stimulator returning to the clinic for follow-up of ocular myasthenia.  The patient was accompanied to the clinic by husband who also provides collateral information.    History of present illness: She was diagnosed with myasthenia in 2013 by Dr. Vallarie Mare at [facility] by [facility] antibody testing. CT chest was negative.  Symptoms manifested with R > L ptosis.  She did not have any double vision, difficulty swallowing/talking, shortness of breath, or limb weakness.  She did not benefit with mestinon and discontinued this due to symptoms of malaise.  Prednisone 20mg  was started and slowly tapered to 5mg  in December 2014.  She has been on this dose for the past 3 years and reports having no exacerbations with MG.  She continues to have mild ptosis bilaterally, but it does not bother her.  Again, she denies double vision.   UPDATE 11/20/2017:  She is here for 1 year follow-up visit.  No new complaints with respect to her ptosis.  She continues to take prednisone 5mg  daily and denies any double vision, difficulty swallowing, talking, limb weakness.  She is planning on going to her home in [place] during the winter months.  Fortunately, she has remained well without any interval falls or hospitalizations.  UPDATE 02/17/2018:  She was able to reduce her prednisone to 3mg  daily, but for the past week she has developed ptosis of the right eye, which interferes with her ability to focus on objects.  She denies any double vision, difficulty swallowing/talking, or limb weakness.  She has a number of other medical issues including diarrhea for the past two months and urinary incontinence.  She is eager to return to [facility] once her work-up for these conditions is  completed.   Medications:  Current Outpatient Medications on File Prior to Visit  Medication Sig Dispense Refill  . aspirin EC 81 MG tablet Take by mouth.    . bisacodyl (DULCOLAX) 5 MG EC tablet Take 5 mg by mouth once. 5 mg x 4 for colon 4-3    . Calcium Carbonate (CALCIUM 500 PO) Take 1 capsule by mouth daily.     . cholecalciferol (VITAMIN D) 1000 UNITS tablet Take 2,000 Units by mouth daily.     . ferrous sulfate 325 (65 FE) MG tablet Take 325 mg by mouth daily with breakfast.    . folic acid (FOLVITE) 308 MCG tablet Take 400 mcg by mouth daily.    . Glucosamine-Chondroit-Vit C-Mn (GLUCOSAMINE 1500 COMPLEX PO) Take 1 tablet by mouth daily.     [name] latanoprost (XALATAN) 0.005 % ophthalmic solution Place 1 drop into the left eye at bedtime.  1  . loperamide (IMODIUM) 2 MG capsule Take 2 mg by mouth as needed for diarrhea or loose stools.    [name] losartan-hydrochlorothiazide (HYZAAR) 50-12.5 MG tablet Take 1 tablet by mouth daily. (Patient taking differently: Take 0.5 tablets by mouth daily. ) 90 tablet 3  . Multiple Vitamins-Minerals (PRESERVISION AREDS) CAPS Take 2 capsules by mouth daily.    . Omega-3 Fatty Acids (FISH OIL) 435 MG CAPS Take 1 capsule by mouth daily.    [name] omeprazole (PRILOSEC) 40 MG capsule     . rosuvastatin (CRESTOR) 5 MG tablet TAKE 1 TABLET EVERY DAY 90 tablet  1  . sertraline (ZOLOFT) 50 MG tablet Take 1 tablet (50 mg total) by mouth at bedtime. 30 tablet 3  . traZODone (DESYREL) 50 MG tablet TAKE 1/2 TO 1 TABLET (25-50 MG TOTAL) BY MOUTH AT BEDTIME AS NEEDED FOR SLEEP. 90 tablet 1   No current facility-administered medications on file prior to visit.     Allergies: No Known Allergies  Review of Systems:  CONSTITUTIONAL: No fevers, chills, night sweats, or weight loss.  EYES: No visual changes or eye pain ENT: No hearing changes.  No history of nose bleeds.   RESPIRATORY: No cough, wheezing and shortness of breath.   CARDIOVASCULAR: Negative for chest pain, and  palpitations.   GI: Negative for abdominal discomfort, blood in stools or black stools.  + recent change in bowel habits.   GU:  No history of incontinence.   MUSCLOSKELETAL: No history of joint pain or swelling.  No myalgias.   SKIN: Negative for lesions, rash, and itching.   ENDOCRINE: Negative for cold or heat intolerance, polydipsia or goiter.   PSYCH:  No depression or anxiety symptoms.   NEURO: As Above.   Vital Signs:  BP 140/80   Pulse (!) 102   Ht 5\' 2"  (1.575 m)   Wt 140 lb 6 oz (63.7 kg)   SpO2 98%   BMI 25.67 kg/m    General: Well appearing, well dressed  Neurological Exam: MENTAL STATUS including orientation to time, place, person, recent and remote memory, attention span and concentration, language, and fund of knowledge is normal.  Speech is not dysarthric.  CRANIAL NERVES:   Pupils equal round and reactive to light.  Normal conjugate, extra-ocular eye movements in all directions of gaze.  Moderate right ptosis with upper eye lid up covering 50% R pupil, very mild ptosis on the left. No worsening with sustained upgaze. Facial muscles are 5/5.   Face is symmetric. Palate elevates symmetrically.  Tongue is midline.  MOTOR:  Motor strength is 5/5 in all extremities, no fatigability.  No pronator drift.  Tone is normal.    COORDINATION/GAIT:  Stooped posture, unsteady gait, walks with cane and holding onto husband  Data: Labs 01/2012:  + ACh R antibody at .81 (low range of + >.3)  IMPRESSION/PLAN: Seropositive ocular myasthenia gravis with exacerbation, diagnosed 2013 at [facility] by Dr. Pauline Aus.  She has been clinically stable and has never had an exacerbation.  - The highest dose of prednisone was 20mg  when initially diagnosed and then tapered to 5mg  which she has been on since 2014  - At her last visit, we agreed to try to taper her prednisone slowly and she was able to come down to 3mg  before having worsening right ptosis over the past week  - Increase prednisone  to 5mg  daily  - Did not start mestinon as to avoid potential side effect, as she already has diarrhea     Follow-up with me again in 3 weeks  Greater than 50% of this 25 minute visit was spent in counseling, explanation of diagnosis, planning of further management, and coordination of care.    Thank you for allowing me to participate in patient's care.  If I can answer any additional questions, I would be pleased to do so.    Sincerely,    Elleah Hemsley K. Posey Pronto, DO

## 2018-02-18 ENCOUNTER — Telehealth: Payer: Self-pay

## 2018-02-18 DIAGNOSIS — N952 Postmenopausal atrophic vaginitis: Secondary | ICD-10-CM | POA: Diagnosis not present

## 2018-02-18 DIAGNOSIS — R159 Full incontinence of feces: Secondary | ICD-10-CM | POA: Diagnosis not present

## 2018-02-18 DIAGNOSIS — N3946 Mixed incontinence: Secondary | ICD-10-CM | POA: Diagnosis not present

## 2018-02-18 DIAGNOSIS — R197 Diarrhea, unspecified: Secondary | ICD-10-CM | POA: Diagnosis not present

## 2018-02-18 NOTE — Telephone Encounter (Signed)
-----   Message from Greggory Keen, LPN sent at 2/57/4935  9:21 AM EST ----- Call to come for CBC

## 2018-02-18 NOTE — Telephone Encounter (Signed)
Spoke with female answering the phone. Left with him a reminder that the patient needs her CBC checked again before the procedure date of 03/04/18.

## 2018-02-24 ENCOUNTER — Other Ambulatory Visit (INDEPENDENT_AMBULATORY_CARE_PROVIDER_SITE_OTHER): Payer: Medicare Other

## 2018-02-24 DIAGNOSIS — D649 Anemia, unspecified: Secondary | ICD-10-CM | POA: Diagnosis not present

## 2018-02-24 LAB — CBC WITH DIFFERENTIAL/PLATELET
BASOS ABS: 0 10*3/uL (ref 0.0–0.1)
Basophils Relative: 0.2 % (ref 0.0–3.0)
EOS ABS: 0.1 10*3/uL (ref 0.0–0.7)
EOS PCT: 0.4 % (ref 0.0–5.0)
HCT: 31.2 % — ABNORMAL LOW (ref 36.0–46.0)
HEMOGLOBIN: 10.1 g/dL — AB (ref 12.0–15.0)
Lymphocytes Relative: 8.2 % — ABNORMAL LOW (ref 12.0–46.0)
Lymphs Abs: 0.9 10*3/uL (ref 0.7–4.0)
MCHC: 32.4 g/dL (ref 30.0–36.0)
MCV: 85.2 fl (ref 78.0–100.0)
MONO ABS: 0.7 10*3/uL (ref 0.1–1.0)
Monocytes Relative: 6.4 % (ref 3.0–12.0)
Neutro Abs: 9.7 10*3/uL — ABNORMAL HIGH (ref 1.4–7.7)
Neutrophils Relative %: 84.8 % — ABNORMAL HIGH (ref 43.0–77.0)
Platelets: 331 10*3/uL (ref 150.0–400.0)
RBC: 3.66 Mil/uL — AB (ref 3.87–5.11)
RDW: 24.1 % — ABNORMAL HIGH (ref 11.5–15.5)
WBC: 11.5 10*3/uL — AB (ref 4.0–10.5)

## 2018-02-27 ENCOUNTER — Telehealth: Payer: Self-pay | Admitting: Neurology

## 2018-02-27 NOTE — Telephone Encounter (Signed)
Patient husband called and wants tto speak to someone about patient. He states that she is not doing good and he wants to know if the medication would need to be changed or dosage needs to be changed

## 2018-02-27 NOTE — Telephone Encounter (Signed)
LM for Tamara Gregory to rtrn my call.

## 2018-03-04 ENCOUNTER — Other Ambulatory Visit (INDEPENDENT_AMBULATORY_CARE_PROVIDER_SITE_OTHER): Payer: Medicare Other

## 2018-03-04 ENCOUNTER — Ambulatory Visit (AMBULATORY_SURGERY_CENTER): Payer: Medicare Other | Admitting: Gastroenterology

## 2018-03-04 ENCOUNTER — Encounter: Payer: Self-pay | Admitting: Gastroenterology

## 2018-03-04 ENCOUNTER — Other Ambulatory Visit: Payer: Self-pay

## 2018-03-04 ENCOUNTER — Telehealth: Payer: Self-pay

## 2018-03-04 VITALS — BP 148/80 | HR 66 | Temp 98.0°F | Resp 12 | Ht 62.0 in | Wt 140.0 lb

## 2018-03-04 DIAGNOSIS — D123 Benign neoplasm of transverse colon: Secondary | ICD-10-CM | POA: Diagnosis not present

## 2018-03-04 DIAGNOSIS — R197 Diarrhea, unspecified: Secondary | ICD-10-CM | POA: Diagnosis present

## 2018-03-04 DIAGNOSIS — K635 Polyp of colon: Secondary | ICD-10-CM

## 2018-03-04 DIAGNOSIS — K639 Disease of intestine, unspecified: Secondary | ICD-10-CM | POA: Diagnosis not present

## 2018-03-04 DIAGNOSIS — D122 Benign neoplasm of ascending colon: Secondary | ICD-10-CM

## 2018-03-04 DIAGNOSIS — K6389 Other specified diseases of intestine: Secondary | ICD-10-CM

## 2018-03-04 DIAGNOSIS — C180 Malignant neoplasm of cecum: Secondary | ICD-10-CM | POA: Diagnosis not present

## 2018-03-04 DIAGNOSIS — D509 Iron deficiency anemia, unspecified: Secondary | ICD-10-CM | POA: Diagnosis not present

## 2018-03-04 DIAGNOSIS — I10 Essential (primary) hypertension: Secondary | ICD-10-CM | POA: Diagnosis not present

## 2018-03-04 LAB — CBC WITH DIFFERENTIAL/PLATELET
BASOS ABS: 0 10*3/uL (ref 0.0–0.1)
Basophils Relative: 0.7 % (ref 0.0–3.0)
EOS ABS: 0 10*3/uL (ref 0.0–0.7)
Eosinophils Relative: 0.5 % (ref 0.0–5.0)
HEMATOCRIT: 30.8 % — AB (ref 36.0–46.0)
HEMOGLOBIN: 10 g/dL — AB (ref 12.0–15.0)
LYMPHS PCT: 12 % (ref 12.0–46.0)
Lymphs Abs: 0.8 10*3/uL (ref 0.7–4.0)
MCHC: 32.3 g/dL (ref 30.0–36.0)
MCV: 86.3 fl (ref 78.0–100.0)
Monocytes Absolute: 0.4 10*3/uL (ref 0.1–1.0)
Monocytes Relative: 6.7 % (ref 3.0–12.0)
Neutro Abs: 5.3 10*3/uL (ref 1.4–7.7)
Neutrophils Relative %: 80.1 % — ABNORMAL HIGH (ref 43.0–77.0)
Platelets: 268 10*3/uL (ref 150.0–400.0)
RBC: 3.57 Mil/uL — ABNORMAL LOW (ref 3.87–5.11)
RDW: 22 % — ABNORMAL HIGH (ref 11.5–15.5)
WBC: 6.6 10*3/uL (ref 4.0–10.5)

## 2018-03-04 LAB — COMPREHENSIVE METABOLIC PANEL
ALBUMIN: 3.8 g/dL (ref 3.5–5.2)
ALT: 17 U/L (ref 0–35)
AST: 16 U/L (ref 0–37)
Alkaline Phosphatase: 65 U/L (ref 39–117)
BILIRUBIN TOTAL: 0.4 mg/dL (ref 0.2–1.2)
BUN: 11 mg/dL (ref 6–23)
CALCIUM: 9.6 mg/dL (ref 8.4–10.5)
CO2: 28 mEq/L (ref 19–32)
CREATININE: 0.61 mg/dL (ref 0.40–1.20)
Chloride: 104 mEq/L (ref 96–112)
GFR: 97.69 mL/min (ref >60.00)
Glucose, Bld: 107 mg/dL — ABNORMAL HIGH (ref 70–99)
Potassium: 3.2 mEq/L — ABNORMAL LOW (ref 3.5–5.1)
Sodium: 141 mEq/L (ref 135–145)
Total Protein: 6.7 g/dL (ref 6.0–8.3)

## 2018-03-04 MED ORDER — SODIUM CHLORIDE 0.9 % IV SOLN: 500.0000 mL | Freq: Once | INTRAVENOUS | Status: DC

## 2018-03-04 NOTE — Op Note (Signed)
[facility] Patient Name: Tamara Gregory Procedure Date: 03/04/2018 2:20 PM MRN: [id number] Endoscopist: Milus Banister , MD Age: 84 Referring MD:  Date of Birth: [birth year] Gender: Female Account #: [id number] Procedure:                Colonoscopy Indications:              Chronic diarrhea, Iron deficiency anemia Medicines:                Monitored Anesthesia Care Procedure:                Pre-Anesthesia Assessment:                           - Prior to the procedure, a History and Physical                            was performed, and patient medications and                            allergies were reviewed. The patient's tolerance of                            previous anesthesia was also reviewed. The risks                            and benefits of the procedure and the sedation                            options and risks were discussed with the patient.                            All questions were answered, and informed consent                            was obtained. Prior Anticoagulants: The patient has                            taken no previous anticoagulant or antiplatelet                            agents. ASA Grade Assessment: III - A patient with                            severe systemic disease. After reviewing the risks                            and benefits, the patient was deemed in                            satisfactory condition to undergo the procedure.                           After obtaining informed consent, the colonoscope  was passed under direct vision. Throughout the                            procedure, the patient's blood pressure, pulse, and                            oxygen saturations were monitored continuously. The                            Colonoscope was introduced through the anus and                            advanced to the the cecum, identified by                            appendiceal orifice and  ileocecal valve. The                            colonoscopy was performed without difficulty. The                            patient tolerated the procedure well. The quality                            of the bowel preparation was good. The ileocecal                            valve, appendiceal orifice, and rectum were                            photographed. Scope In: 2:33:11 PM Scope Out: 2:57:03 PM Scope Withdrawal Time: 0 hours 15 minutes 35 seconds  Total Procedure Duration: 0 hours 23 minutes 52 seconds  Findings:                 There was a clearly malignant mass involving the                            cecum, IC valve, very proximal ascending colon (see                            images). This was 4-5cm across, nearly                            circumferential, ulcerated. Multiple biopsies were                            taken and sent for histology. There were three                            polyps that were clearly adenomatous (about 1cm                            each) within 6-7cm of the maligant tumor in the  ascending colon (see images). I placed a submucosal                            injection of Niger Ink at the most distal of those                            polyps.                           A 10 mm polyp was found in the transverse colon.                            The polyp was sessile. The polyp was removed with a                            hot snare. Resection was complete, but the polyp                            tissue was not retrieved.                           The exam was otherwise without abnormality on                            direct and retroflexion views. Complications:            No immediate complications. Estimated blood loss:                            None. Estimated Blood Loss:     Estimated blood loss: none. Impression:               - Malignant partially obstructing tumor involving                            the  cecum, IC valve and proximal ascending colon.                            This was biopsied.                           - Three clearly adenomatous polyps, nearly adjacent                            to the cancer (in the ascending colon) were labeled                            with submucosal injection of Niger Ink (see images).                           - One 10 mm polyp in the transverse colon, removed                            with a hot snare. Complete resection. Polyp tissue  not retrieved.                           - The examination was otherwise normal on direct                            and retroflexion views. Recommendation:           - Patient has a contact number available for                            emergencies. The signs and symptoms of potential                            delayed complications were discussed with the                            patient. Return to normal activities tomorrow.                            Written discharge instructions were provided to the                            patient.                           - Resume previous diet.                           - Continue present medications.                           - Repeat colonoscopy in 1 year for surveillance.                           - Dr. Ardis Hughs' office will start staging workup;                            Labs (cmet, cmet, CEA) and CT scan (Chest, abdomen,                            pelvis with IV and oral contrast) and also a                            referral to CC surgery. Milus Banister, MD 03/04/2018 3:12:25 PM This report has been signed electronically.

## 2018-03-04 NOTE — Patient Instructions (Signed)
Patient going to lab for requested lab work.  Contrast given for CT scan.  YOU HAD AN ENDOSCOPIC PROCEDURE TODAY AT [facility]:   Refer to the procedure report that was given to you for any specific questions about what was found during the examination.  If the procedure report does not answer your questions, please call your gastroenterologist to clarify.  If you requested that your care partner not be given the details of your procedure findings, then the procedure report has been included in a sealed envelope for you to review at your convenience later.  YOU SHOULD EXPECT: Some feelings of bloating in the abdomen. Passage of more gas than usual.  Walking can help get rid of the air that was put into your GI tract during the procedure and reduce the bloating. If you had a lower endoscopy (such as a colonoscopy or flexible sigmoidoscopy) you may notice spotting of blood in your stool or on the toilet paper. If you underwent a bowel prep for your procedure, you may not have a normal bowel movement for a few days.  Please Note:  You might notice some irritation and congestion in your nose or some drainage.  This is from the oxygen used during your procedure.  There is no need for concern and it should clear up in a day or so.  SYMPTOMS TO REPORT IMMEDIATELY:   Following lower endoscopy (colonoscopy or flexible sigmoidoscopy):  Excessive amounts of blood in the stool  Significant tenderness or worsening of abdominal pains  Swelling of the abdomen that is new, acute  Fever of 100F or higher   For urgent or emergent issues, a gastroenterologist can be reached at any hour by calling [phone].   DIET:  We do recommend a small meal at first, but then you may proceed to your regular diet.  Drink plenty of fluids but you should avoid alcoholic beverages for 24 hours.  ACTIVITY:  You should plan to take it easy for the rest of today and you should NOT DRIVE or use heavy  machinery until tomorrow (because of the sedation medicines used during the test).    FOLLOW UP: Our staff will call the number listed on your records the next business day following your procedure to check on you and address any questions or concerns that you may have regarding the information given to you following your procedure. If we do not reach you, we will leave a message.  However, if you are feeling well and you are not experiencing any problems, there is no need to return our call.  We will assume that you have returned to your regular daily activities without incident.  If any biopsies were taken you will be contacted by phone or by letter within the next 1-3 weeks.  Please call us at [phone] if you have not heard about the biopsies in 3 weeks.    SIGNATURES/CONFIDENTIALITY: You and/or your care partner have signed paperwork which will be entered into your electronic medical record.  These signatures attest to the fact that that the information above on your After Visit Summary has been reviewed and is understood.  Full responsibility of the confidentiality of this discharge information lies with you and/or your care-partner.

## 2018-03-04 NOTE — Progress Notes (Signed)
Patient reports her myasthenia gravis has flared up in her right eye since previsit. Has resumed her prescribed eye drops

## 2018-03-04 NOTE — Progress Notes (Signed)
Report given to PACU, vss 

## 2018-03-04 NOTE — Progress Notes (Signed)
Called to room to assist during endoscopic procedure.  Patient ID and intended procedure confirmed with present staff. Received instructions for my participation in the procedure from the performing physician.  

## 2018-03-04 NOTE — Telephone Encounter (Signed)
Dr. Ardis Hughs' office will start staging workup;  Labs (cbc, cmet, CEA) and CT scan (Chest, abdomen, pelvis with IV and oral contrast) and also referral to CC surgery.  You have been scheduled for a CT scan of the chest, abdomen and pelvis at [facility] ([address]---this is in the same building as [facility]).   You are scheduled on 03/06/18 at 930 am. You should arrive 15 minutes prior to your appointment time for registration. Please follow the written instructions below on the day of your exam:  WARNING: IF YOU ARE ALLERGIC TO IODINE/X-RAY DYE, PLEASE NOTIFY RADIOLOGY IMMEDIATELY AT [phone]! YOU WILL BE GIVEN A 13 HOUR PREMEDICATION PREP.  1) Do not eat or drink anything after 530 am (4 hours prior to your test) 2) You have been given 2 bottles of oral contrast to drink. The solution may taste better if refrigerated, but do NOT add ice or any other liquid to this solution. Shake well before drinking.   Drink 1 bottle of contrast @ 730 am (2 hours prior to your exam) Drink 1 bottle of contrast @ 830 am (1 hour prior to your exam)  You may take any medications as prescribed with a small amount of water except for the following: Metformin, Glucophage, Glucovance, Avandamet, Riomet, Fortamet, Actoplus Met, Janumet, Glumetza or Metaglip. The above medications must be held the day of the exam AND 48 hours after the exam.  The purpose of you drinking the oral contrast is to aid in the visualization of your intestinal tract. The contrast solution may cause some diarrhea. Before your exam is started, you will be given a small amount of fluid to drink. Depending on your individual set of symptoms, you may also receive an intravenous injection of x-ray contrast/dye. Plan on being at [facility] for 30 minutes or longer, depending on the type of exam you are having performed.  This test typically takes 30-45 minutes to complete.  If you have any questions regarding your  exam or if you need to reschedule, you may call the CT department at [phone] between the hours of 8:00 am and 5:00 pm, Monday-Friday.   [facility] staff will provide the pt with the contrast and instructions for CT scan.  She will have labs today after procedure and [facility] referral faxed.  _____________________________________________________

## 2018-03-05 ENCOUNTER — Telehealth: Payer: Self-pay

## 2018-03-05 ENCOUNTER — Other Ambulatory Visit (INDEPENDENT_AMBULATORY_CARE_PROVIDER_SITE_OTHER): Payer: Medicare Other

## 2018-03-05 DIAGNOSIS — K6389 Other specified diseases of intestine: Secondary | ICD-10-CM

## 2018-03-05 DIAGNOSIS — K639 Disease of intestine, unspecified: Secondary | ICD-10-CM

## 2018-03-05 LAB — CBC WITH DIFFERENTIAL/PLATELET
BASOS ABS: 0 10*3/uL (ref 0.0–0.1)
Basophils Relative: 0.3 % (ref 0.0–3.0)
EOS ABS: 0.1 10*3/uL (ref 0.0–0.7)
Eosinophils Relative: 0.8 % (ref 0.0–5.0)
HCT: 29.4 % — ABNORMAL LOW (ref 36.0–46.0)
Hemoglobin: 9.6 g/dL — ABNORMAL LOW (ref 12.0–15.0)
LYMPHS ABS: 0.8 10*3/uL (ref 0.7–4.0)
Lymphocytes Relative: 9.7 % — ABNORMAL LOW (ref 12.0–46.0)
MCHC: 32.7 g/dL (ref 30.0–36.0)
MCV: 85.9 fl (ref 78.0–100.0)
MONOS PCT: 7.3 % (ref 3.0–12.0)
Monocytes Absolute: 0.6 10*3/uL (ref 0.1–1.0)
NEUTROS PCT: 81.9 % — AB (ref 43.0–77.0)
Neutro Abs: 6.6 10*3/uL (ref 1.4–7.7)
Platelets: 258 10*3/uL (ref 150.0–400.0)
RBC: 3.43 Mil/uL — AB (ref 3.87–5.11)
RDW: 21.8 % — ABNORMAL HIGH (ref 11.5–15.5)
WBC: 8.1 10*3/uL (ref 4.0–10.5)

## 2018-03-05 LAB — CEA: CEA: 7.9 ng/mL — AB

## 2018-03-05 NOTE — Telephone Encounter (Signed)
  Follow up Call-  Call back number 03/04/2018 03/28/2017  Post procedure Call Back phone  # [phone]  Permission to leave phone message Yes Yes  Some recent data might be hidden     Patient questions:  Do you have a fever, pain , or abdominal swelling? No. Pain Score  0 *  Have you tolerated food without any problems? Yes.    Have you been able to return to your normal activities? Yes.    Do you have any questions about your discharge instructions: Diet   No. Medications  No. Follow up visit  No.  Do you have questions or concerns about your Care? No.  Actions: * If pain score is 4 or above: No action needed, pain <4. Patient states she has had 2 very large episodes of explosive diarrhea dark liquid in color. Only had oatmeal for breakfast today. Please advise.

## 2018-03-05 NOTE — Telephone Encounter (Signed)
Spoke with Dr. Ardis Hughs and made him aware of Ms. Lanagan' problems.  He will have his nurse Patty give her a call

## 2018-03-05 NOTE — Telephone Encounter (Signed)
Left message on answering machine. 

## 2018-03-05 NOTE — Telephone Encounter (Signed)
Per verbal order Dr Ardis Hughs request CBC for Tamara Gregory today or tomorrow morning for black diarrhea.  Spoke to the Tamara Gregory husband and he will bring the Tamara Gregory in this afternoon for labs .

## 2018-03-06 ENCOUNTER — Ambulatory Visit (INDEPENDENT_AMBULATORY_CARE_PROVIDER_SITE_OTHER)
Admission: RE | Admit: 2018-03-06 | Discharge: 2018-03-06 | Disposition: A | Discharge status: Home / Self Care | Payer: Medicare Other | Source: Ambulatory Visit | Attending: Gastroenterology | Admitting: Gastroenterology

## 2018-03-06 ENCOUNTER — Ambulatory Visit (INDEPENDENT_AMBULATORY_CARE_PROVIDER_SITE_OTHER): Payer: Medicare Other | Admitting: Neurology

## 2018-03-06 ENCOUNTER — Encounter: Payer: Self-pay | Admitting: Neurology

## 2018-03-06 VITALS — BP 130/70 | HR 72 | Ht 62.0 in | Wt 140.4 lb

## 2018-03-06 DIAGNOSIS — D509 Iron deficiency anemia, unspecified: Secondary | ICD-10-CM | POA: Diagnosis not present

## 2018-03-06 DIAGNOSIS — R911 Solitary pulmonary nodule: Secondary | ICD-10-CM | POA: Diagnosis not present

## 2018-03-06 DIAGNOSIS — K449 Diaphragmatic hernia without obstruction or gangrene: Secondary | ICD-10-CM | POA: Diagnosis not present

## 2018-03-06 DIAGNOSIS — G7000 Myasthenia gravis without (acute) exacerbation: Secondary | ICD-10-CM | POA: Diagnosis not present

## 2018-03-06 DIAGNOSIS — K639 Disease of intestine, unspecified: Secondary | ICD-10-CM

## 2018-03-06 DIAGNOSIS — K6389 Other specified diseases of intestine: Secondary | ICD-10-CM

## 2018-03-06 MED ORDER — PYRIDOSTIGMINE BROMIDE 60 MG PO TABS: ORAL_TABLET | ORAL | 3 refills | Status: DC

## 2018-03-06 MED ORDER — PREDNISONE 5 MG PO TABS: 10.0000 mg | ORAL_TABLET | Freq: Every day | ORAL | 3 refills | Status: DC

## 2018-03-06 MED ORDER — IOPAMIDOL (ISOVUE-300) INJECTION 61%
100.0000 mL | Freq: Once | INTRAVENOUS | Status: AC | PRN
  Administered 2018-03-06: 100 mL via INTRAVENOUS

## 2018-03-06 NOTE — Progress Notes (Signed)
Follow-up Visit   Date: 03/06/18    Tamara Gregory MRN: [id number] DOB: 03-24-1925   Interim History: Tamara Gregory is a 84 y.o. Caucasian female with hypertension, depression, GERD, hyperlipidemia, chronic low back pain s/p spinal cord stimulator, and newly diagnosed colon cancer returning to the clinic for follow-up of ocular myasthenia.  The patient was accompanied to the clinic by husband who also provides collateral information.    History of present illness: She was diagnosed with myasthenia in 2013 by Dr. Vallarie Mare at [facility] by AChR antibody testing. CT chest was negative.  Symptoms manifested with R > L ptosis.  She did not have any double vision, difficulty swallowing/talking, shortness of breath, or limb weakness.  She did not benefit with mestinon and discontinued this due to symptoms of malaise.  Prednisone 20mg  was started and slowly tapered to 5mg  in December 2014.  She has been on this dose for the past 3 years and reports having no exacerbations with MG.  She continues to have mild ptosis bilaterally, but it does not bother her.  Again, she denies double vision.   In late 2018, I offered to taper her prednisone and she did well on 4mg  daily, but started to have worsening ptosis with 3mg .  UPDATE 03/06/2018:  She is here for follow-up visit. She has been on prednisone 5mg  for 3 weeks and has not appreciated any improvement in the droopiness of her right eye. She denies any double vision, difficulty swallowing or talking.  She has been struggling with diarrhea for 3 months and yesterday has colonoscopy which showed malignant colon mass.  She is undergoing further imaging today.    Medications:  Current Outpatient Medications on File Prior to Visit  Medication Sig Dispense Refill  . aspirin EC 81 MG tablet Take by mouth.    . Calcium Carbonate (CALCIUM 500 PO) Take 1 capsule by mouth daily.     . cholecalciferol (VITAMIN D) 1000 UNITS tablet Take 2,000 Units by mouth  daily.     . ferrous sulfate 325 (65 FE) MG tablet Take 325 mg by mouth daily with breakfast.    . folic acid (FOLVITE) 329 MCG tablet Take 400 mcg by mouth daily.    . Glucosamine-Chondroit-Vit C-Mn (GLUCOSAMINE 1500 COMPLEX PO) Take 1 tablet by mouth daily.     [name] latanoprost (XALATAN) 0.005 % ophthalmic solution Place 1 drop into the left eye at bedtime.  1  . loperamide (IMODIUM) 2 MG capsule Take 2 mg by mouth as needed for diarrhea or loose stools.    [name] losartan-hydrochlorothiazide (HYZAAR) 50-12.5 MG tablet Take 1 tablet by mouth daily. (Patient taking differently: Take 0.5 tablets by mouth daily. ) 90 tablet 3  . Multiple Vitamins-Minerals (PRESERVISION AREDS) CAPS Take 2 capsules by mouth daily.    . Omega-3 Fatty Acids (FISH OIL) 435 MG CAPS Take 1 capsule by mouth daily.    [name] omeprazole (PRILOSEC) 40 MG capsule     . rosuvastatin (CRESTOR) 5 MG tablet TAKE 1 TABLET EVERY DAY 90 tablet 1  . sertraline (ZOLOFT) 50 MG tablet Take 1 tablet (50 mg total) by mouth at bedtime. 30 tablet 3  . traZODone (DESYREL) 50 MG tablet TAKE 1/2 TO 1 TABLET (25-50 MG TOTAL) BY MOUTH AT BEDTIME AS NEEDED FOR SLEEP. 90 tablet 1   Current Facility-Administered Medications on File Prior to Visit  Medication Dose Route Frequency Provider Last Rate Last Dose  . 0.9 %  sodium chloride infusion  500 mL Intravenous Once Milus Banister, MD        Allergies: No Known Allergies  Review of Systems:  CONSTITUTIONAL: No fevers, chills, night sweats, +weight loss.  EYES: +visual changes or eye pain ENT: No hearing changes.  No history of nose bleeds.   RESPIRATORY: No cough, wheezing and shortness of breath.   CARDIOVASCULAR: Negative for chest pain, and palpitations.   GI: Negative for abdominal discomfort, blood in stools or black stools.  + recent change in bowel habits.   GU:  No history of incontinence.   MUSCLOSKELETAL: No history of joint pain or swelling.  No myalgias.   SKIN: Negative for lesions,  rash, and itching.   ENDOCRINE: Negative for cold or heat intolerance, polydipsia or goiter.   PSYCH:  No depression or anxiety symptoms.   NEURO: As Above.   Vital Signs:  BP 130/70   Pulse 72   Ht 5\' 2"  (1.575 m)   Wt 140 lb 6 oz (63.7 kg)   SpO2 98%   BMI 25.67 kg/m   General: Tired-appearing, well dressed  Neurological Exam: MENTAL STATUS including orientation to time, place, person, recent and remote memory, attention span and concentration, language, and fund of knowledge is normal.  Speech is not dysarthric.  CRANIAL NERVES:   Pupils equal round and reactive to light.  Normal conjugate, extra-ocular eye movements in all directions of gaze.  Moderate right ptosis with upper eye lid up covering 50% R pupil, very mild ptosis on the left. No worsening with sustained upgaze. Facial muscles are 5/5.   Face is symmetric. Palate elevates symmetrically.  Tongue is midline.  MOTOR:  Motor strength is 5/5 in all extremities, no fatigability.  No pronator drift.  Tone is normal.    COORDINATION/GAIT:  Stooped posture, unsteady gait, assisted with rollator  Data: Labs 01/2012:  + ACh R antibody at .81 (low range of + >.3)  IMPRESSION/PLAN: Seropositive ocular myasthenia gravis with exacerbation, diagnosed 2013 at [facility] by Dr. Pauline Aus.  She was doing great on prednisone 5mg  without exacerbation, however in attempt to wean her further she started having worsening right ptosis in February 2019 when she got down to prednisone 3mg  daily and no improvement when she went back up to 5mg  daily. Recommend increasing prednisone to 10mg  daily and start low dose mestinon 30mg  twice daily, if there is no worsening of diarrhea, can increase to 60mg  TID.  She was instructed to stop mestinon, if diarrhea or cramping gets worse.  New diagnosis of colon cancer, undergoing evaluation by Dr. Ardis Hughs.    Call with update in 3 weeks  Greater than 50% of this 25 minute visit was spent in counseling,  explanation of diagnosis, planning of further management, and coordination of care.    Thank you for allowing me to participate in patient's care.  If I can answer any additional questions, I would be pleased to do so.    Sincerely,    Tanice Petre K. Posey Pronto, DO

## 2018-03-06 NOTE — Patient Instructions (Signed)
Increase prednisone to 10mg  daily  Start mestinon 30mg  (half-tablet) at 9am and half tablet at 2pm.  Call me in 3 weeks with an update

## 2018-03-10 ENCOUNTER — Ambulatory Visit: Payer: Self-pay | Admitting: Surgery

## 2018-03-10 ENCOUNTER — Telehealth: Payer: Self-pay | Admitting: Cardiovascular Disease

## 2018-03-10 DIAGNOSIS — N83201 Unspecified ovarian cyst, right side: Secondary | ICD-10-CM | POA: Diagnosis not present

## 2018-03-10 DIAGNOSIS — Z01818 Encounter for other preprocedural examination: Secondary | ICD-10-CM | POA: Diagnosis not present

## 2018-03-10 DIAGNOSIS — K56609 Unspecified intestinal obstruction, unspecified as to partial versus complete obstruction: Secondary | ICD-10-CM | POA: Diagnosis not present

## 2018-03-10 DIAGNOSIS — C180 Malignant neoplasm of cecum: Secondary | ICD-10-CM | POA: Diagnosis not present

## 2018-03-10 NOTE — H&P (View-Only) (Signed)
Tamara Gregory Documented: 03/10/2018 12:01 PM Location: [facility] Patient #: [id number] DOB: 1925/11/24 Married / Language: Tamara Gregory / Race: White Female  History of Present Illness Tamara Gregory; 03/10/2018 12:54 PM) The patient is a 84 year old female who presents with colorectal cancer. Note for "Colorectal cancer": ` ` ` Patient sent for surgical consultation at the request of Dr. Oretha Caprice  Chief Complaint: Near obstructing colon cancer of cecum  The patient is a spry 84 year old female. Comes today with her husband. Normally moves her bowels about every third day. Noticed more crampy abdominal pain and more watery diarrhea. This is happening over the last 3 months. CAT scan done showing bulky tumor and cecum. Colonoscopy confirmed mass that cecum/ascending colon. Near obstructing. Biopsy consistent with adenocarcinoma. Right ovary cyst adherent near it. Urgent surgical consultation requested. Patient walks with a walker. She's been having some issues of incontinence to feces. [name] wears a diaper for urinary incontinence. She had abdominal hysterectomy and cholecystectomy. No other surgeries. She does not smoke or drink. She needs a rolling walker for balance but can walk 15-20 minutes with that. She's had about 10 pounds of unintentional weight loss. Afraid to eat less she get crampy pain or diarrhea. Been tolerating supplemental shakes though. No fevers or sweats.  No personal nor family history of GI/colon cancer, inflammatory bowel disease, irritable bowel syndrome, allergy such as Celiac Sprue, dietary/dairy problems, colitis, ulcers nor gastritis. No recent sick contacts/gastroenteritis. No travel outside the country. No changes in diet. No dysphagia to solids or liquids. No significant heartburn or reflux. No hematochezia, hematemesis, coffee ground emesis. No evidence of prior gastric/peptic ulceration. No heart attacks or strokes. Not  anticoagulated. Not a diabetic. She does not smoke. She does have a chronic right eye droop and has been diagnosed with ocular myasthenia gravis. Followed by Dr. Posey Pronto with [facility] neurology. She's been intermittently on prednisone. Not able to fully taper off.  (Review of systems as stated in this history (HPI) or in the review of systems. 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 Baseline prednisone use. Followed by Dr. Posey Pronto with [facility] neurology  Past Surgical History (Tanisha A. Owens Shark, [place]; 03/10/2018 12:02 PM) Cataract Surgery Bilateral. Gallbladder Surgery - Laparoscopic Hemorrhoidectomy Hysterectomy (not due to cancer) - Complete Spinal Surgery - Lower Back Tonsillectomy  Diagnostic Studies History (Tanisha A. Owens Shark, [place]; 03/10/2018 12:02 PM) Colonoscopy within last year Mammogram >3 years ago Pap Smear >5 years ago  Allergies (Tanisha A. Owens Shark, [place]; 03/10/2018 12:03 PM) No Known Drug Allergies [03/10/2018]: Allergies Reconciled  Medication History (Tanisha A. Owens Shark, RMA; 03/10/2018 12:07 PM) Aspirin (81MG  Tablet, Oral) Active. Calcium (500MG  Tablet, Oral) Active. Vitamin D (1000UNIT Tablet, Oral) Active. Ferrous Sulfate (325 (65 Fe)MG Tablet, Oral) Active. Folic Acid (443XVQ Tablet, Oral) Active. Latanoprost (0.005% Solution, Ophthalmic) Active. Imodium (2MG  Capsule, Oral) Active. Losartan Potassium-HCTZ (50-12.5MG  Tablet, Oral) Active. Multi-Vitamin (Oral) Active. Omega-3 (350MG  Capsule DR, Oral) Active. Omeprazole (40MG  Capsule DR, Oral) Active. Deltasone (5MG  Tablet, Oral) Active. Mestinon (60MG  Tablet, Oral) Active. Crestor (5MG  Tablet, Oral) Active. Zoloft (50MG  Tablet, Oral) Active. TraZODone HCl (50MG  Tablet, Oral) Active. Medications Reconciled  Social History (Tanisha A. Owens Shark, [place]; 03/10/2018  12:02 PM) Alcohol use Occasional alcohol use. No caffeine use No drug use Tobacco use Never smoker.  Family History (Tanisha A. Owens Shark, [place]; 03/10/2018 12:02 PM) Arthritis Mother. Breast Cancer Sister.  Diabetes Mellitus Father, Sister. Heart Disease Father. Respiratory Condition Brother.  Pregnancy / Birth History (Tanisha A. Owens Shark, [place]; 03/10/2018 12:02 PM) Age at menarche 82 years. Age of menopause 35-55 Gravida 3 Maternal age 8-30 Para 2  Other Problems Tamara Gregory, Gregory; 03/10/2018 12:53 PM) Anxiety Disorder Arthritis Back Pain Bladder Problems Colon Cancer Depression Melanoma Oophorectomy Bilateral.     Review of Systems (Tanisha A. Brown RMA; 03/10/2018 12:02 PM) General Present- Appetite Loss, Fatigue and Weight Loss. Not Present- Chills, Fever, Night Sweats and Weight Gain. HEENT Present- Wears glasses/contact lenses. Not Present- Earache, Hearing Loss, Hoarseness, Nose Bleed, Oral Ulcers, Ringing in the Ears, Seasonal Allergies, Sinus Pain, Sore Throat, Visual Disturbances and Yellow Eyes. Cardiovascular Present- Shortness of Breath. Not Present- Chest Pain, Difficulty Breathing Lying Down, Leg Cramps, Palpitations, Rapid Heart Rate and Swelling of Extremities. Gastrointestinal Present- Change in Bowel Habits and Chronic diarrhea. Not Present- Abdominal Pain, Bloating, Bloody Stool, Constipation, Difficulty Swallowing, Excessive gas, Gets full quickly at meals, Hemorrhoids, Indigestion, Nausea, Rectal Pain and Vomiting. Musculoskeletal Present- Back Pain and Muscle Weakness. Not Present- Joint Pain, Joint Stiffness, Muscle Pain and Swelling of Extremities. Neurological Present- Headaches. Not Present- Decreased Memory, Fainting, Numbness, Seizures, Tingling, Tremor, Trouble walking and Weakness. Psychiatric Present- Depression. Not Present- Anxiety, Bipolar, Change in Sleep Pattern, Fearful and Frequent crying. Endocrine Not Present- Cold  Intolerance, Excessive Hunger, Hair Changes, Heat Intolerance, Hot flashes and New Diabetes. Hematology Not Present- Blood Thinners, Easy Bruising, Excessive bleeding, Gland problems, HIV and Persistent Infections.  Vitals (Tanisha A. Brown RMA; 03/10/2018 12:03 PM) 03/10/2018 12:02 PM Weight: 140.8 lb Height: 63in Body Surface Area: 1.67 m Body Mass Index: 24.94 kg/m  Temp.: 98.15F  Pulse: 82 (Regular)  BP: 124/84 (Sitting, Left Arm, Standard)      Physical Exam Tamara Gregory; 03/10/2018 12:23 PM)  General Mental Status-Alert. General Appearance-Not in acute distress, Not Sickly. Orientation-Oriented X3. Hydration-Well hydrated. Voice-Normal.  Integumentary Global Assessment Upon inspection and palpation of skin surfaces of the - Axillae: non-tender, no inflammation or ulceration, no drainage. and Distribution of scalp and body hair is normal. General Characteristics Temperature - normal warmth is noted.  Head and Neck Head-normocephalic, atraumatic with no lesions or palpable masses. Face Global Assessment - atraumatic, no absence of expression. Neck Global Assessment - no abnormal movements, no bruit auscultated on the right, no bruit auscultated on the left, no decreased range of motion, non-tender. Trachea-midline. Thyroid Gland Characteristics - non-tender.  Eye Eyeball - Left-Extraocular movements intact, No Nystagmus. Eyeball - Right-Extraocular movements intact, No Nystagmus. Cornea - Left-No Hazy. Cornea - Right-No Hazy. Sclera/Conjunctiva - Left-No scleral icterus, No Discharge. Sclera/Conjunctiva - Right-No scleral icterus, No Discharge. Pupil - Left-Direct reaction to light normal. Pupil - Right-Direct reaction to light normal. Note: Right eyelid droop halfway down. Tells me she has a history of myasthenia on that side.  ENMT Ears Pinna - Left - no drainage observed, no generalized tenderness observed. Right  - no drainage observed, no generalized tenderness observed. Nose and Sinuses External Inspection of the Nose - no destructive lesion observed. Inspection of the nares - Left - quiet respiration. Right - quiet respiration. Mouth and Throat Lips - Upper Lip - no fissures observed, no pallor noted. Lower Lip - no fissures observed, no pallor noted. Nasopharynx - no discharge present. Oral Cavity/Oropharynx - Tongue - no dryness observed. Oral Mucosa - no cyanosis observed. Hypopharynx - no evidence of airway distress observed.  Chest and Lung Exam Inspection Movements - Normal  and Symmetrical. Accessory muscles - No use of accessory muscles in breathing. Palpation Palpation of the chest reveals - Non-tender. Auscultation Breath sounds - Normal and Clear.  Cardiovascular Auscultation Rhythm - Regular. Murmurs & Other Heart Sounds - Auscultation of the heart reveals - No Murmurs and No Systolic Clicks.  Abdomen Inspection Inspection of the abdomen reveals - No Visible peristalsis and No Abnormal pulsations. Umbilicus - No Bleeding, No Urine drainage. Palpation/Percussion Palpation and Percussion of the abdomen reveal - Soft, Non Tender, No Rebound tenderness, No Rigidity (guarding) and No Cutaneous hyperesthesia. Note: Abdomen soft. Not severely distended. No distasis recti. No umbilical or other anterior abdominal wall hernias  Female Genitourinary Sexual Maturity Tanner 5 - Adult hair pattern. Note: Pfannenstiel incision. No inguinal hernias. No vaginal bleeding nor discharge  Rectal Note: Right external hemorrhoid. Low normal but intact sphincter tone. No sphincter defects. No pruritus.  Peripheral Vascular Upper Extremity Inspection - Left - No Cyanotic nailbeds, Not Ischemic. Right - No Cyanotic nailbeds, Not Ischemic.  Neurologic Neurologic evaluation reveals -normal attention span and ability to concentrate, able to name objects and repeat phrases. Appropriate fund of  knowledge , normal sensation and normal coordination. Mental Status Affect - not angry, not paranoid. Cranial Nerves-Normal Bilaterally. Gait-Normal.  Neuropsychiatric Mental status exam performed with findings of-able to articulate well with normal speech/language, rate, volume and coherence, thought content normal with ability to perform basic computations and apply abstract reasoning and no evidence of hallucinations, delusions, obsessions or homicidal/suicidal ideation. Note: Mentally sharp. Joking. Follows commands easily. No evidence of any dementia or delirium.  Musculoskeletal Global Assessment Spine, Ribs and Pelvis - no instability, subluxation or laxity. Right Upper Extremity - no instability, subluxation or laxity.  Lymphatic Head & Neck  General Head & Neck Lymphatics: Bilateral - Description - No Localized lymphadenopathy. Axillary  General Axillary Region: Bilateral - Description - No Localized lymphadenopathy. Femoral & Inguinal  Generalized Femoral & Inguinal Lymphatics: Left - Description - No Localized lymphadenopathy. Right - Description - No Localized lymphadenopathy.    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 Most likely explaining the bowel habits of crampy abdominal pain and intermittent diarrhea.  While she is of advanced age, she is actually getting around okay and is very mentally sharp. I worried that if this is not operated upon, it will completely obstruct and cause much more severe problems with laparotomy and colostomy. Morbidity much higher.  She is near obstructing so we need to try and do this urgently ideally. Patient went from joking about getting nothing done to getting it done today.  The risks would be minimized if we can electively resect this. Minimally invasive approach. Ideally robotic resection with intracorporeal  anastomosis. En bloc right salpingo-oophorectomy since ovarian cyst nearby. Try minimize operative risk.  Ideally would like to get clearance from her cardiologist to see if she is at least optimized.  Suspect she'll need skilled nursing facility for rehabilitation to recover given her balance issues and dependence on walker and advanced age. He would like to avoid that. I strongly recommended nutrition and exercise to help minimize morbidity in other issues  Current Plans I recommended obtaining preoperative cardiac clearance. I am concerned about the health of the patient and the ability to tolerate the operation. Therefore, we will request clearance by cardiology to better assess operative risk & see if a reevaluation, further workup, etc is needed.  Also recommendations on how medications such as for anticoagulation and blood pressure should be managed/held/restarted after surgery. You are being scheduled for surgery- Our schedulers will call you.  You should hear from our office's scheduling department within 5 working days about the location, date, and time of surgery. We try to make accommodations for patient's preferences in scheduling surgery, but sometimes the OR schedule or the surgeon's schedule prevents [place] from making those accommodations.  If you have not heard from our office [phone]) in 5 working days, call the office and ask for your surgeon's nurse.  If you have other questions about your diagnosis, plan, or surgery, call the office and ask for your surgeon's nurse.  Written instructions provided  COLON OBSTRUCTION (K56.609) Impression: Partial colon obstruction from bulky tumor at cecum/ascending colon.  I think she will require surgery before this becomes completely obstructed.  Current Plans Pt Education - [facility] Good Bowel Health ([place])  RIGHT OVARIAN CYST (N83.201) Impression: Right ovarian cyst. CT simple but adherent to colon. Most likely will require  salpingo-oophorectomy on that side en bloc.   PREOP COLON - ENCOUNTER FOR PREOPERATIVE EXAMINATION FOR GENERAL SURGICAL PROCEDURE (G95.621)  Current Plans The anatomy & physiology of the digestive tract was discussed. The pathophysiology of the colon was discussed. Natural history risks without surgery was discussed. I feel the risks of no intervention will lead to serious problems that outweigh the operative risks; therefore, I recommended a partial colectomy to remove the pathology. Minimally invasive (Robotic/Laparoscopic) & open techniques were discussed.  Risks such as bleeding, infection, abscess, leak, reoperation, possible ostomy, hernia, heart attack, death, and other risks were discussed. I noted a good likelihood this will help address the problem. Goals of post-operative recovery were discussed as well. Need for adequate nutrition, daily bowel regimen and healthy physical activity, to optimize recovery was noted as well. We will work to minimize complications. Educational materials were available as well. Questions were answered. The patient expresses understanding & wishes to proceed with surgery.  Pt Education - [facility] Colon Bowel Prep 2018 ERAS/Miralax/Antibiotics Started Neomycin Sulfate 500 MG Oral Tablet, 2 (two) Tablet SEE NOTE, #6, 03/10/2018, No Refill. Local Order: TAKE TWO TABLETS AT 2 PM, 3 PM, AND 10 PM THE DAY PRIOR TO SURGERY Started Flagyl 500 MG Oral Tablet, 2 (two) Tablet SEE NOTE, #6, 03/10/2018, No Refill. Local Order: Take at 2pm, 3pm, and 10pm the day prior to your colon operation Pt Education - Pamphlet Given - Laparoscopic Colorectal Surgery: discussed with patient and provided information. Pt Education - [facility] Colectomy post-op instructions: discussed with patient and provided information.  Tamara Gregory, M.D., F.A.C.S. Gastrointestinal and Minimally Invasive Surgery [facility] [address] [place], [place]  [address] [phone] Main / Paging

## 2018-03-10 NOTE — Telephone Encounter (Signed)
New Message  Clearance already faxed over     [place] Pre-operative Risk Assessment    Request for surgical clearance:  1. What type of surgery is being performed? Robotic resection of colon cancer  2. When is this surgery scheduled? 03/17/18  3. What type of clearance is required (medical clearance vs. Pharmacy clearance to hold med vs. Both)? Cardiac clearance  4. Are there any medications that need to be held prior to surgery and how long? no  5. Practice name and name of physician performing surgery? [facility]/ Dr. Michael Boston  6. What is your office phone and fax number? [phone] Fax: [phone]  7. Anesthesia type (None, local, MAC, general) ? General   Tamara Gregory 03/10/2018, 2:36 PM  _________________________________________________________________   (provider comments below)

## 2018-03-10 NOTE — H&P (Signed)
[name] Documented: 03/10/2018 12:01 PM Location: [facility] Patient #: [id number] DOB: Jan 30, 1925 Married / Language: Cleophus Molt / Race: White Female  History of Present Illness Adin Hector MD; 03/10/2018 12:54 PM) The patient is a 84 year old female who presents with colorectal cancer. Note for "Colorectal cancer": ` ` ` Patient sent for surgical consultation at the request of Dr. Oretha Caprice  Chief Complaint: Near obstructing colon cancer of cecum  The patient is a spry 84 year old female. Comes today with her husband. Normally moves her bowels about every third day. Noticed more crampy abdominal pain and more watery diarrhea. This is happening over the last 3 months. CAT scan done showing bulky tumor and cecum. Colonoscopy confirmed mass that cecum/ascending colon. Near obstructing. Biopsy consistent with adenocarcinoma. Right ovary cyst adherent near it. Urgent surgical consultation requested. Patient walks with a walker. She's been having some issues of incontinence to feces. [name] wears a diaper for urinary incontinence. She had abdominal hysterectomy and cholecystectomy. No other surgeries. She does not smoke or drink. She needs a rolling walker for balance but can walk 15-20 minutes with that. She's had about 10 pounds of unintentional weight loss. Afraid to eat less she get crampy pain or diarrhea. Been tolerating supplemental shakes though. No fevers or sweats.  No personal nor family history of GI/colon cancer, inflammatory bowel disease, irritable bowel syndrome, allergy such as Celiac Sprue, dietary/dairy problems, colitis, ulcers nor gastritis. No recent sick contacts/gastroenteritis. No travel outside the country. No changes in diet. No dysphagia to solids or liquids. No significant heartburn or reflux. No hematochezia, hematemesis, coffee ground emesis. No evidence of prior gastric/peptic ulceration. No heart attacks or strokes. Not  anticoagulated. Not a diabetic. She does not smoke. She does have a chronic right eye droop and has been diagnosed with ocular myasthenia gravis. Followed by Dr. Posey Pronto with [facility] neurology. She's been intermittently on prednisone. Not able to fully taper off.  (Review of systems as stated in this history (HPI) or in the review of systems. 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 Baseline prednisone use. Followed by Dr. Posey Pronto with [facility] neurology  Past Surgical History (Tanisha A. Owens Shark, [place]; 03/10/2018 12:02 PM) Cataract Surgery Bilateral. Gallbladder Surgery - Laparoscopic Hemorrhoidectomy Hysterectomy (not due to cancer) - Complete Spinal Surgery - Lower Back Tonsillectomy  Diagnostic Studies History (Tanisha A. Owens Shark, [place]; 03/10/2018 12:02 PM) Colonoscopy within last year Mammogram >3 years ago Pap Smear >5 years ago  Allergies (Tanisha A. Owens Shark, [place]; 03/10/2018 12:03 PM) No Known Drug Allergies [03/10/2018]: Allergies Reconciled  Medication History (Tanisha A. Owens Shark, RMA; 03/10/2018 12:07 PM) Aspirin (81MG  Tablet, Oral) Active. Calcium (500MG  Tablet, Oral) Active. Vitamin D (1000UNIT Tablet, Oral) Active. Ferrous Sulfate (325 (65 Fe)MG Tablet, Oral) Active. Folic Acid (256LSL Tablet, Oral) Active. Latanoprost (0.005% Solution, Ophthalmic) Active. Imodium (2MG  Capsule, Oral) Active. Losartan Potassium-HCTZ (50-12.5MG  Tablet, Oral) Active. Multi-Vitamin (Oral) Active. Omega-3 (350MG  Capsule DR, Oral) Active. Omeprazole (40MG  Capsule DR, Oral) Active. Deltasone (5MG  Tablet, Oral) Active. Mestinon (60MG  Tablet, Oral) Active. Crestor (5MG  Tablet, Oral) Active. Zoloft (50MG  Tablet, Oral) Active. TraZODone HCl (50MG  Tablet, Oral) Active. Medications Reconciled  Social History (Tanisha A. Owens Shark, [place]; 03/10/2018  12:02 PM) Alcohol use Occasional alcohol use. No caffeine use No drug use Tobacco use Never smoker.  Family History (Tanisha A. Owens Shark, [place]; 03/10/2018 12:02 PM) Arthritis Mother. Breast Cancer Sister.  Diabetes Mellitus Father, Sister. Heart Disease Father. Respiratory Condition Brother.  Pregnancy / Birth History (Tanisha A. Owens Shark, [place]; 03/10/2018 12:02 PM) Age at menarche 89 years. Age of menopause 61-55 Gravida 3 Maternal age 35-30 Para 2  Other Problems Adin Hector, MD; 03/10/2018 12:53 PM) Anxiety Disorder Arthritis Back Pain Bladder Problems Colon Cancer Depression Melanoma Oophorectomy Bilateral.     Review of Systems (Tanisha A. Brown RMA; 03/10/2018 12:02 PM) General Present- Appetite Loss, Fatigue and Weight Loss. Not Present- Chills, Fever, Night Sweats and Weight Gain. HEENT Present- Wears glasses/contact lenses. Not Present- Earache, Hearing Loss, Hoarseness, Nose Bleed, Oral Ulcers, Ringing in the Ears, Seasonal Allergies, Sinus Pain, Sore Throat, Visual Disturbances and Yellow Eyes. Cardiovascular Present- Shortness of Breath. Not Present- Chest Pain, Difficulty Breathing Lying Down, Leg Cramps, Palpitations, Rapid Heart Rate and Swelling of Extremities. Gastrointestinal Present- Change in Bowel Habits and Chronic diarrhea. Not Present- Abdominal Pain, Bloating, Bloody Stool, Constipation, Difficulty Swallowing, Excessive gas, Gets full quickly at meals, Hemorrhoids, Indigestion, Nausea, Rectal Pain and Vomiting. Musculoskeletal Present- Back Pain and Muscle Weakness. Not Present- Joint Pain, Joint Stiffness, Muscle Pain and Swelling of Extremities. Neurological Present- Headaches. Not Present- Decreased Memory, Fainting, Numbness, Seizures, Tingling, Tremor, Trouble walking and Weakness. Psychiatric Present- Depression. Not Present- Anxiety, Bipolar, Change in Sleep Pattern, Fearful and Frequent crying. Endocrine Not Present- Cold  Intolerance, Excessive Hunger, Hair Changes, Heat Intolerance, Hot flashes and New Diabetes. Hematology Not Present- Blood Thinners, Easy Bruising, Excessive bleeding, Gland problems, HIV and Persistent Infections.  Vitals (Tanisha A. Brown RMA; 03/10/2018 12:03 PM) 03/10/2018 12:02 PM Weight: 140.8 lb Height: 63in Body Surface Area: 1.67 m Body Mass Index: 24.94 kg/m  Temp.: 98.58F  Pulse: 82 (Regular)  BP: 124/84 (Sitting, Left Arm, Standard)      Physical Exam Adin Hector MD; 03/10/2018 12:23 PM)  General Mental Status-Alert. General Appearance-Not in acute distress, Not Sickly. Orientation-Oriented X3. Hydration-Well hydrated. Voice-Normal.  Integumentary Global Assessment Upon inspection and palpation of skin surfaces of the - Axillae: non-tender, no inflammation or ulceration, no drainage. and Distribution of scalp and body hair is normal. General Characteristics Temperature - normal warmth is noted.  Head and Neck Head-normocephalic, atraumatic with no lesions or palpable masses. Face Global Assessment - atraumatic, no absence of expression. Neck Global Assessment - no abnormal movements, no bruit auscultated on the right, no bruit auscultated on the left, no decreased range of motion, non-tender. Trachea-midline. Thyroid Gland Characteristics - non-tender.  Eye Eyeball - Left-Extraocular movements intact, No Nystagmus. Eyeball - Right-Extraocular movements intact, No Nystagmus. Cornea - Left-No Hazy. Cornea - Right-No Hazy. Sclera/Conjunctiva - Left-No scleral icterus, No Discharge. Sclera/Conjunctiva - Right-No scleral icterus, No Discharge. Pupil - Left-Direct reaction to light normal. Pupil - Right-Direct reaction to light normal. Note: Right eyelid droop halfway down. Tells me she has a history of myasthenia on that side.  ENMT Ears Pinna - Left - no drainage observed, no generalized tenderness observed. Right  - no drainage observed, no generalized tenderness observed. Nose and Sinuses External Inspection of the Nose - no destructive lesion observed. Inspection of the nares - Left - quiet respiration. Right - quiet respiration. Mouth and Throat Lips - Upper Lip - no fissures observed, no pallor noted. Lower Lip - no fissures observed, no pallor noted. Nasopharynx - no discharge present. Oral Cavity/Oropharynx - Tongue - no dryness observed. Oral Mucosa - no cyanosis observed. Hypopharynx - no evidence of airway distress observed.  Chest and Lung Exam Inspection Movements - Normal  and Symmetrical. Accessory muscles - No use of accessory muscles in breathing. Palpation Palpation of the chest reveals - Non-tender. Auscultation Breath sounds - Normal and Clear.  Cardiovascular Auscultation Rhythm - Regular. Murmurs & Other Heart Sounds - Auscultation of the heart reveals - No Murmurs and No Systolic Clicks.  Abdomen Inspection Inspection of the abdomen reveals - No Visible peristalsis and No Abnormal pulsations. Umbilicus - No Bleeding, No Urine drainage. Palpation/Percussion Palpation and Percussion of the abdomen reveal - Soft, Non Tender, No Rebound tenderness, No Rigidity (guarding) and No Cutaneous hyperesthesia. Note: Abdomen soft. Not severely distended. No distasis recti. No umbilical or other anterior abdominal wall hernias  Female Genitourinary Sexual Maturity Tanner 5 - Adult hair pattern. Note: Pfannenstiel incision. No inguinal hernias. No vaginal bleeding nor discharge  Rectal Note: Right external hemorrhoid. Low normal but intact sphincter tone. No sphincter defects. No pruritus.  Peripheral Vascular Upper Extremity Inspection - Left - No Cyanotic nailbeds, Not Ischemic. Right - No Cyanotic nailbeds, Not Ischemic.  Neurologic Neurologic evaluation reveals -normal attention span and ability to concentrate, able to name objects and repeat phrases. Appropriate fund of  knowledge , normal sensation and normal coordination. Mental Status Affect - not angry, not paranoid. Cranial Nerves-Normal Bilaterally. Gait-Normal.  Neuropsychiatric Mental status exam performed with findings of-able to articulate well with normal speech/language, rate, volume and coherence, thought content normal with ability to perform basic computations and apply abstract reasoning and no evidence of hallucinations, delusions, obsessions or homicidal/suicidal ideation. Note: Mentally sharp. Joking. Follows commands easily. No evidence of any dementia or delirium.  Musculoskeletal Global Assessment Spine, Ribs and Pelvis - no instability, subluxation or laxity. Right Upper Extremity - no instability, subluxation or laxity.  Lymphatic Head & Neck  General Head & Neck Lymphatics: Bilateral - Description - No Localized lymphadenopathy. Axillary  General Axillary Region: Bilateral - Description - No Localized lymphadenopathy. Femoral & Inguinal  Generalized Femoral & Inguinal Lymphatics: Left - Description - No Localized lymphadenopathy. Right - Description - No Localized lymphadenopathy.    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 Most likely explaining the bowel habits of crampy abdominal pain and intermittent diarrhea.  While she is of advanced age, she is actually getting around okay and is very mentally sharp. I worried that if this is not operated upon, it will completely obstruct and cause much more severe problems with laparotomy and colostomy. Morbidity much higher.  She is near obstructing so we need to try and do this urgently ideally. Patient went from joking about getting nothing done to getting it done today.  The risks would be minimized if we can electively resect this. Minimally invasive approach. Ideally robotic resection with intracorporeal  anastomosis. En bloc right salpingo-oophorectomy since ovarian cyst nearby. Try minimize operative risk.  Ideally would like to get clearance from her cardiologist to see if she is at least optimized.  Suspect she'll need skilled nursing facility for rehabilitation to recover given her balance issues and dependence on walker and advanced age. He would like to avoid that. I strongly recommended nutrition and exercise to help minimize morbidity in other issues  Current Plans I recommended obtaining preoperative cardiac clearance. I am concerned about the health of the patient and the ability to tolerate the operation. Therefore, we will request clearance by cardiology to better assess operative risk & see if a reevaluation, further workup, etc is needed.  Also recommendations on how medications such as for anticoagulation and blood pressure should be managed/held/restarted after surgery. You are being scheduled for surgery- Our schedulers will call you.  You should hear from our office's scheduling department within 5 working days about the location, date, and time of surgery. We try to make accommodations for patient's preferences in scheduling surgery, but sometimes the OR schedule or the surgeon's schedule prevents [place] from making those accommodations.  If you have not heard from our office [phone]) in 5 working days, call the office and ask for your surgeon's nurse.  If you have other questions about your diagnosis, plan, or surgery, call the office and ask for your surgeon's nurse.  Written instructions provided  COLON OBSTRUCTION (K56.609) Impression: Partial colon obstruction from bulky tumor at cecum/ascending colon.  I think she will require surgery before this becomes completely obstructed.  Current Plans Pt Education - [facility] Good Bowel Health (Jasai Sorg)  RIGHT OVARIAN CYST (N83.201) Impression: Right ovarian cyst. CT simple but adherent to colon. Most likely will require  salpingo-oophorectomy on that side en bloc.   PREOP COLON - ENCOUNTER FOR PREOPERATIVE EXAMINATION FOR GENERAL SURGICAL PROCEDURE (X10.626)  Current Plans The anatomy & physiology of the digestive tract was discussed. The pathophysiology of the colon was discussed. Natural history risks without surgery was discussed. I feel the risks of no intervention will lead to serious problems that outweigh the operative risks; therefore, I recommended a partial colectomy to remove the pathology. Minimally invasive (Robotic/Laparoscopic) & open techniques were discussed.  Risks such as bleeding, infection, abscess, leak, reoperation, possible ostomy, hernia, heart attack, death, and other risks were discussed. I noted a good likelihood this will help address the problem. Goals of post-operative recovery were discussed as well. Need for adequate nutrition, daily bowel regimen and healthy physical activity, to optimize recovery was noted as well. We will work to minimize complications. Educational materials were available as well. Questions were answered. The patient expresses understanding & wishes to proceed with surgery.  Pt Education - [facility] Colon Bowel Prep 2018 ERAS/Miralax/Antibiotics Started Neomycin Sulfate 500 MG Oral Tablet, 2 (two) Tablet SEE NOTE, #6, 03/10/2018, No Refill. Local Order: TAKE TWO TABLETS AT 2 PM, 3 PM, AND 10 PM THE DAY PRIOR TO SURGERY Started Flagyl 500 MG Oral Tablet, 2 (two) Tablet SEE NOTE, #6, 03/10/2018, No Refill. Local Order: Take at 2pm, 3pm, and 10pm the day prior to your colon operation Pt Education - Pamphlet Given - Laparoscopic Colorectal Surgery: discussed with patient and provided information. Pt Education - [facility] Colectomy post-op instructions: discussed with patient and provided information.  Adin Hector, M.D., F.A.C.S. Gastrointestinal and Minimally Invasive Surgery [facility] [address], [place], [place]  [address] [phone] Main / Paging

## 2018-03-11 NOTE — Telephone Encounter (Signed)
Clearance sent by Dr. Acie Fredrickson to Dr. Johney Maine through epic

## 2018-03-11 NOTE — Telephone Encounter (Signed)
Clearance sent/received. Note will be removed from preop pool. Richardson Dopp, PA-C    03/11/2018 3:56 PM

## 2018-03-11 NOTE — Telephone Encounter (Signed)
Clearance has been received by Dr. Acie Fredrickson.

## 2018-03-11 NOTE — Telephone Encounter (Signed)
   Primary Cardiologist:Philip Nahser, MD  Notes indicate Dr. Acie Fredrickson faxed clearance already to requesting surgeon.  Please contact surgeon's office to ensure that fax was received.  Richardson Dopp, PA-C  03/11/2018, 3:40 PM

## 2018-03-12 NOTE — Patient Instructions (Addendum)
JERMIYAH RICOTTA  03/12/2018   Your procedure is scheduled on: 03-17-18   Report to [facility] Main  Entrance              Report to admitting at    1100 AM    Call this number if you have problems the morning of surgery [phone]                     Take these medicines the morning of surgery with A SIP OF WATER: mestinon, omeprazole                                You may not have any metal on your body including hair pins and              piercings  Do not wear jewelry, make-up, lotions, powders or perfumes, deodorant             Do not wear nail polish.  Do not shave  48 hours prior to surgery.               Do not bring valuables to the hospital. [place].  Contacts, dentures or bridgework may not be worn into surgery.  Leave suitcase in the car. After surgery it may be brought to your room.               Please read over the following fact sheets you were given: _____________________________________________________________________  FOLLOW BOWEL PREP PER MD  INSTRUCTIONS  FOLLOW A CLEAR LIQUID DIET THE DAY OF YOUR BOWEL PREP TO PREVENT DEHYDRATION  DRINK 2 PRESURGERY ENSURE DRINKS THE NIGHT BEFORE SURGERY AT  1000 PM AND 1 PRESURGERY DRINK THE DAY OF THE PROCEDURE 3 HOURS PRIOR TO SCHEDULED SURGERY. NO SOLIDS AFTER MIDNIGHT THE DAY PRIOR TO THE SURGERY. NOTHING BY MOUTH EXCEPT CLEAR LIQUIDS UNTIL THREE HOURS PRIOR TO SCHEDULED SURGERY. PLEASE FINISH PRESURGERY ENSURE DRINK PER SURGEON ORDER 3 HOURS PRIOR TO SCHEDULED SURGERY TIME WHICH NEEDS TO BE COMPLETED AT   1000  AM_______.    CLEAR LIQUID DIET   Foods Allowed                                                                     Foods Excluded  Coffee and tea, regular and decaf                             liquids that you cannot  Plain Jell-O in any flavor                                             see through such as: Fruit ices (not  with fruit pulp)  milk, soups, orange juice  Iced Popsicles                                    All solid food                                   Cranberry, grape and apple juices Sports drinks like Gatorade Lightly seasoned clear broth or consume(fat free) Sugar, honey syrup  Sample Menu Breakfast                                Lunch                                     Supper Cranberry juice                    Beef broth                            Chicken broth Jell-O                                     Grape juice                           Apple juice Coffee or tea                        Jell-O                                      Popsicle                                                Coffee or tea                        Coffee or tea  _____________________________________________________________________            [facility] - Preparing for Surgery Before surgery, you can play an important role.  Because skin is not sterile, your skin needs to be as free of germs as possible.  You can reduce the number of germs on your skin by washing with CHG (chlorahexidine gluconate) soap before surgery.  CHG is an antiseptic cleaner which kills germs and bonds with the skin to continue killing germs even after washing. Please DO NOT use if you have an allergy to CHG or antibacterial soaps.  If your skin becomes reddened/irritated stop using the CHG and inform your nurse when you arrive at Short Stay. Do not shave (including legs and underarms) for at least 48 hours prior to the first CHG shower.  You may shave your face/neck. Please follow these instructions carefully:  1.  Shower with CHG Soap the night before surgery and the  morning of Surgery.  2.  If  you choose to wash your hair, wash your hair first as usual with your  normal  shampoo.  3.  After you shampoo, rinse your hair and body thoroughly to remove the  shampoo.                           4.  Use CHG as  you would any other liquid soap.  You can apply chg directly  to the skin and wash                       Gently with a scrungie or clean washcloth.  5.  Apply the CHG Soap to your body ONLY FROM THE NECK DOWN.   Do not use on face/ open                           Wound or open sores. Avoid contact with eyes, ears mouth and genitals (private parts).                       Wash face,  Genitals (private parts) with your normal soap.             6.  Wash thoroughly, paying special attention to the area where your surgery  will be performed.  7.  Thoroughly rinse your body with warm water from the neck down.  8.  DO NOT shower/wash with your normal soap after using and rinsing off  the CHG Soap.                9.  Pat yourself dry with a clean towel.            10.  Wear clean pajamas.            11.  Place clean sheets on your bed the night of your first shower and do not  sleep with pets. Day of Surgery : Do not apply any lotions/deodorants the morning of surgery.  Please wear clean clothes to the hospital/surgery center.  FAILURE TO FOLLOW THESE INSTRUCTIONS MAY RESULT IN THE CANCELLATION OF YOUR SURGERY PATIENT SIGNATURE_________________________________  NURSE SIGNATURE__________________________________  ________________________________________________________________________  _____________________________________________________________________   WHAT IS A BLOOD TRANSFUSION? Blood Transfusion Information  A transfusion is the replacement of blood or some of its parts. Blood is made up of multiple cells which provide different functions.  Red blood cells carry oxygen and are used for blood loss replacement.  White blood cells fight against infection.  Platelets control bleeding.  Plasma helps clot blood.  Other blood products are available for specialized needs, such as hemophilia or other clotting disorders. BEFORE THE TRANSFUSION  Who gives blood for transfusions?   Healthy  volunteers who are fully evaluated to make sure their blood is safe. This is blood bank blood. Transfusion therapy is the safest it has ever been in the practice of medicine. Before blood is taken from a donor, a complete history is taken to make sure that person has no history of diseases nor engages in risky social behavior (examples are intravenous drug use or sexual activity with multiple partners). The donor's travel history is screened to minimize risk of transmitting infections, such as malaria. The donated blood is tested for signs of infectious diseases, such as HIV and hepatitis. The blood is then tested to be sure it is compatible with you in order to minimize the chance of  a transfusion reaction. If you or a relative donates blood, this is often done in anticipation of surgery and is not appropriate for emergency situations. It takes many days to process the donated blood. RISKS AND COMPLICATIONS Although transfusion therapy is very safe and saves many lives, the main dangers of transfusion include:   Getting an infectious disease.  Developing a transfusion reaction. This is an allergic reaction to something in the blood you were given. Every precaution is taken to prevent this. The decision to have a blood transfusion has been considered carefully by your caregiver before blood is given. Blood is not given unless the benefits outweigh the risks. AFTER THE TRANSFUSION  Right after receiving a blood transfusion, you will usually feel much better and more energetic. This is especially true if your red blood cells have gotten low (anemic). The transfusion raises the level of the red blood cells which carry oxygen, and this usually causes an energy increase.  The nurse administering the transfusion will monitor you carefully for complications. HOME CARE INSTRUCTIONS  No special instructions are needed after a transfusion. You may find your energy is better. Speak with your caregiver about any  limitations on activity for underlying diseases you may have. SEEK MEDICAL CARE IF:   Your condition is not improving after your transfusion.  You develop redness or irritation at the intravenous (IV) site. SEEK IMMEDIATE MEDICAL CARE IF:  Any of the following symptoms occur over the next 12 hours:  Shaking chills.  You have a temperature by mouth above 102 F (38.9 C), not controlled by medicine.  Chest, back, or muscle pain.  People around you feel you are not acting correctly or are confused.  Shortness of breath or difficulty breathing.  Dizziness and fainting.  You get a rash or develop hives.  You have a decrease in urine output.  Your urine turns a dark color or changes to pink, red, or brown. Any of the following symptoms occur over the next 10 days:  You have a temperature by mouth above 102 F (38.9 C), not controlled by medicine.  Shortness of breath.  Weakness after normal activity.  The white part of the eye turns yellow (jaundice).  You have a decrease in the amount of urine or are urinating less often.  Your urine turns a dark color or changes to pink, red, or brown. Document Released: 11/15/2000 Document Revised: 02/10/2012 Document Reviewed: 07/04/2008 Exit[employer] Patient Information 2014 [place].  _______________________________________________________________________  Incentive Spirometer  An incentive spirometer is a tool that can help keep your lungs clear and active. This tool measures how well you are filling your lungs with each breath. Taking long deep breaths may help reverse or decrease the chance of developing breathing (pulmonary) problems (especially infection) following:  A long period of time when you are unable to move or be active. BEFORE THE PROCEDURE   If the spirometer includes an indicator to show your best effort, your nurse or respiratory therapist will set it to a desired goal.  If possible, sit up straight or lean  slightly forward. Try not to slouch.  Hold the incentive spirometer in an upright position. INSTRUCTIONS FOR USE  1. Sit on the edge of your bed if possible, or sit up as far as you can in bed or on a chair. 2. Hold the incentive spirometer in an upright position. 3. Breathe out normally. 4. Place the mouthpiece in your mouth and seal your lips tightly around it. 5. Breathe in  slowly and as deeply as possible, raising the piston or the ball toward the top of the column. 6. Hold your breath for 3-5 seconds or for as long as possible. Allow the piston or ball to fall to the bottom of the column. 7. Remove the mouthpiece from your mouth and breathe out normally. 8. Rest for a few seconds and repeat Steps 1 through 7 at least 10 times every 1-2 hours when you are awake. Take your time and take a few normal breaths between deep breaths. 9. The spirometer may include an indicator to show your best effort. Use the indicator as a goal to work toward during each repetition. 10. After each set of 10 deep breaths, practice coughing to be sure your lungs are clear. If you have an incision (the cut made at the time of surgery), support your incision when coughing by placing a pillow or rolled up towels firmly against it. Once you are able to get out of bed, walk around indoors and cough well. You may stop using the incentive spirometer when instructed by your caregiver.  RISKS AND COMPLICATIONS  Take your time so you do not get dizzy or light-headed.  If you are in pain, you may need to take or ask for pain medication before doing incentive spirometry. It is harder to take a deep breath if you are having pain. AFTER USE  Rest and breathe slowly and easily.  It can be helpful to keep track of a log of your progress. Your caregiver can provide you with a simple table to help with this. If you are using the spirometer at home, follow these instructions: [place] IF:   You are having difficultly  using the spirometer.  You have trouble using the spirometer as often as instructed.  Your pain medication is not giving enough relief while using the spirometer.  You develop fever of 100.5 F (38.1 C) or higher. SEEK IMMEDIATE MEDICAL CARE IF:   You cough up bloody sputum that had not been present before.  You develop fever of 102 F (38.9 C) or greater.  You develop worsening pain at or near the incision site. MAKE SURE YOU:   Understand these instructions.  Will watch your condition.  Will get help right away if you are not doing well or get worse. Document Released: 03/31/2007 Document Revised: 02/10/2012 Document Reviewed: 06/01/2007 [facility] Patient Information 2014 [place], [place].   ________________________________________________________________________

## 2018-03-13 ENCOUNTER — Encounter (HOSPITAL_COMMUNITY)
Admission: RE | Admit: 2018-03-13 | Discharge: 2018-03-13 | Disposition: A | Discharge status: Home / Self Care | Payer: Medicare Other | Source: Ambulatory Visit | Attending: Surgery | Admitting: Surgery

## 2018-03-13 ENCOUNTER — Encounter (HOSPITAL_COMMUNITY): Payer: Self-pay

## 2018-03-13 ENCOUNTER — Other Ambulatory Visit: Payer: Self-pay

## 2018-03-13 DIAGNOSIS — C180 Malignant neoplasm of cecum: Secondary | ICD-10-CM | POA: Diagnosis not present

## 2018-03-13 DIAGNOSIS — Z01812 Encounter for preprocedural laboratory examination: Secondary | ICD-10-CM | POA: Diagnosis not present

## 2018-03-13 HISTORY — DX: Pneumonia, unspecified organism: J18.9

## 2018-03-13 HISTORY — DX: Personal history of urinary calculi: Z87.442

## 2018-03-13 LAB — BASIC METABOLIC PANEL
Anion gap: 10 (ref 5–15)
BUN: 18 mg/dL (ref 6–20)
CHLORIDE: 106 mmol/L (ref 101–111)
CO2: 26 mmol/L (ref 22–32)
Calcium: 9.6 mg/dL (ref 8.9–10.3)
Creatinine, Ser: 0.75 mg/dL (ref 0.44–1.00)
GFR calc Af Amer: >60 mL/min (ref >60)
GFR calc non Af Amer: >60 mL/min (ref >60)
GLUCOSE: 137 mg/dL — AB (ref 65–99)
POTASSIUM: 4.3 mmol/L (ref 3.5–5.1)
SODIUM: 142 mmol/L (ref 135–145)

## 2018-03-13 LAB — CBC
HCT: 31.1 % — ABNORMAL LOW (ref 36.0–46.0)
HEMOGLOBIN: 9.3 g/dL — AB (ref 12.0–15.0)
MCH: 27.4 pg (ref 26.0–34.0)
MCHC: 29.9 g/dL — ABNORMAL LOW (ref 30.0–36.0)
MCV: 91.5 fL (ref 78.0–100.0)
Platelets: 302 10*3/uL (ref 150–400)
RBC: 3.4 MIL/uL — ABNORMAL LOW (ref 3.87–5.11)
RDW: 20 % — ABNORMAL HIGH (ref 11.5–15.5)
WBC: 8.1 10*3/uL (ref 4.0–10.5)

## 2018-03-13 LAB — HEMOGLOBIN A1C
Hgb A1c MFr Bld: 5.6 % (ref 4.8–5.6)
Mean Plasma Glucose: 114.02 mg/dL

## 2018-03-13 NOTE — Progress Notes (Signed)
Cbc done 03-13-18 routed to Dr. Johney [place] via epic

## 2018-03-13 NOTE — Progress Notes (Signed)
Clearance Dr. Cathie Olden chart 03-11-18  ekg 11-19-17 epic   cbc/ciff, cmp 03-04-18 epic ct chest 03-06-18 epic

## 2018-03-16 ENCOUNTER — Other Ambulatory Visit (HOSPITAL_COMMUNITY): Payer: Medicare Other

## 2018-03-16 MED ORDER — BUPIVACAINE LIPOSOME 1.3 % IJ SUSP
20.0000 mL | INTRAMUSCULAR | Status: DC
  Filled 2018-03-16: qty 20

## 2018-03-17 ENCOUNTER — Inpatient Hospital Stay (HOSPITAL_COMMUNITY): Payer: Medicare Other | Admitting: Certified Registered Nurse Anesthetist

## 2018-03-17 ENCOUNTER — Inpatient Hospital Stay (HOSPITAL_COMMUNITY)
Admission: RE | Admit: 2018-03-17 | Discharge: 2018-03-20 | DRG: 331 | Disposition: A | Discharge status: Home / Self Care | Payer: Medicare Other | Source: Ambulatory Visit | Attending: Surgery | Admitting: Surgery

## 2018-03-17 ENCOUNTER — Encounter (HOSPITAL_COMMUNITY)
Admission: RE | Disposition: A | Discharge status: Home / Self Care | Payer: Self-pay | Source: Ambulatory Visit | Attending: Surgery

## 2018-03-17 ENCOUNTER — Encounter (HOSPITAL_COMMUNITY): Payer: Self-pay | Admitting: *Deleted

## 2018-03-17 ENCOUNTER — Other Ambulatory Visit: Payer: Self-pay

## 2018-03-17 DIAGNOSIS — D649 Anemia, unspecified: Secondary | ICD-10-CM | POA: Diagnosis present

## 2018-03-17 DIAGNOSIS — M4800 Spinal stenosis, site unspecified: Secondary | ICD-10-CM | POA: Diagnosis present

## 2018-03-17 DIAGNOSIS — E876 Hypokalemia: Secondary | ICD-10-CM | POA: Diagnosis not present

## 2018-03-17 DIAGNOSIS — R634 Abnormal weight loss: Secondary | ICD-10-CM | POA: Diagnosis present

## 2018-03-17 DIAGNOSIS — G4700 Insomnia, unspecified: Secondary | ICD-10-CM | POA: Diagnosis present

## 2018-03-17 DIAGNOSIS — H3530 Unspecified macular degeneration: Secondary | ICD-10-CM | POA: Diagnosis present

## 2018-03-17 DIAGNOSIS — Z90710 Acquired absence of both cervix and uterus: Secondary | ICD-10-CM

## 2018-03-17 DIAGNOSIS — G7000 Myasthenia gravis without (acute) exacerbation: Secondary | ICD-10-CM | POA: Diagnosis present

## 2018-03-17 DIAGNOSIS — Z85828 Personal history of other malignant neoplasm of skin: Secondary | ICD-10-CM

## 2018-03-17 DIAGNOSIS — K219 Gastro-esophageal reflux disease without esophagitis: Secondary | ICD-10-CM | POA: Diagnosis present

## 2018-03-17 DIAGNOSIS — Z969 Presence of functional implant, unspecified: Secondary | ICD-10-CM | POA: Diagnosis present

## 2018-03-17 DIAGNOSIS — N3946 Mixed incontinence: Secondary | ICD-10-CM | POA: Diagnosis present

## 2018-03-17 DIAGNOSIS — E785 Hyperlipidemia, unspecified: Secondary | ICD-10-CM | POA: Diagnosis present

## 2018-03-17 DIAGNOSIS — R531 Weakness: Secondary | ICD-10-CM | POA: Diagnosis present

## 2018-03-17 DIAGNOSIS — G709 Myoneural disorder, unspecified: Secondary | ICD-10-CM | POA: Diagnosis present

## 2018-03-17 DIAGNOSIS — K449 Diaphragmatic hernia without obstruction or gangrene: Secondary | ICD-10-CM | POA: Diagnosis present

## 2018-03-17 DIAGNOSIS — C4492 Squamous cell carcinoma of skin, unspecified: Secondary | ICD-10-CM

## 2018-03-17 DIAGNOSIS — I119 Hypertensive heart disease without heart failure: Secondary | ICD-10-CM | POA: Diagnosis present

## 2018-03-17 DIAGNOSIS — I10 Essential (primary) hypertension: Secondary | ICD-10-CM | POA: Diagnosis present

## 2018-03-17 DIAGNOSIS — N3941 Urge incontinence: Secondary | ICD-10-CM | POA: Diagnosis present

## 2018-03-17 DIAGNOSIS — F329 Major depressive disorder, single episode, unspecified: Secondary | ICD-10-CM | POA: Diagnosis present

## 2018-03-17 DIAGNOSIS — F32A Depression, unspecified: Secondary | ICD-10-CM | POA: Diagnosis present

## 2018-03-17 DIAGNOSIS — Z8679 Personal history of other diseases of the circulatory system: Secondary | ICD-10-CM

## 2018-03-17 DIAGNOSIS — M48061 Spinal stenosis, lumbar region without neurogenic claudication: Secondary | ICD-10-CM | POA: Diagnosis present

## 2018-03-17 DIAGNOSIS — Z79899 Other long term (current) drug therapy: Secondary | ICD-10-CM

## 2018-03-17 DIAGNOSIS — I739 Peripheral vascular disease, unspecified: Secondary | ICD-10-CM | POA: Diagnosis present

## 2018-03-17 DIAGNOSIS — M1990 Unspecified osteoarthritis, unspecified site: Secondary | ICD-10-CM | POA: Diagnosis present

## 2018-03-17 DIAGNOSIS — R402414 Glasgow coma scale score 13-15, 24 hours or more after hospital admission: Secondary | ICD-10-CM | POA: Diagnosis not present

## 2018-03-17 DIAGNOSIS — F419 Anxiety disorder, unspecified: Secondary | ICD-10-CM | POA: Diagnosis present

## 2018-03-17 DIAGNOSIS — L821 Other seborrheic keratosis: Secondary | ICD-10-CM | POA: Diagnosis present

## 2018-03-17 DIAGNOSIS — M549 Dorsalgia, unspecified: Secondary | ICD-10-CM | POA: Diagnosis present

## 2018-03-17 DIAGNOSIS — Z9049 Acquired absence of other specified parts of digestive tract: Secondary | ICD-10-CM

## 2018-03-17 DIAGNOSIS — C180 Malignant neoplasm of cecum: Principal | ICD-10-CM | POA: Diagnosis present

## 2018-03-17 DIAGNOSIS — R159 Full incontinence of feces: Secondary | ICD-10-CM | POA: Diagnosis present

## 2018-03-17 DIAGNOSIS — H409 Unspecified glaucoma: Secondary | ICD-10-CM | POA: Diagnosis present

## 2018-03-17 DIAGNOSIS — G8929 Other chronic pain: Secondary | ICD-10-CM | POA: Diagnosis present

## 2018-03-17 DIAGNOSIS — Z7952 Long term (current) use of systemic steroids: Secondary | ICD-10-CM

## 2018-03-17 DIAGNOSIS — H9190 Unspecified hearing loss, unspecified ear: Secondary | ICD-10-CM | POA: Diagnosis present

## 2018-03-17 DIAGNOSIS — R51 Headache: Secondary | ICD-10-CM | POA: Diagnosis present

## 2018-03-17 DIAGNOSIS — E7800 Pure hypercholesterolemia, unspecified: Secondary | ICD-10-CM | POA: Diagnosis present

## 2018-03-17 DIAGNOSIS — E559 Vitamin D deficiency, unspecified: Secondary | ICD-10-CM | POA: Diagnosis present

## 2018-03-17 DIAGNOSIS — N83201 Unspecified ovarian cyst, right side: Secondary | ICD-10-CM | POA: Diagnosis present

## 2018-03-17 DIAGNOSIS — M48062 Spinal stenosis, lumbar region with neurogenic claudication: Secondary | ICD-10-CM

## 2018-03-17 DIAGNOSIS — Z7982 Long term (current) use of aspirin: Secondary | ICD-10-CM

## 2018-03-17 LAB — TYPE AND SCREEN
ABO/RH(D): AB POS
Antibody Screen: NEGATIVE

## 2018-03-17 LAB — GLUCOSE, CAPILLARY: Glucose-Capillary: 154 mg/dL — ABNORMAL HIGH (ref 65–99)

## 2018-03-17 SURGERY — COLECTOMY, PARTIAL, ROBOT-ASSISTED, LAPAROSCOPIC
Anesthesia: General | Site: Abdomen | Laterality: Right

## 2018-03-17 MED ORDER — DIPHENHYDRAMINE HCL 12.5 MG/5ML PO ELIX
12.5000 mg | ORAL_SOLUTION | Freq: Four times a day (QID) | ORAL | Status: DC | PRN
  Administered 2018-03-18: 12.5 mg via ORAL
  Filled 2018-03-17: qty 5

## 2018-03-17 MED ORDER — LATANOPROST 0.005 % OP SOLN
1.0000 [drp] | Freq: Every day | OPHTHALMIC | Status: DC
  Administered 2018-03-17 – 2018-03-19 (×3): 1 [drp] via OPHTHALMIC
  Filled 2018-03-17: qty 2.5

## 2018-03-17 MED ORDER — FLUTICASONE PROPIONATE 50 MCG/ACT NA SUSP
1.0000 | Freq: Every day | NASAL | Status: DC
  Administered 2018-03-17 – 2018-03-19 (×3): 1 via NASAL
  Filled 2018-03-17: qty 16

## 2018-03-17 MED ORDER — MAGIC MOUTHWASH
15.0000 mL | Freq: Four times a day (QID) | ORAL | Status: DC | PRN
  Filled 2018-03-17: qty 15

## 2018-03-17 MED ORDER — LACTATED RINGERS IV BOLUS: 1000.0000 mL | Freq: Three times a day (TID) | INTRAVENOUS | Status: AC | PRN

## 2018-03-17 MED ORDER — SODIUM CHLORIDE 0.9 % IV SOLN: 1.0000 "application " | INTRAVENOUS | Status: DC

## 2018-03-17 MED ORDER — SUCCINYLCHOLINE CHLORIDE 20 MG/ML IJ SOLN
INTRAMUSCULAR | Status: DC | PRN
  Administered 2018-03-17: 60 mg via INTRAVENOUS

## 2018-03-17 MED ORDER — PYRIDOSTIGMINE BROMIDE 60 MG PO TABS
30.0000 mg | ORAL_TABLET | Freq: Two times a day (BID) | ORAL | Status: DC
  Administered 2018-03-18 – 2018-03-20 (×5): 30 mg via ORAL
  Filled 2018-03-17 (×6): qty 0.5

## 2018-03-17 MED ORDER — ENSURE SURGERY PO LIQD
237.0000 mL | Freq: Two times a day (BID) | ORAL | Status: DC
  Administered 2018-03-18 – 2018-03-20 (×5): 237 mL via ORAL
  Filled 2018-03-17 (×6): qty 237

## 2018-03-17 MED ORDER — NEOMYCIN SULFATE 500 MG PO TABS
1000.0000 mg | ORAL_TABLET | ORAL | Status: DC
  Administered 2018-03-17: 1000 mg via ORAL

## 2018-03-17 MED ORDER — CEFOTETAN DISODIUM-DEXTROSE 2-2.08 GM-%(50ML) IV SOLR
2.0000 g | INTRAVENOUS | Status: DC
  Filled 2018-03-17: qty 50

## 2018-03-17 MED ORDER — SUGAMMADEX SODIUM 200 MG/2ML IV SOLN
INTRAVENOUS | Status: DC | PRN
  Administered 2018-03-17: 150 mg via INTRAVENOUS

## 2018-03-17 MED ORDER — ACETAMINOPHEN 500 MG PO TABS
1000.0000 mg | ORAL_TABLET | ORAL | Status: AC
  Administered 2018-03-17: 1000 mg via ORAL
  Filled 2018-03-17: qty 2

## 2018-03-17 MED ORDER — ONDANSETRON HCL 4 MG/2ML IJ SOLN
INTRAMUSCULAR | Status: DC | PRN
  Administered 2018-03-17: 4 mg via INTRAVENOUS

## 2018-03-17 MED ORDER — KETAMINE HCL 10 MG/ML IJ SOLN
INTRAMUSCULAR | Status: DC | PRN
  Administered 2018-03-17: 20 mg via INTRAVENOUS

## 2018-03-17 MED ORDER — KETAMINE HCL 10 MG/ML IJ SOLN
INTRAMUSCULAR | Status: AC
  Filled 2018-03-17: qty 1

## 2018-03-17 MED ORDER — LABETALOL HCL 5 MG/ML IV SOLN
10.0000 mg | Freq: Once | INTRAVENOUS | Status: AC
  Administered 2018-03-17: 10 mg via INTRAVENOUS

## 2018-03-17 MED ORDER — LIDOCAINE 2% (20 MG/ML) 5 ML SYRINGE
INTRAMUSCULAR | Status: DC | PRN
  Administered 2018-03-17: 2.5 mL/h via INTRAVENOUS

## 2018-03-17 MED ORDER — TRAZODONE HCL 50 MG PO TABS
25.0000 mg | ORAL_TABLET | Freq: Every evening | ORAL | Status: DC | PRN
  Administered 2018-03-17 – 2018-03-19 (×3): 50 mg via ORAL
  Filled 2018-03-17 (×3): qty 1

## 2018-03-17 MED ORDER — ROCURONIUM BROMIDE 10 MG/ML (PF) SYRINGE
PREFILLED_SYRINGE | INTRAVENOUS | Status: DC | PRN
  Administered 2018-03-17 (×2): 10 mg via INTRAVENOUS
  Administered 2018-03-17: 40 mg via INTRAVENOUS

## 2018-03-17 MED ORDER — LIDOCAINE 2% (20 MG/ML) 5 ML SYRINGE
INTRAMUSCULAR | Status: AC
  Filled 2018-03-17: qty 10

## 2018-03-17 MED ORDER — BUPIVACAINE LIPOSOME 1.3 % IJ SUSP
INTRAMUSCULAR | Status: DC | PRN
  Administered 2018-03-17: 20 mL

## 2018-03-17 MED ORDER — ALVIMOPAN 12 MG PO CAPS
12.0000 mg | ORAL_CAPSULE | Freq: Two times a day (BID) | ORAL | Status: DC
  Administered 2018-03-18: 12 mg via ORAL
  Filled 2018-03-17 (×2): qty 1

## 2018-03-17 MED ORDER — OXYCODONE HCL 5 MG PO TABS: 5.0000 mg | ORAL_TABLET | Freq: Once | ORAL | Status: DC | PRN

## 2018-03-17 MED ORDER — PANTOPRAZOLE SODIUM 40 MG PO TBEC
40.0000 mg | DELAYED_RELEASE_TABLET | Freq: Every day | ORAL | Status: DC
  Administered 2018-03-18 – 2018-03-20 (×3): 40 mg via ORAL
  Filled 2018-03-17 (×3): qty 1

## 2018-03-17 MED ORDER — OXYCODONE HCL 5 MG/5ML PO SOLN
5.0000 mg | Freq: Once | ORAL | Status: DC | PRN
  Filled 2018-03-17: qty 5

## 2018-03-17 MED ORDER — LABETALOL HCL 5 MG/ML IV SOLN
INTRAVENOUS | Status: AC
  Filled 2018-03-17: qty 4

## 2018-03-17 MED ORDER — ROSUVASTATIN CALCIUM 5 MG PO TABS
5.0000 mg | ORAL_TABLET | Freq: Every day | ORAL | Status: DC
  Administered 2018-03-17 – 2018-03-20 (×4): 5 mg via ORAL
  Filled 2018-03-17 (×4): qty 1

## 2018-03-17 MED ORDER — HYDROCORTISONE 2.5 % RE CREA
1.0000 "application " | TOPICAL_CREAM | Freq: Four times a day (QID) | RECTAL | Status: DC | PRN
  Filled 2018-03-17: qty 28.35

## 2018-03-17 MED ORDER — ALVIMOPAN 12 MG PO CAPS
12.0000 mg | ORAL_CAPSULE | ORAL | Status: AC
  Administered 2018-03-17: 12 mg via ORAL
  Filled 2018-03-17: qty 1

## 2018-03-17 MED ORDER — SODIUM CHLORIDE 0.9 % IV SOLN
INTRAVENOUS | Status: DC | PRN
  Administered 2018-03-17: 1000 mL

## 2018-03-17 MED ORDER — SUCCINYLCHOLINE CHLORIDE 200 MG/10ML IV SOSY
PREFILLED_SYRINGE | INTRAVENOUS | Status: AC
  Filled 2018-03-17: qty 20

## 2018-03-17 MED ORDER — GABAPENTIN 300 MG PO CAPS
300.0000 mg | ORAL_CAPSULE | ORAL | Status: AC
  Administered 2018-03-17: 300 mg via ORAL
  Filled 2018-03-17: qty 1

## 2018-03-17 MED ORDER — BUPIVACAINE HCL (PF) 0.25 % IJ SOLN
INTRAMUSCULAR | Status: AC
  Filled 2018-03-17: qty 60

## 2018-03-17 MED ORDER — PROPOFOL 10 MG/ML IV BOLUS
INTRAVENOUS | Status: DC | PRN
  Administered 2018-03-17: 80 mg via INTRAVENOUS

## 2018-03-17 MED ORDER — DEXAMETHASONE SODIUM PHOSPHATE 10 MG/ML IJ SOLN
INTRAMUSCULAR | Status: DC | PRN
  Administered 2018-03-17: 5 mg via INTRAVENOUS

## 2018-03-17 MED ORDER — LACTATED RINGERS IV SOLN
INTRAVENOUS | Status: AC
  Administered 2018-03-17: 19:00:00 via INTRAVENOUS

## 2018-03-17 MED ORDER — PHENOL 1.4 % MT LIQD
1.0000 | OROMUCOSAL | Status: DC | PRN
  Filled 2018-03-17: qty 177

## 2018-03-17 MED ORDER — DIPHENHYDRAMINE HCL 50 MG/ML IJ SOLN: 12.5000 mg | Freq: Four times a day (QID) | INTRAMUSCULAR | Status: DC | PRN

## 2018-03-17 MED ORDER — LIP MEDEX EX OINT
1.0000 "application " | TOPICAL_OINTMENT | Freq: Two times a day (BID) | CUTANEOUS | Status: DC
  Administered 2018-03-18 – 2018-03-19 (×4): 1 via TOPICAL
  Filled 2018-03-17 (×2): qty 7

## 2018-03-17 MED ORDER — ENOXAPARIN SODIUM 40 MG/0.4ML ~~LOC~~ SOLN
40.0000 mg | Freq: Once | SUBCUTANEOUS | Status: AC
  Administered 2018-03-17: 40 mg via SUBCUTANEOUS
  Filled 2018-03-17: qty 0.4

## 2018-03-17 MED ORDER — GABAPENTIN 300 MG PO CAPS
300.0000 mg | ORAL_CAPSULE | Freq: Two times a day (BID) | ORAL | Status: DC
  Administered 2018-03-17 – 2018-03-20 (×6): 300 mg via ORAL
  Filled 2018-03-17 (×6): qty 1

## 2018-03-17 MED ORDER — FENTANYL CITRATE (PF) 100 MCG/2ML IJ SOLN
INTRAMUSCULAR | Status: DC | PRN
  Administered 2018-03-17 (×5): 50 ug via INTRAVENOUS

## 2018-03-17 MED ORDER — PREDNISONE 10 MG PO TABS
10.0000 mg | ORAL_TABLET | Freq: Every day | ORAL | Status: DC
  Administered 2018-03-18 – 2018-03-20 (×3): 10 mg via ORAL
  Filled 2018-03-17 (×3): qty 1

## 2018-03-17 MED ORDER — FENTANYL CITRATE (PF) 100 MCG/2ML IJ SOLN: 25.0000 ug | INTRAMUSCULAR | Status: DC | PRN

## 2018-03-17 MED ORDER — SODIUM CHLORIDE 0.9 % IV SOLN
2.0000 g | Freq: Two times a day (BID) | INTRAVENOUS | Status: AC
  Administered 2018-03-17: 2 g via INTRAVENOUS
  Filled 2018-03-17 (×2): qty 2

## 2018-03-17 MED ORDER — ALUM & MAG HYDROXIDE-SIMETH 200-200-20 MG/5ML PO SUSP
30.0000 mL | Freq: Four times a day (QID) | ORAL | Status: DC | PRN
Start: 2018-03-17 — End: 2018-03-18

## 2018-03-17 MED ORDER — FENTANYL CITRATE (PF) 250 MCG/5ML IJ SOLN
INTRAMUSCULAR | Status: AC
  Filled 2018-03-17: qty 5

## 2018-03-17 MED ORDER — SODIUM CHLORIDE 0.9 % IV SOLN
INTRAVENOUS | Status: AC
  Filled 2018-03-17: qty 6

## 2018-03-17 MED ORDER — TRAMADOL HCL 50 MG PO TABS
50.0000 mg | ORAL_TABLET | Freq: Four times a day (QID) | ORAL | Status: DC | PRN
  Administered 2018-03-18: 100 mg via ORAL
  Administered 2018-03-18 – 2018-03-20 (×3): 50 mg via ORAL
  Filled 2018-03-17 (×2): qty 1
  Filled 2018-03-17: qty 2
  Filled 2018-03-17: qty 1

## 2018-03-17 MED ORDER — 0.9 % SODIUM CHLORIDE (POUR BTL) OPTIME
TOPICAL | Status: DC | PRN
  Administered 2018-03-17: 2000 mL

## 2018-03-17 MED ORDER — BUPIVACAINE HCL (PF) 0.25 % IJ SOLN
INTRAMUSCULAR | Status: DC | PRN
  Administered 2018-03-17: 30 mL

## 2018-03-17 MED ORDER — ONDANSETRON HCL 4 MG/2ML IJ SOLN: 4.0000 mg | Freq: Four times a day (QID) | INTRAMUSCULAR | Status: DC | PRN

## 2018-03-17 MED ORDER — SODIUM CHLORIDE 0.9 % IV SOLN
2.0000 g | INTRAVENOUS | Status: AC
  Administered 2018-03-17: 2 g via INTRAVENOUS
  Filled 2018-03-17 (×2): qty 2

## 2018-03-17 MED ORDER — TRAMADOL HCL 50 MG PO TABS: 50.0000 mg | ORAL_TABLET | Freq: Four times a day (QID) | ORAL | 0 refills | Status: DC | PRN

## 2018-03-17 MED ORDER — HYDRALAZINE HCL 20 MG/ML IJ SOLN: 10.0000 mg | INTRAMUSCULAR | Status: DC | PRN

## 2018-03-17 MED ORDER — PHENYLEPHRINE 40 MCG/ML (10ML) SYRINGE FOR IV PUSH (FOR BLOOD PRESSURE SUPPORT)
PREFILLED_SYRINGE | INTRAVENOUS | Status: AC
  Filled 2018-03-17: qty 10

## 2018-03-17 MED ORDER — PROSIGHT PO TABS
1.0000 | ORAL_TABLET | Freq: Two times a day (BID) | ORAL | Status: DC
  Administered 2018-03-17 – 2018-03-20 (×6): 1 via ORAL
  Filled 2018-03-17 (×6): qty 1

## 2018-03-17 MED ORDER — ROCURONIUM BROMIDE 10 MG/ML (PF) SYRINGE
PREFILLED_SYRINGE | INTRAVENOUS | Status: AC
  Filled 2018-03-17: qty 15

## 2018-03-17 MED ORDER — PROPOFOL 10 MG/ML IV BOLUS
INTRAVENOUS | Status: AC
  Filled 2018-03-17: qty 20

## 2018-03-17 MED ORDER — MENTHOL 3 MG MT LOZG: 1.0000 | LOZENGE | OROMUCOSAL | Status: DC | PRN

## 2018-03-17 MED ORDER — ACETAMINOPHEN 500 MG PO TABS
1000.0000 mg | ORAL_TABLET | Freq: Three times a day (TID) | ORAL | Status: DC
  Administered 2018-03-17 – 2018-03-20 (×8): 1000 mg via ORAL
  Filled 2018-03-17 (×8): qty 2

## 2018-03-17 MED ORDER — PROCHLORPERAZINE MALEATE 10 MG PO TABS: 10.0000 mg | ORAL_TABLET | Freq: Four times a day (QID) | ORAL | Status: DC | PRN

## 2018-03-17 MED ORDER — METOPROLOL TARTRATE 5 MG/5ML IV SOLN
5.0000 mg | Freq: Four times a day (QID) | INTRAVENOUS | Status: DC | PRN
  Administered 2018-03-17: 5 mg via INTRAVENOUS
  Filled 2018-03-17: qty 5

## 2018-03-17 MED ORDER — BISACODYL 5 MG PO TBEC
20.0000 mg | DELAYED_RELEASE_TABLET | Freq: Once | ORAL | Status: DC
  Filled 2018-03-17: qty 4

## 2018-03-17 MED ORDER — ALUM & MAG HYDROXIDE-SIMETH 200-200-20 MG/5ML PO SUSP: 30.0000 mL | Freq: Four times a day (QID) | ORAL | Status: DC | PRN

## 2018-03-17 MED ORDER — LACTATED RINGERS IV SOLN
INTRAVENOUS | Status: DC
  Administered 2018-03-17 (×3): via INTRAVENOUS

## 2018-03-17 MED ORDER — SERTRALINE HCL 50 MG PO TABS
50.0000 mg | ORAL_TABLET | Freq: Every day | ORAL | Status: DC
  Administered 2018-03-17 – 2018-03-19 (×3): 50 mg via ORAL
  Filled 2018-03-17 (×3): qty 1

## 2018-03-17 MED ORDER — BOOST / RESOURCE BREEZE PO LIQD CUSTOM
1.0000 | Freq: Three times a day (TID) | ORAL | Status: DC
Start: 2018-03-17 — End: 2018-03-19
  Administered 2018-03-17 – 2018-03-18 (×4): 1 via ORAL

## 2018-03-17 MED ORDER — METOCLOPRAMIDE HCL 5 MG/ML IJ SOLN: 10.0000 mg | Freq: Four times a day (QID) | INTRAMUSCULAR | Status: DC | PRN

## 2018-03-17 MED ORDER — LIDOCAINE 2% (20 MG/ML) 5 ML SYRINGE
INTRAMUSCULAR | Status: DC | PRN
  Administered 2018-03-17: 60 mg via INTRAVENOUS

## 2018-03-17 MED ORDER — HYDROMORPHONE HCL 1 MG/ML IJ SOLN
0.5000 mg | INTRAMUSCULAR | Status: DC | PRN
  Administered 2018-03-18: 1 mg via INTRAVENOUS
  Filled 2018-03-17 (×2): qty 1

## 2018-03-17 MED ORDER — SACCHAROMYCES BOULARDII 250 MG PO CAPS
250.0000 mg | ORAL_CAPSULE | Freq: Two times a day (BID) | ORAL | Status: DC
  Administered 2018-03-17 – 2018-03-18 (×3): 250 mg via ORAL
  Filled 2018-03-17 (×3): qty 1

## 2018-03-17 MED ORDER — HYDROCORTISONE 1 % EX CREA
1.0000 "application " | TOPICAL_CREAM | Freq: Three times a day (TID) | CUTANEOUS | Status: DC | PRN
  Filled 2018-03-17: qty 28

## 2018-03-17 MED ORDER — ENOXAPARIN SODIUM 40 MG/0.4ML ~~LOC~~ SOLN
40.0000 mg | SUBCUTANEOUS | Status: DC
  Administered 2018-03-18 – 2018-03-20 (×3): 40 mg via SUBCUTANEOUS
  Filled 2018-03-17 (×4): qty 0.4

## 2018-03-17 MED ORDER — PROCHLORPERAZINE EDISYLATE 5 MG/ML IJ SOLN: 5.0000 mg | Freq: Four times a day (QID) | INTRAMUSCULAR | Status: DC | PRN

## 2018-03-17 MED ORDER — METRONIDAZOLE 500 MG PO TABS
1000.0000 mg | ORAL_TABLET | ORAL | Status: DC
  Administered 2018-03-17: 1000 mg via ORAL

## 2018-03-17 MED ORDER — ONDANSETRON HCL 4 MG PO TABS: 4.0000 mg | ORAL_TABLET | Freq: Four times a day (QID) | ORAL | Status: DC | PRN

## 2018-03-17 MED ORDER — ENALAPRILAT 1.25 MG/ML IV SOLN
0.6250 mg | Freq: Four times a day (QID) | INTRAVENOUS | Status: DC | PRN
  Filled 2018-03-17: qty 1

## 2018-03-17 MED ORDER — POLYETHYLENE GLYCOL 3350 17 GM/SCOOP PO POWD: 1.0000 | Freq: Once | ORAL | Status: DC

## 2018-03-17 MED ORDER — GUAIFENESIN-DM 100-10 MG/5ML PO SYRP: 10.0000 mL | ORAL_SOLUTION | ORAL | Status: DC | PRN

## 2018-03-17 SURGICAL SUPPLY — 113 items
APPLIER CLIP 5 13 M/L LIGAMAX5 (MISCELLANEOUS)
APPLIER CLIP ROT 10 11.4 M/L (STAPLE)
APR CLP MED LRG 11.4X10 (STAPLE)
APR CLP MED LRG 5 ANG JAW (MISCELLANEOUS)
BLADE EXTENDED COATED 6.5IN (ELECTRODE) ×3 IMPLANT
CANNULA REDUC XI 12-8 STAPL (CANNULA) ×1
CANNULA REDUC XI 12-8MM STAPL (CANNULA) ×1
CANNULA REDUCER 12-8 DVNC XI (CANNULA) ×1 IMPLANT
CELLS DAT CNTRL 66122 CELL SVR (MISCELLANEOUS) IMPLANT
CHLORAPREP W/TINT 26ML (MISCELLANEOUS) ×3 IMPLANT
CLIP APPLIE 5 13 M/L LIGAMAX5 (MISCELLANEOUS) IMPLANT
CLIP APPLIE ROT 10 11.4 M/L (STAPLE) IMPLANT
CLIP VESOLOCK LG 6/CT PURPLE (CLIP) IMPLANT
CLIP VESOLOCK MED LG 6/CT (CLIP) IMPLANT
COVER SURGICAL LIGHT HANDLE (MISCELLANEOUS) ×5 IMPLANT
COVER TIP SHEARS 8 DVNC (MISCELLANEOUS) ×1 IMPLANT
COVER TIP SHEARS 8MM DA VINCI (MISCELLANEOUS) ×2
DECANTER SPIKE VIAL GLASS SM (MISCELLANEOUS) ×3 IMPLANT
DEVICE TROCAR PUNCTURE CLOSURE (ENDOMECHANICALS) IMPLANT
DRAIN CHANNEL 19F RND (DRAIN) ×1 IMPLANT
DRAPE ARM DVNC X/XI (DISPOSABLE) ×3 IMPLANT
DRAPE COLUMN DVNC XI (DISPOSABLE) ×1 IMPLANT
DRAPE DA VINCI XI ARM (DISPOSABLE) ×6
DRAPE DA VINCI XI COLUMN (DISPOSABLE) ×2
DRAPE SURG IRRIG POUCH 19X23 (DRAPES) ×3 IMPLANT
DRSG OPSITE POSTOP 4X10 (GAUZE/BANDAGES/DRESSINGS) IMPLANT
DRSG OPSITE POSTOP 4X6 (GAUZE/BANDAGES/DRESSINGS) ×4 IMPLANT
DRSG OPSITE POSTOP 4X8 (GAUZE/BANDAGES/DRESSINGS) IMPLANT
DRSG TEGADERM 2-3/8X2-3/4 SM (GAUZE/BANDAGES/DRESSINGS) ×11 IMPLANT
DRSG TEGADERM 4X4.75 (GAUZE/BANDAGES/DRESSINGS) ×1 IMPLANT
ELECT PENCIL ROCKER SW 15FT (MISCELLANEOUS) ×3 IMPLANT
ELECT REM PT RETURN 15FT ADLT (MISCELLANEOUS) ×3 IMPLANT
ENDOLOOP SUT PDS II  0 18 (SUTURE)
ENDOLOOP SUT PDS II 0 18 (SUTURE) IMPLANT
EVACUATOR SILICONE 100CC (DRAIN) ×3 IMPLANT
GAUZE SPONGE 2X2 8PLY STRL LF (GAUZE/BANDAGES/DRESSINGS) ×1 IMPLANT
GAUZE SPONGE 4X4 12PLY STRL (GAUZE/BANDAGES/DRESSINGS) IMPLANT
GLOVE BIOGEL PI IND STRL 6.5 (GLOVE) IMPLANT
GLOVE BIOGEL PI IND STRL 7.0 (GLOVE) IMPLANT
GLOVE BIOGEL PI INDICATOR 6.5 (GLOVE) ×4
GLOVE BIOGEL PI INDICATOR 7.0 (GLOVE) ×4
GLOVE ECLIPSE 8.0 STRL XLNG CF (GLOVE) ×11 IMPLANT
GLOVE INDICATOR 8.0 STRL GRN (GLOVE) ×11 IMPLANT
GOWN STRL REUS W/ TWL LRG LVL3 (GOWN DISPOSABLE) IMPLANT
GOWN STRL REUS W/TWL LRG LVL3 (GOWN DISPOSABLE) ×12
GOWN STRL REUS W/TWL XL LVL3 (GOWN DISPOSABLE) ×15 IMPLANT
GRASPER ENDOPATH ANVIL 10MM (MISCELLANEOUS) IMPLANT
GRASPER SUT TROCAR 14GX15 (MISCELLANEOUS) ×1 IMPLANT
HOLDER FOLEY CATH W/STRAP (MISCELLANEOUS) ×3 IMPLANT
IRRIG SUCT STRYKERFLOW 2 WTIP (MISCELLANEOUS)
IRRIGATION SUCT STRKRFLW 2 WTP (MISCELLANEOUS) IMPLANT
IRRIGATOR SUCT 8 DISP DVNC XI (IRRIGATION / IRRIGATOR) IMPLANT
IRRIGATOR SUCTION 8MM XI DISP (IRRIGATION / IRRIGATOR)
KIT PROCEDURE DA VINCI SI (MISCELLANEOUS) ×2
KIT PROCEDURE DVNC SI (MISCELLANEOUS) ×1 IMPLANT
LUBRICANT JELLY K Y 4OZ (MISCELLANEOUS) ×1 IMPLANT
NDL INSUFFLATION 14GA 120MM (NEEDLE) ×1 IMPLANT
NEEDLE INSUFFLATION 14GA 120MM (NEEDLE) ×3 IMPLANT
PACK CARDIOVASCULAR III (CUSTOM PROCEDURE TRAY) ×3 IMPLANT
PACK COLON (CUSTOM PROCEDURE TRAY) ×3 IMPLANT
PAD POSITIONING PINK XL (MISCELLANEOUS) ×3 IMPLANT
PORT LAP GEL ALEXIS MED 5-9CM (MISCELLANEOUS) ×3 IMPLANT
RELOAD STAPLE 45 BLU REG DVNC (STAPLE) IMPLANT
RELOAD STAPLE 45 GRN THCK DVNC (STAPLE) IMPLANT
RETRACTOR WND ALEXIS 18 MED (MISCELLANEOUS) IMPLANT
RTRCTR WOUND ALEXIS 18CM MED (MISCELLANEOUS)
SCISSORS LAP 5X35 DISP (ENDOMECHANICALS) ×3 IMPLANT
SEAL CANN UNIV 5-8 DVNC XI (MISCELLANEOUS) ×3 IMPLANT
SEAL XI 5MM-8MM UNIVERSAL (MISCELLANEOUS) ×6
SEALER VESSEL DA VINCI XI (MISCELLANEOUS) ×2
SEALER VESSEL EXT DVNC XI (MISCELLANEOUS) ×1 IMPLANT
SLEEVE ADV FIXATION 5X100MM (TROCAR) ×3 IMPLANT
SOLUTION ELECTROLUBE (MISCELLANEOUS) ×3 IMPLANT
SPONGE GAUZE 2X2 STER 10/PKG (GAUZE/BANDAGES/DRESSINGS) ×2
SPONGE LAP 18X18 5 PK (GAUZE/BANDAGES/DRESSINGS) ×2 IMPLANT
STAPLER 45 BLU RELOAD XI (STAPLE) ×5 IMPLANT
STAPLER 45 BLUE RELOAD XI (STAPLE) ×10
STAPLER 45 GREEN RELOAD XI (STAPLE)
STAPLER 45 GRN RELOAD XI (STAPLE) IMPLANT
STAPLER CANNULA SEAL DVNC XI (STAPLE) ×1 IMPLANT
STAPLER CANNULA SEAL XI (STAPLE) ×2
STAPLER SHEATH (SHEATH) ×4
STAPLER SHEATH ENDOWRIST DVNC (SHEATH) ×1 IMPLANT
SUT MNCRL AB 4-0 PS2 18 (SUTURE) ×3 IMPLANT
SUT PDS AB 1 CT1 27 (SUTURE) ×2 IMPLANT
SUT PDS AB 1 CTX 36 (SUTURE) IMPLANT
SUT PDS AB 1 TP1 96 (SUTURE) IMPLANT
SUT PDS AB 2-0 CT2 27 (SUTURE) IMPLANT
SUT PROLENE 0 CT 2 (SUTURE) ×3 IMPLANT
SUT PROLENE 2 0 KS (SUTURE) IMPLANT
SUT PROLENE 2 0 SH DA (SUTURE) IMPLANT
SUT SILK 2 0 (SUTURE) ×3
SUT SILK 2 0 SH CR/8 (SUTURE) ×3 IMPLANT
SUT SILK 2-0 18XBRD TIE 12 (SUTURE) ×1 IMPLANT
SUT SILK 3 0 (SUTURE) ×3
SUT SILK 3 0 SH CR/8 (SUTURE) ×3 IMPLANT
SUT SILK 3-0 18XBRD TIE 12 (SUTURE) ×1 IMPLANT
SUT V-LOC BARB 180 2/0GR6 GS22 (SUTURE) ×6
SUT VIC AB 3-0 SH 18 (SUTURE) ×3 IMPLANT
SUT VIC AB 3-0 SH 27 (SUTURE) ×3
SUT VIC AB 3-0 SH 27XBRD (SUTURE) ×1 IMPLANT
SUT VICRYL 0 UR6 27IN ABS (SUTURE) ×3 IMPLANT
SUTURE V-LC BRB 180 2/0GR6GS22 (SUTURE) IMPLANT
SYR 10ML LL (SYRINGE) ×3 IMPLANT
SYS LAPSCP GELPORT 120MM (MISCELLANEOUS)
SYSTEM LAPSCP GELPORT 120MM (MISCELLANEOUS) IMPLANT
TAPE UMBILICAL COTTON 1/8X30 (MISCELLANEOUS) ×3 IMPLANT
TOWEL OR NON WOVEN STRL DISP B (DISPOSABLE) ×3 IMPLANT
TRAY FOLEY W/BAG SLVR 14FR (SET/KITS/TRAYS/PACK) ×2 IMPLANT
TROCAR ADV FIXATION 5X100MM (TROCAR) ×3 IMPLANT
TUBING CONNECTING 10 (TUBING) ×4 IMPLANT
TUBING CONNECTING 10' (TUBING) ×2
TUBING INSUFFLATION 10FT LAP (TUBING) ×3 IMPLANT

## 2018-03-17 NOTE — Interval H&P Note (Signed)
History and Physical Interval Note:  03/17/2018 1:07 PM  Tamara Gregory  has presented today for surgery, with the diagnosis of cecal cancer  The various methods of treatment have been discussed with the patient and family. After consideration of risks, benefits and other options for treatment, the patient has consented to  Procedure(s): XI [place] (Right) as a surgical intervention .  The patient's history has been reviewed, patient examined, no change in status, stable for surgery.  I have re-reviewed the the patient's records, history, medications, and allergies.  I have re-examined the patient.  I again discussed intraoperative plans and goals of post-operative recovery.  The patient agrees to proceed.  Tamara Gregory  10/04/1925 [id number]  Patient Care Team: Binnie Rail, MD as PCP - General (Internal Medicine) Nahser, Wonda Cheng, MD as PCP - Cardiology (Cardiology) Michael Boston, MD as Consulting Physician (General Surgery) Milus Banister, MD as Attending Physician (Gastroenterology) Nahser, Wonda Cheng, MD as Consulting Physician (Cardiology) Alda Berthold, DO as Consulting Physician (Neurology)  Patient Active Problem List   Diagnosis Date Noted  . 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 Diarrhea 12/23/2017  . Large hiatal hernia 04/01/2017  . Sweating profusely 03/19/2017  . Hyperglycemia 03/19/2017  . Squamous cell skin cancer 11/05/2016  . Nosebleed 10/19/2016  . Osteopenia 08/28/2016  . Essential hypertension, benign 08/21/2016  . Long term current use of systemic steroids 08/21/2016  . GERD (gastroesophageal reflux disease) 05/06/2016  . Mixed incontinence urge and stress 05/06/2016  . Low hemoglobin 09/22/2014  . Insomnia 07/26/2014  . Malaise and fatigue 10/04/2013  . PAC (premature atrial contraction) 10/04/2013  . Depression (emotion) 02/15/2013  . Spinal stenosis, lumbar region, with neurogenic claudication  02/04/2013  . Myasthenia gravis ([place]) 03/17/2012  . Pure hypercholesterolemia 09/05/2011  . Benign hypertensive heart disease without heart failure 09/05/2011  . Osteoarthritis 09/05/2011    Past Medical History:  Diagnosis Date  . Anemia   . Anxiety   . Arthritis   . Cancer (HCC)    hx of skin cancer , colon   . Cataract    removed both eyes   . Chronic back pain    okay with sitting, increased back pain with activity   . Depression   . GERD (gastroesophageal reflux disease)   . Glaucoma   . Headache(784.0)    hx of  . History of kidney stones   . History of migraines   . Hyperlipidemia   . Hypertension   . Macular degeneration   . Myasthenia gravis ([place])   . Occasional tremors   . Pneumonia   . Spinal stenosis   . Thoracic aneurysm    4.4cm; see CT done 01/28/12 and 01/22/13 in [facility].   [name] Uses hearing aid   . Vitamin D deficiency     Past Surgical History:  Procedure Laterality Date  . ABDOMINAL HYSTERECTOMY    . CHOLECYSTECTOMY    . COLON SURGERY     Right colectomy dr. Jesslyn Viglione 03-17-18  . LUMBAR LAMINECTOMY/DECOMPRESSION MICRODISCECTOMY N/A 02/04/2013   Procedure: CENTRAL DECOMPRESSION/LUMBAR LAMINECTOMY L3-L4 AND L4-L5 2 LEVELS;  Surgeon: Tobi Bastos, MD;  Location: [facility];  Service: Orthopedics;  Laterality: N/A;  . SPINAL CORD STIMULATOR IMPLANT     in right hip but not currently using because it did not help  . TONSILLECTOMY    . UPPER GASTROINTESTINAL ENDOSCOPY    . US ECHOCARDIOGRAPHY  05/18/2007   EF  55-60%    Social History   Socioeconomic History  . Marital status: Married    Spouse name: Not on file  . Number of children: Not on file  . Years of education: Not on file  . Highest education level: Not on file  Occupational History  . Not on file  Social Needs  . Financial resource strain: Not on file  . Food insecurity:    Worry: Not on file    Inability: Not on file  . Transportation needs:    Medical: Not on file    Non-medical: Not on  file  Tobacco Use  . Smoking status: Never Smoker  . Smokeless tobacco: Never Used  Substance and Sexual Activity  . Alcohol use: No  . Drug use: No  . Sexual activity: Not Currently  Lifestyle  . Physical activity:    Days per week: Not on file    Minutes per session: Not on file  . Stress: Not on file  Relationships  . Social connections:    Talks on phone: Not on file    Gets together: Not on file    Attends religious service: Not on file    Active member of club or organization: Not on file    Attends meetings of clubs or organizations: Not on file    Relationship status: Not on file  . Intimate partner violence:    Fear of current or ex partner: Not on file    Emotionally abused: Not on file    Physically abused: Not on file    Forced sexual activity: Not on file  Other Topics Concern  . Not on file  Social History Narrative   Lives with husband in a one story home.  Has 2 sons.  Retired.  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  Education: college.  [employer].    Family History  Problem Relation Age of Onset  . Heart attack Father   . Diabetes Father   . Cancer Sister   . Breast cancer Sister   . Cancer Brother   . Lung cancer Brother   . Cancer Sister   . Liver cancer Sister   . Diabetes Sister   . Colon polyps Neg Hx   . Colon cancer Neg Hx   . Esophageal cancer Neg Hx   . Rectal cancer Neg Hx   . Stomach cancer Neg Hx   . Adrenal disorder Neg Hx     Facility-Administered Medications Prior to Admission  Medication Dose Route Frequency Provider Last Rate Last Dose  . 0.9 %  sodium chloride infusion  500 mL Intravenous Once Milus Banister, MD       Medications Prior to Admission  Medication Sig Dispense Refill Last Dose  . acetaminophen (TYLENOL) 500 MG tablet Take 1,000 mg by mouth daily as needed for moderate pain or headache.   03/16/2018 at 1800  . aspirin EC 81 MG tablet Take 40.5 mg by mouth daily.    03/15/2018  . Calcium  Carbonate (CALCIUM 500 PO) Take 500 mg by mouth daily.    03/15/2018  . Cholecalciferol (VITAMIN D) 2000 units tablet Take 2,000 Units by mouth daily.   03/15/2018  . fluticasone (FLONASE) 50 MCG/ACT nasal spray Place 1 spray into both nostrils at bedtime.   03/15/2018  . Glucosamine-Chondroit-Vit C-Mn (GLUCOSAMINE 1500 COMPLEX PO) Take 1 tablet by mouth daily.    03/15/2018  . latanoprost (XALATAN) 0.005 % ophthalmic solution Place 1 drop  into the left eye at bedtime.  1 03/16/2018 at Unknown time  . losartan-hydrochlorothiazide (HYZAAR) 50-12.5 MG tablet Take 1 tablet by mouth daily. 90 tablet 3 03/15/2018  . Multiple Vitamins-Minerals (PRESERVISION AREDS) CAPS Take 1 capsule by mouth 2 (two) times daily.    03/15/2018  . Omega-3 Fatty Acids (FISH OIL) 435 MG CAPS Take 435 mg by mouth daily.   03/15/2018  . omeprazole (PRILOSEC) 40 MG capsule Take 40 mg by mouth daily.    03/17/2018 at Unknown time  . predniSONE (DELTASONE) 5 MG tablet Take 2 tablets (10 mg total) by mouth daily with breakfast. 180 tablet 3 03/15/2018  . pyridostigmine (MESTINON) 60 MG tablet Take half tablet 9am and 2pm 30 tablet 3 03/17/2018 at Unknown time  . rosuvastatin (CRESTOR) 5 MG tablet TAKE 1 TABLET EVERY DAY (Patient taking differently: TAKE 1 TABLET EVERY DAY IN THE EVENING) 90 tablet 1 03/15/2018  . sertraline (ZOLOFT) 50 MG tablet Take 1 tablet (50 mg total) by mouth at bedtime. 30 tablet 3 03/15/2018  . traZODone (DESYREL) 50 MG tablet TAKE 1/2 TO 1 TABLET (25-50 MG TOTAL) BY MOUTH AT BEDTIME AS NEEDED FOR SLEEP. (Patient taking differently: Take 50 mg by mouth at bedtime) 90 tablet 1 03/15/2018  . ferrous sulfate 325 (65 FE) MG tablet Take 325 mg by mouth daily with breakfast.   03/13/2018    Current Facility-Administered Medications  Medication Dose Route Frequency Provider Last Rate Last Dose  . bisacodyl (DULCOLAX) EC tablet 20 mg  20 mg Oral Once Michael Boston, MD      . bupivacaine liposome (EXPAREL) 1.3 % injection 266  mg  20 mL Infiltration On Call to OR Polly Cobia, RPH      . cefoTEtan (CEFOTAN) 2 g in sodium chloride 0.9 % 100 mL IVPB  2 g Intravenous On Call to [place], [place], [place]      . clindamycin (CLEOCIN) 900 mg, gentamicin (GARAMYCIN) 240 mg in sodium chloride 0.9 % 1,000 mL for intraperitoneal lavage   Intraperitoneal To OR Michael Boston, MD      . lactated ringers infusion   Intravenous Continuous Albertha Ghee, MD 50 mL/hr at 03/17/18 1141       No Known Allergies  BP (!) 155/90   Pulse 81   Temp 98.8 F (37.1 C) (Oral)   Resp 16   Ht 5\' 2"  (1.575 m)   Wt 140 lb (63.5 kg)   SpO2 94%   BMI 25.61 kg/m   Labs: No results found for this or any previous visit (from the past 48 hour(s)).  Imaging / Studies: Ct Chest W Contrast  Result Date: 03/06/2018 CLINICAL DATA:  Newly diagnosed cecal mass. Chronic diarrhea with iron deficiency anemia. EXAM: CT CHEST, ABDOMEN, AND PELVIS WITH CONTRAST TECHNIQUE: Multidetector CT imaging of the chest, abdomen and pelvis was performed following the standard protocol during bolus administration of intravenous contrast. CONTRAST:  161mL ISOVUE-300 IOPAMIDOL (ISOVUE-300) INJECTION 61% COMPARISON:  Multiple exams, including CT scans from 01/22/2013 FINDINGS: CT CHEST FINDINGS Cardiovascular: Aortic valve calcification with aneurysmal dilatation of the ascending thoracic aorta and aortic arch. Proximal ascending thoracic aortic diameter 4.3 cm. Mid ascending thoracic aortic diameter 4.4 cm. Proximal arch diameter 3.9 cm. Mid arch 3.8 cm with significant mural thrombus. Posterior arch diameter 3.8 cm. Mid descending thoracic aortic diameter 2.7 cm. Diameter at the hiatus 2.5 cm. Notable atherosclerotic calcification of the thoracic aorta. Some mild calcification of the mitral valve. There is also some  likely chronic mural thrombus in the lower thoracic aorta near the hiatus. No findings of dissection or acute aortic abnormality. Mild cardiomegaly is present.  Trace pericardial fluid with mild calcification along the right anterior pericardium on image 42/2. Mediastinum/Nodes: Moderate-sized type 3 hiatal hernia. Lungs/Pleura: Biapical pleuroparenchymal scarring there is linear scarring in the anterior inferior portion of the right upper lobe. 0.4 by 0.3 cm left lower lobe pulmonary nodule, image 86/3, no appreciable change from 2014, considered benign. Mild lingular scarring or subsegmental atelectasis. Musculoskeletal: 4-5 mm of degenerative anterolisthesis at C7-T1, along with lesser degrees of degenerative anterolisthesis at T3-4 and T4-5 as well as T10-11. Dorsal column stimulator extends from the T7-8 level down to the T11-12 level. Thoracic spondylosis and multilevel degenerative disc disease. CT ABDOMEN PELVIS FINDINGS Hepatobiliary: Indistinct accentuated enhancement anteriorly in segment II of the liver measuring 1.1 by 0.9 cm on image 48/2. Looking back at the prior CT chest from 01/22/2013, there is a suggestion of a similar 1.1 by 0.8 cm focus of enhancement in this region, raising the possibility of transient hepatic attenuation difference or a small underlying hemangioma. The presence of this lesion back in 2014 argues against this being a metastatic lesion. Prior cholecystectomy noted. The liver appears otherwise unremarkable. Pancreas: Unremarkable Spleen: Unremarkable Adrenals/Urinary Tract: Unremarkable Stomach/Bowel: Large cecal mass proximally 4.7 by 4.0 by 5.8 cm, most of which is intraluminal and along the cecal wall, with several small pericecal lymph nodes measuring up to about 6 mm in short axis on image 88/2. The mass is closely associated with the right ovary and an underlying cystic lesion of the right ovary, with the cystic lesion measuring 4.9 by 4.0 by 4.5 cm, an without intact fat planes between the cecum/cecal mass and the ovary. Vascular/Lymphatic: Aortoiliac atherosclerotic vascular disease. Small caliber celiac trunk. No overt  pathologically enlarged adenopathy. Reproductive: As noted above, there is a large cyst of the right ovary and the ovary appears potentially adherent to the cecum. Within the left ovary there is a 1.6 by 1.0 cm dense calcification larger than in 2005 but which was previously present in 2005, hence likely benign. Uterus absent. Other: Scattered sigmoid colon diverticula. Musculoskeletal: Notable left eccentric disc protrusion at the L1-2 level extending into the left neural foramen. Levoconvex lumbar scoliosis. Mild right foraminal impingement at L3-4 and L4-5 due to spurring; left foraminal impingement at L4-5 is potentially severe and due to spurring and disc protrusion. Degenerative hip arthropathy, right greater than left. IMPRESSION: 1. Large cecal mass. The cecum is immediately adjacent to the right ovary and may be adherent to the ovary. There is also a notable right ovarian cyst measuring 4.9 cm in long axis. 2. Several small pericecal lymph nodes are clustered and probably reflect early malignant spread. 3. Small focus of enhancement in segment II of the liver, appears to of been present since at least 2014 hence unlikely to be malignant. This is more likely to be a small hemangioma. Thoracic aortic aneurysmal dilatation in the ascending aorta and arch, up to 4.4 cm in diameter, with extensive atherosclerosis and some mural thrombus. Recommend annual imaging followup by CTA or MRA. This recommendation follows 2010 ACCF/AHA/AATS/ACR/ASA/SCA/SCAI/SIR/STS/SVM Guidelines for the Diagnosis and Management of Patients with Thoracic Aortic Disease. Circulation. 2010; 121: Y865-H846. 4. Other imaging findings of potential clinical significance: Aortic Atherosclerosis (ICD10-I70.0). Mild cardiomegaly. Trace pericardial fluid. Moderate-sized type 3 hiatal hernia. Mild bilateral scarring or atelectasis in the lungs. Multilevel spondylosis and degenerative disc disease in the spine with resulting  impingement at L3-4 and  L4-5. Dorsal column stimulator in place spanning from T8 through T11. Right greater than left degenerative hip arthropathy. Electronically Signed   By: Van Clines M.D.   On: 03/06/2018 14:13   Ct Abdomen Pelvis W Contrast  Result Date: 03/06/2018 CLINICAL DATA:  Newly diagnosed cecal mass. Chronic diarrhea with iron deficiency anemia. EXAM: CT CHEST, ABDOMEN, AND PELVIS WITH CONTRAST TECHNIQUE: Multidetector CT imaging of the chest, abdomen and pelvis was performed following the standard protocol during bolus administration of intravenous contrast. CONTRAST:  181mL ISOVUE-300 IOPAMIDOL (ISOVUE-300) INJECTION 61% COMPARISON:  Multiple exams, including CT scans from 01/22/2013 FINDINGS: CT CHEST FINDINGS Cardiovascular: Aortic valve calcification with aneurysmal dilatation of the ascending thoracic aorta and aortic arch. Proximal ascending thoracic aortic diameter 4.3 cm. Mid ascending thoracic aortic diameter 4.4 cm. Proximal arch diameter 3.9 cm. Mid arch 3.8 cm with significant mural thrombus. Posterior arch diameter 3.8 cm. Mid descending thoracic aortic diameter 2.7 cm. Diameter at the hiatus 2.5 cm. Notable atherosclerotic calcification of the thoracic aorta. Some mild calcification of the mitral valve. There is also some likely chronic mural thrombus in the lower thoracic aorta near the hiatus. No findings of dissection or acute aortic abnormality. Mild cardiomegaly is present. Trace pericardial fluid with mild calcification along the right anterior pericardium on image 42/2. Mediastinum/Nodes: Moderate-sized type 3 hiatal hernia. Lungs/Pleura: Biapical pleuroparenchymal scarring there is linear scarring in the anterior inferior portion of the right upper lobe. 0.4 by 0.3 cm left lower lobe pulmonary nodule, image 86/3, no appreciable change from 2014, considered benign. Mild lingular scarring or subsegmental atelectasis. Musculoskeletal: 4-5 mm of degenerative anterolisthesis at C7-T1, along with  lesser degrees of degenerative anterolisthesis at T3-4 and T4-5 as well as T10-11. Dorsal column stimulator extends from the T7-8 level down to the T11-12 level. Thoracic spondylosis and multilevel degenerative disc disease. CT ABDOMEN PELVIS FINDINGS Hepatobiliary: Indistinct accentuated enhancement anteriorly in segment II of the liver measuring 1.1 by 0.9 cm on image 48/2. Looking back at the prior CT chest from 01/22/2013, there is a suggestion of a similar 1.1 by 0.8 cm focus of enhancement in this region, raising the possibility of transient hepatic attenuation difference or a small underlying hemangioma. The presence of this lesion back in 2014 argues against this being a metastatic lesion. Prior cholecystectomy noted. The liver appears otherwise unremarkable. Pancreas: Unremarkable Spleen: Unremarkable Adrenals/Urinary Tract: Unremarkable Stomach/Bowel: Large cecal mass proximally 4.7 by 4.0 by 5.8 cm, most of which is intraluminal and along the cecal wall, with several small pericecal lymph nodes measuring up to about 6 mm in short axis on image 88/2. The mass is closely associated with the right ovary and an underlying cystic lesion of the right ovary, with the cystic lesion measuring 4.9 by 4.0 by 4.5 cm, an without intact fat planes between the cecum/cecal mass and the ovary. Vascular/Lymphatic: Aortoiliac atherosclerotic vascular disease. Small caliber celiac trunk. No overt pathologically enlarged adenopathy. Reproductive: As noted above, there is a large cyst of the right ovary and the ovary appears potentially adherent to the cecum. Within the left ovary there is a 1.6 by 1.0 cm dense calcification larger than in 2005 but which was previously present in 2005, hence likely benign. Uterus absent. Other: Scattered sigmoid colon diverticula. Musculoskeletal: Notable left eccentric disc protrusion at the L1-2 level extending into the left neural foramen. Levoconvex lumbar scoliosis. Mild right foraminal  impingement at L3-4 and L4-5 due to spurring; left foraminal impingement at L4-5 is potentially  severe and due to spurring and disc protrusion. Degenerative hip arthropathy, right greater than left. IMPRESSION: 1. Large cecal mass. The cecum is immediately adjacent to the right ovary and may be adherent to the ovary. There is also a notable right ovarian cyst measuring 4.9 cm in long axis. 2. Several small pericecal lymph nodes are clustered and probably reflect early malignant spread. 3. Small focus of enhancement in segment II of the liver, appears to of been present since at least 2014 hence unlikely to be malignant. This is more likely to be a small hemangioma. Thoracic aortic aneurysmal dilatation in the ascending aorta and arch, up to 4.4 cm in diameter, with extensive atherosclerosis and some mural thrombus. Recommend annual imaging followup by CTA or MRA. This recommendation follows 2010 ACCF/AHA/AATS/ACR/ASA/SCA/SCAI/SIR/STS/SVM Guidelines for the Diagnosis and Management of Patients with Thoracic Aortic Disease. Circulation. 2010; 121: T654-Y503. 4. Other imaging findings of potential clinical significance: Aortic Atherosclerosis (ICD10-I70.0). Mild cardiomegaly. Trace pericardial fluid. Moderate-sized type 3 hiatal hernia. Mild bilateral scarring or atelectasis in the lungs. Multilevel spondylosis and degenerative disc disease in the spine with resulting impingement at L3-4 and L4-5. Dorsal column stimulator in place spanning from T8 through T11. Right greater than left degenerative hip arthropathy. Electronically Signed   By: Van Clines M.D.   On: 03/06/2018 14:13     .Adin Hector, M.D., F.A.C.S. Gastrointestinal and Minimally Invasive Surgery [facility] [address], [place], [place] [address] [phone] Main / Paging  03/17/2018 1:08 PM      I have reviewed the patient's chart and labs.  Questions were answered to the patient's  satisfaction.     Adin Hector

## 2018-03-17 NOTE — Transfer of Care (Signed)
Immediate Anesthesia Transfer of Care Note  Patient: Tamara Gregory  Procedure(s) Performed: ROBOTIC RIGHT COLECTOMY, RIGHT RSO (Right Abdomen)  Patient Location: PACU  Anesthesia Type:General  Level of Consciousness: sedated, patient cooperative and responds to stimulation  Airway & Oxygen Therapy: Patient Spontanous Breathing and Patient connected to face mask oxygen  Post-op Assessment: Report given to RN and Post -op Vital signs reviewed and stable  Post vital signs: Reviewed and stable  Last Vitals:  Vitals Value Taken Time  BP 226/113 03/17/2018  4:51 PM  Temp    Pulse 87 03/17/2018  4:53 PM  Resp 17 03/17/2018  4:53 PM  SpO2 98 % 03/17/2018  4:53 PM  Vitals shown include unvalidated device data.  Last Pain:  Vitals:   03/17/18 1128  TempSrc:   PainSc: 0-No pain         Complications: No apparent anesthesia complications

## 2018-03-17 NOTE — Anesthesia Preprocedure Evaluation (Signed)
Anesthesia Evaluation  Patient identified by MRN, date of birth, ID band Patient awake    Reviewed: Allergy & Precautions, H&P , NPO status , Patient's Chart, lab work & pertinent test results  Airway Mallampati: II   Neck ROM: full    Dental   Pulmonary    breath sounds clear to auscultation       Cardiovascular hypertension, + Peripheral Vascular Disease   Rhythm:regular Rate:Normal     Neuro/Psych  Headaches, PSYCHIATRIC DISORDERS Anxiety Depression Myasthenia gravis  Neuromuscular disease    GI/Hepatic hiatal hernia, GERD  ,Cecal CA   Endo/Other    Renal/GU      Musculoskeletal  (+) Arthritis ,   Abdominal   Peds  Hematology  (+) anemia ,   Anesthesia Other Findings   Reproductive/Obstetrics                             Anesthesia Physical Anesthesia Plan  ASA: III  Anesthesia Plan: General   Post-op Pain Management:    Induction: Intravenous  PONV Risk Score and Plan: 3 and Ondansetron, Dexamethasone and Treatment may vary due to age or medical condition  Airway Management Planned: Oral ETT  Additional Equipment:   Intra-op Plan:   Post-operative Plan: Extubation in OR  Informed Consent: I have reviewed the patients History and Physical, chart, labs and discussed the procedure including the risks, benefits and alternatives for the proposed anesthesia with the patient or authorized representative who has indicated his/her understanding and acceptance.     Plan Discussed with: CRNA, Anesthesiologist and Surgeon  Anesthesia Plan Comments:         Anesthesia Quick Evaluation

## 2018-03-17 NOTE — Anesthesia Procedure Notes (Signed)
Procedure Name: Intubation °Performed by: Takeru Bose J, CRNA °Pre-anesthesia Checklist: Patient identified, Emergency Drugs available, Suction available, Patient being monitored and Timeout performed °Patient Re-evaluated:Patient Re-evaluated prior to induction °Oxygen Delivery Method: Circle system utilized °Preoxygenation: Pre-oxygenation with 100% oxygen °Induction Type: IV induction °Ventilation: Mask ventilation without difficulty °Laryngoscope Size: Mac and 3 °Grade View: Grade I °Tube type: Oral °Tube size: 7.0 mm °Number of attempts: 1 °Airway Equipment and Method: Stylet °Placement Confirmation: ETT inserted through vocal cords under direct vision,  positive ETCO2,  CO2 detector and breath sounds checked- equal and bilateral °Secured at: 21 cm °Tube secured with: Tape °Dental Injury: Teeth and Oropharynx as per pre-operative assessment  ° ° ° ° ° ° °

## 2018-03-17 NOTE — Op Note (Signed)
03/17/2018  4:39 PM  PATIENT:  Tamara Gregory  84 y.o. female  Patient Care Team: Binnie Rail, MD as PCP - General (Internal Medicine) Nahser, Wonda Cheng, MD as PCP - Cardiology (Cardiology) Michael Boston, MD as Consulting Physician (General Surgery) Milus Banister, MD as Attending Physician (Gastroenterology) Nahser, Wonda Cheng, MD as Consulting Physician (Cardiology) Alda Berthold, DO as Consulting Physician (Neurology)  PRE-OPERATIVE DIAGNOSIS:  Cecal cancer, right ovarian cyst  POST-OPERATIVE DIAGNOSIS:    Cecal cancer Right ovarian cyst Skin mass with h/o skin cancer  PROCEDURE:    ROBOTIC PROXIMAL "RIGHT" COLECTOMY RIGHT SALPINGO-OOPHERECTOMY EXCISION OF SKIN MASS  SURGEON:  Adin Hector, MD, FACS, MASCRS  ASSISTANT: Ralene Ok, MD, FACS  ANESTHESIA:   local and general  EBL:  Total I/O In: 2000 [I.V.:2000] Out: 250 [Urine:200; Blood:50]  Delay start of Pharmacological VTE agent (>24hrs) due to surgical blood loss or risk of bleeding:  no  DRAINS: none   SPECIMEN:   DISPOSITION OF SPECIMEN:  PATHOLOGY  COUNTS:  YES  PLAN OF CARE: Admit to inpatient   PATIENT DISPOSITION:  PACU - hemodynamically stable.  INDICATION:    Patient with near obstructing cancer of cecum.  Also ovarian cyst adherent to the region as well I recommended segmental resection:  The anatomy & physiology of the digestive tract was discussed.  The pathophysiology was discussed.  Natural history risks without surgery was discussed.   I worked to give an overview of the disease and the frequent need to have multispecialty involvement.  I feel the risks of no intervention will lead to serious problems that outweigh the operative risks; therefore, I recommended a partial colectomy to remove the pathology.  Laparoscopic & open techniques were discussed.   Risks such as bleeding, infection, abscess, leak, reoperation, possible ostomy, hernia, heart attack, death, and other risks were  discussed.  I noted a good likelihood this will help address the problem.   Goals of post-operative recovery were discussed as well.  We will work to minimize complications.  An educational handout on the pathology was given as well.  Questions were answered.    The patient expresses understanding & wishes to proceed with surgery.  OR FINDINGS:   Patient had okay tumor of cecum and ascending colon.  Simple ovarian cystic mass somewhat adherent to it resected en bloc.  6 cm size.  It is an ileocolonic (isoperistaltic, side to side stapled) anastomosis that rests in the supraumbilical region  Flat skin mass 15x12x50mm, most likely seborrheic keratosis but a little atypical.  Excised with the Pfannenstiel incision  No obvious metastatic disease on visceral parietal peritoneum or liver.  DESCRIPTION:   Informed consent was confirmed.  The patient underwent general anaesthesia without difficulty.  The patient was positioned with arms tucked & secured appropriately.  VTE prevention in place.  The patient's abdomen was clipped, prepped, & draped in a sterile fashion.  Surgical timeout confirmed our plan.  The patient was positioned in reverse Trendelenburg.  Abdominal entry was gained using Varess technique in the left upper abdomen.  Entry was clean.  I induced carbon dioxide insufflation.  Camera inspection revealed no injury.  Extra ports were carefully placed under direct laparoscopic visualization.  I mobilized & reflected the greater omentum and small bowel in the upper abdomen.  The bulky tumor near tattooing in the cecum and ascending colon as expected.  I can see the a simple cystic mass on the right ovary.  Consistent with what  was noted on the CAT scan.  I was able to elevate the proximal colon to isolate the ileocolonic pedicle.  I scored the ileal mesentery just proximal to that.   I carried that further dissection in a medial to lateral fashion.  I was able to bluntly get into the  retro-mesenteric plane on the right side.  I freed the proximal right sided colonic mesentery off the retroperitoneum including the duodenal sweep, pancreatic head, & Gerota's fascia of the right kidney. I was able to get underneath the hepatic flexure.  I was able to get underneath the proximal and mid transverse colon.  I isolated the proximal ileocecal pedicle.  I skeletonized it & transected the vessels using the bipolar Enseal system.  I transected the right colon mesentery up the proximal transverse colon staying just lateral to a dominant right middle colic artery pedicle.  This was just medial to where the gallbladder used to be.  I then proceeded to free the greater omentum off the mid transverse colon towards the hepatic flexure.  I then mobilized the transverse colon and a superior to inferior fashion freeing adhesions to the liver where the gallbladder used to be.  Came around the hepatic flexure.  I then positioned the patient in Trendelenburg and re-docked to the robot.  I came around ovarian cystic mass with the base adherent to the cecum.  Did not feel like it could be removed separately and should be removed en bloc.  Skeletonized around it.  I elevated the distal ileum mesentery off the retroperitoneum.  Saw the right ureter in the retroperitoneal position and kept alone.  Would follow the ovarian pedicle and skeletonized that off the retroperitoneum.  Once we confirmed the ureters out a way I completed transection around the ovary and fallopian tube and transected the ovarian pedicle a few centimeters staying away from the ureter.  I left this to stay en bloc with the ileocecal region.  I mobilized the terminal ileum & proximal "right" colon in a lateral to medial fashion.  I mobilized the distal ileal mesentery off its retroperitoneal and pelvic attachments.  I mobilized the ascending colon off It is side wall attachments to the paracolic gutter and retroperitoneum.  This allowed me to mobilize  the hepatic flexure and get a complete mobilization of the proximal "right" colon to the mid-transverse colon..  I could isolate the pathology. When he went ahead and proceeded with transection.  I transected the distal ileal mesentery and then transected at the distal ileum with a robotic stapler.  I then transected transverse colon mesentery just proximal to a dominant middle colic arterial pedicle radially.  Transected at the proximal transverse colon with a robotic stapler.  90% across with one firing.  Had to do a second firing since the colon was somewhat enlarged.  I did a side-to-side stapled anastomosis of ileum to mid-transverse colon using a 33mm robotic stapler x 2 firings in an isoperistaltic fashion.  (Distal stump of ileum to mid transverse colon for the distal end of the anastomosis.  Proximal end of colon stump to more proximal ileum for the proximal end of the anastomosis).  I sewed the common staple channel wound with an absorbable suture ( 2-0 V-lock) in a running [place] fashion from each corner and meeting in the center.  I did meticulous inspection prove an airtight closure.  I protected the anastomosis line with an anterior omentopexy of greater omentum using V lock suture.  Gently brought the sutures the  omentum and stenotic them down for them to be covering and protecting without being restrictive.  We did reinspection of the abdomen.  Hemostasis was good.   Ureters, retroperitoneum, and bowel uninjured.  The anastomosis looked healthy.   We did a final irrigation of antibiotic solution (900 mg clindamycin/240 mg gentamicin in a liter of crystalloid) & held that while we placed the wound protector through the 52mm port site after it was enlarged in a Pfannenstiel fashion.  Specimen removed without incident.  I did remove a abnormal skin mass that was in line with the Pfannenstiel incision.  15 mm maximal diameter  We changed gloves & redraped the patient per colon SSI prevention  protocol.  We aspirated the antibiotic irrigation.  Hemostasis was good.  Sterile unused instruments were used from this point.  I closed the skin at the port sites using Monocryl stitch and sterile dressing.  I closed the extraction wound using a 0 Vicryl vertical peritoneal closure and a #1 PDS transverse anterior rectal fascial closure like a small Pfannenstiel closure. I closed the skin with some interrupted Monocryl stitches. I placed antibiotic-soaked wicks into the closure at the corners x2.  I placed sterile dressings.     Patient is being extubated go to recovery room. I discussed postop care with the patient in detail the office & in the holding area. Instructions are written.  I updated the patient's status to the family.  Recommendations were made.  Questions were answered.  The family expressed understanding & appreciation.  Adin Hector, M.D., F.A.C.S. Gastrointestinal and Minimally Invasive Surgery [facility] [address], [place], [place] [address] [phone] Main / Paging

## 2018-03-18 DIAGNOSIS — E876 Hypokalemia: Secondary | ICD-10-CM

## 2018-03-18 LAB — BASIC METABOLIC PANEL
ANION GAP: 11 (ref 5–15)
BUN: 8 mg/dL (ref 6–20)
CALCIUM: 8.5 mg/dL — AB (ref 8.9–10.3)
CO2: 24 mmol/L (ref 22–32)
Chloride: 106 mmol/L (ref 101–111)
Creatinine, Ser: 0.73 mg/dL (ref 0.44–1.00)
Glucose, Bld: 172 mg/dL — ABNORMAL HIGH (ref 65–99)
POTASSIUM: 3.4 mmol/L — AB (ref 3.5–5.1)
SODIUM: 141 mmol/L (ref 135–145)

## 2018-03-18 LAB — CBC
HEMATOCRIT: 26.6 % — AB (ref 36.0–46.0)
HEMOGLOBIN: 8.1 g/dL — AB (ref 12.0–15.0)
MCH: 27.8 pg (ref 26.0–34.0)
MCHC: 30.5 g/dL (ref 30.0–36.0)
MCV: 91.4 fL (ref 78.0–100.0)
Platelets: 315 10*3/uL (ref 150–400)
RBC: 2.91 MIL/uL — ABNORMAL LOW (ref 3.87–5.11)
RDW: 19.2 % — ABNORMAL HIGH (ref 11.5–15.5)
WBC: 12.8 10*3/uL — AB (ref 4.0–10.5)

## 2018-03-18 LAB — MAGNESIUM: Magnesium: 1.6 mg/dL — ABNORMAL LOW (ref 1.7–2.4)

## 2018-03-18 MED ORDER — HYDROCHLOROTHIAZIDE 12.5 MG PO CAPS
12.5000 mg | ORAL_CAPSULE | Freq: Every day | ORAL | Status: DC
  Administered 2018-03-18 – 2018-03-20 (×3): 12.5 mg via ORAL
  Filled 2018-03-18 (×3): qty 1

## 2018-03-18 MED ORDER — LOSARTAN POTASSIUM 50 MG PO TABS
100.0000 mg | ORAL_TABLET | Freq: Every day | ORAL | Status: DC
  Administered 2018-03-18 – 2018-03-20 (×3): 100 mg via ORAL
  Filled 2018-03-18 (×3): qty 2

## 2018-03-18 MED ORDER — POTASSIUM CHLORIDE CRYS ER 20 MEQ PO TBCR
40.0000 meq | EXTENDED_RELEASE_TABLET | Freq: Every day | ORAL | Status: AC
  Administered 2018-03-18 – 2018-03-20 (×3): 40 meq via ORAL
  Filled 2018-03-18 (×3): qty 2

## 2018-03-18 MED ORDER — LOSARTAN POTASSIUM-HCTZ 50-12.5 MG PO TABS: 1.0000 | ORAL_TABLET | Freq: Every day | ORAL | Status: DC

## 2018-03-18 MED ORDER — FERROUS SULFATE 325 (65 FE) MG PO TABS
325.0000 mg | ORAL_TABLET | Freq: Every day | ORAL | Status: DC
  Administered 2018-03-18 – 2018-03-20 (×3): 325 mg via ORAL
  Filled 2018-03-18 (×3): qty 1

## 2018-03-18 MED ORDER — ASPIRIN 81 MG PO CHEW
40.5000 mg | CHEWABLE_TABLET | Freq: Every day | ORAL | Status: DC
  Administered 2018-03-18 – 2018-03-20 (×3): 40.5 mg via ORAL
  Filled 2018-03-18 (×3): qty 1

## 2018-03-18 NOTE — Progress Notes (Addendum)
[facility]  [address] [place], [place] [address] Phone: [phone]  FAX: [phone]      Tamara Gregory [id number] 06/29/1925  CARE TEAM:  PCP: Binnie Rail, MD  Outpatient Care Team: Patient Care Team: Binnie Rail, MD as PCP - General (Internal Medicine) Nahser, Wonda Cheng, MD as PCP - Cardiology (Cardiology) Michael Boston, MD as Consulting Physician (General Surgery) Milus Banister, MD as Attending Physician (Gastroenterology) Nahser, Wonda Cheng, MD as Consulting Physician (Cardiology) Alda Berthold, DO as Consulting Physician (Neurology)  Inpatient Treatment Team: Treatment Team: Attending Provider: Michael Boston, MD; Technician: Etheleen Sia, NT; Registered Nurse: Mortimer Fries, RN   Problem List:   Principal Problem:   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 Active Problems:   Pure hypercholesterolemia   Benign hypertensive heart disease without heart failure   Osteoarthritis   Myasthenia gravis ([place])   Spinal stenosis, lumbar region, with neurogenic claudication   Depression (emotion)   Insomnia   GERD (gastroesophageal reflux disease)   Mixed incontinence urge and stress   Essential hypertension, benign   Long term current use of systemic steroids   Squamous cell skin cancer   Right ovarian cyst s/p RSO   Hypokalemia   1 Day Post-Op  03/17/2018  POST-OPERATIVE DIAGNOSIS:    Cecal cancer Right ovarian cyst Skin mass with h/o skin cancer  PROCEDURE:    ROBOTIC PROXIMAL "RIGHT" COLECTOMY RIGHT SALPINGO-OOPHERECTOMY EXCISION OF SKIN MASS  SURGEON:  Adin Hector, MD, FACS, MASCRS      Assessment  Recovering well so far  Plan:  -adv diet per ERAS protocol -stop & follow off IVF -f/u path -Myasthenia gravis - baseline steroid -bowel regimen -HTN - restart home meds & follow -hypoK - replace & check Mg -VTE prophylaxis- SCDs, etc -mobilize as  tolerated to help recovery  20 minutes spent in review, evaluation, examination, counseling, and coordination of care.  More than 50% of that time was spent in counseling.  Adin Hector, M.D., F.A.C.S. Gastrointestinal and Minimally Invasive Surgery [facility] [address] [place], [place] [address] [phone] Main / Paging   03/18/2018    Subjective: (Chief complaint)  Sore Tol liquids Husband & RN & CNA in room Loose BM this AM HAd N/V preop - not postop  Objective:  Vital signs:  Vitals:   03/17/18 2057 03/18/18 0212 03/18/18 0500 03/18/18 0559  BP: (!) 181/88 131/75  (!) 145/82  Pulse: 62 (!) 56  91  Resp: _0 Temp: 98.2 F (36.8 C) 97.6 F (36.4 C)  98.1 F (36.7 C)  TempSrc: Oral Oral  Oral  SpO2: 99% 94%  97%  Weight:   148 lb 5.9 oz (67.3 kg)   Height:           Intake/Output   Yesterday:  04/16 0701 - 04/17 0700 In: 5697 [P.O.:720; I.V.:2700; IV Piggyback:50] Out: 1800 [Urine:1750; Blood:50] This shift:  No intake/output data recorded.  Bowel function:  Flatus: YES  BM:  YES  Drain: (No drain)   Physical Exam:  General: Pt awake/alert/oriented x4 in no acute distress.  Smiling. Joking/teasing Eyes: PERRL, normal EOM.  Sclera clear.  No icterus Neuro: CN II-XII intact w/o focal sensory/motor deficits. Lymph: No head/neck/groin lymphadenopathy Psych:  No delerium/psychosis/paranoia HENT: Normocephalic, Mucus membranes moist.  No thrush Neck: Supple, No tracheal deviation Chest: No chest wall pain w good excursion CV:  Pulses intact.  Regular rhythm MS: Normal AROM mjr joints.  No obvious deformity  Abdomen: Soft.  Nondistended.  Mildly tender at incisions only.  No evidence of peritonitis.  No incarcerated hernias.  Ext:  No deformity.  No mjr edema.  No cyanosis Skin: No petechiae / purpura  Results:   Labs: Results for orders placed or performed during the hospital encounter of  03/17/18 (from the past 48 hour(s))  Glucose, capillary     Status: Abnormal   Collection Time: 03/17/18  7:55 PM  Result Value Ref Range   Glucose-Capillary 154 (H) 65 - 99 mg/dL  Basic metabolic panel     Status: Abnormal   Collection Time: 03/18/18  4:45 AM  Result Value Ref Range   Sodium 141 135 - 145 mmol/L   Potassium 3.4 (L) 3.5 - 5.1 mmol/L   Chloride 106 101 - 111 mmol/L   CO2 24 22 - 32 mmol/L   Glucose, Bld 172 (H) 65 - 99 mg/dL   BUN 8 6 - 20 mg/dL   Creatinine, Ser 0.73 0.44 - 1.00 mg/dL   Calcium 8.5 (L) 8.9 - 10.3 mg/dL   GFR calc non Af Amer >60 >60 mL/min   GFR calc Af Amer >60 >60 mL/min    Comment: (NOTE) The eGFR has been calculated using the CKD EPI equation. This calculation has not been validated in all clinical situations. eGFR's persistently <60 mL/min signify possible Chronic Kidney Disease.    Anion gap 11 5 - 15    Comment: Performed at [facility], [place] [address], [place], [place] [address]  CBC     Status: Abnormal   Collection Time: 03/18/18  4:45 AM  Result Value Ref Range   WBC 12.8 (H) 4.0 - 10.5 K/uL   RBC 2.91 (L) 3.87 - 5.11 MIL/uL   Hemoglobin 8.1 (L) 12.0 - 15.0 g/dL   HCT 26.6 (L) 36.0 - 46.0 %   MCV 91.4 78.0 - 100.0 fL   MCH 27.8 26.0 - 34.0 pg   MCHC 30.5 30.0 - 36.0 g/dL   RDW 19.2 (H) 11.5 - 15.5 %   Platelets 315 150 - 400 K/uL    Comment: Performed at [facility], [place] [address] [place], [place] [address]    Imaging / Studies: No results found.  Medications / Allergies: per chart  Antibiotics: Anti-infectives (From admission, onward)   Start     Dose/Rate Route Frequency Ordered Stop   03/17/18 2200  cefoTEtan (CEFOTAN) 2 g in sodium chloride 0.9 % 100 mL IVPB     2 g 200 mL/hr over 30 Minutes Intravenous Every 12 hours 03/17/18 1753 03/17/18 2340   03/17/18 1600  clindamycin (CLEOCIN) 900 mg, gentamicin (GARAMYCIN) 240 mg in sodium chloride 0.9 % 1,000 mL for intraperitoneal  lavage  Status:  Discontinued       As needed 03/17/18 1600 03/17/18 1647   03/17/18 1115  cefoTEtan (CEFOTAN) 2 g in sodium chloride 0.9 % 100 mL IVPB     2 g 200 mL/hr over 30 Minutes Intravenous On call to O.R. 03/17/18 1112 03/17/18 1425   03/17/18 1100  clindamycin (CLEOCIN) 900 mg, gentamicin (GARAMYCIN) 240 mg in sodium chloride 0.9 % 1,000 mL for intraperitoneal lavage  Status:  Discontinued     1 application Intraperitoneal To Surgery 03/17/18 1055 03/17/18 1056   03/17/18 1100  clindamycin (CLEOCIN) 900 mg, gentamicin (GARAMYCIN) 240 mg in sodium chloride 0.9 % 1,000 mL for intraperitoneal lavage  Intraperitoneal To Surgery 03/17/18 1056 03/18/18 1100   03/17/18 1055  cefoTEtan in Dextrose 5% (CEFOTAN) IVPB 2 g  Status:  Discontinued     2 g Intravenous On call to O.R. 03/17/18 1055 03/17/18 1112   03/17/18 1055  neomycin (MYCIFRADIN) tablet 1,000 mg  Status:  Discontinued     1,000 mg Oral 3 times per day 03/17/18 1055 03/17/18 1111   03/17/18 1055  metroNIDAZOLE (FLAGYL) tablet 1,000 mg  Status:  Discontinued     1,000 mg Oral 3 times per day 03/17/18 1055 03/17/18 1111        Note: Portions of this report may have been transcribed using voice recognition software. Every effort was made to ensure accuracy; however, inadvertent computerized transcription errors may be present.   Any transcriptional errors that result from this process are unintentional.     Adin Hector, M.D., F.A.C.S. Gastrointestinal and Minimally Invasive Surgery [facility] [address] [place], [place] [address] [phone] Main / Paging   03/18/2018

## 2018-03-18 NOTE — Evaluation (Signed)
Occupational Therapy Evaluation Patient Details Name: Tamara Gregory MRN: [id number] DOB: [birth year] Today's Date: 03/18/2018    History of Present Illness 84 yo female s/p R colectomy, R salpingo-oophorectomy 03/17/18. Hx of myasthenia gravis, colorectal cancer.   Clinical Impression   Pt admitted for above surgery. Pt currently with functional limitations due to the deficits listed below (see OT Problem List).  Pt will benefit from skilled OT to increase their safety and independence with ADL and functional mobility for ADL to facilitate discharge to venue listed below.      Follow Up Recommendations  Home health OT;Supervision/Assistance - 24 hour    Equipment Recommendations  3 in 1 bedside commode    Recommendations for Other Services       Precautions / Restrictions Precautions Precautions: Fall Restrictions Weight Bearing Restrictions: No      Mobility Bed Mobility Overal bed mobility: Needs Assistance Bed Mobility: Sidelying to Sit   Sidelying to sit: Min assist;HOB elevated Supine to sit: Min assist;HOB elevated     General bed mobility comments: pt in chair  Transfers Overall transfer level: Needs assistance Equipment used: Rolling walker (2 wheeled) Transfers: Sit to/from [facility] Sit to Stand: Min assist;Mod assist Stand pivot transfers: Min assist;Mod assist       General transfer comment: Assist to rise, stabilize, control descent. Increased time. Posterior bias. Pt c/o dizziness/spinning.  Stood statically for at least a minute or two to allow dizziness to resolve enough to attempt stand pivot.     Balance Overall balance assessment: Needs assistance         Standing balance support: Bilateral upper extremity supported Standing balance-Leahy Scale: Poor                             ADL either performed or assessed with clinical judgement   ADL Overall ADL's : Needs assistance/impaired Eating/Feeding: Set  up;Sitting   Grooming: Minimal assistance;Standing   Upper Body Bathing: Minimal assistance;Sitting   Lower Body Bathing: Maximal assistance;Sit to/from stand;Cueing for safety;Cueing for sequencing   Upper Body Dressing : Minimal assistance;Sitting   Lower Body Dressing: Maximal assistance;Sit to/from stand;Cueing for sequencing   Toilet Transfer: Moderate assistance;RW;Comfort height toilet   Toileting- Clothing Manipulation and Hygiene: Moderate assistance;Maximal assistance;Sit to/from stand         General ADL Comments: Pt stood and had liquid stool. Pt able to walk to bathroom for bathroom to sit on toilet and for bath     Vision Patient Visual Report: No change from baseline       Perception     Praxis      Pertinent Vitals/Pain Pain Assessment: Faces Faces Pain Scale: Hurts even more Pain Location: abdomen Pain Descriptors / Indicators: Sharp Pain Intervention(s): Monitored during session;Limited activity within patient's tolerance;RN gave pain meds during session     Hand Dominance     Extremity/Trunk Assessment Upper Extremity Assessment Upper Extremity Assessment: Generalized weakness   Lower Extremity Assessment Lower Extremity Assessment: Generalized weakness   Cervical / Trunk Assessment Cervical / Trunk Assessment: Kyphotic   Communication     Cognition Arousal/Alertness: Awake/alert Behavior During Therapy: WFL for tasks assessed/performed Overall Cognitive Status: Within Functional Limits for tasks assessed  Home Living Family/patient expects to be discharged to:: Unsure Living Arrangements: Spouse/significant other   Type of Home: House Home Access: Stairs to enter [employer] of Steps: 1   Home Layout: One level               Home Equipment: Walker - 4 wheels          Prior Functioning/Environment    Gait / Transfers Assistance Needed:  ambulatory with rollator              OT Problem List: Decreased strength;Decreased activity tolerance;Impaired balance (sitting and/or standing);Decreased safety awareness      OT Treatment/Interventions: Self-care/ADL training;DME and/or AE instruction;Patient/family education    OT Goals(Current goals can be found in the care plan section) Acute Rehab OT Goals Patient Stated Goal: to be able to go home OT Goal Formulation: With patient Time For Goal Achievement: 03/25/18 Potential to Achieve Goals: Good  OT Frequency: Min 2X/week   Barriers to D/C:               AM-PAC PT "6 Clicks" Daily Activity     Outcome Measure Help from another person eating meals?: None Help from another person taking care of personal grooming?: A Little Help from another person toileting, which includes using toliet, bedpan, or urinal?: A Lot Help from another person bathing (including washing, rinsing, drying)?: A Lot Help from another person to put on and taking off regular upper body clothing?: A Little Help from another person to put on and taking off regular lower body clothing?: A Lot 6 Click Score: 16   End of Session Equipment Utilized During Treatment: Rolling walker Nurse Communication: Mobility status  Activity Tolerance: Patient tolerated treatment well Patient left: in chair;with call bell/phone within reach;with family/visitor present  OT Visit Diagnosis: Unsteadiness on feet (R26.81);Muscle weakness (generalized) (M62.81);History of falling (Z91.81)                Time: 1220-1315 OT Time Calculation (min): 55 min Charges:  OT General Charges $OT Visit: 1 Visit OT Evaluation $OT Eval Moderate Complexity: 1 Mod OT Treatments $Self Care/Home Management : 38-52 mins G-Codes:     Kari Baars, [place]  Payton Mccallum D 03/18/2018, 1:26 PM

## 2018-03-18 NOTE — Evaluation (Signed)
Physical Therapy Evaluation Patient Details Name: Tamara Gregory MRN: [id number] DOB: 16-Dec-1924 Today's Date: 03/18/2018   History of Present Illness  84 yo female s/p R colectomy, R salpingo-oophorectomy 03/17/18. Hx of myasthenia gravis, colorectal cancer.  Clinical Impression  On eval, pt required Mod assist for mobility. She was able to pivot from bed to recliner with assistance. Unable to ambulate this session due to pt weakness, c/o dizziness/spinning with activity. Explained to pt and husband that is was unsafe to attempt ambulation at this time for safety reasons. Discussed d/c plan-pt would like to return home if possible. She is open to ST rehab if necessary. Will continue to follow and progress activity as tolerated. Encouraged pt to sit up as often as she can. She did receive IV pain meds just prior to PT session. This could have contributed to dizziness.     Follow Up Recommendations SNF(unless mobility improves significantly enough to safely d/c home )    Equipment Recommendations  (TBD)    Recommendations for Other Services       Precautions / Restrictions Precautions Precautions: Fall Restrictions Weight Bearing Restrictions: No      Mobility  Bed Mobility Overal bed mobility: Needs Assistance Bed Mobility: Sidelying to Sit   Sidelying to sit: Min assist;HOB elevated      General bed mobility comments: Partially in sideylying for bed mobility. Assist for trunk. Increased time. Pt c/o dizziness.   Transfers Overall transfer level: Needs assistance Equipment used: Rolling walker (2 wheeled) Transfers: Sit to/from [facility] Sit to Stand: Mod assist;From elevated surface Stand pivot transfers: Mod assist       General transfer comment: Assist to rise, stabilize, control descent. Increased time. Posterior bias. Pt c/o dizziness/spinning.  Stood statically for at least a minute or two to allow dizziness to resolve enough to attempt stand  pivot. Very unsteady.   Ambulation/Gait             General Gait Details: NT on today for safety reasons. Pt dizzy and very unsteady.   Stairs            Wheelchair Mobility    Modified Rankin (Stroke Patients Only)       Balance Overall balance assessment: Needs assistance         Standing balance support: Bilateral upper extremity supported Standing balance-Leahy Scale: Poor                               Pertinent Vitals/Pain Pain Assessment: Faces Faces Pain Scale: Hurts even more Pain Location: abdomen Pain Descriptors / Indicators: Sharp Pain Intervention(s): Monitored during session;Limited activity within patient's tolerance;RN gave pain meds during session    Home Living Family/patient expects to be discharged to:: Unsure Living Arrangements: Spouse/significant other   Type of Home: House Home Access: Stairs to enter   Technical brewer of Steps: 1 Home Layout: One level Home Equipment: Environmental consultant - 4 wheels      Prior Function     Gait / Transfers Assistance Needed: ambulatory with rollator           Hand Dominance        Extremity/Trunk Assessment   Upper Extremity Assessment Upper Extremity Assessment: Generalized weakness    Lower Extremity Assessment Lower Extremity Assessment: Generalized weakness    Cervical / Trunk Assessment Cervical / Trunk Assessment: Kyphotic  Communication      Cognition Arousal/Alertness: Awake/alert Behavior During Therapy: [facility] for  tasks assessed/performed Overall Cognitive Status: Within Functional Limits for tasks assessed                                        General Comments      Exercises     Assessment/Plan    PT Assessment Patient needs continued PT services  PT Problem List Decreased mobility;Decreased strength;Decreased balance;Decreased activity tolerance;Decreased knowledge of use of DME;Pain       PT Treatment Interventions DME  instruction;Gait training;Functional mobility training;Therapeutic activities;Balance training;Therapeutic exercise;Patient/family education    PT Goals (Current goals can be found in the Care Plan section)  Acute Rehab PT Goals Patient Stated Goal: to be able to go home PT Goal Formulation: With patient/family Time For Goal Achievement: 04/01/18 Potential to Achieve Goals: Good    Frequency Min 3X/week   Barriers to discharge        Co-evaluation               AM-PAC PT "6 Clicks" Daily Activity  Outcome Measure Difficulty turning over in bed (including adjusting bedclothes, sheets and blankets)?: Unable Difficulty moving from lying on back to sitting on the side of the bed? : Unable Difficulty sitting down on and standing up from a chair with arms (e.g., wheelchair, bedside commode, etc,.)?: Unable Help needed moving to and from a bed to chair (including a wheelchair)?: A Lot Help needed walking in hospital room?: A Lot Help needed climbing 3-5 steps with a railing? : A Lot 6 Click Score: 9    End of Session   Activity Tolerance: Patient limited by pain(limited by dizziness) Patient left: in chair;with chair alarm set;with family/visitor present;with call bell/phone within reach   PT Visit Diagnosis: Muscle weakness (generalized) (M62.81);Difficulty in walking, not elsewhere classified (R26.2);Pain Pain - part of body: (abdomen)    Time: 4562-5638 PT Time Calculation (min) (ACUTE ONLY): 24 min   Charges:   PT Evaluation $PT Eval Moderate Complexity: 1 Mod PT Treatments $Therapeutic Activity: 8-22 mins   PT G Codes:          Weston Anna, MPT Pager: [phone]

## 2018-03-19 MED ORDER — BISMUTH SUBSALICYLATE 262 MG/15ML PO SUSP: 30.0000 mL | Freq: Three times a day (TID) | ORAL | Status: DC | PRN

## 2018-03-19 MED ORDER — PSYLLIUM 95 % PO PACK
1.0000 | PACK | Freq: Two times a day (BID) | ORAL | Status: DC
  Administered 2018-03-19 – 2018-03-20 (×2): 1 via ORAL
  Filled 2018-03-19 (×2): qty 1

## 2018-03-19 MED ORDER — MAGNESIUM SULFATE 2 GM/50ML IV SOLN
2.0000 g | Freq: Once | INTRAVENOUS | Status: AC
  Administered 2018-03-19: 2 g via INTRAVENOUS
  Filled 2018-03-19: qty 50

## 2018-03-19 NOTE — Progress Notes (Signed)
Occupational Therapy Treatment Patient Details Name: Tamara Gregory MRN: [id number] DOB: Feb 26, 1925 Today's Date: 03/19/2018    History of present illness 84 yo female s/p R colectomy, R salpingo-oophorectomy 03/17/18. Hx of myasthenia gravis, colorectal cancer.   OT comments  Pts BP 112/67 with OT after several sit to stands, BUE exercise and toe tapping.  After walking with OT BP 116/67  Follow Up Recommendations  Home health OT;Supervision/Assistance - 24 hour    Equipment Recommendations  3 in 1 bedside commode    Recommendations for Other Services      Precautions / Restrictions Precautions Precautions: Fall Restrictions Weight Bearing Restrictions: No       Mobility Bed Mobility               General bed mobility comments: pt in chair  Transfers Overall transfer level: Needs assistance Equipment used: Rolling walker (2 wheeled) Transfers: Sit to/from [facility] Sit to Stand: Min assist Stand pivot transfers: Min assist       General transfer comment: VC for hand placement    Balance Overall balance assessment: Needs assistance         Standing balance support: Bilateral upper extremity supported Standing balance-Leahy Scale: Fair                             ADL either performed or assessed with clinical judgement   ADL Overall ADL's : Needs assistance/impaired Eating/Feeding: Set up;Sitting   Grooming: Standing;Min guard           Upper Body Dressing : Set up;Sitting   Lower Body Dressing: Moderate assistance;Sit to/from stand;Cueing for sequencing;Cueing for safety   Toilet Transfer: Minimal assistance;RW;Comfort height toilet   Toileting- Clothing Manipulation and Hygiene: Minimal assistance;Sit to/from stand;Cueing for sequencing;Cueing for safety         General ADL Comments: Pt improved this day!  Pt able to walk in hall with OT as well as the above. BP 112/67     Vision Patient Visual Report:  No change from baseline     Perception     Praxis      Cognition Arousal/Alertness: Awake/alert Behavior During Therapy: WFL for tasks assessed/performed Overall Cognitive Status: Within Functional Limits for tasks assessed                                 General Comments: OT did note some memory issues during session such as not recalling event of OT session yesterday.                     Pertinent Vitals/ Pain       Pain Score: 3  Pain Location: abdomen Pain Descriptors / Indicators: Discomfort;Sore Pain Intervention(s): Limited activity within patient's tolerance;Monitored during session         Frequency  Min 2X/week        Progress Toward Goals  OT Goals(current goals can now be found in the care plan section)  Progress towards OT goals: Progressing toward goals     Plan Discharge plan remains appropriate    Co-evaluation                 AM-PAC PT "6 Clicks" Daily Activity     Outcome Measure   Help from another person eating meals?: None Help from another person taking care of personal grooming?: A Little Help from another  person toileting, which includes using toliet, bedpan, or urinal?: A Little Help from another person bathing (including washing, rinsing, drying)?: A Little Help from another person to put on and taking off regular upper body clothing?: A Little Help from another person to put on and taking off regular lower body clothing?: A Lot 6 Click Score: 18    End of Session Equipment Utilized During Treatment: Rolling walker  OT Visit Diagnosis: Unsteadiness on feet (R26.81);Muscle weakness (generalized) (M62.81);History of falling (Z91.81)   Activity Tolerance Patient tolerated treatment well   Patient Left in chair;with call bell/phone within reach;with family/visitor present   Nurse Communication Mobility status        Time: 4496-7591 OT Time Calculation (min): 35 min  Charges: OT General Charges $OT  Visit: 1 Visit OT Treatments $Self Care/Home Management : 23-37 mins  [place], [place]   Betsy Pries 03/19/2018, 11:57 AM

## 2018-03-19 NOTE — Anesthesia Postprocedure Evaluation (Signed)
Anesthesia Post Note  Patient: Tamara Gregory  Procedure(s) Performed: ROBOTIC RIGHT COLECTOMY, RIGHT RSO (Right Abdomen)     Patient location during evaluation: PACU Anesthesia Type: General Level of consciousness: awake and alert Pain management: pain level controlled Vital Signs Assessment: post-procedure vital signs reviewed and stable Respiratory status: spontaneous breathing, nonlabored ventilation, respiratory function stable and patient connected to nasal cannula oxygen Cardiovascular status: blood pressure returned to baseline and stable Postop Assessment: no apparent nausea or vomiting Anesthetic complications: no    Last Vitals:  Vitals:   03/18/18 2001 03/19/18 0559  BP: (!) 158/73 136/74  Pulse: 69 70  Resp: 16 16  Temp: 37.1 C 37.2 C  SpO2: 94% 91%    Last Pain:  Vitals:   03/19/18 0559  TempSrc: Oral  PainSc:                  [place]

## 2018-03-19 NOTE — Progress Notes (Signed)
Pharmacy Brief Note - Alvimopan (Entereg)  The standing order set for alvimopan (Entereg) now includes an automatic order to discontinue the drug after the patient has had a bowel movement. The change was approved by the [place] and the Medical Executive Committee.   This patient has had bowel movements documented by nursing. Therefore, alvimopan has been discontinued. If there are questions, please contact the pharmacy at [phone].   Thank you-  Dia Sitter, PharmD, BCPS 03/19/2018 8:50 AM

## 2018-03-19 NOTE — Progress Notes (Signed)
Initial Nutrition Assessment  INTERVENTION:   -Continue Ensure Surgery BID, each provides 330 kcal and 18g protein. -Encouraged PO intakes  NUTRITION DIAGNOSIS:   Inadequate oral intake related to poor appetite as evidenced by per patient/family report.  GOAL:   Patient will meet greater than or equal to 90% of their needs  MONITOR:   PO intake, Supplement acceptance, Labs, Weight trends, I & O's  REASON FOR ASSESSMENT:   Malnutrition Screening Tool    ASSESSMENT:   84 y.o. patient with Cecal cancer, right ovarian cyst. History of myasthenia gravis. S/p ROBOTIC PROXIMAL "RIGHT" COLECTOMY, RIGHT SALPINGO-OOPHERECTOMY,EXCISION OF SKIN MASS  on 4/16.  Patient in room with husband at bedside. Pt reports she is eating better now, was on a dysphagia 1 diet and now on solid food. States she is eating this consistency without issue. Pt ate a bagel, eggs and yogurt for breakfast. Pt states she was not eating well at home "for months". She was drinking Ensure supplements at home. She is drinking Ensure Surgery here.  Patient was unable to recall UBW. States she lost 10 lb of her UBW. Per chart review, pt has lost 4 lb since December 2018.    Medications: Ferrous sulfate tablet daily, Ferrous sulfate tablet daily, Prosight MVI daily, K-DUR tablet daily, Metamucil packet BID Labs reviewed: CBGs:154 Low K, Mg   NUTRITION - FOCUSED PHYSICAL EXAM:  Deferred NFPE at this time. Pt about to work with OT. Suspect any depletions are more related to medical condition and advanced age.  Diet Order:  Diet Heart Room service appropriate? Yes; Fluid consistency: Thin  EDUCATION NEEDS:   Education needs have been addressed  Skin:  Skin Assessment: Reviewed RN Assessment  Last BM:  4/17  Height:   Ht Readings from Last 1 Encounters:  03/17/18 5\' 2"  (1.575 m)    Weight:   Wt Readings from Last 1 Encounters:  03/19/18 147 lb 4.3 oz (66.8 kg)    Ideal Body Weight:  50 kg  BMI:   Body mass index is 26.94 kg/m.  Estimated Nutritional Needs:   Kcal:  1400-1600  Protein:  65-75g  Fluid:  1.6L/day  Clayton Bibles, MS, RD, LDN [place] Dietitian Pager: [phone] After Hours Pager: [phone]

## 2018-03-19 NOTE — Progress Notes (Signed)
[address] [place], [place] [address] Phone: [phone]  FAX: [phone]      Tamara Gregory [id number] Mar 20, 1925  CARE TEAM:  PCP: Binnie Rail, MD  Outpatient Care Team: Patient Care Team: Binnie Rail, MD as PCP - General (Internal Medicine) Nahser, Wonda Cheng, MD as PCP - Cardiology (Cardiology) Michael Boston, MD as Consulting Physician (General Surgery) Milus Banister, MD as Attending Physician (Gastroenterology) Nahser, Wonda Cheng, MD as Consulting Physician (Cardiology) Alda Berthold, DO as Consulting Physician (Neurology)  Inpatient Treatment Team: Treatment Team: Attending Provider: Michael Boston, MD; Technician: Etheleen Sia, NT; Occupational Therapist: Betsy Pries, OT; Registered Nurse: Mortimer Fries, RN; Technician: Sherrill Raring, NT; Technician: Leda Quail, NT   Problem List:   Principal Problem:   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 Active Problems:   Pure hypercholesterolemia   Benign hypertensive heart disease without heart failure   Osteoarthritis   Myasthenia gravis ([place])   Spinal stenosis, lumbar region, with neurogenic claudication   Depression (emotion)   Insomnia   GERD (gastroesophageal reflux disease)   Mixed incontinence urge and stress   Essential hypertension, benign   Long term current use of systemic steroids   Squamous cell skin cancer   Right ovarian cyst s/p RSO   Hypokalemia   2 Days Post-Op  03/17/2018  POST-OPERATIVE DIAGNOSIS:    Cecal cancer Right ovarian cyst Skin mass with h/o skin cancer  PROCEDURE:    ROBOTIC PROXIMAL "RIGHT" COLECTOMY RIGHT SALPINGO-OOPHERECTOMY EXCISION OF SKIN MASS  SURGEON:  Adin Hector, MD, FACS, MASCRS      Assessment  Recovering okay  Plan:  -adv diet per ERAS protocol - solid diet -stop & follow off IVF -f/u path -Myasthenia gravis - baseline  steroid -bowel regimen - restart fiber to thicken up BMs -HTN - restart home meds & follow -hypoK - replacing & check Mg -VTE prophylaxis- SCDs, etc -mobilize as tolerated to help recovery  20 minutes spent in review, evaluation, examination, counseling, and coordination of care.  More than 50% of that time was spent in counseling.  Adin Hector, M.D., F.A.C.S. Gastrointestinal and Minimally Invasive Surgery [facility] [address], [place], [place] [address] [phone] Main / Paging   03/19/2018    Subjective: (Chief complaint)   Felt too weak to walk much Up in chair a lot Husband in room Wants solids  Objective:  Vital signs:  Vitals:   03/18/18 1836 03/18/18 2001 03/19/18 0423 03/19/18 0559  BP: 120/67 (!) 158/73  136/74  Pulse: 68 69  70  Resp: '16 16  16  ' Temp: 98.6 F (37 C) 98.7 F (37.1 C)  99 F (37.2 C)  TempSrc: Oral Oral  Oral  SpO2: 90% 94%  91%  Weight:   147 lb 4.3 oz (66.8 kg)   Height:        Last BM Date: 03/18/18  Intake/Output   Yesterday:  04/17 0701 - 04/18 0700 In: 460 [P.O.:460] Out: 2 [Stool:2] This shift:  No intake/output data recorded.  Bowel function:  Flatus: YES  BM:  YES - loose  Drain: (No drain)   Physical Exam:  General: Pt awake/alert/oriented x4 in no acute distress.  Smiling. Eyes: PERRL, normal EOM.  Sclera clear.  No icterus Neuro: CN II-XII intact w/o focal sensory/motor deficits. Lymph: No head/neck/groin lymphadenopathy Psych:  No delerium/psychosis/paranoia HENT: Normocephalic, Mucus membranes moist.  No thrush Neck: Supple, No tracheal deviation Chest: No chest wall pain w good excursion CV:  Pulses intact.  Regular rhythm MS: Normal AROM mjr joints.  No obvious deformity  Abdomen: Soft.  Nondistended.  Mildly tender at incisions only.  No evidence of peritonitis.  No incarcerated hernias.  Ext:  No deformity.  No mjr edema.  No cyanosis Skin: No  petechiae / purpura  Results:   Labs: Results for orders placed or performed during the hospital encounter of 03/17/18 (from the past 48 hour(s))  Glucose, capillary     Status: Abnormal   Collection Time: 03/17/18  7:55 PM  Result Value Ref Range   Glucose-Capillary 154 (H) 65 - 99 mg/dL  Basic metabolic panel     Status: Abnormal   Collection Time: 03/18/18  4:45 AM  Result Value Ref Range   Sodium 141 135 - 145 mmol/L   Potassium 3.4 (L) 3.5 - 5.1 mmol/L   Chloride 106 101 - 111 mmol/L   CO2 24 22 - 32 mmol/L   Glucose, Bld 172 (H) 65 - 99 mg/dL   BUN 8 6 - 20 mg/dL   Creatinine, Ser 0.73 0.44 - 1.00 mg/dL   Calcium 8.5 (L) 8.9 - 10.3 mg/dL   GFR calc non Af Amer >60 >60 mL/min   GFR calc Af Amer >60 >60 mL/min    Comment: (NOTE) The eGFR has been calculated using the CKD EPI equation. This calculation has not been validated in all clinical situations. eGFR's persistently <60 mL/min signify possible Chronic Kidney Disease.    Anion gap 11 5 - 15    Comment: Performed at [facility], [place] [address] [place], [place] [address]  CBC     Status: Abnormal   Collection Time: 03/18/18  4:45 AM  Result Value Ref Range   WBC 12.8 (H) 4.0 - 10.5 K/uL   RBC 2.91 (L) 3.87 - 5.11 MIL/uL   Hemoglobin 8.1 (L) 12.0 - 15.0 g/dL   HCT 26.6 (L) 36.0 - 46.0 %   MCV 91.4 78.0 - 100.0 fL   MCH 27.8 26.0 - 34.0 pg   MCHC 30.5 30.0 - 36.0 g/dL   RDW 19.2 (H) 11.5 - 15.5 %   Platelets 315 150 - 400 K/uL    Comment: Performed at [facility], [place] [address] [place], [place] [address]  Magnesium     Status: Abnormal   Collection Time: 03/18/18  4:45 AM  Result Value Ref Range   Magnesium 1.6 (L) 1.7 - 2.4 mg/dL    Comment: Performed at [facility], [place] [address] [place], [place] [address]    Imaging / Studies: No results found.  Medications / Allergies: per chart  Antibiotics: Anti-infectives (From admission, onward)    Start     Dose/Rate Route Frequency Ordered Stop   03/17/18 2200  cefoTEtan (CEFOTAN) 2 g in sodium chloride 0.9 % 100 mL IVPB     2 g 200 mL/hr over 30 Minutes Intravenous Every 12 hours 03/17/18 1753 03/17/18 2340   03/17/18 1600  clindamycin (CLEOCIN) 900 mg, gentamicin (GARAMYCIN) 240 mg in sodium chloride 0.9 % 1,000 mL for intraperitoneal lavage  Status:  Discontinued       As needed 03/17/18 1600 03/17/18 1647   03/17/18 1115  cefoTEtan (CEFOTAN) 2 g in sodium chloride 0.9 % 100 mL IVPB     2 g 200 mL/hr over 30 Minutes Intravenous On call to O.R. 03/17/18 1112 03/17/18  1425   03/17/18 1100  clindamycin (CLEOCIN) 900 mg, gentamicin (GARAMYCIN) 240 mg in sodium chloride 0.9 % 1,000 mL for intraperitoneal lavage  Status:  Discontinued     1 application Intraperitoneal To Surgery 03/17/18 1055 03/17/18 1056   03/17/18 1100  clindamycin (CLEOCIN) 900 mg, gentamicin (GARAMYCIN) 240 mg in sodium chloride 0.9 % 1,000 mL for intraperitoneal lavage      Intraperitoneal To Surgery 03/17/18 1056 03/18/18 1100   03/17/18 1055  cefoTEtan in Dextrose 5% (CEFOTAN) IVPB 2 g  Status:  Discontinued     2 g Intravenous On call to O.R. 03/17/18 1055 03/17/18 1112   03/17/18 1055  neomycin (MYCIFRADIN) tablet 1,000 mg  Status:  Discontinued     1,000 mg Oral 3 times per day 03/17/18 1055 03/17/18 1111   03/17/18 1055  metroNIDAZOLE (FLAGYL) tablet 1,000 mg  Status:  Discontinued     1,000 mg Oral 3 times per day 03/17/18 1055 03/17/18 1111        Note: Portions of this report may have been transcribed using voice recognition software. Every effort was made to ensure accuracy; however, inadvertent computerized transcription errors may be present.   Any transcriptional errors that result from this process are unintentional.     Adin Hector, M.D., F.A.C.S. Gastrointestinal and Minimally Invasive Surgery [facility] [address], [place],  [address] [phone] Main / Paging   03/19/2018

## 2018-03-20 LAB — CREATININE, SERUM: Creatinine, Ser: 0.68 mg/dL (ref 0.44–1.00)

## 2018-03-20 LAB — HEMOGLOBIN: Hemoglobin: 8.1 g/dL — ABNORMAL LOW (ref 12.0–15.0)

## 2018-03-20 LAB — MAGNESIUM: MAGNESIUM: 1.6 mg/dL — AB (ref 1.7–2.4)

## 2018-03-20 LAB — POTASSIUM: POTASSIUM: 3.5 mmol/L (ref 3.5–5.1)

## 2018-03-20 MED ORDER — ASPIRIN 81 MG PO CHEW: 40.5000 mg | CHEWABLE_TABLET | Freq: Every day | ORAL | 0 refills | Status: AC

## 2018-03-20 MED ORDER — GABAPENTIN 300 MG PO CAPS: 300.0000 mg | ORAL_CAPSULE | Freq: Two times a day (BID) | ORAL | 1 refills | Status: DC

## 2018-03-20 MED ORDER — ACETAMINOPHEN 500 MG PO TABS: 1000.0000 mg | ORAL_TABLET | Freq: Three times a day (TID) | ORAL | 0 refills | Status: DC

## 2018-03-20 NOTE — Discharge Instructions (Signed)
SURGERY: POST OP INSTRUCTIONS °(Surgery for small bowel obstruction, colon resection, etc) ° ° °###################################################################### ° °EAT °Gradually transition to a high fiber diet with a fiber supplement over the next few days after discharge ° °WALK °Walk an hour a day.  Control your pain to do that.   ° °CONTROL PAIN °Control pain so that you can walk, sleep, tolerate sneezing/coughing, go up/down stairs. ° °HAVE A BOWEL MOVEMENT DAILY °Keep your bowels regular to avoid problems.  OK to try a laxative to override constipation.  OK to use an antidairrheal to slow down diarrhea.  Call if not better after 2 tries ° °CALL IF YOU HAVE PROBLEMS/CONCERNS °Call if you are still struggling despite following these instructions. °Call if you have concerns not answered by these instructions ° °###################################################################### ° ° °DIET °Follow a light diet the first few days at home.  Start with a bland diet such as soups, liquids, starchy foods, low fat foods, etc.  If you feel full, bloated, or constipated, stay on a ful liquid or pureed/blenderized diet for a few days until you feel better and no longer constipated. °Be sure to drink plenty of fluids every day to avoid getting dehydrated (feeling dizzy, not urinating, etc.). °Gradually add a fiber supplement to your diet over the next week.  Gradually get back to a regular solid diet.  Avoid fast food or heavy meals the first week as you are more likely to get nauseated. °It is expected for your digestive tract to need a few months to get back to normal.  It is common for your bowel movements and stools to be irregular.  You will have occasional bloating and cramping that should eventually fade away.  Until you are eating solid food normally, off all pain medications, and back to regular activities; your bowels will not be normal. °Focus on eating a low-fat, high fiber diet the rest of your life  (See Getting to Good Bowel Health, below). ° °CARE of your INCISION or WOUND °It is good for closed incision and even open wounds to be washed every day.  Shower every day.  Short baths are fine.  Wash the incisions and wounds clean with soap & water.    °If you have a closed incision(s), wash the incision with soap & water every day.  You may leave closed incisions open to air if it is dry.   You may cover the incision with clean gauze & replace it after your daily shower for comfort. °If you have skin tapes (Steristrips) or skin glue (Dermabond) on your incision, leave them in place.  They will fall off on their own like a scab.  You may trim any edges that curl up with clean scissors.  If you have staples, set up an appointment for them to be removed in the office in 10 days after surgery.  °If you have a drain, wash around the skin exit site with soap & water and place a new dressing of gauze or band aid around the skin every day.  Keep the drain site clean & dry.    °If you have an open wound with packing, see wound care instructions.  In general, it is encouraged that you remove your dressing and packing, shower with soap & water, and replace your dressing once a day.  Pack the wound with clean gauze moistened with normal (0.9%) saline to keep the wound moist & uninfected.  Pressure on the dressing for 30 minutes will stop most wound   bleeding.  Eventually your body will heal & pull the open wound closed over the next few months.  °Raw open wounds will occasionally bleed or secrete yellow drainage until it heals closed.  Drain sites will drain a little until the drain is removed.  Even closed incisions can have mild bleeding or drainage the first few days until the skin edges scab over & seal.   °If you have an open wound with a wound vac, see wound vac care instructions. ° ° ° ° °ACTIVITIES as tolerated °Start light daily activities --- self-care, walking, climbing stairs-- beginning the day after surgery.   Gradually increase activities as tolerated.  Control your pain to be active.  Stop when you are tired.  Ideally, walk several times a day, eventually an hour a day.   °Most people are back to most day-to-day activities in a few weeks.  It takes 4-8 weeks to get back to unrestricted, intense activity. °If you can walk 30 minutes without difficulty, it is safe to try more intense activity such as jogging, treadmill, bicycling, low-impact aerobics, swimming, etc. °Save the most intensive and strenuous activity for last (Usually 4-8 weeks after surgery) such as sit-ups, heavy lifting, contact sports, etc.  Refrain from any intense heavy lifting or straining until you are off narcotics for pain control.  You will have off days, but things should improve week-by-week. °DO NOT PUSH THROUGH PAIN.  Let pain be your guide: If it hurts to do something, don't do it.  Pain is your body warning you to avoid that activity for another week until the pain goes down. °You may drive when you are no longer taking narcotic prescription pain medication, you can comfortably wear a seatbelt, and you can safely make sudden turns/stops to protect yourself without hesitating due to pain. °You may have sexual intercourse when it is comfortable. If it hurts to do something, stop. ° °MEDICATIONS °Take your usually prescribed home medications unless otherwise directed.   °Blood thinners:  °Usually you can restart any strong blood thinners after the second postoperative day.  It is OK to take aspirin right away.    ° If you are on strong blood thinners (warfarin/Coumadin, Plavix, Xerelto, Eliquis, Pradaxa, etc), discuss with your surgeon, medicine PCP, and/or cardiologist for instructions on when to restart the blood thinner & if blood monitoring is needed (PT/INR blood check, etc).   ° ° °PAIN CONTROL °Pain after surgery or related to activity is often due to strain/injury to muscle, tendon, nerves and/or incisions.  This pain is usually  short-term and will improve in a few months.  °To help speed the process of healing and to get back to regular activity more quickly, DO THE FOLLOWING THINGS TOGETHER: °1. Increase activity gradually.  DO NOT PUSH THROUGH PAIN °2. Use Ice and/or Heat °3. Try Gentle Massage and/or Stretching °4. Take over the counter pain medication °5. Take Narcotic prescription pain medication for more severe pain ° °Good pain control = faster recovery.  It is better to take more medicine to be more active than to stay in bed all day to avoid medications. °1.  Increase activity gradually °Avoid heavy lifting at first, then increase to lifting as tolerated over the next 6 weeks. °Do not “push through” the pain.  Listen to your body and avoid positions and maneuvers than reproduce the pain.  Wait a few days before trying something more intense °Walking an hour a day is encouraged to help your body recover faster   and more safely.  Start slowly and stop when getting sore.  If you can walk 30 minutes without stopping or pain, you can try more intense activity (running, jogging, aerobics, cycling, swimming, treadmill, sex, sports, weightlifting, etc.) °Remember: If it hurts to do it, then don’t do it! °2. Use Ice and/or Heat °You will have swelling and bruising around the incisions.  This will take several weeks to resolve. °Ice packs or heating pads (6-8 times a day, 30-60 minutes at a time) will help sooth soreness & bruising. °Some people prefer to use ice alone, heat alone, or alternate between ice & heat.  Experiment and see what works best for you.  Consider trying ice for the first few days to help decrease swelling and bruising; then, switch to heat to help relax sore spots and speed recovery. °Shower every day.  Short baths are fine.  It feels good!  Keep the incisions and wounds clean with soap & water.   °3. Try Gentle Massage and/or Stretching °Massage at the area of pain many times a day °Stop if you feel pain - do not  overdo it °4. Take over the counter pain medication °This helps the muscle and nerve tissues become less irritable and calm down faster °Choose ONE of the following over-the-counter anti-inflammatory medications: °Acetaminophen 500mg tabs (Tylenol) 1-2 pills with every meal and just before bedtime (avoid if you have liver problems or if you have acetaminophen in you narcotic prescription) °Naproxen 220mg tabs (ex. Aleve, Naprosyn) 1-2 pills twice a day (avoid if you have kidney, stomach, IBD, or bleeding problems) °Ibuprofen 200mg tabs (ex. Advil, Motrin) 3-4 pills with every meal and just before bedtime (avoid if you have kidney, stomach, IBD, or bleeding problems) °Take with food/snack several times a day as directed for at least 2 weeks to help keep pain / soreness down & more manageable. °5. Take Narcotic prescription pain medication for more severe pain °A prescription for strong pain control is often given to you upon discharge (for example: oxycodone/Percocet, hydrocodone/Norco/Vicodin, or tramadol/Ultram) °Take your pain medication as prescribed. °Be mindful that most narcotic prescriptions contain Tylenol (acetaminophen) as well - avoid taking too much Tylenol. °If you are having problems/concerns with the prescription medicine (does not control pain, nausea, vomiting, rash, itching, etc.), please call us [phone] to see if we need to switch you to a different pain medicine that will work better for you and/or control your side effects better. °If you need a refill on your pain medication, you must call the office before 4 pm and on weekdays only.  By federal law, prescriptions for narcotics cannot be called into a pharmacy.  They must be filled out on paper & picked up from our office by the patient or authorized caretaker.  Prescriptions cannot be filled after 4 pm nor on weekends.   ° °WHEN TO CALL US [phone] °Severe uncontrolled or worsening pain  °Fever over 101 F (38.5 C) °Concerns with  the incision: Worsening pain, redness, rash/hives, swelling, bleeding, or drainage °Reactions / problems with new medications (itching, rash, hives, nausea, etc.) °Nausea and/or vomiting °Difficulty urinating °Difficulty breathing °Worsening fatigue, dizziness, lightheadedness, blurred vision °Other concerns °If you are not getting better after two weeks or are noticing you are getting worse, contact our office [phone] for further advice.  We may need to adjust your medications, re-evaluate you in the office, send you to the emergency room, or see what other things we can do to help. °The   clinic staff is available to answer your questions during regular business hours (8:30am-5pm).  Please don’t hesitate to call and ask to speak to one of our nurses for clinical concerns.    °A surgeon from [facility] is always on call at the hospitals 24 hours/day °If you have a medical emergency, go to the nearest emergency room or call 911. ° °FOLLOW UP in our office °One the day of your discharge from the hospital (or the next business weekday), please call [facility] to set up or confirm an appointment to see your surgeon in the office for a follow-up appointment.  Usually it is 2-3 weeks after your surgery.   °If you have skin staples at your incision(s), let the office know so we can set up a time in the office for the nurse to remove them (usually around 10 days after surgery). °Make sure that you call for appointments the day of discharge (or the next business weekday) from the hospital to ensure a convenient appointment time. °IF YOU HAVE DISABILITY OR FAMILY LEAVE FORMS, BRING THEM TO THE OFFICE FOR PROCESSING.  DO NOT GIVE THEM TO YOUR DOCTOR. ° °[facility] °[address], [address], [place], [place]  [address] ? °[phone] - Main °[phone] - Toll Free,  [phone] - Fax °[email or web address] ° °GETTING TO GOOD BOWEL HEALTH. °It is  expected for your digestive tract to need a few months to get back to normal.  It is common for your bowel movements and stools to be irregular.  You will have occasional bloating and cramping that should eventually fade away.  Until you are eating solid food normally, off all pain medications, and back to regular activities; your bowels will not be normal.   °Avoiding constipation °The goal: ONE SOFT BOWEL MOVEMENT A DAY!    °Drink plenty of fluids.  Choose water first. °TAKE A FIBER SUPPLEMENT EVERY DAY THE REST OF YOUR LIFE °During your first week back home, gradually add back a fiber supplement every day °Experiment which form you can tolerate.   There are many forms such as powders, tablets, wafers, gummies, etc °Psyllium bran (Metamucil), methylcellulose (Citrucel), Miralax or Glycolax, Benefiber, Flax Seed.  °Adjust the dose week-by-week (1/2 dose/day to 6 doses a day) until you are moving your bowels 1-2 times a day.  Cut back the dose or try a different fiber product if it is giving you problems such as diarrhea or bloating. °Sometimes a laxative is needed to help jump-start bowels if constipated until the fiber supplement can help regulate your bowels.  If you are tolerating eating & you are farting, it is okay to try a gentle laxative such as double dose MiraLax, prune juice, or Milk of Magnesia.  Avoid using laxatives too often. °Stool softeners can sometimes help counteract the constipating effects of narcotic pain medicines.  It can also cause diarrhea, so avoid using for too long. °If you are still constipated despite taking fiber daily, eating solids, and a few doses of laxatives, call our office. °Controlling diarrhea °Try drinking liquids and eating bland foods for a few days to avoid stressing your intestines further. °Avoid dairy products (especially milk & ice cream) for a short time.  The intestines often can lose the ability to digest lactose when stressed. °Avoid foods that cause gassiness or  bloating.  Typical foods include beans and other legumes, cabbage, broccoli, and dairy foods.  Avoid greasy, spicy, fast foods.  Every person has   some sensitivity to other foods, so listen to your body and avoid those foods that trigger problems for you. °Probiotics (such as active yogurt, Align, etc) may help repopulate the intestines and colon with normal bacteria and calm down a sensitive digestive tract °Adding a fiber supplement gradually can help thicken stools by absorbing excess fluid and retrain the intestines to act more normally.  Slowly increase the dose over a few weeks.  Too much fiber too soon can backfire and cause cramping & bloating. °It is okay to try and slow down diarrhea with a few doses of antidiarrheal medicines.   °Bismuth subsalicylate (ex. Kayopectate, Pepto Bismol) for a few doses can help control diarrhea.  Avoid if pregnant.   °Loperamide (Imodium) can slow down diarrhea.  Start with one tablet (2mg) first.  Avoid if you are having fevers or severe pain.  °ILEOSTOMY PATIENTS WILL HAVE CHRONIC DIARRHEA since their colon is not in use.    °Drink plenty of liquids.  You will need to drink even more glasses of water/liquid a day to avoid getting dehydrated. °Record output from your ileostomy.  Expect to empty the bag every 3-4 hours at first.  Most people with a permanent ileostomy empty their bag 4-6 times at the least.   °Use antidiarrheal medicine (especially Imodium) several times a day to avoid getting dehydrated.  Start with a dose at bedtime & breakfast.  Adjust up or down as needed.  Increase antidiarrheal medications as directed to avoid emptying the bag more than 8 times a day (every 3 hours). °Work with your wound ostomy nurse to learn care for your ostomy.  See ostomy care instructions. °TROUBLESHOOTING IRREGULAR BOWELS °1) Start with a soft & bland diet. No spicy, greasy, or fried foods.  °2) Avoid gluten/wheat or dairy products from diet to see if symptoms improve. °3) Miralax  17gm or flax seed mixed in 8oz. water or juice-daily. May use 2-4 times a day as needed. °4) Gas-X, Phazyme, etc. as needed for gas & bloating.  °5) Prilosec (omeprazole) over-the-counter as needed °6)  Consider probiotics (Align, Activa, etc) to help calm the bowels down ° °Call your doctor if you are getting worse or not getting better.  Sometimes further testing (cultures, endoscopy, X-ray studies, CT scans, bloodwork, etc.) may be needed to help diagnose and treat the cause of the diarrhea. °[facility] °[address], [address], [place], [place]  [address] °[phone] - Main.    °[phone]  - Toll Free.   [phone] - Fax °[email or web address] ° ° ° °Colorectal Cancer °Colorectal cancer is an abnormal growth of cells and tissue (tumor) in the colon or rectum, which are parts of the large intestine. The cancer can spread (metastasize) to other parts of the body. °What are the causes? °Most cases of colorectal cancer start as abnormal growths called polyps on the inner wall of the colon or rectum. Other times, abnormal changes to genes (genetic mutations) can cause cells to form cancer. °What increases the risk? °You are more likely to develop this condition if: °· You are older than age 50. °· You have multiple polyps in the colon or rectum. °· You have diabetes. °· You are African American. °· You have a family history of Lynch syndrome. °· You have had cancer before. °· You have certain hereditary conditions, such as: °? Familial adenomatous polyposis. °? Turcot syndrome. °? Peutz-Jeghers syndrome. °· You eat a diet that is high in fat (especially animal fat) and   low in fiber, fruits, and vegetables. °· You have an inactive (sedentary) lifestyle. °· You have an inflammatory bowel disease or Crohn disease. °· You smoke. °· You drink alcohol excessively. ° °What are the signs or symptoms? °Early colorectal cancer often does not cause symptoms. As the cancer grows,  symptoms may include: °· Changes in bowel habits. °· Feeling like the bowel does not empty completely after a bowel movement. °· Stools that are narrower than usual. °· Blood in the stool. °· Diarrhea. °· Constipation. °· Anemia. °· Discomfort, pain, bloating, fullness, or cramps in the abdomen. °· Frequent gas pain. °· Unexplained weight loss. °· Constant fatigue. °· Nausea and vomiting. ° °How is this diagnosed? °This condition may be diagnosed with: °· A medical history. °· A physical exam. °· Tests. These may include: °? A digital rectal exam. °? A stool test called a fecal occult blood test. °? Blood tests. °? A test in which a tissue sample is taken from the colon or rectum and examined under a microscope (biopsy). °? Imaging tests, such as: °§ X-rays. °§ A barium enema. °§ CT scans. °§ MRIs. °§ A sigmoidoscopy. This test is done to view the inside of the rectum. °§ A colonoscopy. This test is done to view the inside of the colon. During this test, small polyps can be removed or biopsies may be taken. °§ An endorectal ultrasound. This test checks how deep a tumor in the rectum has grown and whether the cancer has spread to lymph nodes or other nearby tissues. ° °Your health care provider may order additional tests to find out whether the cancer has spread to other parts of the body (what stage it is). The stages of cancer include: °· Stage 0. At this stage, the cancer is found only in the innermost lining of the colon or rectum. °· Stage I. At this stage, the cancer has grown into the inner wall of the colon or rectum. °· Stage II. At this stage, the cancer has gone more deeply into the wall of the colon or rectum or through the wall. It may have invaded nearby tissue. °· Stage III. At this stage, the cancer has spread to nearby lymph nodes. °· Stage IV. At this stage, the cancer has spread to other parts of the body, such as the liver or lungs. ° °How is this treated? °Treatment for this condition depends on  the type and stage of the cancer. Treatment may include: °· Surgery. In the early stages of the cancer, surgery may be done to remove polyps or small tumors from the colon. In later stages, surgery may be done to remove part of the colon. °· Chemotherapy. This treatment uses medicines to kill cancer cells. °· Targeted therapy. This treatment targets specific gene mutations or proteins that the cancer expresses in order to kill tumor cells. °· Radiation therapy. This treatment uses radiation to kill cancer cells or shrink tumors. °· Radiofrequency ablation. This treatment uses radio waves to destroy the tumors that may have spread to other areas of the body, such as the liver. ° °Follow these instructions at home: °· Take over-the-counter and prescription medicines only as told by your health care provider. °· Try to eat regular, healthy meals. Some of your treatments might affect your appetite. If you are having problems eating or with your appetite, ask to meet with a food and nutrition specialist (dietitian). °· Consider joining a support group. This may help you learn about your diagnosis and cope   with the stress of having colorectal cancer. °· If you are admitted to the hospital, inform your cancer care team. °· Keep all follow-up visits as told by your health care provider. This is important. °How is this prevented? °· Colorectal cancer can be prevented with screening tests that find polyps so they can be removed before they develop into cancer. °· Most people with average risk of colorectal cancer should start screening at age 50. People at increased risk should start screening at an earlier age. °Where to find more information: °· American Cancer Society: https://www.cancer.org °· National Cancer Institute (NCI): https://www.cancer.gov °Contact a health care provider if: °· Your diarrhea or constipation does not go away. °· You have blood in your stool. °· Your bowel habits change. °· You have increased pain  in your abdomen. °· You notice new fatigue or weakness. °· You lose weight. °Get help right away if: °· You have increased bleeding from your rectum. °· You have any uncontrollable or severe abdomen (abdominal) symptoms. °Summary °· Colorectal cancer is an abnormal growth of cells and tissue (tumor) in the colon or rectum. °· Common risk factors for this condition include having a relative with colon cancer, being older in age, having an inflammatory bowel disease, and being African American. °· This condition may be diagnosed with tests, such as a colonoscopy and biopsy. °· Treatment depends on the type and stage of the cancer. Commonly, treatment includes surgery to remove the tumor along with chemotherapy or targeted therapy. °· Keep all follow-up visits as told by your health care provider. This is important. °This information is not intended to replace advice given to you by your health care provider. Make sure you discuss any questions you have with your health care provider. °Document Released: 11/18/2005 Document Revised: 12/20/2016 Document Reviewed: 12/20/2016 °[employer] Interactive Patient Education © 2018 [employer] ° °

## 2018-03-20 NOTE — Progress Notes (Signed)
[facility]  [address] [place], [place] [address] Phone: [phone]  FAX: [phone]      EDANA AGUADO [id number] May 01, 1925  CARE TEAM:  PCP: Binnie Rail, MD  Outpatient Care Team: Patient Care Team: Binnie Rail, MD as PCP - General (Internal Medicine) Nahser, Wonda Cheng, MD as PCP - Cardiology (Cardiology) Michael Boston, MD as Consulting Physician (General Surgery) Milus Banister, MD as Attending Physician (Gastroenterology) Nahser, Wonda Cheng, MD as Consulting Physician (Cardiology) Alda Berthold, DO as Consulting Physician (Neurology)  Inpatient Treatment Team: Treatment Team: Attending Provider: Michael Boston, MD; Technician: Etheleen Sia, NT; Technician: Leda Quail, NT; Physical Therapy Assistant: Diego Cory, PTA; Case Manager: Guadalupe Maple, RN   Problem List:   Principal Problem:   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 Active Problems:   Pure hypercholesterolemia   Benign hypertensive heart disease without heart failure   Osteoarthritis   Myasthenia gravis ([place])   Spinal stenosis, lumbar region, with neurogenic claudication   Depression (emotion)   Insomnia   GERD (gastroesophageal reflux disease)   Mixed incontinence urge and stress   Essential hypertension, benign   Long term current use of systemic steroids   Squamous cell skin cancer   Right ovarian cyst s/p RSO   Hypokalemia   3 Days Post-Op  03/17/2018  POST-OPERATIVE DIAGNOSIS:    Cecal cancer Right ovarian cyst Skin mass with h/o skin cancer  PROCEDURE:    ROBOTIC PROXIMAL "RIGHT" COLECTOMY RIGHT SALPINGO-OOPHERECTOMY EXCISION OF SKIN MASS  SURGEON:  Adin Hector, MD, FACS, MASCRS      Assessment  Recovering okay  Plan:  -adv diet per ERAS protocol - solid diet -f/u path- pT3N0 -Myasthenia gravis - baseline steroid -bowel regimen - fiber to thicken up BMs -HTN -  restart home meds & follow -VTE prophylaxis- SCDs, etc -mobilize as tolerated to help recovery D/c home as tolerating diet and good pain control.  Discussed home instructions including IS and ambulating.     [facility] [phone] [address] [place], [place] [address] [phone] Main / Paging   03/20/2018    Subjective: (Chief complaint)   Feeling better, wants to go home.  Denies nausea.  "sore, but medications controlling."  Objective:  Vital signs:  Vitals:   03/19/18 0559 03/19/18 1332 03/19/18 2045 03/20/18 0535  BP: 136/74 (!) 109/58 135/66 (!) 163/92  Pulse: 70 79 67 62  Resp: '16 18 18 16  ' Temp: 99 F (37.2 C) 98 F (36.7 C) 99.1 F (37.3 C) 98.4 F (36.9 C)  TempSrc: Oral Oral Oral Oral  SpO2: 91% 93% 92% 93%  Weight:    67.8 kg (149 lb 7.6 oz)  Height:        Last BM Date: 03/18/18  Intake/Output   Yesterday:  04/18 0701 - 04/19 0700 In: 600 [P.O.:600] Out: 1500 [Urine:1500] This shift:  Total I/O In: 240 [P.O.:240] Out: 0   Bowel function:  Flatus: YES  BM:  YES - loose  Drain: (No drain)   Physical Exam:  A&O x 3 breathign comfortably abd soft, non distended, minimally tender.   Dressing c/d/i. No erythema or drainage from wound (visible through honeycomb)  Results:   Labs: Results for orders placed or performed during the hospital encounter of 03/17/18 (from the past 48 hour(s))  Creatinine, serum     Status: None   Collection Time: 03/20/18  5:07 AM  Result Value Ref Range   Creatinine, Ser 0.68 0.44 - 1.00 mg/dL   GFR calc non Af Amer >60 >60 mL/min   GFR calc Af Amer >60 >60 mL/min    Comment: (NOTE) The eGFR has been calculated using the CKD EPI equation. This calculation has not been validated in all clinical situations. eGFR's persistently <60 mL/min signify possible Chronic Kidney Disease. Performed at [facility], [place] [address] [place], [place] [address]    Potassium     Status: None   Collection Time: 03/20/18  5:07 AM  Result Value Ref Range   Potassium 3.5 3.5 - 5.1 mmol/L    Comment: Performed at [facility], [place] [address], [place], [place] [address]  Hemoglobin     Status: Abnormal   Collection Time: 03/20/18  5:07 AM  Result Value Ref Range   Hemoglobin 8.1 (L) 12.0 - 15.0 g/dL    Comment: Performed at [facility], [place] [address], [place], [place] [address]  Magnesium     Status: Abnormal   Collection Time: 03/20/18  5:07 AM  Result Value Ref Range   Magnesium 1.6 (L) 1.7 - 2.4 mg/dL    Comment: Performed at [facility], [place] [address] [place], [place] [address]    Imaging / Studies: No results found.  Medications / Allergies: per chart  Antibiotics: Anti-infectives (From admission, onward)   Start     Dose/Rate Route Frequency Ordered Stop   03/17/18 2200  cefoTEtan (CEFOTAN) 2 g in sodium chloride 0.9 % 100 mL IVPB     2 g 200 mL/hr over 30 Minutes Intravenous Every 12 hours 03/17/18 1753 03/17/18 2340   03/17/18 1600  clindamycin (CLEOCIN) 900 mg, gentamicin (GARAMYCIN) 240 mg in sodium chloride 0.9 % 1,000 mL for intraperitoneal lavage  Status:  Discontinued       As needed 03/17/18 1600 03/17/18 1647   03/17/18 1115  cefoTEtan (CEFOTAN) 2 g in sodium chloride 0.9 % 100 mL IVPB     2 g 200 mL/hr over 30 Minutes Intravenous On call to O.R. 03/17/18 1112 03/17/18 1425   03/17/18 1100  clindamycin (CLEOCIN) 900 mg, gentamicin (GARAMYCIN) 240 mg in sodium chloride 0.9 % 1,000 mL for intraperitoneal lavage  Status:  Discontinued     1 application Intraperitoneal To Surgery 03/17/18 1055 03/17/18 1056   03/17/18 1100  clindamycin (CLEOCIN) 900 mg, gentamicin (GARAMYCIN) 240 mg in sodium chloride 0.9 % 1,000 mL for intraperitoneal lavage      Intraperitoneal To Surgery 03/17/18 1056 03/18/18 1100   03/17/18 1055  cefoTEtan in Dextrose 5% (CEFOTAN) IVPB 2 g  Status:   Discontinued     2 g Intravenous On call to O.R. 03/17/18 1055 03/17/18 1112   03/17/18 1055  neomycin (MYCIFRADIN) tablet 1,000 mg  Status:  Discontinued     1,000 mg Oral 3 times per day 03/17/18 1055 03/17/18 1111   03/17/18 1055  metroNIDAZOLE (FLAGYL) tablet 1,000 mg  Status:  Discontinued     1,000 mg Oral 3 times per day 03/17/18 1055 03/17/18 1111        Note: Portions of this report may have been transcribed using voice recognition software. Every effort was made to ensure accuracy; however, inadvertent computerized transcription errors may be present.   Any transcriptional errors that result from this process are unintentional.      [facility] [address] [place], [place] [address] [phone] Main /  Paging   03/20/2018

## 2018-03-20 NOTE — Care Management Important Message (Signed)
Important Message  Patient Details  Name: Tamara Gregory MRN: [id number] Date of Birth: 10/01/1925   Medicare Important Message Given:  Yes    Kerin Salen 03/20/2018, 11:16 AMImportant Message  Patient Details  Name: Tamara Gregory MRN: [id number] Date of Birth: Nov 28, 1925   Medicare Important Message Given:  Yes    Kerin Salen 03/20/2018, 11:13 AM

## 2018-03-20 NOTE — Progress Notes (Signed)
Physical Therapy Treatment Patient Details Name: Tamara Gregory MRN: [id number] DOB: [birth year] Today's Date: 03/20/2018    History of Present Illness 84 yo female s/p R colectomy, R salpingo-oophorectomy 03/17/18. Hx of myasthenia gravis, colorectal cancer.    PT Comments    Pt feeling better and plans to D/C back home with family.  Assisted with amb a greater distance in hallway.  Tolerated well.    Follow Up Recommendations        Equipment Recommendations       Recommendations for Other Services       Precautions / Restrictions Precautions Precautions: Fall Restrictions Weight Bearing Restrictions: No    Mobility  Bed Mobility               General bed mobility comments: OOB in recliner  Transfers Overall transfer level: Needs assistance Equipment used: Rolling walker (2 wheeled) Transfers: Sit to/from Stand;Stand Pivot Transfers Sit to Stand: Supervision;Min guard Stand pivot transfers: Supervision;Min guard       General transfer comment: safety  Ambulation/Gait Ambulation/Gait assistance: Min guard;Supervision Ambulation Distance (Feet): 75 Feet Assistive device: Rolling walker (2 wheeled) Gait Pattern/deviations: Step-to pattern;Step-through pattern     General Gait Details: safety   Stairs             Wheelchair Mobility    Modified Rankin (Stroke Patients Only)       Balance                                            Cognition Arousal/Alertness: Awake/alert Behavior During Therapy: WFL for tasks assessed/performed Overall Cognitive Status: Within Functional Limits for tasks assessed                                        Exercises      General Comments        Pertinent Vitals/Pain Pain Assessment: No/denies pain    Home Living                      Prior Function            PT Goals (current goals can now be found in the care plan section) Progress towards PT  goals: Goals met and updated - see care plan    Frequency    Min 3X/week      PT Plan Current plan remains appropriate    Co-evaluation              AM-PAC PT "6 Clicks" Daily Activity  Outcome Measure  Difficulty turning over in bed (including adjusting bedclothes, sheets and blankets)?: A Little Difficulty moving from lying on back to sitting on the side of the bed? : A Little Difficulty sitting down on and standing up from a chair with arms (e.g., wheelchair, bedside commode, etc,.)?: A Little Help needed moving to and from a bed to chair (including a wheelchair)?: A Little Help needed walking in hospital room?: A Little Help needed climbing 3-5 steps with a railing? : A Little 6 Click Score: 18    End of Session Equipment Utilized During Treatment: Gait belt Activity Tolerance: Patient limited by pain Patient left: in chair;with chair alarm set;with family/visitor present;with call bell/phone within reach   PT Visit Diagnosis: Muscle weakness (generalized) (  M62.81);Difficulty in walking, not elsewhere classified (R26.2);Pain     Time: 9381-8299 PT Time Calculation (min) (ACUTE ONLY): 13 min  Charges:  $Gait Training: 8-22 mins                    G Codes:       Rica Koyanagi  PTA [facility]  Acute  Rehab Pager      [phone]

## 2018-03-21 NOTE — Discharge Summary (Signed)
Physician Discharge Summary  Patient ID: Tamara Gregory MRN: [id number] DOB/AGE: 1925/08/01 84 y.o.  Admit date: 03/17/2018 Discharge date: 03/21/2018  Admission Diagnoses: Patient Active Problem List   Diagnosis Date Noted  . Hypokalemia 03/18/2018  . 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 Right ovarian cyst s/p RSO 03/17/2018  . Diarrhea 12/23/2017  . Large hiatal hernia 04/01/2017  . Sweating profusely 03/19/2017  . Hyperglycemia 03/19/2017  . Squamous cell skin cancer 11/05/2016  . Nosebleed 10/19/2016  . Osteopenia 08/28/2016  . Essential hypertension, benign 08/21/2016  . Long term current use of systemic steroids 08/21/2016  . GERD (gastroesophageal reflux disease) 05/06/2016  . Mixed incontinence urge and stress 05/06/2016  . Low hemoglobin 09/22/2014  . Insomnia 07/26/2014  . Malaise and fatigue 10/04/2013  . PAC (premature atrial contraction) 10/04/2013  . Depression (emotion) 02/15/2013  . Spinal stenosis, lumbar region, with neurogenic claudication 02/04/2013  . Myasthenia gravis ([place]) 03/17/2012  . Pure hypercholesterolemia 09/05/2011  . Benign hypertensive heart disease without heart failure 09/05/2011  . Osteoarthritis 09/05/2011    Discharge Diagnoses:  Principal Problem:   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 Active Problems:   Pure hypercholesterolemia   Benign hypertensive heart disease without heart failure   Osteoarthritis   Myasthenia gravis ([place])   Spinal stenosis, lumbar region, with neurogenic claudication   Depression (emotion)   Insomnia   GERD (gastroesophageal reflux disease)   Mixed incontinence urge and stress   Essential hypertension, benign   Long term current use of systemic steroids   Squamous cell skin cancer   Right ovarian cyst s/p RSO   Hypokalemia   Discharged Condition: stable  Hospital Course:  Pt is a lovely 84 yo F who underwent robotic right colectomy with right  salpingo-oophorectomy for cecal cancer.  She was admitted to the surgical floor.  She did well post op with advancing diet per ERAS protocol.  She had foley removed and was able to urinate independently.  She was mobile.  She required steroids for her myasthenia gravis.  She required minimal pain medication.  Her wound had no evidence of infection.  She was able to pass gas and have BM, tolerated diet.  She was discharged to home in stable condition.    Consults: None  Significant Diagnostic Studies: labs: See epic for full details.  Prior to d/c, K 3.5, Cr 0.68, Hgb 8.1 and stable (10 is baseline), WBCs 12.8  Treatments: surgery: see above.  Discharge Exam: Blood pressure (!) 163/92, pulse 62, temperature 98.4 F (36.9 C), temperature source Oral, resp. rate 16, height 5\' 2"  (1.575 m), weight 67.8 kg (149 lb 7.6 oz), SpO2 93 %. General appearance: alert and no distress Resp: breathing comfortably.  Up in chair, dressed GI: soft, non distended, wounds c/d/i.  no erythema.    Disposition:   Discharge Instructions    Call MD for:  difficulty breathing, headache or visual disturbances   Complete by:  As directed    Call MD for:  persistant nausea and vomiting   Complete by:  As directed    Call MD for:  redness, tenderness, or signs of infection (pain, swelling, redness, odor or green/yellow discharge around incision site)   Complete by:  As directed    Call MD for:  severe uncontrolled pain   Complete by:  As directed    Call MD for:  temperature >100.4   Complete by:  As directed    Diet - low  sodium heart healthy   Complete by:  As directed    Increase activity slowly   Complete by:  As directed      Allergies as of 03/20/2018   No Known Allergies     Medication List    STOP taking these medications   aspirin EC 81 MG tablet Replaced by:  aspirin 81 MG chewable tablet   ferrous sulfate 325 (65 FE) MG tablet     TAKE these medications   acetaminophen 500 MG  tablet Commonly known as:  TYLENOL Take 2 tablets (1,000 mg total) by mouth 3 (three) times daily. What changed:    when to take this  reasons to take this   aspirin 81 MG chewable tablet Chew 0.5 tablets (40.5 mg total) by mouth daily. Replaces:  aspirin EC 81 MG tablet   CALCIUM 500 PO Take 500 mg by mouth daily.   Fish Oil 435 MG Caps Take 435 mg by mouth daily.   FLONASE 50 MCG/ACT nasal spray Generic drug:  fluticasone Place 1 spray into both nostrils at bedtime.   gabapentin 300 MG capsule Commonly known as:  NEURONTIN Take 1 capsule (300 mg total) by mouth 2 (two) times daily.   GLUCOSAMINE 1500 COMPLEX PO Take 1 tablet by mouth daily.   latanoprost 0.005 % ophthalmic solution Commonly known as:  XALATAN Place 1 drop into the left eye at bedtime.   losartan-hydrochlorothiazide 50-12.5 MG tablet Commonly known as:  HYZAAR Take 1 tablet by mouth daily.   omeprazole 40 MG capsule Commonly known as:  PRILOSEC Take 40 mg by mouth daily.   predniSONE 5 MG tablet Commonly known as:  DELTASONE Take 2 tablets (10 mg total) by mouth daily with breakfast.   PRESERVISION AREDS Caps Take 1 capsule by mouth 2 (two) times daily.   pyridostigmine 60 MG tablet Commonly known as:  MESTINON Take half tablet 9am and 2pm   rosuvastatin 5 MG tablet Commonly known as:  CRESTOR TAKE 1 TABLET EVERY DAY What changed:    how much to take  how to take this  when to take this   sertraline 50 MG tablet Commonly known as:  ZOLOFT Take 1 tablet (50 mg total) by mouth at bedtime.   traMADol 50 MG tablet Commonly known as:  ULTRAM Take 1-2 tablets (50-100 mg total) by mouth every 6 (six) hours as needed for moderate pain or severe pain.   traZODone 50 MG tablet Commonly known as:  DESYREL TAKE 1/2 TO 1 TABLET (25-50 MG TOTAL) BY MOUTH AT BEDTIME AS NEEDED FOR SLEEP. What changed:  See the new instructions.   Vitamin D 2000 units tablet Take 2,000 Units by mouth  daily.      Follow-up Information    Michael Boston, MD. Schedule an appointment as soon as possible for a visit in 2 weeks.   Specialty:  General Surgery Why:  To follow up after your operation, To follow up after your hospital stay Contact information: [place] [address] [phone]           Signed: Stark Klein 03/21/2018, 10:31 AM

## 2018-03-24 ENCOUNTER — Telehealth: Payer: Self-pay | Admitting: Emergency Medicine

## 2018-03-24 NOTE — Telephone Encounter (Signed)
Called patient to schedule AWV. Spoke to patients husband and she declines at this time.

## 2018-04-01 ENCOUNTER — Other Ambulatory Visit: Payer: Self-pay | Admitting: Internal Medicine

## 2018-04-01 DIAGNOSIS — H5213 Myopia, bilateral: Secondary | ICD-10-CM | POA: Diagnosis not present

## 2018-04-01 DIAGNOSIS — H524 Presbyopia: Secondary | ICD-10-CM | POA: Diagnosis not present

## 2018-04-01 DIAGNOSIS — H401121 Primary open-angle glaucoma, left eye, mild stage: Secondary | ICD-10-CM | POA: Diagnosis not present

## 2018-04-01 DIAGNOSIS — H52203 Unspecified astigmatism, bilateral: Secondary | ICD-10-CM | POA: Diagnosis not present

## 2018-04-20 DIAGNOSIS — C44722 Squamous cell carcinoma of skin of right lower limb, including hip: Secondary | ICD-10-CM | POA: Diagnosis not present

## 2018-04-20 DIAGNOSIS — D485 Neoplasm of uncertain behavior of skin: Secondary | ICD-10-CM | POA: Diagnosis not present

## 2018-04-21 DIAGNOSIS — Z961 Presence of intraocular lens: Secondary | ICD-10-CM | POA: Diagnosis not present

## 2018-04-21 DIAGNOSIS — H353131 Nonexudative age-related macular degeneration, bilateral, early dry stage: Secondary | ICD-10-CM | POA: Diagnosis not present

## 2018-04-21 DIAGNOSIS — H35373 Puckering of macula, bilateral: Secondary | ICD-10-CM | POA: Diagnosis not present

## 2018-04-21 DIAGNOSIS — H401131 Primary open-angle glaucoma, bilateral, mild stage: Secondary | ICD-10-CM | POA: Diagnosis not present

## 2018-04-22 DIAGNOSIS — C44722 Squamous cell carcinoma of skin of right lower limb, including hip: Secondary | ICD-10-CM | POA: Diagnosis not present

## 2018-05-01 ENCOUNTER — Other Ambulatory Visit: Payer: Self-pay | Admitting: Neurology

## 2018-05-07 DIAGNOSIS — Z85828 Personal history of other malignant neoplasm of skin: Secondary | ICD-10-CM | POA: Diagnosis not present

## 2018-05-07 DIAGNOSIS — C44722 Squamous cell carcinoma of skin of right lower limb, including hip: Secondary | ICD-10-CM | POA: Diagnosis not present

## 2018-05-13 ENCOUNTER — Encounter: Payer: Self-pay | Admitting: Internal Medicine

## 2018-05-21 ENCOUNTER — Ambulatory Visit: Payer: Medicare Other | Admitting: Internal Medicine

## 2018-06-03 ENCOUNTER — Other Ambulatory Visit: Payer: Self-pay | Admitting: Internal Medicine

## 2018-06-09 DIAGNOSIS — S91301A Unspecified open wound, right foot, initial encounter: Secondary | ICD-10-CM | POA: Diagnosis not present

## 2018-06-10 DIAGNOSIS — H401121 Primary open-angle glaucoma, left eye, mild stage: Secondary | ICD-10-CM | POA: Diagnosis not present

## 2018-06-10 DIAGNOSIS — Z961 Presence of intraocular lens: Secondary | ICD-10-CM | POA: Diagnosis not present

## 2018-06-17 ENCOUNTER — Other Ambulatory Visit: Payer: Self-pay | Admitting: Internal Medicine

## 2018-06-26 ENCOUNTER — Telehealth: Payer: Self-pay | Admitting: Internal Medicine

## 2018-06-26 NOTE — Telephone Encounter (Signed)
Copied from [place] [phone]. Topic: Quick Communication - Rx Refill/Question >> Jun 26, 2018  1:58 PM Selinda Flavin B, NT wrote: **Patient will be out of town for 3 weeks. States that an appointment will be made when she returns home. Would like to know if medication could be sent.**   Medication: DULoxetine (CYMBALTA) 60 MG capsule [Pharmacy Med Name: DULOXETINE HCL 60 MG Capsule Delayed Release Particles]   Has the patient contacted their pharmacy? Yes.   (Agent: If no, request that the patient contact the pharmacy for the refill.) (Agent: If yes, when and what did the pharmacy advise?)  Preferred Pharmacy (with phone number or street name): [facility] [place], [place]: Please be advised that RX refills may take up to 3 business days. We ask that you follow-up with your pharmacy.

## 2018-06-29 NOTE — Telephone Encounter (Signed)
Called pt/husband no answer LMOM w/MD response. Sent CRM to [facility] for FYI.Marland KitchenJohny Chess

## 2018-06-29 NOTE — Telephone Encounter (Signed)
Med is not on med list pls advise../lmb 

## 2018-06-29 NOTE — Telephone Encounter (Signed)
We change cymbalta to sertraline in January -- she may need a refill of sertraline - ok to send in but confirm with her she is taking the sertraline

## 2018-06-30 MED ORDER — SERTRALINE HCL 50 MG PO TABS: 50.0000 mg | ORAL_TABLET | Freq: Every day | ORAL | 1 refills | Status: DC

## 2018-06-30 NOTE — Addendum Note (Signed)
Addended by: Earnstine Regal on: 06/30/2018 10:59 AM   Modules accepted: Orders

## 2018-06-30 NOTE — Telephone Encounter (Signed)
Patient husband was confused. But they do need a reefill of the Sertraline. He thought the other name for Sertraline was Duloxetine. He now understand and they would like the Sertraline sent to [facility] order.

## 2018-06-30 NOTE — Telephone Encounter (Signed)
Sent rx for sertraline to [facility].Marland KitchenJohny Gregory

## 2018-07-01 ENCOUNTER — Other Ambulatory Visit: Payer: Self-pay | Admitting: Internal Medicine

## 2018-07-16 ENCOUNTER — Telehealth: Payer: Self-pay | Admitting: Neurology

## 2018-08-02 ENCOUNTER — Other Ambulatory Visit: Payer: Self-pay | Admitting: Neurology

## 2018-08-12 ENCOUNTER — Telehealth: Payer: Self-pay | Admitting: Neurology

## 2018-08-12 NOTE — Telephone Encounter (Signed)
Mr. Tamara Gregory said that patient's eyelid is really drooping and bothering her.  Can we increase one of her meds?

## 2018-08-12 NOTE — Telephone Encounter (Signed)
Patient's husband called and is needing to know if you could please call him back he has some questions regarding his wife. Thanks

## 2018-08-13 NOTE — Telephone Encounter (Signed)
Please find out the current dose of her medications - how much prednisone and mestinon?  Ideally, I would like to see her in the office to determine medication changes, there is an opening today and tomorrow.   Mikka Kissner K. Posey Pronto, DO

## 2018-08-13 NOTE — Telephone Encounter (Signed)
I called Mr. Juliana back and informed him that Dr. Posey Pronto would like to see patient but he said that she is out of town.  Instructed him to have her increase the mestinon to 60 mg bid (1 whole tablet bid) and to have patient follow up with [place] on September 24 at 3:30.

## 2018-08-20 DIAGNOSIS — S91301A Unspecified open wound, right foot, initial encounter: Secondary | ICD-10-CM | POA: Diagnosis not present

## 2018-08-24 DIAGNOSIS — G8929 Other chronic pain: Secondary | ICD-10-CM | POA: Diagnosis not present

## 2018-08-24 DIAGNOSIS — M545 Low back pain: Secondary | ICD-10-CM | POA: Diagnosis not present

## 2018-08-25 ENCOUNTER — Encounter: Payer: Self-pay | Admitting: Neurology

## 2018-08-25 ENCOUNTER — Ambulatory Visit (INDEPENDENT_AMBULATORY_CARE_PROVIDER_SITE_OTHER): Payer: Medicare Other | Admitting: Neurology

## 2018-08-25 VITALS — BP 138/76 | HR 74 | Ht 64.0 in | Wt 143.0 lb

## 2018-08-25 DIAGNOSIS — G7000 Myasthenia gravis without (acute) exacerbation: Secondary | ICD-10-CM | POA: Diagnosis not present

## 2018-08-25 MED ORDER — PYRIDOSTIGMINE BROMIDE 60 MG PO TABS: 60.0000 mg | ORAL_TABLET | Freq: Two times a day (BID) | ORAL | 3 refills | Status: DC

## 2018-08-25 MED ORDER — PREDNISONE 10 MG PO TABS: 20.0000 mg | ORAL_TABLET | Freq: Every day | ORAL | 3 refills | Status: DC

## 2018-08-25 NOTE — Progress Notes (Signed)
Follow-up Visit   Date: 08/25/18    Tamara Gregory MRN: [id number] DOB: [birth year]   Interim History: Tamara Gregory is a 84 y.o. Caucasian female with hypertension, depression, GERD, hyperlipidemia, chronic low back pain s/p spinal cord stimulator, and newly diagnosed colon cancer returning to the clinic for follow-up of ocular myasthenia.  The patient was accompanied to the clinic by husband who also provides collateral information.    History of present illness: She was diagnosed with myasthenia in 2013 by Dr. Vallarie Mare at [facility] by [facility] antibody testing. CT chest was negative.  Symptoms manifested with R > L ptosis.  She did not have any double vision, difficulty swallowing/talking, shortness of breath, or limb weakness.  She did not benefit with mestinon and discontinued this due to symptoms of malaise.  Prednisone 20mg  was started and slowly tapered to 5mg  in December 2014.  She has been on this dose for the past 3 years and reports having no exacerbations with MG.  She continues to have mild ptosis bilaterally, but it does not bother her.  Again, she denies double vision.   In late 2018, I offered to taper her prednisone and she did well on 4mg  daily, but started to have worsening ptosis with 3mg .  UPDATE 03/06/2018:  She is here for follow-up visit. She has been on prednisone 5mg  for 3 weeks and has not appreciated any improvement in the droopiness of her right eye. She denies any double vision, difficulty swallowing or talking.  She has been struggling with diarrhea for 3 months and yesterday has colonoscopy which showed malignant colon mass.  She is undergoing further imaging today.   UPDATE 08/25/2018:   She is here for follow-up visit.  Since her last visit in April, she underwent right robotic colectomy with a right salpingo-oophorectomy for cecal cancer and recovered well.  She has been on prednisone 10 mg daily and has noticed intermittent worsening droopiness of the right  eyelid, which has been getting worse and will completely close sometimes.  Her husband called last week because of this and was recommended to increase mestinon to 60mg  twice daily (8am, 2:30pm, previously taking 30mg  twice daily).  Unfortunately, there has not been any improvement with this.  She denies any double vision, difficulty swallowing/talking, or generalized weakness. She does have significant low back pain and had ESI yesterday, which limits her walking.  She is using a walker.  She is [place] to [place] today and will be back for Christmas.   Medications:  Current Outpatient Medications on File Prior to Visit  Medication Sig Dispense Refill  . acetaminophen (TYLENOL) 500 MG tablet Take 2 tablets (1,000 mg total) by mouth 3 (three) times daily. 30 tablet 0  . aspirin 81 MG chewable tablet Chew 0.5 tablets (40.5 mg total) by mouth daily. 30 tablet 0  . Calcium Carbonate (CALCIUM 500 PO) Take 500 mg by mouth daily.     . Cholecalciferol (VITAMIN D) 2000 units tablet Take 2,000 Units by mouth daily.    . fluticasone (FLONASE) 50 MCG/ACT nasal spray Place 1 spray into both nostrils at bedtime.    . Glucosamine-Chondroit-Vit C-Mn (GLUCOSAMINE 1500 COMPLEX PO) Take 1 tablet by mouth daily.     [name] latanoprost (XALATAN) 0.005 % ophthalmic solution Place 1 drop into the left eye at bedtime.  1  . losartan-hydrochlorothiazide (HYZAAR) 50-12.5 MG tablet Take 1 tablet by mouth daily. 90 tablet 3  . Multiple Vitamins-Minerals (PRESERVISION AREDS) CAPS  Take 1 capsule by mouth 2 (two) times daily.     . Omega-3 Fatty Acids (FISH OIL) 435 MG CAPS Take 435 mg by mouth daily.    [name] omeprazole (PRILOSEC) 40 MG capsule Take 40 mg by mouth daily.     . predniSONE (DELTASONE) 5 MG tablet Take 2 tablets (10 mg total) by mouth daily with breakfast. 180 tablet 3  . rosuvastatin (CRESTOR) 5 MG tablet TAKE 1 TABLET EVERY DAY IN THE EVENING 90 tablet 1  . sertraline (ZOLOFT) 50 MG tablet Take 1 tablet  (50 mg total) by mouth at bedtime. 90 tablet 1  . traZODone (DESYREL) 50 MG tablet Take 1/2 to 1 tablet at bedtime as needed for sleep -- Office visit needed for further refills 90 tablet 0   No current facility-administered medications on file prior to visit.     Allergies: No Known Allergies  Review of Systems:  CONSTITUTIONAL: No fevers, chills, night sweats, +weight loss.  EYES: +visual changes or eye pain ENT: No hearing changes.  No history of nose bleeds.   RESPIRATORY: No cough, wheezing and shortness of breath.   CARDIOVASCULAR: Negative for chest pain, and palpitations.   GI: Negative for abdominal discomfort, blood in stools or black stools.   GU:  No history of incontinence.   MUSCLOSKELETAL: +history of joint pain or swelling.  No myalgias.   SKIN: Negative for lesions, rash, and itching.   ENDOCRINE: Negative for cold or heat intolerance, polydipsia or goiter.   PSYCH:  No depression or anxiety symptoms.   NEURO: As Above.   Vital Signs:  BP 138/76   Pulse 74   Ht 5\' 4"  (1.626 m)   Wt 143 lb (64.9 kg)   SpO2 92%   BMI 24.55 kg/m   General Medical Exam:   General:  Well appearing, comfortable  Eyes/ENT: see cranial nerve examination.   Neck: No masses appreciated.  Full range of motion without tenderness.  No carotid bruits. Respiratory:  Clear to auscultation, good air entry bilaterally.   Cardiac:  Regular rate and rhythm, harsh systolic murmur.   Ext:  Multiple ecchymosis, thin skin  Neurological Exam: MENTAL STATUS including orientation to time, place, person, recent and remote memory, attention span and concentration, language, and fund of knowledge is normal.  Speech is not dysarthric.  CRANIAL NERVES:   Pupils equal round and reactive to light.  Normal conjugate, extra-ocular eye movements in all directions of gaze.  Moderate right ptosis with upper eye lid up covering 50% R pupil, very mild ptosis on the left. No worsening with sustained upgaze. Facial  muscles are 5/5.   Face is symmetric. Palate elevates symmetrically.  Tongue is midline.  MOTOR:  Motor strength is 5/5 in all extremities, no fatigability.  No pronator drift.  Tone is normal.    COORDINATION/GAIT:  Stooped posture, unsteady gait, assisted with rollator  Data: Labs 01/2012:  + ACh R antibody at .81 (low range of + >.3)  IMPRESSION/PLAN: Seropositive ocular myasthenia gravis with exacerbation, diagnosed 2013 at [facility] by Dr. Vallarie Mare.  She was initially treated with prednisone 20 mg daily and over the years was tapered down to prednisone 5 mg.  In February 2019, she was down to prednisone 3 mg, however developed worsening right ptosis so restarted at 5 mg daily.   With her worsening ptosis which obstructing vision, recommend increasing prednisone to 20 mg daily. Continue Mestinon 60 mg twice daily at 8 AM and 3 PM.  Long-term corticosteroid  use.  Start Pepcid and calcium with vitamin D supplements.   She was encouraged to talk to her PCP about getting DEXA scan   Return to clinic in 3 months, or sooner as needed   Thank you for allowing me to participate in patient's care.  If I can answer any additional questions, I would be pleased to do so.    Sincerely,    Piotr Christopher K. Posey Pronto, DO

## 2018-08-25 NOTE — Patient Instructions (Addendum)
Increase prednisone to 20mg  daily  Continue mestinon 60mg  twice daily  Return to clinic in December 19th at [facility]

## 2018-09-12 ENCOUNTER — Other Ambulatory Visit: Payer: Self-pay | Admitting: Internal Medicine

## 2018-09-15 DIAGNOSIS — I10 Essential (primary) hypertension: Secondary | ICD-10-CM | POA: Diagnosis not present

## 2018-09-15 DIAGNOSIS — G8929 Other chronic pain: Secondary | ICD-10-CM | POA: Diagnosis not present

## 2018-09-15 DIAGNOSIS — M545 Low back pain: Secondary | ICD-10-CM | POA: Diagnosis not present

## 2018-09-16 DIAGNOSIS — H401121 Primary open-angle glaucoma, left eye, mild stage: Secondary | ICD-10-CM | POA: Diagnosis not present

## 2018-09-29 DIAGNOSIS — Z23 Encounter for immunization: Secondary | ICD-10-CM | POA: Diagnosis not present

## 2018-11-02 DIAGNOSIS — M47816 Spondylosis without myelopathy or radiculopathy, lumbar region: Secondary | ICD-10-CM | POA: Diagnosis not present

## 2018-11-18 NOTE — Progress Notes (Signed)
Subjective:    Patient ID: Tamara Gregory, female    DOB: 05-23-1925, 84 y.o.   MRN: [id number]  HPI The patient is here for follow up.  Depression: She is taking her medication daily as prescribed. She denies any side effects from the medication. She feels her depression is not well controlled.  She feels very depressed.  She has chronic back pain and is severely limited physically.  Hypertension: She is taking her medication daily. She is compliant with a low sodium diet.  She denies palpitations, edema, and regular headaches. She is not exercising regularly.  She does not monitor her blood pressure at home.    Chronic back pain:  She is going to a series of injection just after Christmas.  She also has a back stimulator that is not currently being used and they may try this again after the injections if she is still in pain.  GERD:  She is taking her medication daily as prescribed.  She denies any GERD symptoms and feels her GERD is well controlled.   Sleep difficulties:  Her sleep is controlled with her current medication.   Medications and allergies reviewed with patient and updated if appropriate.  Patient Active Problem List   Diagnosis Date Noted  . Hypokalemia 03/18/2018  . 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 Right ovarian cyst s/p RSO 03/17/2018  . Diarrhea 12/23/2017  . Large hiatal hernia 04/01/2017  . Sweating profusely 03/19/2017  . Hyperglycemia 03/19/2017  . Squamous cell skin cancer 11/05/2016  . Nosebleed 10/19/2016  . Osteopenia 08/28/2016  . Essential hypertension, benign 08/21/2016  . Long term current use of systemic steroids 08/21/2016  . GERD (gastroesophageal reflux disease) 05/06/2016  . Mixed incontinence urge and stress 05/06/2016  . Low hemoglobin 09/22/2014  . Insomnia 07/26/2014  . Malaise and fatigue 10/04/2013  . PAC (premature atrial contraction) 10/04/2013  . Depression 02/15/2013  . Spinal stenosis, lumbar  region, with neurogenic claudication 02/04/2013  . Myasthenia gravis ([place]) 03/17/2012  . Pure hypercholesterolemia 09/05/2011  . Benign hypertensive heart disease without heart failure 09/05/2011  . Osteoarthritis 09/05/2011    Current Outpatient Medications on File Prior to Visit  Medication Sig Dispense Refill  . acetaminophen (TYLENOL) 500 MG tablet Take 2 tablets (1,000 mg total) by mouth 3 (three) times daily. 30 tablet 0  . aspirin 81 MG chewable tablet Chew 0.5 tablets (40.5 mg total) by mouth daily. 30 tablet 0  . Calcium Carbonate (CALCIUM 500 PO) Take 500 mg by mouth daily.     . Cholecalciferol (VITAMIN D) 2000 units tablet Take 2,000 Units by mouth daily.    . fluticasone (FLONASE) 50 MCG/ACT nasal spray Place 1 spray into both nostrils at bedtime.    . Glucosamine-Chondroit-Vit C-Mn (GLUCOSAMINE 1500 COMPLEX PO) Take 1 tablet by mouth daily.     [name] latanoprost (XALATAN) 0.005 % ophthalmic solution Place 1 drop into the left eye at bedtime.  1  . losartan-hydrochlorothiazide (HYZAAR) 50-12.5 MG tablet Take 1 tablet by mouth daily. 90 tablet 3  . Multiple Vitamins-Minerals (PRESERVISION AREDS) CAPS Take 1 capsule by mouth 2 (two) times daily.     . Omega-3 Fatty Acids (FISH OIL) 435 MG CAPS Take 435 mg by mouth daily.    [name] omeprazole (PRILOSEC) 40 MG capsule Take 40 mg by mouth daily.     . predniSONE (DELTASONE) 10 MG tablet Take 2 tablets (20 mg total) by mouth daily with breakfast. 180 tablet  3  . predniSONE (DELTASONE) 5 MG tablet Take 2 tablets (10 mg total) by mouth daily with breakfast. 180 tablet 3  . pyridostigmine (MESTINON) 60 MG tablet Take 1 tablet (60 mg total) by mouth 2 (two) times daily. 180 tablet 3  . ranitidine (ZANTAC) 150 MG tablet Take 1 tablet (150 mg total) by mouth at bedtime. Annual appt due in Dec must see provider for future refills 90 tablet 0  . rosuvastatin (CRESTOR) 5 MG tablet TAKE 1 TABLET EVERY DAY IN THE EVENING 90 tablet 1  . sertraline  (ZOLOFT) 50 MG tablet Take 1 tablet (50 mg total) by mouth at bedtime. 90 tablet 1  . traZODone (DESYREL) 50 MG tablet Take 1/2 to 1 tablet at bedtime as needed for sleep -- Office visit needed for further refills 90 tablet 0   No current facility-administered medications on file prior to visit.     Past Medical History:  Diagnosis Date  . Anemia   . Anxiety   . Arthritis   . Cancer (HCC)    hx of skin cancer , colon   . Cataract    removed both eyes   . Chronic back pain    okay with sitting, increased back pain with activity   . Depression   . GERD (gastroesophageal reflux disease)   . Glaucoma   . Headache(784.0)    hx of  . History of kidney stones   . History of migraines   . Hyperlipidemia   . Hypertension   . Macular degeneration   . Myasthenia gravis ([place])   . Occasional tremors   . Pneumonia   . Spinal stenosis   . Thoracic aneurysm    4.4cm; see CT done 01/28/12 and 01/22/13 in [facility].   [name] Uses hearing aid   . Vitamin D deficiency     Past Surgical History:  Procedure Laterality Date  . ABDOMINAL HYSTERECTOMY    . CHOLECYSTECTOMY    . COLON SURGERY     Right colectomy dr. gross 03-17-18  . LUMBAR LAMINECTOMY/DECOMPRESSION MICRODISCECTOMY N/A 02/04/2013   Procedure: CENTRAL DECOMPRESSION/LUMBAR LAMINECTOMY L3-L4 AND L4-L5 2 LEVELS;  Surgeon: Tobi Bastos, MD;  Location: [facility];  Service: Orthopedics;  Laterality: N/A;  . SPINAL CORD STIMULATOR IMPLANT     in right hip but not currently using because it did not help  . TONSILLECTOMY    . UPPER GASTROINTESTINAL ENDOSCOPY    . US ECHOCARDIOGRAPHY  05/18/2007   EF 55-60%    Social History   Socioeconomic History  . Marital status: Married    Spouse name: Not on file  . Number of children: Not on file  . Years of education: Not on file  . Highest education level: Not on file  Occupational History  . Not on file  Social Needs  . Financial resource strain: Not on file  . Food insecurity:    Worry: Not  on file    Inability: Not on file  . Transportation needs:    Medical: Not on file    Non-medical: Not on file  Tobacco Use  . Smoking status: Never Smoker  . Smokeless tobacco: Never Used  Substance and Sexual Activity  . Alcohol use: No  . Drug use: No  . Sexual activity: Not Currently  Lifestyle  . Physical activity:    Days per week: Not on file    Minutes per session: Not on file  . Stress: Not on file  Relationships  . Social connections:  Talks on phone: Not on file    Gets together: Not on file    Attends religious service: Not on file    Active member of club or organization: Not on file    Attends meetings of clubs or organizations: Not on file    Relationship status: Not on file  Other Topics Concern  . Not on file  Social History Narrative   Lives with husband in a one story home.  Has 2 sons.  Retired.  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  Education: college.  [employer].    Family History  Problem Relation Age of Onset  . Heart attack Father   . Diabetes Father   . Cancer Sister   . Breast cancer Sister   . Cancer Brother   . Lung cancer Brother   . Cancer Sister   . Liver cancer Sister   . Diabetes Sister   . Colon polyps Neg Hx   . Colon cancer Neg Hx   . Esophageal cancer Neg Hx   . Rectal cancer Neg Hx   . Stomach cancer Neg Hx   . Adrenal disorder Neg Hx     Review of Systems  Constitutional: Positive for diaphoresis. Negative for chills and fever.  Respiratory: Positive for shortness of breath (chronic). Negative for cough and wheezing.   Cardiovascular: Positive for chest pain. Negative for palpitations and leg swelling.  Neurological: Positive for dizziness (occ) and light-headedness (occ). Negative for headaches.  Psychiatric/Behavioral: Positive for dysphoric mood and sleep disturbance (controlled).       Objective:   Vitals:   11/19/18 0806  BP: (!) 146/84  Pulse: 61  Resp: 16  Temp: 97.9 F (36.6  C)  SpO2: 99%   BP Readings from Last 3 Encounters:  11/19/18 (!) 146/84  08/25/18 138/76  03/20/18 (!) 163/92   Wt Readings from Last 3 Encounters:  11/19/18 145 lb (65.8 kg)  08/25/18 143 lb (64.9 kg)  03/20/18 149 lb 7.6 oz (67.8 kg)   Body mass index is 24.89 kg/m.   Physical Exam    Constitutional: Appears well-developed and well-nourished. No distress.  HENT:  Head: Normocephalic and atraumatic.  Neck: Neck supple. No tracheal deviation present. No thyromegaly present.  No cervical lymphadenopathy Cardiovascular: Normal rate, regular rhythm and normal heart sounds.   2/6 systolic murmur heard. No carotid bruit .  No edema Pulmonary/Chest: Effort normal and breath sounds normal. No respiratory distress. No has no wheezes. No rales.  Skin: Skin is warm and dry. Not diaphoretic.  Psychiatric: Depressed mood and affect. Behavior is normal.      Assessment & Plan:    See Problem List for Assessment and Plan of chronic medical problems.

## 2018-11-19 ENCOUNTER — Encounter: Payer: Self-pay | Admitting: Internal Medicine

## 2018-11-19 ENCOUNTER — Encounter: Payer: Self-pay | Admitting: Neurology

## 2018-11-19 ENCOUNTER — Ambulatory Visit (INDEPENDENT_AMBULATORY_CARE_PROVIDER_SITE_OTHER): Payer: Medicare Other | Admitting: Neurology

## 2018-11-19 ENCOUNTER — Ambulatory Visit (INDEPENDENT_AMBULATORY_CARE_PROVIDER_SITE_OTHER): Payer: Medicare Other | Admitting: Internal Medicine

## 2018-11-19 VITALS — BP 146/84 | HR 61 | Temp 97.9°F | Resp 16 | Ht 64.0 in | Wt 145.0 lb

## 2018-11-19 VITALS — BP 130/60 | HR 96 | Ht 60.0 in | Wt 147.0 lb

## 2018-11-19 DIAGNOSIS — G4700 Insomnia, unspecified: Secondary | ICD-10-CM | POA: Diagnosis not present

## 2018-11-19 DIAGNOSIS — G7000 Myasthenia gravis without (acute) exacerbation: Secondary | ICD-10-CM

## 2018-11-19 DIAGNOSIS — M48062 Spinal stenosis, lumbar region with neurogenic claudication: Secondary | ICD-10-CM

## 2018-11-19 DIAGNOSIS — K219 Gastro-esophageal reflux disease without esophagitis: Secondary | ICD-10-CM

## 2018-11-19 DIAGNOSIS — I10 Essential (primary) hypertension: Secondary | ICD-10-CM | POA: Diagnosis not present

## 2018-11-19 DIAGNOSIS — F3289 Other specified depressive episodes: Secondary | ICD-10-CM | POA: Diagnosis not present

## 2018-11-19 MED ORDER — SERTRALINE HCL 100 MG PO TABS: 100.0000 mg | ORAL_TABLET | Freq: Every day | ORAL | 3 refills | Status: DC

## 2018-11-19 NOTE — Assessment & Plan Note (Signed)
GERD controlled Continue daily medication  

## 2018-11-19 NOTE — Assessment & Plan Note (Signed)
Chronic pain that is severe Significantly limited-no pain with sitting Following with Dr. Lovenia Shuck Will have injections just after Christmas If still having significant pain may retry spinal stimulator

## 2018-11-19 NOTE — Assessment & Plan Note (Signed)
Blood pressure slightly elevated here today-likely related to exertion getting back to the exam room Will monitor No change medications today We will hold off on blood work today

## 2018-11-19 NOTE — Progress Notes (Signed)
Follow-up Visit   Date: 11/19/18    Tamara Gregory MRN: [id number] DOB: March 17, 1925   Interim History: Tamara Gregory is a 84 y.o. Caucasian female with hypertension, depression, GERD, hyperlipidemia, chronic low back pain s/p spinal cord stimulator, and newly diagnosed colon cancer returning to the clinic for follow-up of ocular myasthenia.  The patient was accompanied to the clinic by husband who also provides collateral information.    History of present illness: She was diagnosed with myasthenia in 2013 by Dr. Vallarie Mare at [facility] by [facility] antibody testing. CT chest was negative.  Symptoms manifested with R > L ptosis.  She did not have any double vision, difficulty swallowing/talking, shortness of breath, or limb weakness.  She did not benefit with mestinon and discontinued this due to symptoms of malaise.  Prednisone 20mg  was started and slowly tapered to 5mg  in December 2014.  She has been on this dose for the past 3 years and reports having no exacerbations with MG.  She continues to have mild ptosis bilaterally, but it does not bother her.  Again, she denies double vision.   In late 2018, I offered to taper her prednisone and she did well on 4mg  daily, but started to have worsening ptosis with 3mg .  UPDATE 03/06/2018:  She is here for follow-up visit. She has been on prednisone 5mg  for 3 weeks and has not appreciated any improvement in the droopiness of her right eye. She denies any double vision, difficulty swallowing or talking.  She has been struggling with diarrhea for 3 months and yesterday has colonoscopy which showed malignant colon mass.  She is undergoing further imaging today.   UPDATE 11/19/2018:  She is here for follow-up visit. [place] returned to [facility] [place] and is eager to return to her apartment. She lives alone and manages her ADLs.  No home safety issues by patient or her husband.  She has been taking prednisone 20mg  for the past few  months and has noticed slight improvement such that her right eye is less droopy.  It is still not completely open, but she does not have spells of complete closure anymore.  She does not have double vision, difficulty swallowing/talking or weakness. Her bigger concern is ongoing severe low back pain. She is seeing Dr. Maryjean Ka next week for [facility].  Mood has been low because of her limited functioning by pain.    Medications:  Current Outpatient Medications on File Prior to Visit  Medication Sig Dispense Refill  . acetaminophen (TYLENOL) 500 MG tablet Take 2 tablets (1,000 mg total) by mouth 3 (three) times daily. 30 tablet 0  . aspirin 81 MG chewable tablet Chew 0.5 tablets (40.5 mg total) by mouth daily. 30 tablet 0  . Calcium Carbonate (CALCIUM 500 PO) Take 500 mg by mouth daily.     . Cholecalciferol (VITAMIN D) 2000 units tablet Take 2,000 Units by mouth daily.    . fluticasone (FLONASE) 50 MCG/ACT nasal spray Place 1 spray into both nostrils at bedtime.    . Glucosamine-Chondroit-Vit C-Mn (GLUCOSAMINE 1500 COMPLEX PO) Take 1 tablet by mouth daily.     [name] latanoprost (XALATAN) 0.005 % ophthalmic solution Place 1 drop into the left eye at bedtime.  1  . losartan-hydrochlorothiazide (HYZAAR) 50-12.5 MG tablet Take 1 tablet by mouth daily. 90 tablet 3  . Multiple Vitamins-Minerals (PRESERVISION AREDS) CAPS Take 1 capsule by mouth 2 (two) times daily.     . Omega-3 Fatty Acids (FISH  OIL) 435 MG CAPS Take 435 mg by mouth daily.    [name] omeprazole (PRILOSEC) 40 MG capsule Take 40 mg by mouth daily.     . predniSONE (DELTASONE) 10 MG tablet Take 2 tablets (20 mg total) by mouth daily with breakfast. 180 tablet 3  . pyridostigmine (MESTINON) 60 MG tablet Take 1 tablet (60 mg total) by mouth 2 (two) times daily. 180 tablet 3  . ranitidine (ZANTAC) 150 MG tablet Take 1 tablet (150 mg total) by mouth at bedtime. Annual appt due in Dec must see provider for future refills 90 tablet 0  . rosuvastatin (CRESTOR) 5  MG tablet TAKE 1 TABLET EVERY DAY IN THE EVENING 90 tablet 1  . traZODone (DESYREL) 50 MG tablet Take 1/2 to 1 tablet at bedtime as needed for sleep -- Office visit needed for further refills 90 tablet 0   No current facility-administered medications on file prior to visit.     Allergies: No Known Allergies  Review of Systems:  CONSTITUTIONAL: No fevers, chills, night sweats, +weight loss.  EYES: +visual changes or eye pain ENT: No hearing changes.  No history of nose bleeds.   RESPIRATORY: No cough, wheezing and shortness of breath.   CARDIOVASCULAR: Negative for chest pain, and palpitations.   GI: Negative for abdominal discomfort, blood in stools or black stools.   GU:  No history of incontinence.   MUSCLOSKELETAL: +history of joint pain or swelling.  No myalgias.   SKIN: Negative for lesions, rash, and itching.   ENDOCRINE: Negative for cold or heat intolerance, polydipsia or goiter.   PSYCH:  No depression or anxiety symptoms.   NEURO: As Above.   Vital Signs:  BP 130/60   Pulse 96   Ht 5' (1.524 m)   Wt 147 lb (66.7 kg)   SpO2 93%   BMI 28.71 kg/m   General Medical Exam:   General:  Well appearing, comfortable  Eyes/ENT: see cranial nerve examination.   Neck: No masses appreciated.  Full range of motion without tenderness.  No carotid bruits. Respiratory:  Clear to auscultation, good air entry bilaterally.   Cardiac:  Regular rate and rhythm, harsh systolic murmur.   Ext:  Multiple echhymosis, very thin and friable skin  Neurological Exam: MENTAL STATUS including orientation to time, place, person, recent and remote memory, attention span and concentration, language, and fund of knowledge is normal.  Speech is not dysarthric.  CRANIAL NERVES:   Pupils equal round and reactive to light.  Normal conjugate, extra-ocular eye movements in all directions of gaze.  Mild-to-moderate right ptosis with upper eye lid up covering upper 25% of pupil, very mild ptosis on the left.  No worsening with sustained upgaze. Facial muscles are 5/5.   Face is symmetric. Palate elevates symmetrically.  Tongue is midline.  MOTOR:  Motor strength is 5/5 in all extremities, no fatigability.  No pronator drift.  Tone is normal.    COORDINATION/GAIT:  Stooped posture, unsteady gait, assisted with rollator  Data: Labs 01/2012:  + ACh R antibody at .81 (low range of + >.3)  IMPRESSION/PLAN: Seropositive ocular myasthenia gravis with exacerbation, diagnosed 2013 at [facility] by Dr. Vallarie Mare.  She was initially treated with prednisone 20 mg daily and over the years was tapered down to prednisone 5 mg.  In February 2019, she was down to prednisone 3 mg, however developed worsening right ptosis so restarted at 5 mg daily.  During the fall of 2019, her ptosis became worse where he was  obstructing vision, therefore prednisone was increased to 20mg  daily.  Her exam continues to show mild-moderate right ptosis, which is better than before, but still not ideal.  We discussed increasing the dose of prednisone to 25mg  but given that her vision is no longer obstructed, it does not make sense to titrate her steroids and expose her to greater corticosteroid side effects with purely ptosis as her only MG symptom; therefore, I will plan to keep her on prednisone 20mg /d.  Continue Mestinon 60 mg twice daily at 8 AM and 3 PM.  Long-term corticosteroid use.   Continue Pepcid and calcium with vitamin D supplements.     Call with update in 3 months and return to clinic when she is back in town.    Thank you for allowing me to participate in patient's care.  If I can answer any additional questions, I would be pleased to do so.    Sincerely,    Brytni Dray K. Posey Pronto, DO

## 2018-11-19 NOTE — Patient Instructions (Signed)
Continue prednisone 20mg  daily  Continue mestinon 60mg  at 8am and 2pm  Call with an update in 6 months, or sooner as needed

## 2018-11-19 NOTE — Patient Instructions (Addendum)
  Medications reviewed and updated.  Changes include :   Increase sertraline to 100 mg daily -- we can increase this again in 4 weeks    Please followup in 6 months

## 2018-11-19 NOTE — Assessment & Plan Note (Signed)
Controlled with current dose of trazodone Continue trazodone at current dose

## 2018-11-19 NOTE — Assessment & Plan Note (Signed)
Not controlled Increase sertraline to 100 mg daily After 4 weeks if she is still having significant depression we can consider increasing further to 150 mg daily

## 2018-11-26 DIAGNOSIS — M47816 Spondylosis without myelopathy or radiculopathy, lumbar region: Secondary | ICD-10-CM | POA: Diagnosis not present

## 2018-12-04 ENCOUNTER — Ambulatory Visit (INDEPENDENT_AMBULATORY_CARE_PROVIDER_SITE_OTHER): Payer: Medicare Other | Admitting: Family Medicine

## 2018-12-04 VITALS — BP 110/70 | HR 94 | Resp 16

## 2018-12-04 DIAGNOSIS — R531 Weakness: Secondary | ICD-10-CM | POA: Diagnosis not present

## 2018-12-04 NOTE — Patient Instructions (Signed)
Nice to meet you  Please try to get up and exercise  Please try using a heating pad on the back  Please try aspercreame with lidocaine  Have a good weekend.

## 2018-12-04 NOTE — Assessment & Plan Note (Signed)
Unclear if this is an acute change or an ongoing problem. Doesn't present with any signs of infection. Had anti-depressant dose changed recently but unsure if contributing. She would like to sit and do puzzles most of the day. Unclear if this is associated with her myasthenia gravis.  - counseled on supportive care - encouraged as much as exercise as she tolerates  - could consider home health PT  - may need follow up with neuro if symptoms progress.

## 2018-12-04 NOTE — Progress Notes (Signed)
Tamara Gregory - 85 y.o. female MRN [id number]  Date of birth: October 03, 1925  SUBJECTIVE:  Including CC & ROS.  No chief complaint on file.   Tamara Gregory is a 85 y.o. female that is presenting with malaise.  She feels like she does not have the energy to do anything.  She feels like she gets drained fairly easily.  This is been ongoing for some time.  Her antidepressant was changed a couple weeks ago.  Husband helps provide some of history and is unsure if that is contributing.  She is would like to sit down and work on her puzzles.  She has a long history of back problems with back pain.  She has recently been given facet injections with limited improvement.  She sees Dr. Maryjean Ka for that.  She denies any illness or fevers.  She sleeps well at night.  No other changes to medications.  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  She is being treated with prednisone for myasthenia gravis..   Review of Systems  Constitutional: Negative for fever.  HENT: Negative for congestion.   Respiratory: Negative for cough.   Cardiovascular: Negative for chest pain.  Gastrointestinal: Negative for abdominal pain.  Musculoskeletal: Positive for arthralgias and back pain.  Skin: Negative for color change.  Neurological: Positive for weakness.  Hematological: Negative for adenopathy.  Psychiatric/Behavioral: The patient is nervous/anxious.     HISTORY: Past Medical, Surgical, Social, and Family History Reviewed & Updated per EMR.   Pertinent Historical Findings include:  Past Medical History:  Diagnosis Date  . Anemia   . Anxiety   . Arthritis   . Cancer (HCC)    hx of skin cancer , colon   . Cataract    removed both eyes   . Chronic back pain    okay with sitting, increased back pain with activity   . Depression   . GERD (gastroesophageal reflux disease)   . Glaucoma   . Headache(784.0)    hx of  . History of kidney stones   . History of migraines   . Hyperlipidemia   . Hypertension    . Macular degeneration   . Myasthenia gravis ([place])   . Occasional tremors   . Pneumonia   . Spinal stenosis   . Thoracic aneurysm    4.4cm; see CT done 01/28/12 and 01/22/13 in EPIC.   [name] Uses hearing aid   . Vitamin D deficiency     Past Surgical History:  Procedure Laterality Date  . ABDOMINAL HYSTERECTOMY    . CHOLECYSTECTOMY    . COLON SURGERY     Right colectomy dr. gross 03-17-18  . LUMBAR LAMINECTOMY/DECOMPRESSION MICRODISCECTOMY N/A 02/04/2013   Procedure: CENTRAL DECOMPRESSION/LUMBAR LAMINECTOMY L3-L4 AND L4-L5 2 LEVELS;  Surgeon: Tobi Bastos, MD;  Location: [facility];  Service: Orthopedics;  Laterality: N/A;  . SPINAL CORD STIMULATOR IMPLANT     in right hip but not currently using because it did not help  . TONSILLECTOMY    . UPPER GASTROINTESTINAL ENDOSCOPY    . US ECHOCARDIOGRAPHY  05/18/2007   EF 55-60%    No Known Allergies  Family History  Problem Relation Age of Onset  . Heart attack Father   . Diabetes Father   . Cancer Sister   . Breast cancer Sister   . Cancer Brother   . Lung cancer Brother   . Cancer Sister   . Liver cancer Sister   . Diabetes Sister   .  Colon polyps Neg Hx   . Colon cancer Neg Hx   . Esophageal cancer Neg Hx   . Rectal cancer Neg Hx   . Stomach cancer Neg Hx   . Adrenal disorder Neg Hx      Social History   Socioeconomic History  . Marital status: Married    Spouse name: Not on file  . Number of children: Not on file  . Years of education: Not on file  . Highest education level: Not on file  Occupational History  . Not on file  Social Needs  . Financial resource strain: Not on file  . Food insecurity:    Worry: Not on file    Inability: Not on file  . Transportation needs:    Medical: Not on file    Non-medical: Not on file  Tobacco Use  . Smoking status: Never Smoker  . Smokeless tobacco: Never Used  Substance and Sexual Activity  . Alcohol use: No  . Drug use: No  . Sexual activity: Not Currently    Lifestyle  . Physical activity:    Days per week: Not on file    Minutes per session: Not on file  . Stress: Not on file  Relationships  . Social connections:    Talks on phone: Not on file    Gets together: Not on file    Attends religious service: Not on file    Active member of club or organization: Not on file    Attends meetings of clubs or organizations: Not on file    Relationship status: Not on file  . Intimate partner violence:    Fear of current or ex partner: Not on file    Emotionally abused: Not on file    Physically abused: Not on file    Forced sexual activity: Not on file  Other Topics Concern  . Not on file  Social History Narrative   Lives with husband in a one story home.  Has 2 sons.  Retired.  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  Education: college.  [employer].     PHYSICAL EXAM:  VS: BP 110/70   Pulse 94   Resp 16   SpO2 94%  Physical Exam Gen: NAD, alert, cooperative with exam,  ENT: normal lips, normal nasal mucosa,  Eye: normal EOM, normal conjunctiva and lids CV:  no edema, +2 pedal pulses   Resp: no accessory muscle use, non-labored,  Skin: no rashes, no areas of induration  Neuro: normal tone, normal sensation to touch Psych:  normal insight, alert and oriented MSK: sitting in wheelchair, no signs of atrophy.      ASSESSMENT & PLAN:   Weakness Unclear if this is an acute change or an ongoing problem. Doesn't present with any signs of infection. Had anti-depressant dose changed recently but unsure if contributing. She would like to sit and do puzzles most of the day. Unclear if this is associated with her myasthenia gravis.  - counseled on supportive care - encouraged as much as exercise as she tolerates  - could consider home health PT  - may need follow up with neuro if symptoms progress.

## 2018-12-07 ENCOUNTER — Telehealth: Payer: Self-pay | Admitting: Neurology

## 2018-12-07 DIAGNOSIS — C44529 Squamous cell carcinoma of skin of other part of trunk: Secondary | ICD-10-CM | POA: Diagnosis not present

## 2018-12-07 DIAGNOSIS — D1801 Hemangioma of skin and subcutaneous tissue: Secondary | ICD-10-CM | POA: Diagnosis not present

## 2018-12-07 DIAGNOSIS — Z85828 Personal history of other malignant neoplasm of skin: Secondary | ICD-10-CM | POA: Diagnosis not present

## 2018-12-07 DIAGNOSIS — L570 Actinic keratosis: Secondary | ICD-10-CM | POA: Diagnosis not present

## 2018-12-07 DIAGNOSIS — D225 Melanocytic nevi of trunk: Secondary | ICD-10-CM | POA: Diagnosis not present

## 2018-12-07 DIAGNOSIS — D485 Neoplasm of uncertain behavior of skin: Secondary | ICD-10-CM | POA: Diagnosis not present

## 2018-12-07 DIAGNOSIS — L821 Other seborrheic keratosis: Secondary | ICD-10-CM | POA: Diagnosis not present

## 2018-12-07 NOTE — Telephone Encounter (Signed)
Patient's husband says that she is not doing well and would like to cut back on her prednisone.  Please advise.  (he requested for me to call him tomorrow afternoon)

## 2018-12-07 NOTE — Telephone Encounter (Signed)
Please find out what symptoms she is having (double vision, difficulty swallowing, etc).  We can reduce prednisone to 15mg  daily and have to accept some droopiness of the eyelid.    Cambrea Kirt K. Posey Pronto, DO

## 2018-12-07 NOTE — Telephone Encounter (Signed)
Husband left vm about needing to speak with Doctor or nurse about patient. Please call him back at [phone]. Thanks!

## 2018-12-08 NOTE — Telephone Encounter (Signed)
Left message for Tamara Gregory that he can decrease the prednisone to 15 mg daily but she may have to accept the droopiness of Tamara Gregory eyelid.  Requested for him to call me with Tamara Gregory symptoms per Dr. Serita Grit request.

## 2018-12-09 NOTE — Telephone Encounter (Signed)
Patient's husband said he would do the 15mg  of prednisone daily and that her symptom was that she was very unhappy/moody. That was the only symptom she was showing. If he needs to do anything else please call him back. Thanks

## 2018-12-10 NOTE — Telephone Encounter (Signed)
Called and spoke with Tamara Gregory. He will reduce prednisone to 15 mg.

## 2018-12-10 NOTE — Telephone Encounter (Signed)
If no signs of worsening weakness, then it is ok to reduce prednisone to 15mg  daily and see if that helps her mood.   Thanks.

## 2018-12-29 NOTE — Progress Notes (Signed)
Subjective:    Patient ID: Tamara Gregory, female    DOB: [birth year], 85 y.o.   MRN: [id number]  HPI The patient is here for an acute visit.  She is here with her husband.  Chronic back pain: She is severe, chronic back pain.  She is following with Dr. Lovenia Shuck.  She had injections over the past month and they did not help.  She actually feels that her back pain is worse.  With the increased back pain she has been doing less and some days has been staying in bed for 16 hours.  Increased back pain is worsening her depression.  Her husband wondered about a pain patch to contain her narcotic medication and if that would help her.  They had a friend that it did help when nothing else did.  Depression: She is taking the sertraline daily as prescribed.  She still feels depressed.  Her depression is really related to not being able to do what she wants to do and being in chronic pain.       Medications and allergies reviewed with patient and updated if appropriate.  Patient Active Problem List   Diagnosis Date Noted  . Weakness 12/04/2018  . Hypokalemia 03/18/2018  . 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 Right ovarian cyst s/p RSO 03/17/2018  . Diarrhea 12/23/2017  . Large hiatal hernia 04/01/2017  . Sweating profusely 03/19/2017  . Hyperglycemia 03/19/2017  . Squamous cell skin cancer 11/05/2016  . Nosebleed 10/19/2016  . Osteopenia 08/28/2016  . Essential hypertension, benign 08/21/2016  . Long term current use of systemic steroids 08/21/2016  . GERD (gastroesophageal reflux disease) 05/06/2016  . Mixed incontinence urge and stress 05/06/2016  . Low hemoglobin 09/22/2014  . Insomnia 07/26/2014  . Malaise and fatigue 10/04/2013  . PAC (premature atrial contraction) 10/04/2013  . Depression 02/15/2013  . Spinal stenosis, lumbar region, with neurogenic claudication 02/04/2013  . Myasthenia gravis ([place]) 03/17/2012  . Pure hypercholesterolemia 09/05/2011    . Benign hypertensive heart disease without heart failure 09/05/2011  . Osteoarthritis 09/05/2011    Current Outpatient Medications on File Prior to Visit  Medication Sig Dispense Refill  . acetaminophen (TYLENOL) 500 MG tablet Take 2 tablets (1,000 mg total) by mouth 3 (three) times daily. 30 tablet 0  . aspirin 81 MG chewable tablet Chew 0.5 tablets (40.5 mg total) by mouth daily. 30 tablet 0  . Calcium Carbonate (CALCIUM 500 PO) Take 500 mg by mouth daily.     . Cholecalciferol (VITAMIN D) 2000 units tablet Take 2,000 Units by mouth daily.    . fluticasone (FLONASE) 50 MCG/ACT nasal spray Place 1 spray into both nostrils at bedtime.    . Glucosamine-Chondroit-Vit C-Mn (GLUCOSAMINE 1500 COMPLEX PO) Take 1 tablet by mouth daily.     [name] latanoprost (XALATAN) 0.005 % ophthalmic solution Place 1 drop into the left eye at bedtime.  1  . losartan-hydrochlorothiazide (HYZAAR) 50-12.5 MG tablet Take 1 tablet by mouth daily. 90 tablet 3  . Multiple Vitamins-Minerals (PRESERVISION AREDS) CAPS Take 1 capsule by mouth 2 (two) times daily.     . Omega-3 Fatty Acids (FISH OIL) 435 MG CAPS Take 435 mg by mouth daily.    . predniSONE (DELTASONE) 10 MG tablet Take 2 tablets (20 mg total) by mouth daily with breakfast. 180 tablet 3  . pyridostigmine (MESTINON) 60 MG tablet Take 1 tablet (60 mg total) by mouth 2 (two) times daily. 180 tablet 3  No current facility-administered medications on file prior to visit.     Past Medical History:  Diagnosis Date  . Anemia   . Anxiety   . Arthritis   . Cancer (HCC)    hx of skin cancer , colon   . Cataract    removed both eyes   . Chronic back pain    okay with sitting, increased back pain with activity   . Depression   . GERD (gastroesophageal reflux disease)   . Glaucoma   . Headache(784.0)    hx of  . History of kidney stones   . History of migraines   . Hyperlipidemia   . Hypertension   . Macular degeneration   . Myasthenia gravis ([place])   .  Occasional tremors   . Pneumonia   . Spinal stenosis   . Thoracic aneurysm    4.4cm; see CT done 01/28/12 and 01/22/13 in [facility].   [name] Uses hearing aid   . Vitamin D deficiency     Past Surgical History:  Procedure Laterality Date  . ABDOMINAL HYSTERECTOMY    . CHOLECYSTECTOMY    . COLON SURGERY     Right colectomy dr. gross 03-17-18  . LUMBAR LAMINECTOMY/DECOMPRESSION MICRODISCECTOMY N/A 02/04/2013   Procedure: CENTRAL DECOMPRESSION/LUMBAR LAMINECTOMY L3-L4 AND L4-L5 2 LEVELS;  Surgeon: Tobi Bastos, MD;  Location: [facility];  Service: Orthopedics;  Laterality: N/A;  . SPINAL CORD STIMULATOR IMPLANT     in right hip but not currently using because it did not help  . TONSILLECTOMY    . UPPER GASTROINTESTINAL ENDOSCOPY    . US ECHOCARDIOGRAPHY  05/18/2007   EF 55-60%    Social History   Socioeconomic History  . Marital status: Married    Spouse name: Not on file  . Number of children: Not on file  . Years of education: Not on file  . Highest education level: Not on file  Occupational History  . Not on file  Social Needs  . Financial resource strain: Not on file  . Food insecurity:    Worry: Not on file    Inability: Not on file  . Transportation needs:    Medical: Not on file    Non-medical: Not on file  Tobacco Use  . Smoking status: Never Smoker  . Smokeless tobacco: Never Used  Substance and Sexual Activity  . Alcohol use: No  . Drug use: No  . Sexual activity: Not Currently  Lifestyle  . Physical activity:    Days per week: Not on file    Minutes per session: Not on file  . Stress: Not on file  Relationships  . Social connections:    Talks on phone: Not on file    Gets together: Not on file    Attends religious service: Not on file    Active member of club or organization: Not on file    Attends meetings of clubs or organizations: Not on file    Relationship status: Not on file  Other Topics Concern  . Not on file  Social History Narrative   Lives with  husband in a one story home.  Has 2 sons.  Retired.  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  Education: college.  [employer].    Family History  Problem Relation Age of Onset  . Heart attack Father   . Diabetes Father   . Cancer Sister   . Breast cancer Sister   . Cancer Brother   .  Lung cancer Brother   . Cancer Sister   . Liver cancer Sister   . Diabetes Sister   . Colon polyps Neg Hx   . Colon cancer Neg Hx   . Esophageal cancer Neg Hx   . Rectal cancer Neg Hx   . Stomach cancer Neg Hx   . Adrenal disorder Neg Hx     Review of Systems     Objective:   Vitals:   12/30/18 1455  BP: 120/68  Pulse: 68  Resp: 16  Temp: 98.2 F (36.8 C)  SpO2: 93%   BP Readings from Last 3 Encounters:  12/30/18 120/68  12/04/18 110/70  11/19/18 130/60   Wt Readings from Last 3 Encounters:  11/19/18 147 lb (66.7 kg)  11/19/18 145 lb (65.8 kg)  08/25/18 143 lb (64.9 kg)   Body mass index is 28.71 kg/m.   Physical Exam Constitutional:      General: She is not in acute distress.    Appearance: Normal appearance.  Neurological:     Mental Status: She is alert.  Psychiatric:        Behavior: Behavior normal.        Thought Content: Thought content normal.        Judgment: Judgment normal.     Comments: Depressed affect            Assessment & Plan:   15 minutes were spent face-to-face with the patient, over 50% of which was spent counseling regarding her uncontrolled depression including possibly seeing a therapist and psychiatrist.  We discussed medication options and discussed increasing her sertraline dose, which we ultimately did.  Discussed that her depression is centered around her back pain.  We also discussed her back pain and advised that she follow-up initially with Dr. Lovenia Shuck and may need referral to pain management.   See Problem List for Assessment and Plan of chronic medical problems.

## 2018-12-30 ENCOUNTER — Encounter: Payer: Self-pay | Admitting: Internal Medicine

## 2018-12-30 ENCOUNTER — Ambulatory Visit (INDEPENDENT_AMBULATORY_CARE_PROVIDER_SITE_OTHER): Payer: Medicare Other | Admitting: Internal Medicine

## 2018-12-30 VITALS — BP 120/68 | HR 68 | Temp 98.2°F | Resp 16 | Ht 60.0 in

## 2018-12-30 DIAGNOSIS — M48062 Spinal stenosis, lumbar region with neurogenic claudication: Secondary | ICD-10-CM | POA: Diagnosis not present

## 2018-12-30 DIAGNOSIS — F3289 Other specified depressive episodes: Secondary | ICD-10-CM | POA: Diagnosis not present

## 2018-12-30 MED ORDER — SERTRALINE HCL 100 MG PO TABS: 150.0000 mg | ORAL_TABLET | Freq: Every day | ORAL | 3 refills | Status: DC

## 2018-12-30 MED ORDER — ROSUVASTATIN CALCIUM 5 MG PO TABS: ORAL_TABLET | ORAL | 1 refills | Status: DC

## 2018-12-30 NOTE — Assessment & Plan Note (Signed)
Chronic, severe and intermittent back pain Following with Dr. Lovenia Shuck Had injections, which did not help and may have made the pain worse Dr. Lovenia Shuck may retry spinal stimulator There has been asked about fentanyl-advised that they are following with pain management and they need to ask him about this and if he does not wish just to prescribe pain medication then they will let me know and I will refer to a different pain management doctor Pain is significantly affecting her quality of life and she would benefit from pain medication with medications

## 2018-12-30 NOTE — Assessment & Plan Note (Signed)
Depression is not ideally controlled Her depression is related to her uncontrolled back pain and how much that limits her We will go ahead and increase sertraline to 150 mg daily Discussed that she may benefit from seeing a therapist Also discussed to consider seeing a psychiatrist to help get her in better antidepressant medication that may help control her depression more, but I do think that if her back pain was controlled her depression would be controlled

## 2018-12-30 NOTE — Patient Instructions (Addendum)
Discuss pain management with Dr Lovenia Shuck.  We can consider referring your to a different pain management doctor if needed.  Increase the sertraline to 150 mg daily.  If you are still struggling with depression we can consider a referral to a therapist and psychiatrist.     Sertraline was sent to your pharmacy.

## 2019-01-05 DIAGNOSIS — M47816 Spondylosis without myelopathy or radiculopathy, lumbar region: Secondary | ICD-10-CM | POA: Diagnosis not present

## 2019-01-05 DIAGNOSIS — I10 Essential (primary) hypertension: Secondary | ICD-10-CM | POA: Diagnosis not present

## 2019-01-18 DIAGNOSIS — M47816 Spondylosis without myelopathy or radiculopathy, lumbar region: Secondary | ICD-10-CM | POA: Diagnosis not present

## 2019-01-19 ENCOUNTER — Telehealth: Payer: Self-pay | Admitting: Neurology

## 2019-01-19 NOTE — Telephone Encounter (Signed)
Husband left vm for you to call him at [phone]. Thanks!

## 2019-01-20 NOTE — Telephone Encounter (Signed)
Left message for Mr. Sharpnack to call me back.

## 2019-01-25 DIAGNOSIS — M47816 Spondylosis without myelopathy or radiculopathy, lumbar region: Secondary | ICD-10-CM | POA: Diagnosis not present

## 2019-01-26 DIAGNOSIS — Z961 Presence of intraocular lens: Secondary | ICD-10-CM | POA: Diagnosis not present

## 2019-01-26 DIAGNOSIS — H401131 Primary open-angle glaucoma, bilateral, mild stage: Secondary | ICD-10-CM | POA: Diagnosis not present

## 2019-01-26 DIAGNOSIS — H353130 Nonexudative age-related macular degeneration, bilateral, stage unspecified: Secondary | ICD-10-CM | POA: Diagnosis not present

## 2019-01-26 DIAGNOSIS — H35373 Puckering of macula, bilateral: Secondary | ICD-10-CM | POA: Diagnosis not present

## 2019-01-26 NOTE — Progress Notes (Signed)
Follow-up Visit   Date: 01/27/19    Tamara Gregory MRN: [id number] DOB: 09-20-1925   Interim History: Tamara Gregory is a 85 y.o. Caucasian female with hypertension, depression, GERD, hyperlipidemia, chronic low back pain s/p spinal cord stimulator, and colon cancer s/p resection returning to the clinic for follow-up of ocular myasthenia and new complaints of hallucinations.  The patient was accompanied to the clinic by husband who also provides collateral information.    History of present illness: She was diagnosed with myasthenia in 2013 by Dr. Vallarie Mare at [facility] by AChR antibody testing. CT chest was negative.  Symptoms manifested with R > L ptosis.  She did not have any double vision, difficulty swallowing/talking, shortness of breath, or limb weakness.  She did not benefit with mestinon and discontinued this due to symptoms of malaise.  Prednisone 20mg  was started and slowly tapered to 5mg  in December 2014.  She has been on this dose for the past 3 years and reports having no exacerbations with MG.  She continues to have mild ptosis bilaterally, but it does not bother her.  Again, she denies double vision.   In late 2018, I offered to taper her prednisone and she did well on 4mg  daily, but started to have worsening ptosis with 3mg .  In 2019, her prednisone was increased gradually to a maximal of 25mg /d which helped her ptosis.   UPDATE 11/19/2018:  She is here for follow-up visit. She returned to [facility] [place] yesterday and is eager to return to her apartment. She lives alone and manages her ADLs.  No home safety issues by patient or her husband.  She has been taking prednisone 20mg  for the past few months and has noticed slight improvement such that her right eye is less droopy.  It is still not completely open, but she does not have spells of complete closure anymore.  She does not have double vision, difficulty swallowing/talking or weakness. Her bigger  concern is ongoing severe low back pain. She is seeing Dr. Maryjean Ka next week for [facility].  Mood has been low because of her limited functioning by pain.    UPDATE 01/27/2019:   She scheduled sooner visit because she developed new visual and auditory hallucinations, music in her head, and intermittent tremors of the hand at night.  Her husband stopped tramadol and symptoms improved. There was no fever or illness during this time.  She was taking tramadol for her arthritis pain. Her right ptosis is doing well on prednisone 15mg , no double vision or swallowing difficulty.  She has lack of interests in puzzles and reading, which she previously enjoyed and has become less engaged.  Husband states that she can sleep up to 14 hours per day.   Medications:  Current Outpatient Medications on File Prior to Visit  Medication Sig Dispense Refill  . acetaminophen (TYLENOL) 500 MG tablet Take 2 tablets (1,000 mg total) by mouth 3 (three) times daily. 30 tablet 0  . aspirin 81 MG chewable tablet Chew 0.5 tablets (40.5 mg total) by mouth daily. 30 tablet 0  . Calcium Carbonate (CALCIUM 500 PO) Take 500 mg by mouth daily.     . Cholecalciferol (VITAMIN D) 2000 units tablet Take 2,000 Units by mouth daily.    . fluticasone (FLONASE) 50 MCG/ACT nasal spray Place 1 spray into both nostrils at bedtime.    . Glucosamine-Chondroit-Vit C-Mn (GLUCOSAMINE 1500 COMPLEX PO) Take 1 tablet by mouth daily.     [name]  latanoprost (XALATAN) 0.005 % ophthalmic solution Place 1 drop into the left eye at bedtime.  1  . losartan-hydrochlorothiazide (HYZAAR) 50-12.5 MG tablet Take 1 tablet by mouth daily. 90 tablet 3  . Multiple Vitamins-Minerals (PRESERVISION AREDS) CAPS Take 1 capsule by mouth 2 (two) times daily.     . Omega-3 Fatty Acids (FISH OIL) 435 MG CAPS Take 435 mg by mouth daily.    . predniSONE (DELTASONE) 10 MG tablet Take 2 tablets (20 mg total) by mouth daily with breakfast. (Patient taking differently: Take 15 mg by mouth daily  with breakfast. ) 180 tablet 3  . rosuvastatin (CRESTOR) 5 MG tablet TAKE 1 TABLET EVERY DAY IN THE EVENING 90 tablet 1  . sertraline (ZOLOFT) 100 MG tablet Take 1.5 tablets (150 mg total) by mouth at bedtime. 135 tablet 3   No current facility-administered medications on file prior to visit.     Allergies: No Known Allergies  Review of Systems:  CONSTITUTIONAL: No fevers, chills, night sweats, +weight loss.  EYES: +visual changes or eye pain ENT: No hearing changes.  No history of nose bleeds.   RESPIRATORY: No cough, wheezing and shortness of breath.   CARDIOVASCULAR: Negative for chest pain, and palpitations.   GI: Negative for abdominal discomfort, blood in stools or black stools.   GU:  No history of incontinence.   MUSCLOSKELETAL: +history of joint pain or swelling.  No myalgias.   SKIN: Negative for lesions, rash, and itching.   ENDOCRINE: Negative for cold or heat intolerance, polydipsia or goiter.   PSYCH:  No depression or anxiety symptoms.   NEURO: As Above.   Vital Signs:  BP 106/74   Pulse 86   SpO2 96%   General Medical Exam:   General:  Elderly appearing, comfortable  Eyes/ENT: see cranial nerve examination.   Neck:   No carotid bruits. Respiratory:  Clear to auscultation, good air entry bilaterally.   Cardiac:  Regular rate and rhythm, harsh systolic murmur.   Ext:  No edema  Neurological Exam: MENTAL STATUS including orientation to time, place, person, recent and remote memory, attention span and concentration, language, and fund of knowledge is normal.  Speech is not dysarthric.  CRANIAL NERVES:   Pupils equal round and reactive to light.  Normal conjugate, extra-ocular eye movements in all directions of gaze. Mild right ptosis (improved). No worsening with sustained upgaze. Facial muscles are 5/5.   Face is symmetric. Palate elevates symmetrically.  Tongue is midline.  MOTOR:  Motor strength is 5/5 in all extremities, no fatigability.  No pronator drift.   Tone is normal. Trace tremors of the hands when outstretched  COORDINATION/GAIT:  Arrived in wheelchair today, gait not tested  Data: Labs 01/2012:  + ACh R antibody at .81 (low range of + >.3)  IMPRESSION/PLAN: Seropositive ocular myasthenia gravis with exacerbation, diagnosed 2013 at [facility] by Dr. Vallarie Mare.  She was initially treated with prednisone 20 mg daily and over the years was tapered down to prednisone 5 mg.  In February 2019, she was down to prednisone 3 mg, however developed worsening right ptosis so restarted at 5 mg daily.  During the fall of 2019, her ptosis became worse where he was obstructing vision, therefore prednisone was increased to 25mg  daily, which helped. She has been on tapering dose of prednisone and doing well. Reduce prednisone to 12mg  daily x 2 weeks, then 10mg  daily. Continue Mestinon 60 mg twice daily at 8 AM and 3 PM.  Long-term corticosteroid use.  Continue Pepcid and calcium with vitamin D supplements.     Delirium secondary to tramadol, improved after stopping medication.    Thank you for allowing me to participate in patient's care.  If I can answer any additional questions, I would be pleased to do so.    Sincerely,    Donika K. Posey Pronto, DO

## 2019-01-27 ENCOUNTER — Ambulatory Visit: Payer: Medicare Other | Admitting: Neurology

## 2019-01-27 ENCOUNTER — Ambulatory Visit (INDEPENDENT_AMBULATORY_CARE_PROVIDER_SITE_OTHER): Payer: Medicare Other | Admitting: *Deleted

## 2019-01-27 ENCOUNTER — Encounter: Payer: Self-pay | Admitting: Internal Medicine

## 2019-01-27 ENCOUNTER — Encounter: Payer: Self-pay | Admitting: Neurology

## 2019-01-27 ENCOUNTER — Telehealth: Payer: Self-pay | Admitting: *Deleted

## 2019-01-27 VITALS — BP 136/82 | HR 78 | Resp 17 | Ht 60.0 in | Wt 146.0 lb

## 2019-01-27 VITALS — BP 106/74 | HR 86

## 2019-01-27 DIAGNOSIS — R410 Disorientation, unspecified: Secondary | ICD-10-CM

## 2019-01-27 DIAGNOSIS — G7000 Myasthenia gravis without (acute) exacerbation: Secondary | ICD-10-CM | POA: Diagnosis not present

## 2019-01-27 DIAGNOSIS — Z0000 Encounter for general adult medical examination without abnormal findings: Secondary | ICD-10-CM

## 2019-01-27 NOTE — Patient Instructions (Signed)
Continue doing brain stimulating activities (puzzles, reading, adult coloring books, staying active) to keep memory sharp.   Continue to eat heart healthy diet (full of fruits, vegetables, whole grains, lean protein, water--limit salt, fat, and sugar intake) and increase physical activity as tolerated.  Health Maintenance, Female Adopting a healthy lifestyle and getting preventive care can go a long way to promote health and wellness. Talk with your health care provider about what schedule of regular examinations is right for you. This is a good chance for you to check in with your provider about disease prevention and staying healthy. In between checkups, there are plenty of things you can do on your own. Experts have done a lot of research about which lifestyle changes and preventive measures are most likely to keep you healthy. Ask your health care provider for more information. Weight and diet Eat a healthy diet  Be sure to include plenty of vegetables, fruits, low-fat dairy products, and lean protein.  Do not eat a lot of foods high in solid fats, added sugars, or salt.  Get regular exercise. This is one of the most important things you can do for your health. ? Most adults should exercise for at least 150 minutes each week. The exercise should increase your heart rate and make you sweat (moderate-intensity exercise). ? Most adults should also do strengthening exercises at least twice a week. This is in addition to the moderate-intensity exercise. Maintain a healthy weight  Body mass index (BMI) is a measurement that can be used to identify possible weight problems. It estimates body fat based on height and weight. Your health care provider can help determine your BMI and help you achieve or maintain a healthy weight.  For females 20 years of age and older: ? A BMI below 18.5 is considered underweight. ? A BMI of 18.5 to 24.9 is normal. ? A BMI of 25 to 29.9 is considered  overweight. ? A BMI of 30 and above is considered obese. Watch levels of cholesterol and blood lipids  You should start having your blood tested for lipids and cholesterol at 83 years of age, then have this test every 5 years.  You may need to have your cholesterol levels checked more often if: ? Your lipid or cholesterol levels are high. ? You are older than 83 years of age. ? You are at high risk for heart disease. Cancer screening Lung Cancer  Lung cancer screening is recommended for adults 55-80 years old who are at high risk for lung cancer because of a history of smoking.  A yearly low-dose CT scan of the lungs is recommended for people who: ? Currently smoke. ? Have quit within the past 15 years. ? Have at least a 30-pack-year history of smoking. A pack year is smoking an average of one pack of cigarettes a day for 1 year.  Yearly screening should continue until it has been 15 years since you quit.  Yearly screening should stop if you develop a health problem that would prevent you from having lung cancer treatment. Breast Cancer  Practice breast self-awareness. This means understanding how your breasts normally appear and feel.  It also means doing regular breast self-exams. Let your health care provider know about any changes, no matter how small.  If you are in your 20s or 30s, you should have a clinical breast exam (CBE) by a health care provider every 1-3 years as part of a regular health exam.  If you are 40 or   older, have a CBE every year. Also consider having a breast X-ray (mammogram) every year.  If you have a family history of breast cancer, talk to your health care provider about genetic screening.  If you are at high risk for breast cancer, talk to your health care provider about having an MRI and a mammogram every year.  Breast cancer gene (BRCA) assessment is recommended for women who have family members with BRCA-related cancers. BRCA-related cancers  include: ? Breast. ? Ovarian. ? Tubal. ? Peritoneal cancers.  Results of the assessment will determine the need for genetic counseling and BRCA1 and BRCA2 testing. Cervical Cancer Your health care provider may recommend that you be screened regularly for cancer of the pelvic organs (ovaries, uterus, and vagina). This screening involves a pelvic examination, including checking for microscopic changes to the surface of your cervix (Pap test). You may be encouraged to have this screening done every 3 years, beginning at age 21.  For women ages 30-65, health care providers may recommend pelvic exams and Pap testing every 3 years, or they may recommend the Pap and pelvic exam, combined with testing for human papilloma virus (HPV), every 5 years. Some types of HPV increase your risk of cervical cancer. Testing for HPV may also be done on women of any age with unclear Pap test results.  Other health care providers may not recommend any screening for nonpregnant women who are considered low risk for pelvic cancer and who do not have symptoms. Ask your health care provider if a screening pelvic exam is right for you.  If you have had past treatment for cervical cancer or a condition that could lead to cancer, you need Pap tests and screening for cancer for at least 20 years after your treatment. If Pap tests have been discontinued, your risk factors (such as having a new sexual partner) need to be reassessed to determine if screening should resume. Some women have medical problems that increase the chance of getting cervical cancer. In these cases, your health care provider may recommend more frequent screening and Pap tests. Colorectal Cancer  This type of cancer can be detected and often prevented.  Routine colorectal cancer screening usually begins at 83 years of age and continues through 83 years of age.  Your health care provider may recommend screening at an earlier age if you have risk factors for  colon cancer.  Your health care provider may also recommend using home test kits to check for hidden blood in the stool.  A small camera at the end of a tube can be used to examine your colon directly (sigmoidoscopy or colonoscopy). This is done to check for the earliest forms of colorectal cancer.  Routine screening usually begins at age 50.  Direct examination of the colon should be repeated every 5-10 years through 83 years of age. However, you may need to be screened more often if early forms of precancerous polyps or small growths are found. Skin Cancer  Check your skin from head to toe regularly.  Tell your health care provider about any new moles or changes in moles, especially if there is a change in a mole's shape or color.  Also tell your health care provider if you have a mole that is larger than the size of a pencil eraser.  Always use sunscreen. Apply sunscreen liberally and repeatedly throughout the day.  Protect yourself by wearing long sleeves, pants, a wide-brimmed hat, and sunglasses whenever you are outside. Heart disease, diabetes,   and high blood pressure  High blood pressure causes heart disease and increases the risk of stroke. High blood pressure is more likely to develop in: ? People who have blood pressure in the high end of the normal range (130-139/85-89 mm Hg). ? People who are overweight or obese. ? People who are African American.  If you are 18-39 years of age, have your blood pressure checked every 3-5 years. If you are 40 years of age or older, have your blood pressure checked every year. You should have your blood pressure measured twice-once when you are at a hospital or clinic, and once when you are not at a hospital or clinic. Record the average of the two measurements. To check your blood pressure when you are not at a hospital or clinic, you can use: ? An automated blood pressure machine at a pharmacy. ? A home blood pressure monitor.  If you are  between 55 years and 79 years old, ask your health care provider if you should take aspirin to prevent strokes.  Have regular diabetes screenings. This involves taking a blood sample to check your fasting blood sugar level. ? If you are at a normal weight and have a low risk for diabetes, have this test once every three years after 83 years of age. ? If you are overweight and have a high risk for diabetes, consider being tested at a younger age or more often. Preventing infection Hepatitis B  If you have a higher risk for hepatitis B, you should be screened for this virus. You are considered at high risk for hepatitis B if: ? You were born in a country where hepatitis B is common. Ask your health care provider which countries are considered high risk. ? Your parents were born in a high-risk country, and you have not been immunized against hepatitis B (hepatitis B vaccine). ? You have HIV or AIDS. ? You use needles to inject street drugs. ? You live with someone who has hepatitis B. ? You have had sex with someone who has hepatitis B. ? You get hemodialysis treatment. ? You take certain medicines for conditions, including cancer, organ transplantation, and autoimmune conditions. Hepatitis C  Blood testing is recommended for: ? Everyone born from 1945 through 1965. ? Anyone with known risk factors for hepatitis C. Sexually transmitted infections (STIs)  You should be screened for sexually transmitted infections (STIs) including gonorrhea and chlamydia if: ? You are sexually active and are younger than 83 years of age. ? You are older than 83 years of age and your health care provider tells you that you are at risk for this type of infection. ? Your sexual activity has changed since you were last screened and you are at an increased risk for chlamydia or gonorrhea. Ask your health care provider if you are at risk.  If you do not have HIV, but are at risk, it may be recommended that you take  a prescription medicine daily to prevent HIV infection. This is called pre-exposure prophylaxis (PrEP). You are considered at risk if: ? You are sexually active and do not regularly use condoms or know the HIV status of your partner(s). ? You take drugs by injection. ? You are sexually active with a partner who has HIV. Talk with your health care provider about whether you are at high risk of being infected with HIV. If you choose to begin PrEP, you should first be tested for HIV. You should then be tested every   3 months for as long as you are taking PrEP. Pregnancy  If you are premenopausal and you may become pregnant, ask your health care provider about preconception counseling.  If you may become pregnant, take 400 to 800 micrograms (mcg) of folic acid every day.  If you want to prevent pregnancy, talk to your health care provider about birth control (contraception). Osteoporosis and menopause  Osteoporosis is a disease in which the bones lose minerals and strength with aging. This can result in serious bone fractures. Your risk for osteoporosis can be identified using a bone density scan.  If you are 65 years of age or older, or if you are at risk for osteoporosis and fractures, ask your health care provider if you should be screened.  Ask your health care provider whether you should take a calcium or vitamin D supplement to lower your risk for osteoporosis.  Menopause may have certain physical symptoms and risks.  Hormone replacement therapy may reduce some of these symptoms and risks. Talk to your health care provider about whether hormone replacement therapy is right for you. Follow these instructions at home:  Schedule regular health, dental, and eye exams.  Stay current with your immunizations.  Do not use any tobacco products including cigarettes, chewing tobacco, or electronic cigarettes.  If you are pregnant, do not drink alcohol.  If you are breastfeeding, limit how  much and how often you drink alcohol.  Limit alcohol intake to no more than 1 drink per day for nonpregnant women. One drink equals 12 ounces of beer, 5 ounces of wine, or 1 ounces of hard liquor.  Do not use street drugs.  Do not share needles.  Ask your health care provider for help if you need support or information about quitting drugs.  Tell your health care provider if you often feel depressed.  Tell your health care provider if you have ever been abused or do not feel safe at home. This information is not intended to replace advice given to you by your health care provider. Make sure you discuss any questions you have with your health care provider. Document Released: 06/03/2011 Document Revised: 04/25/2016 Document Reviewed: 08/22/2015 Elsevier Interactive Patient Education  2019 [employer]  

## 2019-01-27 NOTE — Telephone Encounter (Signed)
During AWV, patient stated that she feels her depression is getting worst. PQ9 score of 16 and denies feelings of not wanting to be here any longer and/or suicidal ideations. Patient and husband asked if PCP could increase patient's sertraline medication.

## 2019-01-27 NOTE — Progress Notes (Addendum)
Subjective:   Tamara Gregory is a 85 y.o. female who presents for Medicare Annual (Subsequent) preventive examination.  Review of Systems:  No ROS.  Medicare Wellness Visit. Additional risk factors are reflected in the social history.   Sleep patterns: has daytime sleepiness, sleeps excessively, sleep hours varies nightly.    Home Safety/Smoke Alarms: Feels safe in home. Smoke alarms in place.  Living environment; residence and Firearm Safety: 2-story house, can live on one level, equipment: Walkers, Type: Conservation officer, nature, Hydrologist, Type: Tub Surveyor, quantity and wheelchair, no firearms, firearms stored safely. Lives with husband, no needs for DME, good support system Seat Belt Safety/Bike Helmet: Wears seat belt.     Objective:     Vitals: BP 136/82   Pulse 78   Resp 17   Ht 5' (1.524 m)   Wt 146 lb (66.2 kg)   SpO2 98%   BMI 28.51 kg/m   Body mass index is 28.51 kg/m.  Advanced Directives 01/27/2019 03/17/2018 03/13/2018 03/04/2018 03/04/2018 02/13/2018 11/19/2017  Does Patient Have a Medical Advance Directive? Yes Yes Yes Yes Yes Yes Yes  Type of Paramedic of [place];Living will [place];Living will [place];Living will [place];Living will - [place];Living will [place];Living will  Does patient want to make changes to medical advance directive? - No - Patient declined No - Patient declined - - - -  Copy of [place] in Chart? No - copy requested No - copy requested No - copy requested - - - No - copy requested  Pre-existing out of facility DNR order (yellow form or pink MOST form) - - - - - - -    Tobacco Social History   Tobacco Use  Smoking Status Never Smoker  Smokeless Tobacco Never Used     Counseling given: Not Answered  Past Medical History:  Diagnosis Date  . Anemia   . Anxiety   . Arthritis   . Cancer (HCC)    hx of  skin cancer , colon   . Cataract    removed both eyes   . Chronic back pain    okay with sitting, increased back pain with activity   . Depression   . GERD (gastroesophageal reflux disease)   . Glaucoma   . Headache(784.0)    hx of  . History of kidney stones   . History of migraines   . Hyperlipidemia   . Hypertension   . Macular degeneration   . Myasthenia gravis ([place])   . Occasional tremors   . Pneumonia   . Spinal stenosis   . Thoracic aneurysm    4.4cm; see CT done 01/28/12 and 01/22/13 in [facility].   [name] Uses hearing aid   . Vitamin D deficiency    Past Surgical History:  Procedure Laterality Date  . ABDOMINAL HYSTERECTOMY    . CHOLECYSTECTOMY    . COLON SURGERY     Right colectomy dr. gross 03-17-18  . LUMBAR LAMINECTOMY/DECOMPRESSION MICRODISCECTOMY N/A 02/04/2013   Procedure: CENTRAL DECOMPRESSION/LUMBAR LAMINECTOMY L3-L4 AND L4-L5 2 LEVELS;  Surgeon: Tobi Bastos, MD;  Location: [facility];  Service: Orthopedics;  Laterality: N/A;  . SPINAL CORD STIMULATOR IMPLANT     in right hip but not currently using because it did not help  . TONSILLECTOMY    . UPPER GASTROINTESTINAL ENDOSCOPY    . US ECHOCARDIOGRAPHY  05/18/2007   EF 55-60%   Family History  Problem Relation Age of Onset  . Heart attack Father   . Diabetes Father   . Cancer Sister   . Breast cancer Sister   . Cancer Brother   . Lung cancer Brother   . Cancer Sister   . Liver cancer Sister   . Diabetes Sister   . Colon polyps Neg Hx   . Colon cancer Neg Hx   . Esophageal cancer Neg Hx   . Rectal cancer Neg Hx   . Stomach cancer Neg Hx   . Adrenal disorder Neg Hx    Social History   Socioeconomic History  . Marital status: Married    Spouse name: Not on file  . Number of children: 3  . Years of education: Not on file  . Highest education level: Not on file  Occupational History  . Occupation: retired  Scientific laboratory technician  . Financial resource strain: Not hard at all  . Food insecurity:    Worry: Never  true    Inability: Never true  . Transportation needs:    Medical: No    Non-medical: No  Tobacco Use  . Smoking status: Never Smoker  . Smokeless tobacco: Never Used  Substance and Sexual Activity  . Alcohol use: No  . Drug use: No  . Sexual activity: Not Currently  Lifestyle  . Physical activity:    Days per week: 0 days    Minutes per session: 0 min  . Stress: Rather much  Relationships  . Social connections:    Talks on phone: More than three times a week    Gets together: Once a week    Attends religious service: 1 to 4 times per year    Active member of club or organization: Yes    Attends meetings of clubs or organizations: More than 4 times per year    Relationship status: Married  Other Topics Concern  . Not on file  Social History Narrative   Lives with husband in a one story home.  Has 2 sons.  Retired.  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  Education: college.  [employer].    Outpatient Encounter Medications as of 01/27/2019  Medication Sig  . acetaminophen (TYLENOL) 500 MG tablet Take 2 tablets (1,000 mg total) by mouth 3 (three) times daily.  [name] aspirin 81 MG chewable tablet Chew 0.5 tablets (40.5 mg total) by mouth daily.  . Calcium Carbonate (CALCIUM 500 PO) Take 500 mg by mouth daily.   . Cholecalciferol (VITAMIN D) 2000 units tablet Take 2,000 Units by mouth daily.  . fluticasone (FLONASE) 50 MCG/ACT nasal spray Place 1 spray into both nostrils at bedtime.  . Glucosamine-Chondroit-Vit C-Mn (GLUCOSAMINE 1500 COMPLEX PO) Take 1 tablet by mouth daily.   [name] latanoprost (XALATAN) 0.005 % ophthalmic solution Place 1 drop into the left eye at bedtime.  [name] losartan-hydrochlorothiazide (HYZAAR) 50-12.5 MG tablet Take 1 tablet by mouth daily.  . Multiple Vitamins-Minerals (PRESERVISION AREDS) CAPS Take 1 capsule by mouth 2 (two) times daily.   . Omega-3 Fatty Acids (FISH OIL) 435 MG CAPS Take 435 mg by mouth daily.  . predniSONE (DELTASONE) 10  MG tablet Take 2 tablets (20 mg total) by mouth daily with breakfast. (Patient taking differently: Take 15 mg by mouth daily with breakfast. )  . rosuvastatin (CRESTOR) 5 MG tablet TAKE 1 TABLET EVERY DAY IN THE EVENING  . sertraline (ZOLOFT) 100 MG tablet Take 1.5 tablets (150 mg total) by mouth at bedtime.  No facility-administered encounter medications on file as of 01/27/2019.     Activities of Daily Living In your present state of health, do you have any difficulty performing the following activities: 01/27/2019 03/17/2018  Hearing? Tempie Donning  Vision? Y Y  Difficulty concentrating or making decisions? N N  Walking or climbing stairs? Y Y  Dressing or bathing? Y N  Doing errands, shopping? Y N  Preparing Food and eating ? Y -  Using the Toilet? N -  In the past six months, have you accidently leaked urine? N -  Do you have problems with loss of bowel control? N -  Managing your Medications? Y -  Managing your Finances? Y -  Housekeeping or managing your Housekeeping? Y -  Some recent data might be hidden    Patient Care Team: Binnie Rail, MD as PCP - General (Internal Medicine) Nahser, Wonda Cheng, MD as PCP - Cardiology (Cardiology) Michael Boston, MD as Consulting Physician (General Surgery) Milus Banister, MD as Attending Physician (Gastroenterology) Nahser, Wonda Cheng, MD as Consulting Physician (Cardiology) Alda Berthold, DO as Consulting Physician (Neurology) Feliz Beam, MD as Referring Physician (Ophthalmology)    Assessment:   This is a routine wellness examination for Mikiya. Physical assessment deferred to PCP.  Exercise Activities and Dietary recommendations Current Exercise Habits: The patient does not participate in regular exercise at present(chair exercise print-outs provided), Exercise limited by: orthopedic condition(s)  Diet (meal preparation, eat out, water intake, caffeinated beverages, dairy products, fruits and vegetables): in general, a "healthy" diet  ,  on average, 2 meals per day. Reports poor appetite at times. Supplements with ensure and eats high protein yogurt.   Encouraged patient to increase daily water and healthy fluid intake.  Goals    . Patient Stated     Stay as healthy and as independent as possible       Fall Risk Fall Risk  01/27/2019 01/27/2019 11/19/2018 08/25/2018 03/06/2018  Falls in the past year? 1 0 0 Yes No  Comment - - - - -  Number falls in past yr: - 0 0 1 -  Comment - - - - -  Injury with Fall? 1 0 0 No -  Risk Factor Category  - - - - -  Risk for fall due to : Impaired balance/gait;Impaired mobility;History of fall(s) - - - -  Follow up Falls prevention discussed Falls evaluation completed Falls evaluation completed Falls evaluation completed -    Depression Screen PHQ 2/9 Scores 01/27/2019 11/19/2017 03/19/2017 05/06/2016  PHQ - 2 Score 6 5 5  0  PHQ- 9 Score 17 12 10  -     Cognitive Function MMSE - Mini Mental State Exam 01/27/2019 11/19/2017  Not completed: Refused Refused        Immunization History  Administered Date(s) Administered  . Influenza,inj,Quad PF,6+ Mos 09/29/2018  . Influenza-Unspecified 08/12/2013, 08/19/2015, 08/02/2017  . Pneumococcal Conjugate-13 11/19/2017  . Pneumococcal Polysaccharide-23 08/12/2013  . Zoster 08/19/2015   Screening Tests Health Maintenance  Topic Date Due  . TETANUS/TDAP  01/20/1944  . DEXA SCAN  08/21/2018  . COLONOSCOPY  03/05/2019  . INFLUENZA VACCINE  Completed  . PNA vac Low Risk Adult  Completed      Plan:     Reviewed health maintenance screenings with patient today and relevant education, vaccines, and/or referrals were provided.   Continue doing brain stimulating activities (puzzles, reading, adult coloring books, staying active) to keep memory sharp.   Continue to eat heart  healthy diet (full of fruits, vegetables, whole grains, lean protein, water--limit salt, fat, and sugar intake) and increase physical activity as tolerated.  I have  personally reviewed and noted the following in the patient's chart:   . Medical and social history . Use of alcohol, tobacco or illicit drugs  . Current medications and supplements . Functional ability and status . Nutritional status . Physical activity . Advanced directives . List of other physicians . Vitals . Screenings to include cognitive, depression, and falls . Referrals and appointments  In addition, I have reviewed and discussed with patient certain preventive protocols, quality metrics, and best practice recommendations. A written personalized care plan for preventive services as well as general preventive health recommendations were provided to patient.     Michiel Cowboy, RN  01/27/2019   Medical screening examination/treatment/procedure(s) were performed by non-physician practitioner and as supervising physician I was immediately available for consultation/collaboration. I agree with above. Binnie Rail, MD

## 2019-01-27 NOTE — Patient Instructions (Signed)
Reduce prednisone to 12mg  for two weeks then 10mg  daily

## 2019-01-28 NOTE — Telephone Encounter (Signed)
I strongly recommend she consider speaking to a therapist and see a psychiatrist to help [place] get her on a better medication.  Her depression is a longstanding issue and has not been ideally controlled.  We can try increasing sertraline to 200 mg daily, but I think this will help minimally if any.  I think she would benefit from seeing a psychiatrist-that she does not need to follow with them long-term.  When she is on medications that are effective I can prescribe them.

## 2019-01-28 NOTE — Telephone Encounter (Signed)
Called and spoke with patient's husband to inform that PCP states the sertraline may be increased to 200 mg daily. Nurse also explained that PCP strongly recommends for patient to start seeing psychiatrist to prescribe another anti-depressant that may treat her depression more effectively. PCP explained that patient's depression has not been well controlled with sertraline and once psychiatry could better control the depression, Dr. Quay Burow would continue to prescribe the medication for patient. Mr. Homes verbalized understanding and was appreciative for the call-back.

## 2019-02-08 ENCOUNTER — Other Ambulatory Visit: Payer: Self-pay | Admitting: *Deleted

## 2019-02-08 MED ORDER — LOSARTAN POTASSIUM-HCTZ 50-12.5 MG PO TABS: 1.0000 | ORAL_TABLET | Freq: Every day | ORAL | 3 refills | Status: DC

## 2019-02-08 MED ORDER — PREDNISONE 10 MG PO TABS: 20.0000 mg | ORAL_TABLET | Freq: Every day | ORAL | 3 refills | Status: DC

## 2019-02-17 ENCOUNTER — Telehealth: Payer: Self-pay | Admitting: *Deleted

## 2019-02-17 DIAGNOSIS — G8929 Other chronic pain: Secondary | ICD-10-CM

## 2019-02-17 DIAGNOSIS — M545 Low back pain, unspecified: Secondary | ICD-10-CM

## 2019-02-17 DIAGNOSIS — R5381 Other malaise: Secondary | ICD-10-CM

## 2019-02-17 NOTE — Telephone Encounter (Signed)
Referral ordered

## 2019-02-17 NOTE — Telephone Encounter (Signed)
Called husband back in response to VM he left nurse. Mr. Atayde shared that he is concerned about patient's mobility and strength. Patient fell this morning trying to get back to bed. Husband states that she did not hurt herself when she fell but she did not have enough strength to assist him to lift her off of the floor and it took about 20 minutes to get her back to bed.  Mr. Heffler explained that he does not feel that the patient is acutely ill and stated other than her declining strength she remains at baseline. Patient has been eating and drinking fluid. Mr. Sherburne would like to have home health physical therapy assist patient to build up her strength and mobility.

## 2019-02-24 DIAGNOSIS — Z9181 History of falling: Secondary | ICD-10-CM | POA: Diagnosis not present

## 2019-02-24 DIAGNOSIS — I119 Hypertensive heart disease without heart failure: Secondary | ICD-10-CM | POA: Diagnosis not present

## 2019-02-24 DIAGNOSIS — M48062 Spinal stenosis, lumbar region with neurogenic claudication: Secondary | ICD-10-CM | POA: Diagnosis not present

## 2019-02-24 DIAGNOSIS — M1990 Unspecified osteoarthritis, unspecified site: Secondary | ICD-10-CM | POA: Diagnosis not present

## 2019-02-24 DIAGNOSIS — M25561 Pain in right knee: Secondary | ICD-10-CM | POA: Diagnosis not present

## 2019-02-24 DIAGNOSIS — M25562 Pain in left knee: Secondary | ICD-10-CM | POA: Diagnosis not present

## 2019-02-24 DIAGNOSIS — Z7982 Long term (current) use of aspirin: Secondary | ICD-10-CM | POA: Diagnosis not present

## 2019-02-24 DIAGNOSIS — Z7952 Long term (current) use of systemic steroids: Secondary | ICD-10-CM | POA: Diagnosis not present

## 2019-02-24 DIAGNOSIS — I491 Atrial premature depolarization: Secondary | ICD-10-CM | POA: Diagnosis not present

## 2019-02-26 ENCOUNTER — Telehealth: Payer: Self-pay | Admitting: Internal Medicine

## 2019-02-26 DIAGNOSIS — M25561 Pain in right knee: Secondary | ICD-10-CM | POA: Diagnosis not present

## 2019-02-26 DIAGNOSIS — M48062 Spinal stenosis, lumbar region with neurogenic claudication: Secondary | ICD-10-CM | POA: Diagnosis not present

## 2019-02-26 DIAGNOSIS — M1990 Unspecified osteoarthritis, unspecified site: Secondary | ICD-10-CM | POA: Diagnosis not present

## 2019-02-26 DIAGNOSIS — I119 Hypertensive heart disease without heart failure: Secondary | ICD-10-CM | POA: Diagnosis not present

## 2019-02-26 DIAGNOSIS — M25562 Pain in left knee: Secondary | ICD-10-CM | POA: Diagnosis not present

## 2019-02-26 DIAGNOSIS — Z9181 History of falling: Secondary | ICD-10-CM | POA: Diagnosis not present

## 2019-02-26 DIAGNOSIS — Z7982 Long term (current) use of aspirin: Secondary | ICD-10-CM | POA: Diagnosis not present

## 2019-02-26 DIAGNOSIS — I491 Atrial premature depolarization: Secondary | ICD-10-CM | POA: Diagnosis not present

## 2019-02-26 DIAGNOSIS — Z7952 Long term (current) use of systemic steroids: Secondary | ICD-10-CM | POA: Diagnosis not present

## 2019-02-26 NOTE — Telephone Encounter (Signed)
Copied from [place] [id number]. Topic: Quick Communication - See Telephone Encounter >> Feb 26, 2019  4:03 PM Antonieta Iba C wrote: CRM for notification. See Telephone encounter for: 02/26/19.  [facility] PT w/ [facility] called in requesting vo to work with pt.   Frequency: 2 x 6 and 1x2 to work on Psychologist, sport and exercise and strength.   CB: [phone]

## 2019-03-01 DIAGNOSIS — I119 Hypertensive heart disease without heart failure: Secondary | ICD-10-CM | POA: Diagnosis not present

## 2019-03-01 DIAGNOSIS — I491 Atrial premature depolarization: Secondary | ICD-10-CM | POA: Diagnosis not present

## 2019-03-01 DIAGNOSIS — Z7982 Long term (current) use of aspirin: Secondary | ICD-10-CM | POA: Diagnosis not present

## 2019-03-01 DIAGNOSIS — Z7952 Long term (current) use of systemic steroids: Secondary | ICD-10-CM | POA: Diagnosis not present

## 2019-03-01 DIAGNOSIS — Z9181 History of falling: Secondary | ICD-10-CM | POA: Diagnosis not present

## 2019-03-01 DIAGNOSIS — M1990 Unspecified osteoarthritis, unspecified site: Secondary | ICD-10-CM | POA: Diagnosis not present

## 2019-03-01 DIAGNOSIS — M25561 Pain in right knee: Secondary | ICD-10-CM | POA: Diagnosis not present

## 2019-03-01 DIAGNOSIS — M25562 Pain in left knee: Secondary | ICD-10-CM | POA: Diagnosis not present

## 2019-03-01 DIAGNOSIS — M48062 Spinal stenosis, lumbar region with neurogenic claudication: Secondary | ICD-10-CM | POA: Diagnosis not present

## 2019-03-01 NOTE — Telephone Encounter (Signed)
Gave ok for verbal orders.  

## 2019-03-05 ENCOUNTER — Ambulatory Visit: Payer: Medicare Other | Admitting: Neurology

## 2019-03-05 DIAGNOSIS — M25562 Pain in left knee: Secondary | ICD-10-CM | POA: Diagnosis not present

## 2019-03-05 DIAGNOSIS — Z7952 Long term (current) use of systemic steroids: Secondary | ICD-10-CM | POA: Diagnosis not present

## 2019-03-05 DIAGNOSIS — M48062 Spinal stenosis, lumbar region with neurogenic claudication: Secondary | ICD-10-CM | POA: Diagnosis not present

## 2019-03-05 DIAGNOSIS — I491 Atrial premature depolarization: Secondary | ICD-10-CM | POA: Diagnosis not present

## 2019-03-05 DIAGNOSIS — Z9181 History of falling: Secondary | ICD-10-CM | POA: Diagnosis not present

## 2019-03-05 DIAGNOSIS — Z7982 Long term (current) use of aspirin: Secondary | ICD-10-CM | POA: Diagnosis not present

## 2019-03-05 DIAGNOSIS — M1990 Unspecified osteoarthritis, unspecified site: Secondary | ICD-10-CM | POA: Diagnosis not present

## 2019-03-05 DIAGNOSIS — M25561 Pain in right knee: Secondary | ICD-10-CM | POA: Diagnosis not present

## 2019-03-05 DIAGNOSIS — I119 Hypertensive heart disease without heart failure: Secondary | ICD-10-CM | POA: Diagnosis not present

## 2019-03-08 DIAGNOSIS — M25562 Pain in left knee: Secondary | ICD-10-CM | POA: Diagnosis not present

## 2019-03-08 DIAGNOSIS — I491 Atrial premature depolarization: Secondary | ICD-10-CM | POA: Diagnosis not present

## 2019-03-08 DIAGNOSIS — Z9181 History of falling: Secondary | ICD-10-CM | POA: Diagnosis not present

## 2019-03-08 DIAGNOSIS — M1990 Unspecified osteoarthritis, unspecified site: Secondary | ICD-10-CM | POA: Diagnosis not present

## 2019-03-08 DIAGNOSIS — Z7982 Long term (current) use of aspirin: Secondary | ICD-10-CM | POA: Diagnosis not present

## 2019-03-08 DIAGNOSIS — Z7952 Long term (current) use of systemic steroids: Secondary | ICD-10-CM | POA: Diagnosis not present

## 2019-03-08 DIAGNOSIS — I119 Hypertensive heart disease without heart failure: Secondary | ICD-10-CM | POA: Diagnosis not present

## 2019-03-08 DIAGNOSIS — M25561 Pain in right knee: Secondary | ICD-10-CM | POA: Diagnosis not present

## 2019-03-08 DIAGNOSIS — M48062 Spinal stenosis, lumbar region with neurogenic claudication: Secondary | ICD-10-CM | POA: Diagnosis not present

## 2019-03-10 ENCOUNTER — Ambulatory Visit: Payer: Self-pay | Admitting: Internal Medicine

## 2019-03-10 ENCOUNTER — Emergency Department (HOSPITAL_COMMUNITY): Payer: Medicare Other

## 2019-03-10 ENCOUNTER — Encounter (HOSPITAL_COMMUNITY): Payer: Self-pay

## 2019-03-10 ENCOUNTER — Telehealth: Payer: Self-pay | Admitting: Internal Medicine

## 2019-03-10 ENCOUNTER — Inpatient Hospital Stay (HOSPITAL_COMMUNITY)
Admission: EM | Admit: 2019-03-10 | Discharge: 2019-03-15 | DRG: 470 | Disposition: A | Discharge status: Rehabilitation Facility | Payer: Medicare Other | Attending: Family Medicine | Admitting: Family Medicine

## 2019-03-10 ENCOUNTER — Other Ambulatory Visit: Payer: Self-pay

## 2019-03-10 DIAGNOSIS — F05 Delirium due to known physiological condition: Secondary | ICD-10-CM | POA: Diagnosis not present

## 2019-03-10 DIAGNOSIS — G8929 Other chronic pain: Secondary | ICD-10-CM | POA: Diagnosis present

## 2019-03-10 DIAGNOSIS — Z7982 Long term (current) use of aspirin: Secondary | ICD-10-CM

## 2019-03-10 DIAGNOSIS — B9620 Unspecified Escherichia coli [E. coli] as the cause of diseases classified elsewhere: Secondary | ICD-10-CM | POA: Diagnosis not present

## 2019-03-10 DIAGNOSIS — G8918 Other acute postprocedural pain: Secondary | ICD-10-CM | POA: Diagnosis not present

## 2019-03-10 DIAGNOSIS — M160 Bilateral primary osteoarthritis of hip: Secondary | ICD-10-CM | POA: Diagnosis present

## 2019-03-10 DIAGNOSIS — Z96642 Presence of left artificial hip joint: Secondary | ICD-10-CM | POA: Diagnosis not present

## 2019-03-10 DIAGNOSIS — Z9842 Cataract extraction status, left eye: Secondary | ICD-10-CM

## 2019-03-10 DIAGNOSIS — S8011XA Contusion of right lower leg, initial encounter: Secondary | ICD-10-CM | POA: Diagnosis present

## 2019-03-10 DIAGNOSIS — D638 Anemia in other chronic diseases classified elsewhere: Secondary | ICD-10-CM | POA: Diagnosis present

## 2019-03-10 DIAGNOSIS — Z85828 Personal history of other malignant neoplasm of skin: Secondary | ICD-10-CM

## 2019-03-10 DIAGNOSIS — H3530 Unspecified macular degeneration: Secondary | ICD-10-CM | POA: Diagnosis present

## 2019-03-10 DIAGNOSIS — Y92013 Bedroom of single-family (private) house as the place of occurrence of the external cause: Secondary | ICD-10-CM | POA: Diagnosis not present

## 2019-03-10 DIAGNOSIS — Z01810 Encounter for preprocedural cardiovascular examination: Secondary | ICD-10-CM | POA: Diagnosis not present

## 2019-03-10 DIAGNOSIS — S72012A Unspecified intracapsular fracture of left femur, initial encounter for closed fracture: Secondary | ICD-10-CM | POA: Diagnosis not present

## 2019-03-10 DIAGNOSIS — S8012XA Contusion of left lower leg, initial encounter: Secondary | ICD-10-CM | POA: Diagnosis present

## 2019-03-10 DIAGNOSIS — D72828 Other elevated white blood cell count: Secondary | ICD-10-CM | POA: Diagnosis not present

## 2019-03-10 DIAGNOSIS — R000 Tachycardia, unspecified: Secondary | ICD-10-CM | POA: Diagnosis not present

## 2019-03-10 DIAGNOSIS — D509 Iron deficiency anemia, unspecified: Secondary | ICD-10-CM | POA: Diagnosis not present

## 2019-03-10 DIAGNOSIS — H409 Unspecified glaucoma: Secondary | ICD-10-CM | POA: Diagnosis present

## 2019-03-10 DIAGNOSIS — Y92012 Bathroom of single-family (private) house as the place of occurrence of the external cause: Secondary | ICD-10-CM | POA: Diagnosis not present

## 2019-03-10 DIAGNOSIS — M1612 Unilateral primary osteoarthritis, left hip: Secondary | ICD-10-CM | POA: Diagnosis not present

## 2019-03-10 DIAGNOSIS — F419 Anxiety disorder, unspecified: Secondary | ICD-10-CM | POA: Diagnosis not present

## 2019-03-10 DIAGNOSIS — Z9841 Cataract extraction status, right eye: Secondary | ICD-10-CM

## 2019-03-10 DIAGNOSIS — I712 Thoracic aortic aneurysm, without rupture: Secondary | ICD-10-CM | POA: Diagnosis not present

## 2019-03-10 DIAGNOSIS — Z87442 Personal history of urinary calculi: Secondary | ICD-10-CM

## 2019-03-10 DIAGNOSIS — M549 Dorsalgia, unspecified: Secondary | ICD-10-CM | POA: Diagnosis present

## 2019-03-10 DIAGNOSIS — E559 Vitamin D deficiency, unspecified: Secondary | ICD-10-CM | POA: Diagnosis present

## 2019-03-10 DIAGNOSIS — G43909 Migraine, unspecified, not intractable, without status migrainosus: Secondary | ICD-10-CM | POA: Diagnosis not present

## 2019-03-10 DIAGNOSIS — R110 Nausea: Secondary | ICD-10-CM | POA: Diagnosis not present

## 2019-03-10 DIAGNOSIS — S41111A Laceration without foreign body of right upper arm, initial encounter: Secondary | ICD-10-CM | POA: Diagnosis not present

## 2019-03-10 DIAGNOSIS — Z803 Family history of malignant neoplasm of breast: Secondary | ICD-10-CM

## 2019-03-10 DIAGNOSIS — D62 Acute posthemorrhagic anemia: Secondary | ICD-10-CM | POA: Diagnosis not present

## 2019-03-10 DIAGNOSIS — Z85038 Personal history of other malignant neoplasm of large intestine: Secondary | ICD-10-CM

## 2019-03-10 DIAGNOSIS — S81812A Laceration without foreign body, left lower leg, initial encounter: Secondary | ICD-10-CM | POA: Diagnosis not present

## 2019-03-10 DIAGNOSIS — S0990XA Unspecified injury of head, initial encounter: Secondary | ICD-10-CM | POA: Diagnosis not present

## 2019-03-10 DIAGNOSIS — Z90710 Acquired absence of both cervix and uterus: Secondary | ICD-10-CM

## 2019-03-10 DIAGNOSIS — K219 Gastro-esophageal reflux disease without esophagitis: Secondary | ICD-10-CM | POA: Diagnosis present

## 2019-03-10 DIAGNOSIS — W1830XA Fall on same level, unspecified, initial encounter: Secondary | ICD-10-CM | POA: Diagnosis present

## 2019-03-10 DIAGNOSIS — R9431 Abnormal electrocardiogram [ECG] [EKG]: Secondary | ICD-10-CM | POA: Diagnosis not present

## 2019-03-10 DIAGNOSIS — Z9181 History of falling: Secondary | ICD-10-CM

## 2019-03-10 DIAGNOSIS — E46 Unspecified protein-calorie malnutrition: Secondary | ICD-10-CM | POA: Diagnosis not present

## 2019-03-10 DIAGNOSIS — F329 Major depressive disorder, single episode, unspecified: Secondary | ICD-10-CM | POA: Diagnosis present

## 2019-03-10 DIAGNOSIS — Z800 Family history of malignant neoplasm of digestive organs: Secondary | ICD-10-CM

## 2019-03-10 DIAGNOSIS — R1110 Vomiting, unspecified: Secondary | ICD-10-CM | POA: Diagnosis not present

## 2019-03-10 DIAGNOSIS — I160 Hypertensive urgency: Secondary | ICD-10-CM | POA: Diagnosis present

## 2019-03-10 DIAGNOSIS — K5900 Constipation, unspecified: Secondary | ICD-10-CM | POA: Diagnosis present

## 2019-03-10 DIAGNOSIS — E785 Hyperlipidemia, unspecified: Secondary | ICD-10-CM | POA: Diagnosis present

## 2019-03-10 DIAGNOSIS — Z833 Family history of diabetes mellitus: Secondary | ICD-10-CM | POA: Diagnosis not present

## 2019-03-10 DIAGNOSIS — R0902 Hypoxemia: Secondary | ICD-10-CM | POA: Diagnosis not present

## 2019-03-10 DIAGNOSIS — S72012D Unspecified intracapsular fracture of left femur, subsequent encounter for closed fracture with routine healing: Secondary | ICD-10-CM | POA: Diagnosis not present

## 2019-03-10 DIAGNOSIS — N390 Urinary tract infection, site not specified: Secondary | ICD-10-CM | POA: Diagnosis not present

## 2019-03-10 DIAGNOSIS — I10 Essential (primary) hypertension: Secondary | ICD-10-CM | POA: Diagnosis present

## 2019-03-10 DIAGNOSIS — R739 Hyperglycemia, unspecified: Secondary | ICD-10-CM | POA: Diagnosis not present

## 2019-03-10 DIAGNOSIS — Z96649 Presence of unspecified artificial hip joint: Secondary | ICD-10-CM

## 2019-03-10 DIAGNOSIS — E876 Hypokalemia: Secondary | ICD-10-CM | POA: Diagnosis not present

## 2019-03-10 DIAGNOSIS — T380X5A Adverse effect of glucocorticoids and synthetic analogues, initial encounter: Secondary | ICD-10-CM | POA: Diagnosis not present

## 2019-03-10 DIAGNOSIS — Z8249 Family history of ischemic heart disease and other diseases of the circulatory system: Secondary | ICD-10-CM | POA: Diagnosis not present

## 2019-03-10 DIAGNOSIS — Z9682 Presence of neurostimulator: Secondary | ICD-10-CM

## 2019-03-10 DIAGNOSIS — I739 Peripheral vascular disease, unspecified: Secondary | ICD-10-CM | POA: Diagnosis present

## 2019-03-10 DIAGNOSIS — R0989 Other specified symptoms and signs involving the circulatory and respiratory systems: Secondary | ICD-10-CM | POA: Diagnosis not present

## 2019-03-10 DIAGNOSIS — S79912A Unspecified injury of left hip, initial encounter: Secondary | ICD-10-CM | POA: Diagnosis not present

## 2019-03-10 DIAGNOSIS — Z7951 Long term (current) use of inhaled steroids: Secondary | ICD-10-CM

## 2019-03-10 DIAGNOSIS — S72002A Fracture of unspecified part of neck of left femur, initial encounter for closed fracture: Secondary | ICD-10-CM

## 2019-03-10 DIAGNOSIS — G7000 Myasthenia gravis without (acute) exacerbation: Secondary | ICD-10-CM | POA: Diagnosis not present

## 2019-03-10 DIAGNOSIS — Z9049 Acquired absence of other specified parts of digestive tract: Secondary | ICD-10-CM

## 2019-03-10 DIAGNOSIS — R296 Repeated falls: Secondary | ICD-10-CM | POA: Diagnosis not present

## 2019-03-10 DIAGNOSIS — R7309 Other abnormal glucose: Secondary | ICD-10-CM | POA: Diagnosis not present

## 2019-03-10 DIAGNOSIS — M1990 Unspecified osteoarthritis, unspecified site: Secondary | ICD-10-CM | POA: Diagnosis not present

## 2019-03-10 DIAGNOSIS — R51 Headache: Secondary | ICD-10-CM | POA: Diagnosis not present

## 2019-03-10 DIAGNOSIS — S81811A Laceration without foreign body, right lower leg, initial encounter: Secondary | ICD-10-CM | POA: Diagnosis not present

## 2019-03-10 DIAGNOSIS — Z7952 Long term (current) use of systemic steroids: Secondary | ICD-10-CM

## 2019-03-10 DIAGNOSIS — Z8701 Personal history of pneumonia (recurrent): Secondary | ICD-10-CM

## 2019-03-10 DIAGNOSIS — Z79899 Other long term (current) drug therapy: Secondary | ICD-10-CM

## 2019-03-10 DIAGNOSIS — M25552 Pain in left hip: Secondary | ICD-10-CM | POA: Diagnosis not present

## 2019-03-10 DIAGNOSIS — M7989 Other specified soft tissue disorders: Secondary | ICD-10-CM | POA: Diagnosis not present

## 2019-03-10 DIAGNOSIS — S72009A Fracture of unspecified part of neck of unspecified femur, initial encounter for closed fracture: Secondary | ICD-10-CM | POA: Diagnosis present

## 2019-03-10 DIAGNOSIS — Z801 Family history of malignant neoplasm of trachea, bronchus and lung: Secondary | ICD-10-CM | POA: Diagnosis not present

## 2019-03-10 DIAGNOSIS — D72829 Elevated white blood cell count, unspecified: Secondary | ICD-10-CM | POA: Diagnosis not present

## 2019-03-10 LAB — BASIC METABOLIC PANEL
Anion gap: 9 (ref 5–15)
BUN: 25 mg/dL — ABNORMAL HIGH (ref 8–23)
CO2: 25 mmol/L (ref 22–32)
Calcium: 10.3 mg/dL (ref 8.9–10.3)
Chloride: 105 mmol/L (ref 98–111)
Creatinine, Ser: 0.74 mg/dL (ref 0.44–1.00)
GFR calc Af Amer: >60 mL/min (ref >60)
GFR calc non Af Amer: >60 mL/min (ref >60)
Glucose, Bld: 132 mg/dL — ABNORMAL HIGH (ref 70–99)
Potassium: 3.3 mmol/L — ABNORMAL LOW (ref 3.5–5.1)
Sodium: 139 mmol/L (ref 135–145)

## 2019-03-10 LAB — CBC WITH DIFFERENTIAL/PLATELET
Abs Immature Granulocytes: 0.16 10*3/uL — ABNORMAL HIGH (ref 0.00–0.07)
Basophils Absolute: 0 10*3/uL (ref 0.0–0.1)
Basophils Relative: 0 %
Eosinophils Absolute: 0 10*3/uL (ref 0.0–0.5)
Eosinophils Relative: 0 %
HCT: 34 % — ABNORMAL LOW (ref 36.0–46.0)
Hemoglobin: 10.6 g/dL — ABNORMAL LOW (ref 12.0–15.0)
Immature Granulocytes: 1 %
Lymphocytes Relative: 6 %
Lymphs Abs: 0.7 10*3/uL (ref 0.7–4.0)
MCH: 30 pg (ref 26.0–34.0)
MCHC: 31.2 g/dL (ref 30.0–36.0)
MCV: 96.3 fL (ref 80.0–100.0)
Monocytes Absolute: 2.2 10*3/uL — ABNORMAL HIGH (ref 0.1–1.0)
Monocytes Relative: 17 %
Neutro Abs: 9.5 10*3/uL — ABNORMAL HIGH (ref 1.7–7.7)
Neutrophils Relative %: 76 %
Platelets: 178 10*3/uL (ref 150–400)
RBC: 3.53 MIL/uL — ABNORMAL LOW (ref 3.87–5.11)
RDW: 16.8 % — ABNORMAL HIGH (ref 11.5–15.5)
WBC: 12.6 10*3/uL — ABNORMAL HIGH (ref 4.0–10.5)
nRBC: 0 % (ref 0.0–0.2)

## 2019-03-10 LAB — URINALYSIS, ROUTINE W REFLEX MICROSCOPIC
Bilirubin Urine: NEGATIVE
Glucose, UA: NEGATIVE mg/dL
Hgb urine dipstick: NEGATIVE
Ketones, ur: NEGATIVE mg/dL
Nitrite: POSITIVE — AB
Protein, ur: 100 mg/dL — AB
Specific Gravity, Urine: 1.033 — ABNORMAL HIGH (ref 1.005–1.030)
pH: 5 (ref 5.0–8.0)

## 2019-03-10 LAB — SURGICAL PCR SCREEN
MRSA, PCR: NEGATIVE
Staphylococcus aureus: NEGATIVE

## 2019-03-10 LAB — MAGNESIUM: Magnesium: 2 mg/dL (ref 1.7–2.4)

## 2019-03-10 MED ORDER — FLUTICASONE PROPIONATE 50 MCG/ACT NA SUSP
1.0000 | Freq: Every day | NASAL | Status: DC
  Administered 2019-03-10 – 2019-03-14 (×5): 1 via NASAL
  Filled 2019-03-10: qty 16

## 2019-03-10 MED ORDER — POTASSIUM CHLORIDE CRYS ER 20 MEQ PO TBCR: 40.0000 meq | EXTENDED_RELEASE_TABLET | Freq: Once | ORAL | Status: DC

## 2019-03-10 MED ORDER — LATANOPROST 0.005 % OP SOLN
1.0000 [drp] | Freq: Every day | OPHTHALMIC | Status: DC
  Administered 2019-03-10 – 2019-03-14 (×5): 1 [drp] via OPHTHALMIC
  Filled 2019-03-10: qty 2.5

## 2019-03-10 MED ORDER — BRIMONIDINE TARTRATE 0.2 % OP SOLN
1.0000 [drp] | Freq: Three times a day (TID) | OPHTHALMIC | Status: DC
  Administered 2019-03-10 – 2019-03-15 (×13): 1 [drp] via OPHTHALMIC
  Filled 2019-03-10 (×4): qty 5

## 2019-03-10 MED ORDER — ENOXAPARIN SODIUM 40 MG/0.4ML ~~LOC~~ SOLN
40.0000 mg | SUBCUTANEOUS | Status: DC
  Administered 2019-03-10 – 2019-03-14 (×5): 40 mg via SUBCUTANEOUS
  Filled 2019-03-10 (×5): qty 0.4

## 2019-03-10 MED ORDER — HYDRALAZINE HCL 20 MG/ML IJ SOLN
5.0000 mg | Freq: Four times a day (QID) | INTRAMUSCULAR | Status: DC | PRN
  Administered 2019-03-10 – 2019-03-11 (×2): 5 mg via INTRAVENOUS
  Filled 2019-03-10 (×2): qty 1

## 2019-03-10 MED ORDER — SODIUM CHLORIDE 0.9 % IV BOLUS
500.0000 mL | Freq: Once | INTRAVENOUS | Status: AC
  Administered 2019-03-10: 15:00:00 500 mL via INTRAVENOUS

## 2019-03-10 MED ORDER — HYDROMORPHONE HCL 1 MG/ML IJ SOLN
0.5000 mg | Freq: Once | INTRAMUSCULAR | Status: AC
  Administered 2019-03-10: 0.5 mg via INTRAVENOUS
  Filled 2019-03-10: qty 1

## 2019-03-10 MED ORDER — ACETAMINOPHEN 500 MG PO TABS
1000.0000 mg | ORAL_TABLET | Freq: Three times a day (TID) | ORAL | Status: DC
  Administered 2019-03-10: 22:00:00 1000 mg via ORAL
  Filled 2019-03-10: qty 2

## 2019-03-10 MED ORDER — SODIUM CHLORIDE 0.9 % IV SOLN
1.0000 g | INTRAVENOUS | Status: DC
  Administered 2019-03-10 – 2019-03-11 (×2): 1 g via INTRAVENOUS
  Filled 2019-03-10 (×3): qty 10

## 2019-03-10 MED ORDER — SODIUM CHLORIDE 0.9 % IV SOLN
INTRAVENOUS | Status: DC
  Administered 2019-03-10 – 2019-03-12 (×2): via INTRAVENOUS

## 2019-03-10 MED ORDER — SODIUM CHLORIDE 0.9 % IV SOLN: 1.0000 g | INTRAVENOUS | Status: DC

## 2019-03-10 MED ORDER — PREDNISONE 10 MG PO TABS
10.0000 mg | ORAL_TABLET | Freq: Every day | ORAL | Status: DC
  Administered 2019-03-12 – 2019-03-15 (×4): 10 mg via ORAL
  Filled 2019-03-10 (×4): qty 1

## 2019-03-10 MED ORDER — POTASSIUM CHLORIDE CRYS ER 20 MEQ PO TBCR
60.0000 meq | EXTENDED_RELEASE_TABLET | Freq: Once | ORAL | Status: AC
  Administered 2019-03-10: 60 meq via ORAL
  Filled 2019-03-10: qty 3

## 2019-03-10 NOTE — Telephone Encounter (Signed)
FYI

## 2019-03-10 NOTE — ED Triage Notes (Signed)
Per EMS: Pt fell 3 days ago, pt hit her head on the frame of the bed and has bruising in that area.  Pt then fell again the following day, and hit her head on the tub.  Hip pain started yesterday.  Family was unable to move her into their car to take her here.  Pt vomited this am.  Pt not on blood thinners.

## 2019-03-10 NOTE — H&P (Signed)
History and Physical    Tamara Gregory:[id number] DOB: 20-Jul-1925 DOA: 03/10/2019  PCP: Binnie Rail, MD  Patient coming from: Home  I have personally briefly reviewed patient's old medical records in [place]  Chief Complaint: left hip pain.   HPI: Tamara Gregory is a 85 y.o. female with medical history significant of colon cancer s/p resection, chronic back pain s/p lumbar laminectomy and decompression microdisectomy ,iron deficiency anemia, h/o of ocular myasthenia gravis, GERD, headaches, hypertension, hyperlipidemia, macular degeneration, memory deficits, thoracic aneurysm, was brought in for persistent left hip pain and unable to bear weight on the left side. She reports pain on the left hip. She couldn't remember how she fell and I couldn't get detailed history. On talking to her husband, he reports patient fell Sunday night and Monday night and was unable to walk. There was no fever or chills.   ED Course: on arrival to ED, she was afebrile, slightly tachycardic, hypertensive ,. Lab work revealed hypokalemia, mild leukocytosis of 12.6, hemoglobin of 10.6,. UA shows positive nitrite, many bacteria with moderate leukocytes. CT of the left hip shows Acute subcapital fracture of the left hip demonstrates mild impaction and anterior displacement of femoral neck.  Pt denies any dysuria, . Orthopedics consulted and recommended to transfer the patient to [facility] for hip repair.   She was referred to medical service for admission.   Review of Systems: As per HPI otherwise 10 point review of systems negative.    Past Medical History:  Diagnosis Date   Anemia    Anxiety    Arthritis    Cancer ([place])    hx of skin cancer , colon    Cataract    removed both eyes    Chronic back pain    okay with sitting, increased back pain with activity    Depression    GERD (gastroesophageal reflux disease)    Glaucoma    Headache(784.0)    hx of   History of kidney stones     History of migraines    Hyperlipidemia    Hypertension    Macular degeneration    Myasthenia gravis ([place])    Occasional tremors    Pneumonia    Spinal stenosis    Thoracic aneurysm    4.4cm; see CT done 01/28/12 and 01/22/13 in [facility].    Uses hearing aid    Vitamin D deficiency     Past Surgical History:  Procedure Laterality Date   ABDOMINAL HYSTERECTOMY     CHOLECYSTECTOMY     COLON SURGERY     Right colectomy dr. gross 03-17-18   LUMBAR LAMINECTOMY/DECOMPRESSION MICRODISCECTOMY N/A 02/04/2013   Procedure: CENTRAL DECOMPRESSION/LUMBAR LAMINECTOMY L3-L4 AND L4-L5 2 LEVELS;  Surgeon: Tobi Bastos, MD;  Location: [facility];  Service: Orthopedics;  Laterality: N/A;   SPINAL CORD STIMULATOR IMPLANT     in right hip but not currently using because it did not help   TONSILLECTOMY     UPPER GASTROINTESTINAL ENDOSCOPY     US ECHOCARDIOGRAPHY  05/18/2007   EF 55-60%     reports that she has never smoked. She has never used smokeless tobacco. She reports that she does not drink alcohol or use drugs.  Allergies  Allergen Reactions   Tramadol Other (See Comments)    delirium    Family History  Problem Relation Age of Onset   Heart attack Father    Diabetes Father    Cancer Sister    Breast cancer  Sister    Cancer Brother    Lung cancer Brother    Cancer Sister    Liver cancer Sister    Diabetes Sister    Colon polyps Neg Hx    Colon cancer Neg Hx    Esophageal cancer Neg Hx    Rectal cancer Neg Hx    Stomach cancer Neg Hx    Adrenal disorder Neg Hx     Family history reviewed and not pertinent.  Prior to Admission medications   Medication Sig Start Date End Date Taking? Authorizing Provider  acetaminophen (TYLENOL) 500 MG tablet Take 2 tablets (1,000 mg total) by mouth 3 (three) times daily. 03/20/18  Yes Stark Klein, MD  aspirin 81 MG chewable tablet Chew 0.5 tablets (40.5 mg total) by mouth daily. 03/20/18  Yes Stark Klein, MD   brimonidine (ALPHAGAN) 0.2 % ophthalmic solution Place 1 drop into both eyes 3 (three) times daily.    Yes [provider]  Calcium Carbonate (CALCIUM 500 PO) Take 500 mg by mouth daily.    Yes [provider]  Cholecalciferol (VITAMIN D) 125 MCG (5000 UT) CAPS Take 5,000 Units by mouth daily.    Yes [provider]  fluticasone (FLONASE) 50 MCG/ACT nasal spray Place 1 spray into both nostrils at bedtime.   Yes [provider]  Glucosamine-Chondroit-Vit C-Mn (GLUCOSAMINE 1500 COMPLEX PO) Take 1 tablet by mouth daily.    Yes [provider]  latanoprost (XALATAN) 0.005 % ophthalmic solution Place 1 drop into the left eye at bedtime. 04/13/15  Yes [provider]  losartan-hydrochlorothiazide (HYZAAR) 50-12.5 MG tablet Take 1 tablet by mouth daily. 02/08/19  Yes Burns, Claudina Lick, MD  Multiple Vitamins-Minerals (PRESERVISION AREDS) CAPS Take 1 capsule by mouth 2 (two) times daily.    Yes [provider]  Omega-3 Fatty Acids (FISH OIL) 435 MG CAPS Take 435 mg by mouth daily.   Yes [provider]  predniSONE (DELTASONE) 10 MG tablet Take 2 tablets (20 mg total) by mouth daily with breakfast. Patient taking differently: Take 10 mg by mouth daily with breakfast.  02/08/19  Yes Burns, Claudina Lick, MD  rosuvastatin (CRESTOR) 5 MG tablet TAKE 1 TABLET EVERY DAY IN THE EVENING 12/30/18  Yes Burns, Claudina Lick, MD  sertraline (ZOLOFT) 100 MG tablet Take 1.5 tablets (150 mg total) by mouth at bedtime. Patient taking differently: Take 200 mg by mouth at bedtime.  12/30/18  Yes Binnie Rail, MD    Physical Exam: Vitals:   03/10/19 1102 03/10/19 1113 03/10/19 1302 03/10/19 1400  BP: (!) 151/110  (!) 152/110 (!) 188/91  Pulse: (!) 59  (!) 106 60  Resp: 18  16 19   Temp:  98.1 F (36.7 C)    TempSrc:  Oral    SpO2: 94%  97% 100%  Weight:  68 kg    Height:  5\' 5"  (1.651 m)      Constitutional: NAD, calm, comfortable Vitals:   03/10/19 1102  03/10/19 1113 03/10/19 1302 03/10/19 1400  BP: (!) 151/110  (!) 152/110 (!) 188/91  Pulse: (!) 59  (!) 106 60  Resp: 18  16 19   Temp:  98.1 F (36.7 C)    TempSrc:  Oral    SpO2: 94%  97% 100%  Weight:  68 kg    Height:  5\' 5"  (1.651 m)     Eyes: PERRL, lids and conjunctivae normal ENMT: Mucous membranes are moist. Posterior pharynx clear of any exudate or lesions.Normal dentition.  Neck: normal, supple, no masses, no thyromegaly Respiratory: clear to auscultation bilaterally, no wheezing, Cardiovascular: tachycardic, s1s2 normal. no JVD. Abdomen: no tenderness, no masses palpated.. Bowel sounds positive.  Musculoskeletal: no pedal edema, left leg externally rotated.   Skin: no rashes, lesions, ulcers. No induration Neurologic: CN 2-12 grossly intact. Sensation intact, DTR normal. Strength 5/5 in all 4.  Psychiatric: alert and oriented to place and person only.     Labs on Admission: I have personally reviewed following labs and imaging studies  CBC: Recent Labs  Lab 03/10/19 1159  WBC 12.6*  NEUTROABS 9.5*  HGB 10.6*  HCT 34.0*  MCV 96.3  PLT 998   Basic Metabolic Panel: Recent Labs  Lab 03/10/19 1159  NA 139  K 3.3*  CL 105  CO2 25  GLUCOSE 132*  BUN 25*  CREATININE 0.74  CALCIUM 10.3   GFR: Estimated Creatinine Clearance: 40.4 mL/min (by C-G formula based on SCr of 0.74 mg/dL). Liver Function Tests: No results for input(s): AST, ALT, ALKPHOS, BILITOT, PROT, ALBUMIN in the last 168 hours. No results for input(s): LIPASE, AMYLASE in the last 168 hours. No results for input(s): AMMONIA in the last 168 hours. Coagulation Profile: No results for input(s): INR, PROTIME in the last 168 hours. Cardiac Enzymes: No results for input(s): CKTOTAL, CKMB, CKMBINDEX, TROPONINI in the last 168 hours. BNP (last 3 results) No results for input(s): PROBNP in the last 8760 hours. HbA1C: No results for input(s): HGBA1C in the last 72 hours. CBG: No results for input(s):  GLUCAP in the last 168 hours. Lipid Profile: No results for input(s): CHOL, HDL, LDLCALC, TRIG, CHOLHDL, LDLDIRECT in the last 72 hours. Thyroid Function Tests: No results for input(s): TSH, T4TOTAL, FREET4, T3FREE, THYROIDAB in the last 72 hours. Anemia Panel: No results for input(s): VITAMINB12, FOLATE, FERRITIN, TIBC, IRON, RETICCTPCT in the last 72 hours. Urine analysis:    Component Value Date/Time   COLORURINE YELLOW 03/10/2019 1531   APPEARANCEUR HAZY (A) 03/10/2019 1531   LABSPEC 1.033 (H) 03/10/2019 1531   PHURINE 5.0 03/10/2019 1531   GLUCOSEU NEGATIVE 03/10/2019 1531   HGBUR NEGATIVE 03/10/2019 1531   BILIRUBINUR NEGATIVE 03/10/2019 1531   KETONESUR NEGATIVE 03/10/2019 1531   PROTEINUR 100 (A) 03/10/2019 1531   UROBILINOGEN 0.2 11/30/2014 1830   NITRITE POSITIVE (A) 03/10/2019 1531   LEUKOCYTESUR MODERATE (A) 03/10/2019 1531    Radiological Exams on Admission: Ct Head Wo Contrast  Result Date: 03/10/2019 CLINICAL DATA:  Fall 3 days prior.  Headache. EXAM: CT HEAD WITHOUT CONTRAST TECHNIQUE: Contiguous axial images were obtained from the base of the skull through the vertex without intravenous contrast. COMPARISON:  None. FINDINGS: Brain: There is mild diffuse atrophy. There is no intracranial mass, hemorrhage, extra-axial fluid collection, or midline shift. There is patchy small vessel disease in the centra semiovale bilaterally. There is mild small vessel disease in the thalamic regions. No acute appearing infarct evident on this study. Vascular: There is no appreciable hyperdense vessel. There is calcification in each carotid siphon region. Skull: The bony calvarium appears intact. Sinuses/Orbits: There is slight mucosal thickening in each maxillary antrum. There is mild mucosal thickening in several ethmoid air cells. Orbits appear symmetric bilaterally. Other: Mastoid air cells are clear. There is debris in each external auditory canal. IMPRESSION: Mild atrophy with mild  patchy periventricular small vessel disease. No acute infarct. No mass or hemorrhage. Foci of arterial vascular calcification noted. Mucosal thickening noted in several ethmoid air cells. Probable cerumen in each external auditory  canal. Electronically Signed   By: Lowella Grip III M.D.   On: 03/10/2019 14:24   Ct Hip Left Wo Contrast  Result Date: 03/10/2019 CLINICAL DATA:  Left hip pain since a fall 3 days ago. Initial encounter. EXAM: CT OF THE LEFT HIP WITHOUT CONTRAST TECHNIQUE: Multidetector CT imaging of the left hip was performed according to the standard protocol. Multiplanar CT image reconstructions were also generated. COMPARISON:  Plain films left hip this same day. FINDINGS: Bones/Joint/Cartilage The patient has an acute subcapital fracture of the left hip. There is impaction posteriorly approximately 1.5 cm and anterior displacement of the femoral neck of 1 cm. No other fracture is identified. No focal bony lesion. Ligaments Suboptimally assessed by CT. Muscles and Tendons Intact. Soft tissues Imaged intrapelvic contents demonstrate some sigmoid diverticulosis. The patient is status post hysterectomy. IMPRESSION: Acute subcapital fracture of the left hip demonstrates mild impaction and anterior displacement of femoral neck. Electronically Signed   By: Inge Rise M.D.   On: 03/10/2019 14:50   Dg Hip Unilat W Or Wo Pelvis 2-3 Views Left  Result Date: 03/10/2019 CLINICAL DATA:  Left hip pain.  No known injury. EXAM: DG HIP (WITH OR WITHOUT PELVIS) 2-3V LEFT COMPARISON:  None. FINDINGS: There is no acute bony or joint abnormality. No avascular necrosis of the femoral heads or focal lesion. Moderate to moderately severe degenerative disease about the hips appears worse on the right. Soft tissues are unremarkable. Spinal stimulator is noted. IMPRESSION: No acute finding. Moderate to moderately severe bilateral hip osteoarthritis appears worse on the right. Electronically Signed   By: Inge Rise M.D.   On: 03/10/2019 11:54    EKG: Independently reviewed. Sinus with prolonged qt interval.   Assessment/Plan Active Problems:   Hip fracture (HCC)    Left hip fracture: as per the husband , she had mechanical fall in the bathroom on Sunday and Monday night. No dizziness or syncope noted.  Dr Percell Miller with orthopedics on board.  Due to her age and co morbidities, abnormal EKG , will request cardiology consult for pre op clearance.  Her last echocardiogram is from 2015 showed preserved LVEF without wall motion abnormalities. But as per the husband pt is mostly sedentary due to persistent back pain.     Hypertensive urgency:  Resume home meds and start her on hydralazine IV prn.    Hyperlipidemia: Resume statin.    Hypokalemia:  Replaced. Mag level is 2.    H/o ocular myasthenia gravis:  Pt is on chronic steroids, continue the same.    H/o colon cancer s/p surgical resection:    UTI: Would explain her falls.  Urine cultures ordered and IV Rocephin.    Anemia of chronic disease:  Hemoglobin stable around 10.    Prolonged QT; Replace K and Magnesium  . Keep K>4 and magnesium >2.    DVT prophylaxis: lovenox.  Code Status: full code.  Family Communication: discussed with husband over the phone. Disposition Plan: pending further work up by ortho. Consults called: orthopedics Dr Percell Miller and cardiology on epic.  Admission status: inpatient and medsurg   Hosie Poisson MD [facility] Pager [phone] If 7PM-7AM, please contact night-coverage [email or web address] Password [facility]  03/10/2019, 4:43 PM

## 2019-03-10 NOTE — Telephone Encounter (Signed)
Copied from [place] [id number]. Topic: General - Inquiry >> Mar 10, 2019 11:17 AM Richardo Priest, NT wrote: Reason for CRM: Patient's physical therapist called and stated that the patient is being hospitalized due to falling and injuring herself. PT will be held and they wanted to inform the office.

## 2019-03-10 NOTE — ED Notes (Signed)
[facility] called for transport. 

## 2019-03-10 NOTE — ED Notes (Signed)
Pt has pure wick in place for UA

## 2019-03-10 NOTE — Telephone Encounter (Signed)
Tamara Gregory also spoke with [name] today regarding this.  Eval at [facility] ED.

## 2019-03-10 NOTE — ED Notes (Signed)
No urine noted at this time

## 2019-03-10 NOTE — ED Provider Notes (Signed)
[facility] DEPT Provider Note   CSN: [id number] Arrival date & time: 03/10/19  1030    History   Chief Complaint No chief complaint on file.   HPI Tamara Gregory is a 85 y.o. female.     HPI   80yF presenting after falls. She says she has actually fallen twice in the past three days. She is not sure how/why. Denies preceding symptoms. No loss of consciousness. When she fell yesterday she was trying to use the bathroom. She has had severe and persistent L hip pain since. Cannot bear weight 2/2 severe pain. She did hit head her. Mild HA. Vomited once this morning but currently no nausea, visual changes or acute neurologic complaint. No blood thinners.   Past Medical History:  Diagnosis Date   Anemia    Anxiety    Arthritis    Cancer ([place])    hx of skin cancer , colon    Cataract    removed both eyes    Chronic back pain    okay with sitting, increased back pain with activity    Depression    GERD (gastroesophageal reflux disease)    Glaucoma    Headache(784.0)    hx of   History of kidney stones    History of migraines    Hyperlipidemia    Hypertension    Macular degeneration    Myasthenia gravis ([place])    Occasional tremors    Pneumonia    Spinal stenosis    Thoracic aneurysm    4.4cm; see CT done 01/28/12 and 01/22/13 in [facility].    Uses hearing aid    Vitamin D deficiency     Patient Active Problem List   Diagnosis Date Noted   Weakness 12/04/2018   Hypokalemia 03/18/2018   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 03/17/2018   Right ovarian cyst s/p RSO 03/17/2018   Diarrhea 12/23/2017   Large hiatal hernia 04/01/2017   Sweating profusely 03/19/2017   Hyperglycemia 03/19/2017   Squamous cell skin cancer 11/05/2016   Nosebleed 10/19/2016   Osteopenia 08/28/2016   Essential hypertension, benign 08/21/2016   Long term current use of systemic steroids 08/21/2016   GERD  (gastroesophageal reflux disease) 05/06/2016   Mixed incontinence urge and stress 05/06/2016   Low hemoglobin 09/22/2014   Insomnia 07/26/2014   Malaise and fatigue 10/04/2013   PAC (premature atrial contraction) 10/04/2013   Depression 02/15/2013   Spinal stenosis, lumbar region, with neurogenic claudication 02/04/2013   Myasthenia gravis ([place]) 03/17/2012   Pure hypercholesterolemia 09/05/2011   Benign hypertensive heart disease without heart failure 09/05/2011   Osteoarthritis 09/05/2011    Past Surgical History:  Procedure Laterality Date   ABDOMINAL HYSTERECTOMY     CHOLECYSTECTOMY     COLON SURGERY     Right colectomy dr. gross 03-17-18   LUMBAR LAMINECTOMY/DECOMPRESSION MICRODISCECTOMY N/A 02/04/2013   Procedure: CENTRAL DECOMPRESSION/LUMBAR LAMINECTOMY L3-L4 AND L4-L5 2 LEVELS;  Surgeon: Tobi Bastos, MD;  Location: [facility];  Service: Orthopedics;  Laterality: N/A;   SPINAL CORD STIMULATOR IMPLANT     in right hip but not currently using because it did not help   TONSILLECTOMY     UPPER GASTROINTESTINAL ENDOSCOPY     US ECHOCARDIOGRAPHY  05/18/2007   EF 55-60%     OB History   No obstetric history on file.      Home Medications    Prior to Admission medications   Medication Sig Start Date End Date  Taking? Authorizing Provider  acetaminophen (TYLENOL) 500 MG tablet Take 2 tablets (1,000 mg total) by mouth 3 (three) times daily. 03/20/18  Yes Stark Klein, MD  aspirin 81 MG chewable tablet Chew 0.5 tablets (40.5 mg total) by mouth daily. 03/20/18  Yes Stark Klein, MD  brimonidine (ALPHAGAN) 0.2 % ophthalmic solution Place 1 drop into both eyes 3 (three) times daily.    Yes [provider]  Calcium Carbonate (CALCIUM 500 PO) Take 500 mg by mouth daily.    Yes [provider]  Cholecalciferol (VITAMIN D) 125 MCG (5000 UT) CAPS Take 5,000 Units by mouth daily.    Yes [provider]  fluticasone (FLONASE) 50 MCG/ACT nasal  spray Place 1 spray into both nostrils at bedtime.   Yes [provider]  Glucosamine-Chondroit-Vit C-Mn (GLUCOSAMINE 1500 COMPLEX PO) Take 1 tablet by mouth daily.    Yes [provider]  latanoprost (XALATAN) 0.005 % ophthalmic solution Place 1 drop into the left eye at bedtime. 04/13/15  Yes [provider]  losartan-hydrochlorothiazide (HYZAAR) 50-12.5 MG tablet Take 1 tablet by mouth daily. 02/08/19  Yes Burns, Claudina Lick, MD  Multiple Vitamins-Minerals (PRESERVISION AREDS) CAPS Take 1 capsule by mouth 2 (two) times daily.    Yes [provider]  Omega-3 Fatty Acids (FISH OIL) 435 MG CAPS Take 435 mg by mouth daily.   Yes [provider]  predniSONE (DELTASONE) 10 MG tablet Take 2 tablets (20 mg total) by mouth daily with breakfast. Patient taking differently: Take 10 mg by mouth daily with breakfast.  02/08/19  Yes Burns, Claudina Lick, MD  rosuvastatin (CRESTOR) 5 MG tablet TAKE 1 TABLET EVERY DAY IN THE EVENING 12/30/18  Yes Burns, Claudina Lick, MD  sertraline (ZOLOFT) 100 MG tablet Take 1.5 tablets (150 mg total) by mouth at bedtime. Patient taking differently: Take 200 mg by mouth at bedtime.  12/30/18  Yes Burns, Claudina Lick, MD    Family History Family History  Problem Relation Age of Onset   Heart attack Father    Diabetes Father    Cancer Sister    Breast cancer Sister    Cancer Brother    Lung cancer Brother    Cancer Sister    Liver cancer Sister    Diabetes Sister    Colon polyps Neg Hx    Colon cancer Neg Hx    Esophageal cancer Neg Hx    Rectal cancer Neg Hx    Stomach cancer Neg Hx    Adrenal disorder Neg Hx     Social History Social History   Tobacco Use   Smoking status: Never Smoker   Smokeless tobacco: Never Used  Substance Use Topics   Alcohol use: No   Drug use: No     Allergies   Tramadol   Review of Systems Review of Systems  All systems reviewed and negative, other than as noted in HPI.  Physical  Exam Updated Vital Signs BP (!) 152/110 (BP Location: Left Arm)    Pulse (!) 106    Temp 98.1 F (36.7 C) (Oral)    Resp 16    Ht 5\' 5"  (1.651 m)    Wt 68 kg    SpO2 97%    BMI 24.96 kg/m   Physical Exam Vitals signs and nursing note reviewed.  Constitutional:      General: She is not in acute distress.    Appearance: She is well-developed.  HENT:     Head: Normocephalic and atraumatic.  Eyes:     General:        Right eye: No discharge.        Left eye: No discharge.     Conjunctiva/sclera: Conjunctivae normal.  Neck:     Musculoskeletal: Neck supple.  Cardiovascular:     Rate and Rhythm: Normal rate and regular rhythm.     Heart sounds: Normal heart sounds. No murmur. No friction rub. No gallop.   Pulmonary:     Effort: Pulmonary effort is normal. No respiratory distress.     Breath sounds: Normal breath sounds.  Abdominal:     General: There is no distension.     Palpations: Abdomen is soft.     Tenderness: There is no abdominal tenderness.  Musculoskeletal:     Comments: LLE externally rotated. Does not seem significantly shortened as compared to R. Severe pain with attempted ROM. Palpable DP pulse. Foot warm. Sensation intact to light touch. Can plantar/dorsiflex foot. No midline spinal tenderness. Skin tear R upper arm. No significant bony tenderness or pain with ROM of R shoulder or elbow.   Skin:    General: Skin is warm and dry.  Neurological:     Mental Status: She is alert.  Psychiatric:        Behavior: Behavior normal.        Thought Content: Thought content normal.      ED Treatments / Results  Labs (all labs ordered are listed, but only abnormal results are displayed) Labs Reviewed  BASIC METABOLIC PANEL - Abnormal; Notable for the following components:      Result Value   Potassium 3.3 (*)    Glucose, Bld 132 (*)    BUN 25 (*)    All other components within normal limits  CBC WITH DIFFERENTIAL/PLATELET - Abnormal; Notable for the following  components:   WBC 12.6 (*)    RBC 3.53 (*)    Hemoglobin 10.6 (*)    HCT 34.0 (*)    RDW 16.8 (*)    Neutro Abs 9.5 (*)    Monocytes Absolute 2.2 (*)    Abs Immature Granulocytes 0.16 (*)    All other components within normal limits  URINALYSIS, ROUTINE W REFLEX MICROSCOPIC    EKG EKG Interpretation  Date/Time:  Wednesday March 10 2019 11:49:36 EDT Ventricular Rate:  121 PR Interval:    QRS Duration: 88 QT Interval:  374 QTC Calculation: 531 R Axis:   52 Text Interpretation:  Sinus tachycardia with irregular rate Borderline T abnormalities, inferior leads Prolonged QT interval Baseline wander in lead(s) V6 Confirmed by Virgel Manifold [phone]) on 03/10/2019 2:37:49 PM   Radiology Ct Head Wo Contrast  Result Date: 03/10/2019 CLINICAL DATA:  Fall 3 days prior.  Headache. EXAM: CT HEAD WITHOUT CONTRAST TECHNIQUE: Contiguous axial images were obtained from the base of the skull through the vertex without intravenous contrast. COMPARISON:  None. FINDINGS: Brain: There is mild diffuse atrophy. There is no intracranial mass, hemorrhage, extra-axial fluid collection, or midline shift. There is patchy small vessel disease in the centra semiovale bilaterally. There is mild small vessel disease in the thalamic regions. No acute appearing infarct evident on this study. Vascular: There is no appreciable hyperdense vessel. There is calcification in each carotid siphon region. Skull: The bony calvarium appears intact. Sinuses/Orbits: There is slight mucosal thickening in each maxillary antrum. There is mild mucosal thickening in several ethmoid air cells. Orbits appear symmetric bilaterally. Other: Mastoid air cells are clear. There is debris in each external  auditory canal. IMPRESSION: Mild atrophy with mild patchy periventricular small vessel disease. No acute infarct. No mass or hemorrhage. Foci of arterial vascular calcification noted. Mucosal thickening noted in several ethmoid air cells. Probable  cerumen in each external auditory canal. Electronically Signed   By: Lowella Grip III M.D.   On: 03/10/2019 14:24   Ct Hip Left Wo Contrast  Result Date: 03/10/2019 CLINICAL DATA:  Left hip pain since a fall 3 days ago. Initial encounter. EXAM: CT OF THE LEFT HIP WITHOUT CONTRAST TECHNIQUE: Multidetector CT imaging of the left hip was performed according to the standard protocol. Multiplanar CT image reconstructions were also generated. COMPARISON:  [facility] films left hip this same day. FINDINGS: Bones/Joint/Cartilage The patient has an acute subcapital fracture of the left hip. There is impaction posteriorly approximately 1.5 cm and anterior displacement of the femoral neck of 1 cm. No other fracture is identified. No focal bony lesion. Ligaments Suboptimally assessed by CT. Muscles and Tendons Intact. Soft tissues Imaged intrapelvic contents demonstrate some sigmoid diverticulosis. The patient is status post hysterectomy. IMPRESSION: Acute subcapital fracture of the left hip demonstrates mild impaction and anterior displacement of femoral neck. Electronically Signed   By: Inge Rise M.D.   On: 03/10/2019 14:50   Dg Hip Unilat W Or Wo Pelvis 2-3 Views Left  Result Date: 03/10/2019 CLINICAL DATA:  Left hip pain.  No known injury. EXAM: DG HIP (WITH OR WITHOUT PELVIS) 2-3V LEFT COMPARISON:  None. FINDINGS: There is no acute bony or joint abnormality. No avascular necrosis of the femoral heads or focal lesion. Moderate to moderately severe degenerative disease about the hips appears worse on the right. Soft tissues are unremarkable. Spinal stimulator is noted. IMPRESSION: No acute finding. Moderate to moderately severe bilateral hip osteoarthritis appears worse on the right. Electronically Signed   By: Inge Rise M.D.   On: 03/10/2019 11:54    Procedures Procedures (including critical care time)  Medications Ordered in ED Medications  HYDROmorphone (DILAUDID) injection 0.5 mg (0.5 mg  Intravenous Given 03/10/19 1200)  sodium chloride 0.9 % bolus 500 mL (500 mLs Intravenous New Bag/Given 03/10/19 1431)     Initial Impression / Assessment and Plan / ED Course  I have reviewed the triage vital signs and the nursing notes.  Pertinent labs & imaging results that were available during my care of the patient were reviewed by me and considered in my medical decision making (see chart for details).  89yF with L hip pain after fall. Plain films negative. Given high degree of suspicion, CT hip done. Unfortunately this did show an acute fracture. CT head w/o acute abnormality. Discussed with patient and updated her husband. Orthopedic surgery consult. Medicine admission.   3:35 PM Discussed with Dr Percell Miller, orthopedic surgery. Requesting transfer to [facility] and pt be NPO after midnight tonight.   Final Clinical Impressions(s) / ED Diagnoses   Final diagnoses:  Closed fracture of left hip, initial encounter [facility])    ED Discharge Orders    None       Virgel Manifold, MD 03/10/19 1539

## 2019-03-10 NOTE — Telephone Encounter (Signed)
Husband reports pt. Fell at home Monday and Tuesday night. Hit the back of her head both times. No LOC. Pt. Currently alert and oriented, but has a "severe headache."Also severe pain to left hip. "Can't put weight on it." Has several skin tears per husband. Will go to ED for evaluation.  Reason for Disposition . Sounds like a serious injury to the triager  Answer Assessment - Initial Assessment Questions 1. MECHANISM: "How did the injury happen?" For falls, ask: "What height did you fall from?" and "What surface did you fall against?"      Fell 2 nights ago at home.Hit the back of her head twice. 2. ONSET: "When did the injury happen?" (Minutes or hours ago)      2 nights ago - Monday 3. NEUROLOGIC SYMPTOMS: "Was there any loss of consciousness?" "Are there any other neurological symptoms?"      No - does have a headache 4. MENTAL STATUS: "Does the person know who he is, who you are, and where he is?"      Alert and oriented 5. LOCATION: "What part of the head was hit?"      Back 6. SCALP APPEARANCE: "What does the scalp look like? Is it bleeding now?" If so, ask: "Is it difficult to stop?"      No laceration - does have a bump 7. SIZE: For cuts, bruises, or swelling, ask: "How large is it?" (e.g., inches or centimeters)      No laceration to head 8. PAIN: "Is there any pain?" If so, ask: "How bad is it?"  (e.g., Scale 1-10; or mild, moderate, severe)     5 9. TETANUS: For any breaks in the skin, ask: "When was the last tetanus booster?"     Unsure 10. OTHER SYMPTOMS: "Do you have any other symptoms?" (e.g., neck pain, vomiting)       Severe left hip pain 11. PREGNANCY: "Is there any chance you are pregnant?" "When was your last menstrual period?"       no  Protocols used: HEAD INJURY-A-AH

## 2019-03-10 NOTE — ED Notes (Signed)
No urine noted in pure wick at this time  

## 2019-03-10 NOTE — ED Notes (Signed)
Bed: TJ03 Expected date:  Expected time:  Means of arrival:  Comments: EMS/fall/hip pain

## 2019-03-10 NOTE — ED Notes (Signed)
Pt awake from nap after pain medicine.  Pt very confused.  Pt awake and alert, but not oriented to time and place.

## 2019-03-10 NOTE — Telephone Encounter (Signed)
Called patient's husband in response to a VM he left nurse. Husband explained that patient has fallen x2 in the last few days. Last fall was on Monday. As a result she has severe hip pain and cannot put any weight on the leg. She also is having headaches from hitting her head. Husband denies that she is having any confusion at this time but she is in pain. Nurse discussed the situation with Dr. Quay Burow and Mr. Winiarski was instructed to take patient to the ED where she would be able to receive CT scans and x-rays and be fully  evaluated by a medical provider. Mr. Dearcos verbalized understanding and stated that his son was there to assist him to get the patient to the ED, they were going to leave right away.

## 2019-03-11 ENCOUNTER — Inpatient Hospital Stay (HOSPITAL_COMMUNITY): Payer: Medicare Other | Admitting: Anesthesiology

## 2019-03-11 ENCOUNTER — Inpatient Hospital Stay (HOSPITAL_COMMUNITY): Payer: Medicare Other

## 2019-03-11 ENCOUNTER — Encounter (HOSPITAL_COMMUNITY): Payer: Self-pay

## 2019-03-11 ENCOUNTER — Encounter (HOSPITAL_COMMUNITY)
Admission: EM | Disposition: A | Discharge status: Rehabilitation Facility | Payer: Self-pay | Source: Home / Self Care | Attending: Family Medicine

## 2019-03-11 DIAGNOSIS — S72002A Fracture of unspecified part of neck of left femur, initial encounter for closed fracture: Secondary | ICD-10-CM | POA: Diagnosis not present

## 2019-03-11 DIAGNOSIS — Z01810 Encounter for preprocedural cardiovascular examination: Secondary | ICD-10-CM

## 2019-03-11 HISTORY — PX: HIP ARTHROPLASTY: SHX981

## 2019-03-11 LAB — COMPREHENSIVE METABOLIC PANEL
ALT: 22 U/L (ref 0–44)
AST: 25 U/L (ref 15–41)
Albumin: 3.3 g/dL — ABNORMAL LOW (ref 3.5–5.0)
Alkaline Phosphatase: 51 U/L (ref 38–126)
Anion gap: 12 (ref 5–15)
BUN: 15 mg/dL (ref 8–23)
CO2: 23 mmol/L (ref 22–32)
Calcium: 8.9 mg/dL (ref 8.9–10.3)
Chloride: 106 mmol/L (ref 98–111)
Creatinine, Ser: 0.54 mg/dL (ref 0.44–1.00)
GFR calc Af Amer: >60 mL/min (ref >60)
GFR calc non Af Amer: >60 mL/min (ref >60)
Glucose, Bld: 118 mg/dL — ABNORMAL HIGH (ref 70–99)
Potassium: 4 mmol/L (ref 3.5–5.1)
Sodium: 141 mmol/L (ref 135–145)
Total Bilirubin: 0.7 mg/dL (ref 0.3–1.2)
Total Protein: 5.7 g/dL — ABNORMAL LOW (ref 6.5–8.1)

## 2019-03-11 LAB — CBC
HCT: 32.2 % — ABNORMAL LOW (ref 36.0–46.0)
Hemoglobin: 9.6 g/dL — ABNORMAL LOW (ref 12.0–15.0)
MCH: 28.3 pg (ref 26.0–34.0)
MCHC: 29.8 g/dL — ABNORMAL LOW (ref 30.0–36.0)
MCV: 95 fL (ref 80.0–100.0)
Platelets: 164 10*3/uL (ref 150–400)
RBC: 3.39 MIL/uL — ABNORMAL LOW (ref 3.87–5.11)
RDW: 16.7 % — ABNORMAL HIGH (ref 11.5–15.5)
WBC: 9.2 10*3/uL (ref 4.0–10.5)
nRBC: 0 % (ref 0.0–0.2)

## 2019-03-11 SURGERY — HEMIARTHROPLASTY, HIP, DIRECT ANTERIOR APPROACH, FOR FRACTURE
Anesthesia: General | Site: Hip | Laterality: Left

## 2019-03-11 MED ORDER — ONDANSETRON HCL 4 MG/2ML IJ SOLN: 4.0000 mg | Freq: Four times a day (QID) | INTRAMUSCULAR | Status: DC | PRN

## 2019-03-11 MED ORDER — POVIDONE-IODINE 10 % EX SWAB: 2.0000 "application " | Freq: Once | CUTANEOUS | Status: DC

## 2019-03-11 MED ORDER — ONDANSETRON HCL 4 MG/2ML IJ SOLN
INTRAMUSCULAR | Status: DC | PRN
  Administered 2019-03-11: 4 mg via INTRAVENOUS

## 2019-03-11 MED ORDER — BISACODYL 10 MG RE SUPP: 10.0000 mg | Freq: Every day | RECTAL | Status: DC | PRN

## 2019-03-11 MED ORDER — MORPHINE SULFATE (PF) 2 MG/ML IV SOLN: 1.0000 mg | INTRAVENOUS | Status: DC | PRN

## 2019-03-11 MED ORDER — ARTIFICIAL TEARS OPHTHALMIC OINT
TOPICAL_OINTMENT | OPHTHALMIC | Status: AC
  Filled 2019-03-11: qty 3.5

## 2019-03-11 MED ORDER — FLEET ENEMA 7-19 GM/118ML RE ENEM: 1.0000 | ENEMA | Freq: Once | RECTAL | Status: DC | PRN

## 2019-03-11 MED ORDER — SUCCINYLCHOLINE CHLORIDE 200 MG/10ML IV SOSY
PREFILLED_SYRINGE | INTRAVENOUS | Status: AC
  Filled 2019-03-11: qty 10

## 2019-03-11 MED ORDER — SENNA 8.6 MG PO TABS
1.0000 | ORAL_TABLET | Freq: Two times a day (BID) | ORAL | Status: DC
  Administered 2019-03-11 – 2019-03-14 (×6): 8.6 mg via ORAL
  Filled 2019-03-11 (×6): qty 1

## 2019-03-11 MED ORDER — 0.9 % SODIUM CHLORIDE (POUR BTL) OPTIME
TOPICAL | Status: DC | PRN
  Administered 2019-03-11: 1000 mL

## 2019-03-11 MED ORDER — CEFAZOLIN SODIUM-DEXTROSE 2-4 GM/100ML-% IV SOLN
2.0000 g | INTRAVENOUS | Status: AC
  Administered 2019-03-11: 12:00:00 2 g via INTRAVENOUS
  Filled 2019-03-11: qty 100

## 2019-03-11 MED ORDER — ACETAMINOPHEN 325 MG PO TABS
650.0000 mg | ORAL_TABLET | Freq: Four times a day (QID) | ORAL | Status: DC
  Administered 2019-03-11 – 2019-03-15 (×15): 650 mg via ORAL
  Filled 2019-03-11 (×15): qty 2

## 2019-03-11 MED ORDER — MIDAZOLAM HCL 2 MG/2ML IJ SOLN
INTRAMUSCULAR | Status: AC
  Filled 2019-03-11: qty 2

## 2019-03-11 MED ORDER — HYDROCODONE-ACETAMINOPHEN 5-325 MG PO TABS
1.0000 | ORAL_TABLET | Freq: Four times a day (QID) | ORAL | Status: DC | PRN
  Administered 2019-03-11 – 2019-03-15 (×3): 1 via ORAL
  Filled 2019-03-11 (×3): qty 1

## 2019-03-11 MED ORDER — ENOXAPARIN SODIUM 40 MG/0.4ML ~~LOC~~ SOLN: 40.0000 mg | SUBCUTANEOUS | 0 refills | Status: DC

## 2019-03-11 MED ORDER — LIDOCAINE 2% (20 MG/ML) 5 ML SYRINGE
INTRAMUSCULAR | Status: DC | PRN
  Administered 2019-03-11: 60 mg via INTRAVENOUS

## 2019-03-11 MED ORDER — HYDROCODONE-ACETAMINOPHEN 5-325 MG PO TABS: 1.0000 | ORAL_TABLET | Freq: Three times a day (TID) | ORAL | 0 refills | Status: DC | PRN

## 2019-03-11 MED ORDER — TRANEXAMIC ACID-NACL 1000-0.7 MG/100ML-% IV SOLN
1000.0000 mg | INTRAVENOUS | Status: AC
  Administered 2019-03-11: 1000 mg via INTRAVENOUS
  Filled 2019-03-11: qty 100

## 2019-03-11 MED ORDER — SODIUM CHLORIDE (PF) 0.9 % IJ SOLN
INTRAMUSCULAR | Status: DC | PRN
  Administered 2019-03-11 (×2): 10 mL via INTRAVENOUS

## 2019-03-11 MED ORDER — POLYETHYLENE GLYCOL 3350 17 G PO PACK: 17.0000 g | PACK | Freq: Every day | ORAL | Status: DC | PRN

## 2019-03-11 MED ORDER — HYDROCHLOROTHIAZIDE 12.5 MG PO CAPS
12.5000 mg | ORAL_CAPSULE | Freq: Every day | ORAL | Status: DC
  Administered 2019-03-11 – 2019-03-15 (×5): 12.5 mg via ORAL
  Filled 2019-03-11 (×5): qty 1

## 2019-03-11 MED ORDER — HYDROMORPHONE HCL 1 MG/ML IJ SOLN: 0.2500 mg | INTRAMUSCULAR | Status: DC | PRN

## 2019-03-11 MED ORDER — METOCLOPRAMIDE HCL 5 MG/ML IJ SOLN: 5.0000 mg | Freq: Three times a day (TID) | INTRAMUSCULAR | Status: DC | PRN

## 2019-03-11 MED ORDER — LACTATED RINGERS IV SOLN
INTRAVENOUS | Status: DC
  Administered 2019-03-11: 11:00:00 via INTRAVENOUS

## 2019-03-11 MED ORDER — ROSUVASTATIN CALCIUM 5 MG PO TABS
5.0000 mg | ORAL_TABLET | Freq: Every day | ORAL | Status: DC
  Administered 2019-03-11 – 2019-03-14 (×4): 5 mg via ORAL
  Filled 2019-03-11 (×4): qty 1

## 2019-03-11 MED ORDER — HYDROMORPHONE HCL 1 MG/ML IJ SOLN: 0.5000 mg | INTRAMUSCULAR | Status: DC | PRN

## 2019-03-11 MED ORDER — CHLORHEXIDINE GLUCONATE 4 % EX LIQD
60.0000 mL | Freq: Once | CUTANEOUS | Status: AC
  Administered 2019-03-11: 4 via TOPICAL
  Filled 2019-03-11: qty 60

## 2019-03-11 MED ORDER — ONDANSETRON HCL 4 MG/2ML IJ SOLN
INTRAMUSCULAR | Status: AC
  Filled 2019-03-11: qty 2

## 2019-03-11 MED ORDER — FENTANYL CITRATE (PF) 250 MCG/5ML IJ SOLN
INTRAMUSCULAR | Status: AC
  Filled 2019-03-11: qty 5

## 2019-03-11 MED ORDER — ONDANSETRON HCL 4 MG PO TABS
4.0000 mg | ORAL_TABLET | Freq: Four times a day (QID) | ORAL | Status: DC | PRN
  Administered 2019-03-15: 11:00:00 4 mg via ORAL
  Filled 2019-03-11: qty 1

## 2019-03-11 MED ORDER — LABETALOL HCL 5 MG/ML IV SOLN
INTRAVENOUS | Status: AC
  Filled 2019-03-11: qty 4

## 2019-03-11 MED ORDER — CEFAZOLIN SODIUM-DEXTROSE 2-4 GM/100ML-% IV SOLN
2.0000 g | Freq: Once | INTRAVENOUS | Status: AC
  Administered 2019-03-11: 20:00:00 2 g via INTRAVENOUS
  Filled 2019-03-11: qty 100

## 2019-03-11 MED ORDER — PROMETHAZINE HCL 25 MG/ML IJ SOLN: 6.2500 mg | INTRAMUSCULAR | Status: DC | PRN

## 2019-03-11 MED ORDER — PROPOFOL 10 MG/ML IV BOLUS
INTRAVENOUS | Status: DC | PRN
  Administered 2019-03-11: 50 mg via INTRAVENOUS
  Administered 2019-03-11: 20 mg via INTRAVENOUS

## 2019-03-11 MED ORDER — SODIUM CHLORIDE 0.9 % IV SOLN
INTRAVENOUS | Status: DC | PRN
  Administered 2019-03-11: 12:00:00 25 ug/min via INTRAVENOUS

## 2019-03-11 MED ORDER — DEXAMETHASONE SODIUM PHOSPHATE 10 MG/ML IJ SOLN
INTRAMUSCULAR | Status: AC
  Filled 2019-03-11: qty 1

## 2019-03-11 MED ORDER — PROPOFOL 10 MG/ML IV BOLUS
INTRAVENOUS | Status: AC
  Filled 2019-03-11: qty 20

## 2019-03-11 MED ORDER — LIDOCAINE 2% (20 MG/ML) 5 ML SYRINGE
INTRAMUSCULAR | Status: AC
  Filled 2019-03-11: qty 5

## 2019-03-11 MED ORDER — BUPIVACAINE LIPOSOME 1.3 % IJ SUSP
20.0000 mL | INTRAMUSCULAR | Status: AC
  Administered 2019-03-11: 10 mL
  Filled 2019-03-11: qty 20

## 2019-03-11 MED ORDER — POLYETHYLENE GLYCOL 3350 17 G PO PACK
17.0000 g | PACK | Freq: Two times a day (BID) | ORAL | Status: DC
  Administered 2019-03-11 – 2019-03-14 (×6): 17 g via ORAL
  Filled 2019-03-11 (×6): qty 1

## 2019-03-11 MED ORDER — ACETAMINOPHEN 325 MG PO TABS: 325.0000 mg | ORAL_TABLET | Freq: Four times a day (QID) | ORAL | Status: DC | PRN

## 2019-03-11 MED ORDER — ACETAMINOPHEN 500 MG PO TABS
1000.0000 mg | ORAL_TABLET | Freq: Once | ORAL | Status: AC
  Administered 2019-03-11: 1000 mg via ORAL
  Filled 2019-03-11: qty 2

## 2019-03-11 MED ORDER — FENTANYL CITRATE (PF) 250 MCG/5ML IJ SOLN
INTRAMUSCULAR | Status: DC | PRN
  Administered 2019-03-11 (×3): 50 ug via INTRAVENOUS

## 2019-03-11 MED ORDER — LABETALOL HCL 5 MG/ML IV SOLN
INTRAVENOUS | Status: DC | PRN
  Administered 2019-03-11 (×2): 5 mg via INTRAVENOUS

## 2019-03-11 MED ORDER — METOCLOPRAMIDE HCL 5 MG PO TABS: 5.0000 mg | ORAL_TABLET | Freq: Three times a day (TID) | ORAL | Status: DC | PRN

## 2019-03-11 MED ORDER — DEXAMETHASONE SODIUM PHOSPHATE 10 MG/ML IJ SOLN
INTRAMUSCULAR | Status: DC | PRN
  Administered 2019-03-11: 5 mg via INTRAVENOUS

## 2019-03-11 SURGICAL SUPPLY — 51 items
BIT DRILL 7/64X5 DISP (BIT) ×3 IMPLANT
BLADE SAGITTAL 25.0X1.27X90 (BLADE) ×2 IMPLANT
BLADE SAGITTAL 25.0X1.27X90MM (BLADE) ×1
CLOSURE STERI-STRIP 1/2X4 (GAUZE/BANDAGES/DRESSINGS) ×2
CLOSURE WOUND 1/2 X4 (GAUZE/BANDAGES/DRESSINGS) ×1
CLSR STERI-STRIP ANTIMIC 1/2X4 (GAUZE/BANDAGES/DRESSINGS) ×4 IMPLANT
COVER SURGICAL LIGHT HANDLE (MISCELLANEOUS) ×3 IMPLANT
COVER WAND RF STERILE (DRAPES) ×3 IMPLANT
DRAPE ORTHO SPLIT 77X108 STRL (DRAPES) ×6
DRAPE SURG ORHT 6 SPLT 77X108 (DRAPES) ×2 IMPLANT
DRAPE U-SHAPE 47X51 STRL (DRAPES) ×3 IMPLANT
DRSG MEPILEX BORDER 4X12 (GAUZE/BANDAGES/DRESSINGS) ×2 IMPLANT
DRSG MEPILEX BORDER 4X8 (GAUZE/BANDAGES/DRESSINGS) ×3 IMPLANT
DURAPREP 26ML APPLICATOR (WOUND CARE) ×3 IMPLANT
ELECT BLADE 4.0 EZ CLEAN MEGAD (MISCELLANEOUS) ×3
ELECT CAUTERY BLADE 6.4 (BLADE) ×3 IMPLANT
ELECT REM PT RETURN 9FT ADLT (ELECTROSURGICAL) ×3
ELECTRODE BLDE 4.0 EZ CLN MEGD (MISCELLANEOUS) ×1 IMPLANT
ELECTRODE REM PT RTRN 9FT ADLT (ELECTROSURGICAL) ×1 IMPLANT
FACESHIELD WRAPAROUND (MASK) ×3 IMPLANT
FACESHIELD WRAPAROUND OR TEAM (MASK) ×1 IMPLANT
GLOVE BIO SURGEON STRL SZ7.5 (GLOVE) ×6 IMPLANT
GLOVE BIOGEL PI IND STRL 8 (GLOVE) ×2 IMPLANT
GLOVE BIOGEL PI INDICATOR 8 (GLOVE) ×4
GOWN STRL REUS W/ TWL LRG LVL3 (GOWN DISPOSABLE) ×2 IMPLANT
GOWN STRL REUS W/ TWL XL LVL3 (GOWN DISPOSABLE) ×1 IMPLANT
GOWN STRL REUS W/TWL LRG LVL3 (GOWN DISPOSABLE) ×6
GOWN STRL REUS W/TWL XL LVL3 (GOWN DISPOSABLE) ×3
HEAD MODULAR ENDO (Orthopedic Implant) ×3 IMPLANT
HEAD UNPLR 46XMDLR STRL HIP (Orthopedic Implant) IMPLANT
IMMOBILIZER KNEE 22 (SOFTGOODS) ×3 IMPLANT
KIT BASIN OR (CUSTOM PROCEDURE TRAY) ×3 IMPLANT
KIT TURNOVER KIT B (KITS) ×3 IMPLANT
MANIFOLD NEPTUNE II (INSTRUMENTS) ×3 IMPLANT
NS IRRIG 1000ML POUR BTL (IV SOLUTION) ×3 IMPLANT
PACK TOTAL JOINT (CUSTOM PROCEDURE TRAY) ×3 IMPLANT
PAD ARMBOARD 7.5X6 YLW CONV (MISCELLANEOUS) ×6 IMPLANT
RETRIEVER SUT HEWSON (MISCELLANEOUS) ×3 IMPLANT
SLEEVE UNITRAX (Orthopedic Implant) ×2 IMPLANT
STEM HIP 4 127DEG (Stem) ×2 IMPLANT
STRIP CLOSURE SKIN 1/2X4 (GAUZE/BANDAGES/DRESSINGS) ×1 IMPLANT
SUT FIBERWIRE #2 38 REV NDL BL (SUTURE) ×6
SUT MNCRL AB 4-0 PS2 18 (SUTURE) ×3 IMPLANT
SUT MON AB 2-0 CT1 36 (SUTURE) ×3 IMPLANT
SUT VIC AB 1 CT1 27 (SUTURE) ×3
SUT VIC AB 1 CT1 27XBRD ANBCTR (SUTURE) ×1 IMPLANT
SUT VLOC 180 0 24IN GS25 (SUTURE) ×2 IMPLANT
SUTURE FIBERWR#2 38 REV NDL BL (SUTURE) ×2 IMPLANT
TOWEL OR 17X24 6PK STRL BLUE (TOWEL DISPOSABLE) ×3 IMPLANT
TOWEL OR 17X26 10 PK STRL BLUE (TOWEL DISPOSABLE) ×3 IMPLANT
TRAY FOLEY W/BAG SLVR 14FR (SET/KITS/TRAYS/PACK) IMPLANT

## 2019-03-11 NOTE — Social Work (Signed)
CSW acknowledging consult for SNF placement. Will follow for therapy recommendations. Prior to admission pt was active with [facility].   Westley Hummer, MSW, [place] Work [phone]

## 2019-03-11 NOTE — Progress Notes (Signed)
Orthopedic Tech Progress Note Patient Details:  Tamara Gregory 08-24-1925 [id number] Called and spoke with the patient nurse and pt has the knee emob. Patient ID: Tamara Gregory, female   DOB: [birth year], 85 y.o.   MRN: [id number]   Carolynn Comment 03/11/2019, 3:55 PM

## 2019-03-11 NOTE — Consult Note (Signed)
Cardiology Consult    Patient ID: Tamara Gregory MRN: [id number], DOB/AGE: 1924/12/19   Admit date: 03/10/2019 Date of Consult: 03/11/2019  Primary Physician: Binnie Rail, MD Primary Cardiologist: Mertie Moores, MD Requesting Provider: Hosie Poisson, MD  Patient Profile    Tamara Gregory is a 85 y.o. female with a history of hypertension, hyperlipidemia, ascending thoracic aortic aneurysm, myasthenia gravis (followed at [facility]), colon cancer s/p resection, chronic back pain s/p lumbar laminectomy and decompression microdisectomy, iron deficiency anemia, and GERD, who presented to the ED yesterday for left hip pain after suffering a fall and was found to have a left femoral neck fracture. Cardiology was consulted for pre-operative evaluation at the request of Dr. Karleen Hampshire.  History of Present Illness    Tamara Gregory is a 85 year old female with the above history who is followed by Dr. Acie Fredrickson. Patient admitted for left femoral neck fracture following a fall. Cardiology was consulted for pre-operative evaluation.  Last Echo in 2014 showed LVEF of 55-60% with moderate LVH and mild focal basal hypertrophy of the septum as well as some grade 1 diastolic dysfunction but no regional wall motion abnormalities. Last ischemic work-up was a Dobutamine Stress Echo in 2015 at [facility] in [place], [place], which showed no evidence of ischemia. Patient last saw Dr. Acie Fredrickson in 11/2017 at which time she reported some orthostasis with several falls. BP was noted to be soft at that time so home Losartan-HCTZ was decreased to 50-12.5mg  daily.  With current COVID-19 pandemic and in an effort to limit exposure, initial plan was to call into room to obtain history. However, I spoke with RN who said patient is very confused this morning and would likely have a hard time having a conversation over the phone. Patient does not have any baseline dementia but received Dilaudid yesterday and has apparently been  confused since then. Therefore, history obtained through chart review. Patient reportedly fell Sunday night and again on Monday night and has been unable to walk since then. Patient could not remember what caused her to fall but reportedly denied any dizziness or syncope beforehand. She has a history of frequent fall and poor balance so felt to be a mechanical fall. Unable to tell through chart review if she has had any other cardiac symptoms recently.   Of note, patient was also noted to be extremely hypertensive on admission with BP as high as 204/104. Patient was started on IV Hydralazine PRN. EKG this admission showed sinus arrhythmia, rate 121 bpm, with PAC but no acute ischemic changes.  Past Medical History   Past Medical History:  Diagnosis Date   Anemia    Anxiety    Arthritis    Cancer ([place])    hx of skin cancer , colon    Cataract    removed both eyes    Chronic back pain    okay with sitting, increased back pain with activity    Depression    GERD (gastroesophageal reflux disease)    Glaucoma    Headache(784.0)    hx of   History of kidney stones    History of migraines    Hyperlipidemia    Hypertension    Macular degeneration    Myasthenia gravis ([place])    Occasional tremors    Pneumonia    Spinal stenosis    Thoracic aneurysm    4.4cm; see CT done 01/28/12 and 01/22/13 in [facility].    Uses hearing aid    Vitamin  D deficiency     Past Surgical History:  Procedure Laterality Date   ABDOMINAL HYSTERECTOMY     CHOLECYSTECTOMY     COLON SURGERY     Right colectomy dr. gross 03-17-18   LUMBAR LAMINECTOMY/DECOMPRESSION MICRODISCECTOMY N/A 02/04/2013   Procedure: CENTRAL DECOMPRESSION/LUMBAR LAMINECTOMY L3-L4 AND L4-L5 2 LEVELS;  Surgeon: Tobi Bastos, MD;  Location: [facility];  Service: Orthopedics;  Laterality: N/A;   SPINAL CORD STIMULATOR IMPLANT     in right hip but not currently using because it did not help   TONSILLECTOMY     UPPER  GASTROINTESTINAL ENDOSCOPY     US ECHOCARDIOGRAPHY  05/18/2007   EF 55-60%     Allergies  Allergies  Allergen Reactions   Tramadol Other (See Comments)    delirium    Inpatient Medications     acetaminophen  1,000 mg Oral Once   acetaminophen  1,000 mg Oral TID   brimonidine  1 drop Both Eyes TID   enoxaparin (LOVENOX) injection  40 mg Subcutaneous Q24H   fluticasone  1 spray Each Nare QHS   latanoprost  1 drop Left Eye QHS   povidone-iodine  2 application Topical Once   predniSONE  10 mg Oral Q breakfast    Family History    Family History  Problem Relation Age of Onset   Heart attack Father    Diabetes Father    Cancer Sister    Breast cancer Sister    Cancer Brother    Lung cancer Brother    Cancer Sister    Liver cancer Sister    Diabetes Sister    Colon polyps Neg Hx    Colon cancer Neg Hx    Esophageal cancer Neg Hx    Rectal cancer Neg Hx    Stomach cancer Neg Hx    Adrenal disorder Neg Hx    She indicated that her mother is deceased. She indicated that her father is deceased. She indicated that all of her three sisters are deceased. She indicated that her brother is deceased. She indicated that her maternal grandmother is deceased. She indicated that her maternal grandfather is deceased. She indicated that her paternal grandmother is deceased. She indicated that her paternal grandfather is deceased. She indicated that the status of her neg hx is unknown.   Social History    Social History   Socioeconomic History   Marital status: Married    Spouse name: Not on file   Number of children: 3   Years of education: Not on file   Highest education level: Not on file  Occupational History   Occupation: retired  Scientist, product/process development strain: Not hard at [employer] insecurity:    Worry: Never true    Inability: Never true   Transportation needs:    Medical: No    Non-medical: No  Tobacco Use   Smoking  status: Never Smoker   Smokeless tobacco: Never Used  Substance and Sexual Activity   Alcohol use: No   Drug use: No   Sexual activity: Not Currently  Lifestyle   Physical activity:    Days per week: 0 days    Minutes per session: 0 min   Stress: Rather much  Relationships   Social connections:    Talks on phone: More than three times a week    Gets together: Once a week    Attends religious service: 1 to 4 times per year    Active member  of club or organization: Yes    Attends meetings of clubs or organizations: More than 4 times per year    Relationship status: Married   Intimate partner violence:    Fear of current or ex partner: No    Emotionally abused: No    Physically abused: No    Forced sexual activity: No  Other Topics Concern   Not on file  Social History Narrative   Lives with husband in a one story home.  Has 2 sons.  Retired.  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  Education: college.  [employer].     Review of Systems    Review of Systems  Unable to perform ROS: Mental status change   Please see HPI.  Physical Exam    Blood pressure 131/89, pulse 94, temperature 97.7 F (36.5 C), temperature source Oral, resp. rate 20, height 5\' 6"  (1.676 m), weight 66.9 kg, SpO2 96 %.   Due to current COVID-19 pandemic and in an effort to minimize exposure, physical exam was not performed. MD to follow.   Labs    Troponin (Point of Care Test) No results for input(s): TROPIPOC in the last 72 hours. No results for input(s): CKTOTAL, CKMB, TROPONINI in the last 72 hours. Lab Results  Component Value Date   WBC 9.2 03/11/2019   HGB 9.6 (L) 03/11/2019   HCT 32.2 (L) 03/11/2019   MCV 95.0 03/11/2019   PLT 164 03/11/2019    Recent Labs  Lab 03/11/19 0336  NA 141  K 4.0  CL 106  CO2 23  BUN 15  CREATININE 0.54  CALCIUM 8.9  PROT 5.7*  BILITOT 0.7  ALKPHOS 51  ALT 22  AST 25  GLUCOSE 118*   Lab Results  Component Value  Date   CHOL 157 11/19/2017   HDL 67.20 11/19/2017   LDLCALC 61 11/19/2017   TRIG 142.0 11/19/2017   No results found for: [facility]   Radiology Studies    Ct Head Wo Contrast  Result Date: 03/10/2019 CLINICAL DATA:  Fall 3 days prior.  Headache. EXAM: CT HEAD WITHOUT CONTRAST TECHNIQUE: Contiguous axial images were obtained from the base of the skull through the vertex without intravenous contrast. COMPARISON:  None. FINDINGS: Brain: There is mild diffuse atrophy. There is no intracranial mass, hemorrhage, extra-axial fluid collection, or midline shift. There is patchy small vessel disease in the centra semiovale bilaterally. There is mild small vessel disease in the thalamic regions. No acute appearing infarct evident on this study. Vascular: There is no appreciable hyperdense vessel. There is calcification in each carotid siphon region. Skull: The bony calvarium appears intact. Sinuses/Orbits: There is slight mucosal thickening in each maxillary antrum. There is mild mucosal thickening in several ethmoid air cells. Orbits appear symmetric bilaterally. Other: Mastoid air cells are clear. There is debris in each external auditory canal. IMPRESSION: Mild atrophy with mild patchy periventricular small vessel disease. No acute infarct. No mass or hemorrhage. Foci of arterial vascular calcification noted. Mucosal thickening noted in several ethmoid air cells. Probable cerumen in each external auditory canal. Electronically Signed   By: Lowella Grip III M.D.   On: 03/10/2019 14:24   Ct Hip Left Wo Contrast  Result Date: 03/10/2019 CLINICAL DATA:  Left hip pain since a fall 3 days ago. Initial encounter. EXAM: CT OF THE LEFT HIP WITHOUT CONTRAST TECHNIQUE: Multidetector CT imaging of the left hip was performed according to the standard protocol. Multiplanar CT image reconstructions were  also generated. COMPARISON:  Plain films left hip this same day. FINDINGS: Bones/Joint/Cartilage The patient has an acute  subcapital fracture of the left hip. There is impaction posteriorly approximately 1.5 cm and anterior displacement of the femoral neck of 1 cm. No other fracture is identified. No focal bony lesion. Ligaments Suboptimally assessed by CT. Muscles and Tendons Intact. Soft tissues Imaged intrapelvic contents demonstrate some sigmoid diverticulosis. The patient is status post hysterectomy. IMPRESSION: Acute subcapital fracture of the left hip demonstrates mild impaction and anterior displacement of femoral neck. Electronically Signed   By: Inge Rise M.D.   On: 03/10/2019 14:50   Dg Hip Unilat W Or Wo Pelvis 2-3 Views Left  Result Date: 03/10/2019 CLINICAL DATA:  Left hip pain.  No known injury. EXAM: DG HIP (WITH OR WITHOUT PELVIS) 2-3V LEFT COMPARISON:  None. FINDINGS: There is no acute bony or joint abnormality. No avascular necrosis of the femoral heads or focal lesion. Moderate to moderately severe degenerative disease about the hips appears worse on the right. Soft tissues are unremarkable. Spinal stimulator is noted. IMPRESSION: No acute finding. Moderate to moderately severe bilateral hip osteoarthritis appears worse on the right. Electronically Signed   By: Inge Rise M.D.   On: 03/10/2019 11:54    EKG     EKG: EKG was personally reviewed and demonstrates: Sinus arrhythmia, rate 121 bpm, with PAC but no acute ischemic changes.  Telemetry: Telemetry was personally reviewed and demonstrates: Does not appear that patient is on telemetry.   Cardiac Imaging    Echocardiogram 01/26/2013: Study Conclusions: - Left ventricle: The cavity size was normal. Wall thickness was increased in a pattern of moderate LVH. There was mild focal basal hypertrophy of the septum. Systolic function was normal. The estimated ejection fraction was in the range of 55% to 60%. Wall motion was normal; there were no regional wall motion abnormalities. Doppler parameters are consistent with  abnormal left ventricular relaxation (grade 1 diastolic dysfunction). - Aortic valve: Valve mobility was restricted. There was mild stenosis. Mild regurgitation. Valve area: 1.6cm^2(VTI). Valve area: 1.48cm^2 (Vmax).  Assessment & Plan    Pre-Operative Evaluation - Patient admitted for left femoral neck fracture following a suspected mechanical fall. Patient scheduled for arthroplasty later today. Cardiology was consulted for pre-operative evaluation. Spoke with RN who states patient is very confused this morning so I was unable to obtain a history from the patient.  - EKG this admission showed sinus arhythmia, rate 121 bpm, with PAC but no acute ischemic changes. - Most recent Echo in 2014 showed LVEF of 55-60% with moderate LVH and mild focal basal hypertrophy of the septum as well as some grade 1 diastolic dysfunction but no regional wall motion abnormalities. Last ischemic work-up was a Dobutamine Stress Echo in 2015 at [facility] in [place], [place], which showed no evidence of ischemia. - Revised Cardiac Risk Index considered low risk but this does not take into account the patient's advanced age. - Per chart, review patient has history of poor status and frequent falls. Activity has been limited in the past due to back pain so suspect functional status will be difficult to assess. - MD to see.   Hypertensive Urgency - Patient has been very hypertensive with BP has been as high as 204/101. Most recent BP 131/89. - Currently on IV Hydralazine PRN. - Consider restarting home Losartan-HCTZ.  Left Femoral Neck Fracture - Management per Ortho.  Otherwise, per primary team.  Signed, Darreld Mclean, PA-C 03/11/2019, 8:05  AM Pager: [phone] For questions or updates, please contact   Please consult [email or web address] for contact info under Cardiology/STEMI.

## 2019-03-11 NOTE — Anesthesia Procedure Notes (Signed)
Procedure Name: Intubation Date/Time: 03/11/2019 12:04 PM Performed by: Alain Marion, CRNA Pre-anesthesia Checklist: Patient identified, Emergency Drugs available, Suction available and Patient being monitored Patient Re-evaluated:Patient Re-evaluated prior to induction Oxygen Delivery Method: Circle System Utilized Preoxygenation: Pre-oxygenation with 100% oxygen Induction Type: IV induction Ventilation: Mask ventilation without difficulty Laryngoscope Size: Miller and 2 Grade View: Grade I Tube type: Oral Tube size: 7.0 mm Number of attempts: 1 Airway Equipment and Method: Stylet and Oral airway Placement Confirmation: ETT inserted through vocal cords under direct vision,  positive ETCO2 and breath sounds checked- equal and bilateral Secured at: 21 cm Tube secured with: Tape Dental Injury: Teeth and Oropharynx as per pre-operative assessment

## 2019-03-11 NOTE — Interval H&P Note (Signed)
I participated in the care of this patient and agree with the above history, physical and evaluation. I performed a review of the history and a physical exam as detailed   Nitesh Pitstick Daniel Keiden Deskin MD  

## 2019-03-11 NOTE — Anesthesia Postprocedure Evaluation (Signed)
Anesthesia Post Note  Patient: Tamara Gregory  Procedure(s) Performed: ARTHROPLASTY BIPOLAR HIP (HEMIARTHROPLASTY) (Left Hip)     Patient location during evaluation: PACU Anesthesia Type: General Level of consciousness: awake and alert Pain management: pain level controlled Vital Signs Assessment: post-procedure vital signs reviewed and stable Respiratory status: spontaneous breathing, nonlabored ventilation, respiratory function stable and patient connected to nasal cannula oxygen Cardiovascular status: blood pressure returned to baseline and stable Postop Assessment: no apparent nausea or vomiting Anesthetic complications: no    Last Vitals:  Vitals:   03/11/19 1434 03/11/19 1445  BP: 137/71   Pulse: 72 76  Resp: 17 17  Temp:    SpO2: 98% 96%    Last Pain:  Vitals:   03/11/19 1445  TempSrc:   PainSc: 0-No pain                 Patryce Depriest S

## 2019-03-11 NOTE — Op Note (Signed)
03/10/2019 - 03/11/2019  12:48 PM  PATIENT:  Tamara Gregory   MRN: [id number]  PRE-OPERATIVE DIAGNOSIS:  left hip fracture  POST-OPERATIVE DIAGNOSIS:  left hip fracture  PROCEDURE:  Procedure(s): ARTHROPLASTY BIPOLAR HIP (HEMIARTHROPLASTY)  PREOPERATIVE INDICATIONS:  Tamara Gregory is an 85 y.o. female who was admitted 03/10/2019 with a diagnosis of Left displaced femoral neck fracture ([place]) and elected for surgical management.  The risks benefits and alternatives were discussed with the patient including but not limited to the risks of nonoperative treatment, versus surgical intervention including infection, bleeding, nerve injury, periprosthetic fracture, the need for revision surgery, dislocation, leg length discrepancy, blood clots, cardiopulmonary complications, morbidity, mortality, among others, and they were willing to proceed.  Predicted outcome is good, although there will be at least a six to nine month expected recovery.   OPERATIVE REPORT     SURGEON:  Edmonia Lynch, MD    ASSISTANT:  Roxan Hockey, PA-C, he was present and scrubbed throughout the case, critical for completion in a timely fashion, and for retraction, instrumentation, and closure.     ANESTHESIA:  General    COMPLICATIONS:  None.      COMPONENTS:  [employer] Acolade: Femoral stem: 4, Femoral Head:46, Neck:-4   PROCEDURE IN DETAIL: The patient was met in the holding area and identified.  The appropriate hip  was marked at the operative site. The patient was then transported to the OR and  placed under general anesthesia.  At that point, the patient was  placed in the lateral decubitus position with the operative side up and  secured to the operating room table and all bony prominences padded.     The operative lower extremity was prepped from the iliac crest to the toes.  Sterile draping was performed.  Time out was performed prior to incision.      A routine posterolateral approach was utilized via sharp  dissection  carried down to the subcutaneous tissue.  Gross bleeders were Bovie  coagulated.  The iliotibial band was identified and incised  along the length of the skin incision.  Self-retaining retractors were  inserted. I examined the bursa there was significant hematoma and edema I performed a bursectomy here.  With the hip internally rotated, the short external rotators  were identified. The piriformis was tagged with FiberWire, and the hip capsule released in a T-type fashion.  The femoral neck was exposed, and I resected the femoral neck using the appropriate jig. This was performed at approximately a thumb's breadth above the lesser trochanter.    I then exposed the deep acetabulum, cleared out any tissue including the ligamentum teres, and included the hip capsule in the FiberWire used above and below the T.    I then prepared the proximal femur using the cookie-cutter, the lateralizing reamer, and then sequentially broached.  A trial utilized, and I reduced the hip and it was found to have excellent stability with functional range of motion. The trial components were then removed.   The canal and acetabulum were thoroughly irrigated  I inserted the pressfit stem and placed the head and neck collar. The hip was reduced with appropriate force and was stable through a range of motion.   I then used a 2 mm drill bits to pass the FiberWire suture from the capsule and puriform is through the greater trochanter, and secured this. Excellent posterior capsular repair was achieved. I also closed the T in the capsule.  I then irrigated the hip copiously again  with pulse lavage, and repaired the fascia with Vicryl, followed by Vicryl for the subcutaneous tissue, Monocryl for the skin, Steri-Strips and sterile gauze. The wounds were injected. The patient was then awakened and returned to PACU in stable and satisfactory condition. There were no complications.  POST-OP PLAN: Weight bearing as  tolerated. DVT px will consist of SCD's and chemical px  Edmonia Lynch, MD Orthopedic Surgeon [phone]   03/11/2019 12:48 PM

## 2019-03-11 NOTE — H&P (View-Only) (Signed)
ORTHOPAEDIC CONSULTATION  REQUESTING PHYSICIAN: Elodia Florence., *  Chief Complaint: Left hip pain, fall  HPI: Tamara Gregory is a 85 y.o. female who complains of left hip pain and inability to bear weight.  She presented to the emergency department at [facility] long chloric images showed a displaced left femoral neck fracture.  Orthopedics was consulted for evaluation and she was transferred to [facility] for surgical treatment and medical clearance.  She denies history of MI, CVA, DVT, PE.  Previously ambulatory with the aid of a cane or walker.    Past Medical History:  Diagnosis Date   Anemia    Anxiety    Arthritis    Cancer ([place])    hx of skin cancer , colon    Cataract    removed both eyes    Chronic back pain    okay with sitting, increased back pain with activity    Depression    GERD (gastroesophageal reflux disease)    Glaucoma    Headache(784.0)    hx of   History of kidney stones    History of migraines    Hyperlipidemia    Hypertension    Macular degeneration    Myasthenia gravis ([place])    Occasional tremors    Pneumonia    Spinal stenosis    Thoracic aneurysm    4.4cm; see CT done 01/28/12 and 01/22/13 in [facility].    Uses hearing aid    Vitamin D deficiency    Past Surgical History:  Procedure Laterality Date   ABDOMINAL HYSTERECTOMY     CHOLECYSTECTOMY     COLON SURGERY     Right colectomy dr. gross 03-17-18   LUMBAR LAMINECTOMY/DECOMPRESSION MICRODISCECTOMY N/A 02/04/2013   Procedure: CENTRAL DECOMPRESSION/LUMBAR LAMINECTOMY L3-L4 AND L4-L5 2 LEVELS;  Surgeon: Tobi Bastos, MD;  Location: [facility];  Service: Orthopedics;  Laterality: N/A;   SPINAL CORD STIMULATOR IMPLANT     in right hip but not currently using because it did not help   TONSILLECTOMY     UPPER GASTROINTESTINAL ENDOSCOPY     US ECHOCARDIOGRAPHY  05/18/2007   EF 55-60%   Social History   Socioeconomic History   Marital status: Married     Spouse name: Not on file   Number of children: 3   Years of education: Not on file   Highest education level: Not on file  Occupational History   Occupation: retired  Scientist, product/process development strain: Not hard at [employer] insecurity:    Worry: Never true    Inability: Never true   Transportation needs:    Medical: No    Non-medical: No  Tobacco Use   Smoking status: Never Smoker   Smokeless tobacco: Never Used  Substance and Sexual Activity   Alcohol use: No   Drug use: No   Sexual activity: Not Currently  Lifestyle   Physical activity:    Days per week: 0 days    Minutes per session: 0 min   Stress: Rather much  Relationships   Social connections:    Talks on phone: More than three times a week    Gets together: Once a week    Attends religious service: 1 to 4 times per year    Active member of club or organization: Yes    Attends meetings of clubs or organizations: More than 4 times per year    Relationship status: Married  Other Topics Concern  Not on file  Social History Narrative   Lives with husband in a one story home.  Has 2 sons.  Retired.  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  Education: college.  [employer].   Family History  Problem Relation Age of Onset   Heart attack Father    Diabetes Father    Cancer Sister    Breast cancer Sister    Cancer Brother    Lung cancer Brother    Cancer Sister    Liver cancer Sister    Diabetes Sister    Colon polyps Neg Hx    Colon cancer Neg Hx    Esophageal cancer Neg Hx    Rectal cancer Neg Hx    Stomach cancer Neg Hx    Adrenal disorder Neg Hx    Allergies  Allergen Reactions   Tramadol Other (See Comments)    delirium   Prior to Admission medications   Medication Sig Start Date End Date Taking? Authorizing Provider  acetaminophen (TYLENOL) 500 MG tablet Take 2 tablets (1,000 mg total) by mouth 3 (three) times daily. 03/20/18  Yes Stark Klein, MD  aspirin 81 MG chewable tablet Chew 0.5 tablets (40.5 mg total) by mouth daily. 03/20/18  Yes Stark Klein, MD  brimonidine (ALPHAGAN) 0.2 % ophthalmic solution Place 1 drop into both eyes 3 (three) times daily.    Yes [provider]  Calcium Carbonate (CALCIUM 500 PO) Take 500 mg by mouth daily.    Yes [provider]  Cholecalciferol (VITAMIN D) 125 MCG (5000 UT) CAPS Take 5,000 Units by mouth daily.    Yes [provider]  fluticasone (FLONASE) 50 MCG/ACT nasal spray Place 1 spray into both nostrils at bedtime.   Yes [provider]  Glucosamine-Chondroit-Vit C-Mn (GLUCOSAMINE 1500 COMPLEX PO) Take 1 tablet by mouth daily.    Yes [provider]  latanoprost (XALATAN) 0.005 % ophthalmic solution Place 1 drop into the left eye at bedtime. 04/13/15  Yes [provider]  losartan-hydrochlorothiazide (HYZAAR) 50-12.5 MG tablet Take 1 tablet by mouth daily. 02/08/19  Yes Burns, Claudina Lick, MD  Multiple Vitamins-Minerals (PRESERVISION AREDS) CAPS Take 1 capsule by mouth 2 (two) times daily.    Yes [provider]  Omega-3 Fatty Acids (FISH OIL) 435 MG CAPS Take 435 mg by mouth daily.   Yes [provider]  predniSONE (DELTASONE) 10 MG tablet Take 2 tablets (20 mg total) by mouth daily with breakfast. Patient taking differently: Take 10 mg by mouth daily with breakfast.  02/08/19  Yes Burns, Claudina Lick, MD  rosuvastatin (CRESTOR) 5 MG tablet TAKE 1 TABLET EVERY DAY IN THE EVENING 12/30/18  Yes Burns, Claudina Lick, MD  sertraline (ZOLOFT) 100 MG tablet Take 1.5 tablets (150 mg total) by mouth at bedtime. Patient taking differently: Take 200 mg by mouth at bedtime.  12/30/18  Yes Binnie Rail, MD   Ct Head Wo Contrast  Result Date: 03/10/2019 CLINICAL DATA:  Fall 3 days prior.  Headache. EXAM: CT HEAD WITHOUT CONTRAST TECHNIQUE: Contiguous axial images were obtained from the base of the skull through the vertex without intravenous  contrast. COMPARISON:  None. FINDINGS: Brain: There is mild diffuse atrophy. There is no intracranial mass, hemorrhage, extra-axial fluid collection, or midline shift. There is patchy small vessel disease in the centra semiovale bilaterally. There is mild small vessel disease in the thalamic regions. No acute appearing infarct evident on this study. Vascular: There is no appreciable  hyperdense vessel. There is calcification in each carotid siphon region. Skull: The bony calvarium appears intact. Sinuses/Orbits: There is slight mucosal thickening in each maxillary antrum. There is mild mucosal thickening in several ethmoid air cells. Orbits appear symmetric bilaterally. Other: Mastoid air cells are clear. There is debris in each external auditory canal. IMPRESSION: Mild atrophy with mild patchy periventricular small vessel disease. No acute infarct. No mass or hemorrhage. Foci of arterial vascular calcification noted. Mucosal thickening noted in several ethmoid air cells. Probable cerumen in each external auditory canal. Electronically Signed   By: Lowella Grip III M.D.   On: 03/10/2019 14:24   Ct Hip Left Wo Contrast  Result Date: 03/10/2019 CLINICAL DATA:  Left hip pain since a fall 3 days ago. Initial encounter. EXAM: CT OF THE LEFT HIP WITHOUT CONTRAST TECHNIQUE: Multidetector CT imaging of the left hip was performed according to the standard protocol. Multiplanar CT image reconstructions were also generated. COMPARISON:  Plain films left hip this same day. FINDINGS: Bones/Joint/Cartilage The patient has an acute subcapital fracture of the left hip. There is impaction posteriorly approximately 1.5 cm and anterior displacement of the femoral neck of 1 cm. No other fracture is identified. No focal bony lesion. Ligaments Suboptimally assessed by CT. Muscles and Tendons Intact. Soft tissues Imaged intrapelvic contents demonstrate some sigmoid diverticulosis. The patient is status post hysterectomy.  IMPRESSION: Acute subcapital fracture of the left hip demonstrates mild impaction and anterior displacement of femoral neck. Electronically Signed   By: Inge Rise M.D.   On: 03/10/2019 14:50   Dg Hip Unilat W Or Wo Pelvis 2-3 Views Left  Result Date: 03/10/2019 CLINICAL DATA:  Left hip pain.  No known injury. EXAM: DG HIP (WITH OR WITHOUT PELVIS) 2-3V LEFT COMPARISON:  None. FINDINGS: There is no acute bony or joint abnormality. No avascular necrosis of the femoral heads or focal lesion. Moderate to moderately severe degenerative disease about the hips appears worse on the right. Soft tissues are unremarkable. Spinal stimulator is noted. IMPRESSION: No acute finding. Moderate to moderately severe bilateral hip osteoarthritis appears worse on the right. Electronically Signed   By: Inge Rise M.D.   On: 03/10/2019 11:54    Positive ROS: All other systems have been reviewed and were otherwise negative with the exception of those mentioned in the HPI and as above.  Objective: Labs cbc Recent Labs    03/10/19 1159 03/11/19 0336  WBC 12.6* 9.2  HGB 10.6* 9.6*  HCT 34.0* 32.2*  PLT 178 164    Labs inflam No results for input(s): CRP in the last 72 hours.  Invalid input(s): ESR  Labs coag No results for input(s): INR, PTT in the last 72 hours.  Invalid input(s): PT  Recent Labs    03/10/19 1159 03/11/19 0336  NA 139 141  K 3.3* 4.0  CL 105 106  CO2 25 23  GLUCOSE 132* 118*  BUN 25* 15  CREATININE 0.74 0.54  CALCIUM 10.3 8.9    Physical Exam: Vitals:   03/11/19 0524 03/11/19 0605  BP: (!) 204/104 (!) 162/90  Pulse: 76 85  Resp: 20   Temp:    SpO2:     General: Alert, no acute distress.  Conversant. Mental status: Alert and Oriented x3 Neurologic: Speech Clear and organized, no gross focal findings or movement disorder appreciated. Respiratory: No cyanosis, no use of accessory musculature Cardiovascular: No pedal edema GI: Abdomen is soft and non-tender,  non-distended. Skin: Warm and dry.  No lesions in  the area of chief complaint. Extremities: Warm and well perfused w/o edema Psychiatric: Patient is competent for consent with normal mood and affect  MUSCULOSKELETAL:  LLE: Left hip pain with movement.  Sensation intact distally.  EHL, FHL, dorsiflexion, plantarflexion intact  Assessment / Plan: Principal Problem:   Left displaced femoral neck fracture (HCC) Active Problems:   Hip fracture (HCC)   Closed displaced left femoral neck fracture  Plan for Operative fixation today -NPO  -Medicine team to admit and perform pre-op clearance.  Cardiology consulted by medicine due to comorbidities, abnormal EKG and age. -PT/OT post op  The risks benefits and alternatives of the procedure were discussed with the patient including but not limited to infection, bleeding, nerve injury, the need for revision surgery, blood clots, cardiopulmonary complications, morbidity, mortality, among others.  The patient  verbalizes understanding and wishes to proceed.     Weightbearing: Bedrest.  Will amend post op. VTE prophylaxis:  SCDs.  Likely continue Lovenox postoperatively Pain control: Tylenol.  Minimize narcotics if able. Follow-up plan: Will follow in acute inpatient post-op phase.  Plan for outpatient follow up with Dr. Alain Marion in about 2 weeks. Contact information:  Edmonia Lynch MD, Roxan Hockey PA-C  Prudencio Burly III PA-C 03/11/2019 6:41 AM

## 2019-03-11 NOTE — Progress Notes (Signed)
PROGRESS NOTE    Tamara Gregory  XHF:[id number] DOB: 08/01/1925 DOA: 03/10/2019 PCP: Binnie Rail, MD   Brief Narrative:  Per HPI Tamara Gregory is Tamara Gregory 85 y.o. female with medical history significant of colon cancer s/p resection, chronic back pain s/p lumbar laminectomy and decompression microdisectomy ,iron deficiency anemia, h/o of ocular myasthenia gravis, GERD, headaches, hypertension, hyperlipidemia, macular degeneration, memory deficits, thoracic aneurysm, was brought in for persistent left hip pain and unable to bear weight on the left side. She reports pain on the left hip. She couldn't remember how she fell and I couldn't get detailed history. On talking to her husband, he reports patient fell "Sunday night and Monday night and was unable to walk. There was no fever or chills.   ED Course: on arrival to ED, she was afebrile, slightly tachycardic, hypertensive ,. Lab work revealed hypokalemia, mild leukocytosis of 12.6, hemoglobin of 10.6,. UA shows positive nitrite, many bacteria with moderate leukocytes. CT of the left hip shows Acute subcapital fracture of the left hip demonstrates mild impaction and anterior displacement of femoral neck.  Pt denies any dysuria, . Orthopedics consulted and recommended to transfer the patient to [facility] for hip repair.   She was referred to medical service for admission.   Assessment & Plan:   Principal Problem:   Left displaced femoral neck fracture (HCC) Active Problems:   Hip fracture (HCC)   Preoperative cardiovascular examination   Left Subcapital Fracture of the Hip:  2/2 mechanical fall in the bathroom  Appreciate orthopedics assistance, now s/p hemiarthroplasty on 4/9  Cardiology was c/s for clearance by admitting provider - appreciate assistance, per cards pt at increased risk, but not prohibitive (watch large fluid volumes and BP perioperatively) Lovenox post op for DVT ppx PT/OT  Hypertension: Elevated BP's at admission Now  improving Resume HCTZ Holding losartan for now Follow BP's   Hyperlipidemia: Crestor  Hypokalemia:  improved  H/o ocular myasthenia gravis:  Pt is on chronic steroids, continue the same.   H/o colon cancer s/p surgical resection:   UTI: Urine culture with gram negative rods Continue ceftriaxone    Anemia of chronic disease:  Hemoglobin stable around 10.   Prolonged QT; Replace K and Magnesium  . Keep K>4 and magnesium >2.  Repeat EKG in AM  DVT prophylaxis: lovenox Code Status: full  Family Communication: none at bedside Disposition Plan: pending PT/OT eval, ortho s/o   Consultants:   Orthopedics  cardiology  Procedures:   4/9 hemiarthroplasty  Antimicrobials:  Anti-infectives (From admission, onward)   Start     Dose/Rate Route Frequency Ordered Stop   03/11/19 1800  cefTRIAXone (ROCEPHIN) 1 g in sodium chloride 0.9 % 100 mL IVPB  Status:  Discontinued     1 g 200 mL/hr over 30 Minutes Intravenous Every 24 hours 03/10/19 1641 03/10/19 1644   03/11/19 1800  ceFAZolin (ANCEF) IVPB 2g/100 mL premix     2 g 200 mL/hr over 30 Minutes Intravenous  Once 03/11/19 1527     04" /09/20 1100  ceFAZolin (ANCEF) IVPB 2g/100 mL premix     2 g 200 mL/hr over 30 Minutes Intravenous To [facility] 03/11/19 0450 03/11/19 1208   03/10/19 1800  cefTRIAXone (ROCEPHIN) 1 g in sodium chloride 0.9 % 100 mL IVPB     1 g 200 mL/hr over 30 Minutes Intravenous Every 24 hours 03/10/19 1644       Subjective: Feeling ok. Tamara Gregory little bit of Tamara Gregory headache. Leg pain is controlled.  Denies MI, stroke. Main medical issue she thinks is her back pain.  Objective: Vitals:   03/11/19 1434 03/11/19 1445 03/11/19 1449 03/11/19 1509  BP: 137/71  (!) 142/76 (!) 151/78  Pulse: 72 76 73 76  Resp: 17 17 15 18   Temp:   97.7 F (36.5 C) 98.8 F (37.1 C)  TempSrc:    Axillary  SpO2: 98% 96% 98% 93%  Weight:      Height:        Intake/Output Summary (Last 24 hours) at 03/11/2019  1533 Last data filed at 03/11/2019 1500 Gross per 24 hour  Intake 4684.15 ml  Output 100 ml  Net 4584.15 ml   Filed Weights   03/10/19 1113 03/10/19 1816 03/11/19 1041  Weight: 68 kg 66.9 kg 66.9 kg    Examination:  General exam: Appears calm and comfortable  Respiratory system: Clear to auscultation. Respiratory effort normal. Cardiovascular system: S1 & S2 heard, RRR. Gastrointestinal system: Abdomen is nondistended, soft and nontender. . Central nervous system: Alert and oriented. No focal neurological deficits. Extremities: left leg shortened and internally rotated Skin: No rashes, lesions or ulcers Psychiatry: Judgement and insight appear normal. Mood & affect appropriate.     Data Reviewed: I have personally reviewed following labs and imaging studies  CBC: Recent Labs  Lab 03/10/19 1159 03/11/19 0336  WBC 12.6* 9.2  NEUTROABS 9.5*  --   HGB 10.6* 9.6*  HCT 34.0* 32.2*  MCV 96.3 95.0  PLT 178 960   Basic Metabolic Panel: Recent Labs  Lab 03/10/19 1159 03/10/19 1200 03/11/19 0336  NA 139  --  141  K 3.3*  --  4.0  CL 105  --  106  CO2 25  --  23  GLUCOSE 132*  --  118*  BUN 25*  --  15  CREATININE 0.74  --  0.54  CALCIUM 10.3  --  8.9  MG  --  2.0  --    GFR: Estimated Creatinine Clearance: 42 mL/min (by C-G formula based on SCr of 0.54 mg/dL). Liver Function Tests: Recent Labs  Lab 03/11/19 0336  AST 25  ALT 22  ALKPHOS 51  BILITOT 0.7  PROT 5.7*  ALBUMIN 3.3*   No results for input(s): LIPASE, AMYLASE in the last 168 hours. No results for input(s): AMMONIA in the last 168 hours. Coagulation Profile: No results for input(s): INR, PROTIME in the last 168 hours. Cardiac Enzymes: No results for input(s): CKTOTAL, CKMB, CKMBINDEX, TROPONINI in the last 168 hours. BNP (last 3 results) No results for input(s): PROBNP in the last 8760 hours. HbA1C: No results for input(s): HGBA1C in the last 72 hours. CBG: No results for input(s): GLUCAP in  the last 168 hours. Lipid Profile: No results for input(s): CHOL, HDL, LDLCALC, TRIG, CHOLHDL, LDLDIRECT in the last 72 hours. Thyroid Function Tests: No results for input(s): TSH, T4TOTAL, FREET4, T3FREE, THYROIDAB in the last 72 hours. Anemia Panel: No results for input(s): VITAMINB12, FOLATE, FERRITIN, TIBC, IRON, RETICCTPCT in the last 72 hours. Sepsis Labs: No results for input(s): PROCALCITON, LATICACIDVEN in the last 168 hours.  Recent Results (from the past 240 hour(s))  Culture, Urine     Status: Abnormal (Preliminary result)   Collection Time: 03/10/19  4:42 PM  Result Value Ref Range Status   Specimen Description   Final    URINE, RANDOM Performed at [facility] [address], [place], [place] [address]    Special Requests   Final  NONE Performed at [facility], [place] [address], [place], [place] [address]    Culture >=100,000 COLONIES/mL GRAM NEGATIVE RODS (Tamara Gregory)  Final   Report Status PENDING  Incomplete  Surgical pcr screen     Status: None   Collection Time: 03/10/19  9:54 PM  Result Value Ref Range Status   MRSA, PCR NEGATIVE NEGATIVE Final   Staphylococcus aureus NEGATIVE NEGATIVE Final    Comment: (NOTE) The Xpert SA Assay (FDA approved for NASAL specimens in patients 82 years of age and older), is one component of Roddie Riegler comprehensive surveillance program. It is not intended to diagnose infection nor to guide or monitor treatment. Performed at [facility], [place] [address] [place], [place] [address]          Radiology Studies: Ct Head Wo Contrast  Result Date: 03/10/2019 CLINICAL DATA:  Fall 3 days prior.  Headache. EXAM: CT HEAD WITHOUT CONTRAST TECHNIQUE: Contiguous axial images were obtained from the base of the skull through the vertex without intravenous contrast. COMPARISON:  None. FINDINGS: Brain: There is mild diffuse atrophy. There is no intracranial mass, hemorrhage, extra-axial fluid collection, or  midline shift. There is patchy small vessel disease in the centra semiovale bilaterally. There is mild small vessel disease in the thalamic regions. No acute appearing infarct evident on this study. Vascular: There is no appreciable hyperdense vessel. There is calcification in each carotid siphon region. Skull: The bony calvarium appears intact. Sinuses/Orbits: There is slight mucosal thickening in each maxillary antrum. There is mild mucosal thickening in several ethmoid air cells. Orbits appear symmetric bilaterally. Other: Mastoid air cells are clear. There is debris in each external auditory canal. IMPRESSION: Mild atrophy with mild patchy periventricular small vessel disease. No acute infarct. No mass or hemorrhage. Foci of arterial vascular calcification noted. Mucosal thickening noted in several ethmoid air cells. Probable cerumen in each external auditory canal. Electronically Signed   By: Lowella Grip III M.D.   On: 03/10/2019 14:24   Pelvis Portable  Result Date: 03/11/2019 CLINICAL DATA:  Left hip hemiarthroplasty EXAM: PORTABLE PELVIS 1-2 VIEWS COMPARISON:  None. FINDINGS: Status post hemiarthroplasty of the left hip with expected postoperative gas in the soft tissues. Alignment is normal. No periprosthetic fracture. Moderate right hip osteoarthrosis. IMPRESSION: Expected postoperative appearance following left hip hemiarthroplasty. Electronically Signed   By: Ulyses Jarred M.D.   On: 03/11/2019 14:57   Ct Hip Left Wo Contrast  Result Date: 03/10/2019 CLINICAL DATA:  Left hip pain since Marquest Gunkel fall 3 days ago. Initial encounter. EXAM: CT OF THE LEFT HIP WITHOUT CONTRAST TECHNIQUE: Multidetector CT imaging of the left hip was performed according to the standard protocol. Multiplanar CT image reconstructions were also generated. COMPARISON:  Plain films left hip this same day. FINDINGS: Bones/Joint/Cartilage The patient has an acute subcapital fracture of the left hip. There is impaction posteriorly  approximately 1.5 cm and anterior displacement of the femoral neck of 1 cm. No other fracture is identified. No focal bony lesion. Ligaments Suboptimally assessed by CT. Muscles and Tendons Intact. Soft tissues Imaged intrapelvic contents demonstrate some sigmoid diverticulosis. The patient is status post hysterectomy. IMPRESSION: Acute subcapital fracture of the left hip demonstrates mild impaction and anterior displacement of femoral neck. Electronically Signed   By: Inge Rise M.D.   On: 03/10/2019 14:50   Dg Hip Unilat W Or Wo Pelvis 2-3 Views Left  Result Date: 03/10/2019 CLINICAL DATA:  Left hip pain.  No known injury. EXAM: DG HIP (  WITH OR WITHOUT PELVIS) 2-3V LEFT COMPARISON:  None. FINDINGS: There is no acute bony or joint abnormality. No avascular necrosis of the femoral heads or focal lesion. Moderate to moderately severe degenerative disease about the hips appears worse on the right. Soft tissues are unremarkable. Spinal stimulator is noted. IMPRESSION: No acute finding. Moderate to moderately severe bilateral hip osteoarthritis appears worse on the right. Electronically Signed   By: Inge Rise M.D.   On: 03/10/2019 11:54        Scheduled Meds:  acetaminophen  650 mg Oral Q6H   brimonidine  1 drop Both Eyes TID   enoxaparin (LOVENOX) injection  40 mg Subcutaneous Q24H   fluticasone  1 spray Each Nare QHS   latanoprost  1 drop Left Eye QHS   predniSONE  10 mg Oral Q breakfast   senna  1 tablet Oral BID   Continuous Infusions:  sodium chloride 75 mL/hr at 03/10/19 1835    ceFAZolin (ANCEF) IV     cefTRIAXone (ROCEPHIN)  IV 1 g (03/10/19 1722)     LOS: 1 day    Time spent: over 30 min    Fayrene Helper, MD [facility] Pager AMION  If 7PM-7AM, please contact night-coverage [email or web address] Password [facility] 03/11/2019, 3:33 PM

## 2019-03-11 NOTE — Anesthesia Preprocedure Evaluation (Addendum)
Anesthesia Evaluation  Patient identified by MRN, date of birth, ID band Patient awake    Reviewed: Allergy & Precautions, NPO status , Patient's Chart, lab work & pertinent test results  Airway Mallampati: III  TM Distance: <3 FB Neck ROM: Full    Dental no notable dental hx.    Pulmonary neg pulmonary ROS,    Pulmonary exam normal breath sounds clear to auscultation       Cardiovascular hypertension, + Peripheral Vascular Disease  Normal cardiovascular exam Rhythm:Regular Rate:Normal     Neuro/Psych negative neurological ROS  negative psych ROS   GI/Hepatic Neg liver ROS, GERD  ,  Endo/Other  negative endocrine ROS  Renal/GU negative Renal ROS  negative genitourinary   Musculoskeletal negative musculoskeletal ROS (+)   Abdominal   Peds negative pediatric ROS (+)  Hematology negative hematology ROS (+)   Anesthesia Other Findings   Reproductive/Obstetrics negative OB ROS                           Anesthesia Physical Anesthesia Plan  ASA: III  Anesthesia Plan: General   Post-op Pain Management:    Induction: Intravenous and Rapid sequence  PONV Risk Score and Plan: 3 and Ondansetron, Dexamethasone and Treatment may vary due to age or medical condition  Airway Management Planned: Oral ETT  Additional Equipment:   Intra-op Plan:   Post-operative Plan: Extubation in OR  Informed Consent: I have reviewed the patients History and Physical, chart, labs and discussed the procedure including the risks, benefits and alternatives for the proposed anesthesia with the patient or authorized representative who has indicated his/her understanding and acceptance.     Dental advisory given  Plan Discussed with: CRNA and Surgeon  Anesthesia Plan Comments:         Anesthesia Quick Evaluation

## 2019-03-11 NOTE — Consult Note (Addendum)
ORTHOPAEDIC CONSULTATION  REQUESTING PHYSICIAN: Elodia Florence., *  Chief Complaint: Left hip pain, fall  HPI: Tamara Gregory is a 85 y.o. female who complains of left hip pain and inability to bear weight.  She presented to the emergency department at [facility] long chloric images showed a displaced left femoral neck fracture.  Orthopedics was consulted for evaluation and she was transferred to [facility] for surgical treatment and medical clearance.  She denies history of MI, CVA, DVT, PE.  Previously ambulatory with the aid of a cane or walker.    Past Medical History:  Diagnosis Date   Anemia    Anxiety    Arthritis    Cancer ([place])    hx of skin cancer , colon    Cataract    removed both eyes    Chronic back pain    okay with sitting, increased back pain with activity    Depression    GERD (gastroesophageal reflux disease)    Glaucoma    Headache(784.0)    hx of   History of kidney stones    History of migraines    Hyperlipidemia    Hypertension    Macular degeneration    Myasthenia gravis ([place])    Occasional tremors    Pneumonia    Spinal stenosis    Thoracic aneurysm    4.4cm; see CT done 01/28/12 and 01/22/13 in [facility].    Uses hearing aid    Vitamin D deficiency    Past Surgical History:  Procedure Laterality Date   ABDOMINAL HYSTERECTOMY     CHOLECYSTECTOMY     COLON SURGERY     Right colectomy dr. gross 03-17-18   LUMBAR LAMINECTOMY/DECOMPRESSION MICRODISCECTOMY N/A 02/04/2013   Procedure: CENTRAL DECOMPRESSION/LUMBAR LAMINECTOMY L3-L4 AND L4-L5 2 LEVELS;  Surgeon: Tobi Bastos, MD;  Location: [facility];  Service: Orthopedics;  Laterality: N/A;   SPINAL CORD STIMULATOR IMPLANT     in right hip but not currently using because it did not help   TONSILLECTOMY     UPPER GASTROINTESTINAL ENDOSCOPY     US ECHOCARDIOGRAPHY  05/18/2007   EF 55-60%   Social History   Socioeconomic History   Marital status: Married     Spouse name: Not on file   Number of children: 3   Years of education: Not on file   Highest education level: Not on file  Occupational History   Occupation: retired  Scientist, product/process development strain: Not hard at [employer] insecurity:    Worry: Never true    Inability: Never true   Transportation needs:    Medical: No    Non-medical: No  Tobacco Use   Smoking status: Never Smoker   Smokeless tobacco: Never Used  Substance and Sexual Activity   Alcohol use: No   Drug use: No   Sexual activity: Not Currently  Lifestyle   Physical activity:    Days per week: 0 days    Minutes per session: 0 min   Stress: Rather much  Relationships   Social connections:    Talks on phone: More than three times a week    Gets together: Once a week    Attends religious service: 1 to 4 times per year    Active member of club or organization: Yes    Attends meetings of clubs or organizations: More than 4 times per year    Relationship status: Married  Other Topics Concern  Not on file  Social History Narrative   Lives with husband in a one story home.  Has 2 sons.  Retired.  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  Education: college.  [employer].   Family History  Problem Relation Age of Onset   Heart attack Father    Diabetes Father    Cancer Sister    Breast cancer Sister    Cancer Brother    Lung cancer Brother    Cancer Sister    Liver cancer Sister    Diabetes Sister    Colon polyps Neg Hx    Colon cancer Neg Hx    Esophageal cancer Neg Hx    Rectal cancer Neg Hx    Stomach cancer Neg Hx    Adrenal disorder Neg Hx    Allergies  Allergen Reactions   Tramadol Other (See Comments)    delirium   Prior to Admission medications   Medication Sig Start Date End Date Taking? Authorizing Provider  acetaminophen (TYLENOL) 500 MG tablet Take 2 tablets (1,000 mg total) by mouth 3 (three) times daily. 03/20/18  Yes Stark Klein, MD  aspirin 81 MG chewable tablet Chew 0.5 tablets (40.5 mg total) by mouth daily. 03/20/18  Yes Stark Klein, MD  brimonidine (ALPHAGAN) 0.2 % ophthalmic solution Place 1 drop into both eyes 3 (three) times daily.    Yes [provider]  Calcium Carbonate (CALCIUM 500 PO) Take 500 mg by mouth daily.    Yes [provider]  Cholecalciferol (VITAMIN D) 125 MCG (5000 UT) CAPS Take 5,000 Units by mouth daily.    Yes [provider]  fluticasone (FLONASE) 50 MCG/ACT nasal spray Place 1 spray into both nostrils at bedtime.   Yes [provider]  Glucosamine-Chondroit-Vit C-Mn (GLUCOSAMINE 1500 COMPLEX PO) Take 1 tablet by mouth daily.    Yes [provider]  latanoprost (XALATAN) 0.005 % ophthalmic solution Place 1 drop into the left eye at bedtime. 04/13/15  Yes [provider]  losartan-hydrochlorothiazide (HYZAAR) 50-12.5 MG tablet Take 1 tablet by mouth daily. 02/08/19  Yes Burns, Claudina Lick, MD  Multiple Vitamins-Minerals (PRESERVISION AREDS) CAPS Take 1 capsule by mouth 2 (two) times daily.    Yes [provider]  Omega-3 Fatty Acids (FISH OIL) 435 MG CAPS Take 435 mg by mouth daily.   Yes [provider]  predniSONE (DELTASONE) 10 MG tablet Take 2 tablets (20 mg total) by mouth daily with breakfast. Patient taking differently: Take 10 mg by mouth daily with breakfast.  02/08/19  Yes Burns, Claudina Lick, MD  rosuvastatin (CRESTOR) 5 MG tablet TAKE 1 TABLET EVERY DAY IN THE EVENING 12/30/18  Yes Burns, Claudina Lick, MD  sertraline (ZOLOFT) 100 MG tablet Take 1.5 tablets (150 mg total) by mouth at bedtime. Patient taking differently: Take 200 mg by mouth at bedtime.  12/30/18  Yes Binnie Rail, MD   Ct Head Wo Contrast  Result Date: 03/10/2019 CLINICAL DATA:  Fall 3 days prior.  Headache. EXAM: CT HEAD WITHOUT CONTRAST TECHNIQUE: Contiguous axial images were obtained from the base of the skull through the vertex without intravenous  contrast. COMPARISON:  None. FINDINGS: Brain: There is mild diffuse atrophy. There is no intracranial mass, hemorrhage, extra-axial fluid collection, or midline shift. There is patchy small vessel disease in the centra semiovale bilaterally. There is mild small vessel disease in the thalamic regions. No acute appearing infarct evident on this study. Vascular: There is no appreciable  hyperdense vessel. There is calcification in each carotid siphon region. Skull: The bony calvarium appears intact. Sinuses/Orbits: There is slight mucosal thickening in each maxillary antrum. There is mild mucosal thickening in several ethmoid air cells. Orbits appear symmetric bilaterally. Other: Mastoid air cells are clear. There is debris in each external auditory canal. IMPRESSION: Mild atrophy with mild patchy periventricular small vessel disease. No acute infarct. No mass or hemorrhage. Foci of arterial vascular calcification noted. Mucosal thickening noted in several ethmoid air cells. Probable cerumen in each external auditory canal. Electronically Signed   By: Lowella Grip III M.D.   On: 03/10/2019 14:24   Ct Hip Left Wo Contrast  Result Date: 03/10/2019 CLINICAL DATA:  Left hip pain since a fall 3 days ago. Initial encounter. EXAM: CT OF THE LEFT HIP WITHOUT CONTRAST TECHNIQUE: Multidetector CT imaging of the left hip was performed according to the standard protocol. Multiplanar CT image reconstructions were also generated. COMPARISON:  Plain films left hip this same day. FINDINGS: Bones/Joint/Cartilage The patient has an acute subcapital fracture of the left hip. There is impaction posteriorly approximately 1.5 cm and anterior displacement of the femoral neck of 1 cm. No other fracture is identified. No focal bony lesion. Ligaments Suboptimally assessed by CT. Muscles and Tendons Intact. Soft tissues Imaged intrapelvic contents demonstrate some sigmoid diverticulosis. The patient is status post hysterectomy.  IMPRESSION: Acute subcapital fracture of the left hip demonstrates mild impaction and anterior displacement of femoral neck. Electronically Signed   By: Inge Rise M.D.   On: 03/10/2019 14:50   Dg Hip Unilat W Or Wo Pelvis 2-3 Views Left  Result Date: 03/10/2019 CLINICAL DATA:  Left hip pain.  No known injury. EXAM: DG HIP (WITH OR WITHOUT PELVIS) 2-3V LEFT COMPARISON:  None. FINDINGS: There is no acute bony or joint abnormality. No avascular necrosis of the femoral heads or focal lesion. Moderate to moderately severe degenerative disease about the hips appears worse on the right. Soft tissues are unremarkable. Spinal stimulator is noted. IMPRESSION: No acute finding. Moderate to moderately severe bilateral hip osteoarthritis appears worse on the right. Electronically Signed   By: Inge Rise M.D.   On: 03/10/2019 11:54    Positive ROS: All other systems have been reviewed and were otherwise negative with the exception of those mentioned in the HPI and as above.  Objective: Labs cbc Recent Labs    03/10/19 1159 03/11/19 0336  WBC 12.6* 9.2  HGB 10.6* 9.6*  HCT 34.0* 32.2*  PLT 178 164    Labs inflam No results for input(s): CRP in the last 72 hours.  Invalid input(s): ESR  Labs coag No results for input(s): INR, PTT in the last 72 hours.  Invalid input(s): PT  Recent Labs    03/10/19 1159 03/11/19 0336  NA 139 141  K 3.3* 4.0  CL 105 106  CO2 25 23  GLUCOSE 132* 118*  BUN 25* 15  CREATININE 0.74 0.54  CALCIUM 10.3 8.9    Physical Exam: Vitals:   03/11/19 0524 03/11/19 0605  BP: (!) 204/104 (!) 162/90  Pulse: 76 85  Resp: 20   Temp:    SpO2:     General: Alert, no acute distress.  Conversant. Mental status: Alert and Oriented x3 Neurologic: Speech Clear and organized, no gross focal findings or movement disorder appreciated. Respiratory: No cyanosis, no use of accessory musculature Cardiovascular: No pedal edema GI: Abdomen is soft and non-tender,  non-distended. Skin: Warm and dry.  No lesions in  the area of chief complaint. Extremities: Warm and well perfused w/o edema Psychiatric: Patient is competent for consent with normal mood and affect  MUSCULOSKELETAL:  LLE: Left hip pain with movement.  Sensation intact distally.  EHL, FHL, dorsiflexion, plantarflexion intact  Assessment / Plan: Principal Problem:   Left displaced femoral neck fracture (HCC) Active Problems:   Hip fracture (HCC)   Closed displaced left femoral neck fracture  Plan for Operative fixation today -NPO  -Medicine team to admit and perform pre-op clearance.  Cardiology consulted by medicine due to comorbidities, abnormal EKG and age. -PT/OT post op  The risks benefits and alternatives of the procedure were discussed with the patient including but not limited to infection, bleeding, nerve injury, the need for revision surgery, blood clots, cardiopulmonary complications, morbidity, mortality, among others.  The patient  verbalizes understanding and wishes to proceed.     Weightbearing: Bedrest.  Will amend post op. VTE prophylaxis:  SCDs.  Likely continue Lovenox postoperatively Pain control: Tylenol.  Minimize narcotics if able. Follow-up plan: Will follow in acute inpatient post-op phase.  Plan for outpatient follow up with Dr. Alain Marion in about 2 weeks. Contact information:  Edmonia Lynch MD, Roxan Hockey PA-C  Prudencio Burly III PA-C 03/11/2019 6:41 AM

## 2019-03-11 NOTE — Transfer of Care (Signed)
Immediate Anesthesia Transfer of Care Note  Patient: Tamara Gregory  Procedure(s) Performed: ARTHROPLASTY BIPOLAR HIP (HEMIARTHROPLASTY) (Left Hip)  Patient Location: PACU  Anesthesia Type:General  Level of Consciousness: awake, alert  and oriented  Airway & Oxygen Therapy: Patient Spontanous Breathing and Patient connected to face mask oxygen  Post-op Assessment: Report given to RN and Post -op Vital signs reviewed and stable  Post vital signs: Reviewed and stable  Last Vitals:  Vitals Value Taken Time  BP 121/73 03/11/2019  1:50 PM  Temp    Pulse 71 03/11/2019  1:52 PM  Resp 19 03/11/2019  1:52 PM  SpO2 98 % 03/11/2019  1:52 PM  Vitals shown include unvalidated device data.  Last Pain:  Vitals:   03/11/19 1041  TempSrc:   PainSc: 0-No pain         Complications: No apparent anesthesia complications

## 2019-03-12 ENCOUNTER — Encounter (HOSPITAL_COMMUNITY): Payer: Self-pay | Admitting: Orthopedic Surgery

## 2019-03-12 LAB — BASIC METABOLIC PANEL
Anion gap: 13 (ref 5–15)
BUN: 19 mg/dL (ref 8–23)
CO2: 18 mmol/L — ABNORMAL LOW (ref 22–32)
Calcium: 8.5 mg/dL — ABNORMAL LOW (ref 8.9–10.3)
Chloride: 108 mmol/L (ref 98–111)
Creatinine, Ser: 0.73 mg/dL (ref 0.44–1.00)
GFR calc Af Amer: >60 mL/min (ref >60)
GFR calc non Af Amer: >60 mL/min (ref >60)
Glucose, Bld: 119 mg/dL — ABNORMAL HIGH (ref 70–99)
Potassium: 4.6 mmol/L (ref 3.5–5.1)
Sodium: 139 mmol/L (ref 135–145)

## 2019-03-12 LAB — CBC
HCT: 33.3 % — ABNORMAL LOW (ref 36.0–46.0)
Hemoglobin: 9.4 g/dL — ABNORMAL LOW (ref 12.0–15.0)
MCH: 28.6 pg (ref 26.0–34.0)
MCHC: 28.2 g/dL — ABNORMAL LOW (ref 30.0–36.0)
MCV: 101.2 fL — ABNORMAL HIGH (ref 80.0–100.0)
Platelets: 191 10*3/uL (ref 150–400)
RBC: 3.29 MIL/uL — ABNORMAL LOW (ref 3.87–5.11)
RDW: 16.9 % — ABNORMAL HIGH (ref 11.5–15.5)
WBC: 14.9 10*3/uL — ABNORMAL HIGH (ref 4.0–10.5)
nRBC: 0 % (ref 0.0–0.2)

## 2019-03-12 LAB — URINE CULTURE: Culture: >=100000 — AB

## 2019-03-12 MED ORDER — CEPHALEXIN 500 MG PO CAPS
500.0000 mg | ORAL_CAPSULE | Freq: Two times a day (BID) | ORAL | Status: DC
  Administered 2019-03-12 – 2019-03-15 (×6): 500 mg via ORAL
  Filled 2019-03-12 (×6): qty 1

## 2019-03-12 NOTE — NC FL2 (Signed)
[place] LEVEL OF CARE SCREENING TOOL     IDENTIFICATION  Patient Name: Tamara Gregory Birthdate: 1925-01-26 Sex: female Admission Date (Current Location): 03/10/2019  [facility] and [place] Number:  Herbalist and Address:  [facility] [place], [place] [address] [place], [place] [address]      Provider Number: [id number]  Attending Physician Name and Address:  Elodia Florence., *  Relative Name and Phone Number:  Dr. Precious Bard, husband, [phone]    Current Level of Care: Hospital Recommended Level of Care: [place] Prior Approval Number:    Date Approved/Denied:   PASRR Number: [id number]  Discharge Plan: SNF    Current Diagnoses: Patient Active Problem List   Diagnosis Date Noted  . Preoperative cardiovascular examination   . Hip fracture ([place]) 03/10/2019  . Left displaced femoral neck fracture ([place]) 03/10/2019  . Weakness 12/04/2018  . Hypokalemia 03/18/2018  . 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 Right ovarian cyst s/p RSO 03/17/2018  . Diarrhea 12/23/2017  . Large hiatal hernia 04/01/2017  . Sweating profusely 03/19/2017  . Hyperglycemia 03/19/2017  . Squamous cell skin cancer 11/05/2016  . Nosebleed 10/19/2016  . Osteopenia 08/28/2016  . Essential hypertension, benign 08/21/2016  . Long term current use of systemic steroids 08/21/2016  . GERD (gastroesophageal reflux disease) 05/06/2016  . Mixed incontinence urge and stress 05/06/2016  . Low hemoglobin 09/22/2014  . Insomnia 07/26/2014  . Malaise and fatigue 10/04/2013  . PAC (premature atrial contraction) 10/04/2013  . Depression 02/15/2013  . Spinal stenosis, lumbar region, with neurogenic claudication 02/04/2013  . Myasthenia gravis ([place]) 03/17/2012  . Pure hypercholesterolemia 09/05/2011  . Benign hypertensive heart disease without heart failure 09/05/2011  . Osteoarthritis 09/05/2011    Orientation  RESPIRATION BLADDER Height & Weight     Self, Situation, Place, Time(fluctuates- easy to reorient)  Normal, O2(intermittantly using 1L nasal canula) Continent, External catheter Weight: 147 lb 7.8 oz (66.9 kg) Height:  5\' 6"  (167.6 cm)  BEHAVIORAL SYMPTOMS/MOOD NEUROLOGICAL BOWEL NUTRITION STATUS      Continent Diet(heart healthy diet; thin liquids)  AMBULATORY STATUS COMMUNICATION OF NEEDS Skin   Extensive Assist Verbally Surgical wounds(incision on left hip with hydrocolloid dressing)                       Personal Care Assistance Level of Assistance  Bathing, Feeding, Dressing Bathing Assistance: Maximum assistance Feeding assistance: Independent Dressing Assistance: Maximum assistance     Functional Limitations Info  Sight, Hearing, Speech Sight Info: Adequate Hearing Info: Adequate Speech Info: Adequate    SPECIAL CARE FACTORS FREQUENCY  PT (By licensed PT), OT (By licensed OT)     PT Frequency: 5x week OT Frequency: 5x week            Contractures Contractures Info: Not present    Additional Factors Info  Code Status, Allergies Code Status Info: Full Code Allergies Info: TRAMADOL            Current Medications (03/12/2019):  This is the current hospital active medication list Current Facility-Administered Medications  Medication Dose Route Frequency Provider Last Rate Last Dose  . acetaminophen (TYLENOL) tablet 325-650 mg  325-650 mg Oral Q6H PRN Prudencio Burly III, PA-C      . acetaminophen (TYLENOL) tablet 650 mg  650 mg Oral Q6H Prudencio Burly III, PA-C   650 mg at 03/12/19 1236  . bisacodyl (DULCOLAX)  suppository 10 mg  10 mg Rectal Daily PRN Prudencio Burly III, PA-C      . brimonidine (ALPHAGAN) 0.2 % ophthalmic solution 1 drop  1 drop Both Eyes TID Prudencio Burly III, PA-C   1 drop at 03/12/19 1609  . cephALEXin (KEFLEX) capsule 500 mg  500 mg Oral BID Elodia Florence., MD   500 mg at 03/12/19 1608  .  enoxaparin (LOVENOX) injection 40 mg  40 mg Subcutaneous Q24H Prudencio Burly III, PA-C   40 mg at 03/11/19 1649  . fluticasone (FLONASE) 50 MCG/ACT nasal spray 1 spray  1 spray Each Nare QHS Prudencio Burly III, PA-C   1 spray at 03/11/19 2300  . hydrochlorothiazide (MICROZIDE) capsule 12.5 mg  12.5 mg Oral Daily Elodia Florence., MD   12.5 mg at 03/12/19 0254  . HYDROcodone-acetaminophen (NORCO/VICODIN) 5-325 MG per tablet 1 tablet  1 tablet Oral Q6H PRN Prudencio Burly III, PA-C   1 tablet at 03/11/19 2301  . HYDROmorphone (DILAUDID) injection 0.25-0.5 mg  0.25-0.5 mg Intravenous Q4H PRN Prudencio Burly III, PA-C      . latanoprost (XALATAN) 0.005 % ophthalmic solution 1 drop  1 drop Left Eye QHS Martensen, Charna Elizabeth III, PA-C   1 drop at 03/11/19 2300  . metoCLOPramide (REGLAN) tablet 5-10 mg  5-10 mg Oral Q8H PRN Prudencio Burly III, PA-C       Or  . metoCLOPramide (REGLAN) injection 5-10 mg  5-10 mg Intravenous Q8H PRN Prudencio Burly III, PA-C      . ondansetron [facility]) tablet 4 mg  4 mg Oral Q6H PRN Prudencio Burly III, PA-C       Or  . ondansetron (ZOFRAN) injection 4 mg  4 mg Intravenous Q6H PRN Prudencio Burly III, PA-C      . polyethylene glycol (MIRALAX / GLYCOLAX) packet 17 g  17 g Oral BID Elodia Florence., MD   17 g at 03/12/19 2706  . predniSONE (DELTASONE) tablet 10 mg  10 mg Oral Q breakfast Prudencio Burly III, PA-C   10 mg at 03/12/19 2376  . rosuvastatin (CRESTOR) tablet 5 mg  5 mg Oral q1800 Elodia Florence., MD   5 mg at 03/11/19 1648  . senna (SENOKOT) tablet 8.6 mg  1 tablet Oral BID Prudencio Burly III, PA-C   8.6 mg at 03/12/19 2831  . sodium phosphate (FLEET) 7-19 GM/118ML enema 1 enema  1 enema Rectal Once PRN Prudencio Burly III, PA-C         Discharge Medications: Please see discharge summary for a list of discharge medications.  Relevant Imaging  Results:  Relevant Lab Results:   Additional Information [id number] [place], [place]

## 2019-03-12 NOTE — Social Work (Signed)
Called pt husband Dr. Precious Bard, no answer on cellphone or home phone. HIPAA compliant message left on machine requesting a return call.   CSW continuing to follow for support with disposition when medically appropriate.  Westley Hummer, MSW, [place] Work [phone]

## 2019-03-12 NOTE — TOC Initial Note (Signed)
Transition of Care [facility]) - Initial/Assessment Note    Patient Details  Name: Tamara Gregory MRN: [id number] Date of Birth: 07/24/1925  Transition of Care [facility]) CM/SW Contact:    Alexander Mt, [place] Phone Number: 03/12/2019, 10:09 AM  Clinical Narrative:                 Spoke with pt spouse Ray and pt adult son [name]. Pt from home with assistance of husband and PT services/DME. Pt son and spouse are supportive, want pt to get rehab before coming back home. CSW explained that PT/OT would be in to see pt and make recommendations. Pt spouse and pt son are both extremely interested in [place] as they had a family friend who is a case manager tell them that was a good idea. CSW explained that ultimately it is up to the therapist as to their recommendations and it would depend on how pt is able to rehab.   Pt does have strong home support, requested PT scheduled to see pt to see if pt was appropriate and call family with recommendations.   CSW will continue to follow for therapy recommendations.   Expected Discharge Plan: Skilled Nursing Facility Barriers to Discharge: Continued Medical Work up, Ship broker, Environmental education officer)   Patient Goals and CMS Choice Patient states their goals for this hospitalization and ongoing recovery are:: "for [facility] and then come home"- pt son and pt spouse   Choice offered to / list presented to : Spouse, Adult Children  Expected Discharge Plan and Services Expected Discharge Plan: [place] In-house Referral: NA Discharge Planning Services: NA Post Acute Care Choice: IP Rehab Living arrangements for the past 2 months: Single Family Home Expected Discharge Date: (unknown)                        Prior Living Arrangements/Services Living arrangements for the past 2 months: Single Family Home Lives with:: Spouse Patient language and need for interpreter reviewed:: No Do you feel safe going back to  the place where you live?: Yes      Need for Family Participation in Patient Care: Yes (Comment)(physical assistance; decision making) Care giver support system in place?: Yes (comment)(pt spouse; adult children) Current home services: Home PT, DME Criminal Activity/Legal Involvement Pertinent to Current Situation/Hospitalization: No - Comment as needed  Activities of Daily Living Home Assistive Devices/Equipment: Cane (specify quad or straight), Eyeglasses, Walker (specify type), Wheelchair(4 wheeled walker, single point cane) ADL Screening (condition at time of admission) Patient's cognitive ability adequate to safely complete daily activities?: No Is the patient deaf or have difficulty hearing?: Yes Does the patient have difficulty seeing, even when wearing glasses/contacts?: No Does the patient have difficulty concentrating, remembering, or making decisions?: Yes Patient able to express need for assistance with ADLs?: No Does the patient have difficulty dressing or bathing?: Yes Independently performs ADLs?: No Communication: Independent Dressing (OT): Dependent Is this a change from baseline?: Change from baseline, expected to last >3 days Grooming: Dependent Is this a change from baseline?: Change from baseline, expected to last >3 days Feeding: Dependent Is this a change from baseline?: Change from baseline, expected to last >3 days Bathing: Dependent Is this a change from baseline?: Change from baseline, expected to last >3 days Toileting: Dependent Is this a change from baseline?: Change from baseline, expected to last >3days In/Out Bed: Dependent Is this a change from baseline?: Change from baseline,  expected to last >3 days Walks in Home: Dependent Is this a change from baseline?: Change from baseline, expected to last >3 days Does the patient have difficulty walking or climbing stairs?: Yes Weakness of Legs: Both(left hip fracture but has been weak-legged prior to  fracture) Weakness of Arms/Hands: Both  Permission Sought/Granted Permission sought to share information with : Facility Sport and exercise psychologist, Family Supports Permission granted to share information with : No(pt with fluctuating mentation)  Share Information with NAME: Ray Vertell Limber; Willette Alma  Permission granted to share info w AGENCY: CIR  Permission granted to share info w Relationship: spouse; son  Permission granted to share info w Contact Information: [phone]; [phone]  Emotional Assessment Appearance:: Other (Comment Required(spoke with family) Attitude/Demeanor/Rapport: Unable to Assess Affect (typically observed): Unable to Assess Orientation: : Oriented to Self, Oriented to Place, Oriented to  Time, Oriented to Situation, Fluctuating Orientation (Suspected and/or reported Sundowners) Alcohol / Substance Use: Not Applicable Psych Involvement: Outpatient Provider  Admission diagnosis:  Closed fracture of left hip, initial encounter [facility]) [S72.002A] Patient Active Problem List   Diagnosis Date Noted  . Preoperative cardiovascular examination   . Hip fracture ([place]) 03/10/2019  . Left displaced femoral neck fracture ([place]) 03/10/2019  . Weakness 12/04/2018  . Hypokalemia 03/18/2018  . 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 Right ovarian cyst s/p RSO 03/17/2018  . Diarrhea 12/23/2017  . Large hiatal hernia 04/01/2017  . Sweating profusely 03/19/2017  . Hyperglycemia 03/19/2017  . Squamous cell skin cancer 11/05/2016  . Nosebleed 10/19/2016  . Osteopenia 08/28/2016  . Essential hypertension, benign 08/21/2016  . Long term current use of systemic steroids 08/21/2016  . GERD (gastroesophageal reflux disease) 05/06/2016  . Mixed incontinence urge and stress 05/06/2016  . Low hemoglobin 09/22/2014  . Insomnia 07/26/2014  . Malaise and fatigue 10/04/2013  . PAC (premature atrial contraction) 10/04/2013  . Depression 02/15/2013  . Spinal  stenosis, lumbar region, with neurogenic claudication 02/04/2013  . Myasthenia gravis ([place]) 03/17/2012  . Pure hypercholesterolemia 09/05/2011  . Benign hypertensive heart disease without heart failure 09/05/2011  . Osteoarthritis 09/05/2011   PCP:  Binnie Rail, MD Pharmacy:   [facility] [address] [place] [address] Phone: [phone] Fax: [phone]     Social Determinants of Health ([place]) Interventions    Readmission Risk Interventions No flowsheet data found.

## 2019-03-12 NOTE — Progress Notes (Signed)
PROGRESS NOTE    MARTA BOUIE  JTT:[id number] DOB: [birth year] DOA: 03/10/2019 PCP: Binnie Rail, MD   Brief Narrative:  Per HPI FRANKI ALCAIDE is Khaliya Golinski 85 y.o. female with medical history significant of colon cancer s/p resection, chronic back pain s/p lumbar laminectomy and decompression microdisectomy ,iron deficiency anemia, h/o of ocular myasthenia gravis, GERD, headaches, hypertension, hyperlipidemia, macular degeneration, memory deficits, thoracic aneurysm, was brought in for persistent left hip pain and unable to bear weight on the left side. She reports pain on the left hip. She couldn't remember how she fell and I couldn't get detailed history. On talking to her husband, he reports patient fell "Sunday night and Monday night and was unable to walk. There was no fever or chills.   ED Course: on arrival to ED, she was afebrile, slightly tachycardic, hypertensive ,. Lab work revealed hypokalemia, mild leukocytosis of 12.6, hemoglobin of 10.6,. UA shows positive nitrite, many bacteria with moderate leukocytes. CT of the left hip shows Acute subcapital fracture of the left hip demonstrates mild impaction and anterior displacement of femoral neck.  Pt denies any dysuria, . Orthopedics consulted and recommended to transfer the patient to [facility] for hip repair.   She was referred to medical service for admission.   Assessment & Plan:   Principal Problem:   Left displaced femoral neck fracture (HCC) Active Problems:   Hip fracture (HCC)   Preoperative cardiovascular examination   Left Subcapital Fracture of the Hip:  2/2 mechanical fall in the bathroom  Appreciate orthopedics assistance, now s/p hemiarthroplasty on 4/9  Cardiology was c/s for clearance by admitting provider - appreciate assistance, per cards pt at increased risk, but not prohibitive (watch large fluid volumes and BP perioperatively) Lovenox post op for DVT ppx PT/OT Ok for d/c when cleared by PT WBAT per ortho   Hypertension: Elevated BP's at admission Now improving Resume HCTZ Holding losartan for now Fluctuating, follow  Hyperlipidemia: Crestor  Hypokalemia:  improved  H/o ocular myasthenia gravis:  Pt is on chronic steroids, continue the same.   H/o colon cancer s/p surgical resection:   UTI: E. Coli sensitive to ancef, narrowed to keflex  Anemia of chronic disease:  Hemoglobin stable around 10.   Prolonged QT; Replace K and Magnesium  . Keep K>4 and magnesium >2.  Repeat EKG in AM (pending)  DVT prophylaxis: lovenox Code Status: full  Family Communication: none at bedside Disposition Plan: pending PT/OT eval, ortho s/o   Consultants:   Orthopedics  cardiology  Procedures:   4/9 hemiarthroplasty  Antimicrobials:  Anti-infectives (From admission, onward)   Start     Dose/Rate Route Frequency Ordered Stop   03/12/19 1600  cephALEXin (KEFLEX) capsule 500 mg     500 mg Oral 2 times daily 03/12/19 0809     04" /09/20 1800  cefTRIAXone (ROCEPHIN) 1 g in sodium chloride 0.9 % 100 mL IVPB  Status:  Discontinued     1 g 200 mL/hr over 30 Minutes Intravenous Every 24 hours 03/10/19 1641 03/10/19 1644   03/11/19 1800  ceFAZolin (ANCEF) IVPB 2g/100 mL premix     2 g 200 mL/hr over 30 Minutes Intravenous  Once 03/11/19 1527 03/11/19 2026   03/11/19 1100  ceFAZolin (ANCEF) IVPB 2g/100 mL premix     2 g 200 mL/hr over 30 Minutes Intravenous To ShortStay Surgical 03/11/19 0450 03/11/19 1208   03/10/19 1800  cefTRIAXone (ROCEPHIN) 1 g in sodium chloride 0.9 % 100 mL IVPB  Status:  Discontinued  1 g 200 mL/hr over 30 Minutes Intravenous Every 24 hours 03/10/19 1644 03/12/19 0809     Subjective: Feels ok post op.  Objective: Vitals:   03/11/19 1910 03/11/19 2330 03/12/19 0445 03/12/19 1025  BP: 112/86 118/68 (!) 158/76 105/71  Pulse: 83 83 77 83  Resp: 16 16 16  (!) 24  Temp: 97.9 F (36.6 C) 98.2 F (36.8 C) 97.8 F (36.6 C) 98.4 F (36.9 C)  TempSrc:  Oral Oral Oral Oral  SpO2: 96% 95% 97% 94%  Weight:      Height:        Intake/Output Summary (Last 24 hours) at 03/12/2019 1206 Last data filed at 03/12/2019 0448 Gross per 24 hour  Intake 2300 ml  Output 300 ml  Net 2000 ml   Filed Weights   03/10/19 1113 03/10/19 1816 03/11/19 1041  Weight: 68 kg 66.9 kg 66.9 kg    Examination:  General: No acute distress. Cardiovascular: Heart sounds show Vyla Pint regular rate, and rhythm. Lungs: Clear to auscultation bilaterally Abdomen: Soft, nontender, nondistended  Neurological: Alert and oriented 3. Moves all extremities 4. Cranial nerves II through XII grossly intact. Skin: Warm and dry. No rashes or lesions. Extremities: LLE in immobilizer Psychiatric: Mood and affect are normal. Insight and judgment are appropriate.   Data Reviewed: I have personally reviewed following labs and imaging studies  CBC: Recent Labs  Lab 03/10/19 1159 03/11/19 0336 03/12/19 0810  WBC 12.6* 9.2 14.9*  NEUTROABS 9.5*  --   --   HGB 10.6* 9.6* 9.4*  HCT 34.0* 32.2* 33.3*  MCV 96.3 95.0 101.2*  PLT 178 164 732   Basic Metabolic Panel: Recent Labs  Lab 03/10/19 1159 03/10/19 1200 03/11/19 0336 03/12/19 0810  NA 139  --  141 139  K 3.3*  --  4.0 4.6  CL 105  --  106 108  CO2 25  --  23 18*  GLUCOSE 132*  --  118* 119*  BUN 25*  --  15 19  CREATININE 0.74  --  0.54 0.73  CALCIUM 10.3  --  8.9 8.5*  MG  --  2.0  --   --    GFR: Estimated Creatinine Clearance: 42 mL/min (by C-G formula based on SCr of 0.73 mg/dL). Liver Function Tests: Recent Labs  Lab 03/11/19 0336  AST 25  ALT 22  ALKPHOS 51  BILITOT 0.7  PROT 5.7*  ALBUMIN 3.3*   No results for input(s): LIPASE, AMYLASE in the last 168 hours. No results for input(s): AMMONIA in the last 168 hours. Coagulation Profile: No results for input(s): INR, PROTIME in the last 168 hours. Cardiac Enzymes: No results for input(s): CKTOTAL, CKMB, CKMBINDEX, TROPONINI in the last 168 hours.  BNP (last 3 results) No results for input(s): PROBNP in the last 8760 hours. HbA1C: No results for input(s): HGBA1C in the last 72 hours. CBG: No results for input(s): GLUCAP in the last 168 hours. Lipid Profile: No results for input(s): CHOL, HDL, LDLCALC, TRIG, CHOLHDL, LDLDIRECT in the last 72 hours. Thyroid Function Tests: No results for input(s): TSH, T4TOTAL, FREET4, T3FREE, THYROIDAB in the last 72 hours. Anemia Panel: No results for input(s): VITAMINB12, FOLATE, FERRITIN, TIBC, IRON, RETICCTPCT in the last 72 hours. Sepsis Labs: No results for input(s): PROCALCITON, LATICACIDVEN in the last 168 hours.  Recent Results (from the past 240 hour(s))  Culture, Urine     Status: Abnormal   Collection Time: 03/10/19  4:42 PM  Result Value Ref Range Status  Specimen Description   Final    URINE, RANDOM Performed at [facility], [place] [address], [place], [place] [address]    Special Requests   Final    NONE Performed at [facility], [place] [address], [place], [place] [address]    Culture >=100,000 COLONIES/mL ESCHERICHIA COLI (Josey Forcier)  Final   Report Status 03/12/2019 FINAL  Final   Organism ID, Bacteria ESCHERICHIA COLI (Laportia Carley)  Final      Susceptibility   Escherichia coli - MIC*    AMPICILLIN 4 SENSITIVE Sensitive     CEFAZOLIN <=4 SENSITIVE Sensitive     CEFTRIAXONE <=1 SENSITIVE Sensitive     CIPROFLOXACIN <=0.25 SENSITIVE Sensitive     GENTAMICIN <=1 SENSITIVE Sensitive     IMIPENEM <=0.25 SENSITIVE Sensitive     NITROFURANTOIN <=16 SENSITIVE Sensitive     TRIMETH/SULFA <=20 SENSITIVE Sensitive     AMPICILLIN/SULBACTAM <=2 SENSITIVE Sensitive     PIP/TAZO <=4 SENSITIVE Sensitive     Extended ESBL NEGATIVE Sensitive     * >=100,000 COLONIES/mL ESCHERICHIA COLI  Surgical pcr screen     Status: None   Collection Time: 03/10/19  9:54 PM  Result Value Ref Range Status   MRSA, PCR NEGATIVE NEGATIVE Final   Staphylococcus aureus NEGATIVE  NEGATIVE Final    Comment: (NOTE) The Xpert SA Assay (FDA approved for NASAL specimens in patients 73 years of age and older), is one component of [employer] [facility] comprehensive surveillance program. It is not intended to diagnose infection nor to guide or monitor treatment. Performed at [facility], [place] [address] [place], [place] [address]          Radiology Studies: Ct Head Wo Contrast  Result Date: 03/10/2019 CLINICAL DATA:  Fall 3 days prior.  Headache. EXAM: CT HEAD WITHOUT CONTRAST TECHNIQUE: Contiguous axial images were obtained from the base of the skull through the vertex without intravenous contrast. COMPARISON:  None. FINDINGS: Brain: There is mild diffuse atrophy. There is no intracranial mass, hemorrhage, extra-axial fluid collection, or midline shift. There is patchy small vessel disease in the centra semiovale bilaterally. There is mild small vessel disease in the thalamic regions. No acute appearing infarct evident on this study. Vascular: There is no appreciable hyperdense vessel. There is calcification in each carotid siphon region. Skull: The bony calvarium appears intact. Sinuses/Orbits: There is slight mucosal thickening in each maxillary antrum. There is mild mucosal thickening in several ethmoid air cells. Orbits appear symmetric bilaterally. Other: Mastoid air cells are clear. There is debris in each external auditory canal. IMPRESSION: Mild atrophy with mild patchy periventricular small vessel disease. No acute infarct. No mass or hemorrhage. Foci of arterial vascular calcification noted. Mucosal thickening noted in several ethmoid air cells. Probable cerumen in each external auditory canal. Electronically Signed   By: Lowella Grip III M.D.   On: 03/10/2019 14:24   Pelvis Portable  Result Date: 03/11/2019 CLINICAL DATA:  Left hip hemiarthroplasty EXAM: PORTABLE PELVIS 1-2 VIEWS COMPARISON:  None. FINDINGS: Status post hemiarthroplasty of the left hip with expected  postoperative gas in the soft tissues. Alignment is normal. No periprosthetic fracture. Moderate right hip osteoarthrosis. IMPRESSION: Expected postoperative appearance following left hip hemiarthroplasty. Electronically Signed   By: Ulyses Jarred M.D.   On: 03/11/2019 14:57   Ct Hip Left Wo Contrast  Result Date: 03/10/2019 CLINICAL DATA:  Left hip pain since Conway Fedora fall 3 days ago. Initial encounter. EXAM: CT OF THE LEFT HIP WITHOUT CONTRAST TECHNIQUE: Multidetector  CT imaging of the left hip was performed according to the standard protocol. Multiplanar CT image reconstructions were also generated. COMPARISON:  Plain films left hip this same day. FINDINGS: Bones/Joint/Cartilage The patient has an acute subcapital fracture of the left hip. There is impaction posteriorly approximately 1.5 cm and anterior displacement of the femoral neck of 1 cm. No other fracture is identified. No focal bony lesion. Ligaments Suboptimally assessed by CT. Muscles and Tendons Intact. Soft tissues Imaged intrapelvic contents demonstrate some sigmoid diverticulosis. The patient is status post hysterectomy. IMPRESSION: Acute subcapital fracture of the left hip demonstrates mild impaction and anterior displacement of femoral neck. Electronically Signed   By: Inge Rise M.D.   On: 03/10/2019 14:50        Scheduled Meds: . acetaminophen  650 mg Oral Q6H  . brimonidine  1 drop Both Eyes TID  . cephALEXin  500 mg Oral BID  . enoxaparin (LOVENOX) injection  40 mg Subcutaneous Q24H  . fluticasone  1 spray Each Nare QHS  . hydrochlorothiazide  12.5 mg Oral Daily  . latanoprost  1 drop Left Eye QHS  . polyethylene glycol  17 g Oral BID  . predniSONE  10 mg Oral Q breakfast  . rosuvastatin  5 mg Oral q1800  . senna  1 tablet Oral BID   Continuous Infusions: . sodium chloride 75 mL/hr at 03/12/19 0628     LOS: 2 days    Time spent: over 30 min    Fayrene Helper, MD [facility] Pager AMION  If 7PM-7AM,  please contact night-coverage [email or web address] Password [facility] 03/12/2019, 12:06 PM

## 2019-03-12 NOTE — Care Management Important Message (Signed)
Important Message  Patient Details  Name: Tamara Gregory MRN: [id number] Date of Birth: 10/15/1925   Medicare Important Message Given:  Yes    Ely Ballen 03/12/2019, 3:04 PM

## 2019-03-12 NOTE — Social Work (Signed)
Provided another call to pt son [name] who has called pt RN [name] x2 asking for CSW to provide update on therapy recommendations. Discussed that PT has still not seen the pt so I am unable to provide further referral until this recommendation is in. Have sent secure chat asking that PT/OT give pt son [name] a call with their recommendations.  CSW is on until 4:30pm- if PT recommendation not in by that time, I let pt son know that CSW covering this unit on the weekend could follow up with pt family.  CSW continuing to follow for support with disposition when medically appropriate.  Westley Hummer, MSW, [place] Work [phone]

## 2019-03-12 NOTE — TOC Progression Note (Addendum)
Transition of Care [facility]) - Progression Note    Patient Details  Name: Tamara Gregory MRN: [id number] Date of Birth: 1925/05/15  Transition of Care [facility]) CM/SW [place], [place] Phone Number: 03/12/2019, 4:03 PM  Clinical Narrative:    Spoke with pt therapist, pt recommended for SNF not CIR at this time due to ability to tolerate therapies and medical needs. CSW called pt son Tamara Gregory and pt spouse and explained updates. Pt son stated "my friend told me if this happened that I needed to ask for the CEO or someone to plead my case." CSW explained that this recommendation for SNF is based on the therapist's clinical assessment of what pt can and cannot tolerate and her ability to make measureable gains. Pt only was able to tolerate a short period of therapy and fatigued easily. This is not saying that pt is not capable, just that her needs are different at this time.  Pt son and spouse more receptive when CSW explained that pt would likely need regular therapy in a less intense setting where they would be able to meet the pt's needs for supervision and assistance. Pt spouse and pt son agreed to SNF referral being made, preference [facility].   Will f/u as pt progresses and let pt son know that someone would call them this weekend, likely Sunday with any offers. Any offers should be sent to [email or web address] and [email or web address]   Expected Discharge Plan: [place] Barriers to Discharge: Continued Medical Work up, Ship broker, Environmental education officer)  Expected Discharge Plan and Services Expected Discharge Plan: [place] In-house Referral: NA Discharge Planning Services: NA Post Acute Care Choice: IP Rehab Living arrangements for the past 2 months: Single Family Home Expected Discharge Date: (unknown)                         Social Determinants of Health ([place]) Interventions    Readmission Risk  Interventions No flowsheet data found.

## 2019-03-12 NOTE — Evaluation (Signed)
Physical Therapy Evaluation Patient Details Name: Tamara Gregory MRN: [id number] DOB: 06-05-1925 Today's Date: 03/12/2019   History of Present Illness  85 y.o. female with medical history significant of colon cancer s/p resection, chronic back pain s/p lumbar laminectomy and decompression microdisectomy ,iron deficiency anemia, h/o of ocular myasthenia gravis, GERD, headaches, hypertension, hyperlipidemia, macular degeneration, memory deficits, thoracic aneurysm, was brought in for persistent left hip pain and unable to bear weight on the left side. CT of the left hip shows Acute subcapital fracture of the left hip demonstrates mild impaction and anterior displacement of femoral neck s/p L hemiarthroplasty 03/11/2019  Clinical Impression  PTA pt living with 72 yo husband in one story home with ramped entry. Pt mobilized with use of RW and wheelchair, reporting 40 feet max ambulation. Pt's husband bathes and dresses her and completes all other iADLs. Pt currently limited in safe mobility by posterior hip precautions, fear of falling, and L hip pain in presence of generalized weakness and lack of endurance. Pt requires mod-max A for sit>stand to RW and is unable to off weight LE enough to perform stepping. PT recommends SNF level rehab at d/c. PT will continue to follow acutely.     Follow Up Recommendations SNF    Equipment Recommendations  Other (comment)(TBD at next venue)    Recommendations for Other Services OT consult     Precautions / Restrictions Precautions Precautions: Posterior Hip Precaution Booklet Issued: Yes (comment) Precaution Comments: pt able to recall 1/3 precautions at end of session  Restrictions Weight Bearing Restrictions: Yes LLE Weight Bearing: Weight bearing as tolerated      Mobility  Bed Mobility               General bed mobility comments: OOB in recliner on entry   Transfers Overall transfer level: Needs assistance Equipment used: Rolling walker (2  wheeled) Transfers: Sit to/from Stand Sit to Stand: Mod assist;Max assist         General transfer comment: pt able to come to standing with therapy twice, first time required modA for power up to RW, vc for hand placement, forward lean, upright posture, and anterior pelvic tilt, pt tremulous stating "I'm going to fall", encouraged pt to continue reach fully upright, once upright achieved attempted weightshift prior to attempting ambulation, continued cuing for upright posture, pt with complaints of UE fatigue, and sat back down, max verbal cuing for reaching back to chair before sitting, allowed pt to rest 3 minutes prior to attempting again, pt requires maxA on second attempt and is unable to achieve fully upright, cuing to reach back for chair for controlled descent into chair, pt did not attempt and landed with decreased eccentric control into the chair      Balance Overall balance assessment: Needs assistance Sitting-balance support: Feet supported;No upper extremity supported Sitting balance-Leahy Scale: Fair     Standing balance support: Bilateral upper extremity supported Standing balance-Leahy Scale: Poor Standing balance comment: requires UE support on RW                              Pertinent Vitals/Pain Pain Assessment: No/denies pain    Home Living Family/patient expects to be discharged to:: Private residence Living Arrangements: Spouse/significant other Available Help at Discharge: Family;Available 24 hours/day(91 year husband ) Type of Home: House Home Access: Stairs to enter;Ramped entrance   Entrance Stairs-Number of Steps: 1 Home Layout: One level Home Equipment: Clinical cytogeneticist -  2 wheels;Wheelchair - manual      Prior Function Level of Independence: Needs assistance   Gait / Transfers Assistance Needed: use wheelchair or walker for <40 feet  ADL's / Homemaking Assistance Needed: husband bathes and dresses her and does iADLS         Hand Dominance   Dominant Hand: Right    Extremity/Trunk Assessment   Upper Extremity Assessment Upper Extremity Assessment: Generalized weakness    Lower Extremity Assessment Lower Extremity Assessment: LLE deficits/detail LLE Deficits / Details: L LE in knee immobilizer, ankle ROM full  LLE: Unable to fully assess due to pain;Unable to fully assess due to immobilization    Cervical / Trunk Assessment Cervical / Trunk Assessment: Kyphotic  Communication   Communication: No difficulties  Cognition Arousal/Alertness: Awake/alert Behavior During Therapy: WFL for tasks assessed/performed Overall Cognitive Status: Within Functional Limits for tasks assessed                                        General Comments General comments (skin integrity, edema, etc.): Dressing clean, dry and in tact        Assessment/Plan    PT Assessment Patient needs continued PT services  PT Problem List Decreased strength;Decreased range of motion;Decreased activity tolerance;Decreased balance;Decreased mobility;Decreased coordination;Decreased cognition;Decreased safety awareness;Decreased knowledge of precautions;Pain       PT Treatment Interventions DME instruction;Gait training;Functional mobility training;Therapeutic activities;Therapeutic exercise;Balance training;Cognitive remediation;Patient/family education    PT Goals (Current goals can be found in the Care Plan section)  Acute Rehab PT Goals Patient Stated Goal: go home to be with husband PT Goal Formulation: With patient Time For Goal Achievement: 03/26/19 Potential to Achieve Goals: Fair    Frequency Min 3X/week   Barriers to discharge Decreased caregiver support         AM-PAC PT "6 Clicks" Mobility  Outcome Measure Help needed turning from your back to your side while in a flat bed without using bedrails?: A Lot Help needed moving from lying on your back to sitting on the side of a flat bed  without using bedrails?: A Lot Help needed moving to and from a bed to a chair (including a wheelchair)?: Total Help needed standing up from a chair using your arms (e.g., wheelchair or bedside chair)?: A Lot Help needed to walk in hospital room?: Total Help needed climbing 3-5 steps with a railing? : Total 6 Click Score: 9    End of Session Equipment Utilized During Treatment: Gait belt Activity Tolerance: Patient limited by fatigue;Patient limited by pain Patient left: in chair;with call bell/phone within reach;with chair alarm set Nurse Communication: Mobility status;Need for lift equipment;Precautions;Weight bearing status PT Visit Diagnosis: Unsteadiness on feet (R26.81);Other abnormalities of gait and mobility (R26.89);History of falling (Z91.81);Muscle weakness (generalized) (M62.81);Difficulty in walking, not elsewhere classified (R26.2);Pain Pain - Right/Left: Left Pain - part of body: Hip    Time: 1510-1543 PT Time Calculation (min) (ACUTE ONLY): 33 min   Charges:   PT Evaluation $PT Eval Moderate Complexity: 1 Mod PT Treatments $Therapeutic Activity: 8-22 mins        Alnisa Hasley B. Migdalia Dk PT, DPT Acute Rehabilitation Services Pager [phone] Office [phone]   Andrew 03/12/2019, 4:06 PM

## 2019-03-12 NOTE — Progress Notes (Addendum)
Patient ID: Tamara Gregory, female   DOB: [birth year], 85 y.o.   MRN: [id number]     Subjective:  Patient reports pain as mild to moderate.  Patient in the chair and asking to go home.  Denies any CP or  SOB  Objective:   VITALS:   Vitals:   03/11/19 1509 03/11/19 1910 03/11/19 2330 03/12/19 0445  BP: (!) 151/78 112/86 118/68 (!) 158/76  Pulse: 76 83 83 77  Resp: 18 16 16 16   Temp: 98.8 F (37.1 C) 97.9 F (36.6 C) 98.2 F (36.8 C) 97.8 F (36.6 C)  TempSrc: Axillary Oral Oral Oral  SpO2: 93% 96% 95% 97%  Weight:      Height:        ABD soft Sensation intact distally Dorsiflexion/Plantar flexion intact Incision: dressing C/D/I and no drainage   Lab Results  Component Value Date   WBC 14.9 (H) 03/12/2019   HGB 9.4 (L) 03/12/2019   HCT 33.3 (L) 03/12/2019   MCV 101.2 (H) 03/12/2019   PLT 191 03/12/2019   BMET    Component Value Date/Time   NA 139 03/12/2019 0810   K 4.6 03/12/2019 0810   CL 108 03/12/2019 0810   CO2 18 (L) 03/12/2019 0810   GLUCOSE 119 (H) 03/12/2019 0810   BUN 19 03/12/2019 0810   CREATININE 0.73 03/12/2019 0810   CREATININE 0.96 (H) 02/28/2016 0918   CALCIUM 8.5 (L) 03/12/2019 0810   GFRNONAA >60 03/12/2019 0810   GFRAA >60 03/12/2019 0810     Assessment/Plan: 1 Day Post-Op   Principal Problem:   Left displaced femoral neck fracture (HCC) Active Problems:   Hip fracture (HCC)   Preoperative cardiovascular examination   Advance diet Up with therapy Continue plan per medicine  WBAT Okay for DC when cleared with PT and medicine to minimize exposure risk    Lunette Stands 03/12/2019, 9:36 AM  Discussed and agree with above.  Marchia Bond, MD Cell [phone]

## 2019-03-13 LAB — CBC
HCT: 29.2 % — ABNORMAL LOW (ref 36.0–46.0)
Hemoglobin: 8.6 g/dL — ABNORMAL LOW (ref 12.0–15.0)
MCH: 28.5 pg (ref 26.0–34.0)
MCHC: 29.5 g/dL — ABNORMAL LOW (ref 30.0–36.0)
MCV: 96.7 fL (ref 80.0–100.0)
Platelets: 202 10*3/uL (ref 150–400)
RBC: 3.02 MIL/uL — ABNORMAL LOW (ref 3.87–5.11)
RDW: 17 % — ABNORMAL HIGH (ref 11.5–15.5)
WBC: 12 10*3/uL — ABNORMAL HIGH (ref 4.0–10.5)
nRBC: 0 % (ref 0.0–0.2)

## 2019-03-13 LAB — COMPREHENSIVE METABOLIC PANEL
ALT: 17 U/L (ref 0–44)
AST: 28 U/L (ref 15–41)
Albumin: 3 g/dL — ABNORMAL LOW (ref 3.5–5.0)
Alkaline Phosphatase: 58 U/L (ref 38–126)
Anion gap: 11 (ref 5–15)
BUN: 17 mg/dL (ref 8–23)
CO2: 22 mmol/L (ref 22–32)
Calcium: 8.8 mg/dL — ABNORMAL LOW (ref 8.9–10.3)
Chloride: 107 mmol/L (ref 98–111)
Creatinine, Ser: 0.74 mg/dL (ref 0.44–1.00)
GFR calc Af Amer: >60 mL/min (ref >60)
GFR calc non Af Amer: >60 mL/min (ref >60)
Glucose, Bld: 130 mg/dL — ABNORMAL HIGH (ref 70–99)
Potassium: 3.7 mmol/L (ref 3.5–5.1)
Sodium: 140 mmol/L (ref 135–145)
Total Bilirubin: 0.6 mg/dL (ref 0.3–1.2)
Total Protein: 5.6 g/dL — ABNORMAL LOW (ref 6.5–8.1)

## 2019-03-13 LAB — MAGNESIUM: Magnesium: 2 mg/dL (ref 1.7–2.4)

## 2019-03-13 NOTE — Plan of Care (Signed)

## 2019-03-13 NOTE — Progress Notes (Signed)
Called son of patient Rinoa Garramone @ [phone] to retrieve patient power of attorney paperwork and living will paperwork. Son stated that he will "try to get paperwork from father and bring it to the hospital today".

## 2019-03-13 NOTE — Progress Notes (Addendum)
**Note De-Identified vi Obfusction** PROGRESS NOTE    DIL WXMN  BMW:[id number] DOB: Nov 19, 1925 DO: 03/10/2019 PCP: Binnie Ril, MD   Brief Nrrtive:  Per HPI Tamara Gregory is  85 y.o. femle with medicl history significnt of colon cncer s/p resection, chronic bck pin s/p lumbr lminectomy nd decompression microdisectomy ,iron deficiency nemi, h/o of oculr mystheni grvis, GERD, hedches, hypertension, hyperlipidemi, mculr degenertion, memory deficits, thorcic neurysm, ws brought in for persistent left hip pin nd unble to ber weight on the left side. She reports pin on the left hip. She couldn't remember how she fell nd I couldn't get detiled history. On tlking to her husbnd, he reports ptient fell "Sundy night nd Mondy night nd ws unble to wlk. There ws no fever or chills.   ED Course: on rrivl to ED, she ws febrile, slightly tchycrdic, hypertensive ,. Lb work reveled hypoklemi, mild leukocytosis of 12.6, hemoglobin of 10.6,. U shows positive nitrite, mny bcteri with moderte leukocytes. CT of the left hip shows cute subcpitl frcture of the left hip demonstrtes mild impction nd nterior displcement of femorl neck.  Pt denies ny dysuri, . Orthopedics consulted nd recommended to trnsfer the ptient to [facility] for hip repir.   She ws referred to medicl service for dmission.   ssessment & Pln:   Principl Problem:   Left displced femorl neck frcture (HCC) ctive Problems:   Hip frcture (HCC)   Preopertive crdiovsculr exmintion   Left Subcpitl Frcture of the Hip:  2/2 mechnicl fll in the bthroom  pprecite orthopedics ssistnce, now s/p hemirthroplsty on 4/9  Crdiology ws c/s for clernce by dmitting provider - pprecite ssistnce, per crds pt t incresed risk, but not prohibitive (wtch lrge fluid volumes nd BP periopertively) Lovenox post op for DVT ppx PT/OT WBT per ortho Will likely need SNF, will discuss with  socil work  Delirium: &Ox1 this M (thought she ws in [place], thought it ws November). Likely 2/2 cute hospitliztion nd surgery. Delirium precutions, follow closely W/u dditionlly s needed  Hypertension: Elevted BP's t dmission Now improving Resume HCTZ Holding losrtn for now Fluctuting, follow  Hyperlipidemi: Crestor  Hypoklemi:  improved  H/o oculr mystheni grvis:  Pt is on chronic steroids, continue the sme.   H/o colon cncer s/p surgicl resection:   UTI: E. Coli sensitive to ncef, nrrowed to keflex  nemi of chronic disese:  Hemoglobin stble round 10.   Prolonged QT; Replce K nd Mgnesium  . Keep K>4 nd mgnesium >2.  Repet EKG in M (pending)  DVT prophylxis: lovenox Code Sttus: full  Fmily Communiction: none t bedside - clled husbnd to updte Disposition Pln: pending PT/OT evl, ortho s/o   Consultnts:   Orthopedics  crdiology  Procedures:   4/9 hemirthroplsty  ntimicrobils:  nti-infectives (From dmission, onwrd)   Strt     Dose/Rte Route Frequency Ordered Stop   03/12/19 1600  cephLEXin (KEFLEX) cpsule 500 mg     50" 0 mg Orl 2 times dily 03/12/19 0809 03/17/19 2159   03/11/19 1800  cefTRIXone (ROCEPHIN) 1 g in sodium chloride 0.9 % 100 mL IVPB  Sttus:  Discontinued     1 g 200 mL/hr over 30 Minutes Intrvenous Every 24 hours 03/10/19 1641 03/10/19 1644   03/11/19 1800  ceFZolin (NCEF) IVPB 2g/100 mL premix     2 g 200 mL/hr over 30 Minutes Intrvenous  Once 03/11/19 1527 03/11/19 2026   03/11/19 1100  ceFZolin (NCEF) IVPB 2g/100 mL premix     2 **Note De-Identified vi Obfusction** g 200 mL/hr over 30 Minutes Intrvenous To ShortSty Surgicl 03/11/19 0450 03/11/19 1208   03/10/19 1800  cefTRIXone (ROCEPHIN) 1 g in sodium chloride 0.9 % 100 mL IVPB  Sttus:  Discontinued     1 g 200 mL/hr over 30 Minutes Intrvenous Every 24 hours 03/10/19 1644 03/12/19 0809     Subjective: Pin ok &Ox1.   Objective: Vitls:   03/12/19 1950 03/12/19 2257 03/13/19 0355 03/13/19 0415  BP: (!) 156/102 116/66 (!) 147/94   Pulse: (!) 117 76 67   Resp: 18  18   Temp: 97.9 F (36.6 C)  98.4 F (36.9 C)   TempSrc: Orl  Orl   SpO2: 91%  (!) 88% 95%  Weight:      Height:        Intke/Output Summry (Lst 24 hours) t 03/13/2019 1222 Lst dt filed t 03/13/2019 0358 Gross per 24 hour  Intke 120 ml  Output 350 ml  Net -230 ml   Filed Weights   03/10/19 1113 03/10/19 1816 03/11/19 1041  Weight: 68 kg 66.9 kg 66.9 kg    Exmintion:  Generl: No cute distress. Crdiovsculr: Hert sounds show  regulr rte, nd rhythm.  Lungs: Cler to usculttion bilterlly . bdomen: Soft, nontender, nondistended  Neurologicl: lert nd oriented 3. Moves ll extremities 4. Crnil nerves II through XII grossly intct. Skin: Wrm nd dry. No rshes or lesions. Extremities: LLE in immobilizer    Dt Reviewed: I hve personlly reviewed following lbs nd imging studies  CBC: Recent Lbs  Lb 03/10/19 1159 03/11/19 0336 03/12/19 0810 03/13/19 0227  WBC 12.6* 9.2 14.9* 12.0*  NEUTROBS 9.5*  --   --   --   HGB 10.6* 9.6* 9.4* 8.6*  HCT 34.0* 32.2* 33.3* 29.2*  MCV 96.3 95.0 101.2* 96.7  PLT 178 164 191 371   Bsic Metbolic Pnel: Recent Lbs  Lb 03/10/19 1159 03/10/19 1200 03/11/19 0336 03/12/19 0810 03/13/19 0227  N 139  --  141 139 140  K 3.3*  --  4.0 4.6 3.7  CL 105  --  106 108 107  CO2 25  --  23 18* 22  GLUCOSE 132*  --  118* 119* 130*  BUN 25*  --  15 19 17   CRETININE 0.74  --  0.54 0.73 0.74  CLCIUM 10.3  --  8.9 8.5* 8.8*  MG  --  2.0  --   --  2.0   GFR: Estimted Cretinine Clernce: 42 mL/min (by [employer] formul bsed on SCr of 0.74 mg/dL). Liver Function Tests: Recent Lbs  Lb 03/11/19 0336 03/13/19 0227  ST 25 28  LT 22 17  LKPHOS 51 58  BILITOT 0.7 0.6  PROT 5.7* 5.6*  LBUMIN 3.3* 3.0*   No results for input(s): LIPSE, MYLSE  in the lst 168 hours. No results for input(s): MMONI in the lst 168 hours. Cogultion Profile: No results for input(s): INR, PROTIME in the lst 168 hours. Crdic Enzymes: No results for input(s): CKTOTL, CKMB, CKMBINDEX, TROPONINI in the lst 168 hours. BNP (lst 3 results) No results for input(s): PROBNP in the lst 8760 hours. Hb1C: No results for input(s): HGB1C in the lst 72 hours. CBG: No results for input(s): GLUCP in the lst 168 hours. Lipid Profile: No results for input(s): CHOL, HDL, LDLCLC, TRIG, CHOLHDL, LDLDIRECT in the lst 72 hours. Thyroid Function Tests: No results for input(s): TSH, T4TOTL, FREET4, T3FREE, THYROIDB in the lst 72 hours. nemi Pnel: No results for input(s): VITMINB12, **Note De-Identified vi Obfusction** FOLTE, FERRITIN, TIBC, IRON, RETICCTPCT in the lst 72 hours. Sepsis Lbs: No results for input(s): PROCLCITON, LTICCIDVEN in the lst 168 hours.  Recent Results (from the pst 240 hour(s))  Culture, Urine     Sttus: bnorml   Collection Time: 03/10/19  4:42 PM  Result Vlue Ref Rnge Sttus   Specimen Description   Finl    URINE, RNDOM Performed t [facility] [address] [place], [place] [address]    Specil Requests   Finl    NONE Performed t [facility], [place] [address], [place], [place] [address]    Culture >=100,000 COLONIES/mL ESCHERICHI COLI ()  Finl   Report Sttus 03/12/2019 FINL  Finl   Orgnism ID, Bcteri ESCHERICHI COLI ()  Finl      Susceptibility   Escherichi coli - MIC*    MPICILLIN 4 SENSITIVE Sensitive     CEFZOLIN <=4 SENSITIVE Sensitive     CEFTRIXONE <=1 SENSITIVE Sensitive     CIPROFLOXCIN <=0.25 SENSITIVE Sensitive     GENTMICIN <=1 SENSITIVE Sensitive     IMIPENEM <=0.25 SENSITIVE Sensitive     NITROFURNTOIN <=16 SENSITIVE Sensitive     TRIMETH/SULF <=20 SENSITIVE Sensitive     MPICILLIN/SULBCTM <=2 SENSITIVE Sensitive     PIP/TZO <=4 SENSITIVE Sensitive      Extended ESBL NEGTIVE Sensitive     * >=100,000 COLONIES/mL ESCHERICHI COLI  Surgicl pcr screen     Sttus: None   Collection Time: 03/10/19  9:54 PM  Result Vlue Ref Rnge Sttus   MRS, PCR NEGTIVE NEGTIVE Finl   Stphylococcus ureus NEGTIVE NEGTIVE Finl    Comment: (NOTE) The Xpert S ssy (FD pproved for NSL specimens in ptients 10 yers of ge nd older), is one component of  comprehensive surveillnce progrm. It is not intended to dignose infection nor to guide or monitor tretment. Performed t [facility], [place] [address], [place], [place] [address]          Rdiology Studies: Pelvis Portble  Result Dte: 03/11/2019 CLINICL DT:  Left hip hemirthroplsty EXM: PORTBLE PELVIS 1-2 VIEWS COMPRISON:  None. FINDINGS: Sttus post hemirthroplsty of the left hip with expected postopertive gs in the soft tissues. lignment is norml. No periprosthetic frcture. Moderte right hip osteorthrosis. IMPRESSION: Expected postopertive ppernce following left hip hemirthroplsty. Electroniclly Signed   By: Ulyses Jrred M.D.   On: 03/11/2019 14:57        Scheduled Meds: . cetminophen  650 mg Orl Q6H  . brimonidine  1 drop Both Eyes TID  . cephLEXin  500 mg Orl BID  . enoxprin (LOVENOX) injection  40 mg Subcutneous Q24H  . fluticsone  1 spry Ech Nre QHS  . hydrochlorothizide  12.5 mg Orl Dily  . ltnoprost  1 drop Left Eye QHS  . polyethylene glycol  17 g Orl BID  . predniSONE  10 mg Orl Q brekfst  . rosuvsttin  5 mg Orl q1800  . senn  1 tblet Orl BID   Continuous Infusions:    LOS: 3 dys    Time spent: over 30 min    Fyrene Helper, MD [facility] MION  If 7PM-7M, plese contct night-coverge [email or web address] Pssword [facility]ced [facility] 03/13/2019, 12:22 PM

## 2019-03-13 NOTE — Progress Notes (Signed)
Subjective: 2 Days Post-Op Procedure(s) (LRB): ARTHROPLASTY BIPOLAR HIP (HEMIARTHROPLASTY) (Left) Patient reports pain as 3 on 0-10 scale.    Objective: Vital signs in last 24 hours: Temp:  [97.9 F (36.6 C)-98.4 F (36.9 C)] 98.4 F (36.9 C) (04/11 0355) Pulse Rate:  [67-117] 67 (04/11 0355) Resp:  [16-24] 18 (04/11 0355) BP: (105-156)/(66-102) 147/94 (04/11 0355) SpO2:  [88 %-95 %] 95 % (04/11 0415)  Intake/Output from previous day: 04/10 0701 - 04/11 0700 In: 240 [P.O.:240] Out: 350 [Urine:350] Intake/Output this shift: No intake/output data recorded.  Recent Labs    03/10/19 1159 03/11/19 0336 03/12/19 0810 03/13/19 0227  HGB 10.6* 9.6* 9.4* 8.6*   Recent Labs    03/12/19 0810 03/13/19 0227  WBC 14.9* 12.0*  RBC 3.29* 3.02*  HCT 33.3* 29.2*  PLT 191 202   Recent Labs    03/12/19 0810 03/13/19 0227  NA 139 140  K 4.6 3.7  CL 108 107  CO2 18* 22  BUN 19 17  CREATININE 0.73 0.74  GLUCOSE 119* 130*  CALCIUM 8.5* 8.8*   No results for input(s): LABPT, INR in the last 72 hours.  Neurovascular intact Sensation intact distally Dorsiflexion/Plantar flexion intact Incision: dressing C/D/I   Assessment/Plan: 2 Days Post-Op Procedure(s) (LRB): ARTHROPLASTY BIPOLAR HIP (HEMIARTHROPLASTY) (Left) Advance diet Up with therapy   Patient stable from orthopedic standpoint but will need skilled nursing placement due to significant physical decondition and immobility.  She is 2+ max assist for bed mobility and unable to ambulate.  She will benefit greatly from skill nursing facility rehab.     Nikolay Demetriou J Clydine Parkison 03/13/2019, 9:08 AM

## 2019-03-13 NOTE — Evaluation (Signed)
Occupational Therapy Evaluation Patient Details Name: Tamara Gregory MRN: [id number] DOB: Jul 27, 1925 Today's Date: 03/13/2019    History of Present Illness 85 y.o. female with medical history significant of colon cancer s/p resection, chronic back pain s/p lumbar laminectomy and decompression microdisectomy ,iron deficiency anemia, h/o of ocular myasthenia gravis, GERD, headaches, hypertension, hyperlipidemia, macular degeneration, memory deficits, thoracic aneurysm, was brought in for persistent left hip pain and unable to bear weight on the left side. CT of the left hip shows Acute subcapital fracture of the left hip demonstrates mild impaction and anterior displacement of femoral neck s/p L hemiarthroplasty 03/11/2019   Clinical Impression   Pt PTA: reports being very active and lives with her spouse, was using w/c recently from pain and spouse assists with ADLs as pt reports very sweaty skin. Pt currently performing ADL functional mobility with modA and RW with maxA for sit to stand and modA for bed mobility. Pt bearing wt on LLE with knee immobilizer. Pt displays set-upA to minA for UB ADL and maxA for LB ADL. Pt tolerating session well and would benefit from continued OT skilled services for ADL,mobility and safety in CIR setting. OT to follow acutely.  Pt reported that she would be okay with either SNF vs CIR. Pt progressing well.      Follow Up Recommendations  CIR;SNF;Supervision/Assistance - 24 hour(pt probably could tolerate CIR)    Equipment Recommendations  3 in 1 bedside commode    Recommendations for Other Services Rehab consult     Precautions / Restrictions Precautions Precautions: Posterior Hip Precaution Booklet Issued: Yes (comment) Precaution Comments: pt able to recall 1/3 precautions at end of session  Restrictions Weight Bearing Restrictions: Yes LLE Weight Bearing: Weight bearing as tolerated      Mobility Bed Mobility Overal bed mobility: Needs  Assistance Bed Mobility: Sidelying to Sit   Sidelying to sit: Mod assist       General bed mobility comments: advancing LLE with assist  Transfers Overall transfer level: Needs assistance Equipment used: Rolling walker (2 wheeled) Transfers: Sit to/from Stand Sit to Stand: Max assist;From elevated surface         General transfer comment: Mod to maxA for sit to stand and able to advance weight on for ambulation to recliner.    Balance Overall balance assessment: Needs assistance Sitting-balance support: Feet supported;No upper extremity supported Sitting balance-Leahy Scale: Fair       Standing balance-Leahy Scale: Poor Standing balance comment: requires UE support on RW                            ADL either performed or assessed with clinical judgement   ADL Overall ADL's : Needs assistance/impaired Eating/Feeding: Set up;Sitting   Grooming: Set up   Upper Body Bathing: Minimal assistance   Lower Body Bathing: Maximal assistance;Total assistance;Sitting/lateral leans;Sit to/from stand   Upper Body Dressing : Minimal assistance;Sitting;Standing   Lower Body Dressing: Maximal assistance;Total assistance;Sitting/lateral leans;Sit to/from stand;+2 for physical assistance;+2 for safety/equipment;Cueing for safety   Toilet Transfer: Maximal assistance;BSC;Ambulation   Toileting- Clothing Manipulation and Hygiene: Maximal assistance;Sit to/from stand;Sitting/lateral lean       Functional mobility during ADLs: Maximal assistance;Rolling walker;Cueing for safety;Cueing for sequencing General ADL Comments: Pt did well with UB ADL and assisted with LB ADL, but maxA for safety with standing and LB ADL. pt using RW to advance steps to transfer to recliner.     Vision Baseline Vision/History: Macular Degeneration  Vision Assessment?: No apparent visual deficits     Perception     Praxis      Pertinent Vitals/Pain Pain Assessment: Faces Faces Pain Scale:  Hurts little more Pain Location: LLE Pain Descriptors / Indicators: Discomfort Pain Intervention(s): Limited activity within patient's tolerance;Monitored during session     Hand Dominance Right   Extremity/Trunk Assessment Upper Extremity Assessment Upper Extremity Assessment: Generalized weakness   Lower Extremity Assessment Lower Extremity Assessment: Defer to PT evaluation;Generalized weakness;LLE deficits/detail LLE Deficits / Details: L LE in knee immobilizer, ankle ROM full    Cervical / Trunk Assessment Cervical / Trunk Assessment: Kyphotic   Communication Communication Communication: No difficulties   Cognition Arousal/Alertness: Awake/alert Behavior During Therapy: WFL for tasks assessed/performed Overall Cognitive Status: Within Functional Limits for tasks assessed                                     General Comments  soreness in LLE.    Exercises     Shoulder Instructions      Home Living Family/patient expects to be discharged to:: Private residence Living Arrangements: Spouse/significant other Available Help at Discharge: Family;Available 24 hours/day(spouse is 91) Type of Home: House Home Access: Stairs to enter;Ramped entrance Entrance Stairs-Number of Steps: 1   Home Layout: One level     Bathroom Shower/Tub: Occupational psychologist: Handicapped height Bathroom Accessibility: Yes How Accessible: Accessible via walker Home Equipment: Clinical cytogeneticist - 2 wheels;Wheelchair - manual          Prior Functioning/Environment Level of Independence: Needs assistance  Gait / Transfers Assistance Needed: use wheelchair or walker for <40 feet ADL's / Homemaking Assistance Needed: husband bathes and dresses her and does iADLS pt reports due to excessive sweat            OT Problem List: Decreased strength;Decreased activity tolerance;Impaired balance (sitting and/or standing);Decreased cognition;Decreased safety  awareness;Pain      OT Treatment/Interventions: Self-care/ADL training;Therapeutic exercise;Neuromuscular education;Energy conservation;Therapeutic activities;Patient/family education;Balance training    OT Goals(Current goals can be found in the care plan section) Acute Rehab OT Goals Patient Stated Goal: go home to be with husband OT Goal Formulation: With patient Time For Goal Achievement: 03/27/19 Potential to Achieve Goals: Good ADL Goals Pt Will Perform Upper Body Dressing: with modified independence;sitting Pt Will Perform Lower Body Dressing: with min assist;with adaptive equipment;sit to/from stand Pt Will Transfer to Toilet: with min guard assist;bedside commode Pt Will Perform Toileting - Clothing Manipulation and hygiene: with min assist;sitting/lateral leans;sit to/from stand Pt/caregiver will Perform Home Exercise Program: Both right and left upper extremity;Increased strength;With written HEP provided;With Supervision Additional ADL Goal #1: pt will perform 5 mins of dynamic standing balance for ADL tasks with RW for support.  OT Frequency: Min 3X/week   Barriers to D/C: Decreased caregiver support  spouse is 81       Co-evaluation              AM-PAC OT "6 Clicks" Daily Activity     Outcome Measure Help from another person eating meals?: None Help from another person taking care of personal grooming?: None Help from another person toileting, which includes using toliet, bedpan, or urinal?: A Little Help from another person bathing (including washing, rinsing, drying)?: A Lot Help from another person to put on and taking off regular upper body clothing?: A Little Help from another person to put on and taking  off regular lower body clothing?: A Lot 6 Click Score: 18   End of Session Equipment Utilized During Treatment: Gait belt;Rolling walker;Left knee immobilizer Nurse Communication: Mobility status  Activity Tolerance: Patient tolerated treatment  well;Patient limited by pain Patient left: in chair;with call bell/phone within reach;with chair alarm set  OT Visit Diagnosis: Unsteadiness on feet (R26.81);Muscle weakness (generalized) (M62.81);Repeated falls (R29.6);Pain Pain - Right/Left: Left Pain - part of body: Leg                Time: 1224-8250 OT Time Calculation (min): 19 min Charges:  OT General Charges $OT Visit: 1 Visit OT Evaluation $OT Eval Moderate Complexity: 1 Mod  Darryl Nestle) Marsa Aris OTR/L Acute Rehabilitation Services Pager: [phone] Office: [place] 03/13/2019, 5:13 PM

## 2019-03-14 LAB — CBC
HCT: 27.5 % — ABNORMAL LOW (ref 36.0–46.0)
Hemoglobin: 8.3 g/dL — ABNORMAL LOW (ref 12.0–15.0)
MCH: 29.1 pg (ref 26.0–34.0)
MCHC: 30.2 g/dL (ref 30.0–36.0)
MCV: 96.5 fL (ref 80.0–100.0)
Platelets: 167 10*3/uL (ref 150–400)
RBC: 2.85 MIL/uL — ABNORMAL LOW (ref 3.87–5.11)
RDW: 16.8 % — ABNORMAL HIGH (ref 11.5–15.5)
WBC: 9.6 10*3/uL (ref 4.0–10.5)
nRBC: 0 % (ref 0.0–0.2)

## 2019-03-14 LAB — BASIC METABOLIC PANEL WITH GFR
Anion gap: 13 (ref 5–15)
BUN: 12 mg/dL (ref 8–23)
CO2: 24 mmol/L (ref 22–32)
Calcium: 8.7 mg/dL — ABNORMAL LOW (ref 8.9–10.3)
Chloride: 103 mmol/L (ref 98–111)
Creatinine, Ser: 0.46 mg/dL (ref 0.44–1.00)
GFR calc Af Amer: >60 mL/min
GFR calc non Af Amer: >60 mL/min
Glucose, Bld: 111 mg/dL — ABNORMAL HIGH (ref 70–99)
Potassium: 3.1 mmol/L — ABNORMAL LOW (ref 3.5–5.1)
Sodium: 140 mmol/L (ref 135–145)

## 2019-03-14 LAB — MAGNESIUM: Magnesium: 1.8 mg/dL (ref 1.7–2.4)

## 2019-03-14 MED ORDER — POTASSIUM CHLORIDE CRYS ER 20 MEQ PO TBCR
40.0000 meq | EXTENDED_RELEASE_TABLET | ORAL | Status: AC
  Administered 2019-03-14 (×2): 40 meq via ORAL
  Filled 2019-03-14 (×2): qty 2

## 2019-03-14 MED ORDER — LOPERAMIDE HCL 2 MG PO CAPS
2.0000 mg | ORAL_CAPSULE | ORAL | Status: DC | PRN
  Administered 2019-03-14 – 2019-03-15 (×2): 2 mg via ORAL
  Filled 2019-03-14 (×2): qty 1

## 2019-03-14 NOTE — Progress Notes (Signed)
Rehab Admissions Coordinator Note:  Per OT recommendation, this patient was screened by Jhonnie Garner for appropriateness for an Inpatient Acute Rehab Consult.  At this time, we are recommending Inpatient Rehab consult. AC will contact MD to request an IP rehab Consult Order.   Jhonnie Garner 03/14/2019, 10:41 AM  I can be reached at [phone].

## 2019-03-14 NOTE — Progress Notes (Addendum)
**Note De-Identified vi Obfusction** PROGRESS NOTE    Tamara Gregory  MHD:[id number] DOB: 03/12/1925 DO: 03/10/2019 PCP: Binnie Ril, MD   Brief Nrrtive:  Per HPI Tamara Gregory is  85 y.o. femle with medicl history significnt of colon cncer s/p resection, chronic bck pin s/p lumbr lminectomy nd decompression microdisectomy ,iron deficiency nemi, h/o of oculr mystheni grvis, GERD, hedches, hypertension, hyperlipidemi, mculr degenertion, memory deficits, thorcic neurysm, ws brought in for persistent left hip pin nd unble to ber weight on the left side. She reports pin on the left hip. She couldn't remember how she fell nd I couldn't get detiled history. On tlking to her husbnd, he reports ptient fell "Sundy night nd Mondy night nd ws unble to wlk. There ws no fever or chills.   ED Course: on rrivl to ED, she ws febrile, slightly tchycrdic, hypertensive ,. Lb work reveled hypoklemi, mild leukocytosis of 12.6, hemoglobin of 10.6,. U shows positive nitrite, mny bcteri with moderte leukocytes. CT of the left hip shows cute subcpitl frcture of the left hip demonstrtes mild impction nd nterior displcement of femorl neck.  Pt denies ny dysuri, . Orthopedics consulted nd recommended to trnsfer the ptient to [facility] for hip repir.   She ws referred to medicl service for dmission.   ssessment & Pln:   Principl Problem:   Left displced femorl neck frcture (HCC) ctive Problems:   Hip frcture (HCC)   Preopertive crdiovsculr exmintion   Left Subcpitl Frcture of the Hip:  2/2 mechnicl fll in the bthroom  pprecite orthopedics ssistnce, now s/p hemirthroplsty on 4/9  Crdiology ws c/s for clernce by dmitting provider - pprecite ssistnce, per crds pt t incresed risk, but not prohibitive (wtch lrge fluid volumes nd BP periopertively) Lovenox post op for DVT ppx PT/OT WBT per ortho Will hve pt evluted for CIR   Delirium: &Ox1 gin this M. Likely 2/2 cute hospitliztion nd surgery. Delirium precutions, follow closely W/u dditionlly s needed  Hypertension: Elevted BP's t dmission Now improving Resume HCTZ Holding losrtn for now Fluctuting, reltively stble tody, follow  Hyperlipidemi: Crestor  Hypoklemi:  improved  H/o oculr mystheni grvis:  Pt is on chronic steroids, continue the sme.   H/o colon cncer s/p surgicl resection:   UTI: E. Coli sensitive to ncef, nrrowed to keflex  nemi of chronic disese:  Slightly downtrending post op, tody reltively stble ~8  Prolonged QT; Replce K nd Mgnesium  . Keep K>4 nd mgnesium >2.  Repet EKG in M (will order gin)  Hypoklemi: replce, follow   DVT prophylxis: lovenox Code Sttus: full  Fmily Communiction: clled to updte husbnd Disposition Pln: pending PT/OT evl, ortho s/o   Consultnts:   Orthopedics  crdiology  Procedures:   4/9 hemirthroplsty  ntimicrobils:  nti-infectives (From dmission, onwrd)   Strt     Dose/Rte Route Frequency Ordered Stop   03/12/19 1600  cephLEXin (KEFLEX) cpsule 500 mg     50" 0 mg Orl 2 times dily 03/12/19 0809 03/17/19 2159   03/11/19 1800  cefTRIXone (ROCEPHIN) 1 g in sodium chloride 0.9 % 100 mL IVPB  Sttus:  Discontinued     1 g 200 mL/hr over 30 Minutes Intrvenous Every 24 hours 03/10/19 1641 03/10/19 1644   03/11/19 1800  ceFZolin (NCEF) IVPB 2g/100 mL premix     2 g 200 mL/hr over 30 Minutes Intrvenous  Once 03/11/19 1527 03/11/19 2026   03/11/19 1100  ceFZolin (NCEF) IVPB 2g/100 mL premix     2 g 200 **Note De-Identified vi Obfusction** mL/hr over 30 Minutes Intrvenous To ShortSty Surgicl 03/11/19 0450 03/11/19 1208   03/10/19 1800  cefTRIXone (ROCEPHIN) 1 g in sodium chloride 0.9 % 100 mL IVPB  Sttus:  Discontinued     1 g 200 mL/hr over 30 Minutes Intrvenous Every 24 hours 03/10/19 1644 03/12/19 0809     Subjective: Pin ok.  Still confused.  &Ox1  Objective: Vitls:   03/13/19 0415 03/13/19 1357 03/13/19 2015 03/14/19 0432  BP:  135/85 139/67 134/70  Pulse:  91 (!) 105 (!) 101  Resp:  19 18 19   Temp:  97.7 F (36.5 C) 98.3 F (36.8 C) 98 F (36.7 C)  TempSrc:  Orl Orl Orl  SpO2: 95% 98% 96% 97%  Weight:      Height:        Intke/Output Summry (Lst 24 hours) t 03/14/2019 1100 Lst dt filed t 03/14/2019 0506 Gross per 24 hour  Intke 210 ml  Output 500 ml  Net -290 ml   Filed Weights   03/10/19 1113 03/10/19 1816 03/11/19 1041  Weight: 68 kg 66.9 kg 66.9 kg    Exmintion:  Generl: No cute distress. Crdiovsculr: Hert sounds show  regulr rte, nd rhythm.  Lungs: Cler to usculttion bilterlly bdomen: Soft, nontender, nondistended  Neurologicl: lert nd oriented 1. Moves ll extremities 4. Crnil nerves II through XII grossly intct. Skin: Wrm nd dry. No rshes or lesions. Extremities: LLE in immobilizer    Dt Reviewed: I hve personlly reviewed following lbs nd imging studies  CBC: Recent Lbs  Lb 03/10/19 1159 03/11/19 0336 03/12/19 0810 03/13/19 0227 03/14/19 0331  WBC 12.6* 9.2 14.9* 12.0* 9.6  NEUTROBS 9.5*  --   --   --   --   HGB 10.6* 9.6* 9.4* 8.6* 8.3*  HCT 34.0* 32.2* 33.3* 29.2* 27.5*  MCV 96.3 95.0 101.2* 96.7 96.5  PLT 178 164 191 202 099   Bsic Metbolic Pnel: Recent Lbs  Lb 03/10/19 1159 03/10/19 1200 03/11/19 0336 03/12/19 0810 03/13/19 0227 03/14/19 0331  N 139  --  141 139 140 140  K 3.3*  --  4.0 4.6 3.7 3.1*  CL 105  --  106 108 107 103  CO2 25  --  23 18* 22 24  GLUCOSE 132*  --  118* 119* 130* 111*  BUN 25*  --  15 19 17 12   CRETININE 0.74  --  0.54 0.73 0.74 0.46  CLCIUM 10.3  --  8.9 8.5* 8.8* 8.7*  MG  --  2.0  --   --  2.0 1.8   GFR: Estimted Cretinine Clernce: 42 mL/min (by [employer] formul bsed on SCr of 0.46 mg/dL). Liver Function Tests: Recent Lbs  Lb 03/11/19 0336 03/13/19 0227   ST 25 28  LT 22 17  LKPHOS 51 58  BILITOT 0.7 0.6  PROT 5.7* 5.6*  LBUMIN 3.3* 3.0*   No results for input(s): LIPSE, MYLSE in the lst 168 hours. No results for input(s): MMONI in the lst 168 hours. Cogultion Profile: No results for input(s): INR, PROTIME in the lst 168 hours. Crdic Enzymes: No results for input(s): CKTOTL, CKMB, CKMBINDEX, TROPONINI in the lst 168 hours. BNP (lst 3 results) No results for input(s): PROBNP in the lst 8760 hours. Hb1C: No results for input(s): HGB1C in the lst 72 hours. CBG: No results for input(s): GLUCP in the lst 168 hours. Lipid Profile: No results for input(s): CHOL, HDL, LDLCLC, TRIG, CHOLHDL, LDLDIRECT in the lst 72 hours. Thyroid Function **Note De-Identified vi Obfusction** Tests: No results for input(s): TSH, T4TOTL, FREET4, T3FREE, THYROIDB in the lst 72 hours. nemi Pnel: No results for input(s): VITMINB12, FOLTE, FERRITIN, TIBC, IRON, RETICCTPCT in the lst 72 hours. Sepsis Lbs: No results for input(s): PROCLCITON, LTICCIDVEN in the lst 168 hours.  Recent Results (from the pst 240 hour(s))  Culture, Urine     Sttus: bnorml   Collection Time: 03/10/19  4:42 PM  Result Vlue Ref Rnge Sttus   Specimen Description   Finl    URINE, RNDOM Performed t [facility] [address], [place], [place] [address]    Specil Requests   Finl    NONE Performed t [facility], [place] [address] [place], [place] [address]    Culture >=100,000 COLONIES/mL ESCHERICHI COLI ()  Finl   Report Sttus 03/12/2019 FINL  Finl   Orgnism ID, Bcteri ESCHERICHI COLI ()  Finl      Susceptibility   Escherichi coli - MIC*    MPICILLIN 4 SENSITIVE Sensitive     CEFZOLIN <=4 SENSITIVE Sensitive     CEFTRIXONE <=1 SENSITIVE Sensitive     CIPROFLOXCIN <=0.25 SENSITIVE Sensitive     GENTMICIN <=1 SENSITIVE Sensitive     IMIPENEM <=0.25 SENSITIVE Sensitive     NITROFURNTOIN <=16 SENSITIVE  Sensitive     TRIMETH/SULF <=20 SENSITIVE Sensitive     MPICILLIN/SULBCTM <=2 SENSITIVE Sensitive     PIP/TZO <=4 SENSITIVE Sensitive     Extended ESBL NEGTIVE Sensitive     * >=100,000 COLONIES/mL ESCHERICHI COLI  Surgicl pcr screen     Sttus: None   Collection Time: 03/10/19  9:54 PM  Result Vlue Ref Rnge Sttus   MRS, PCR NEGTIVE NEGTIVE Finl   Stphylococcus ureus NEGTIVE NEGTIVE Finl    Comment: (NOTE) The Xpert S ssy (FD pproved for NSL specimens in ptients 60 yers of ge nd older), is one component of  comprehensive surveillnce progrm. It is not intended to dignose infection nor to guide or monitor tretment. Performed t [facility] [address] [place], [place] [address]          Rdiology Studies: No results found.      Scheduled Meds: . cetminophen  650 mg Orl Q6H  . brimonidine  1 drop Both Eyes TID  . cephLEXin  500 mg Orl BID  . enoxprin (LOVENOX) injection  40 mg Subcutneous Q24H  . fluticsone  1 spry Ech Nre QHS  . hydrochlorothizide  12.5 mg Orl Dily  . ltnoprost  1 drop Left Eye QHS  . polyethylene glycol  17 g Orl BID  . potssium chloride  40 mEq Orl Q4H  . predniSONE  10 mg Orl Q brekfst  . rosuvsttin  5 mg Orl q1800  . senn  1 tblet Orl BID   Continuous Infusions:    LOS: 4 dys    Time spent: over 30 min    Fyrene Helper, MD [facility] M[facility]  If 7PM-7M, plese contct night-coverge [email or web address] Pssword [email or web address] 03/14/2019, 11:00 M

## 2019-03-14 NOTE — Plan of Care (Signed)

## 2019-03-15 ENCOUNTER — Other Ambulatory Visit: Payer: Self-pay

## 2019-03-15 ENCOUNTER — Inpatient Hospital Stay (HOSPITAL_COMMUNITY)
Admission: RE | Admit: 2019-03-15 | Discharge: 2019-03-29 | DRG: 560 | Disposition: A | Discharge status: Home Health | Payer: Medicare Other | Source: Intra-hospital | Attending: Physical Medicine & Rehabilitation | Admitting: Physical Medicine & Rehabilitation

## 2019-03-15 ENCOUNTER — Encounter (HOSPITAL_COMMUNITY): Payer: Self-pay

## 2019-03-15 DIAGNOSIS — K219 Gastro-esophageal reflux disease without esophagitis: Secondary | ICD-10-CM | POA: Diagnosis present

## 2019-03-15 DIAGNOSIS — D72829 Elevated white blood cell count, unspecified: Secondary | ICD-10-CM

## 2019-03-15 DIAGNOSIS — T380X5A Adverse effect of glucocorticoids and synthetic analogues, initial encounter: Secondary | ICD-10-CM | POA: Diagnosis not present

## 2019-03-15 DIAGNOSIS — F419 Anxiety disorder, unspecified: Secondary | ICD-10-CM | POA: Diagnosis present

## 2019-03-15 DIAGNOSIS — Z885 Allergy status to narcotic agent status: Secondary | ICD-10-CM

## 2019-03-15 DIAGNOSIS — S72012D Unspecified intracapsular fracture of left femur, subsequent encounter for closed fracture with routine healing: Principal | ICD-10-CM

## 2019-03-15 DIAGNOSIS — G7000 Myasthenia gravis without (acute) exacerbation: Secondary | ICD-10-CM | POA: Diagnosis not present

## 2019-03-15 DIAGNOSIS — S72002A Fracture of unspecified part of neck of left femur, initial encounter for closed fracture: Secondary | ICD-10-CM

## 2019-03-15 DIAGNOSIS — Z85828 Personal history of other malignant neoplasm of skin: Secondary | ICD-10-CM | POA: Diagnosis not present

## 2019-03-15 DIAGNOSIS — E559 Vitamin D deficiency, unspecified: Secondary | ICD-10-CM | POA: Diagnosis present

## 2019-03-15 DIAGNOSIS — Z8249 Family history of ischemic heart disease and other diseases of the circulatory system: Secondary | ICD-10-CM | POA: Diagnosis not present

## 2019-03-15 DIAGNOSIS — G8929 Other chronic pain: Secondary | ICD-10-CM | POA: Diagnosis not present

## 2019-03-15 DIAGNOSIS — M549 Dorsalgia, unspecified: Secondary | ICD-10-CM | POA: Diagnosis not present

## 2019-03-15 DIAGNOSIS — Z85038 Personal history of other malignant neoplasm of large intestine: Secondary | ICD-10-CM | POA: Diagnosis not present

## 2019-03-15 DIAGNOSIS — R7309 Other abnormal glucose: Secondary | ICD-10-CM

## 2019-03-15 DIAGNOSIS — H409 Unspecified glaucoma: Secondary | ICD-10-CM | POA: Diagnosis present

## 2019-03-15 DIAGNOSIS — Z803 Family history of malignant neoplasm of breast: Secondary | ICD-10-CM | POA: Diagnosis not present

## 2019-03-15 DIAGNOSIS — D649 Anemia, unspecified: Secondary | ICD-10-CM

## 2019-03-15 DIAGNOSIS — Z801 Family history of malignant neoplasm of trachea, bronchus and lung: Secondary | ICD-10-CM | POA: Diagnosis not present

## 2019-03-15 DIAGNOSIS — R0989 Other specified symptoms and signs involving the circulatory and respiratory systems: Secondary | ICD-10-CM | POA: Diagnosis not present

## 2019-03-15 DIAGNOSIS — G43909 Migraine, unspecified, not intractable, without status migrainosus: Secondary | ICD-10-CM | POA: Diagnosis present

## 2019-03-15 DIAGNOSIS — Z800 Family history of malignant neoplasm of digestive organs: Secondary | ICD-10-CM

## 2019-03-15 DIAGNOSIS — G8918 Other acute postprocedural pain: Secondary | ICD-10-CM

## 2019-03-15 DIAGNOSIS — M6281 Muscle weakness (generalized): Secondary | ICD-10-CM | POA: Diagnosis not present

## 2019-03-15 DIAGNOSIS — M1990 Unspecified osteoarthritis, unspecified site: Secondary | ICD-10-CM | POA: Diagnosis present

## 2019-03-15 DIAGNOSIS — Z7982 Long term (current) use of aspirin: Secondary | ICD-10-CM

## 2019-03-15 DIAGNOSIS — K5900 Constipation, unspecified: Secondary | ICD-10-CM | POA: Diagnosis present

## 2019-03-15 DIAGNOSIS — N390 Urinary tract infection, site not specified: Secondary | ICD-10-CM | POA: Diagnosis present

## 2019-03-15 DIAGNOSIS — Z7952 Long term (current) use of systemic steroids: Secondary | ICD-10-CM

## 2019-03-15 DIAGNOSIS — Z96642 Presence of left artificial hip joint: Secondary | ICD-10-CM | POA: Diagnosis not present

## 2019-03-15 DIAGNOSIS — D72828 Other elevated white blood cell count: Secondary | ICD-10-CM | POA: Diagnosis not present

## 2019-03-15 DIAGNOSIS — I10 Essential (primary) hypertension: Secondary | ICD-10-CM | POA: Diagnosis present

## 2019-03-15 DIAGNOSIS — R739 Hyperglycemia, unspecified: Secondary | ICD-10-CM | POA: Diagnosis not present

## 2019-03-15 DIAGNOSIS — D62 Acute posthemorrhagic anemia: Secondary | ICD-10-CM | POA: Diagnosis not present

## 2019-03-15 DIAGNOSIS — B9620 Unspecified Escherichia coli [E. coli] as the cause of diseases classified elsewhere: Secondary | ICD-10-CM | POA: Diagnosis not present

## 2019-03-15 DIAGNOSIS — E46 Unspecified protein-calorie malnutrition: Secondary | ICD-10-CM | POA: Diagnosis not present

## 2019-03-15 DIAGNOSIS — E876 Hypokalemia: Secondary | ICD-10-CM | POA: Diagnosis not present

## 2019-03-15 DIAGNOSIS — E8809 Other disorders of plasma-protein metabolism, not elsewhere classified: Secondary | ICD-10-CM

## 2019-03-15 DIAGNOSIS — Z7951 Long term (current) use of inhaled steroids: Secondary | ICD-10-CM

## 2019-03-15 DIAGNOSIS — E785 Hyperlipidemia, unspecified: Secondary | ICD-10-CM | POA: Diagnosis present

## 2019-03-15 DIAGNOSIS — M7989 Other specified soft tissue disorders: Secondary | ICD-10-CM | POA: Diagnosis not present

## 2019-03-15 DIAGNOSIS — R000 Tachycardia, unspecified: Secondary | ICD-10-CM | POA: Diagnosis not present

## 2019-03-15 DIAGNOSIS — H3530 Unspecified macular degeneration: Secondary | ICD-10-CM | POA: Diagnosis present

## 2019-03-15 DIAGNOSIS — Z833 Family history of diabetes mellitus: Secondary | ICD-10-CM

## 2019-03-15 LAB — COMPREHENSIVE METABOLIC PANEL
ALT: 20 U/L (ref 0–44)
AST: 22 U/L (ref 15–41)
Albumin: 2.7 g/dL — ABNORMAL LOW (ref 3.5–5.0)
Alkaline Phosphatase: 58 U/L (ref 38–126)
Anion gap: 11 (ref 5–15)
BUN: 12 mg/dL (ref 8–23)
CO2: 26 mmol/L (ref 22–32)
Calcium: 8.9 mg/dL (ref 8.9–10.3)
Chloride: 105 mmol/L (ref 98–111)
Creatinine, Ser: 0.48 mg/dL (ref 0.44–1.00)
GFR calc Af Amer: >60 mL/min (ref >60)
GFR calc non Af Amer: >60 mL/min (ref >60)
Glucose, Bld: 102 mg/dL — ABNORMAL HIGH (ref 70–99)
Potassium: 4.1 mmol/L (ref 3.5–5.1)
Sodium: 142 mmol/L (ref 135–145)
Total Bilirubin: 0.6 mg/dL (ref 0.3–1.2)
Total Protein: 5.4 g/dL — ABNORMAL LOW (ref 6.5–8.1)

## 2019-03-15 LAB — CBC
HCT: 28.4 % — ABNORMAL LOW (ref 36.0–46.0)
Hemoglobin: 8.7 g/dL — ABNORMAL LOW (ref 12.0–15.0)
MCH: 29.3 pg (ref 26.0–34.0)
MCHC: 30.6 g/dL (ref 30.0–36.0)
MCV: 95.6 fL (ref 80.0–100.0)
Platelets: 192 10*3/uL (ref 150–400)
RBC: 2.97 MIL/uL — ABNORMAL LOW (ref 3.87–5.11)
RDW: 16.5 % — ABNORMAL HIGH (ref 11.5–15.5)
WBC: 9.6 10*3/uL (ref 4.0–10.5)
nRBC: 0.2 % (ref 0.0–0.2)

## 2019-03-15 LAB — MAGNESIUM: Magnesium: 1.9 mg/dL (ref 1.7–2.4)

## 2019-03-15 MED ORDER — POLYETHYLENE GLYCOL 3350 17 G PO PACK
17.0000 g | PACK | Freq: Two times a day (BID) | ORAL | Status: DC
  Administered 2019-03-15 – 2019-03-24 (×6): 17 g via ORAL
  Filled 2019-03-15 (×20): qty 1

## 2019-03-15 MED ORDER — PREDNISONE 10 MG PO TABS: 10.0000 mg | ORAL_TABLET | Freq: Every day | ORAL | 0 refills | Status: AC

## 2019-03-15 MED ORDER — FLUTICASONE PROPIONATE 50 MCG/ACT NA SUSP
1.0000 | Freq: Every day | NASAL | Status: DC
  Administered 2019-03-16 – 2019-03-28 (×14): 1 via NASAL
  Filled 2019-03-15 (×2): qty 16

## 2019-03-15 MED ORDER — CEPHALEXIN 500 MG PO CAPS: 500.0000 mg | ORAL_CAPSULE | Freq: Two times a day (BID) | ORAL | 0 refills | Status: DC

## 2019-03-15 MED ORDER — CEPHALEXIN 250 MG PO CAPS
500.0000 mg | ORAL_CAPSULE | Freq: Two times a day (BID) | ORAL | Status: AC
  Administered 2019-03-15 – 2019-03-17 (×4): 500 mg via ORAL
  Filled 2019-03-15 (×4): qty 2

## 2019-03-15 MED ORDER — ONDANSETRON HCL 4 MG/2ML IJ SOLN: 4.0000 mg | Freq: Four times a day (QID) | INTRAMUSCULAR | Status: DC | PRN

## 2019-03-15 MED ORDER — ROSUVASTATIN CALCIUM 5 MG PO TABS
5.0000 mg | ORAL_TABLET | Freq: Every day | ORAL | Status: DC
  Administered 2019-03-15 – 2019-03-28 (×14): 5 mg via ORAL
  Filled 2019-03-15 (×14): qty 1

## 2019-03-15 MED ORDER — SENNA 8.6 MG PO TABS
1.0000 | ORAL_TABLET | Freq: Two times a day (BID) | ORAL | Status: DC
  Administered 2019-03-15 – 2019-03-24 (×5): 8.6 mg via ORAL
  Filled 2019-03-15 (×15): qty 1

## 2019-03-15 MED ORDER — LATANOPROST 0.005 % OP SOLN
1.0000 [drp] | Freq: Every day | OPHTHALMIC | Status: DC
  Administered 2019-03-16 – 2019-03-28 (×14): 1 [drp] via OPHTHALMIC
  Filled 2019-03-15 (×3): qty 2.5

## 2019-03-15 MED ORDER — SORBITOL 70 % SOLN
30.0000 mL | Freq: Every day | Status: DC | PRN
  Administered 2019-03-16: 30 mL via ORAL
  Filled 2019-03-15: qty 30

## 2019-03-15 MED ORDER — BISACODYL 10 MG RE SUPP: 10.0000 mg | Freq: Every day | RECTAL | Status: DC | PRN

## 2019-03-15 MED ORDER — BRIMONIDINE TARTRATE 0.2 % OP SOLN
1.0000 [drp] | Freq: Three times a day (TID) | OPHTHALMIC | Status: DC
  Administered 2019-03-16 – 2019-03-29 (×39): 1 [drp] via OPHTHALMIC
  Filled 2019-03-15 (×3): qty 5

## 2019-03-15 MED ORDER — ENOXAPARIN SODIUM 40 MG/0.4ML ~~LOC~~ SOLN: 40.0000 mg | SUBCUTANEOUS | Status: DC

## 2019-03-15 MED ORDER — HYDROCHLOROTHIAZIDE 12.5 MG PO CAPS
12.5000 mg | ORAL_CAPSULE | Freq: Every day | ORAL | Status: DC
  Administered 2019-03-16 – 2019-03-29 (×14): 12.5 mg via ORAL
  Filled 2019-03-15 (×14): qty 1

## 2019-03-15 MED ORDER — PREDNISONE 10 MG PO TABS
10.0000 mg | ORAL_TABLET | Freq: Every day | ORAL | Status: DC
  Administered 2019-03-16 – 2019-03-29 (×14): 10 mg via ORAL
  Filled 2019-03-15 (×14): qty 1

## 2019-03-15 MED ORDER — HYDROCODONE-ACETAMINOPHEN 5-325 MG PO TABS
1.0000 | ORAL_TABLET | Freq: Four times a day (QID) | ORAL | Status: DC | PRN
  Administered 2019-03-15 – 2019-03-28 (×14): 1 via ORAL
  Filled 2019-03-15 (×17): qty 1

## 2019-03-15 MED ORDER — ENOXAPARIN SODIUM 40 MG/0.4ML ~~LOC~~ SOLN
40.0000 mg | SUBCUTANEOUS | Status: DC
  Administered 2019-03-15 – 2019-03-28 (×14): 40 mg via SUBCUTANEOUS
  Filled 2019-03-15 (×14): qty 0.4

## 2019-03-15 MED ORDER — ONDANSETRON HCL 4 MG PO TABS
4.0000 mg | ORAL_TABLET | Freq: Four times a day (QID) | ORAL | Status: DC | PRN
  Administered 2019-03-19 – 2019-03-27 (×5): 4 mg via ORAL
  Filled 2019-03-15 (×6): qty 1

## 2019-03-15 NOTE — Progress Notes (Signed)
Inpatient Rehabilitation Admissions Coordinator  Inpatient Rehab Consult received. I met with patient  at the bedside for rehabilitation assessment. We discussed goals and expectations of an inpatient rehab admission.  She reluctantly agreed to admission for she knows her son and spouse want her to remain here at [facility] inpt rehab . I will begin insurance approval and check bed availability for possible admit today. I spoke with both her son, [name] and Spouse by phone and they are in agreement. I will follow up today.  Danne Baxter, RN, MSN Rehab Admissions Coordinator [phone] 03/15/2019 11:18 AM

## 2019-03-15 NOTE — Progress Notes (Signed)
Inpatient Rehabilitation Admissions Coordinator  I have insurance approval and bed available to admit pt to inpt rehab today. Pt, son and spouse aware and in agreement. I have notified Dr., Florene Glen and SW, Rembrandt. I will make the arrangements to admit today.  Danne Baxter, RN, MSN Rehab Admissions Coordinator [phone] 03/15/2019 1:45 PM

## 2019-03-15 NOTE — Progress Notes (Signed)
Physical Therapy Treatment Patient Details Name: Tamara Gregory MRN: [id number] DOB: 11-Mar-1925 Today's Date: 03/15/2019    History of Present Illness 85 y.o. female with medical history significant of colon cancer s/p resection, chronic back pain s/p lumbar laminectomy and decompression microdisectomy ,iron deficiency anemia, h/o of ocular myasthenia gravis, GERD, headaches, hypertension, hyperlipidemia, macular degeneration, memory deficits, thoracic aneurysm, was brought in for persistent left hip pain and unable to bear weight on the left side. CT of the left hip shows Acute subcapital fracture of the left hip demonstrates mild impaction and anterior displacement of femoral neck s/p L hemiarthroplasty 03/11/2019    PT Comments    Patient progressing with mobility able to stand for pivot to chair with stedy.  Currently appropriate for potential CIR stay prior to home with assist of family.  Patient reminded of hip precautions, but did not remember why she had a brace and needs cues for remembering having been up in chair previously.  PT to follow acutely.    Follow Up Recommendations  CIR     Equipment Recommendations  Other (comment)(TBA)    Recommendations for Other Services Rehab consult     Precautions / Restrictions Precautions Precautions: Posterior Hip Restrictions LLE Weight Bearing: Weight bearing as tolerated    Mobility  Bed Mobility Overal bed mobility: Needs Assistance Bed Mobility: Supine to Sit   Sidelying to sit: Mod assist       General bed mobility comments: cues for technique to push with R LE and assist for L LE and for trunk  Transfers Overall transfer level: Needs assistance   Transfers: Sit to/from Stand Sit to Stand: Mod assist;+2 physical assistance         General transfer comment: lifting assist to stand at stedy, assisted to pivot to chair on stedy, but maintained standing throughout transfer with assist for balance  Ambulation/Gait                  Stairs             Wheelchair Mobility    Modified Rankin (Stroke Patients Only)       Balance Overall balance assessment: Needs assistance Sitting-balance support: Feet supported Sitting balance-Leahy Scale: Poor Sitting balance - Comments: initially unsupported, but then with LOB posterior turning to look at MD who entered the room   Standing balance support: Bilateral upper extremity supported Standing balance-Leahy Scale: Poor Standing balance comment: UE support and assist for balance                            Cognition Arousal/Alertness: Awake/alert Behavior During Therapy: WFL for tasks assessed/performed Overall Cognitive Status: No family/caregiver present to determine baseline cognitive functioning                                        Exercises General Exercises - Lower Extremity Ankle Circles/Pumps: AROM;10 reps;Both;Supine Short Arc Quad: AROM;10 reps;Left;Supine Heel Slides: AROM;AAROM;Left;10 reps;Supine Hip ABduction/ADduction: AROM;AAROM;10 reps;Left;Supine    General Comments General comments (skin integrity, edema, etc.): c/o sore on bottom due to couple of days of frequent stools, applied barrier cream while standing      Pertinent Vitals/Pain Pain Assessment: Faces Faces Pain Scale: Hurts little more Pain Location: L LE with supine to sit Pain Descriptors / Indicators: Discomfort Pain Intervention(s): Monitored during session;Repositioned    Home Living  Prior Function            PT Goals (current goals can now be found in the care plan section) Progress towards PT goals: Progressing toward goals    Frequency    Min 3X/week      PT Plan Discharge plan needs to be updated    Co-evaluation              AM-PAC PT "6 Clicks" Mobility   Outcome Measure  Help needed turning from your back to your side while in a flat bed without using  bedrails?: A Lot Help needed moving from lying on your back to sitting on the side of a flat bed without using bedrails?: A Lot Help needed moving to and from a bed to a chair (including a wheelchair)?: Total Help needed standing up from a chair using your arms (e.g., wheelchair or bedside chair)?: Total Help needed to walk in hospital room?: Total Help needed climbing 3-5 steps with a railing? : Total 6 Click Score: 8    End of Session Equipment Utilized During Treatment: Gait belt Activity Tolerance: Patient tolerated treatment well Patient left: with call bell/phone within reach;in chair;with chair alarm set Nurse Communication: Need for lift equipment PT Visit Diagnosis: Unsteadiness on feet (R26.81);Other abnormalities of gait and mobility (R26.89);History of falling (Z91.81);Muscle weakness (generalized) (M62.81);Difficulty in walking, not elsewhere classified (R26.2);Pain Pain - Right/Left: Left Pain - part of body: Hip     Time: 2458-0998 PT Time Calculation (min) (ACUTE ONLY): 34 min  Charges:  $Therapeutic Exercise: 8-22 mins $Therapeutic Activity: 8-22 mins                     Magda Kiel, [place] Acute Rehabilitation Services [phone] 03/15/2019    Reginia Naas 03/15/2019, 12:24 PM

## 2019-03-15 NOTE — Progress Notes (Signed)
[name], MD  Physician  Physical Medicine and Rehabilitation  PMR Pre-admission  Signed  Date of Service:  03/15/2019 2:21 PM       Related encounter: ED to Hosp-Admission (Current) from 03/10/2019 in [facility]         Show:Clear all '[x]' Manual'[x]' Template'[x]' Copied  Added by: '[x]' Cristina Gong, RN'[x]' [name], MD  '[]' Hover for details PMR Admission Coordinator Pre-Admission Assessment  Patient: Tamara Gregory is an 85 y.o., female MRN: [id number] DOB: Jun 15, 1925 Height: '5\' 6"'  (167.6 cm) Weight: 66.9 kg  Insurance Information HMO: yes    PPO:      PCP:      IPA:      80/20:      OTHER: medicare advantage plan PRIMARY: [place]      Policy#: [id number]      Subscriber: pt CM Name: Margaretha Sheffield      Phone#: [phone]     Fax#: [phone] Pre-Cert#: [id number] for 7 days with f/u Sharee Pimple      Employer: retired Benefits:  Phone #: [phone]     Name: 4/13 Eff. Date: 12/02/2018     Deduct: none      Out of Pocket Max: $3600      Life Max: none CIR: $295 co pay per day days 1 until 5      SNF: no co pay days 1 until 20; $160 co pay per day days 21 until 43; no co pay per day days 44 until 100 Outpatient: $30 co pay per visit     Co-Pay: visits per medical neccesity Home Health: 100%      Co-Pay: visits per medical neccesity DME: 80%     Co-Pay: 20% Providers: in network  SECONDARY: none        Medicaid Application Date:       Case Manager:  Disability Application Date:       Case Worker:   The "Data Collection Information Summary" for patients in Inpatient Rehabilitation Facilities with attached "Privacy Act [place] Records" was provided and verbally reviewed with: Family  Emergency Contact Information         Contact Information    Name Relation Home Work [facility] [address] Raechel Chute [phone]  [phone]   Cyenna, Rebello   [phone]       Current Medical History  Patient Admitting Diagnosis: left femoral neck fracture  History of Present Illness:Tamara Gregory is a 85 year old right-handed female with history of Colon cancer with resection,Chronic back pain followed by Dr. Maryjean Ka with spinal cord stimulator, hypertension, hyperlipidemia,ocular myasthenia gravis. Presented 03/10/2019 with fall 2 without loss of consciousness. Reports of left hip pain.CT of the hip showed acute subcapital fracture left hip mild impaction and anterior displacement of femoral neck. Cranial CT scan was negative.underwent left hip bipolar hip hemiarthroplasty 03/11/2019 per Dr. Percell Miller. Weightbearing as tolerated with posterior hip precautions. Hospital course pain management.Acute blood loss anemia 8.7. subcutaneous Lovenox for DVT prophylaxis. Escherichia coli UTI completing a course of Keflex.   Patient's medical record from [facility] has been reviewed by the rehabilitation admission coordinator and physician.  Past Medical History      Past Medical History:  Diagnosis Date  . Anemia   . Anxiety   . Arthritis   . Cancer (HCC)    hx of skin cancer , colon   . Cataract    removed both  eyes   . Chronic back pain    okay with sitting, increased back pain with activity   . Depression   . GERD (gastroesophageal reflux disease)   . Glaucoma   . Headache(784.0)    hx of  . History of kidney stones   . History of migraines   . Hyperlipidemia   . Hypertension   . Macular degeneration   . Myasthenia gravis ([place])   . Occasional tremors   . Pneumonia   . Spinal stenosis   . Thoracic aneurysm    4.4cm; see CT done 01/28/12 and 01/22/13 in [facility].   [name] Uses hearing aid   . Vitamin D deficiency     Family History   family history includes Breast cancer in her sister; Cancer in her brother, sister, and sister; Diabetes in her father and sister; Heart attack in her father; Liver cancer in her sister;  Lung cancer in her brother.  Prior Rehab/Hospitalizations Has the patient had prior rehab or hospitalizations prior to admission? Yes  Has the patient had major surgery during 100 days prior to admission? Yes             Current Medications  Current Facility-Administered Medications:  .  acetaminophen (TYLENOL) tablet 650 mg, 650 mg, Oral, Q6H, Martensen, Charna Elizabeth III, PA-C, 650 mg at 03/15/19 1206 .  bisacodyl (DULCOLAX) suppository 10 mg, 10 mg, Rectal, Daily PRN, Martensen, Charna Elizabeth III, PA-C .  brimonidine (ALPHAGAN) 0.2 % ophthalmic solution 1 drop, 1 drop, Both Eyes, TID, Martensen, Charna Elizabeth III, PA-C, 1 drop at 03/15/19 1033 .  cephALEXin (KEFLEX) capsule 500 mg, 500 mg, Oral, BID, Elodia Florence., MD, 500 mg at 03/15/19 1033 .  enoxaparin (LOVENOX) injection 40 mg, 40 mg, Subcutaneous, Q24H, Martensen, Charna Elizabeth III, PA-C, 40 mg at 03/14/19 1706 .  fluticasone (FLONASE) 50 MCG/ACT nasal spray 1 spray, 1 spray, Each Nare, QHS, Martensen, Charna Elizabeth III, PA-C, 1 spray at 03/14/19 2105 .  hydrochlorothiazide (MICROZIDE) capsule 12.5 mg, 12.5 mg, Oral, Daily, Elodia Florence., MD, 12.5 mg at 03/15/19 1033 .  HYDROcodone-acetaminophen (NORCO/VICODIN) 5-325 MG per tablet 1 tablet, 1 tablet, Oral, Q6H PRN, Prudencio Burly III, PA-C, 1 tablet at 03/15/19 1033 .  HYDROmorphone (DILAUDID) injection 0.25-0.5 mg, 0.25-0.5 mg, Intravenous, Q4H PRN, Martensen, Charna Elizabeth III, PA-C .  latanoprost (XALATAN) 0.005 % ophthalmic solution 1 drop, 1 drop, Left Eye, QHS, Martensen, Charna Elizabeth III, PA-C, 1 drop at 03/14/19 2105 .  loperamide (IMODIUM) capsule 2 mg, 2 mg, Oral, PRN, Elodia Florence., MD, 2 mg at 03/15/19 1042 .  metoCLOPramide (REGLAN) tablet 5-10 mg, 5-10 mg, Oral, Q8H PRN **OR** metoCLOPramide (REGLAN) injection 5-10 mg, 5-10 mg, Intravenous, Q8H PRN, Martensen, Charna Elizabeth III, PA-C .  ondansetron (ZOFRAN) tablet 4 mg, 4 mg, Oral, Q6H  PRN, 4 mg at 03/15/19 1042 **OR** ondansetron (ZOFRAN) injection 4 mg, 4 mg, Intravenous, Q6H PRN, Martensen, Charna Elizabeth III, PA-C .  polyethylene glycol (MIRALAX / GLYCOLAX) packet 17 g, 17 g, Oral, BID, Elodia Florence., MD, 17 g at 03/14/19 0941 .  predniSONE (DELTASONE) tablet 10 mg, 10 mg, Oral, Q breakfast, Martensen, Charna Elizabeth III, PA-C, 10 mg at 03/15/19 1032 .  rosuvastatin (CRESTOR) tablet 5 mg, 5 mg, Oral, q1800, Elodia Florence., MD, 5 mg at 03/14/19 1707 .  senna (SENOKOT) tablet 8.6 mg, 1 tablet, Oral, BID, Prudencio Burly III, PA-C, 8.6 mg at 03/14/19 0941 .  sodium phosphate (FLEET) 7-19 GM/118ML enema 1 enema, 1 enema, Rectal, Once PRN, Martensen, Charna Elizabeth III, PA-C  Patients Current Diet:     Diet Order                  Diet Heart Room service appropriate? No; Fluid consistency: Thin  Diet effective now         Diet - low sodium heart healthy               Precautions / Restrictions Precautions Precautions: Posterior Hip Precaution Booklet Issued: Yes (comment) Precaution Comments: pt able to recall 1/3 precautions at end of session  Restrictions Weight Bearing Restrictions: Yes LLE Weight Bearing: Weight bearing as tolerated   Has the patient had 2 or more falls or a fall with injury in the past year? Yes  Prior Activity Level Limited Community (1-2x/wk): Patient short distances with RW with superivison, otherwise w/c  Prior Functional Level Self Care: Did the patient need help bathing, dressing, using the toilet or eating? Needed some help  Indoor Mobility: Did the patient need assistance with walking from room to room (with or without device)? Needed some help  Stairs: Did the patient need assistance with internal or external stairs (with or without device)? Needed some help  Functional Cognition: Did the patient need help planning regular tasks such as shopping or remembering to take medications? Needed some  help  Home Assistive Devices / [place] Devices/Equipment: Cane (specify quad or straight), Eyeglasses, Walker (specify type), Wheelchair(4 wheeled walker, single point cane) Home Equipment: Shower seat, Environmental consultant - 2 wheels, Wheelchair - manual  Prior Device Use: Indicate devices/aids used by the patient prior to current illness, exacerbation or injury? Manual wheelchair and Walker  Current Functional Level Cognition  Overall Cognitive Status: No family/caregiver present to determine baseline cognitive functioning Orientation Level: Oriented to person, Oriented to situation, Oriented to place, Disoriented to time    Extremity Assessment (includes Sensation/Coordination)  Upper Extremity Assessment: Generalized weakness  Lower Extremity Assessment: Defer to PT evaluation, Generalized weakness, LLE deficits/detail LLE Deficits / Details: L LE in knee immobilizer, ankle ROM full  LLE: Unable to fully assess due to pain, Unable to fully assess due to immobilization    ADLs  Overall ADL's : Needs assistance/impaired Eating/Feeding: Set up, Sitting Grooming: Set up Upper Body Bathing: Minimal assistance Lower Body Bathing: Maximal assistance, Total assistance, Sitting/lateral leans, Sit to/from stand Upper Body Dressing : Minimal assistance, Sitting, Standing Lower Body Dressing: Maximal assistance, Total assistance, Sitting/lateral leans, Sit to/from stand, +2 for physical assistance, +2 for safety/equipment, Cueing for safety Toilet Transfer: Maximal assistance, BSC, Ambulation Toileting- Clothing Manipulation and Hygiene: Maximal assistance, Sit to/from stand, Sitting/lateral lean Functional mobility during ADLs: Maximal assistance, Rolling walker, Cueing for safety, Cueing for sequencing General ADL Comments: Pt did well with UB ADL and assisted with LB ADL, but maxA for safety with standing and LB ADL. pt using RW to advance steps to transfer to recliner.     Mobility  Overal bed mobility: Needs Assistance Bed Mobility: Supine to Sit Sidelying to sit: Mod assist General bed mobility comments: cues for technique to push with R LE and assist for L LE and for trunk    Transfers  Overall transfer level: Needs assistance Equipment used: Rolling walker (2 wheeled) Transfer via Lift Equipment: Stedy Transfers: Sit to/from Stand Sit to Stand: Mod assist, +2 physical assistance General transfer comment: lifting assist to stand at stedy, assisted to pivot to chair  on stedy, but maintained standing throughout transfer with assist for balance    Ambulation / Gait / Stairs / Wheelchair Mobility       Posture / Balance Dynamic Sitting Balance Sitting balance - Comments: initially unsupported, but then with LOB posterior turning to look at MD who entered the room Balance Overall balance assessment: Needs assistance Sitting-balance support: Feet supported Sitting balance-Leahy Scale: Poor Sitting balance - Comments: initially unsupported, but then with LOB posterior turning to look at MD who entered the room Standing balance support: Bilateral upper extremity supported Standing balance-Leahy Scale: Poor Standing balance comment: UE support and assist for balance    Special needs/care consideration BiPAP/CPAP  N/a CPM  N/a Continuous Drip IV  N/a Dialysis n/a Life Vest  N/a Oxygen  2 liters [place]; not on home O2 pta Special Bed  N/a Trach Size  N/a Wound Vac n/a Skin surgical incision, r arm abrasion; ecchymosis to bilateral arms and legs Bowel mgmt: LBM 4/12, pt complains of diarrhea since admission Bladder mgmt: external catheter Diabetic mgmt: n/a Behavioral consideration  N/a Chemo/radiation n/a   Previous Home Environment  Living Arrangements: Spouse/significant other  Lives With: Spouse Available Help at Discharge: Available 24 hours/day, Family(can hire assist as needed) Type of Home: House Home Layout: One level Home Access:  Stairs to enter, Electrical engineer of Steps: 1 Bathroom Shower/Tub: Multimedia programmer: Handicapped height Bathroom Accessibility: Yes How Accessible: Accessible via walker Home Care Services: Yes Type of Home Care Services: [place] (if known): [place]  Discharge Living Setting Plans for Discharge Living Setting: Patient's home, Lives with (comment)(spouse) Type of Home at Discharge: House Discharge Home Layout: One level Discharge Home Access: Stairs to enter, Ramped entrance Entrance Stairs-Number of Steps: 1 Discharge Bathroom Shower/Tub: Walk-in shower Discharge Bathroom Toilet: Handicapped height Discharge Bathroom Accessibility: Yes How Accessible: Accessible via walker Does the patient have any problems obtaining your medications?: No  Social/Family/Support Systems Patient Roles: Spouse, Parent(4/30 will have been married for 7o years) Contact Information: son, [name] and spouse, Dr. Bessinger/dentist Anticipated Caregiver: spouse and hires assist as needed Anticipated Ambulance person Information: see above Ability/Limitations of Caregiver: spouse 76 but healthy; [name] is in Games developer Availability: 24/7 Discharge Plan Discussed with Primary Caregiver: Yes Is Caregiver In Agreement with Plan?: Yes Does Caregiver/Family have Issues with Lodging/Transportation while Pt is in Rehab?: No  Goals/Additional Needs Patient/Family Goal for Rehab: supervision PT short distances and then W/c; min assist OT Expected length of stay: [facility] 10 to 14 days; patietn pushing for 1 week Pt/Family Agrees to Admission and willing to participate: Yes Program Orientation Provided & Reviewed with Pt/Caregiver Including Roles  & Responsibilities: Yes  Decrease burden of Care through IP rehab admission: n/a  Possible need for SNF placement upon discharge:  Not anticipated  Patient Condition: I have reviewed medical records from [facility] , spoken with patient, spouse and son. I met with patient at the bedside for inpatient rehabilitation assessment.  Patient will benefit from ongoing PT and OT, can actively participate in 3 hours of therapy a day 5 days of the week, and can make measurable gains during the admission.  Patient will also benefit from the coordinated team approach during an Inpatient Acute Rehabilitation admission.  The patient will receive intensive therapy as well as Rehabilitation physician, nursing, social worker, and care management interventions.  Due to bladder management, bowel management, safety, skin/wound care, disease management, medication administration, pain management and patient  education the patient requires 24 hour a day rehabilitation nursing.  The patient is currently mod assist with mobility and basic ADLs.  Discharge setting and therapy post discharge at home with home health is anticipated.  Patient has agreed to participate in the Acute Inpatient Rehabilitation Program and will admit today.  Preadmission Screen Completed By:  Annamary Rummage MSN 03/15/2019 2:22 PM ______________________________________________________________________   Discussed status with Dr. Naaman Plummer on  03/15/2019 at  1430 and received approval for admission today.  Admission Coordinator:  Cleatrice Burke, RN MSN, time 1430 Date 03/15/2019   Assessment/Plan: Diagnosis: left FNF s/p hip hemi 1. Does the need for close, 24 hr/day Medical supervision in concert with the patient's rehab needs make it unreasonable for this patient to be served in a less intensive setting? Yes 2. Co-Morbidities requiring supervision/potential complications: colon cancer, chronic low back pain, HTN 3. Due to bladder management, bowel management, safety, skin/wound care, disease management, medication administration, pain management and patient education, does the patient require 24 hr/day rehab nursing? Yes 4. Does the  patient require coordinated care of a physician, rehab nurse, PT (1-2 hrs/day, 5 days/week) and OT (1-2 hrs/day, 5 days/week) to address physical and functional deficits in the context of the above medical diagnosis(es)? Yes Addressing deficits in the following areas: balance, endurance, locomotion, strength, transferring, bowel/bladder control, bathing, dressing, feeding, grooming, toileting and psychosocial support 5. Can the patient actively participate in an intensive therapy program of at least 3 hrs of therapy 5 days a week? Yes 6. The potential for patient to make measurable gains while on inpatient rehab is excellent 7. Anticipated functional outcomes upon discharge from inpatients are: supervision PT, min assist OT, n/a SLP 8. Estimated rehab length of stay to reach the above functional goals is: 9-12 days 9. Anticipated D/C setting: Home 10. Anticipated post D/C treatments: [place] therapy 11. Overall Rehab/Functional Prognosis: excellent  MD Signature: [name], MD, [place] [facility] 03/15/2019         Revision History

## 2019-03-15 NOTE — TOC Progression Note (Signed)
Transition of Care [facility]) - Progression Note    Patient Details  Name: Tamara Gregory MRN: [id number] Date of Birth: June 13, 1925  Transition of Care [facility]) CM/SW [place], [place] Phone Number: 03/15/2019, 10:11 AM  Clinical Narrative:    Spoke with pt son [name] on phone, let him know that CIR would evaluate pt appropriateness for inpatient rehab. Pt son agreeable to having SNF offers sent to his email as back up.    Expected Discharge Plan: Skilled Nursing Facility Barriers to Discharge: Continued Medical Work up, Ship broker, Environmental education officer)  Expected Discharge Plan and Services Expected Discharge Plan: [place] In-house Referral: NA Discharge Planning Services: NA Post Acute Care Choice: IP Rehab Living arrangements for the past 2 months: Single Family Home Expected Discharge Date: (unknown)                         Social Determinants of Health (SDOH) Interventions    Readmission Risk Interventions No flowsheet data found.

## 2019-03-15 NOTE — Plan of Care (Signed)

## 2019-03-15 NOTE — Progress Notes (Signed)
Report given to Rehab. Patient to be admitted to 4 MW room 9

## 2019-03-15 NOTE — H&P (Signed)
Physical Medicine and Rehabilitation Admission H&P     Chief complaint:hip pain   HPI: Tamara Gregory is a 85 year old right-handed female with history of  Colon cancer with resection,Chronic back pain followed by Dr. Maryjean Ka with spinal cord stimulator, hypertension, hyperlipidemia,oocular myasthenia gravis. Per chart review patient lives with spouse. Used a wheelchair and walker prior to admission. Husband assists with some basic ADLs. One level home. Presented 03/10/2019 with fall 2 without loss of consciousness. Reports of left hip pain.CT of the hip showed acute subcapital fracture left hip mild impaction and anterior displacement of femoral neck. Cranial CT scan was negative.underwent left hip bipolar hip hemiarthroplasty 03/11/2019 per Dr. Percell Miller. Weightbearing as tolerated with posterior hip precautions. Hospital course pain management.Acute blood loss anemia 8.7. subcutaneous Lovenox for DVT prophylaxis. Escherichia coli UTI completing a course of Keflex. Therapy evaluations completed with recommendations of physical medicine rehabilitation consult. Patient was admitted for a comprehensive rehabilitation program.   Review of Systems  Constitutional: Negative for chills and fever.  HENT: Positive for hearing loss. Negative for tinnitus.   Eyes: Negative for blurred vision and double vision.  Respiratory: Negative for cough and shortness of breath.   Cardiovascular: Positive for leg swelling. Negative for chest pain and palpitations.  Gastrointestinal: Positive for constipation. Negative for heartburn, nausea and vomiting.       GERD  Genitourinary: Negative for dysuria, flank pain and hematuria.  Musculoskeletal: Positive for back pain and falls.  Skin: Negative for rash.  Neurological: Positive for headaches.  Psychiatric/Behavioral: The patient has insomnia.        Anxiety  All other systems reviewed and are negative.   Past Medical History:  Diagnosis Date  . Anemia    .  Anxiety    . Arthritis    . Cancer (HCC)      hx of skin cancer , colon   . Cataract      removed both eyes   . Chronic back pain      okay with sitting, increased back pain with activity   . Depression    . GERD (gastroesophageal reflux disease)    . Glaucoma    . Headache(784.0)      hx of  . History of kidney stones    . History of migraines    . Hyperlipidemia    . Hypertension    . Macular degeneration    . Myasthenia gravis ([place])    . Occasional tremors    . Pneumonia    . Spinal stenosis    . Thoracic aneurysm      4.4cm; see CT done 01/28/12 and 01/22/13 in [facility].   [name] Uses hearing aid    . Vitamin D deficiency           Past Surgical History:  Procedure Laterality Date  . ABDOMINAL HYSTERECTOMY      . CHOLECYSTECTOMY      . COLON SURGERY        Right colectomy dr. gross 03-17-18  . HIP ARTHROPLASTY Left 03/11/2019    Procedure: ARTHROPLASTY BIPOLAR HIP (HEMIARTHROPLASTY);  Surgeon: Renette Butters, MD;  Location: [place] [facility];  Service: Orthopedics;  Laterality: Left;  . LUMBAR LAMINECTOMY/DECOMPRESSION MICRODISCECTOMY N/A 02/04/2013    Procedure: CENTRAL DECOMPRESSION/LUMBAR LAMINECTOMY L3-L4 AND L4-L5 2 LEVELS;  Surgeon: Tobi Bastos, MD;  Location: [facility];  Service: Orthopedics;  Laterality: N/A;  . SPINAL CORD STIMULATOR IMPLANT        in right hip  but not currently using because it did not help  . TONSILLECTOMY      . UPPER GASTROINTESTINAL ENDOSCOPY      . US ECHOCARDIOGRAPHY   05/18/2007    EF 55-60%         Family History  Problem Relation Age of Onset  . Heart attack Father    . Diabetes Father    . Cancer Sister    . Breast cancer Sister    . Cancer Brother    . Lung cancer Brother    . Cancer Sister    . Liver cancer Sister    . Diabetes Sister    . Colon polyps Neg Hx    . Colon cancer Neg Hx    . Esophageal cancer Neg Hx    . Rectal cancer Neg Hx    . Stomach cancer Neg Hx    . Adrenal disorder Neg Hx      Social History:  reports that she  has never smoked. She has never used smokeless tobacco. She reports that she does not drink alcohol or use drugs. Allergies:       Allergies  Allergen Reactions  . Tramadol Other (See Comments)      delirium          Medications Prior to Admission  Medication Sig Dispense Refill  . acetaminophen (TYLENOL) 500 MG tablet Take 2 tablets (1,000 mg total) by mouth 3 (three) times daily. 30 tablet 0  . aspirin 81 MG chewable tablet Chew 0.5 tablets (40.5 mg total) by mouth daily. 30 tablet 0  . brimonidine (ALPHAGAN) 0.2 % ophthalmic solution Place 1 drop into both eyes 3 (three) times daily.       . Calcium Carbonate (CALCIUM 500 PO) Take 500 mg by mouth daily.       . Cholecalciferol (VITAMIN D) 125 MCG (5000 UT) CAPS Take 5,000 Units by mouth daily.       . fluticasone (FLONASE) 50 MCG/ACT nasal spray Place 1 spray into both nostrils at bedtime.      . Glucosamine-Chondroit-Vit C-Mn (GLUCOSAMINE 1500 COMPLEX PO) Take 1 tablet by mouth daily.       [name] latanoprost (XALATAN) 0.005 % ophthalmic solution Place 1 drop into the left eye at bedtime.   1  . losartan-hydrochlorothiazide (HYZAAR) 50-12.5 MG tablet Take 1 tablet by mouth daily. 90 tablet 3  . Multiple Vitamins-Minerals (PRESERVISION AREDS) CAPS Take 1 capsule by mouth 2 (two) times daily.       . Omega-3 Fatty Acids (FISH OIL) 435 MG CAPS Take 435 mg by mouth daily.      . predniSONE (DELTASONE) 10 MG tablet Take 2 tablets (20 mg total) by mouth daily with breakfast. (Patient taking differently: Take 10 mg by mouth daily with breakfast. ) 180 tablet 3  . rosuvastatin (CRESTOR) 5 MG tablet TAKE 1 TABLET EVERY DAY IN THE EVENING 90 tablet 1  . sertraline (ZOLOFT) 100 MG tablet Take 1.5 tablets (150 mg total) by mouth at bedtime. (Patient taking differently: Take 200 mg by mouth at bedtime. ) 135 tablet 3      Drug Regimen Review Drug regimen was reviewed and remains appropriate with no significant issues identified   Home: Home Living  Family/patient expects to be discharged to:: Private residence Living Arrangements: Spouse/significant other Available Help at Discharge: Family, Available 24 hours/day(spouse is 20) Type of Home: House Home Access: Stairs to enter, Ramped entrance Technical brewer of Steps: 1 Home Layout: One level Bathroom  Shower/Tub: Multimedia programmer: Handicapped height Bathroom Accessibility: Yes Home Equipment: Civil engineer, contracting, Environmental consultant - 2 wheels, Wheelchair - manual   Functional History: Prior Function Level of Independence: Needs assistance Gait / Transfers Assistance Needed: use wheelchair or walker for <40 feet ADL's / Homemaking Assistance Needed: husband bathes and dresses her and does iADLS pt reports due to excessive sweat   Functional Status:  Mobility: Bed Mobility Overal bed mobility: Needs Assistance Bed Mobility: Sidelying to Sit Sidelying to sit: Mod assist General bed mobility comments: advancing LLE with assist Transfers Overall transfer level: Needs assistance Equipment used: Rolling walker (2 wheeled) Transfers: Sit to/from Stand Sit to Stand: Max assist, From elevated surface General transfer comment: Mod to maxA for sit to stand and able to advance weight on for ambulation to recliner.   ADL: ADL Overall ADL's : Needs assistance/impaired Eating/Feeding: Set up, Sitting Grooming: Set up Upper Body Bathing: Minimal assistance Lower Body Bathing: Maximal assistance, Total assistance, Sitting/lateral leans, Sit to/from stand Upper Body Dressing : Minimal assistance, Sitting, Standing Lower Body Dressing: Maximal assistance, Total assistance, Sitting/lateral leans, Sit to/from stand, +2 for physical assistance, +2 for safety/equipment, Cueing for safety Toilet Transfer: Maximal assistance, BSC, Ambulation Toileting- Clothing Manipulation and Hygiene: Maximal assistance, Sit to/from stand, Sitting/lateral lean Functional mobility during ADLs: Maximal  assistance, Rolling walker, Cueing for safety, Cueing for sequencing General ADL Comments: Pt did well with UB ADL and assisted with LB ADL, but maxA for safety with standing and LB ADL. pt using RW to advance steps to transfer to recliner.   Cognition: Cognition Overall Cognitive Status: Within Functional Limits for tasks assessed Orientation Level: Oriented to person, Oriented to situation, Oriented to place, Disoriented to time Cognition Arousal/Alertness: Awake/alert Behavior During Therapy: WFL for tasks assessed/performed Overall Cognitive Status: Within Functional Limits for tasks assessed   Physical Exam: Blood pressure (!) 154/83, pulse 68, temperature 99 F (37.2 C), temperature source Oral, resp. rate 14, height 5\' 6"  (1.676 m), weight 66.9 kg, SpO2 95 %. Physical Exam  Constitutional: She is oriented to person, place, and time. No distress.  HENT:  Head: Normocephalic and atraumatic.  Eyes: Pupils are equal, round, and reactive to light.  Neck: Normal range of motion. Neck supple.  Cardiovascular: Normal rate and regular rhythm.  Respiratory: No respiratory distress. She has no wheezes.  GI: Soft. She exhibits no distension.  Musculoskeletal: Normal range of motion.  Neurological: She is alert and oriented to person, place, and time. She has normal reflexes.  Patient is alert sitting up in chair reading the newspaper. Makes good eye contact with examiner. Provides her name and age. Limited but fair medical historian. UE 5/5. RLE 4/5 prox to distal. LLE 2+ HF, KE and 4/5 ADF/PF. No sensory findings.   Skin: Skin is warm. She is not diaphoretic.  Left hip incision CDI. Multiple lacs/bruises LLE>RLE  Psychiatric: She has a normal mood and affect. Her behavior is normal. Thought content normal.      Lab Results Last 48 Hours  Results for orders placed or performed during the hospital encounter of 03/10/19 (from the past 48 hour(s))  CBC     Status: Abnormal    Collection  Time: 03/14/19  3:31 AM  Result Value Ref Range    WBC 9.6 4.0 - 10.5 K/uL    RBC 2.85 (L) 3.87 - 5.11 MIL/uL    Hemoglobin 8.3 (L) 12.0 - 15.0 g/dL    HCT 27.5 (L) 36.0 - 46.0 %  MCV 96.5 80.0 - 100.0 fL    MCH 29.1 26.0 - 34.0 pg    MCHC 30.2 30.0 - 36.0 g/dL    RDW 16.8 (H) 11.5 - 15.5 %    Platelets 167 150 - 400 K/uL    nRBC 0.0 0.0 - 0.2 %      Comment: Performed at [facility] [address], [place], [place] [address]  Basic metabolic panel     Status: Abnormal    Collection Time: 03/14/19  3:31 AM  Result Value Ref Range    Sodium 140 135 - 145 mmol/L    Potassium 3.1 (L) 3.5 - 5.1 mmol/L    Chloride 103 98 - 111 mmol/L    CO2 24 22 - 32 mmol/L    Glucose, Bld 111 (H) 70 - 99 mg/dL    BUN 12 8 - 23 mg/dL    Creatinine, Ser 0.46 0.44 - 1.00 mg/dL    Calcium 8.7 (L) 8.9 - 10.3 mg/dL    GFR calc non Af Amer >60 >60 mL/min    GFR calc Af Amer >60 >60 mL/min    Anion gap 13 5 - 15      Comment: Performed at [facility], [place] [address] [place], [place] [address]  Magnesium     Status: None    Collection Time: 03/14/19  3:31 AM  Result Value Ref Range    Magnesium 1.8 1.7 - 2.4 mg/dL      Comment: Performed at [facility] [address] [place], [place] [address]  CBC     Status: Abnormal    Collection Time: 03/15/19  4:31 AM  Result Value Ref Range    WBC 9.6 4.0 - 10.5 K/uL    RBC 2.97 (L) 3.87 - 5.11 MIL/uL    Hemoglobin 8.7 (L) 12.0 - 15.0 g/dL    HCT 28.4 (L) 36.0 - 46.0 %    MCV 95.6 80.0 - 100.0 fL    MCH 29.3 26.0 - 34.0 pg    MCHC 30.6 30.0 - 36.0 g/dL    RDW 16.5 (H) 11.5 - 15.5 %    Platelets 192 150 - 400 K/uL    nRBC 0.2 0.0 - 0.2 %      Comment: Performed at [facility], [place] [address], [place], [place] [address]  Comprehensive metabolic panel     Status: Abnormal    Collection Time: 03/15/19  4:31 AM  Result Value Ref Range    Sodium 142 135 - 145 mmol/L    Potassium 4.1 3.5 - 5.1 mmol/L    Chloride 105 98 - 111  mmol/L    CO2 26 22 - 32 mmol/L    Glucose, Bld 102 (H) 70 - 99 mg/dL    BUN 12 8 - 23 mg/dL    Creatinine, Ser 0.48 0.44 - 1.00 mg/dL    Calcium 8.9 8.9 - 10.3 mg/dL    Total Protein 5.4 (L) 6.5 - 8.1 g/dL    Albumin 2.7 (L) 3.5 - 5.0 g/dL    AST 22 15 - 41 U/L    ALT 20 0 - 44 U/L    Alkaline Phosphatase 58 38 - 126 U/L    Total Bilirubin 0.6 0.3 - 1.2 mg/dL    GFR calc non Af Amer >60 >60 mL/min    GFR calc Af Amer >60 >60 mL/min    Anion gap 11 5 - 15  Comment: Performed at [facility], [place] [address] [place], [place] [address]  Magnesium     Status: None    Collection Time: 03/15/19  4:31 AM  Result Value Ref Range    Magnesium 1.9 1.7 - 2.4 mg/dL      Comment: Performed at [facility] [address] [place], [place] [address]      Imaging Results (Last 48 hours)  No results found.           Medical Problem List and Plan: 1.  Decreased functional mobility secondary to left displaced femoral neck fracture .Status post bipolar hip hemiarthroplasty.              -admit to inpatient rehab             -Posterior hip precautions weightbearing as tolerated 2.  Antithrombotics: -DVT/anticoagulation:  Subcutaneous Lovenox. Check vascular study             -antiplatelet therapy: N/A 3. Pain Management/chronic back pain: Hydrocodone as needed 4. Mood: provide emotional support             -antipsychotic agents: N/A 5. Neuropsych: This patient is capable of making decisions on her own behalf. 6. Skin/Wound Care:  Routine skin checks,              -local wound care to hip incision numerous abrasions/lacs in LE's 7. Fluids/Electrolytes/Nutrition:  Routine in and out's with follow-up chemistries 8. Acute blood loss anemia. Follow-up CBC 9. History of ocular myasthenia gravis. Continue chronic prednisone 10. Hypertension. HCTZ 12.5 mg daily 11. Escherichia coli UTI. Complete course of Keflex 12. Hyperlipidemia. Crestor 13. Constipation. Laxative assistance      Post Admission Physician Evaluation: 1. Functional deficits secondary  to left FNF s/p hip hemiarthroplasty. 2. Patient is admitted to receive collaborative, interdisciplinary care between the physiatrist, rehab nursing staff, and therapy team. 3. Patient's level of medical complexity and substantial therapy needs in context of that medical necessity cannot be provided at a lesser intensity of care such as a SNF. 4. Patient has experienced substantial functional loss from his/her baseline which was documented above under the "Functional History" and "Functional Status" headings.  Judging by the patient's diagnosis, physical exam, and functional history, the patient has potential for functional progress which will result in measurable gains while on inpatient rehab.  These gains will be of substantial and practical use upon discharge  in facilitating mobility and self-care at the household level. 5. Physiatrist will provide 24 hour management of medical needs as well as oversight of the therapy plan/treatment and provide guidance as appropriate regarding the interaction of the two. 6. The Preadmission Screening has been reviewed and patient status is unchanged unless otherwise stated above. 7. 24 hour rehab nursing will assist with bladder management, bowel management, safety, skin/wound care, disease management, medication administration, pain management and patient education  and help integrate therapy concepts, techniques,education, etc. 8. PT will assess and treat for/with: Lower extremity strength, range of motion, stamina, balance, functional mobility, safety, adaptive techniques and equipment, pain control, ortho precautions.   Goals are: supervision. 9. OT will assess and treat for/with: ADL's, functional mobility, safety, upper extremity strength, adaptive techniques and equipment, pain mgt, community reentry.   Goals are: supervision to min assist. Therapy may proceed with showering this  patient. 10. SLP will assess and treat for/with: n/a.  Goals are: n/a. 11. Case Management and Social Worker will assess and treat for psychological issues and discharge  planning. 12. Team conference will be held weekly to assess progress toward goals and to determine barriers to discharge. 13. Patient will receive at least 3 hours of therapy per day at least 5 days per week. 14. ELOS: 9-12 days       15. Prognosis:  excellent   [name], MD, [facility] 03/15/2019     Lavon Paganini [place], PA-C 03/15/2019

## 2019-03-15 NOTE — PMR Pre-admission (Signed)
PMR Admission Coordinator Pre-Admission Assessment  Patient: Tamara Gregory is an 85 y.o., female MRN: [id number] DOB: 1925-07-30 Height: '5\' 6"'  (167.6 cm) Weight: 66.9 kg  Insurance Information HMO: yes    PPO:      PCP:      IPA:      80/20:      OTHER: medicare advantage plan PRIMARY: [place]      Policy#: [id number]      Subscriber: pt CM Name: Margaretha Sheffield      Phone#: [phone] ext [phone]     Fax#: [phone] Pre-Cert#: [id number] for 7 days with f/u Sharee Pimple      Employer: retired Benefits:  Phone #: [phone]     Name: 4/13 Eff. Date: 12/02/2018     Deduct: none      Out of Pocket Max: $3600      Life Max: none CIR: $295 co pay per day days 1 until 5      SNF: no co pay days 1 until 20; $160 co pay per day days 21 until 43; no co pay per day days 44 until 100 Outpatient: $30 co pay per visit     Co-Pay: visits per medical neccesity Home Health: 100%      Co-Pay: visits per medical neccesity DME: 80%     Co-Pay: 20% Providers: in network  SECONDARY: none        Medicaid Application Date:       Case Manager:  Disability Application Date:       Case Worker:   The "Data Collection Information Summary" for patients in Inpatient Rehabilitation Facilities with attached "Privacy Act [place] Records" was provided and verbally reviewed with: Family  Emergency Contact Information Contact Information    Name Relation Home Work [facility] [address] Raechel [place] [phone]  [phone]   Gerlene, [place]   [phone]      Current Medical History  Patient Admitting Diagnosis: left femoral neck fracture  History of Present Illness: Tamara Gregory is a 85 year old right-handed female with history of  Colon cancer with resection,Chronic back pain followed by Dr. Maryjean Ka with spinal cord stimulator, hypertension, hyperlipidemia,ocular myasthenia gravis. Presented 03/10/2019 with fall 2 without loss of consciousness. Reports of left hip pain.CT of the hip  showed acute subcapital fracture left hip mild impaction and anterior displacement of femoral neck. Cranial CT scan was negative.underwent left hip bipolar hip hemiarthroplasty 03/11/2019 per Dr. Percell Miller. Weightbearing as tolerated with posterior hip precautions. Hospital course pain management.Acute blood loss anemia 8.7. subcutaneous Lovenox for DVT prophylaxis. Escherichia coli UTI completing a course of Keflex.   Patient's medical record from [facility] has been reviewed by the rehabilitation admission coordinator and physician.  Past Medical History  Past Medical History:  Diagnosis Date  . Anemia   . Anxiety   . Arthritis   . Cancer (HCC)    hx of skin cancer , colon   . Cataract    removed both eyes   . Chronic back pain    okay with sitting, increased back pain with activity   . Depression   . GERD (gastroesophageal reflux disease)   . Glaucoma   . Headache(784.0)    hx of  . History of kidney stones   . History of migraines   . Hyperlipidemia   . Hypertension   . Macular degeneration   . Myasthenia gravis ([place])   . Occasional tremors   . Pneumonia   . Spinal  stenosis   . Thoracic aneurysm    4.4cm; see CT done 01/28/12 and 01/22/13 in [facility].   [name] Uses hearing aid   . Vitamin D deficiency     Family History   family history includes Breast cancer in her sister; Cancer in her brother, sister, and sister; Diabetes in her father and sister; Heart attack in her father; Liver cancer in her sister; Lung cancer in her brother.  Prior Rehab/Hospitalizations Has the patient had prior rehab or hospitalizations prior to admission? Yes  Has the patient had major surgery during 100 days prior to admission? Yes   Current Medications  Current Facility-Administered Medications:  .  acetaminophen (TYLENOL) tablet 650 mg, 650 mg, Oral, Q6H, Martensen, Charna Elizabeth III, PA-C, 650 mg at 03/15/19 1206 .  bisacodyl (DULCOLAX) suppository 10 mg, 10 mg, Rectal, Daily PRN,  Martensen, Charna Elizabeth III, PA-C .  brimonidine (ALPHAGAN) 0.2 % ophthalmic solution 1 drop, 1 drop, Both Eyes, TID, Martensen, Charna Elizabeth III, PA-C, 1 drop at 03/15/19 1033 .  cephALEXin (KEFLEX) capsule 500 mg, 500 mg, Oral, BID, Elodia Florence., MD, 500 mg at 03/15/19 1033 .  enoxaparin (LOVENOX) injection 40 mg, 40 mg, Subcutaneous, Q24H, Martensen, Charna Elizabeth III, PA-C, 40 mg at 03/14/19 1706 .  fluticasone (FLONASE) 50 MCG/ACT nasal spray 1 spray, 1 spray, Each Nare, QHS, Martensen, Charna Elizabeth III, PA-C, 1 spray at 03/14/19 2105 .  hydrochlorothiazide (MICROZIDE) capsule 12.5 mg, 12.5 mg, Oral, Daily, Elodia Florence., MD, 12.5 mg at 03/15/19 1033 .  HYDROcodone-acetaminophen (NORCO/VICODIN) 5-325 MG per tablet 1 tablet, 1 tablet, Oral, Q6H PRN, Prudencio Burly III, PA-C, 1 tablet at 03/15/19 1033 .  HYDROmorphone (DILAUDID) injection 0.25-0.5 mg, 0.25-0.5 mg, Intravenous, Q4H PRN, Martensen, Charna Elizabeth III, PA-C .  latanoprost (XALATAN) 0.005 % ophthalmic solution 1 drop, 1 drop, Left Eye, QHS, Martensen, Charna Elizabeth III, PA-C, 1 drop at 03/14/19 2105 .  loperamide (IMODIUM) capsule 2 mg, 2 mg, Oral, PRN, Elodia Florence., MD, 2 mg at 03/15/19 1042 .  metoCLOPramide (REGLAN) tablet 5-10 mg, 5-10 mg, Oral, Q8H PRN **OR** metoCLOPramide (REGLAN) injection 5-10 mg, 5-10 mg, Intravenous, Q8H PRN, Martensen, Charna Elizabeth III, PA-C .  ondansetron (ZOFRAN) tablet 4 mg, 4 mg, Oral, Q6H PRN, 4 mg at 03/15/19 1042 **OR** ondansetron (ZOFRAN) injection 4 mg, 4 mg, Intravenous, Q6H PRN, Martensen, Charna Elizabeth III, PA-C .  polyethylene glycol (MIRALAX / GLYCOLAX) packet 17 g, 17 g, Oral, BID, Elodia Florence., MD, 17 g at 03/14/19 0941 .  predniSONE (DELTASONE) tablet 10 mg, 10 mg, Oral, Q breakfast, Martensen, Charna Elizabeth III, PA-C, 10 mg at 03/15/19 1032 .  rosuvastatin (CRESTOR) tablet 5 mg, 5 mg, Oral, q1800, Elodia Florence., MD, 5 mg at 03/14/19  1707 .  senna (SENOKOT) tablet 8.6 mg, 1 tablet, Oral, BID, Prudencio Burly III, PA-C, 8.6 mg at 03/14/19 0941 .  sodium phosphate (FLEET) 7-19 GM/118ML enema 1 enema, 1 enema, Rectal, Once PRN, Martensen, Charna Elizabeth III, PA-C  Patients Current Diet:  Diet Order            Diet Heart Room service appropriate? No; Fluid consistency: Thin  Diet effective now        Diet - low sodium heart healthy              Precautions / Restrictions Precautions Precautions: Posterior Hip Precaution Booklet Issued: Yes (comment) Precaution Comments: pt able to recall 1/3 precautions at  end of session  Restrictions Weight Bearing Restrictions: Yes LLE Weight Bearing: Weight bearing as tolerated   Has the patient had 2 or more falls or a fall with injury in the past year? Yes  Prior Activity Level Limited Community (1-2x/wk): Patient short distances with RW with superivison, otherwise w/c  Prior Functional Level Self Care: Did the patient need help bathing, dressing, using the toilet or eating? Needed some help  Indoor Mobility: Did the patient need assistance with walking from room to room (with or without device)? Needed some help  Stairs: Did the patient need assistance with internal or external stairs (with or without device)? Needed some help  Functional Cognition: Did the patient need help planning regular tasks such as shopping or remembering to take medications? Needed some help  Home Assistive Devices / [place] Devices/Equipment: Cane (specify quad or straight), Eyeglasses, Walker (specify type), Wheelchair(4 wheeled walker, single point cane) Home Equipment: Shower seat, Environmental consultant - 2 wheels, Wheelchair - manual  Prior Device Use: Indicate devices/aids used by the patient prior to current illness, exacerbation or injury? Manual wheelchair and Walker  Current Functional Level Cognition  Overall Cognitive Status: No family/caregiver present to determine  baseline cognitive functioning Orientation Level: Oriented to person, Oriented to situation, Oriented to place, Disoriented to time    Extremity Assessment (includes Sensation/Coordination)  Upper Extremity Assessment: Generalized weakness  Lower Extremity Assessment: Defer to PT evaluation, Generalized weakness, LLE deficits/detail LLE Deficits / Details: L LE in knee immobilizer, ankle ROM full  LLE: Unable to fully assess due to pain, Unable to fully assess due to immobilization    ADLs  Overall ADL's : Needs assistance/impaired Eating/Feeding: Set up, Sitting Grooming: Set up Upper Body Bathing: Minimal assistance Lower Body Bathing: Maximal assistance, Total assistance, Sitting/lateral leans, Sit to/from stand Upper Body Dressing : Minimal assistance, Sitting, Standing Lower Body Dressing: Maximal assistance, Total assistance, Sitting/lateral leans, Sit to/from stand, +2 for physical assistance, +2 for safety/equipment, Cueing for safety Toilet Transfer: Maximal assistance, BSC, Ambulation Toileting- Clothing Manipulation and Hygiene: Maximal assistance, Sit to/from stand, Sitting/lateral lean Functional mobility during ADLs: Maximal assistance, Rolling walker, Cueing for safety, Cueing for sequencing General ADL Comments: Pt did well with UB ADL and assisted with LB ADL, but maxA for safety with standing and LB ADL. pt using RW to advance steps to transfer to recliner.    Mobility  Overal bed mobility: Needs Assistance Bed Mobility: Supine to Sit Sidelying to sit: Mod assist General bed mobility comments: cues for technique to push with R LE and assist for L LE and for trunk    Transfers  Overall transfer level: Needs assistance Equipment used: Rolling walker (2 wheeled) Transfer via Lift Equipment: Stedy Transfers: Sit to/from Stand Sit to Stand: Mod assist, +2 physical assistance General transfer comment: lifting assist to stand at stedy, assisted to pivot to chair on  stedy, but maintained standing throughout transfer with assist for balance    Ambulation / Gait / Stairs / Wheelchair Mobility       Posture / Balance Dynamic Sitting Balance Sitting balance - Comments: initially unsupported, but then with LOB posterior turning to look at MD who entered the room Balance Overall balance assessment: Needs assistance Sitting-balance support: Feet supported Sitting balance-Leahy Scale: Poor Sitting balance - Comments: initially unsupported, but then with LOB posterior turning to look at MD who entered the room Standing balance support: Bilateral upper extremity supported Standing balance-Leahy Scale: Poor Standing balance comment: UE support and assist for  balance    Special needs/care consideration BiPAP/CPAP  N/a CPM  N/a Continuous Drip IV  N/a Dialysis n/a Life Vest  N/a Oxygen  2 liters [place]; not on home O2 pta Special Bed  N/a Trach Size  N/a Wound Vac n/a Skin surgical incision, r arm abrasion; ecchymosis to bilateral arms and legs Bowel mgmt: LBM 4/12, pt complains of diarrhea since admission Bladder mgmt: external catheter Diabetic mgmt: n/a Behavioral consideration  N/a Chemo/radiation n/a   Previous Home Environment  Living Arrangements: Spouse/significant other  Lives With: Spouse Available Help at Discharge: Available 24 hours/day, Family(can hire assist as needed) Type of Home: House Home Layout: One level Home Access: Stairs to enter, Electrical engineer of Steps: 1 Bathroom Shower/Tub: Multimedia programmer: Handicapped height Bathroom Accessibility: Yes How Accessible: Accessible via walker [place]: Yes Type of [place]: [place] (if known): [place]  Discharge Living Setting Plans for Discharge Living Setting: Patient's home, Lives with (comment)(spouse) Type of Home at Discharge: House Discharge Home Layout: One level Discharge Home Access: Stairs to  enter, Ramped entrance Entrance Stairs-Number of Steps: 1 Discharge Bathroom Shower/Tub: Walk-in shower Discharge Bathroom Toilet: Handicapped height Discharge Bathroom Accessibility: Yes How Accessible: Accessible via walker Does the patient have any problems obtaining your medications?: No  Social/Family/Support Systems Patient Roles: Spouse, Parent(4/30 will have been married for 7o years) Contact Information: son, [name] and spouse, Dr. Salway/dentist Anticipated Caregiver: spouse and hires assist as needed Anticipated Caregiver's Contact Information: see above Ability/Limitations of Caregiver: spouse 84 but healthy; [name] is in Games developer Availability: 24/7 Discharge Plan Discussed with Primary Caregiver: Yes Is Caregiver In Agreement with Plan?: Yes Does Caregiver/Family have Issues with Lodging/Transportation while Pt is in Rehab?: No  Goals/Additional Needs Patient/Family Goal for Rehab: supervision PT short distances and then W/c; min assist OT Expected length of stay: ELOS 10 to 14 days; patietn pushing for 1 week Pt/Family Agrees to Admission and willing to participate: Yes Program Orientation Provided & Reviewed with Pt/Caregiver Including Roles  & Responsibilities: Yes  Decrease burden of Care through IP rehab admission: n/a  Possible need for SNF placement upon discharge:  Not anticipated  Patient Condition: I have reviewed medical records from [facility] , spoken with patient, spouse and son. I met with patient at the bedside for inpatient rehabilitation assessment.  Patient will benefit from ongoing PT and OT, can actively participate in 3 hours of therapy a day 5 days of the week, and can make measurable gains during the admission.  Patient will also benefit from the coordinated team approach during an Inpatient Acute Rehabilitation admission.  The patient will receive intensive therapy as well as Rehabilitation physician, nursing, social worker, and care  management interventions.  Due to bladder management, bowel management, safety, skin/wound care, disease management, medication administration, pain management and patient education the patient requires 24 hour a day rehabilitation nursing.  The patient is currently mod assist with mobility and basic ADLs.  Discharge setting and therapy post discharge at home with home health is anticipated.  Patient has agreed to participate in the Acute Inpatient Rehabilitation Program and will admit today.  Preadmission Screen Completed By:  Annamary Rummage MSN 03/15/2019 2:22 PM ______________________________________________________________________   Discussed status with Dr. Naaman Plummer on  03/15/2019 at  1430 and received approval for admission today.  Admission Coordinator:  Cleatrice Burke, RN MSN, time 1430 Date 03/15/2019   Assessment/Plan: Diagnosis: left FNF s/p hip hemi  1. Does the need for close, 24 hr/day Medical supervision in concert with the patient's rehab needs make it unreasonable for this patient to be served in a less intensive setting? Yes 2. Co-Morbidities requiring supervision/potential complications: colon cancer, chronic low back pain, HTN 3. Due to bladder management, bowel management, safety, skin/wound care, disease management, medication administration, pain management and patient education, does the patient require 24 hr/day rehab nursing? Yes 4. Does the patient require coordinated care of a physician, rehab nurse, PT (1-2 hrs/day, 5 days/week) and OT (1-2 hrs/day, 5 days/week) to address physical and functional deficits in the context of the above medical diagnosis(es)? Yes Addressing deficits in the following areas: balance, endurance, locomotion, strength, transferring, bowel/bladder control, bathing, dressing, feeding, grooming, toileting and psychosocial support 5. Can the patient actively participate in an intensive therapy program of at least 3 hrs of therapy 5 days  a week? Yes 6. The potential for patient to make measurable gains while on inpatient rehab is excellent 7. Anticipated functional outcomes upon discharge from inpatients are: supervision PT, min assist OT, n/a SLP 8. Estimated rehab length of stay to reach the above functional goals is: 9-12 days 9. Anticipated D/C setting: Home 10. Anticipated post D/C treatments: [place] therapy 11. Overall Rehab/Functional Prognosis: excellent  MD Signature: [name], MD, [facility] 03/15/2019

## 2019-03-15 NOTE — Discharge Summary (Signed)
Physician Discharge Summary  Tamara Gregory EXB:[id number] DOB: 04/18/1925 DOA: 03/10/2019  PCP: Binnie Rail, MD  Admit date: 03/10/2019 Discharge date: 03/15/2019  Time spent: 40 minutes  Recommendations for Outpatient Follow-up:  1. Follow outpatient CBC/CMP 2. Follow up with orthopedics outpatient    Discharge Diagnoses:  Principal Problem:   Left displaced femoral neck fracture [facility]) Active Problems:   Hip fracture [facility])   Preoperative cardiovascular examination   Discharge Condition: stable  Diet recommendation: heart healthy  Filed Weights   03/10/19 1113 03/10/19 1816 03/11/19 1041  Weight: 68 kg 66.9 kg 66.9 kg    History of present illness:  Per HPI Tamara B Holmesis Tamara Gregory 85 y.o.femalewith medical history significant ofcolon cancer s/p resection, chronic back pain s/p lumbar laminectomy and decompression microdisectomy ,iron deficiency anemia, h/o of ocular myasthenia gravis, GERD, headaches, hypertension, hyperlipidemia, macular degeneration, memory deficits, thoracic aneurysm, was brought in for persistent left hip pain and unable to bear weight on the left side. She reports pain on the left hip. She couldn't remember how she fell and I couldn't get detailed history. On talking to her husband, he reports patient fell Sunday night and Monday night and was unable to walk. There was no fever or chills.  ED Course:on arrival to ED, she was afebrile, slightly tachycardic, hypertensive ,. Lab work revealed hypokalemia, mild leukocytosis of 12.6, hemoglobin of 10.6,. UA shows positive nitrite, many bacteria with moderate leukocytes. CT of the left hip showsAcute subcapital fracture of the left hip demonstrates mild impaction and anterior displacement of femoral neck.  Pt denies any dysuria, . Orthopedics consulted and recommended to transfer the patient to [facility] for hip repair.   She was referred to medical service for admission.  She was admitted for Tamara Gregory left hip fracture.   She's now s/p L hemiarthroplasty.  Post op course was c/b delirium, which has improved.  Plan for d/c today to CIR.   See below for additional details.  Hospital Course:  Left Subcapital Fracture of the Hip:  2/2 mechanical fall in the bathroom  Appreciate orthopedics assistance, now s/p hemiarthroplasty on 4/9  Cardiology was c/s for clearance by admitting provider - appreciate assistance, per cards pt at increased risk, but not prohibitive (watch large fluid volumes and BP perioperatively) Lovenox post op for DVT ppx PT/OT WBAT per ortho Plan for CIR today  Delirium: Improved today.  Continue delirium precautions.  O2 Requirement: mild, suspect this is not true O2 requirement.  Weaned at bedside to RA with sats 93%.  Continue to follow closely.  Hypertension: Resume home BP meds  Hyperlipidemia: Crestor  Hypokalemia:  improved  H/o ocular myasthenia gravis:  Pt is on chronic steroids, continue the same.   H/o colon cancer s/p surgical resection:   UTI: E. Coli sensitive to ancef, narrowed to keflex  Anemia of chronic disease:  Slightly downtrending post op, today relatively stable ~8  Prolonged QT; Replace K and Magnesium . Keep K>4 and magnesium >2.  Repeat EKG in AM (improved to ~470's)  Hypokalemia: replace, follow   Procedures:  4/9 hemiarthroplasty  Consultations:  orthopedics  Discharge Exam: Vitals:   03/15/19 0536 03/15/19 1409  BP: (!) 154/83 (!) 132/91  Pulse: 68 88  Resp: 14 18  Temp: 99 F (37.2 C) 98.4 F (36.9 C)  SpO2: 95% 93%   Feeling well Ready for d/c to rehab.  General: No acute distress. Cardiovascular: Heart sounds show Tamara Gregory regular rate, and rhythm. No gallops or rubs. No murmurs.  No JVD. Lungs: Clear to auscultation bilaterally Abdomen: Soft, nontender, nondistended Neurological: Alert and oriented 3. Moves all extremities 4. Cranial nerves II through XII grossly intact. Skin: Warm and dry. No rashes or  lesions. Extremities: LLE dressing intact Psychiatric: Mood and affect are normal. Insight and judgment are appropriate.  Discharge Instructions   Discharge Instructions    Call MD for:  difficulty breathing, headache or visual disturbances   Complete by:  As directed    Call MD for:  extreme fatigue   Complete by:  As directed    Call MD for:  hives   Complete by:  As directed    Call MD for:  persistant dizziness or light-headedness   Complete by:  As directed    Call MD for:  persistant nausea and vomiting   Complete by:  As directed    Call MD for:  redness, tenderness, or signs of infection (pain, swelling, redness, odor or green/yellow discharge around incision site)   Complete by:  As directed    Call MD for:  severe uncontrolled pain   Complete by:  As directed    Call MD for:  temperature >100.4   Complete by:  As directed    Diet - low sodium heart healthy   Complete by:  As directed    Discharge instructions   Complete by:  As directed    You were seen for Tamara Gregory left femur fracture.  This was surgically repaired by orthopedics.  You're on lovenox for DVT prophylaxis (blood clot prevention) after surgery.  You're completing Tamara Gregory course of keflex for Tamara Gregory urinary tract infection.  Please follow up with orthopedics as planned.  Please follow up with your PCP.  Return for new, recurrent, or worsening symptoms.  Please ask your PCP to request records from this hospitalization so they know what was done and what the next steps will be.   Increase activity slowly   Complete by:  As directed      Allergies as of 03/15/2019      Reactions   Tramadol Other (See Comments)   delirium      Medication List    TAKE these medications   acetaminophen 500 MG tablet Commonly known as:  TYLENOL Take 2 tablets (1,000 mg total) by mouth 3 (three) times daily.   aspirin 81 MG chewable tablet Chew 0.5 tablets (40.5 mg total) by mouth daily.   brimonidine 0.2 % ophthalmic  solution Commonly known as:  ALPHAGAN Place 1 drop into both eyes 3 (three) times daily.   CALCIUM 500 PO Take 500 mg by mouth daily.   cephALEXin 500 MG capsule Commonly known as:  KEFLEX Take 1 capsule (500 mg total) by mouth 2 (two) times daily for 2 days.   enoxaparin 40 MG/0.4ML injection Commonly known as:  LOVENOX Inject 0.4 mLs (40 mg total) into the skin daily for 30 doses. For 30 days post op for DVT prophylaxis   Fish Oil 435 MG Caps Take 435 mg by mouth daily.   Flonase 50 MCG/ACT nasal spray Generic drug:  fluticasone Place 1 spray into both nostrils at bedtime.   GLUCOSAMINE 1500 COMPLEX PO Take 1 tablet by mouth daily.   HYDROcodone-acetaminophen 5-325 MG tablet Commonly known as:  Norco Take 1 tablet by mouth every 8 (eight) hours as needed for severe pain (Use tylenol for mild or moderate pain.).   latanoprost 0.005 % ophthalmic solution Commonly known as:  XALATAN Place 1 drop into the left eye  at bedtime.   losartan-hydrochlorothiazide 50-12.5 MG tablet Commonly known as:  Hyzaar Take 1 tablet by mouth daily.   predniSONE 10 MG tablet Commonly known as:  DELTASONE Take 1 tablet (10 mg total) by mouth daily with breakfast for 30 days.   PreserVision AREDS Caps Take 1 capsule by mouth 2 (two) times daily.   rosuvastatin 5 MG tablet Commonly known as:  CRESTOR TAKE 1 TABLET EVERY DAY IN THE EVENING   sertraline 100 MG tablet Commonly known as:  ZOLOFT Take 1.5 tablets (150 mg total) by mouth at bedtime. What changed:  how much to take   Vitamin D 125 MCG (5000 UT) Caps Take 5,000 Units by mouth daily.      Allergies  Allergen Reactions  . Tramadol Other (See Comments)    delirium   Follow-up Information    Renette Butters, MD In 2 weeks.   Specialty:  Orthopedic Surgery Contact information: [address] [place] [address] [phone]        Binnie Rail, MD Follow up.   Specialty:  Internal  Medicine Contact information: [place] [address] [phone]        Nahser, Wonda Cheng, MD .   Specialty:  Cardiology Contact information: [place]  [address] [phone]            The results of significant diagnostics from this hospitalization (including imaging, microbiology, ancillary and laboratory) are listed below for reference.    Significant Diagnostic Studies: Ct Head Wo Contrast  Result Date: 03/10/2019 CLINICAL DATA:  Fall 3 days prior.  Headache. EXAM: CT HEAD WITHOUT CONTRAST TECHNIQUE: Contiguous axial images were obtained from the base of the skull through the vertex without intravenous contrast. COMPARISON:  None. FINDINGS: Brain: There is mild diffuse atrophy. There is no intracranial mass, hemorrhage, extra-axial fluid collection, or midline shift. There is patchy small vessel disease in the centra semiovale bilaterally. There is mild small vessel disease in the thalamic regions. No acute appearing infarct evident on this study. Vascular: There is no appreciable hyperdense vessel. There is calcification in each carotid siphon region. Skull: The bony calvarium appears intact. Sinuses/Orbits: There is slight mucosal thickening in each maxillary antrum. There is mild mucosal thickening in several ethmoid air cells. Orbits appear symmetric bilaterally. Other: Mastoid air cells are clear. There is debris in each external auditory canal. IMPRESSION: Mild atrophy with mild patchy periventricular small vessel disease. No acute infarct. No mass or hemorrhage. Foci of arterial vascular calcification noted. Mucosal thickening noted in several ethmoid air cells. Probable cerumen in each external auditory canal. Electronically Signed   By: Lowella Grip III M.D.   On: 03/10/2019 14:24   Pelvis Portable  Result Date: 03/11/2019 CLINICAL DATA:  Left hip hemiarthroplasty EXAM: PORTABLE PELVIS 1-2 VIEWS COMPARISON:  None. FINDINGS:  Status post hemiarthroplasty of the left hip with expected postoperative gas in the soft tissues. Alignment is normal. No periprosthetic fracture. Moderate right hip osteoarthrosis. IMPRESSION: Expected postoperative appearance following left hip hemiarthroplasty. Electronically Signed   By: Ulyses Jarred M.D.   On: 03/11/2019 14:57   Ct Hip Left Wo Contrast  Result Date: 03/10/2019 CLINICAL DATA:  Left hip pain since Brytni Dray fall 3 days ago. Initial encounter. EXAM: CT OF THE LEFT HIP WITHOUT CONTRAST TECHNIQUE: Multidetector CT imaging of the left hip was performed according to the standard protocol. Multiplanar CT image reconstructions were also generated. COMPARISON:  Plain films left  hip this same day. FINDINGS: Bones/Joint/Cartilage The patient has an acute subcapital fracture of the left hip. There is impaction posteriorly approximately 1.5 cm and anterior displacement of the femoral neck of 1 cm. No other fracture is identified. No focal bony lesion. Ligaments Suboptimally assessed by CT. Muscles and Tendons Intact. Soft tissues Imaged intrapelvic contents demonstrate some sigmoid diverticulosis. The patient is status post hysterectomy. IMPRESSION: Acute subcapital fracture of the left hip demonstrates mild impaction and anterior displacement of femoral neck. Electronically Signed   By: Inge Rise M.D.   On: 03/10/2019 14:50   Dg Hip Unilat W Or Wo Pelvis 2-3 Views Left  Result Date: 03/10/2019 CLINICAL DATA:  Left hip pain.  No known injury. EXAM: DG HIP (WITH OR WITHOUT PELVIS) 2-3V LEFT COMPARISON:  None. FINDINGS: There is no acute bony or joint abnormality. No avascular necrosis of the femoral heads or focal lesion. Moderate to moderately severe degenerative disease about the hips appears worse on the right. Soft tissues are unremarkable. Spinal stimulator is noted. IMPRESSION: No acute finding. Moderate to moderately severe bilateral hip osteoarthritis appears worse on the right. Electronically  Signed   By: Inge Rise M.D.   On: 03/10/2019 11:54    Microbiology: Recent Results (from the past 240 hour(s))  Culture, Urine     Status: Abnormal   Collection Time: 03/10/19  4:42 PM  Result Value Ref Range Status   Specimen Description   Final    URINE, RANDOM Performed at [facility] [address] [place], [place] [address]    Special Requests   Final    NONE Performed at [facility], [place] [address], [place], [place] [address]    Culture >=100,000 COLONIES/mL ESCHERICHIA COLI (Azazel Franze)  Final   Report Status 03/12/2019 FINAL  Final   Organism ID, Bacteria ESCHERICHIA COLI (Keana Dueitt)  Final      Susceptibility   Escherichia coli - MIC*    AMPICILLIN 4 SENSITIVE Sensitive     CEFAZOLIN <=4 SENSITIVE Sensitive     CEFTRIAXONE <=1 SENSITIVE Sensitive     CIPROFLOXACIN <=0.25 SENSITIVE Sensitive     GENTAMICIN <=1 SENSITIVE Sensitive     IMIPENEM <=0.25 SENSITIVE Sensitive     NITROFURANTOIN <=16 SENSITIVE Sensitive     TRIMETH/SULFA <=20 SENSITIVE Sensitive     AMPICILLIN/SULBACTAM <=2 SENSITIVE Sensitive     PIP/TAZO <=4 SENSITIVE Sensitive     Extended ESBL NEGATIVE Sensitive     * >=100,000 COLONIES/mL ESCHERICHIA COLI  Surgical pcr screen     Status: None   Collection Time: 03/10/19  9:54 PM  Result Value Ref Range Status   MRSA, PCR NEGATIVE NEGATIVE Final   Staphylococcus aureus NEGATIVE NEGATIVE Final    Comment: (NOTE) The Xpert SA Assay (FDA approved for NASAL specimens in patients 29 years of age and older), is one component of Devanta Daniel comprehensive surveillance program. It is not intended to diagnose infection nor to guide or monitor treatment. Performed at [facility], [place] [address], [place], [place] [address]      Labs: Basic Metabolic Panel: Recent Labs  Lab 03/10/19 1200 03/11/19 0336 03/12/19 0810 03/13/19 0227 03/14/19 0331 03/15/19 0431  NA  --  141 139 140 140 142  K  --  4.0 4.6 3.7 3.1* 4.1  CL  --   106 108 107 103 105  CO2  --  23 18* 22 24 26   GLUCOSE  --  118* 119* 130* 111* 102*  BUN  --  15 19 17 12 12   CREATININE  --  0.54 0.73 0.74 0.46 0.48  CALCIUM  --  8.9 8.5* 8.8* 8.7* 8.9  MG 2.0  --   --  2.0 1.8 1.9   Liver Function Tests: Recent Labs  Lab 03/11/19 0336 03/13/19 0227 03/15/19 0431  AST 25 28 22   ALT 22 17 20   ALKPHOS 51 58 58  BILITOT 0.7 0.6 0.6  PROT 5.7* 5.6* 5.4*  ALBUMIN 3.3* 3.0* 2.7*   No results for input(s): LIPASE, AMYLASE in the last 168 hours. No results for input(s): AMMONIA in the last 168 hours. CBC: Recent Labs  Lab 03/10/19 1159 03/11/19 0336 03/12/19 0810 03/13/19 0227 03/14/19 0331 03/15/19 0431  WBC 12.6* 9.2 14.9* 12.0* 9.6 9.6  NEUTROABS 9.5*  --   --   --   --   --   HGB 10.6* 9.6* 9.4* 8.6* 8.3* 8.7*  HCT 34.0* 32.2* 33.3* 29.2* 27.5* 28.4*  MCV 96.3 95.0 101.2* 96.7 96.5 95.6  PLT 178 164 191 202 167 192   Cardiac Enzymes: No results for input(s): CKTOTAL, CKMB, CKMBINDEX, TROPONINI in the last 168 hours. BNP: BNP (last 3 results) No results for input(s): BNP in the last 8760 hours.  ProBNP (last 3 results) No results for input(s): PROBNP in the last 8760 hours.  CBG: No results for input(s): GLUCAP in the last 168 hours.     Signed:  Fayrene Helper MD.  [facility] 03/15/2019, 2:32 PM

## 2019-03-15 NOTE — H&P (Signed)
Physical Medicine and Rehabilitation Admission H&P    Chief complaint:hip pain  HPI: Tamara Gregory is a 85 year old right-handed female with history of  Colon cancer with resection,Chronic back pain followed by Dr. Maryjean Ka with spinal cord stimulator, hypertension, hyperlipidemia,oocular myasthenia gravis. Per chart review patient lives with spouse. Used a wheelchair and walker prior to admission. Husband assists with some basic ADLs. One level home. Presented 03/10/2019 with fall 2 without loss of consciousness. Reports of left hip pain.CT of the hip showed acute subcapital fracture left hip mild impaction and anterior displacement of femoral neck. Cranial CT scan was negative.underwent left hip bipolar hip hemiarthroplasty 03/11/2019 per Dr. Percell Miller. Weightbearing as tolerated with posterior hip precautions. Hospital course pain management.Acute blood loss anemia 8.7. subcutaneous Lovenox for DVT prophylaxis. Escherichia coli UTI completing a course of Keflex. Therapy evaluations completed with recommendations of physical medicine rehabilitation consult. Patient was admitted for a comprehensive rehabilitation program.  Review of Systems  Constitutional: Negative for chills and fever.  HENT: Positive for hearing loss. Negative for tinnitus.   Eyes: Negative for blurred vision and double vision.  Respiratory: Negative for cough and shortness of breath.   Cardiovascular: Positive for leg swelling. Negative for chest pain and palpitations.  Gastrointestinal: Positive for constipation. Negative for heartburn, nausea and vomiting.       GERD  Genitourinary: Negative for dysuria, flank pain and hematuria.  Musculoskeletal: Positive for back pain and falls.  Skin: Negative for rash.  Neurological: Positive for headaches.  Psychiatric/Behavioral: The patient has insomnia.        Anxiety  All other systems reviewed and are negative.  Past Medical History:  Diagnosis Date  . Anemia   .  Anxiety   . Arthritis   . Cancer (HCC)    hx of skin cancer , colon   . Cataract    removed both eyes   . Chronic back pain    okay with sitting, increased back pain with activity   . Depression   . GERD (gastroesophageal reflux disease)   . Glaucoma   . Headache(784.0)    hx of  . History of kidney stones   . History of migraines   . Hyperlipidemia   . Hypertension   . Macular degeneration   . Myasthenia gravis ([place])   . Occasional tremors   . Pneumonia   . Spinal stenosis   . Thoracic aneurysm    4.4cm; see CT done 01/28/12 and 01/22/13 in [facility].   [name] Uses hearing aid   . Vitamin D deficiency    Past Surgical History:  Procedure Laterality Date  . ABDOMINAL HYSTERECTOMY    . CHOLECYSTECTOMY    . COLON SURGERY     Right colectomy dr. gross 03-17-18  . HIP ARTHROPLASTY Left 03/11/2019   Procedure: ARTHROPLASTY BIPOLAR HIP (HEMIARTHROPLASTY);  Surgeon: Renette Butters, MD;  Location: [facility];  Service: Orthopedics;  Laterality: Left;  . LUMBAR LAMINECTOMY/DECOMPRESSION MICRODISCECTOMY N/A 02/04/2013   Procedure: CENTRAL DECOMPRESSION/LUMBAR LAMINECTOMY L3-L4 AND L4-L5 2 LEVELS;  Surgeon: Tobi Bastos, MD;  Location: [facility];  Service: Orthopedics;  Laterality: N/A;  . SPINAL CORD STIMULATOR IMPLANT     in right hip but not currently using because it did not help  . TONSILLECTOMY    . UPPER GASTROINTESTINAL ENDOSCOPY    . US ECHOCARDIOGRAPHY  05/18/2007   EF 55-60%   Family History  Problem Relation Age of Onset  . Heart attack Father   . Diabetes Father   .  Cancer Sister   . Breast cancer Sister   . Cancer Brother   . Lung cancer Brother   . Cancer Sister   . Liver cancer Sister   . Diabetes Sister   . Colon polyps Neg Hx   . Colon cancer Neg Hx   . Esophageal cancer Neg Hx   . Rectal cancer Neg Hx   . Stomach cancer Neg Hx   . Adrenal disorder Neg Hx    Social History:  reports that she has never smoked. She has never used smokeless tobacco. She reports that she  does not drink alcohol or use drugs. Allergies:  Allergies  Allergen Reactions  . Tramadol Other (See Comments)    delirium   Medications Prior to Admission  Medication Sig Dispense Refill  . acetaminophen (TYLENOL) 500 MG tablet Take 2 tablets (1,000 mg total) by mouth 3 (three) times daily. 30 tablet 0  . aspirin 81 MG chewable tablet Chew 0.5 tablets (40.5 mg total) by mouth daily. 30 tablet 0  . brimonidine (ALPHAGAN) 0.2 % ophthalmic solution Place 1 drop into both eyes 3 (three) times daily.     . Calcium Carbonate (CALCIUM 500 PO) Take 500 mg by mouth daily.     . Cholecalciferol (VITAMIN D) 125 MCG (5000 UT) CAPS Take 5,000 Units by mouth daily.     . fluticasone (FLONASE) 50 MCG/ACT nasal spray Place 1 spray into both nostrils at bedtime.    . Glucosamine-Chondroit-Vit C-Mn (GLUCOSAMINE 1500 COMPLEX PO) Take 1 tablet by mouth daily.     [name] latanoprost (XALATAN) 0.005 % ophthalmic solution Place 1 drop into the left eye at bedtime.  1  . losartan-hydrochlorothiazide (HYZAAR) 50-12.5 MG tablet Take 1 tablet by mouth daily. 90 tablet 3  . Multiple Vitamins-Minerals (PRESERVISION AREDS) CAPS Take 1 capsule by mouth 2 (two) times daily.     . Omega-3 Fatty Acids (FISH OIL) 435 MG CAPS Take 435 mg by mouth daily.    . predniSONE (DELTASONE) 10 MG tablet Take 2 tablets (20 mg total) by mouth daily with breakfast. (Patient taking differently: Take 10 mg by mouth daily with breakfast. ) 180 tablet 3  . rosuvastatin (CRESTOR) 5 MG tablet TAKE 1 TABLET EVERY DAY IN THE EVENING 90 tablet 1  . sertraline (ZOLOFT) 100 MG tablet Take 1.5 tablets (150 mg total) by mouth at bedtime. (Patient taking differently: Take 200 mg by mouth at bedtime. ) 135 tablet 3    Drug Regimen Review Drug regimen was reviewed and remains appropriate with no significant issues identified  Home: Home Living Family/patient expects to be discharged to:: Private residence Living Arrangements: Spouse/significant other  Available Help at Discharge: Family, Available 24 hours/day(spouse is 4) Type of Home: House Home Access: Stairs to enter, Ramped entrance Technical brewer of Steps: 1 Home Layout: One level Bathroom Shower/Tub: Multimedia programmer: Handicapped height Bathroom Accessibility: Yes Home Equipment: Civil engineer, contracting, Environmental consultant - 2 wheels, Wheelchair - manual   Functional History: Prior Function Level of Independence: Needs assistance Gait / Transfers Assistance Needed: use wheelchair or walker for <40 feet ADL's / Homemaking Assistance Needed: husband bathes and dresses her and does iADLS pt reports due to excessive sweat  Functional Status:  Mobility: Bed Mobility Overal bed mobility: Needs Assistance Bed Mobility: Sidelying to Sit Sidelying to sit: Mod assist General bed mobility comments: advancing LLE with assist Transfers Overall transfer level: Needs assistance Equipment used: Rolling walker (2 wheeled) Transfers: Sit to/from Stand Sit to Stand: Max assist,  From elevated surface General transfer comment: Mod to maxA for sit to stand and able to advance weight on for ambulation to recliner.      ADL: ADL Overall ADL's : Needs assistance/impaired Eating/Feeding: Set up, Sitting Grooming: Set up Upper Body Bathing: Minimal assistance Lower Body Bathing: Maximal assistance, Total assistance, Sitting/lateral leans, Sit to/from stand Upper Body Dressing : Minimal assistance, Sitting, Standing Lower Body Dressing: Maximal assistance, Total assistance, Sitting/lateral leans, Sit to/from stand, +2 for physical assistance, +2 for safety/equipment, Cueing for safety Toilet Transfer: Maximal assistance, BSC, Ambulation Toileting- Clothing Manipulation and Hygiene: Maximal assistance, Sit to/from stand, Sitting/lateral lean Functional mobility during ADLs: Maximal assistance, Rolling walker, Cueing for safety, Cueing for sequencing General ADL Comments: Pt did well with UB  ADL and assisted with LB ADL, but maxA for safety with standing and LB ADL. pt using RW to advance steps to transfer to recliner.  Cognition: Cognition Overall Cognitive Status: Within Functional Limits for tasks assessed Orientation Level: Oriented to person, Oriented to situation, Oriented to place, Disoriented to time Cognition Arousal/Alertness: Awake/alert Behavior During Therapy: WFL for tasks assessed/performed Overall Cognitive Status: Within Functional Limits for tasks assessed  Physical Exam: Blood pressure (!) 154/83, pulse 68, temperature 99 F (37.2 C), temperature source Oral, resp. rate 14, height 5\' 6"  (1.676 m), weight 66.9 kg, SpO2 95 %. Physical Exam  Constitutional: She is oriented to person, place, and time. No distress.  HENT:  Head: Normocephalic and atraumatic.  Eyes: Pupils are equal, round, and reactive to light.  Neck: Normal range of motion. Neck supple.  Cardiovascular: Normal rate and regular rhythm.  Respiratory: No respiratory distress. She has no wheezes.  GI: Soft. She exhibits no distension.  Musculoskeletal: Normal range of motion.  Neurological: She is alert and oriented to person, place, and time. She has normal reflexes.  Patient is alert sitting up in chair reading the newspaper. Makes good eye contact with examiner. Provides her name and age. Limited but fair medical historian. UE 5/5. RLE 4/5 prox to distal. LLE 2+ HF, KE and 4/5 ADF/PF. No sensory findings.   Skin: Skin is warm. She is not diaphoretic.  Left hip incision CDI. Multiple lacs/bruises LLE>RLE  Psychiatric: She has a normal mood and affect. Her behavior is normal. Thought content normal.    Results for orders placed or performed during the hospital encounter of 03/10/19 (from the past 48 hour(s))  CBC     Status: Abnormal   Collection Time: 03/14/19  3:31 AM  Result Value Ref Range   WBC 9.6 4.0 - 10.5 K/uL   RBC 2.85 (L) 3.87 - 5.11 MIL/uL   Hemoglobin 8.3 (L) 12.0 - 15.0  g/dL   HCT 27.5 (L) 36.0 - 46.0 %   MCV 96.5 80.0 - 100.0 fL   MCH 29.1 26.0 - 34.0 pg   MCHC 30.2 30.0 - 36.0 g/dL   RDW 16.8 (H) 11.5 - 15.5 %   Platelets 167 150 - 400 K/uL   nRBC 0.0 0.0 - 0.2 %    Comment: Performed at [facility], [place] [address] [place],  [address]  Basic metabolic panel     Status: Abnormal   Collection Time: 03/14/19  3:31 AM  Result Value Ref Range   Sodium 140 135 - 145 mmol/L   Potassium 3.1 (L) 3.5 - 5.1 mmol/L   Chloride 103 98 - 111 mmol/L   CO2 24 22 - 32 mmol/L   Glucose, Bld 111 (H) 70 -  99 mg/dL   BUN 12 8 - 23 mg/dL   Creatinine, Ser 0.46 0.44 - 1.00 mg/dL   Calcium 8.7 (L) 8.9 - 10.3 mg/dL   GFR calc non Af Amer >60 >60 mL/min   GFR calc Af Amer >60 >60 mL/min   Anion gap 13 5 - 15    Comment: Performed at [facility] [address] [place], [place] [address]  Magnesium     Status: None   Collection Time: 03/14/19  3:31 AM  Result Value Ref Range   Magnesium 1.8 1.7 - 2.4 mg/dL    Comment: Performed at [facility] [address], [place], [place] [address]  CBC     Status: Abnormal   Collection Time: 03/15/19  4:31 AM  Result Value Ref Range   WBC 9.6 4.0 - 10.5 K/uL   RBC 2.97 (L) 3.87 - 5.11 MIL/uL   Hemoglobin 8.7 (L) 12.0 - 15.0 g/dL   HCT 28.4 (L) 36.0 - 46.0 %   MCV 95.6 80.0 - 100.0 fL   MCH 29.3 26.0 - 34.0 pg   MCHC 30.6 30.0 - 36.0 g/dL   RDW 16.5 (H) 11.5 - 15.5 %   Platelets 192 150 - 400 K/uL   nRBC 0.2 0.0 - 0.2 %    Comment: Performed at [facility], [place] [address] [place], [place] [address]  Comprehensive metabolic panel     Status: Abnormal   Collection Time: 03/15/19  4:31 AM  Result Value Ref Range   Sodium 142 135 - 145 mmol/L   Potassium 4.1 3.5 - 5.1 mmol/L   Chloride 105 98 - 111 mmol/L   CO2 26 22 - 32 mmol/L   Glucose, Bld 102 (H) 70 - 99 mg/dL   BUN 12 8 - 23 mg/dL   Creatinine, Ser 0.48 0.44 - 1.00 mg/dL   Calcium 8.9 8.9 - 10.3 mg/dL   Total Protein 5.4 (L)  6.5 - 8.1 g/dL   Albumin 2.7 (L) 3.5 - 5.0 g/dL   AST 22 15 - 41 U/L   ALT 20 0 - 44 U/L   Alkaline Phosphatase 58 38 - 126 U/L   Total Bilirubin 0.6 0.3 - 1.2 mg/dL   GFR calc non Af Amer >60 >60 mL/min   GFR calc Af Amer >60 >60 mL/min   Anion gap 11 5 - 15    Comment: Performed at [facility] [address], [place], [place] [address]  Magnesium     Status: None   Collection Time: 03/15/19  4:31 AM  Result Value Ref Range   Magnesium 1.9 1.7 - 2.4 mg/dL    Comment: Performed at [facility] [address], [place], [place] [address]   No results found.     Medical Problem List and Plan: 1.  Decreased functional mobility secondary to left displaced femoral neck fracture .Status post bipolar hip hemiarthroplasty.   -admit to inpatient rehab  -Posterior hip precautions weightbearing as tolerated 2.  Antithrombotics: -DVT/anticoagulation:  Subcutaneous Lovenox. Check vascular study  -antiplatelet therapy: N/A 3. Pain Management/chronic back pain: Hydrocodone as needed 4. Mood: provide emotional support  -antipsychotic agents: N/A 5. Neuropsych: This patient is capable of making decisions on her own behalf. 6. Skin/Wound Care:  Routine skin checks,   -local wound care to hip incision numerous abrasions/lacs in LE's 7. Fluids/Electrolytes/Nutrition:  Routine in and out's with follow-up chemistries 8. Acute blood loss anemia. Follow-up CBC 9. History  of ocular myasthenia gravis. Continue chronic prednisone 10. Hypertension. HCTZ 12.5 mg daily 11. Escherichia coli UTI. Complete course of Keflex 12. Hyperlipidemia. Crestor 13. Constipation. Laxative assistance     Cathlyn Parsons, PA-C 03/15/2019

## 2019-03-16 ENCOUNTER — Inpatient Hospital Stay (HOSPITAL_COMMUNITY): Payer: Medicare Other | Admitting: Occupational Therapy

## 2019-03-16 ENCOUNTER — Inpatient Hospital Stay (HOSPITAL_COMMUNITY): Payer: Medicare Other | Admitting: Physical Therapy

## 2019-03-16 ENCOUNTER — Inpatient Hospital Stay (HOSPITAL_COMMUNITY): Payer: Medicare Other

## 2019-03-16 ENCOUNTER — Telehealth: Payer: Self-pay | Admitting: *Deleted

## 2019-03-16 DIAGNOSIS — I10 Essential (primary) hypertension: Secondary | ICD-10-CM

## 2019-03-16 DIAGNOSIS — D72829 Elevated white blood cell count, unspecified: Secondary | ICD-10-CM

## 2019-03-16 DIAGNOSIS — G7000 Myasthenia gravis without (acute) exacerbation: Secondary | ICD-10-CM

## 2019-03-16 DIAGNOSIS — B9620 Unspecified Escherichia coli [E. coli] as the cause of diseases classified elsewhere: Secondary | ICD-10-CM

## 2019-03-16 DIAGNOSIS — E46 Unspecified protein-calorie malnutrition: Secondary | ICD-10-CM

## 2019-03-16 DIAGNOSIS — M7989 Other specified soft tissue disorders: Secondary | ICD-10-CM

## 2019-03-16 DIAGNOSIS — D649 Anemia, unspecified: Secondary | ICD-10-CM

## 2019-03-16 DIAGNOSIS — E8809 Other disorders of plasma-protein metabolism, not elsewhere classified: Secondary | ICD-10-CM

## 2019-03-16 DIAGNOSIS — N390 Urinary tract infection, site not specified: Secondary | ICD-10-CM

## 2019-03-16 DIAGNOSIS — D62 Acute posthemorrhagic anemia: Secondary | ICD-10-CM

## 2019-03-16 LAB — COMPREHENSIVE METABOLIC PANEL
ALT: 20 U/L (ref 0–44)
AST: 21 U/L (ref 15–41)
Albumin: 2.7 g/dL — ABNORMAL LOW (ref 3.5–5.0)
Alkaline Phosphatase: 61 U/L (ref 38–126)
Anion gap: 12 (ref 5–15)
BUN: 11 mg/dL (ref 8–23)
CO2: 24 mmol/L (ref 22–32)
Calcium: 8.7 mg/dL — ABNORMAL LOW (ref 8.9–10.3)
Chloride: 103 mmol/L (ref 98–111)
Creatinine, Ser: 0.56 mg/dL (ref 0.44–1.00)
GFR calc Af Amer: >60 mL/min (ref >60)
GFR calc non Af Amer: >60 mL/min (ref >60)
Glucose, Bld: 110 mg/dL — ABNORMAL HIGH (ref 70–99)
Potassium: 3.9 mmol/L (ref 3.5–5.1)
Sodium: 139 mmol/L (ref 135–145)
Total Bilirubin: 0.7 mg/dL (ref 0.3–1.2)
Total Protein: 5.6 g/dL — ABNORMAL LOW (ref 6.5–8.1)

## 2019-03-16 LAB — CBC WITH DIFFERENTIAL/PLATELET
Abs Immature Granulocytes: 0.21 10*3/uL — ABNORMAL HIGH (ref 0.00–0.07)
Basophils Absolute: 0 10*3/uL (ref 0.0–0.1)
Basophils Relative: 0 %
Eosinophils Absolute: 0.1 10*3/uL (ref 0.0–0.5)
Eosinophils Relative: 1 %
HCT: 30.3 % — ABNORMAL LOW (ref 36.0–46.0)
Hemoglobin: 9.3 g/dL — ABNORMAL LOW (ref 12.0–15.0)
Immature Granulocytes: 2 %
Lymphocytes Relative: 11 %
Lymphs Abs: 1.3 10*3/uL (ref 0.7–4.0)
MCH: 29.7 pg (ref 26.0–34.0)
MCHC: 30.7 g/dL (ref 30.0–36.0)
MCV: 96.8 fL (ref 80.0–100.0)
Monocytes Absolute: 1.9 10*3/uL — ABNORMAL HIGH (ref 0.1–1.0)
Monocytes Relative: 17 %
Neutro Abs: 8 10*3/uL — ABNORMAL HIGH (ref 1.7–7.7)
Neutrophils Relative %: 69 %
Platelets: 211 10*3/uL (ref 150–400)
RBC: 3.13 MIL/uL — ABNORMAL LOW (ref 3.87–5.11)
RDW: 16.9 % — ABNORMAL HIGH (ref 11.5–15.5)
WBC: 11.5 10*3/uL — ABNORMAL HIGH (ref 4.0–10.5)
nRBC: 0 % (ref 0.0–0.2)

## 2019-03-16 LAB — GLUCOSE, CAPILLARY: Glucose-Capillary: 111 mg/dL — ABNORMAL HIGH (ref 70–99)

## 2019-03-16 MED ORDER — GERHARDT'S BUTT CREAM
TOPICAL_CREAM | Freq: Two times a day (BID) | CUTANEOUS | Status: DC
  Administered 2019-03-16 – 2019-03-28 (×20): via TOPICAL
  Filled 2019-03-16: qty 1

## 2019-03-16 MED ORDER — PRO-STAT SUGAR FREE PO LIQD
30.0000 mL | Freq: Two times a day (BID) | ORAL | Status: DC
  Administered 2019-03-16 – 2019-03-29 (×27): 30 mL via ORAL
  Filled 2019-03-16 (×27): qty 30

## 2019-03-16 NOTE — Evaluation (Signed)
Occupational Therapy Assessment and Plan  Patient Details  Name: AYZIA DAY MRN: [id number] Date of Birth: February 13, 1925  OT Diagnosis: muscle weakness (generalized) and THR Rehab Potential: Rehab Potential (ACUTE ONLY): Good ELOS: 2 weeks   Today's Date: 03/16/2019 OT Individual Time: 9211-9417 OT Individual Time Calculation (min): 75 min     Problem List:  Patient Active Problem List   Diagnosis Date Noted  . Leukocytosis   . E. coli UTI   . Hypoalbuminemia due to protein-calorie malnutrition ([place])   . Essential hypertension   . Ocular myasthenia gravis ([place])   . Acute blood loss anemia   . Preoperative cardiovascular examination   . Hip fracture ([place]) 03/10/2019  . Left displaced femoral neck fracture ([place]) 03/10/2019  . Weakness 12/04/2018  . Hypokalemia 03/18/2018  . 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 Right ovarian cyst s/p RSO 03/17/2018  . Diarrhea 12/23/2017  . Large hiatal hernia 04/01/2017  . Sweating profusely 03/19/2017  . Hyperglycemia 03/19/2017  . Squamous cell skin cancer 11/05/2016  . Nosebleed 10/19/2016  . Osteopenia 08/28/2016  . Essential hypertension, benign 08/21/2016  . Long term current use of systemic steroids 08/21/2016  . GERD (gastroesophageal reflux disease) 05/06/2016  . Mixed incontinence urge and stress 05/06/2016  . Low hemoglobin 09/22/2014  . Insomnia 07/26/2014  . Malaise and fatigue 10/04/2013  . PAC (premature atrial contraction) 10/04/2013  . Depression 02/15/2013  . Spinal stenosis, lumbar region, with neurogenic claudication 02/04/2013  . Myasthenia gravis ([place]) 03/17/2012  . Pure hypercholesterolemia 09/05/2011  . Benign hypertensive heart disease without heart failure 09/05/2011  . Osteoarthritis 09/05/2011    Past Medical History:  Past Medical History:  Diagnosis Date  . Anemia   . Anxiety   . Arthritis   . Cancer (HCC)    hx of skin cancer , colon   . Cataract    removed both  eyes   . Chronic back pain    okay with sitting, increased back pain with activity   . Depression   . GERD (gastroesophageal reflux disease)   . Glaucoma   . Headache(784.0)    hx of  . History of kidney stones   . History of migraines   . Hyperlipidemia   . Hypertension   . Macular degeneration   . Myasthenia gravis ([place])   . Occasional tremors   . Pneumonia   . Spinal stenosis   . Thoracic aneurysm    4.4cm; see CT done 01/28/12 and 01/22/13 in [facility].   [name] Uses hearing aid   . Vitamin D deficiency    Past Surgical History:  Past Surgical History:  Procedure Laterality Date  . ABDOMINAL HYSTERECTOMY    . CHOLECYSTECTOMY    . COLON SURGERY     Right colectomy dr. gross 03-17-18  . HIP ARTHROPLASTY Left 03/11/2019   Procedure: ARTHROPLASTY BIPOLAR HIP (HEMIARTHROPLASTY);  Surgeon: Renette Butters, MD;  Location: [facility];  Service: Orthopedics;  Laterality: Left;  . LUMBAR LAMINECTOMY/DECOMPRESSION MICRODISCECTOMY N/A 02/04/2013   Procedure: CENTRAL DECOMPRESSION/LUMBAR LAMINECTOMY L3-L4 AND L4-L5 2 LEVELS;  Surgeon: Tobi Bastos, MD;  Location: [facility];  Service: Orthopedics;  Laterality: N/A;  . SPINAL CORD STIMULATOR IMPLANT     in right hip but not currently using because it did not help  . TONSILLECTOMY    . UPPER GASTROINTESTINAL ENDOSCOPY    . US ECHOCARDIOGRAPHY  05/18/2007   EF 55-60%    Assessment & Plan Clinical Impression: Patient is  a 85 y.o. year old female with history of Colon cancer with resection,Chronic back pain followed by Dr. Maryjean Ka with spinal cord stimulator, hypertension, hyperlipidemia,oocular myasthenia gravis. Per chart review patient lives with spouse. Used a wheelchair and walker prior to admission. Husband assists with some basic ADLs. One level home. Presented 03/10/2019 with fall 2 without loss of consciousness. Reports of left hip pain.CT of the hip showed acute subcapital fracture left hip mild impaction and anterior displacement of femoral neck.  Cranial CT scan was negative.underwent left hip bipolar hip hemiarthroplasty 03/11/2019 per Dr. Percell Miller. Weightbearing as tolerated with posterior hip precautions. Hospital course pain management.Acute blood loss anemia 8.7. subcutaneous Lovenox for DVT prophylaxis. Escherichia coli UTI completing a course of Keflex.  Patient transferred to CIR on 03/15/2019 .    Patient currently requires max with basic self-care skills secondary to muscle weakness, decreased safety awareness and decreased memory and decreased standing balance and difficulty maintaining precautions.  Prior to hospitalization, patient could complete basic ADL with min.  Patient will benefit from skilled intervention to decrease level of assist with basic self-care skills and increase independence with basic self-care skills prior to discharge home with care partner.  Anticipate patient will require 24 hour supervision and minimal physical assistance and follow up home health.  OT - End of Session Activity Tolerance: Tolerates 10 - 20 min activity with multiple rests Endurance Deficit: Yes Endurance Deficit Description: fatigue noted with basic self care tasks, requests to go back to bed  OT Assessment Rehab Potential (ACUTE ONLY): Good OT Patient demonstrates impairments in the following area(s): Balance;Endurance;Safety;Cognition OT Basic ADL's Functional Problem(s): Eating;Grooming;Bathing;Dressing;Toileting OT Transfers Functional Problem(s): Toilet;Tub/Shower OT Plan OT Intensity: Minimum of 1-2 x/day, 45 to 90 minutes OT Frequency: Total of 15 hours over 7 days of combined therapies OT Duration/Estimated Length of Stay: 2 weeks OT Treatment/Interventions: Balance/vestibular training;Patient/family education;Self Care/advanced ADL retraining;Therapeutic Exercise;Cognitive remediation/compensation;Discharge planning;DME/adaptive equipment instruction;Functional mobility training;Therapeutic Activities OT Self Feeding Anticipated  Outcome(s): independent OT Basic Self-Care Anticipated Outcome(s): min a OT Toileting Anticipated Outcome(s): CG/min A OT Bathroom Transfers Anticipated Outcome(s): CS OT Recommendation Patient destination: Home Follow Up Recommendations: Home health OT Equipment Recommended: To be determined   Skilled Therapeutic Intervention Patient in bed upon arrival.  She denies pain t/o therapy session.  She demonstrates mild confusion, she is able to recall 1/3 THPs.  Reviewed role of OT, evaluation process, therapy schedule and plan of care.  Patient participated in goal setting but recall is impaired.  Family education and repeat of information will be necessary.  Evaluation completed as documented below.  LB bathing and dressing completed at the bed level max / dependent.  Bed mobility with mod/max A and cues for technique, SPT with RW bed to w/c mod A with cues for sequencing and safe use of RW.  UB bathing/dressing and grooming completed w/c level at sink min A.  Patient remained in w/c at close of session with seat belt alarm set and tray table/call bell in reach.    OT Evaluation Precautions/Restrictions  Precautions Precautions: Posterior Hip Precaution Comments: pt able to recall 1/3 precautions at CIR eval  Restrictions Weight Bearing Restrictions: Yes LLE Weight Bearing: Weight bearing as tolerated General Chart Reviewed: Yes PT Missed Treatment Reason: Patient unwilling to participate Vital Signs Therapy Vitals Temp: 98.3 F (36.8 C) Pulse Rate: (!) 56 Resp: 20 BP: (!) 153/84 Patient Position (if appropriate): Lying Oxygen Therapy SpO2: 92 % O2 Device: Room Air Pain Pain Assessment Pain Scale: 0-10 Pain Score:  0-No pain Faces Pain Scale: Hurts even more Pain Type: Acute pain;Surgical pain Pain Location: Hip Pain Orientation: Left Pain Descriptors / Indicators: Aching;Sharp Pain Onset: With Activity Patients Stated Pain Goal: 0 Pain Intervention(s):  Repositioned;Distraction;Emotional support Multiple Pain Sites: No Home Living/Prior Functioning Home Living Family/patient expects to be discharged to:: Private residence Living Arrangements: Spouse/significant other Available Help at Discharge: Available 24 hours/day, Family Type of Home: House Home Access: Stairs to enter, Ramped entrance Technical brewer of Steps: 1 Entrance Stairs-Rails: None Home Layout: One level Bathroom Shower/Tub: Multimedia programmer: Handicapped height Bathroom Accessibility: Yes  Lives With: Spouse Prior Function Level of Independence: Needs assistance with gait, Needs assistance with ADLs, Needs assistance with homemaking, Needs assistance with tranfers  Able to Take Stairs?: No Driving: No Vocation: Retired Biomedical scientist: reading, puzzles  Comments: spouse 91 and healthy, retired Pharmacist, community. Per patient report, spouse has been Insurance underwriter but therapy needs to confirm this  ADL ADL Eating: Supervision/safety Where Assessed-Eating: Bed level Grooming: Minimal assistance Where Assessed-Grooming: Sitting at sink, Wheelchair Upper Body Bathing: Minimal assistance Where Assessed-Upper Body Bathing: Wheelchair, Sitting at sink Lower Body Bathing: Maximal assistance Where Assessed-Lower Body Bathing: Bed level Upper Body Dressing: Minimal assistance Where Assessed-Upper Body Dressing: Wheelchair Lower Body Dressing: Dependent Where Assessed-Lower Body Dressing: Bed level Toileting: Other (Comment)(incontinent of urine in brief) Vision Baseline Vision/History: Wears glasses Wears Glasses: At all times Patient Visual Report: No change from baseline Vision Assessment?: No apparent visual deficits Perception  Perception: Within Functional Limits Praxis Praxis: Intact Cognition Overall Cognitive Status: No family/caregiver present to determine baseline cognitive functioning Arousal/Alertness: Awake/alert Orientation Level:  Person;Place;Situation Person: Oriented Place: Oriented Situation: Oriented Year: 2020 Month: November Day of Week: Correct Memory: Impaired Memory Impairment: Decreased recall of new information;Decreased short term memory Decreased Short Term Memory: Verbal basic;Functional basic Immediate Memory Recall: Sock;Blue;Bed Attention: Sustained Sustained Attention: Appears intact Sustained Attention Impairment: Verbal basic;Functional basic Awareness: Impaired Awareness Impairment: Emergent impairment Problem Solving: Impaired Problem Solving Impairment: Verbal basic;Functional basic Executive Function: Reasoning;Sequencing;Organizing;Decision Making;Initiating;Self Monitoring;Self Correcting Reasoning: Impaired Reasoning Impairment: Functional basic;Verbal basic Sequencing: Impaired Sequencing Impairment: Verbal basic;Functional basic Organizing: Impaired Organizing Impairment: Verbal basic;Functional basic Decision Making: Impaired Decision Making Impairment: Verbal basic;Functional basic Initiating: Impaired Initiating Impairment: Verbal basic;Functional basic Self Monitoring: Impaired Self Monitoring Impairment: Verbal basic;Functional basic Self Correcting: Impaired Self Correcting Impairment: Verbal basic;Functional basic Safety/Judgment: Impaired Sensation Sensation Light Touch: Appears Intact Hot/Cold: Appears Intact Proprioception: Appears Intact Stereognosis: Appears Intact Coordination Gross Motor Movements are Fluid and Coordinated: No Fine Motor Movements are Fluid and Coordinated: No Coordination and Movement Description: limited by gross weakness and pain  Finger Nose Finger Test: UE coordination adequate for basic self care and manipulation of adl items Motor  Motor Motor: Within Functional Limits Motor - Skilled Clinical Observations: general weakness and deconditioning  Mobility  Bed Mobility Bed Mobility: Rolling Right;Rolling Left Rolling Right:  Minimal Assistance - Patient > 75% Rolling Left: Supervision/Verbal cueing Supine to Sit: Maximal Assistance - Patient - Patient 25-49% Sit to Supine: Moderate Assistance - Patient 50-74% Transfers Sit to Stand: Moderate Assistance - Patient 50-74% Stand to Sit: Minimal Assistance - Patient > 75%  Trunk/Postural Assessment  Cervical Assessment Cervical Assessment: Exceptions to WFL(forward head ) Thoracic Assessment Thoracic Assessment: Exceptions to WFL(kyphotic ) Lumbar Assessment Lumbar Assessment: Exceptions to WFL(rounded ) Postural Control Postural Control: Within Functional Limits  Balance Balance Balance Assessed: Yes Static Sitting Balance Static Sitting - Balance Support: Right upper extremity supported;Left upper extremity supported;Feet supported  Static Sitting - Level of Assistance: 5: Stand by assistance Dynamic Sitting Balance Dynamic Sitting - Balance Support: Right upper extremity supported;Left upper extremity supported;Feet supported Dynamic Sitting - Level of Assistance: 4: Min assist Static Standing Balance Static Standing - Balance Support: Left upper extremity supported;Right upper extremity supported;During functional activity Static Standing - Level of Assistance: 4: Min assist Dynamic Standing Balance Dynamic Standing - Balance Support: Left upper extremity supported;Right upper extremity supported;During functional activity Dynamic Standing - Level of Assistance: 3: Mod assist Extremity/Trunk Assessment RUE Assessment RUE Assessment: Within Functional Limits General Strength Comments: general weakness  LUE Assessment LUE Assessment: Within Functional Limits General Strength Comments: general weakness      Refer to Care Plan for Long Term Goals  Recommendations for other services: None    Discharge Criteria: Patient will be discharged from OT if patient refuses treatment 3 consecutive times without medical reason, if treatment goals not met, if  there is a change in medical status, if patient makes no progress towards goals or if patient is discharged from hospital.  The above assessment, treatment plan, treatment alternatives and goals were discussed and mutually agreed upon: by patient  Carlos Levering 03/16/2019, 4:06 PM

## 2019-03-16 NOTE — Telephone Encounter (Signed)
Pt was on TCM report she was admitted 03/10/19 for a left hip fracture.  She's now s/p L hemiarthroplasty.  Post op course was c/b delirium, which has improved.  Pt D/C 03/15/19 to CIR. Per summary will also need to f/u w/PCP once she has been discharge from CIR.Marland KitchenJohny Chess

## 2019-03-16 NOTE — Progress Notes (Signed)
Made multiple attempts for afternoon session- on first attempt patient refused due to fatigue and wanting to eat lunch, on second attempt patient refused even low level bed activities due to fatigue. 45 minutes of skilled therapy time missed.   Deniece Ree PT, DPT, CBIS  Supplemental Physical Therapist [facility]    Pager [phone] Acute Rehab Office [phone]

## 2019-03-16 NOTE — Progress Notes (Signed)
Inpatient Rehabilitation  Patient information reviewed and entered into eRehab system by Caffie Sotto M. Velecia Ovitt, M.A., CCC/SLP, PPS Coordinator.  Information including medical coding, functional ability and quality indicators will be reviewed and updated through discharge.    

## 2019-03-16 NOTE — Progress Notes (Signed)
Lower extremity venous has been completed.   Preliminary results in CV Proc.   Abram Sander 03/16/2019 1:42 PM

## 2019-03-16 NOTE — IPOC Note (Signed)
Overall Plan of Care [facility]) Patient Details Name: Tamara Gregory MRN: [id number] DOB: 07-01-1925  Admitting Diagnosis: Left femoral neck fracture s/p bipolar hip hemiarthroplasty  Hospital Problems: Active Problems:   Left displaced femoral neck fracture (HCC)   Leukocytosis   E. coli UTI   Hypoalbuminemia due to protein-calorie malnutrition (HCC)   Essential hypertension   Ocular myasthenia gravis (HCC)   Acute blood loss anemia     Functional Problem List: Nursing Endurance, Pain, Safety, Skin Integrity  PT Safety, Balance, Skin Integrity, Endurance, Motor, Pain  OT Balance, Endurance, Safety, Cognition  SLP    TR         Basic ADL's: OT Eating, Grooming, Bathing, Dressing, Toileting     Advanced  ADL's: OT       Transfers: PT Bed Mobility, Bed to Chair, Car, [employer], Futures trader, Metallurgist: PT Ambulation, Emergency planning/management officer, Stairs     Additional Impairments: OT    SLP        TR      Anticipated Outcomes Item Anticipated Outcome  Self Feeding independent  Swallowing      Basic self-care  min a  Toileting  CG/min A   Bathroom Transfers CS  Bowel/Bladder  patient will be continent of bowel and bladder during admission  Transfers  MinA, RW   Locomotion  MinA, RW   Communication     Cognition     Pain  Patient will be pain free or pain less than 3 during admission  Safety/Judgment  Patient will be free from fall and adhere to safety plan   Therapy Plan: PT Intensity: Minimum of 1-2 x/day ,45 to 90 minutes PT Frequency: Total of 15 hours over 7 days of combined therapies PT Duration Estimated Length of Stay: 2 weeks  OT Intensity: Minimum of 1-2 x/day, 45 to 90 minutes OT Frequency: Total of 15 hours over 7 days of combined therapies OT Duration/Estimated Length of Stay: 2 weeks      Team Interventions: Nursing Interventions Patient/Family Education, Pain Management, Discharge Planning, Skin Care/Wound Management   PT interventions Community reintegration, Ambulation/gait training, DME/adaptive equipment instruction, Neuromuscular re-education, Psychosocial support, Stair training, UE/LE Strength taining/ROM, Wheelchair propulsion/positioning, Training and development officer, Discharge planning, Functional electrical stimulation, Pain management, Skin care/wound management, Therapeutic Activities, UE/LE Coordination activities, Cognitive remediation/compensation, Disease management/prevention, Functional mobility training, Patient/family education, Splinting/orthotics, Therapeutic Exercise, Visual/perceptual remediation/compensation  OT Interventions Training and development officer, Patient/family education, Self Care/advanced ADL retraining, Therapeutic Exercise, Cognitive remediation/compensation, Discharge planning, DME/adaptive equipment instruction, Functional mobility training, Therapeutic Activities  SLP Interventions    TR Interventions    SW/CM Interventions Discharge Planning, Psychosocial Support, Patient/Family Education   Barriers to Discharge MD  Medical stability  Nursing      PT      OT      SLP      SW       Team Discharge Planning: Destination: PT-Home ,OT- Home , SLP-  Projected Follow-up: PT-Home health PT, 24 hour supervision/assistance, OT-  Home health OT, SLP-  Projected Equipment Needs: PT-3 in 1 bedside comode, Tub/shower seat, Tub/shower bench, OT- To be determined, SLP-  Equipment Details: PT-already has RW and WC at home , OT-  Patient/family involved in discharge planning: PT- Patient,  OT-Patient, SLP-   MD ELOS: 10-14 days. Medical Rehab Prognosis:  Good Assessment: 85 year old right-handed female with history of Colon cancer with resection,Chronic back pain followed by Dr. Maryjean Ka with spinal cord stimulator, hypertension, hyperlipidemia, ocular myasthenia gravis.  Presented 03/10/2019 with fall 2 without loss of consciousness. Reports of left hip pain.CT of the hip showed  acute subcapital fracture left hip mild impaction and anterior displacement of femoral neck. Cranial CT scan was negative.underwent left hip bipolar hip hemiarthroplasty 03/11/2019 per Dr. Percell Miller. Weightbearing as tolerated with posterior hip precautions. Hospital course pain management.Acute blood loss anemia, Escherichia coli UTI completing a course of Keflex. Patient with resulting functional deficits with mobility, transfers, self-care.  Will set goals for Min A with PT/OT.  Due to the current state of emergency, patients may not be receiving their 3-hours of Medicare-mandated therapy.  See Team Conference Notes for weekly updates to the plan of care

## 2019-03-16 NOTE — Progress Notes (Signed)
[facility] PHYSICAL MEDICINE & REHABILITATION PROGRESS NOTE  Subjective/Complaints: Patient seen laying in bed this morning.  She states she did not sleep well overnight because she had a difficult time adjusting to her new bed.  She is confused about how long she is been here.  She states she is ready get out of bed.  ROS: Denies CP, shortness of breath, nausea, vomiting, diarrhea.  Objective: Vital Signs: Blood pressure (!) 166/96, pulse (!) 57, temperature 98.6 F (37 C), resp. rate 17, height 5\' 5"  (1.651 m), weight 67.5 kg, SpO2 (!) 88 %. No results found. Recent Labs    03/15/19 0431 03/16/19 0607  WBC 9.6 11.5*  HGB 8.7* 9.3*  HCT 28.4* 30.3*  PLT 192 211   Recent Labs    03/15/19 0431 03/16/19 0607  NA 142 139  K 4.1 3.9  CL 105 103  CO2 26 24  GLUCOSE 102* 110*  BUN 12 11  CREATININE 0.48 0.56  CALCIUM 8.9 8.7*    Physical Exam: BP (!) 166/96 (BP Location: Left Arm)   Pulse (!) 57   Temp 98.6 F (37 C)   Resp 17   Ht 5\' 5"  (1.651 m)   Wt 67.5 kg   SpO2 (!) 88%   BMI 24.76 kg/m  Constitutional: No distress . Vital signs reviewed. HENT: Normocephalic.  Atraumatic. Eyes: EOMI. No discharge. Cardiovascular: No JVD. Respiratory: Normal effort. GI: Non-distended. Musc: No edema or tenderness in extremities. Neurological: She isalertand oriented Makes good eye contact with examiner.  Motor: Bilateral UE 5/5 proximal to distal.  RLE 4-4+/5 prox to distal.  LLE 4--4/5 HF, KE and 4+/5 ADF/PF.  Skin: Left hip incision CDI.  Multiple scattered ecchymosis Psychiatric: Somewhat confused  Assessment/Plan: 1. Functional deficits secondary to left hip fracture status post bipolar hemiarthroplasty which require 3+ hours per day of interdisciplinary therapy in a comprehensive inpatient rehab setting.  Physiatrist is providing close team supervision and 24 hour management of active medical problems listed below.  Physiatrist and rehab team continue to assess  barriers to discharge/monitor patient progress toward functional and medical goals  Care Tool:  Bathing              Bathing assist       Upper Body Dressing/Undressing Upper body dressing Upper body dressing/undressing activity did not occur (including orthotics): N/A      Upper body assist      Lower Body Dressing/Undressing Lower body dressing    Lower body dressing activity did not occur: N/A       Lower body assist       Toileting Toileting Toileting Activity did not occur (Clothing management and hygiene only): N/A (no void or bm)  Toileting assist Assist for toileting: Moderate Assistance - Patient 50 - 74%     Transfers Chair/bed transfer  Transfers assist  Chair/bed transfer activity did not occur: N/A        Locomotion Ambulation   Ambulation assist              Walk 10 feet activity   Assist           Walk 50 feet activity   Assist           Walk 150 feet activity   Assist           Walk 10 feet on uneven surface  activity   Assist           Wheelchair     Assist  Wheelchair 50 feet with 2 turns activity    Assist            Wheelchair 150 feet activity     Assist            Medical Problem List and Plan: 1.Decreased functional mobilitysecondary to left displaced femoral neck fracture .Status post bipolar hip hemiarthroplasty.  Posterior hip precautions weightbearing as tolerated  Begin CIR  Notes reviewed- hip fracture after loss of consciousness, labs reviewed 2. Antithrombotics: -DVT/anticoagulation:Subcutaneous Lovenox.   Check vascular pending -antiplatelet therapy: N/A 3. Pain Management/chronic back pain:Hydrocodone as needed 4. Mood:provide emotional support -antipsychotic agents: N/A 5. Neuropsych: This patientis?  Fully capable of making decisions on herown behalf. 6. Skin/Wound Care:Routine skin checks,   -local wound care to hip incision numerous abrasions/lacs in LE's 7. Fluids/Electrolytes/Nutrition:Routine in and out's  BMP within acceptable range on 4/14 8. Acute blood loss anemia.   Hemoglobin 9.3 on 4/14  Continue to monitor 9. History of ocular myasthenia gravis. Continue chronic prednisone 10. Hypertension. HCTZ 12.5 mg daily  Monitor with increased mobility 11. Escherichia coli UTI.    Complete course of Keflex through 4/15 12. Hyperlipidemia. Crestor 13. Constipation. Laxative assistance 14.  Hypoalbuminemia  Supplement initiated on 4/14 15.  Leukocytosis  WBCs 11.5 on 4/14  Afebrile  Continue to monitor  LOS: 1 days A FACE TO FACE EVALUATION WAS PERFORMED  Baxter Gonzalez Lorie Phenix 03/16/2019, 9:42 AM

## 2019-03-16 NOTE — Progress Notes (Signed)
Pt medicated as ordered for pain with relief. Pt awoke after MN with some confusion to time and place, reoriented. Questions about when she will be getting dressed and procedures for unit Regulatory affairs officer did review all this upon first encounter with pt).  Dressing clean dry intact. Hip precautions were reviewed with pt.

## 2019-03-16 NOTE — Care Management Note (Signed)
[facility] Individual Statement of Services  Patient Name:  Tamara Gregory  Date:  03/16/2019  Welcome to the [facility].  Our goal is to provide you with an individualized program based on your diagnosis and situation, designed to meet your specific needs.  With this comprehensive rehabilitation program, you will be expected to participate in at least 3 hours of rehabilitation therapies Monday-Friday, with modified therapy programming on the weekends.  Your rehabilitation program will include the following services:  Physical Therapy (PT), Occupational Therapy (OT), 24 hour per day rehabilitation nursing, Case Management (Social Worker), Rehabilitation Medicine, Nutrition Services and Pharmacy Services  Weekly team conferences will be held on Wednesday to discuss your progress.  Your Social Worker will talk with you frequently to get your input and to update you on team discussions.  Team conferences with you and your family in attendance may also be held.  Expected length of stay: 14 days Overall anticipated outcome: supervision-min assist level  Depending on your progress and recovery, your program may change. Your Social Worker will coordinate services and will keep you informed of any changes. Your Social Worker's name and contact numbers are listed  below.  The following services may also be recommended but are not provided by the [facility]:    [facility] will be made to provide these services after discharge if needed.  Arrangements include referral to agencies that provide these services.  Your insurance has been verified to be:  [employer]-Medicare Your primary doctor is:  Billey Gosling  Pertinent information will be shared with your doctor and your insurance company.  Social Worker:  Ovidio Kin, [place] or ([phone]  Information  discussed with and copy given to patient by: Elease Hashimoto, 03/16/2019, 12:52 PM

## 2019-03-16 NOTE — Progress Notes (Signed)
Physical Therapy Session Note  Patient Details  Name: Tamara Gregory MRN: [id number] Date of Birth: [birth year]  Today's Date: 03/16/2019 PT Individual Time: 3790-2409 PT Individual Time Calculation (min): 23 min   Short Term Goals: Week 1:  PT Short Term Goal 1 (Week 1): Patient to be able to perform bed mobility with MinA  PT Short Term Goal 2 (Week 1): Patient to be able to consistently state at least 2/3 posterior hip precautions  PT Short Term Goal 3 (Week 1): Patient to be able to perform stand-pivot transfer with RW and no more than ModA  PT Short Term Goal 4 (Week 1): Patient to initiate gait training  PT Short Term Goal 5 (Week 1): Patient to be able to self-propel WC at least 60ft with B UEs and S, VC   Skilled Therapeutic Interventions/Progress Updates:  Pt rec'd in bed, c/o fatigue and that she "doesn't need all this physical therapy".  PT spent time encouraging pt and developing rapport.  Pt participated in [facility] for LT hip in pain free range, positioning in bed for comfort with mod A to scoot up in bed.  Pt left in bed with needs at hand, alarm set.  Therapy Documentation Precautions:  Precautions Precautions: Posterior Hip Precaution Comments: pt able to recall 1/3 precautions at CIR eval  Restrictions Weight Bearing Restrictions: Yes LLE Weight Bearing: Weight bearing as tolerated Pain: Pt c/o Lt hip pain with palpation, eases with rest and repositioning      Therapy/Group: Individual Therapy  Arihant Pennings 03/16/2019, 3:13 PM

## 2019-03-16 NOTE — Progress Notes (Signed)
Attempted to work with patient in the afternoon- patient in bed, easily woken and stating "Kimberlie's not home....you'd better be careful because I've already fired some of my nurses today". Provided supportive listening and motivation to participate in PT today, patient refuses even sitting at EOB to eat lunch or bed level exercises.   Deniece Ree PT, DPT, CBIS  Supplemental Physical Therapist [facility]    Pager [phone] Acute Rehab Office [phone]

## 2019-03-16 NOTE — Evaluation (Signed)
Physical Therapy Assessment and Plan  Patient Details  Name: Tamara Gregory MRN: [id number] Date of Birth: 08/19/1925  PT Diagnosis: Abnormality of gait, Coordination disorder, Difficulty walking, Muscle weakness and Pain in joint Rehab Potential: Fair ELOS: 2 weeks    Today's Date: 03/16/2019 PT Individual Time: 2458-0998 PT Individual Time Calculation (min): 81 min    Problem List:  Patient Active Problem List   Diagnosis Date Noted  . Leukocytosis   . E. coli UTI   . Hypoalbuminemia due to protein-calorie malnutrition ([place])   . Essential hypertension   . Ocular myasthenia gravis ([place])   . Acute blood loss anemia   . Preoperative cardiovascular examination   . Hip fracture ([place]) 03/10/2019  . Left displaced femoral neck fracture ([place]) 03/10/2019  . Weakness 12/04/2018  . Hypokalemia 03/18/2018  . 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 Right ovarian cyst s/p RSO 03/17/2018  . Diarrhea 12/23/2017  . Large hiatal hernia 04/01/2017  . Sweating profusely 03/19/2017  . Hyperglycemia 03/19/2017  . Squamous cell skin cancer 11/05/2016  . Nosebleed 10/19/2016  . Osteopenia 08/28/2016  . Essential hypertension, benign 08/21/2016  . Long term current use of systemic steroids 08/21/2016  . GERD (gastroesophageal reflux disease) 05/06/2016  . Mixed incontinence urge and stress 05/06/2016  . Low hemoglobin 09/22/2014  . Insomnia 07/26/2014  . Malaise and fatigue 10/04/2013  . PAC (premature atrial contraction) 10/04/2013  . Depression 02/15/2013  . Spinal stenosis, lumbar region, with neurogenic claudication 02/04/2013  . Myasthenia gravis ([place]) 03/17/2012  . Pure hypercholesterolemia 09/05/2011  . Benign hypertensive heart disease without heart failure 09/05/2011  . Osteoarthritis 09/05/2011    Past Medical History:  Past Medical History:  Diagnosis Date  . Anemia   . Anxiety   . Arthritis   . Cancer (HCC)    hx of skin cancer , colon   .  Cataract    removed both eyes   . Chronic back pain    okay with sitting, increased back pain with activity   . Depression   . GERD (gastroesophageal reflux disease)   . Glaucoma   . Headache(784.0)    hx of  . History of kidney stones   . History of migraines   . Hyperlipidemia   . Hypertension   . Macular degeneration   . Myasthenia gravis ([place])   . Occasional tremors   . Pneumonia   . Spinal stenosis   . Thoracic aneurysm    4.4cm; see CT done 01/28/12 and 01/22/13 in [facility]IC.   [name] Uses hearing aid   . Vitamin D deficiency    Past Surgical History:  Past Surgical History:  Procedure Laterality Date  . ABDOMINAL HYSTERECTOMY    . CHOLECYSTECTOMY    . COLON SURGERY     Right colectomy dr. gross 03-17-18  . HIP ARTHROPLASTY Left 03/11/2019   Procedure: ARTHROPLASTY BIPOLAR HIP (HEMIARTHROPLASTY);  Surgeon: Renette Butters, MD;  Location: [facility];  Service: Orthopedics;  Laterality: Left;  . LUMBAR LAMINECTOMY/DECOMPRESSION MICRODISCECTOMY N/A 02/04/2013   Procedure: CENTRAL DECOMPRESSION/LUMBAR LAMINECTOMY L3-L4 AND L4-L5 2 LEVELS;  Surgeon: Tobi Bastos, MD;  Location: [facility];  Service: Orthopedics;  Laterality: N/A;  . SPINAL CORD STIMULATOR IMPLANT     in right hip but not currently using because it did not help  . TONSILLECTOMY    . UPPER GASTROINTESTINAL ENDOSCOPY    . US ECHOCARDIOGRAPHY  05/18/2007   EF 55-60%    Assessment & Plan  Clinical Impression:  Tamara Gregory is a 85 year old right-handed female with history of Colon cancer with resection,Chronic back pain followed by Dr. Maryjean Ka with spinal cord stimulator, hypertension, hyperlipidemia,oocular myasthenia gravis. Per chart review patient lives with spouse. Used a wheelchair and walker prior to admission. Husband assists with some basic ADLs. One level home. Presented 03/10/2019 with fall 2 without loss of consciousness. Reports of left hip pain.CT of the hip showed acute subcapital fracture left hip mild impaction  and anterior displacement of femoral neck. Cranial CT scan was negative.underwent left hip bipolar hip hemiarthroplasty 03/11/2019 per Dr. Percell Miller. Weightbearing as tolerated with posterior hip precautions. Hospital course pain management.Acute blood loss anemia 8.7. subcutaneous Lovenox for DVT prophylaxis. Escherichia coli UTI completing a course of Keflex. Therapy evaluations completed with recommendations of physical medicine rehabilitation consult. Patient was admitted for a comprehensive rehabilitation program. Patient transferred to CIR on 03/15/2019 .   Patient currently requires max with mobility secondary to muscle weakness, decreased cardiorespiratoy endurance, decreased coordination, decreased initiation, decreased problem solving and decreased safety awareness and decreased standing balance, decreased postural control, decreased balance strategies and difficulty maintaining precautions.  Prior to hospitalization, patient was supervision with mobility and lived with Spouse in a House home.  Home access is 1Stairs to enter, Ramped entrance.  Patient will benefit from skilled PT intervention to maximize safe functional mobility, minimize fall risk and decrease caregiver burden for planned discharge home with 24 hour assist.  Anticipate patient will benefit from follow up [facility] at discharge.  PT - End of Session Activity Tolerance: Tolerates 30+ min activity with multiple rests Endurance Deficit: Yes PT Assessment Rehab Potential (ACUTE/IP ONLY): Fair PT Patient demonstrates impairments in the following area(s): Safety;Balance;Skin Integrity;Endurance;Motor;Pain PT Transfers Functional Problem(s): Bed Mobility;Bed to Chair;Car;Furniture;Floor PT Locomotion Functional Problem(s): Ambulation;Wheelchair Mobility;Stairs PT Plan PT Intensity: Minimum of 1-2 x/day ,45 to 90 minutes PT Frequency: Total of 15 hours over 7 days of combined therapies PT Duration Estimated Length of Stay: 2 weeks  PT  Treatment/Interventions: Community reintegration;Ambulation/gait training;DME/adaptive equipment instruction;Neuromuscular re-education;Psychosocial support;Stair training;UE/LE Strength taining/ROM;Wheelchair propulsion/positioning;Balance/vestibular training;Discharge planning;Functional electrical stimulation;Pain management;Skin care/wound management;Therapeutic Activities;UE/LE Coordination activities;Cognitive remediation/compensation;Disease management/prevention;Functional mobility training;Patient/family education;Splinting/orthotics;Therapeutic Exercise;Visual/perceptual remediation/compensation PT Transfers Anticipated Outcome(s): MinA, RW  PT Locomotion Anticipated Outcome(s): MinA, RW  PT Recommendation Recommendations for Other Services: Speech consult Follow Up Recommendations: Home health PT;24 hour supervision/assistance Patient destination: Home Equipment Recommended: 3 in 1 bedside comode;Tub/shower seat;Tub/shower bench Equipment Details: already has RW and WC at home   Skilled Therapeutic Intervention Patient received up in [facility], pleasantly confused and requiring reorientation repeatedly through session; transported her to PT gym totalA in [facility] for time management, able to perform sit to stand transfer with MaxA and max cues for upright posture with RW, able to maintain standing for less than a minute before needing to sit back down, then requested to use bathroom. Able to self-propel WC approximately 69f with S but then fatigued. Transported patient to bathroom in her room with totalA, performed stand-pivot transfer to toilet using grab bar in bathroom and totalA for managing clothing/brief. Spent quite a bit of time on toilet due to constipation with multiple stands with MaxA and RW/grab bar, pericare with totalA. By this time patient extremely fatigued, nursing staff brought stedy per PT request and patient required heavy maxA and multiple attempts to stand up straight enough/extend  hips enough for placement of stedy seat pads. [facility], also [facility]. ModA to return  to supine. Patient left in bed with all needs met, positioned to comfort, and bed alarm active.   PT Evaluation Precautions/Restrictions Precautions Precautions: Posterior Hip Precaution Comments: pt able to recall 1/3 precautions at CIR eval  Restrictions Weight Bearing Restrictions: Yes LLE Weight Bearing: Weight bearing as tolerated General Chart Reviewed: Yes Response to Previous Treatment: Patient reporting fatigue but able to participate. Vital SignsTherapy Vitals Temp: 98.3 F (36.8 C) Pulse Rate: (!) 56 Resp: 20 BP: (!) 153/84 Patient Position (if appropriate): Lying Oxygen Therapy SpO2: 92 % O2 Device: Room Air Pain Pain Assessment Pain Scale: Faces Faces Pain Scale: Hurts even more Pain Type: Acute pain;Surgical pain Pain Location: Hip Pain Orientation: Left Pain Descriptors / Indicators: Aching;Sharp Pain Onset: With Activity Patients Stated Pain Goal: 0 Pain Intervention(s): Repositioned;Distraction;Emotional support Multiple Pain Sites: No Home Living/Prior Functioning Home Living Living Arrangements: Spouse/significant other Available Help at Discharge: Available 24 hours/day;Family(can hire assist as needed ) Type of Home: House Home Access: Stairs to enter;Ramped entrance Entrance Stairs-Number of Steps: 1 Entrance Stairs-Rails: None Home Layout: One level Bathroom Shower/Tub: Multimedia programmer: Handicapped height Bathroom Accessibility: Yes  Lives With: Spouse Prior Function Level of Independence: Needs assistance with gait;Needs assistance with ADLs;Needs assistance with homemaking;Needs assistance with tranfers  Able to Take Stairs?: No Driving: No Vocation: Retired Biomedical scientist: reading, puzzles  Comments: spouse 38 and healthy, retired Pharmacist, community. Per patient report, spouse has been  Insurance underwriter but therapy needs to confirm this  Vision/Perception  Perception Perception: Within Functional Limits Praxis Praxis: Intact  Cognition Overall Cognitive Status: No family/caregiver present to determine baseline cognitive functioning Orientation Level: Oriented to person;Oriented to time;Oriented to situation;Disoriented to place Attention: Sustained Sustained Attention: Appears intact Sustained Attention Impairment: Verbal basic;Functional basic Memory: Impaired Memory Impairment: Decreased short term memory;Decreased recall of new information Decreased Short Term Memory: Verbal basic;Functional basic Awareness: Impaired Awareness Impairment: Emergent impairment Problem Solving: Impaired Problem Solving Impairment: Verbal basic;Functional basic Executive Function: Reasoning;Sequencing;Organizing;Decision Making;Initiating;Self Monitoring;Self Correcting Reasoning: Impaired Reasoning Impairment: Functional basic;Verbal basic Sequencing: Impaired Sequencing Impairment: Verbal basic;Functional basic Organizing: Impaired Organizing Impairment: Verbal basic;Functional basic Decision Making: Impaired Decision Making Impairment: Verbal basic;Functional basic Initiating: Impaired Initiating Impairment: Verbal basic;Functional basic Self Monitoring: Impaired Self Monitoring Impairment: Verbal basic;Functional basic Self Correcting: Impaired Self Correcting Impairment: Verbal basic;Functional basic Safety/Judgment: Impaired Sensation Sensation Light Touch: Appears Intact Hot/Cold: Appears Intact Proprioception: Appears Intact Stereognosis: Appears Intact Coordination Gross Motor Movements are Fluid and Coordinated: No Fine Motor Movements are Fluid and Coordinated: No Coordination and Movement Description: limited by gross weakness and pain  Motor  Motor Motor: Within Functional Limits Motor - Skilled Clinical Observations: general weakness and deconditioning    Mobility Bed Mobility Bed Mobility: Supine to Sit;Sit to Supine Supine to Sit: Moderate Assistance - Patient 50-74% Sit to Supine: Moderate Assistance - Patient 50-74% Transfers Transfers: Sit to Stand;Stand to Sit;Stand Pivot Transfers Sit to Stand: Maximal Assistance - Patient 25-49% Stand to Sit: Maximal Assistance - Patient 25-49% Stand Pivot Transfers: Dependent - Patient 0% Transfer (Assistive device): Other (Comment)(stedy ) Transfer via Lift Equipment: Animal nutritionist: No Gait Gait: No Stairs / Additional Locomotion Stairs: No Architect: Yes Wheelchair Assistance: Chartered loss adjuster: Both upper extremities Wheelchair Parts Management: Supervision/cueing Distance: 37f   Trunk/Postural Assessment  Cervical Assessment Cervical Assessment: Exceptions to WFL(forward head ) Thoracic Assessment Thoracic Assessment: Exceptions to WFL(kyphotic ) Lumbar Assessment Lumbar Assessment: Exceptions to WFL(rounded ) Postural Control Postural Control: Within Functional Limits  Balance Balance Balance Assessed: Yes Static Sitting Balance Static Sitting - Balance Support: Right upper extremity supported;Left upper extremity supported;Feet supported Static Sitting - Level of Assistance: 5: Stand by assistance Dynamic Sitting Balance Dynamic Sitting - Balance Support: Right upper extremity supported;Left upper extremity supported;Feet supported Dynamic Sitting - Level of Assistance: 4: Min assist Static Standing Balance Static Standing - Balance Support: Left upper extremity supported;Right upper extremity supported;During functional activity Static Standing - Level of Assistance: 4: Min assist Dynamic Standing Balance Dynamic Standing - Balance Support: Left upper extremity supported;Right upper extremity supported;During functional activity Dynamic Standing - Level of Assistance: 3: Mod  assist Extremity Assessment  RUE Assessment RUE Assessment: Exceptions to [facility] General Strength Comments: general weakness  LUE Assessment LUE Assessment: Exceptions to [facility] General Strength Comments: general weakness  RLE Assessment RLE Assessment: Exceptions to [facility] General Strength Comments: difficulty cognitively following specific cues for MMT, estimate 3 to 3+/5 based on functional performance  LLE Assessment LLE Assessment: Exceptions to [facility] General Strength Comments: difficulty cognitively following specific cues for MMT, estimate 2+ to 3/5 based on functional performance and pain     Refer to Care Plan for Long Term Goals  Recommendations for other services: Neuropsych  Discharge Criteria: Patient will be discharged from PT if patient refuses treatment 3 consecutive times without medical reason, if treatment goals not met, if there is a change in medical status, if patient makes no progress towards goals or if patient is discharged from hospital.  The above assessment, treatment plan, treatment alternatives and goals were discussed and mutually agreed upon: by patient  Deniece Ree PT, DPT, CBIS  Supplemental Physical Therapist [facility]    Pager [phone] Acute Rehab Office [phone]

## 2019-03-16 NOTE — Progress Notes (Signed)
Social Work  Social Work Assessment and Plan  Patient Details  Name: Tamara Gregory MRN: [id number] Date of Birth: 1925/03/03  Today's Date: 03/16/2019  Problem List:  Patient Active Problem List   Diagnosis Date Noted  . Leukocytosis   . E. coli UTI   . Hypoalbuminemia due to protein-calorie malnutrition ([place])   . Essential hypertension   . Ocular myasthenia gravis ([place])   . Acute blood loss anemia   . Preoperative cardiovascular examination   . Hip fracture ([place]) 03/10/2019  . Left displaced femoral neck fracture ([place]) 03/10/2019  . Weakness 12/04/2018  . Hypokalemia 03/18/2018  . 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 Right ovarian cyst s/p RSO 03/17/2018  . Diarrhea 12/23/2017  . Large hiatal hernia 04/01/2017  . Sweating profusely 03/19/2017  . Hyperglycemia 03/19/2017  . Squamous cell skin cancer 11/05/2016  . Nosebleed 10/19/2016  . Osteopenia 08/28/2016  . Essential hypertension, benign 08/21/2016  . Long term current use of systemic steroids 08/21/2016  . GERD (gastroesophageal reflux disease) 05/06/2016  . Mixed incontinence urge and stress 05/06/2016  . Low hemoglobin 09/22/2014  . Insomnia 07/26/2014  . Malaise and fatigue 10/04/2013  . PAC (premature atrial contraction) 10/04/2013  . Depression 02/15/2013  . Spinal stenosis, lumbar region, with neurogenic claudication 02/04/2013  . Myasthenia gravis ([place]) 03/17/2012  . Pure hypercholesterolemia 09/05/2011  . Benign hypertensive heart disease without heart failure 09/05/2011  . Osteoarthritis 09/05/2011   Past Medical History:  Past Medical History:  Diagnosis Date  . Anemia   . Anxiety   . Arthritis   . Cancer (HCC)    hx of skin cancer , colon   . Cataract    removed both eyes   . Chronic back pain    okay with sitting, increased back pain with activity   . Depression   . GERD (gastroesophageal reflux disease)   . Glaucoma   . Headache(784.0)    hx of  . History  of kidney stones   . History of migraines   . Hyperlipidemia   . Hypertension   . Macular degeneration   . Myasthenia gravis ([place])   . Occasional tremors   . Pneumonia   . Spinal stenosis   . Thoracic aneurysm    4.4cm; see CT done 01/28/12 and 01/22/13 in [facility].   [name] Uses hearing aid   . Vitamin D deficiency    Past Surgical History:  Past Surgical History:  Procedure Laterality Date  . ABDOMINAL HYSTERECTOMY    . CHOLECYSTECTOMY    . COLON SURGERY     Right colectomy dr. gross 03-17-18  . HIP ARTHROPLASTY Left 03/11/2019   Procedure: ARTHROPLASTY BIPOLAR HIP (HEMIARTHROPLASTY);  Surgeon: Renette Butters, MD;  Location: [facility];  Service: Orthopedics;  Laterality: Left;  . LUMBAR LAMINECTOMY/DECOMPRESSION MICRODISCECTOMY N/A 02/04/2013   Procedure: CENTRAL DECOMPRESSION/LUMBAR LAMINECTOMY L3-L4 AND L4-L5 2 LEVELS;  Surgeon: Tobi Bastos, MD;  Location: [facility];  Service: Orthopedics;  Laterality: N/A;  . SPINAL CORD STIMULATOR IMPLANT     in right hip but not currently using because it did not help  . TONSILLECTOMY    . UPPER GASTROINTESTINAL ENDOSCOPY    . US ECHOCARDIOGRAPHY  05/18/2007   EF 55-60%   Social History:  reports that she has never smoked. She has never used smokeless tobacco. She reports that she does not drink alcohol or use drugs.  Family / Support Systems Marital Status: Married How Long?: 5 years Patient  Roles: Spouse, Parent Spouse/Significant Other: Ray [phone]  [phone] Children: Dillon Bjork [place] Other Supports: Another son in [place] Anticipated Caregiver: Husband and possibly hired assist Ability/Limitations of Caregiver: Husband is 7 but very healthy and son's are out of town Building control surveyor Availability: 24/7 Family Dynamics: Close knit family pt relies upon her husband to assist, both son's are out of town but will help if needed. They have friends and church members who will visit and check on them  Social  History Preferred language: English Religion: Presbyterian Cultural Background: No issues Education: Data processing manager: Yes Write: Yes Employment Status: Retired Public relations account executive Issues: No issues Guardian/Conservator: None-according to MD pt is capable of making her own decisions while here, pt wants her husband included in decisions also   Abuse/Neglect Abuse/Neglect Assessment Can Be Completed: Yes Physical Abuse: Denies Verbal Abuse: Denies Sexual Abuse: Denies Exploitation of patient/patient's resources: Denies Self-Neglect: Denies  Emotional Status Pt's affect, behavior and adjustment status: Pt is motivated but tired today to improve. She feels she is ready to do therapies and get out of bed. She feels it will be slow but she will get there with her husband's help. He helped her prior to admission with her B & D. She was able to move on her own and she would like to get back to this. Recent Psychosocial Issues: other health issues-back issues and now this Psychiatric History: History of anxiety takes medications for this and feels they are helpful. She may benefit from seeing neuro-psych while here, will get input from team. Substance Abuse History: No issues  Patient / Family Perceptions, Expectations & Goals Pt/Family understanding of illness & functional limitations: Pt is able to explain her hip fracture and WB issues, at times she does forget her precautions. She talks with the MD and her husband has also spoken with the MD to make sure he is informed about her plan going forward. Premorbid pt/family roles/activities: Wife, Mother, grandmother, church member, friend, etc Anticipated changes in roles/activities/participation: resume Pt/family expectations/goals: Pt states: " I want to be able to move around on my own, before I leave here."  Husband states: " I will assist her and hav been before this happen."  [employer]: Other  (Comment)(had in past) Premorbid Home Care/DME Agencies: Other (Comment)(has wc, rw, tub seat and pref [facility] for [facility]) Transportation available at discharge: Husband Resource referrals recommended: Support group (specify)  Discharge Planning Living Arrangements: Spouse/significant other Support Systems: Spouse/significant other, Children, Social worker community, Friends/neighbors Type of Residence: Private residence [employer] Resources: Multimedia programmer (specify)(UHC-Medicare) Museum/gallery curator Resources: Social Security, Family Support Financial Screen Referred: No Living Expenses: Own Money Management: Spouse Does the patient have any problems obtaining your medications?: No Home Management: Hired assist to clean, husband cooks Patient/Family Preliminary Plans: Return home with husband who was assisting her prior to admission with her bathing and dressing. She was mobile prior to admission and wants to be again this is the reason she is here. Will await therapy evaluations and work on discharge needs.  Social Work Anticipated Follow Up Needs: HH/OP, Support Group  Clinical Impression Pleasant female who will need rest breaks in between her therapies to be able to give her full effort. She is willing to push herself but has premorbid back issues which may limit her. Will await therapy evaluations and work on discharge needs.  Elease Hashimoto 03/16/2019, 12:49 PM

## 2019-03-17 ENCOUNTER — Inpatient Hospital Stay (HOSPITAL_COMMUNITY): Payer: Medicare Other | Admitting: Occupational Therapy

## 2019-03-17 ENCOUNTER — Inpatient Hospital Stay (HOSPITAL_COMMUNITY): Payer: Medicare Other | Admitting: Physical Therapy

## 2019-03-17 ENCOUNTER — Inpatient Hospital Stay (HOSPITAL_COMMUNITY): Payer: Medicare Other

## 2019-03-17 DIAGNOSIS — T380X5A Adverse effect of glucocorticoids and synthetic analogues, initial encounter: Secondary | ICD-10-CM

## 2019-03-17 DIAGNOSIS — R739 Hyperglycemia, unspecified: Secondary | ICD-10-CM

## 2019-03-17 DIAGNOSIS — R0989 Other specified symptoms and signs involving the circulatory and respiratory systems: Secondary | ICD-10-CM

## 2019-03-17 LAB — GLUCOSE, CAPILLARY
Glucose-Capillary: 91 mg/dL (ref 70–99)
Glucose-Capillary: 151 mg/dL — ABNORMAL HIGH (ref 70–99)
Glucose-Capillary: 172 mg/dL — ABNORMAL HIGH (ref 70–99)

## 2019-03-17 NOTE — Progress Notes (Signed)
[facility] PHYSICAL MEDICINE & REHABILITATION PROGRESS NOTE  Subjective/Complaints: Patient seen laying in bed this morning.  She states she slept well overnight.  She states she wants to go home.  ROS: Denies CP, shortness of breath, nausea, vomiting, diarrhea.  Objective: Vital Signs: Blood pressure (!) 155/65, pulse 84, temperature 97.7 F (36.5 C), temperature source Oral, resp. rate 19, height 5\' 5"  (1.651 m), weight 67.5 kg, SpO2 93 %. Vas [place] Lower Extremity Venous (dvt)  Result Date: 03/16/2019  Lower Venous Study Indications: Swelling.  Performing Technologist: Abram Sander RVS  Examination Guidelines: A complete evaluation includes B-mode imaging, spectral Doppler, color Doppler, and power Doppler as needed of all accessible portions of each vessel. Bilateral testing is considered an integral part of a complete examination. Limited examinations for reoccurring indications may be performed as noted.  +---------+---------------+---------+-----------+----------+--------------+ RIGHT    CompressibilityPhasicitySpontaneityPropertiesSummary        +---------+---------------+---------+-----------+----------+--------------+ CFV      Full           Yes      Yes                                 +---------+---------------+---------+-----------+----------+--------------+ SFJ      Full                                                        +---------+---------------+---------+-----------+----------+--------------+ FV Prox  Full                                                        +---------+---------------+---------+-----------+----------+--------------+ FV Mid   Full                                                        +---------+---------------+---------+-----------+----------+--------------+ FV DistalFull                                                        +---------+---------------+---------+-----------+----------+--------------+ PFV      Full                                                         +---------+---------------+---------+-----------+----------+--------------+ POP      Full           Yes      Yes                                 +---------+---------------+---------+-----------+----------+--------------+ PTV      Full                                                        +---------+---------------+---------+-----------+----------+--------------+  PERO                                                  Not visualized +---------+---------------+---------+-----------+----------+--------------+   +---------+---------------+---------+-----------+----------+--------------+ LEFT     CompressibilityPhasicitySpontaneityPropertiesSummary        +---------+---------------+---------+-----------+----------+--------------+ CFV      Full           Yes      Yes                                 +---------+---------------+---------+-----------+----------+--------------+ SFJ      Full                                                        +---------+---------------+---------+-----------+----------+--------------+ FV Prox  Full                                                        +---------+---------------+---------+-----------+----------+--------------+ FV Mid   Full                                                        +---------+---------------+---------+-----------+----------+--------------+ FV DistalFull                                                        +---------+---------------+---------+-----------+----------+--------------+ PFV      Full                                                        +---------+---------------+---------+-----------+----------+--------------+ POP      Full           Yes      Yes                                 +---------+---------------+---------+-----------+----------+--------------+ PTV                                                   Not  visualized +---------+---------------+---------+-----------+----------+--------------+ PERO                                                  Not visualized +---------+---------------+---------+-----------+----------+--------------+  Summary: Right: There is no evidence of deep vein thrombosis in the lower extremity. However, portions of this examination were limited- see technologist comments above. No cystic structure found in the popliteal fossa. Left: There is no evidence of deep vein thrombosis in the lower extremity. However, portions of this examination were limited- see technologist comments above. No cystic structure found in the popliteal fossa.  *See table(s) above for measurements and observations. Electronically signed by Deitra Mayo MD on 03/16/2019 at 3:20:17 PM.    Final    Recent Labs    03/15/19 0431 03/16/19 0607  WBC 9.6 11.5*  HGB 8.7* 9.3*  HCT 28.4* 30.3*  PLT 192 211   Recent Labs    03/15/19 0431 03/16/19 0607  NA 142 139  K 4.1 3.9  CL 105 103  CO2 26 24  GLUCOSE 102* 110*  BUN 12 11  CREATININE 0.48 0.56  CALCIUM 8.9 8.7*    Physical Exam: BP (!) 155/65 (BP Location: Left Arm)   Pulse 84   Temp 97.7 F (36.5 C) (Oral)   Resp 19   Ht 5\' 5"  (1.651 m)   Wt 67.5 kg   SpO2 93%   BMI 24.76 kg/m  Constitutional: No distress . Vital signs reviewed. HENT: Normocephalic.  Atraumatic. Eyes: EOMI.  No discharge. Cardiovascular: No JVD. Respiratory: Normal effort. GI: Non-distended. Musc: No edema or tenderness in extremities. Neurological: She isalertand oriented Makes good eye contact with examiner.  Motor:  RLE 4-4+/5 prox to distal, stable.  LLE 4--4/5 HF, KE and 4+/5 ADF/PF, stable.  Skin: Left hip with dressing CDI.  Multiple scattered ecchymosis Psychiatric: Somewhat confused  Assessment/Plan: 1. Functional deficits secondary to left hip fracture status post bipolar hemiarthroplasty which require 3+ hours per day of  interdisciplinary therapy in a comprehensive inpatient rehab setting.  Physiatrist is providing close team supervision and 24 hour management of active medical problems listed below.  Physiatrist and rehab team continue to assess barriers to discharge/monitor patient progress toward functional and medical goals  Care Tool:  Bathing    Body parts bathed by patient: Right arm, Left arm, Chest, Abdomen, Face   Body parts bathed by helper: Front perineal area, Buttocks, Right upper leg, Left upper leg, Right lower leg, Left lower leg     Bathing assist Assist Level: Maximal Assistance - Patient 24 - 49%     Upper Body Dressing/Undressing Upper body dressing Upper body dressing/undressing activity did not occur (including orthotics): N/A What is the patient wearing?: Pull over shirt    Upper body assist Assist Level: Minimal Assistance - Patient > 75%    Lower Body Dressing/Undressing Lower body dressing    Lower body dressing activity did not occur: N/A What is the patient wearing?: Incontinence brief, Pants     Lower body assist Assist for lower body dressing: Total Assistance - Patient < 25%     Toileting Toileting Toileting Activity did not occur (Clothing management and hygiene only): N/A (no void or bm)  Toileting assist Assist for toileting: Moderate Assistance - Patient 50 - 74%     Transfers Chair/bed transfer  Transfers assist  Chair/bed transfer activity did not occur: N/A  Chair/bed transfer assist level: Dependent - Patient 0%(stedy )     Locomotion Ambulation   Ambulation assist   Ambulation activity did not occur: Safety/medical concerns(fatigue/balance/endurance)          Walk 10 feet activity   Assist  Walk 10 feet activity did not occur: Safety/medical concerns(fatigue/balance/endurance)  Walk 50 feet activity   Assist Walk 50 feet with 2 turns activity did not occur: Safety/medical concerns(fatigue/balance/endurance)          Walk 150 feet activity   Assist Walk 150 feet activity did not occur: Safety/medical concerns(fatigue/balance/endurance)         Walk 10 feet on uneven surface  activity   Assist Walk 10 feet on uneven surfaces activity did not occur: Safety/medical concerns(fatigue/balance/endurance)         Wheelchair     Assist Will patient use wheelchair at discharge?: Yes Type of Wheelchair: Manual    Wheelchair assist level: Supervision/Verbal cueing Max wheelchair distance: 80ft     Wheelchair 50 feet with 2 turns activity    Assist    Wheelchair 50 feet with 2 turns activity did not occur: Safety/medical concerns(fatigue/balance/endurance)       Wheelchair 150 feet activity     Assist Wheelchair 150 feet activity did not occur: Safety/medical concerns(fatigue/balance/endurance)          Medical Problem List and Plan: 1.Decreased functional mobilitysecondary to left displaced femoral neck fracture .Status post bipolar hip hemiarthroplasty.  Posterior hip precautions weightbearing as tolerated  Continue CIR  Team conference today to discuss current and goals and coordination of care, home and environmental barriers, and discharge planning with nursing, case manager, and therapies.  2. Antithrombotics: -DVT/anticoagulation:Subcutaneous Lovenox.   Vascular study limited, but negative for DVT -antiplatelet therapy: N/A 3. Pain Management/chronic back pain:Hydrocodone as needed 4. Mood:provide emotional support -antipsychotic agents: N/A 5. Neuropsych: This patientis?  Fully capable of making decisions on herown behalf. 6. Skin/Wound Care:Routine skin checks,  -local wound care to hip incision numerous abrasions/lacs in LE's 7. Fluids/Electrolytes/Nutrition:Routine in and out's  Variable p.o. intake  BMP within acceptable range on 4/14 8. Acute blood loss anemia.   Hemoglobin 9.3 on 4/14  Continue to  monitor 9. History of ocular myasthenia gravis. Continue chronic prednisone 10. Hypertension. HCTZ 12.5 mg daily  Labile on 4/15 11. Escherichia coli UTI.    Complete course of Keflex through 4/15 12. Hyperlipidemia. Crestor 13. Constipation. Laxative assistance 14.  Hypoalbuminemia  Supplement initiated on 4/14 15.  Leukocytosis  WBCs 11.5 on 4/14  Labs ordered for tomorrow  Afebrile  Continue to monitor 16.  Steroid-induced hyperglycemia  Mildly elevated on 4/15  LOS: 2 days A FACE TO FACE EVALUATION WAS PERFORMED  Michon Kaczmarek Lorie Phenix 03/17/2019, 9:41 AM

## 2019-03-17 NOTE — Progress Notes (Signed)
Social Work Patient ID: Tamara Gregory, female   DOB: [birth year], 85 y.o.   MRN: [id number] Met with pt and spoke with her husband via telephone to discuss team conference goals of supervision-min assist level and target discharge 4/27. He wants to her to stay as long as needed to get the therapies and get more mobile. Pt feels he can do everything for her. He was helping with bathing and dressing but she was moving on her own. He reports she was declining two weeks prior to admission to the hospital. She will be home for their 70th wedding anniversary which is on 4/30. Will work toward 4/28.

## 2019-03-17 NOTE — Patient Care Conference (Signed)
Inpatient RehabilitationTeam Conference and Plan of Care Update Date: 03/17/2019   Time: 2:20 PM    Patient Name: Tamara Gregory      Medical Record Number: [id number]  Date of Birth: 1925/05/29 Sex: Female         Room/Bed: 4M09C/4M09C-01 Payor Info: Payor: Theme park manager MEDICARE / Plan: UHC MEDICARE / Product Type: *No Product type* /    Admitting Diagnosis: femoral neck fracture  Admit Date/Time:  03/15/2019  5:06 PM Admission Comments: No comment available   Primary Diagnosis:  <principal problem not specified> Principal Problem: <principal problem not specified>  Patient Active Problem List   Diagnosis Date Noted  . Steroid-induced hyperglycemia   . Labile blood pressure   . Leukocytosis   . E. coli UTI   . Hypoalbuminemia due to protein-calorie malnutrition ([place])   . Essential hypertension   . Ocular myasthenia gravis ([place])   . Acute blood loss anemia   . Preoperative cardiovascular examination   . Hip fracture ([place]) 03/10/2019  . Left displaced femoral neck fracture ([place]) 03/10/2019  . Weakness 12/04/2018  . Hypokalemia 03/18/2018  . 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 Right ovarian cyst s/p RSO 03/17/2018  . Diarrhea 12/23/2017  . Large hiatal hernia 04/01/2017  . Sweating profusely 03/19/2017  . Hyperglycemia 03/19/2017  . Squamous cell skin cancer 11/05/2016  . Nosebleed 10/19/2016  . Osteopenia 08/28/2016  . Essential hypertension, benign 08/21/2016  . Long term current use of systemic steroids 08/21/2016  . GERD (gastroesophageal reflux disease) 05/06/2016  . Mixed incontinence urge and stress 05/06/2016  . Low hemoglobin 09/22/2014  . Insomnia 07/26/2014  . Malaise and fatigue 10/04/2013  . PAC (premature atrial contraction) 10/04/2013  . Depression 02/15/2013  . Spinal stenosis, lumbar region, with neurogenic claudication 02/04/2013  . Myasthenia gravis ([place]) 03/17/2012  . Pure hypercholesterolemia 09/05/2011  .  Benign hypertensive heart disease without heart failure 09/05/2011  . Osteoarthritis 09/05/2011    Expected Discharge Date: Expected Discharge Date: 03/29/19  Team Members Present: Physician leading conference: Dr. Delice Lesch Social Worker Present: Ovidio Kin, LCSW Nurse Present: Other (comment)(Woody Daisy Lazar) PT Present: Georjean Mode, PT OT Present: Willeen Cass, OT;Roanna Epley, COTA SLP Present: Weston Anna, SLP PPS Coordinator present : Gunnar Fusi     Current Status/Progress Goal Weekly Team Focus  Medical   Decreased functional mobility secondary to left displaced femoral neck fracture .Status post bipolar hip hemiarthroplasty.   Improve mobility, CBGS, WBCs, UTI, HTN  See above   Bowel/Bladder   Pt incontinent of stools c/o constipation 37ml sorbitol given pt currently having loose stools. LBM 04/15  Pt will be continent of B/B with normal bowel pattern  timed toileting    Swallow/Nutrition/ Hydration             ADL's     max-total assist   min-mod level   tolerance, pain issues and motivation  Mobility   max A transfers  min A overall  activity tolerance, decrease pain   Communication             Safety/Cognition/ Behavioral Observations            Pain   Pt has left hip pain managed with hydrocodone  pain <= 3/10   assess pain q shift   Skin   surgical incision left hip right arm foam dressing MASD to buttocks bruises right side   free from breakdown and infection resolution of current issue gerhardts to buttocks dressings  as ordered  assess skin qshift and prn      *See Care Plan and progress notes for long and short-term goals.     Barriers to Discharge  Current Status/Progress Possible Resolutions Date Resolved   Physician    Medical stability     See above  Therapies, complete abx for UTI, optimize BP meds      Nursing                  PT                    OT                  SLP                SW                Discharge  Planning/Teaching Needs:  Home with husband who was assisting with her ADL's prior to admission, will need to provide physical assist now. Will see if able to do this level of care.      Team Discussion:  Goals supervision-min assist level. Currently min-mod level of assist. Loose stools MD made aware. Checking labs white count and CBG's elevated. Confused yesterday, may ned more time to adjust to the new unit. Not sleeping well either. Made 15/7 due to too much for her, needs to space out therapies.  Revisions to Treatment Plan:  DC 4/27    Continued Need for Acute Rehabilitation Level of Care: The patient requires daily medical management by a physician with specialized training in physical medicine and rehabilitation for the following conditions: Daily direction of a multidisciplinary physical rehabilitation program to ensure safe treatment while eliciting the highest outcome that is of practical value to the patient.: Yes Daily medical management of patient stability for increased activity during participation in an intensive rehabilitation regime.: Yes Daily analysis of laboratory values and/or radiology reports with any subsequent need for medication adjustment of medical intervention for : Post surgical problems;Blood pressure problems;Urological problems   I attest that I was present, lead the team conference, and concur with the assessment and plan of the team. Teleconference held due to COVID 19   Lyndol Vanderheiden, Gardiner Rhyme 03/18/2019, 9:15 AM

## 2019-03-17 NOTE — Progress Notes (Signed)
Occupational Therapy Session Note  Patient Details  Name: Tamara Gregory MRN: [id number] Date of Birth: 30-Sep-1925  Today's Date: 03/17/2019 OT Individual Time: 9166-0600 OT Individual Time Calculation (min): 58 min    Short Term Goals: Week 1:  OT Short Term Goal 1 (Week 1): patient will complete LB bathing and dressing with long handled devices with mod A after instruction OT Short Term Goal 2 (Week 1): Patient will recall 3/3 THPs independently OT Short Term Goal 3 (Week 1): Patient will complete bed mobility with min A and min cues for safe technique OT Short Term Goal 4 (Week 1): Patient will complete SPT with RW and short distance ambulation with CG/min A and min cues for safety   Skilled Therapeutic Interventions/Progress Updates:    1:1.Pt pleasently confused, however agreeable to tx. Pt declines bathing and dressing. Pt completes stand pivot transfer with RW and MOD A for power up into standing. Pt completes grooming seated in w/c with superviison/VC for sequencing. Pt completes standing balance activity with focus on lateral weight shifting to L for WB onto hip reaching and placing clothes pins on basketball net. Pt completes ~10 sit to stand during activity, however only up for max 30seconds per bout. Exited session with pt seated in bed, lunch tray set up and call lightin reach  Therapy Documentation Precautions:  Precautions Precautions: Posterior Hip Precaution Comments: pt able to recall 1/3 precautions at CIR eval  Restrictions Weight Bearing Restrictions: Yes LLE Weight Bearing: Weight bearing as tolerated General:   Vital Signs:  Pain:   ADL: ADL Eating: Supervision/safety Where Assessed-Eating: Bed level Grooming: Minimal assistance Where Assessed-Grooming: Sitting at sink, Wheelchair Upper Body Bathing: Minimal assistance Where Assessed-Upper Body Bathing: Wheelchair, Sitting at sink Lower Body Bathing: Maximal assistance Where Assessed-Lower Body Bathing:  Bed level Upper Body Dressing: Minimal assistance Where Assessed-Upper Body Dressing: Wheelchair Lower Body Dressing: Dependent Where Assessed-Lower Body Dressing: Bed level Toileting: Other (Comment)(incontinent of urine in brief) Vision   Perception    Praxis   Exercises:   Other Treatments:     Therapy/Group: Individual Therapy  Tonny Branch 03/17/2019, 1:30 PM

## 2019-03-17 NOTE — Progress Notes (Signed)
Occupational Therapy Session Note  Patient Details  Name: Tamara Gregory MRN: [id number] Date of Birth: [birth year]  Today's Date: 03/17/2019 OT Individual Time: 1081-0653 OT Individual Time Calculation (min): 30 min    Short Term Goals: Week 1:  OT Short Term Goal 1 (Week 1): patient will complete LB bathing and dressing with long handled devices with mod A after instruction OT Short Term Goal 2 (Week 1): Patient will recall 3/3 THPs independently OT Short Term Goal 3 (Week 1): Patient will complete bed mobility with min A and min cues for safe technique OT Short Term Goal 4 (Week 1): Patient will complete SPT with RW and short distance ambulation with CG/min A and min cues for safety   Skilled Therapeutic Interventions/Progress Updates:    Pt seen this session to work on sit to stand skills to RW to improve mobility needed for toileting.  Pt was oriented today to place, situation and able to recall 2/3 THP.  With cuing, she followed directions well and she was able to rise to stand to RW with only min A.  Once standing pt wanted to sit right away, but with encouragement and a set goal of 10 sec at a time, pt able to stand 4x.  She needed cues to reach back upon sitting.  Because pt had over a 2 hour break, encouraged her to try the recliner so she could rest her head.  Attempted stand pivot with RW but pt could not shift weight and advance L foot without pt getting too nervous.  "I really think I would do better if I could just turn without the walker".  Pt completed transfer stand pivot with mod A.  Pt set up with belt alarm on and all needs met.   Therapy Documentation Precautions:  Precautions Precautions: Posterior Hip Precaution Comments: pt able to recall 1/3 precautions at CIR eval  Restrictions Weight Bearing Restrictions: Yes LLE Weight Bearing: Weight bearing as tolerated    Vital Signs: Therapy Vitals Temp: 97.7 F (36.5 C) Temp Source: Oral Pulse Rate: 84 Resp: 19 BP:  (!) 155/65 Patient Position (if appropriate): Lying Oxygen Therapy SpO2: 93 % O2 Device: Room Air Pain: Pain Assessment Pain Scale: 0-10 Pain Score: 3  Pain Type: Acute pain;Surgical pain Pain Location: Hip Pain Orientation: Left Pain Descriptors / Indicators: Aching;Discomfort Pain Frequency: Intermittent Pain Onset: On-going Patients Stated Pain Goal: 3 Pain Intervention(s): Medication (See eMAR)    Therapy/Group: Individual Therapy  [place] 03/17/2019, 8:19 AM

## 2019-03-17 NOTE — Progress Notes (Signed)
Physical Therapy Session Note  Patient Details  Name: Tamara Gregory MRN: [id number] Date of Birth: January 12, 1925  Today's Date: 03/17/2019 PT Individual Time: 5320-2334 and 1435-1535 PT Individual Time Calculation (min): 70 min and 60 min  Short Term Goals: Week 1:  PT Short Term Goal 1 (Week 1): Patient to be able to perform bed mobility with MinA  PT Short Term Goal 2 (Week 1): Patient to be able to consistently state at least 2/3 posterior hip precautions  PT Short Term Goal 3 (Week 1): Patient to be able to perform stand-pivot transfer with RW and no more than ModA  PT Short Term Goal 4 (Week 1): Patient to initiate gait training  PT Short Term Goal 5 (Week 1): Patient to be able to self-propel WC at least 42f with B UEs and S, VC   Skilled Therapeutic Interventions/Progress Updates:  Tx1: Pt presented in bed agreeable to therapy. Denies pain at rest. Pt requires increased time and rest breaks with all mobility.  PTA donned TED hose and socks total A. Pt performed ankle pumps, QS, heels slides x 10 bilaterally. Pt noted to have episode of incontinence. Performed rolling to R/L minA for peri-care and changing brief. Performed supine to sit to EOB with minA for LLE management and truncal support. PTA threaded pants total A and performed STS in Stedy modA with PTA pulling pants over hips. Pt then transferred to w/c. Pt transported to rehab gym and participated in seated therex including LAQ, hip abd/add to fatigue. Pt transported back to room at end of session and agreeable to remain in w/c with belt alarm on, call bell within reach and needs met.    Tx2:  Pt presented in bed agreeable to therapy with encouragement. Pt performed supine to sit with use of bed features and increased time with CGA. Performed stand pivot transfer to w/c with minA and poor R foot clearance due to decreased L wt bearing. Pt transported to rehab gym with remaining session focusing on STS and stand pivot transfers. Pt  performed stand pivot to mat with modA with moderate cues for anterior hip translation and erect posture. Pt performed STS from slightly elevated mat (level with w/c). Pt required verbal cues for hand placement to avoid excessive forward flexion. Pt able to perform x 3 STS modA with PTA encouraging increasing L wt bearing with pt attempting x 1 step with RLE on 3rd trial. After third trial pt becoming increasingly anxious and indicating increased HR vitals WNL. After therapeutic rest pt attmepting final stand pivot however pt unable to maintain standing for >10second. [facility] Pt required modA sit to supine from EOB and pt was able to reposition self in middle of bed. Pt left with bed alarm on, call bell within reach and needs met.      Therapy Documentation Precautions:  Precautions Precautions: Posterior Hip Precaution Comments: pt able to recall 1/3 precautions at CIR eval  Restrictions Weight Bearing Restrictions: Yes LLE Weight Bearing: Weight bearing as tolerated    Therapy/Group: Individual Therapy  Juan Kissoon  Karon Heckendorn, PTA  03/17/2019, 3:56 PM

## 2019-03-18 ENCOUNTER — Inpatient Hospital Stay (HOSPITAL_COMMUNITY): Payer: Medicare Other

## 2019-03-18 ENCOUNTER — Inpatient Hospital Stay (HOSPITAL_COMMUNITY): Payer: Medicare Other | Admitting: Occupational Therapy

## 2019-03-18 LAB — CBC
HCT: 32.3 % — ABNORMAL LOW (ref 36.0–46.0)
Hemoglobin: 10.3 g/dL — ABNORMAL LOW (ref 12.0–15.0)
MCH: 26.4 pg (ref 26.0–34.0)
MCHC: 31.9 g/dL (ref 30.0–36.0)
MCV: 82.8 fL (ref 80.0–100.0)
Platelets: 221 10*3/uL (ref 150–400)
RBC: 3.9 MIL/uL (ref 3.87–5.11)
RDW: 14.5 % (ref 11.5–15.5)
WBC: 10.2 10*3/uL (ref 4.0–10.5)
nRBC: 0 % (ref 0.0–0.2)

## 2019-03-18 LAB — GLUCOSE, CAPILLARY: Glucose-Capillary: 134 mg/dL — ABNORMAL HIGH (ref 70–99)

## 2019-03-18 NOTE — Progress Notes (Signed)
Occupational Therapy Session Note  Patient Details  Name: Tamara Gregory MRN: [id number] Date of Birth: 03-03-1925  Today's Date: 03/18/2019 OT Individual Time: 2836-6294 OT Individual Time Calculation (min): 73 min    Short Term Goals: Week 1:  OT Short Term Goal 1 (Week 1): patient will complete LB bathing and dressing with long handled devices with mod A after instruction OT Short Term Goal 2 (Week 1): Patient will recall 3/3 THPs independently OT Short Term Goal 3 (Week 1): Patient will complete bed mobility with min A and min cues for safe technique OT Short Term Goal 4 (Week 1): Patient will complete SPT with RW and short distance ambulation with CG/min A and min cues for safety   Skilled Therapeutic Interventions/Progress Updates:    Pt stated she was not having any pain but was totally exhausted and did not want to do any more therapy today.  It took quite a bit of time to get pt to agree to a bath and dressing, but she finally agreed.  Pt stated, " I am an expert at doing nothing".  With encouragement, pt actually did very well today. She bathed herself with mod A, began to learn to use a reacher to don pants over feet, improved control with sit to stand skills (at least 4x this session with min A) and was able to transfer more easily with her RW.   Pt opted to lay in bed at end of session.  Pt resting in bed with bed alarm set and all needs met.    Therapy Documentation Precautions:  Precautions Precautions: Posterior Hip Precaution Comments: pt able to recall 1/3 precautions at CIR eval  Restrictions Weight Bearing Restrictions: Yes LLE Weight Bearing: Weight bearing as tolerated   Pain: Pain Assessment Pain Scale: 0-10 Pain Score: 0-No pain  Therapy/Group: Individual Therapy  [place] 03/18/2019, 12:37 PM

## 2019-03-18 NOTE — Plan of Care (Signed)
  Problem: RH BLADDER ELIMINATION Goal: RH STG MANAGE BLADDER WITH ASSISTANCE Description STG Manage Bladder With Min  Assistance  Outcome: Not Progressing; incontinence   

## 2019-03-18 NOTE — Progress Notes (Signed)
Physical Therapy Session Note  Patient Details  Name: Tamara Gregory MRN: [id number] Date of Birth: Aug 12, 1925  Today's Date: 03/18/2019 PT Individual Time: 0830-0928 PT Individual Time Calculation (min): 58 min   Short Term Goals: Week 1:  PT Short Term Goal 1 (Week 1): Patient to be able to perform bed mobility with MinA  PT Short Term Goal 2 (Week 1): Patient to be able to consistently state at least 2/3 posterior hip precautions  PT Short Term Goal 3 (Week 1): Patient to be able to perform stand-pivot transfer with RW and no more than ModA  PT Short Term Goal 4 (Week 1): Patient to initiate gait training  PT Short Term Goal 5 (Week 1): Patient to be able to self-propel WC at least 31ft with B UEs and S, VC   Skilled Therapeutic Interventions/Progress Updates:    Patient in supine and relates she is depressed and wants to go home.  Encouraged to participate and donned TEDS with total A, then performed therex in supine as noted below.  Supine to sit with cues and min A for L LE.  Seated total A to don pants.  Patient sit to stand mod A to RW unable to achieve full upright standing.  Used Stedy for next sit to stand and pivoting to w/c, then pt requesting to toilet so transported to bathroom on [address].  Patient sit to stand to [facility] mod A and max A for hygiene, total A donning brief and pants.  Patient reported "I'm done," relates she is 85 y.o. and when she is done, she is done.  Encouraged at least to get out of bathroom and transported to recliner.  Min A to stand from stedy and sit on recliner.  Elevated legs and discussed progress, encouraged to continue to participate as pt discouraged and wanting home tomorrow (continues with confusion at times despite re-orientation).  Left in recliner with alarm belt activated and call bell/needs in reach.  Therapy Documentation Precautions:  Precautions Precautions: Posterior Hip Precaution Comments: pt able to recall 1/3 precautions at CIR eval   Restrictions Weight Bearing Restrictions: Yes LLE Weight Bearing: Weight bearing as tolerated Pain: Pain Assessment Pain Scale: 0-10 Pain Score: 0-No pain Faces Pain Scale: Hurts little more Pain Type: Acute pain Pain Location: Hip Pain Orientation: Left Pain Descriptors / Indicators: Aching;Discomfort Pain Onset: With Activity Pain Intervention(s): Elevated extremity;Repositioned Exercises: General Exercises - Lower Extremity Ankle Circles/Pumps: AROM;10 reps;Both;Supine Quad Sets: AROM;Left;10 reps;Supine Short Arc Quad: Strengthening;Left;10 reps;Supine Heel Slides: AROM;AAROM;Both;5 reps;10 reps;Supine Hip ABduction/ADduction: AAROM;Left;10 reps;Supine Other Treatments:      Therapy/Group: Individual Therapy  Reginia Naas  [place], PT 03/18/2019, 1:14 PM

## 2019-03-18 NOTE — Progress Notes (Addendum)
Patient c/o of pain rt side; pt claims she wants to go back in bed ; per pt pain subsided once  she was in bed.Continued to monitor

## 2019-03-18 NOTE — Progress Notes (Signed)
[facility] PHYSICAL MEDICINE & REHABILITATION PROGRESS NOTE  Subjective/Complaints: Patient seen laying in bed this morning.  She states she slept well overnight.  She repeats and perseverates on the fact that she wants to go home.  Long discussion with, eventually encourage patient to discuss with family and determine plan.  Patient does not recall my name.  ROS: Denies CP, shortness of breath, nausea, vomiting, diarrhea.  Objective: Vital Signs: Blood pressure (!) 156/89, pulse 93, temperature 97.7 F (36.5 C), temperature source Oral, resp. rate 18, height 5\' 5"  (1.651 m), weight 67.5 kg, SpO2 97 %. Vas [place] Lower Extremity Venous (dvt)  Result Date: 03/16/2019  Lower Venous Study Indications: Swelling.  Performing Technologist: Abram Sander RVS  Examination Guidelines: A complete evaluation includes B-mode imaging, spectral Doppler, color Doppler, and power Doppler as needed of all accessible portions of each vessel. Bilateral testing is considered an integral part of a complete examination. Limited examinations for reoccurring indications may be performed as noted.  +---------+---------------+---------+-----------+----------+--------------+ RIGHT    CompressibilityPhasicitySpontaneityPropertiesSummary        +---------+---------------+---------+-----------+----------+--------------+ CFV      Full           Yes      Yes                                 +---------+---------------+---------+-----------+----------+--------------+ SFJ      Full                                                        +---------+---------------+---------+-----------+----------+--------------+ FV Prox  Full                                                        +---------+---------------+---------+-----------+----------+--------------+ FV Mid   Full                                                        +---------+---------------+---------+-----------+----------+--------------+ FV  DistalFull                                                        +---------+---------------+---------+-----------+----------+--------------+ PFV      Full                                                        +---------+---------------+---------+-----------+----------+--------------+ POP      Full           Yes      Yes                                 +---------+---------------+---------+-----------+----------+--------------+  PTV      Full                                                        +---------+---------------+---------+-----------+----------+--------------+ PERO                                                  Not visualized +---------+---------------+---------+-----------+----------+--------------+   +---------+---------------+---------+-----------+----------+--------------+ LEFT     CompressibilityPhasicitySpontaneityPropertiesSummary        +---------+---------------+---------+-----------+----------+--------------+ CFV      Full           Yes      Yes                                 +---------+---------------+---------+-----------+----------+--------------+ SFJ      Full                                                        +---------+---------------+---------+-----------+----------+--------------+ FV Prox  Full                                                        +---------+---------------+---------+-----------+----------+--------------+ FV Mid   Full                                                        +---------+---------------+---------+-----------+----------+--------------+ FV DistalFull                                                        +---------+---------------+---------+-----------+----------+--------------+ PFV      Full                                                        +---------+---------------+---------+-----------+----------+--------------+ POP      Full           Yes      Yes                                  +---------+---------------+---------+-----------+----------+--------------+ PTV  Not visualized +---------+---------------+---------+-----------+----------+--------------+ PERO                                                  Not visualized +---------+---------------+---------+-----------+----------+--------------+     Summary: Right: There is no evidence of deep vein thrombosis in the lower extremity. However, portions of this examination were limited- see technologist comments above. No cystic structure found in the popliteal fossa. Left: There is no evidence of deep vein thrombosis in the lower extremity. However, portions of this examination were limited- see technologist comments above. No cystic structure found in the popliteal fossa.  *See table(s) above for measurements and observations. Electronically signed by Deitra Mayo MD on 03/16/2019 at 3:20:17 PM.    Final    Recent Labs    03/16/19 0607  WBC 11.5*  HGB 9.3*  HCT 30.3*  PLT 211   Recent Labs    03/16/19 0607  NA 139  K 3.9  CL 103  CO2 24  GLUCOSE 110*  BUN 11  CREATININE 0.56  CALCIUM 8.7*    Physical Exam: BP (!) 156/89 (BP Location: Left Arm)   Pulse 93   Temp 97.7 F (36.5 C) (Oral)   Resp 18   Ht 5\' 5"  (1.651 m)   Wt 67.5 kg   SpO2 97%   BMI 24.76 kg/m  Constitutional: No distress . Vital signs reviewed. HENT: Normocephalic.  Atraumatic  Eyes: EOMI.  No discharge. Cardiovascular: No JVD. Respiratory: Normal effort. GI: Non-distended. Musc: No edema or tenderness in extremities. Neurological: She isalertand oriented x2. Makes good eye contact with examiner.  Motor:  RLE 4-4+/5 prox to distal, stable.  LLE 4--4/5 HF, KE and 4+/5 ADF/PF, stable.  Skin: Left hip with dressing CDI.  Multiple scattered ecchymosis Psychiatric: Somewhat slow to process  Assessment/Plan: 1. Functional deficits secondary to  left hip fracture status post bipolar hemiarthroplasty which require 3+ hours per day of interdisciplinary therapy in a comprehensive inpatient rehab setting.  Physiatrist is providing close team supervision and 24 hour management of active medical problems listed below.  Physiatrist and rehab team continue to assess barriers to discharge/monitor patient progress toward functional and medical goals  Care Tool:  Bathing    Body parts bathed by patient: Right arm, Left arm, Chest, Abdomen, Face   Body parts bathed by helper: Front perineal area, Buttocks, Right upper leg, Left upper leg, Right lower leg, Left lower leg     Bathing assist Assist Level: Maximal Assistance - Patient 24 - 49%     Upper Body Dressing/Undressing Upper body dressing Upper body dressing/undressing activity did not occur (including orthotics): N/A What is the patient wearing?: Pull over shirt    Upper body assist Assist Level: Minimal Assistance - Patient > 75%    Lower Body Dressing/Undressing Lower body dressing    Lower body dressing activity did not occur: N/A What is the patient wearing?: Incontinence brief, Pants     Lower body assist Assist for lower body dressing: Total Assistance - Patient < 25%     Toileting Toileting Toileting Activity did not occur Landscape architect and hygiene only): N/A (no void or bm)  Toileting assist Assist for toileting: Minimal Assistance - Patient > 75%     Transfers Chair/bed transfer  Transfers assist  Chair/bed transfer activity did not occur: N/A  Chair/bed transfer assist level: Moderate Assistance -  Patient 50 - 74%     Locomotion Ambulation   Ambulation assist   Ambulation activity did not occur: Safety/medical concerns(fatigue/balance/endurance)          Walk 10 feet activity   Assist  Walk 10 feet activity did not occur: Safety/medical concerns(fatigue/balance/endurance)        Walk 50 feet activity   Assist Walk 50 feet  with 2 turns activity did not occur: Safety/medical concerns(fatigue/balance/endurance)         Walk 150 feet activity   Assist Walk 150 feet activity did not occur: Safety/medical concerns(fatigue/balance/endurance)         Walk 10 feet on uneven surface  activity   Assist Walk 10 feet on uneven surfaces activity did not occur: Safety/medical concerns(fatigue/balance/endurance)         Wheelchair     Assist Will patient use wheelchair at discharge?: Yes Type of Wheelchair: Manual    Wheelchair assist level: Supervision/Verbal cueing Max wheelchair distance: 37ft     Wheelchair 50 feet with 2 turns activity    Assist    Wheelchair 50 feet with 2 turns activity did not occur: Safety/medical concerns(fatigue/balance/endurance)       Wheelchair 150 feet activity     Assist Wheelchair 150 feet activity did not occur: Safety/medical concerns(fatigue/balance/endurance)          Medical Problem List and Plan: 1.Decreased functional mobilitysecondary to left displaced femoral neck fracture .Status post bipolar hip hemiarthroplasty.  Posterior hip precautions weightbearing as tolerated  Continue CIR, made 15/7  2. Antithrombotics: -DVT/anticoagulation:Subcutaneous Lovenox.   Vascular study limited, but negative for DVT -antiplatelet therapy: N/A 3. Pain Management/chronic back pain:Hydrocodone as needed 4. Mood:provide emotional support -antipsychotic agents: N/A 5. Neuropsych: This patientis?  Fully capable of making decisions on herown behalf. 6. Skin/Wound Care:Routine skin checks,  -local wound care to hip incision numerous abrasions/lacs in LE's 7. Fluids/Electrolytes/Nutrition:Routine in and out's  P.o. intake remains variable  BMP within acceptable range on 4/14 8. Acute blood loss anemia.   Hemoglobin 9.3 on 4/14  Continue to monitor 9. History of ocular myasthenia gravis. Continue  chronic prednisone 10. Hypertension. HCTZ 12.5 mg daily  Labile on 4/15,?  Trending up 11. Escherichia coli UTI.    Complete course of Keflex completed on 4/15 12. Hyperlipidemia. Crestor 13. Constipation. Laxative assistance 14.  Hypoalbuminemia  Supplement initiated on 4/14 15.  Leukocytosis  WBCs 11.5 on 4/14  Labs pending  Afebrile  Continue to monitor 16.  Steroid-induced hyperglycemia  Labile with variable intake on 4/16  LOS: 3 days A FACE TO FACE EVALUATION WAS PERFORMED  Ankit Lorie Phenix 03/18/2019, 9:14 AM

## 2019-03-19 ENCOUNTER — Inpatient Hospital Stay (HOSPITAL_COMMUNITY): Payer: Medicare Other | Admitting: Occupational Therapy

## 2019-03-19 ENCOUNTER — Inpatient Hospital Stay (HOSPITAL_COMMUNITY): Payer: Medicare Other | Admitting: Physical Therapy

## 2019-03-19 DIAGNOSIS — R7309 Other abnormal glucose: Secondary | ICD-10-CM

## 2019-03-19 LAB — GLUCOSE, CAPILLARY
Glucose-Capillary: 164 mg/dL — ABNORMAL HIGH (ref 70–99)
Glucose-Capillary: 158 mg/dL — ABNORMAL HIGH (ref 70–99)
Glucose-Capillary: 171 mg/dL — ABNORMAL HIGH (ref 70–99)
Glucose-Capillary: 159 mg/dL — ABNORMAL HIGH (ref 70–99)

## 2019-03-19 NOTE — Progress Notes (Signed)
Physical Therapy Session Note  Patient Details  Name: Tamara Gregory MRN: [id number] Date of Birth: 1925/04/19  Today's Date: 03/19/2019 PT Individual Time: 5732-2025 PT Individual Time Calculation (min): 27 min   Short Term Goals: Week 1:  PT Short Term Goal 1 (Week 1): Patient to be able to perform bed mobility with MinA  PT Short Term Goal 2 (Week 1): Patient to be able to consistently state at least 2/3 posterior hip precautions  PT Short Term Goal 3 (Week 1): Patient to be able to perform stand-pivot transfer with RW and no more than ModA  PT Short Term Goal 4 (Week 1): Patient to initiate gait training  PT Short Term Goal 5 (Week 1): Patient to be able to self-propel WC at least 31ft with B UEs and S, VC   Skilled Therapeutic Interventions/Progress Updates:    pt in bed, requiring max encouragement to participate in OOB activity.  Total A to don teds, min A for supine to sit with increased time and encouragement.  Max A to stand with total A to don pants, max A for stand pivot transfer with RW due to strong posterior lean and pt perseverating on fear of falling.  Pt brushes teeth and hair at sink with set up assist.  W/c mobility 50' with 2 turns with supervision with bilat UEs.  Pt left in w/c with alarm set, needs at hand.  Therapy Documentation Precautions:  Precautions Precautions: Posterior Hip Precaution Comments: pt able to recall 1/3 precautions at CIR eval  Restrictions Weight Bearing Restrictions: Yes LLE Weight Bearing: Weight bearing as tolerated General: PT Amount of Missed Time (min): 30 Minutes PT Missed Treatment Reason: Patient unwilling to participate Pain:  pt c/o pain in "big toe", repositioned and RN aware   Therapy/Group: Individual Therapy  Birney Belshe 03/19/2019, 9:21 AM

## 2019-03-19 NOTE — Progress Notes (Addendum)
Occupational Therapy Session Note  Patient Details  Name: Tamara Gregory MRN: [id number] Date of Birth: 02/25/1925  Today's Date: 03/19/2019 OT Individual Time: 1045-1100 OT Individual Time Calculation (min): 15 min (missed 60 min)   Short Term Goals: Week 1:  OT Short Term Goal 1 (Week 1): patient will complete LB bathing and dressing with long handled devices with mod A after instruction OT Short Term Goal 2 (Week 1): Patient will recall 3/3 THPs independently OT Short Term Goal 3 (Week 1): Patient will complete bed mobility with min A and min cues for safe technique OT Short Term Goal 4 (Week 1): Patient will complete SPT with RW and short distance ambulation with CG/min A and min cues for safety   Skilled Therapeutic Interventions/Progress Updates:    Pt scheduled for 75 min of therapy this session. Pt sitting in w/c dressed and ready for the day.  Pt stating she felt as though "she was about to lose her stomach" from feeling nauseus.  Brought pt an emesis bag and a ginger ale.   Sat and discussed this am.  Pt was quite confused about what she had done earlier with the PT and then later with nursing staff. Reoriented pt to events.   Pt talked about wanting to go home today just using the w/c.  Explained that she still needed to be able to stand up and transfer as her husband can not lift her.  Pt felt she could not do therapy now.  Will check back with pt at 1130 to see if we can resume session.     Returned at 1135 to see pt. She continues to refuse therapy stating, " I am too exhausted and all I want to do is sit in this chair".  Reviewed her therapy goals and then need for her to improve to go home. Pt stated, " I hope he can just hire someone to take care of me or better yet I should just go to a nursing home".   Talked with pt about some options and encouraged her to talk with her husband.  Informed her I would have SW come talk to her.  Discussed this with her SW.  Therapy  Documentation Precautions:  Precautions Precautions: Posterior Hip Precaution Comments: pt able to recall 1/3 precautions at CIR eval  Restrictions Weight Bearing Restrictions: Yes LLE Weight Bearing: Weight bearing as tolerated     Pain: Pain Assessment Pain Scale: 0-10 Pain Score: 7  Left hip - premedicated    Therapy/Group: Individual Therapy  [place] 03/19/2019, 11:08 AM

## 2019-03-19 NOTE — Progress Notes (Signed)
[facility] PHYSICAL MEDICINE & REHABILITATION PROGRESS NOTE  Subjective/Complaints: Patient seen laying in bed this morning.  She states she did not sleep well overnight because she wants to go home.  She is resting comfortably, but states that she has some [place] shortness of breath, some chest pain, some nausea.  She does not remember my name and asks again exhibits the first time.  ROS: "Some shortness of breath, some chest pain, some nausea".  Objective: Vital Signs: Blood pressure 127/79, pulse (!) 52, temperature 98.6 F (37 C), resp. rate 16, height 5\' 5"  (1.651 m), weight 67.5 kg, SpO2 96 %. No results found. Recent Labs    03/18/19 0956  WBC 10.2  HGB 10.3*  HCT 32.3*  PLT 221   No results for input(s): NA, K, CL, CO2, GLUCOSE, BUN, CREATININE, CALCIUM in the last 72 hours.  Physical Exam: BP 127/79 (BP Location: Left Arm)   Pulse (!) 52   Temp 98.6 F (37 C)   Resp 16   Ht 5\' 5"  (1.651 m)   Wt 67.5 kg   SpO2 96%   BMI 24.76 kg/m  Constitutional: No distress . Vital signs reviewed. HENT: Normocephalic.  Atraumatic Eyes: EOMI.  No discharge. Cardiovascular: No JVD. Respiratory: Normal effort. GI: Non-distended. Musc: No edema or tenderness in extremities. Neurological: She isalertand oriented x2. Makes good eye contact with examiner.  Motor:  RLE 4-4+/5 prox to distal, stable.  LLE 3+-4-/5 HF, KE and 4-/5 ADF/PF (pain inhibition).  Skin: Left hip with dressing CDI.  Multiple scattered ecchymosis Psychiatric: Somewhat slow to process  Assessment/Plan: 1. Functional deficits secondary to left hip fracture status post bipolar hemiarthroplasty which require 3+ hours per day of interdisciplinary therapy in a comprehensive inpatient rehab setting.  Physiatrist is providing close team supervision and 24 hour management of active medical problems listed below.  Physiatrist and rehab team continue to assess barriers to discharge/monitor patient progress toward  functional and medical goals  Care Tool:  Bathing    Body parts bathed by patient: Right arm, Left arm, Chest, Abdomen, Face, Right upper leg, Left upper leg   Body parts bathed by helper: Right lower leg, Left lower leg Body parts n/a: Front perineal area, Buttocks(pt cleansed by nursing previously)   Bathing assist Assist Level: Moderate Assistance - Patient 50 - 74%     Upper Body Dressing/Undressing Upper body dressing Upper body dressing/undressing activity did not occur (including orthotics): N/A What is the patient wearing?: Pull over shirt    Upper body assist Assist Level: Supervision/Verbal cueing    Lower Body Dressing/Undressing Lower body dressing    Lower body dressing activity did not occur: N/A What is the patient wearing?: Pants, Incontinence brief     Lower body assist Assist for lower body dressing: Maximal Assistance - Patient 25 - 49%     Toileting Toileting Toileting Activity did not occur (Clothing management and hygiene only): N/A (no void or bm)  Toileting assist Assist for toileting: Maximal Assistance - Patient 25 - 49%     Transfers Chair/bed transfer  Transfers assist  Chair/bed transfer activity did not occur: N/A  Chair/bed transfer assist level: Maximal Assistance - Patient 25 - 49%     Locomotion Ambulation   Ambulation assist   Ambulation activity did not occur: Safety/medical concerns(fatigue/balance/endurance)          Walk 10 feet activity   Assist  Walk 10 feet activity did not occur: Safety/medical concerns(fatigue/balance/endurance)  Walk 50 feet activity   Assist Walk 50 feet with 2 turns activity did not occur: Safety/medical concerns(fatigue/balance/endurance)         Walk 150 feet activity   Assist Walk 150 feet activity did not occur: Safety/medical concerns(fatigue/balance/endurance)         Walk 10 feet on uneven surface  activity   Assist Walk 10 feet on uneven surfaces  activity did not occur: Safety/medical concerns(fatigue/balance/endurance)         Wheelchair     Assist Will patient use wheelchair at discharge?: Yes Type of Wheelchair: Manual    Wheelchair assist level: Supervision/Verbal cueing Max wheelchair distance: 50    Wheelchair 50 feet with 2 turns activity    Assist    Wheelchair 50 feet with 2 turns activity did not occur: Safety/medical concerns(fatigue/balance/endurance)   Assist Level: Supervision/Verbal cueing   Wheelchair 150 feet activity     Assist Wheelchair 150 feet activity did not occur: Safety/medical concerns(fatigue/balance/endurance)          Medical Problem List and Plan: 1.Decreased functional mobilitysecondary to left displaced femoral neck fracture .Status post bipolar hip hemiarthroplasty on 03/11/2019.  Posterior hip precautions weightbearing as tolerated  Continue CIR, made 15/7  2. Antithrombotics: -DVT/anticoagulation:Subcutaneous Lovenox.   Vascular study limited, but negative for DVT -antiplatelet therapy: N/A 3. Pain Management/chronic back pain:Hydrocodone as needed 4. Mood:provide emotional support -antipsychotic agents: N/A 5. Neuropsych: This patientis not fully capable of making decisions on herown behalf. 6. Skin/Wound Care:Routine skin checks,  -local wound care to hip incision numerous abrasions/lacs in LE's 7. Fluids/Electrolytes/Nutrition:Routine in and out's  P.o. intake remains variable  BMP within acceptable range on 4/14 8. Acute blood loss anemia.   Hemoglobin 10.3 on 4/16  Continue to monitor 9. History of ocular myasthenia gravis. Continue chronic prednisone 10. Hypertension. HCTZ 12.5 mg daily  Remains labile on 4/17, monitor for trend 11. Escherichia coli UTI.    Complete course of Keflex completed on 4/15 12. Hyperlipidemia. Crestor 13. Constipation. Laxative assistance 14.  Hypoalbuminemia  Supplement  initiated on 4/14 15.  Leukocytosis: Resolved  WBCs 10.2 on 4/16  Afebrile  Continue to monitor 16.  Steroid-induced hyperglycemia  ?  Trending up on 4/17, will consider medications if persistently elevated  LOS: 4 days A FACE TO FACE EVALUATION WAS PERFORMED  Malajah Oceguera Lorie Phenix 03/19/2019, 9:52 AM

## 2019-03-20 ENCOUNTER — Inpatient Hospital Stay (HOSPITAL_COMMUNITY): Payer: Medicare Other | Admitting: Occupational Therapy

## 2019-03-20 LAB — GLUCOSE, CAPILLARY: Glucose-Capillary: 149 mg/dL — ABNORMAL HIGH (ref 70–99)

## 2019-03-20 NOTE — Progress Notes (Signed)
Occupational Therapy Session Note  Patient Details  Name: Tamara Gregory MRN: [id number] Date of Birth: 09/07/1925  Today's Date: 03/20/2019 OT Individual Time: 1000-1100 OT Individual Time Calculation (min): 60 min    Short Term Goals: Week 1:  OT Short Term Goal 1 (Week 1): patient will complete LB bathing and dressing with long handled devices with mod A after instruction OT Short Term Goal 2 (Week 1): Patient will recall 3/3 THPs independently OT Short Term Goal 3 (Week 1): Patient will complete bed mobility with min A and min cues for safe technique OT Short Term Goal 4 (Week 1): Patient will complete SPT with RW and short distance ambulation with CG/min A and min cues for safety   Skilled Therapeutic Interventions/Progress Updates:    "I just want to stay in bed" Informed pt this was her only therapy today and she has not bathed since Thursday so she needed to shower today.  Pt finally agreed.  Used the stedy for safety to transfer pt from bed to shower seat. Pt able to sit to stand in stedy with min A.  Once on shower seat, bathed self except for lower legs and bottom.  Therapist assisted with bottom once pt was standing again in [place].  Transferred to w/c to dress.  With LB, sit to stand to RW with mod A as therapist pulled pants over hips.   Pt stated she felt fine after shower and reinforced to pt that the activity was healthy for her.  Pt resting in w/c with belt alarm on and all needs met.   Therapy Documentation Precautions:  Precautions Precautions: Posterior Hip Precaution Comments: pt able to recall 1/3 precautions at CIR eval  Restrictions Weight Bearing Restrictions: Yes LLE Weight Bearing: Weight bearing as tolerated   Pain: Pain Assessment Pain Score: 0-No pain    Therapy/Group: Individual Therapy  Krystalyn Kubota 03/20/2019, 2:41 PM

## 2019-03-20 NOTE — Progress Notes (Signed)
[facility] PHYSICAL MEDICINE & REHABILITATION PROGRESS NOTE  Subjective/Complaints: Left leg sore. Still anxious to get home. No new complaints otherwise  ROS: Patient denies fever, rash, sore throat, blurred vision, nausea, vomiting, diarrhea, cough, shortness of breath or chest pain,  headache, or mood change.   Objective: Vital Signs: Blood pressure (!) 151/92, pulse 75, temperature 97.8 F (36.6 C), temperature source Oral, resp. rate 19, height 5\' 5"  (1.651 m), weight 67.5 kg, SpO2 92 %. No results found. Recent Labs    03/18/19 0956  WBC 10.2  HGB 10.3*  HCT 32.3*  PLT 221   No results for input(s): NA, K, CL, CO2, GLUCOSE, BUN, CREATININE, CALCIUM in the last 72 hours.  Physical Exam: BP (!) 151/92 (BP Location: Left Arm)   Pulse 75   Temp 97.8 F (36.6 C) (Oral)   Resp 19   Ht 5\' 5"  (1.651 m)   Wt 67.5 kg   SpO2 92%   BMI 24.76 kg/m  Constitutional: No distress . Vital signs reviewed. HEENT: EOMI, oral membranes moist Neck: supple Cardiovascular: RRR without murmur. No JVD    Respiratory: CTA Bilaterally without wheezes or rales. Normal effort    GI: BS +, non-tender, non-distended  Musc: No edema or tenderness in extremities. Neurological: She isalertand oriented x2. Makes good eye contact with examiner.  Motor:  RLE 4-4+/5 prox to distal, stable.  LLE 3+-4-/5 HF, KE and 4-/5 ADF/PF (pain inhibition).  Skin: left hip clean, old blood on dressing Multiple scattered ecchymosis Psychiatric: anxious  Assessment/Plan: 1. Functional deficits secondary to left hip fracture status post bipolar hemiarthroplasty which require 3+ hours per day of interdisciplinary therapy in a comprehensive inpatient rehab setting.  Physiatrist is providing close team supervision and 24 hour management of active medical problems listed below.  Physiatrist and rehab team continue to assess barriers to discharge/monitor patient progress toward functional and medical goals  Care  Tool:  Bathing    Body parts bathed by patient: Right arm, Left arm, Chest, Abdomen, Face, Right upper leg, Left upper leg   Body parts bathed by helper: Right lower leg, Left lower leg Body parts n/a: Front perineal area, Buttocks(pt cleansed by nursing previously)   Bathing assist Assist Level: Moderate Assistance - Patient 50 - 74%     Upper Body Dressing/Undressing Upper body dressing Upper body dressing/undressing activity did not occur (including orthotics): N/A What is the patient wearing?: Pull over shirt    Upper body assist Assist Level: Supervision/Verbal cueing    Lower Body Dressing/Undressing Lower body dressing    Lower body dressing activity did not occur: N/A What is the patient wearing?: Pants, Incontinence brief     Lower body assist Assist for lower body dressing: Maximal Assistance - Patient 25 - 49%     Toileting Toileting Toileting Activity did not occur (Clothing management and hygiene only): N/A (no void or bm)  Toileting assist Assist for toileting: Maximal Assistance - Patient 25 - 49%     Transfers Chair/bed transfer  Transfers assist  Chair/bed transfer activity did not occur: N/A  Chair/bed transfer assist level: Maximal Assistance - Patient 25 - 49%     Locomotion Ambulation   Ambulation assist   Ambulation activity did not occur: Safety/medical concerns(fatigue/balance/endurance)          Walk 10 feet activity   Assist  Walk 10 feet activity did not occur: Safety/medical concerns(fatigue/balance/endurance)        Walk 50 feet activity   Assist Walk 50 feet  with 2 turns activity did not occur: Safety/medical concerns(fatigue/balance/endurance)         Walk 150 feet activity   Assist Walk 150 feet activity did not occur: Safety/medical concerns(fatigue/balance/endurance)         Walk 10 feet on uneven surface  activity   Assist Walk 10 feet on uneven surfaces activity did not occur: Safety/medical  concerns(fatigue/balance/endurance)         Wheelchair     Assist Will patient use wheelchair at discharge?: Yes Type of Wheelchair: Manual    Wheelchair assist level: Supervision/Verbal cueing Max wheelchair distance: 50    Wheelchair 50 feet with 2 turns activity    Assist    Wheelchair 50 feet with 2 turns activity did not occur: Safety/medical concerns(fatigue/balance/endurance)   Assist Level: Supervision/Verbal cueing   Wheelchair 150 feet activity     Assist Wheelchair 150 feet activity did not occur: Safety/medical concerns(fatigue/balance/endurance)          Medical Problem List and Plan: 1.Decreased functional mobilitysecondary to left displaced femoral neck fracture .Status post bipolar hip hemiarthroplasty on 03/11/2019.  Posterior hip precautions weightbearing as tolerated  Continue CIR, continue 15/7  2. Antithrombotics: -DVT/anticoagulation:Subcutaneous Lovenox.   Vascular study limited, but negative for DVT -antiplatelet therapy: N/A 3. Pain Management/chronic back pain:Hydrocodone as needed 4. Mood:provide emotional support -antipsychotic agents: N/A 5. Neuropsych: This patientis not fully capable of making decisions on herown behalf. 6. Skin/Wound Care:Routine skin checks,  -local wound care to hip incision numerous abrasions/lacs in LE's 7. Fluids/Electrolytes/Nutrition:Routine in and out's  P.o. intake remains variable  BMP within acceptable range on 4/14 8. Acute blood loss anemia.   Hemoglobin 10.3 on 4/16  Continue to monitor 9. History of ocular myasthenia gravis. Continue chronic prednisone 10. Hypertension. HCTZ 12.5 mg daily  Remains labile on 4/17, monitor for trend 11. Escherichia coli UTI.    Complete course of Keflex completed on 4/15 12. Hyperlipidemia. Crestor 13. Constipation. Laxative assistance 14.  Hypoalbuminemia  Supplement initiated on 4/14 15.  Leukocytosis:  Resolved  WBCs 10.2 on 4/16  Afebrile  Continue to monitor 16.  Steroid-induced hyperglycemia    Continued elevation. Observe today before making any standing changes.   -change diet to CM  LOS: 5 days A FACE TO FACE EVALUATION WAS PERFORMED  [name] 03/20/2019, 9:20 AM

## 2019-03-21 ENCOUNTER — Inpatient Hospital Stay (HOSPITAL_COMMUNITY): Payer: Medicare Other

## 2019-03-21 ENCOUNTER — Inpatient Hospital Stay (HOSPITAL_COMMUNITY): Payer: Medicare Other | Admitting: Physical Therapy

## 2019-03-21 LAB — GLUCOSE, CAPILLARY: Glucose-Capillary: 112 mg/dL — ABNORMAL HIGH (ref 70–99)

## 2019-03-21 MED ORDER — LOSARTAN POTASSIUM 25 MG PO TABS
25.0000 mg | ORAL_TABLET | Freq: Every day | ORAL | Status: DC
  Administered 2019-03-21 – 2019-03-29 (×9): 25 mg via ORAL
  Filled 2019-03-21 (×9): qty 1

## 2019-03-21 NOTE — Progress Notes (Signed)
Physical Therapy Session Note  Patient Details  Name: Tamara Gregory MRN: [id number] Date of Birth: 11-Oct-1925  Today's Date: 03/21/2019 PT Individual Time: 1105-1200 PT Individual Time Calculation (min): 55 min   Short Term Goals: Week 1:  PT Short Term Goal 1 (Week 1): Patient to be able to perform bed mobility with MinA  PT Short Term Goal 2 (Week 1): Patient to be able to consistently state at least 2/3 posterior hip precautions  PT Short Term Goal 3 (Week 1): Patient to be able to perform stand-pivot transfer with RW and no more than ModA  PT Short Term Goal 4 (Week 1): Patient to initiate gait training  PT Short Term Goal 5 (Week 1): Patient to be able to self-propel WC at least 3ft with B UEs and S, VC   Skilled Therapeutic Interventions/Progress Updates:    Patient in w/c in room and agreeable to PT.  Patient assisted in w/c to therapy gym.  Sit to stand in parallel bars with mod A and cues.  Ambulated forward and back 5 steps mod A and cues.  Stood again with RW and mod A with max cues for hand placement.  Ambulated 5' with RW and mod A for balance, cues for sequencing.  Patient assisted in w/c to dayroom and transferred mod a with RW and increased time with cues to Nu Step.  Performed 5' level 1 with UE/LE on Nu Step.  Patient transferred back to w/c and assisted in w/c to room.  Left seated in w/c with call bell in reach and alarm belt activated.   Therapy Documentation Precautions:  Precautions Precautions: Posterior Hip Precaution Comments: pt able to recall 1/3 precautions at CIR eval  Restrictions Weight Bearing Restrictions: Yes LLE Weight Bearing: Weight bearing as tolerated Pain: Pain Assessment Faces Pain Scale: Hurts little more Pain Type: Surgical pain Pain Location: Hip Pain Orientation: Left Pain Descriptors / Indicators: Sore Pain Onset: With Activity Pain Intervention(s): Repositioned    Therapy/Group: Individual Therapy  Reginia Naas  Magda Kiel,  PT 03/21/2019, 11:47 AM

## 2019-03-21 NOTE — Progress Notes (Signed)
Occupational Therapy Session Note  Patient Details  Name: Tamara Gregory MRN: [id number] Date of Birth: May 02, 1925  Today's Date: 03/21/2019 OT Individual Time: 0700-0800 OT Individual Time Calculation (min): 60 min  and Today's Date: 03/21/2019 OT Missed Time: 15 Minutes Missed Time Reason: Patient fatigue   Short Term Goals: Week 1:  OT Short Term Goal 1 (Week 1): patient will complete LB bathing and dressing with long handled devices with mod A after instruction OT Short Term Goal 2 (Week 1): Patient will recall 3/3 THPs independently OT Short Term Goal 3 (Week 1): Patient will complete bed mobility with min A and min cues for safe technique OT Short Term Goal 4 (Week 1): Patient will complete SPT with RW and short distance ambulation with CG/min A and min cues for safety   Skilled Therapeutic Interventions/Progress Updates:    Pt resting in bed upon arrival with NT present removing breakfast tray.  Pt had eaten less than 25% and stated she doesn't eat much.  Pt initially reluctant to bet OOB but agreeable after min encouragement.  Pt required mod A for donning pants at bed level.  Pt stated she was good at putting pants on in bed because that was the way she did it at home.  Pt required mod a for bed mobility and max A for stand pivot transfer to w/c.  Pt declined grooming activities at sink.  Pt propelled w/c approx 200' before stating that she was "worn out." Pt requested to get back in bed but agreeable to remaining in w/c with belt alarm activated and her [place] on table.  RN present.  Pt missed 15 mins skilled OT services secondary to fatigue.   Therapy Documentation Precautions:  Precautions Precautions: Posterior Hip Precaution Comments: pt able to recall 1/3 precautions at CIR eval  Restrictions Weight Bearing Restrictions: Yes LLE Weight Bearing: Weight bearing as tolerated General: General OT Amount of Missed Time: 15 Minutes Pain:  Pt initially denied pain, but c/o L  shin tenderness when Ted hose donned; repositioned and activity   Therapy/Group: Individual Therapy  Leroy Libman 03/21/2019, 8:01 AM

## 2019-03-21 NOTE — Progress Notes (Signed)
[facility] PHYSICAL MEDICINE & REHABILITATION PROGRESS NOTE  Subjective/Complaints: No new issues. Pain seems under reasonable control. Anxious to get home.   ROS: Patient denies fever, rash, sore throat, blurred vision, nausea, vomiting, diarrhea, cough, shortness of breath or chest pain,   back pain, headache, or mood change.   Objective: Vital Signs: Blood pressure (!) 146/91, pulse 85, temperature 98.1 F (36.7 C), temperature source Oral, resp. rate 19, height 5\' 5"  (1.651 m), weight 67.5 kg, SpO2 96 %. No results found. Recent Labs    03/18/19 0956  WBC 10.2  HGB 10.3*  HCT 32.3*  PLT 221   No results for input(s): NA, K, CL, CO2, GLUCOSE, BUN, CREATININE, CALCIUM in the last 72 hours.  Physical Exam: BP (!) 146/91 (BP Location: Right Arm)   Pulse 85   Temp 98.1 F (36.7 C) (Oral)   Resp 19   Ht 5\' 5"  (1.651 m)   Wt 67.5 kg   SpO2 96%   BMI 24.76 kg/m  Constitutional: No distress . Vital signs reviewed. HEENT: EOMI, oral membranes moist Neck: supple Cardiovascular: RRR without murmur. No JVD    Respiratory: CTA Bilaterally without wheezes or rales. Normal effort    GI: BS +, non-tender, non-distended  Musc: No edema or tenderness in extremities. Neurological: She isalertand oriented x2. Makes good eye contact with examiner.  Motor:  RLE 4-4+/5 prox to distal, stable.  LLE 3+-4-/5 HF, KE and 4-/5 ADF/PF (ongoing pain inhib).  Skin: left hip clean, old blood on dressing Multiple scattered ecchymosis Psychiatric: pleasant  Assessment/Plan: 1. Functional deficits secondary to left hip fracture status post bipolar hemiarthroplasty which require 3+ hours per day of interdisciplinary therapy in a comprehensive inpatient rehab setting.  Physiatrist is providing close team supervision and 24 hour management of active medical problems listed below.  Physiatrist and rehab team continue to assess barriers to discharge/monitor patient progress toward functional and  medical goals  Care Tool:  Bathing    Body parts bathed by patient: Right arm, Left arm, Chest, Abdomen, Face, Right upper leg, Left upper leg, Front perineal area   Body parts bathed by helper: Buttocks, Left lower leg, Right lower leg Body parts n/a: Front perineal area, Buttocks(pt cleansed by nursing previously)   Bathing assist Assist Level: Moderate Assistance - Patient 50 - 74%     Upper Body Dressing/Undressing Upper body dressing Upper body dressing/undressing activity did not occur (including orthotics): N/A What is the patient wearing?: Pull over shirt    Upper body assist Assist Level: Supervision/Verbal cueing    Lower Body Dressing/Undressing Lower body dressing    Lower body dressing activity did not occur: N/A What is the patient wearing?: Pants, Incontinence brief     Lower body assist Assist for lower body dressing: Maximal Assistance - Patient 25 - 49%     Toileting Toileting Toileting Activity did not occur (Clothing management and hygiene only): N/A (no void or bm)  Toileting assist Assist for toileting: Maximal Assistance - Patient 25 - 49%     Transfers Chair/bed transfer  Transfers assist  Chair/bed transfer activity did not occur: N/A  Chair/bed transfer assist level: Maximal Assistance - Patient 25 - 49%     Locomotion Ambulation   Ambulation assist   Ambulation activity did not occur: Safety/medical concerns(fatigue/balance/endurance)          Walk 10 feet activity   Assist  Walk 10 feet activity did not occur: Safety/medical concerns(fatigue/balance/endurance)        Walk  50 feet activity   Assist Walk 50 feet with 2 turns activity did not occur: Safety/medical concerns(fatigue/balance/endurance)         Walk 150 feet activity   Assist Walk 150 feet activity did not occur: Safety/medical concerns(fatigue/balance/endurance)         Walk 10 feet on uneven surface  activity   Assist Walk 10 feet on uneven  surfaces activity did not occur: Safety/medical concerns(fatigue/balance/endurance)         Wheelchair     Assist Will patient use wheelchair at discharge?: Yes Type of Wheelchair: Manual    Wheelchair assist level: Supervision/Verbal cueing Max wheelchair distance: 50    Wheelchair 50 feet with 2 turns activity    Assist    Wheelchair 50 feet with 2 turns activity did not occur: Safety/medical concerns(fatigue/balance/endurance)   Assist Level: Supervision/Verbal cueing   Wheelchair 150 feet activity     Assist Wheelchair 150 feet activity did not occur: Safety/medical concerns(fatigue/balance/endurance)          Medical Problem List and Plan: 1.Decreased functional mobilitysecondary to left displaced femoral neck fracture .Status post bipolar hip hemiarthroplasty on 03/11/2019.  Posterior hip precautions weightbearing as tolerated  -Continue CIR therapies including PT, OT , continue 15/7  2. Antithrombotics: -DVT/anticoagulation:Subcutaneous Lovenox.   Vascular study limited, but negative for DVT -antiplatelet therapy: N/A 3. Pain Management/chronic back pain:Hydrocodone as needed 4. Mood:provide emotional support -antipsychotic agents: N/A 5. Neuropsych: This patientis not fully capable of making decisions on herown behalf. 6. Skin/Wound Care:Routine skin checks,  -local wound care to hip incision numerous abrasions/lacs in LE's 7. Fluids/Electrolytes/Nutrition:Routine in and out's  P.o. intake remains variable  BMP within acceptable range on 4/14 8. Acute blood loss anemia.   Hemoglobin 10.3 on 4/16  Continue to monitor 9. History of ocular myasthenia gravis. Continue chronic prednisone 10. Hypertension. HCTZ 12.5 mg daily    -BP borderline--  -resume losartan at 25mg  daily 11. Escherichia coli UTI.    Complete course of Keflex completed on 4/15 12. Hyperlipidemia. Crestor 13. Constipation.  Laxative assistance 14.  Hypoalbuminemia  Supplement initiated on 4/14 15.  Leukocytosis: Resolved  WBCs 10.2 on 4/16  Afebrile  Continue to monitor 16.  Steroid-induced hyperglycemia    Continued elevation.    -changed diet to CM--->observe  LOS: 6 days A FACE TO FACE EVALUATION WAS PERFORMED  [name] 03/21/2019, 9:35 AM

## 2019-03-21 NOTE — Progress Notes (Signed)
Occupational Therapy Session Note  Patient Details  Name: Tamara Gregory MRN: [id number] Date of Birth: 11/12/1925  Today's Date: 03/21/2019 OT Individual Time: 1330-1415 OT Individual Time Calculation (min): 45 min  and Today's Date: 03/21/2019 OT Missed Time: 15 Minutes Missed Time Reason: Patient fatigue   Short Term Goals: Week 1:  OT Short Term Goal 1 (Week 1): patient will complete LB bathing and dressing with long handled devices with mod A after instruction OT Short Term Goal 2 (Week 1): Patient will recall 3/3 THPs independently OT Short Term Goal 3 (Week 1): Patient will complete bed mobility with min A and min cues for safe technique OT Short Term Goal 4 (Week 1): Patient will complete SPT with RW and short distance ambulation with CG/min A and min cues for safety   Skilled Therapeutic Interventions/Progress Updates:    Pt sitting in w/c with lunch tray positioned on table. Pt had only eaten approx 10% and stated she didn't want any more.  Pt stated the food did not have any taste.  Encourage pt to eat more but she continued to decline.  Pt recalled 1/3 hip precautions.  Pt performed sit<>stand X 5 with RW at mod A.  Rest breaks after each sit<>stand. Pt stated she had "reached her end" and couldn't do any more.  Pt requested to return to bed and performed squat pivot transfer with mod A and max verbal cues for sequencing.  Pt required max A for sit>supine in bed but was abel to assist with repositioning in bed.  Pt remained in bed with all needs within reach and bed alarm activated.   Therapy Documentation Precautions:  Precautions Precautions: Posterior Hip Precaution Comments: pt able to recall 1/3 precautions at CIR eval  Restrictions Weight Bearing Restrictions: Yes LLE Weight Bearing: Weight bearing as tolerated General: General OT Amount of Missed Time: 15 Minutes Pain: Pain Assessment Faces Pain Scale: Hurts little more Pain Type: Surgical pain Pain Location:  Hip Pain Orientation: Left Pain Descriptors / Indicators: Sore Pain Onset: With Activity Pain Intervention(s): Repositioned   Therapy/Group: Individual Therapy  Leroy Libman 03/21/2019, 2:16 PM

## 2019-03-22 ENCOUNTER — Inpatient Hospital Stay (HOSPITAL_COMMUNITY): Payer: Medicare Other | Admitting: Physical Therapy

## 2019-03-22 ENCOUNTER — Inpatient Hospital Stay (HOSPITAL_COMMUNITY): Payer: Medicare Other | Admitting: Occupational Therapy

## 2019-03-22 MED ORDER — ACETAMINOPHEN 325 MG PO TABS
650.0000 mg | ORAL_TABLET | Freq: Four times a day (QID) | ORAL | Status: DC | PRN
  Administered 2019-03-22 – 2019-03-29 (×5): 650 mg via ORAL
  Filled 2019-03-22 (×5): qty 2

## 2019-03-22 NOTE — Progress Notes (Signed)
Occupational Therapy Session Note  Patient Details  Name: Tamara Gregory MRN: [id number] Date of Birth: 1925/04/25  Today's Date: 03/22/2019 OT Individual Time: 1497-0263 OT Individual Time Calculation (min): 40 min  and Today's Date: 03/22/2019 OT Missed Time: 20 Minutes Missed Time Reason: Patient fatigue   Short Term Goals: Week 1:  OT Short Term Goal 1 (Week 1): patient will complete LB bathing and dressing with long handled devices with mod A after instruction OT Short Term Goal 2 (Week 1): Patient will recall 3/3 THPs independently OT Short Term Goal 3 (Week 1): Patient will complete bed mobility with min A and min cues for safe technique OT Short Term Goal 4 (Week 1): Patient will complete SPT with RW and short distance ambulation with CG/min A and min cues for safety   Skilled Therapeutic Interventions/Progress Updates:    Treatment session with focus on activity tolerance and ADL retraining.  Pt received supine in bed reporting headache that was subsiding as RN had provided meds prior to session.  Pt agreeable to bathing and dressing from EOB.  While bathing, pt with c/o pain in lower back reporting "it feels like it is breaking in half".  Therapist provided support to lower back while pt completed UB dressing.  Returned to supine where pt donned pants, requiring max assist due to inability to pull pants over hips with bridging or rolling.  Pt declined further therapy at this time, reporting fatigue and needing to rest her back.  Pt left semi-reclined with all needs in reach.  Therapy Documentation Precautions:  Precautions Precautions: Posterior Hip Precaution Comments: pt able to recall 1/3 precautions at CIR eval  Restrictions Weight Bearing Restrictions: Yes LLE Weight Bearing: Weight bearing as tolerated General: General OT Amount of Missed Time: 20 Minutes Pain: Pain Assessment Pain Scale: 0-10 Pain Score: 10-Worst pain ever Pain Type: Chronic pain Pain Location:  Head Pain Orientation: Left;Right;Anterior Pain Descriptors / Indicators: Throbbing Pain Frequency: Intermittent Pain Onset: Awakened from sleep Pain Intervention(s): Medication (See eMAR)   Therapy/Group: Individual Therapy  Simonne Come 03/22/2019, 8:49 AM

## 2019-03-22 NOTE — Progress Notes (Signed)
Physical Therapy Session Note  Patient Details  Name: Tamara Gregory MRN: [id number] Date of Birth: 1925/01/17  Today's Date: 03/22/2019 PT Individual Time: 1030-1140 PT Individual Time Calculation (min): 70 min   Short Term Goals: Week 1:  PT Short Term Goal 1 (Week 1): Patient to be able to perform bed mobility with MinA  PT Short Term Goal 2 (Week 1): Patient to be able to consistently state at least 2/3 posterior hip precautions  PT Short Term Goal 3 (Week 1): Patient to be able to perform stand-pivot transfer with RW and no more than ModA  PT Short Term Goal 4 (Week 1): Patient to initiate gait training  PT Short Term Goal 5 (Week 1): Patient to be able to self-propel WC at least 9ft with B UEs and S, VC   Skilled Therapeutic Interventions/Progress Updates:    Pt received seated in bed, agreeable to PT session. No complaints of pain at rest, does have onset of headache (5/10) and L hip pain (not rated) with activity. RN able to provide pain medication for patient at end of session. Supine to sit with min A. Squat pivot transfer bed to w/c with mod A with max cueing for sequencing. Pt is setup A for ADLs at sink brushing her teeth and combing her hair. Pt requests to use the bathroom. Stand pivot transfer w/c to elevated commode seat over toilet with RW and mod A. Pt is max A for clothing management, setup A for pericare. Manual w/c propulsion x 100 ft with use of BUE and Supervision for global endurance training. Sit to stand with mod to max A to RW with focus on safe hand placement during transfer. Ambulation x 5', x 7' with RW and mod A for balance. Pt fatigues quickly with standing activity. Pt left seated in w/c in room with needs in reach, quick release belt and chair alarm in place at end of session. Pt does require encouragement for participation but demonstrates overall pleasant attitude and is agreeable to therapy tasks. Pt is able to recall 1/3 hip precautions, reviewed precautions  with patient.  Therapy Documentation Precautions:  Precautions Precautions: Posterior Hip Precaution Comments: pt able to recall 1/3 precautions at CIR eval  Restrictions Weight Bearing Restrictions: Yes LLE Weight Bearing: Weight bearing as tolerated    Therapy/Group: Individual Therapy   Excell Seltzer, PT, DPT  03/22/2019, 12:38 PM

## 2019-03-22 NOTE — Progress Notes (Signed)
[facility] PHYSICAL MEDICINE & REHABILITATION PROGRESS NOTE  Subjective/Complaints: Patient seen laying in bed this morning.  She states she believes she slept well overnight.  She believes she had a good weekend.  ROS: Denies CP, SOB, N/V/D  Objective: Vital Signs: Blood pressure (!) 110/91, pulse (!) 58, temperature 97.7 F (36.5 C), temperature source Oral, resp. rate 20, height 5\' 5"  (1.651 m), weight 67.5 kg, SpO2 95 %. No results found. No results for input(s): WBC, HGB, HCT, PLT in the last 72 hours. No results for input(s): NA, K, CL, CO2, GLUCOSE, BUN, CREATININE, CALCIUM in the last 72 hours.  Physical Exam: BP (!) 110/91 (BP Location: Left Arm)   Pulse (!) 58   Temp 97.7 F (36.5 C) (Oral)   Resp 20   Ht 5\' 5"  (1.651 m)   Wt 67.5 kg   SpO2 95%   BMI 24.76 kg/m  Constitutional: No distress . Vital signs reviewed. HENT: Normocephalic.  Atraumatic. Eyes: EOMI. No discharge. Cardiovascular: No JVD. Respiratory: Normal effort. GI: Non-distended. Musc: No edema or tenderness in extremities. Neurological: She isalertand oriented x2. Makes good eye contact with examiner.  Motor:  RLE 4-4+/5 prox to distal, unchanged.  LLE 3+-4-/5 HF, KE and 4-/5 ADF/PF (pain inhibition), unchanged.  Skin: left hip with dressing C/D/I Multiple scattered ecchymosis Psychiatric: Normal mood.  Normal affect.  Assessment/Plan: 1. Functional deficits secondary to left hip fracture status post bipolar hemiarthroplasty which require 3+ hours per day of interdisciplinary therapy in a comprehensive inpatient rehab setting.  Physiatrist is providing close team supervision and 24 hour management of active medical problems listed below.  Physiatrist and rehab team continue to assess barriers to discharge/monitor patient progress toward functional and medical goals  Care Tool:  Bathing    Body parts bathed by patient: Right arm, Left arm, Chest, Abdomen, Face, Right upper leg, Left upper  leg, Front perineal area   Body parts bathed by helper: Buttocks, Left lower leg, Right lower leg Body parts n/a: Front perineal area, Buttocks(pt cleansed by nursing previously)   Bathing assist Assist Level: Moderate Assistance - Patient 50 - 74%     Upper Body Dressing/Undressing Upper body dressing Upper body dressing/undressing activity did not occur (including orthotics): N/A What is the patient wearing?: Pull over shirt    Upper body assist Assist Level: Supervision/Verbal cueing    Lower Body Dressing/Undressing Lower body dressing    Lower body dressing activity did not occur: N/A What is the patient wearing?: Pants, Incontinence brief     Lower body assist Assist for lower body dressing: Maximal Assistance - Patient 25 - 49%     Toileting Toileting Toileting Activity did not occur (Clothing management and hygiene only): N/A (no void or bm)  Toileting assist Assist for toileting: Maximal Assistance - Patient 25 - 49%     Transfers Chair/bed transfer  Transfers assist  Chair/bed transfer activity did not occur: N/A  Chair/bed transfer assist level: Moderate Assistance - Patient 50 - 74%     Locomotion Ambulation   Ambulation assist   Ambulation activity did not occur: Safety/medical concerns(fatigue/balance/endurance)  Assist level: Moderate Assistance - Patient 50 - 74% Assistive device: Walker-rolling Max distance: 5'   Walk 10 feet activity   Assist  Walk 10 feet activity did not occur: Safety/medical concerns(fatigue/balance/endurance)        Walk 50 feet activity   Assist Walk 50 feet with 2 turns activity did not occur: Safety/medical concerns(fatigue/balance/endurance)  Walk 150 feet activity   Assist Walk 150 feet activity did not occur: Safety/medical concerns(fatigue/balance/endurance)         Walk 10 feet on uneven surface  activity   Assist Walk 10 feet on uneven surfaces activity did not occur:  Safety/medical concerns(fatigue/balance/endurance)         Wheelchair     Assist Will patient use wheelchair at discharge?: Yes Type of Wheelchair: Manual    Wheelchair assist level: Supervision/Verbal cueing Max wheelchair distance: 50    Wheelchair 50 feet with 2 turns activity    Assist    Wheelchair 50 feet with 2 turns activity did not occur: Safety/medical concerns(fatigue/balance/endurance)   Assist Level: Supervision/Verbal cueing   Wheelchair 150 feet activity     Assist Wheelchair 150 feet activity did not occur: Safety/medical concerns(fatigue/balance/endurance)          Medical Problem List and Plan: 1.Decreased functional mobilitysecondary to left displaced femoral neck fracture .Status post bipolar hip hemiarthroplasty on 03/11/2019.  Posterior hip precautions weightbearing as tolerated  -Continue CIR, continue 15/7   Weekend notes reviewed 2. Antithrombotics: -DVT/anticoagulation:Subcutaneous Lovenox.   Vascular study limited, but negative for DVT -antiplatelet therapy: N/A 3. Pain Management/chronic back pain:Hydrocodone as needed 4. Mood:provide emotional support -antipsychotic agents: N/A 5. Neuropsych: This patientis not fully capable of making decisions on herown behalf. 6. Skin/Wound Care:Routine skin checks,  -local wound care to hip incision numerous abrasions/lacs in LE's 7. Fluids/Electrolytes/Nutrition:Routine in and out's  P.o. intake remains variable on 4/20  BMP within acceptable range on 4/14 8. Acute blood loss anemia.   Hemoglobin 10.3 on 4/16  Continue to monitor 9. History of ocular myasthenia gravis. Continue chronic prednisone 10. Hypertension. HCTZ 12.5 mg daily   Resumed losartan at 25mg  daily on 4/19  Relatively controlled on 4/20 11. Escherichia coli UTI.    Complete course of Keflex completed on 4/15 12. Hyperlipidemia. Crestor 13. Constipation. Laxative  assistance 14.  Hypoalbuminemia  Supplement initiated on 4/14 15.  Leukocytosis: Resolved  WBCs 10.2 on 4/16  Afebrile  Continue to monitor 16.  Steroid-induced hyperglycemia  Changed diet to CM on 4/18  LOS: 7 days A FACE TO FACE EVALUATION WAS PERFORMED  Nihaal Friesen Lorie Phenix 03/22/2019, 9:44 AM

## 2019-03-22 NOTE — Progress Notes (Signed)
Social Work Patient ID: [name], female   DOB: 02/10/1925, 85 y.o.   MRN: [id number] Spoke with son via telephone and husband via telephone-separately to discuss pt's care and the need for 24 hr care at discharge. Made aware she doesn't always participate in therapies and feels as though this is too much for her. Discussed 24 hr care and have e-mailed private duty list to pt's son and he will talk with Dad about this. husband feels 24 hr care is not needed and he can assist once she is in bed. He and son will discuss this option and pursue caregivers. Husband feels he can do pt's care and questions if private duty caregivers are needed. Son is aware of this and voiced he is trying to convince husband it is. Have scheduled husband to come in Friday at 10-12 to go through therapies with pt to see what care she requires. Will continue to work on safe discharge for all.

## 2019-03-23 ENCOUNTER — Inpatient Hospital Stay (HOSPITAL_COMMUNITY): Payer: Medicare Other | Admitting: Occupational Therapy

## 2019-03-23 ENCOUNTER — Inpatient Hospital Stay (HOSPITAL_COMMUNITY): Payer: Medicare Other | Admitting: Physical Therapy

## 2019-03-23 NOTE — Plan of Care (Signed)
  Problem: Consults Goal: RH GENERAL PATIENT EDUCATION Description See Patient Education module for education specifics. Outcome: Progressing Goal: Skin Care Protocol Initiated - if Braden Score 18 or less Description If consults are not indicated, leave blank or document N/A Outcome: Progressing Goal: Nutrition Consult-if indicated Outcome: Progressing Goal: Diabetes Guidelines if Diabetic/Glucose > 140 Description If diabetic or lab glucose is > 140 mg/dl - Initiate Diabetes/Hyperglycemia Guidelines & Document Interventions  Outcome: Progressing   Problem: RH BOWEL ELIMINATION Goal: RH STG MANAGE BOWEL WITH ASSISTANCE Description STG Manage Bowel with Min Assistance.  Outcome: Progressing Flowsheets (Taken 03/23/2019 1206) STG: Pt will manage bowels with assistance: 4-Minimum assistance Goal: RH STG MANAGE BOWEL W/MEDICATION W/ASSISTANCE Description STG Manage Bowel with Medication with Assistance. Outcome: Progressing Flowsheets (Taken 03/23/2019 1206) STG: Pt will manage bowels with medication with assistance: 4-Minimal assistance   Problem: RH BLADDER ELIMINATION Goal: RH STG MANAGE BLADDER WITH ASSISTANCE Description STG Manage Bladder With Min Assistance  Outcome: Progressing Flowsheets (Taken 03/23/2019 1206) STG: Pt will manage bladder with assistance: 4-Minimal assistance Goal: RH STG MANAGE BLADDER WITH EQUIPMENT WITH ASSISTANCE Description STG Manage Bladder With Equipment With Min  Assistance  Outcome: Progressing Flowsheets (Taken 03/23/2019 1206) STG: Pt will manage bladder with equipment with assistance: 4-Minimal assistance   Problem: RH SKIN INTEGRITY Goal: RH STG SKIN FREE OF INFECTION/BREAKDOWN Description Patient will be free from skin breakdown during admission  Outcome: Progressing Goal: RH STG MAINTAIN SKIN INTEGRITY WITH ASSISTANCE Description STG Maintain Skin Integrity With  [place].  Outcome: Progressing Flowsheets (Taken 03/23/2019  1206) STG: Maintain skin integrity with assistance: 4-Minimal assistance Goal: RH STG ABLE TO PERFORM INCISION/WOUND CARE W/ASSISTANCE Description STG Able To Perform Incision/Wound Care With [employer].  Outcome: Progressing Flowsheets (Taken 03/23/2019 1206) STG: Pt will be able to perform incision/wound care with assistance: 4-Minimal assistance   Problem: RH SAFETY Goal: RH STG ADHERE TO SAFETY PRECAUTIONS W/ASSISTANCE/DEVICE Description STG Adhere to Safety Precautions With Min Assistance/Device.  Outcome: Progressing Flowsheets (Taken 03/23/2019 1206) STG:Pt will adhere to safety precautions with assistance/device: 4-Minimal assistance   Problem: RH PAIN MANAGEMENT Goal: RH STG PAIN MANAGED AT OR BELOW PT'S PAIN GOAL Description Patient will be pain free or pain less than 3 during admission  Outcome: Progressing   Problem: RH KNOWLEDGE DEFICIT GENERAL Goal: RH STG INCREASE KNOWLEDGE OF SELF CARE AFTER HOSPITALIZATION Outcome: Progressing

## 2019-03-23 NOTE — Progress Notes (Signed)
Physical Therapy Weekly Progress Note  Patient Details  Name: Tamara Gregory MRN: [id number] Date of Birth: [birth year]  Beginning of progress report period: March 16, 2019 End of progress report period: March 23, 2019   Patient has met 2 of 5 short term goals.  Pt making slow progress due to decreased participation, decreased motivation and impaired strength, increased pain and fear of falling.  Pt's family aware of progress and to come in for family ed this week.  Patient continues to demonstrate the following deficits muscle weakness and decreased standing balance, decreased balance strategies and difficulty maintaining precautions and therefore will continue to benefit from skilled PT intervention to increase functional independence with mobility.  Patient progressing toward long term goals..  Continue plan of care.  PT Short Term Goals Week 1:  PT Short Term Goal 1 (Week 1): Patient to be able to perform bed mobility with MinA  PT Short Term Goal 1 - Progress (Week 1): Progressing toward goal PT Short Term Goal 2 (Week 1): Patient to be able to consistently state at least 2/3 posterior hip precautions  PT Short Term Goal 2 - Progress (Week 1): Progressing toward goal PT Short Term Goal 3 (Week 1): Patient to be able to perform stand-pivot transfer with RW and no more than ModA  PT Short Term Goal 3 - Progress (Week 1): Met PT Short Term Goal 4 (Week 1): Patient to initiate gait training  PT Short Term Goal 4 - Progress (Week 1): Met PT Short Term Goal 5 (Week 1): Patient to be able to self-propel WC at least 14f with B UEs and S, VC  PT Short Term Goal 5 - Progress (Week 1): Met Week 2:  PT Short Term Goal 1 (Week 2): = LTG  Skilled Therapeutic Interventions/Progress Updates:  Community reintegration;Ambulation/gait training;DME/adaptive equipment instruction;Neuromuscular re-education;Psychosocial support;Stair training;UE/LE Strength taining/ROM;Wheelchair  propulsion/positioning;Balance/vestibular training;Discharge planning;Functional electrical stimulation;Pain management;Skin care/wound management;Therapeutic Activities;UE/LE Coordination activities;Cognitive remediation/compensation;Disease management/prevention;Functional mobility training;Patient/family education;Splinting/orthotics;Therapeutic Exercise;Visual/perceptual remediation/compensation     Anuar Walgren 03/23/2019, 8:36 AM

## 2019-03-23 NOTE — Progress Notes (Signed)
Physical Therapy Session Note  Patient Details  Name: Tamara Gregory MRN: [id number] Date of Birth: 09/08/1925  Today's Date: 03/23/2019 PT Individual Time: 1100-1153 PT Individual Time Calculation (min): 53 min   Short Term Goals: Week 2:  PT Short Term Goal 1 (Week 2): = LTG  Skilled Therapeutic Interventions/Progress Updates:  Pt rec'd in w/c, agreeable to therapy with encouragement.  W/c mobility 35', 47' with supervision with bilat UEs.  Gait with RW 5', 10' with min/mod A for gait, mod/max A for sit <> stand.  Pt then requests to use restroom.  Max A stand pivot transfer with RW to [facility] over toilet with total A for clothing management.  Sit <> stand in stedy with mod A with total A for hygiene and clothing management after toileting.  stedy transfer to bed with min A for standing from stedy, max A for LEs into bed.  Pt left in bed with needs at hand, alarm set.  Therapy Documentation Precautions:  Precautions Precautions: Posterior Hip Precaution Comments: pt able to recall 1/3 precautions at CIR eval  Restrictions Weight Bearing Restrictions: Yes LLE Weight Bearing: Weight bearing as tolerated Pain:  pt c/o hip pain with mobility, eases with rests and repositioning   Therapy/Group: Individual Therapy  Markitta Ausburn 03/23/2019, 12:11 PM

## 2019-03-23 NOTE — Progress Notes (Signed)
Occupational Therapy Weekly Progress Note  Patient Details  Name: Tamara Gregory MRN: [id number] Date of Birth: 12/19/1924  Beginning of progress report period: March 16, 2019 End of progress report period: March 23, 2019  Today's Date: 03/23/2019 OT Individual Time: 0850-1000 OT Individual Time Calculation (min): 70 min    Patient has met 2 of 4 short term goals.  Pt is making slow progress towards goals due to decreased participation, decreased motivation, and pain with mobility.  Pt has been able to verbalize 2/3 hip precautions, however continues to require mod cues to adhere to precautions during self-care tasks. Pt currently requires mod-max assist for sit > stand and stand pivot transfers.  Pt's family is aware of pt progress and is discussing hiring additional caregivers as well as plans to come in form family education this week to further evaluate ability to provide care for pt.  Patient continues to demonstrate the following deficits: muscle weakness, decreased safety awareness and decreased memory and decreased standing balance and difficulty maintaining precautions and therefore will continue to benefit from skilled OT intervention to enhance overall performance with BADL and Reduce care partner burden.  Patient progressing toward long term goals..  Continue plan of care.  OT Short Term Goals Week 1:  OT Short Term Goal 1 (Week 1): patient will complete LB bathing and dressing with long handled devices with mod A after instruction OT Short Term Goal 1 - Progress (Week 1): Met OT Short Term Goal 2 (Week 1): Patient will recall 3/3 THPs independently OT Short Term Goal 2 - Progress (Week 1): Progressing toward goal(able to recall 2/3) OT Short Term Goal 3 (Week 1): Patient will complete bed mobility with min A and min cues for safe technique OT Short Term Goal 3 - Progress (Week 1): Met OT Short Term Goal 4 (Week 1): Patient will complete SPT with RW and short distance ambulation  with CG/min A and min cues for safety  OT Short Term Goal 4 - Progress (Week 1): Progressing toward goal Week 2:  OT Short Term Goal 1 (Week 2): Patient will recall 3/3 THPs independently OT Short Term Goal 2 (Week 2): Patient will complete stand pivot transfer to toilet/wheelchair with RW with min assist  Skilled Therapeutic Interventions/Progress Updates:    Treatment session with focus on ADL retraining and functional transfers.  Pt received in bed reporting extremely fatigued.  With increased time and encouragement, pt willing to participate in dressing and grooming tasks.  Discussed current goals, pt reports family is discussing hired caregivers for home and she is in agreement with this.  Pt completed bed mobility with min cues and min assist when lifting torso from bed.  Max assist sit > stand from EOB and then mod assist stand pivot with RW with max cues for sequencing and therapist assisting with management of RW during transfer.  Pt completed LB dressing with mod cues for sequencing and education on use of AE, pt able to thread BLE without use of AE while maintaining hip precautions.  Engaged in grooming tasks at sink with cues for initiation and sequencing.  Pt able to verbalize 2/3 hip precautions and when provided with 3rd pt able to recall 5 mins later.  Pt remained upright in w/c with seat belt alarm on and all needs in reach.  Therapy Documentation Precautions:  Precautions Precautions: Posterior Hip Precaution Comments: pt able to recall 1/3 precautions at CIR eval  Restrictions Weight Bearing Restrictions: Yes LLE Weight Bearing: Weight bearing  as tolerated General:   Vital Signs: Therapy Vitals Temp: 98.1 F (36.7 C) Temp Source: Oral Pulse Rate: 76 Resp: 18 BP: (!) 142/75 Patient Position (if appropriate): Lying Oxygen Therapy SpO2: 92 % O2 Device: Room Air Pain:  Pt with no c/o pain   Therapy/Group: Individual Therapy  Simonne Come 03/23/2019, 7:28 AM

## 2019-03-23 NOTE — Progress Notes (Signed)
Tamara Gregory PHYSICAL MEDICINE & REHABILITATION PROGRESS NOTE  Subjective/Complaints: Patient seen laying in bed this morning.  He states she did not sleep well overnight because "I have been left here alone while my husband plays golf and does not even come to feed me".  ROS: Denies CP, SOB, N/V/D  Objective: Vital Signs: Blood pressure (!) 142/75, pulse 76, temperature 98.1 F (36.7 C), temperature source Oral, resp. rate 18, height 5\' 5"  (1.651 m), weight 67.5 kg, SpO2 92 %. No results found. No results for input(s): WBC, HGB, HCT, PLT in the last 72 hours. No results for input(s): NA, K, CL, CO2, GLUCOSE, BUN, CREATININE, CALCIUM in the last 72 hours.  Physical Exam: BP (!) 142/75 (BP Location: Right Arm)   Pulse 76   Temp 98.1 F (36.7 C) (Oral)   Resp 18   Ht 5\' 5"  (1.651 m)   Wt 67.5 kg   SpO2 92%   BMI 24.76 kg/m  Constitutional: No distress . Vital signs reviewed. HENT: Normocephalic.  Atraumatic. Eyes: EOMI.  No discharge. Cardiovascular: No JVD. Respiratory: Normal effort. GI: Non-distended. Musc: No edema or tenderness in extremities. Neurological: She isalertand oriented x2. Makes good eye contact with examiner.  Motor:  RLE 4-4+/5 prox to distal, stable.  LLE 3+-4-/5 HF, KE and 4-/5 ADF/PF (pain inhibition), unchanged.  Skin: left hip with incision C/D/I Multiple scattered ecchymosis Psychiatric: Normal mood.  Normal affect.  Assessment/Plan: 1. Functional deficits secondary to left hip fracture status post bipolar hemiarthroplasty which require 3+ hours per day of interdisciplinary therapy in a comprehensive inpatient rehab setting.  Physiatrist is providing close team supervision and 24 hour management of active medical problems listed below.  Physiatrist and rehab team continue to assess barriers to discharge/monitor patient progress toward functional and medical goals  Care Tool:  Bathing    Body parts bathed by patient: Right arm, Left arm,  Chest, Abdomen, Face, Right upper leg, Left upper leg, Front perineal area   Body parts bathed by helper: Buttocks, Left lower leg, Right lower leg Body parts n/a: Front perineal area, Buttocks(pt cleansed by nursing previously)   Bathing assist Assist Level: Moderate Assistance - Patient 50 - 74%     Upper Body Dressing/Undressing Upper body dressing Upper body dressing/undressing activity did not occur (including orthotics): N/A What is the patient wearing?: Pull over shirt    Upper body assist Assist Level: Supervision/Verbal cueing    Lower Body Dressing/Undressing Lower body dressing    Lower body dressing activity did not occur: N/A What is the patient wearing?: Pants, Incontinence brief     Lower body assist Assist for lower body dressing: Maximal Assistance - Patient 25 - 49%     Toileting Toileting Toileting Activity did not occur (Clothing management and hygiene only): N/A (no void or bm)  Toileting assist Assist for toileting: Maximal Assistance - Patient 25 - 49%     Transfers Chair/bed transfer  Transfers assist  Chair/bed transfer activity did not occur: N/A  Chair/bed transfer assist level: Moderate Assistance - Patient 50 - 74%     Locomotion Ambulation   Ambulation assist   Ambulation activity did not occur: Safety/medical concerns(fatigue/balance/endurance)  Assist level: Moderate Assistance - Patient 50 - 74% Assistive device: Walker-rolling Max distance: 7'   Walk 10 feet activity   Assist  Walk 10 feet activity did not occur: Safety/medical concerns(fatigue/balance/endurance)        Walk 50 feet activity   Assist Walk 50 feet with 2 turns activity did  not occur: Safety/medical concerns(fatigue/balance/endurance)         Walk 150 feet activity   Assist Walk 150 feet activity did not occur: Safety/medical concerns(fatigue/balance/endurance)         Walk 10 feet on uneven surface  activity   Assist Walk 10 feet on  uneven surfaces activity did not occur: Safety/medical concerns(fatigue/balance/endurance)         Wheelchair     Assist Will patient use wheelchair at discharge?: Yes Type of Wheelchair: Manual    Wheelchair assist level: Supervision/Verbal cueing Max wheelchair distance: 100'    Wheelchair 50 feet with 2 turns activity    Assist    Wheelchair 50 feet with 2 turns activity did not occur: Safety/medical concerns(fatigue/balance/endurance)   Assist Level: Supervision/Verbal cueing   Wheelchair 150 feet activity     Assist Wheelchair 150 feet activity did not occur: Safety/medical concerns(fatigue/balance/endurance)          Medical Problem List and Plan: 1.Decreased functional mobilitysecondary to left displaced femoral neck fracture .Status post bipolar hip hemiarthroplasty on 03/11/2019.  Posterior hip precautions weightbearing as tolerated  -Continue CIR, continue 15/7  2. Antithrombotics: -DVT/anticoagulation:Subcutaneous Lovenox.   Vascular study limited, but negative for DVT -antiplatelet therapy: N/A 3. Pain Management/chronic back pain:Hydrocodone as needed 4. Mood:provide emotional support -antipsychotic agents: N/A 5. Neuropsych: This patientis not fully capable of making decisions on herown behalf. 6. Skin/Wound Care:Routine skin checks,  -local wound care to hip incision numerous abrasions/lacs in LE's 7. Fluids/Electrolytes/Nutrition:Routine in and out's  P.o. intake remains variable on 4/21  BMP within acceptable range on 4/14, labs ordered for tomorrow 8. Acute blood loss anemia.   Hemoglobin 10.3 on 4/16  Continue to monitor 9. History of ocular myasthenia gravis. Continue chronic prednisone 10. Hypertension. HCTZ 12.5 mg daily   Resumed losartan at 25mg  daily on 4/19  Labile on 4/21 11. Escherichia coli UTI.    Complete course of Keflex completed on 4/15 12. Hyperlipidemia.  Crestor 13. Constipation. Laxative assistance 14.  Hypoalbuminemia  Supplement initiated on 4/14 15.  Leukocytosis: Resolved  WBCs 10.2 on 4/16  Afebrile  Continue to monitor 16.  Steroid-induced hyperglycemia  Changed diet to CM on 4/18  LOS: 8 days A FACE TO FACE EVALUATION WAS PERFORMED  Ankit Lorie Phenix 03/23/2019, 9:17 AM

## 2019-03-24 ENCOUNTER — Inpatient Hospital Stay (HOSPITAL_COMMUNITY): Payer: Medicare Other

## 2019-03-24 ENCOUNTER — Inpatient Hospital Stay (HOSPITAL_COMMUNITY): Payer: Medicare Other | Admitting: Occupational Therapy

## 2019-03-24 DIAGNOSIS — R000 Tachycardia, unspecified: Secondary | ICD-10-CM

## 2019-03-24 DIAGNOSIS — E876 Hypokalemia: Secondary | ICD-10-CM

## 2019-03-24 LAB — BASIC METABOLIC PANEL
Anion gap: 11 (ref 5–15)
BUN: 18 mg/dL (ref 8–23)
CO2: 26 mmol/L (ref 22–32)
Calcium: 9.2 mg/dL (ref 8.9–10.3)
Chloride: 102 mmol/L (ref 98–111)
Creatinine, Ser: 0.64 mg/dL (ref 0.44–1.00)
GFR calc Af Amer: >60 mL/min (ref >60)
GFR calc non Af Amer: >60 mL/min (ref >60)
Glucose, Bld: 107 mg/dL — ABNORMAL HIGH (ref 70–99)
Potassium: 3 mmol/L — ABNORMAL LOW (ref 3.5–5.1)
Sodium: 139 mmol/L (ref 135–145)

## 2019-03-24 MED ORDER — POTASSIUM CHLORIDE CRYS ER 20 MEQ PO TBCR
30.0000 meq | EXTENDED_RELEASE_TABLET | Freq: Two times a day (BID) | ORAL | Status: AC
  Administered 2019-03-24 – 2019-03-25 (×4): 30 meq via ORAL
  Filled 2019-03-24 (×4): qty 1

## 2019-03-24 NOTE — Progress Notes (Signed)
Occupational Therapy Session Note  Patient Details  Name: Tamara Gregory MRN: [id number] Date of Birth: 06-05-1925  Today's Date: 03/24/2019 OT Individual Time: 4098-1191 OT Individual Time Calculation (min): 55 min    Short Term Goals: Week 2:  OT Short Term Goal 1 (Week 2): Patient will recall 3/3 THPs independently OT Short Term Goal 2 (Week 2): Patient will complete stand pivot transfer to toilet/wheelchair with RW with min assist  Skilled Therapeutic Interventions/Progress Updates:    Treatment session with focus on ADL retraining, primary focus on bathroom transfers.  Pt declined any bathing/dressing this session.  Engaged in prolonged discussion regarding goals and progress towards goals.  Pt expressing concern that her husband will not be able to care for her at the level that she currently is, attempted to encourage pt by discussing progress towards goals.  Pt adamant that she should have gone home instead of coming to CIR as she is not making enough progress.  Engaged in stand pivot transfers to/from toilet with RW.  Pt reports w/c will fit in her bathrooms and that she has one bathroom with elevated commode and has access to a BSC to place over toilet.  Pt required mod cues for hand placement and sequencing of sit > stand, then able to complete stand with mod (light lifting) assistance.  Pt completed stand pivot transfer with min assist with increased time and cues for sequencing with RW.  Engaged in sit > stand x3 from [facility] as pt expressing desire to do more ("Ray can't do that").  Min assist from elevated BSC and min assist stand pivot transfer back to w/c.  Pt then stated "I am done".  Engaged in prolonged discussion regarding current level and need to progress prior to d/c home as pt voicing concerns about husband ability to provide level of care.  Notified RN of pt concerns.  Therapy Documentation Precautions:  Precautions Precautions: Posterior Hip Precaution Comments: pt able to  recall 1/3 precautions at CIR eval  Restrictions Weight Bearing Restrictions: Yes LLE Weight Bearing: Weight bearing as tolerated General:   Vital Signs: Therapy Vitals Pulse Rate: 86 Patient Position (if appropriate): Sitting Pain: Pain Assessment Pain Scale: 0-10 Pain Score: 0-No pain   Therapy/Group: Individual Therapy  Simonne Come 03/24/2019, 11:50 AM

## 2019-03-24 NOTE — Patient Care Conference (Signed)
Inpatient RehabilitationTeam Conference and Plan of Care Update Date: 03/24/2019   Time: 2:20 PM    Patient Name: Tamara Gregory      Medical Record Number: [id number]  Date of Birth: October 24, 1925 Sex: Female         Room/Bed: 4M09C/4M09C-01 Payor Info: Payor: [employer] MEDICARE / Plan: [employer] MEDICARE / Product Type: *No Product type* /    Admitting Diagnosis: femoral neck fracture  Admit Date/Time:  03/15/2019  5:06 PM Admission Comments: No comment available   Primary Diagnosis:  <principal problem not specified> Principal Problem: <principal problem not specified>  Patient Active Problem List   Diagnosis Date Noted  . Tachycardia   . Labile blood glucose   . Steroid-induced hyperglycemia   . Labile blood pressure   . Leukocytosis   . E. coli UTI   . Hypoalbuminemia due to protein-calorie malnutrition ([place])   . Essential hypertension   . Ocular myasthenia gravis ([place])   . Acute blood loss anemia   . Preoperative cardiovascular examination   . Hip fracture ([place]) 03/10/2019  . Left displaced femoral neck fracture ([place]) 03/10/2019  . Weakness 12/04/2018  . Hypokalemia 03/18/2018  . 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 Right ovarian cyst s/p RSO 03/17/2018  . Diarrhea 12/23/2017  . Large hiatal hernia 04/01/2017  . Sweating profusely 03/19/2017  . Hyperglycemia 03/19/2017  . Squamous cell skin cancer 11/05/2016  . Nosebleed 10/19/2016  . Osteopenia 08/28/2016  . Essential hypertension, benign 08/21/2016  . Long term current use of systemic steroids 08/21/2016  . GERD (gastroesophageal reflux disease) 05/06/2016  . Mixed incontinence urge and stress 05/06/2016  . Low hemoglobin 09/22/2014  . Insomnia 07/26/2014  . Malaise and fatigue 10/04/2013  . PAC (premature atrial contraction) 10/04/2013  . Depression 02/15/2013  . Spinal stenosis, lumbar region, with neurogenic claudication 02/04/2013  . Myasthenia gravis ([place]) 03/17/2012  .  Pure hypercholesterolemia 09/05/2011  . Benign hypertensive heart disease without heart failure 09/05/2011  . Osteoarthritis 09/05/2011    Expected Discharge Date: Expected Discharge Date: 03/29/19  Team Members Present: Physician leading conference: Dr. Delice Lesch Social Worker Present: Ovidio Kin, LCSW Nurse Present: Blair Heys, RN PT Present: Georjean Mode, PT OT Present: Willeen Cass, OT;Roanna Epley, COTA SLP Present: Weston Anna, SLP PPS Coordinator present : Gunnar Fusi     Current Status/Progress Goal Weekly Team Focus  Medical   Decreased functional mobility secondary to left displaced femoral neck fracture .Status post bipolar hip hemiarthroplasty on 03/11/2019.  Improve mobility, BP, hypokalemia  See above   Bowel/Bladder   incontinent of bowel & bladder, LBM 03/22/19  less episodes of incontinence  timed toileting   Swallow/Nutrition/ Hydration             ADL's   mod assist stand pivot transfers with RW, mod assist bathing, min assist donning pants (husband dons footwear), Setup UB dressing  min assist transfers, min assist LB dressing, bathing, and dynamic standing balance, Setup UB dressing and grooming  ADL retraining, functional transfers, LB bathing/dressing, activity tolerance, family education, d/c planning   Mobility   mod/max A transfers, gait 5' with mod A with RW. continues with decreased participation in therapy  min A overall  family ed, d/c planning, activity tolerance   Communication             Safety/Cognition/ Behavioral Observations            Pain   left hip pain, has tylenol & norco  prn  pain scale <4/10  assess & treat as needed   Skin   incision to left hip, MASD to groin, perineum & buttocks  no new skin breakdown   assess q shift      *See Care Plan and progress notes for long and short-term goals.     Barriers to Discharge  Current Status/Progress Possible Resolutions Date Resolved   Physician    Medical stability     See  above  Therapies, optimize BP meds, follow labs, supplement K+      Nursing                  PT                    OT                  SLP                SW                Discharge Planning/Teaching Needs:  Trying to convince husband to hire assist for pt at [place]. Son is also working on this. Husband is elderly himself and can not provide much phyiscal assist. Have scheduled family educaiton for Friday @ 10-12      Team Discussion:  Progressing slowly in therapies, limited due to her lack of participation. Feels it is too much for her. Started on BP home meds. Supplementing her potassium. Min-mod assist level. EKG done for HR issues, fine. Family education Friday with husband and son. Aware she will need 24 hr physical assist. Continent during the day not at night.  Revisions to Treatment Plan:  DC 4/27    Continued Need for Acute Rehabilitation Level of Care: The patient requires daily medical management by a physician with specialized training in physical medicine and rehabilitation for the following conditions: Daily direction of a multidisciplinary physical rehabilitation program to ensure safe treatment while eliciting the highest outcome that is of practical value to the patient.: Yes Daily medical management of patient stability for increased activity during participation in an intensive rehabilitation regime.: Yes Daily analysis of laboratory values and/or radiology reports with any subsequent need for medication adjustment of medical intervention for : Post surgical problems;Blood pressure problems;Other   I attest that I was present, lead the team conference, and concur with the assessment and plan of the team. Teleconference held due to COVID-19   Elease Hashimoto 03/24/2019, 3:54 PM

## 2019-03-24 NOTE — Progress Notes (Signed)
Physical Therapy Session Note  Patient Details  Name: Tamara Gregory MRN: [id number] Date of Birth: Jul 05, 1925  Today's Date: 03/24/2019 PT Individual Time: 0800-0845 PT Individual Time Calculation (min): 45 min   Short Term Goals: Week 2:  PT Short Term Goal 1 (Week 2): = LTG  Skilled Therapeutic Interventions/Progress Updates:     Patient in bed with NT performing patient care upon PT arrival. Patient alert and agreeable to PT session. B thigh high TED hose and yellow non-skid socks donned by PT at beginning of session. Patient was able to state 3/3 posterior hip precautions at beginning of session with 1 cue from PT.  Therapeutic Activity: Bed Mobility: Patient performed supine to sit with min A for LE and trunk support with HOB flat. Provided verbal cues for rolling to her L side and pushing up with B UEs to sit up. Transfers: Patient performed sit to/from stand from the bed x2 trials with max A using RW, failing to come to a full stand and insisting she needed to sit down. Second attempt was with the bed elevated. Patient reported she was scared and didn't want stand with something that moves. PT comforted and reassured the patient that she would not fall and encouraged patient to try again with out success. Patient then performed sit to stand with the Stedy from the bed x1 and from the seat of the Stedy x5 with min A. Encoraged patient to attempt to progress to the RW during her next session. Provided verbal cues for hand placement and pushing through her legs.  Therapeutic Exercise: Patient performed the following exercises with verbal and tactile cues for proper technique. -B ankle pumps x10 -B heel slides x10 -B SLR with AAROM x10 -B glut sets x10 with 5 second holds  Patient in w/c at end of session with breaks locked, seat belt alarm set, and all needs within reach.    Therapy Documentation Precautions:  Precautions Precautions: Posterior Hip Precaution Comments: pt able  to recall 1/3 precautions at CIR eval  Restrictions Weight Bearing Restrictions: Yes LLE Weight Bearing: Weight bearing as tolerated Pain: Pain Assessment Pain Scale: 0-10 Pain Score: 0-No pain   Therapy/Group: Individual Therapy  Doreene Burke, PT, DPT 03/24/2019, 12:50 PM

## 2019-03-24 NOTE — Progress Notes (Signed)
Physical Therapy Session Note  Patient Details  Name: Tamara Gregory MRN: [id number] Date of Birth: Aug 23, 1925  Today's Date: 03/24/2019 PT Individual Time: 1133-1230 PT Individual Time Calculation (min): 57 min   Short Term Goals: Week 2:  PT Short Term Goal 1 (Week 2): = LTG  Skilled Therapeutic Interventions/Progress Updates:    Patient seated in w/c in room and with multiple c/o not being well informed about rehab program prior to admission.  Engaged in activity to get clothes on.  Patient donned pull over shirt with min A and pants with mod A with reacher and max cues.  Patient sit to stand to don pants mod A and cues for hand placement.  Patient sit<>stand two more times and able to ambulate 10' with mod A for walker stabilization and balance cues for sequence.  Patient propelled w/c 120' with S with increased time.  In ortho gym performed car transfer with mod to max A with max cues with RW and increased time.  Able to scoot far enough back and lean back to allow L foot in with assist without bending past 90 degrees.  Patient assisted up out of car max A and able to transfer to w/c with min to mod A with RW. Patient propelled about 37' in w/c toward room with S, then assisted rest of the way and set up with alarm belt and lunch tray with call button in reach.   Therapy Documentation Precautions:  Precautions Precautions: Posterior Hip Precaution Comments: pt able to recall 1/3 precautions at CIR eval  Restrictions Weight Bearing Restrictions: Yes LLE Weight Bearing: Weight bearing as tolerated Pain: Pain Assessment Faces Pain Scale: Hurts little more Pain Type: Surgical pain Pain Location: Hip Pain Orientation: Left Pain Descriptors / Indicators: Guarding;Grimacing Pain Onset: With Activity Pain Intervention(s): Repositioned    Therapy/Group: Individual Therapy  Reginia Naas  Magda Kiel, [place] 03/24/2019, 4:34 PM

## 2019-03-24 NOTE — Plan of Care (Signed)
  Problem: Consults Goal: RH GENERAL PATIENT EDUCATION Description See Patient Education module for education specifics. Outcome: Progressing Goal: Skin Care Protocol Initiated - if Braden Score 18 or less Description If consults are not indicated, leave blank or document N/A Outcome: Progressing Goal: Nutrition Consult-if indicated Outcome: Progressing Goal: Diabetes Guidelines if Diabetic/Glucose > 140 Description If diabetic or lab glucose is > 140 mg/dl - Initiate Diabetes/Hyperglycemia Guidelines & Document Interventions  Outcome: Progressing   Problem: RH BOWEL ELIMINATION Goal: RH STG MANAGE BOWEL WITH ASSISTANCE Description STG Manage Bowel with Min Assistance.  Outcome: Progressing Flowsheets (Taken 03/24/2019 1158) STG: Pt will manage bowels with assistance: 3-Moderate assistance Goal: RH STG MANAGE BOWEL W/MEDICATION W/ASSISTANCE Description STG Manage Bowel with Medication with Assistance. Outcome: Progressing Flowsheets (Taken 03/24/2019 1158) STG: Pt will manage bowels with medication with assistance: 4-Minimal assistance   Problem: RH BLADDER ELIMINATION Goal: RH STG MANAGE BLADDER WITH ASSISTANCE Description STG Manage Bladder With Min Assistance  Outcome: Progressing Flowsheets (Taken 03/24/2019 1158) STG: Pt will manage bladder with assistance: 4-Minimal assistance Goal: RH STG MANAGE BLADDER WITH EQUIPMENT WITH ASSISTANCE Description STG Manage Bladder With Equipment With Min  Assistance  Outcome: Progressing Flowsheets (Taken 03/24/2019 1158) STG: Pt will manage bladder with equipment with assistance: 4-Minimal assistance   Problem: RH SKIN INTEGRITY Goal: RH STG SKIN FREE OF INFECTION/BREAKDOWN Description Patient will be free from skin breakdown during admission  Outcome: Progressing Goal: RH STG MAINTAIN SKIN INTEGRITY WITH ASSISTANCE Description STG Maintain Skin Integrity With  [place].  Outcome: Progressing Flowsheets (Taken 03/24/2019  1158) STG: Maintain skin integrity with assistance: 3-Moderate assistance Goal: RH STG ABLE TO PERFORM INCISION/WOUND CARE W/ASSISTANCE Description STG Able To Perform Incision/Wound Care With [employer].  Outcome: Progressing Flowsheets (Taken 03/24/2019 1158) STG: Pt will be able to perform incision/wound care with assistance: 3-Moderate assistance   Problem: RH SAFETY Goal: RH STG ADHERE TO SAFETY PRECAUTIONS W/ASSISTANCE/DEVICE Description STG Adhere to Safety Precautions With Min Assistance/Device.  Outcome: Progressing Flowsheets (Taken 03/24/2019 1158) STG:Pt will adhere to safety precautions with assistance/device: 3-Moderate assistance   Problem: RH PAIN MANAGEMENT Goal: RH STG PAIN MANAGED AT OR BELOW PT'S PAIN GOAL Description Patient will be pain free or pain less than 3 during admission  Outcome: Progressing   Problem: RH KNOWLEDGE DEFICIT GENERAL Goal: RH STG INCREASE KNOWLEDGE OF SELF CARE AFTER HOSPITALIZATION Outcome: Progressing

## 2019-03-24 NOTE — Progress Notes (Addendum)
[facility] PHYSICAL MEDICINE & REHABILITATION PROGRESS NOTE  Subjective/Complaints: Patient seen laying in bed this morning.  She states she did not sleep well overnight, "just do not".  She states she wants to go home.  ROS: Denies CP, SOB, N/V/D  Objective: Vital Signs: Blood pressure (!) 141/59, pulse (!) 126, temperature 98.2 F (36.8 C), temperature source Oral, resp. rate 12, height 5\' 5"  (1.651 m), weight 67.5 kg, SpO2 94 %. No results found. No results for input(s): WBC, HGB, HCT, PLT in the last 72 hours. Recent Labs    03/24/19 0533  NA 139  K 3.0*  CL 102  CO2 26  GLUCOSE 107*  BUN 18  CREATININE 0.64  CALCIUM 9.2    Physical Exam: BP (!) 141/59   Pulse (!) 126   Temp 98.2 F (36.8 C) (Oral)   Resp 12   Ht 5\' 5"  (1.651 m)   Wt 67.5 kg   SpO2 94%   BMI 24.76 kg/m  Constitutional: No distress . Vital signs reviewed. HENT: Normocephalic.  Atraumatic. Eyes: EOMI.  No discharge. Cardiovascular: No JVD. Respiratory: Normal effort. GI: Non-distended. Musc: No edema or tenderness in extremities. Neurological: She isalert. Makes good eye contact with examiner.  Motor:  RLE 4-4+/5 prox to distal, unchanged.  LLE 3+-4-/5 HF, KE and 4-/5 ADF/PF (pain inhibition), unchanged.  Skin: left hip with dressing C/D/I Multiple scattered ecchymosis Vascular changes distal left lower extremity. Psychiatric: Normal mood.  Normal affect.  Assessment/Plan: 1. Functional deficits secondary to left hip fracture status post bipolar hemiarthroplasty which require 3+ hours per day of interdisciplinary therapy in a comprehensive inpatient rehab setting.  Physiatrist is providing close team supervision and 24 hour management of active medical problems listed below.  Physiatrist and rehab team continue to assess barriers to discharge/monitor patient progress toward functional and medical goals  Care Tool:  Bathing    Body parts bathed by patient: Right arm, Left arm,  Chest, Abdomen, Face, Right upper leg, Left upper leg, Front perineal area   Body parts bathed by helper: Buttocks, Left lower leg, Right lower leg Body parts n/a: Front perineal area, Buttocks(pt cleansed by nursing previously)   Bathing assist Assist Level: Moderate Assistance - Patient 50 - 74%     Upper Body Dressing/Undressing Upper body dressing Upper body dressing/undressing activity did not occur (including orthotics): N/A What is the patient wearing?: Pull over shirt    Upper body assist Assist Level: Supervision/Verbal cueing    Lower Body Dressing/Undressing Lower body dressing    Lower body dressing activity did not occur: N/A What is the patient wearing?: Pants     Lower body assist Assist for lower body dressing: Minimal Assistance - Patient > 75%     Toileting Toileting Toileting Activity did not occur (Clothing management and hygiene only): N/A (no void or bm)  Toileting assist Assist for toileting: Maximal Assistance - Patient 25 - 49%     Transfers Chair/bed transfer  Transfers assist  Chair/bed transfer activity did not occur: N/A  Chair/bed transfer assist level: Moderate Assistance - Patient 50 - 74%     Locomotion Ambulation   Ambulation assist   Ambulation activity did not occur: Safety/medical concerns(fatigue/balance/endurance)  Assist level: Moderate Assistance - Patient 50 - 74% Assistive device: Walker-rolling Max distance: 7'   Walk 10 feet activity   Assist  Walk 10 feet activity did not occur: Safety/medical concerns(fatigue/balance/endurance)        Walk 50 feet activity   Assist Walk 50  feet with 2 turns activity did not occur: Safety/medical concerns(fatigue/balance/endurance)         Walk 150 feet activity   Assist Walk 150 feet activity did not occur: Safety/medical concerns(fatigue/balance/endurance)         Walk 10 feet on uneven surface  activity   Assist Walk 10 feet on uneven surfaces activity  did not occur: Safety/medical concerns(fatigue/balance/endurance)         Wheelchair     Assist Will patient use wheelchair at discharge?: Yes Type of Wheelchair: Manual    Wheelchair assist level: Supervision/Verbal cueing Max wheelchair distance: 100'    Wheelchair 50 feet with 2 turns activity    Assist    Wheelchair 50 feet with 2 turns activity did not occur: Safety/medical concerns(fatigue/balance/endurance)   Assist Level: Supervision/Verbal cueing   Wheelchair 150 feet activity     Assist Wheelchair 150 feet activity did not occur: Safety/medical concerns(fatigue/balance/endurance)          Medical Problem List and Plan: 1.Decreased functional mobilitysecondary to left displaced femoral neck fracture .Status post bipolar hip hemiarthroplasty on 03/11/2019.  Posterior hip precautions weightbearing as tolerated  -Continue CIR, continue 15/7   Team conference today to discuss current and goals and coordination of care, home and environmental barriers, and discharge planning with nursing, case manager, and therapies.  2. Antithrombotics: -DVT/anticoagulation:Subcutaneous Lovenox.   Vascular study limited, but negative for DVT -antiplatelet therapy: N/A 3. Pain Management/chronic back pain:Hydrocodone as needed 4. Mood:provide emotional support -antipsychotic agents: N/A 5. Neuropsych: This patientis not fully capable of making decisions on herown behalf. 6. Skin/Wound Care:Routine skin checks,  -local wound care to hip incision numerous abrasions/lacs in LE's 7. Fluids/Electrolytes/Nutrition:Routine in and out's  P.o. intake remains variable on 4/21 8. Acute blood loss anemia.   Hemoglobin 10.3 on 4/16  Continue to monitor 9. History of ocular myasthenia gravis. Continue chronic prednisone 10. Hypertension. HCTZ 12.5 mg daily   Resumed losartan at 25mg  daily on 4/19  Labile on 4/22 11. Escherichia  coli UTI.    Complete course of Keflex completed on 4/15 12. Hyperlipidemia. Crestor 13. Constipation. Laxative assistance 14.  Hypoalbuminemia  Supplement initiated on 4/14 15.  Leukocytosis: Resolved  WBCs 10.2 on 4/16  Afebrile  Continue to monitor 16.  Steroid-induced hyperglycemia  Changed diet to CM on 4/18 17. Tachycardia  Labile with Tachy this AM, will order ECG 18.  Hypokalemia  Potassium 3.0 on 4/22  Supplemented x 2 days  LOS: 9 days A FACE TO FACE EVALUATION WAS PERFORMED  Tamara Gregory Tamara Gregory 03/24/2019, 8:46 AM

## 2019-03-25 ENCOUNTER — Inpatient Hospital Stay (HOSPITAL_COMMUNITY): Payer: Medicare Other | Admitting: Physical Therapy

## 2019-03-25 ENCOUNTER — Inpatient Hospital Stay (HOSPITAL_COMMUNITY): Payer: Medicare Other | Admitting: Occupational Therapy

## 2019-03-25 MED ORDER — PANTOPRAZOLE SODIUM 40 MG PO TBEC
40.0000 mg | DELAYED_RELEASE_TABLET | Freq: Every day | ORAL | Status: DC
  Administered 2019-03-25 – 2019-03-29 (×5): 40 mg via ORAL
  Filled 2019-03-25 (×5): qty 1

## 2019-03-25 NOTE — Progress Notes (Signed)
Occupational Therapy Note  Patient Details  Name: Tamara Gregory MRN: [id number] Date of Birth: 08-19-1925  Today's Date: 03/25/2019 OT Missed Time: 42 Minutes Missed Time Reason: Patient ill (comment)(nausea)  Pt missed 60 mins scheduled OT treatment session due to reports of nausea.  Pt's nurse tech present upon arrival and reports pt stating nausea this AM.  Therapist attempted to engage pt in functional, motivating tasks however pt continuing to refuse due to nausea and wanting to "just go home".  Will continue to follow as pt able.   Simonne Come 03/25/2019, 11:34 AM

## 2019-03-25 NOTE — Progress Notes (Signed)
[facility] PHYSICAL MEDICINE & REHABILITATION PROGRESS NOTE  Subjective/Complaints: Patient seen laying in bed this morning.  She states she does not sleep well.  She asks me to sign her discharge papers.  ROS: Denies CP, SOB, N/V/D  Objective: Vital Signs: Blood pressure (!) 144/92, pulse 98, temperature 97.7 F (36.5 C), resp. rate 17, height 5\' 5"  (1.651 m), weight 67.5 kg, SpO2 94 %. No results found. No results for input(s): WBC, HGB, HCT, PLT in the last 72 hours. Recent Labs    03/24/19 0533  NA 139  K 3.0*  CL 102  CO2 26  GLUCOSE 107*  BUN 18  CREATININE 0.64  CALCIUM 9.2    Physical Exam: BP (!) 144/92 (BP Location: Left Arm)   Pulse 98   Temp 97.7 F (36.5 C)   Resp 17   Ht 5\' 5"  (1.651 m)   Wt 67.5 kg   SpO2 94%   BMI 24.76 kg/m  Constitutional: No distress . Vital signs reviewed. HENT: Normocephalic.  Atraumatic. Eyes: EOMI.  No discharge. Cardiovascular: No JVD. Respiratory: Normal effort. GI: Non-distended. Musc: No edema or tenderness in extremities. Neurological: She isalert. Makes good eye contact with examiner.  Motor:  RLE 4-4+/5 prox to distal, stable.  LLE 3+-4-/5 HF, KE and 4-/5 ADF/PF (pain inhibition), stable.  Skin: left hip with dressing C/D/I Multiple scattered ecchymosis Vascular changes distal left lower extremity. Psychiatric: Normal mood.  Normal affect.  Assessment/Plan: 1. Functional deficits secondary to left hip fracture status post bipolar hemiarthroplasty which require 3+ hours per day of interdisciplinary therapy in a comprehensive inpatient rehab setting.  Physiatrist is providing close team supervision and 24 hour management of active medical problems listed below.  Physiatrist and rehab team continue to assess barriers to discharge/monitor patient progress toward functional and medical goals  Care Tool:  Bathing    Body parts bathed by patient: Right arm, Left arm, Chest, Abdomen, Face, Right upper leg, Left  upper leg, Front perineal area   Body parts bathed by helper: Buttocks, Left lower leg, Right lower leg Body parts n/a: Front perineal area, Buttocks(pt cleansed by nursing previously)   Bathing assist Assist Level: Moderate Assistance - Patient 50 - 74%     Upper Body Dressing/Undressing Upper body dressing Upper body dressing/undressing activity did not occur (including orthotics): N/A What is the patient wearing?: Pull over shirt    Upper body assist Assist Level: Minimal Assistance - Patient > 75%    Lower Body Dressing/Undressing Lower body dressing    Lower body dressing activity did not occur: N/A What is the patient wearing?: Pants     Lower body assist Assist for lower body dressing: Moderate Assistance - Patient 50 - 74%     Toileting Toileting Toileting Activity did not occur Landscape architect and hygiene only): N/A (no void or bm)  Toileting assist Assist for toileting: Maximal Assistance - Patient 25 - 49%     Transfers Chair/bed transfer  Transfers assist  Chair/bed transfer activity did not occur: N/A  Chair/bed transfer assist level: Moderate Assistance - Patient 50 - 74%(with [place])     Locomotion Ambulation   Ambulation assist   Ambulation activity did not occur: Safety/medical concerns(fatigue/balance/endurance)  Assist level: Moderate Assistance - Patient 50 - 74% Assistive device: Walker-rolling Max distance: 10'   Walk 10 feet activity   Assist  Walk 10 feet activity did not occur: Safety/medical concerns(fatigue/balance/endurance)  Assist level: Moderate Assistance - Patient - 50 - 74% Assistive device: Walker-rolling  Walk 50 feet activity   Assist Walk 50 feet with 2 turns activity did not occur: Safety/medical concerns(fatigue/balance/endurance)         Walk 150 feet activity   Assist Walk 150 feet activity did not occur: Safety/medical concerns(fatigue/balance/endurance)         Walk 10 feet on uneven surface   activity   Assist Walk 10 feet on uneven surfaces activity did not occur: Safety/medical concerns(fatigue/balance/endurance)         Wheelchair     Assist Will patient use wheelchair at discharge?: Yes Type of Wheelchair: Manual    Wheelchair assist level: Supervision/Verbal cueing Max wheelchair distance: 120'    Wheelchair 50 feet with 2 turns activity    Assist    Wheelchair 50 feet with 2 turns activity did not occur: Safety/medical concerns(fatigue/balance/endurance)   Assist Level: Supervision/Verbal cueing   Wheelchair 150 feet activity     Assist Wheelchair 150 feet activity did not occur: Safety/medical concerns(fatigue/balance/endurance)          Medical Problem List and Plan: 1.Decreased functional mobilitysecondary to left displaced femoral neck fracture .Status post bipolar hip hemiarthroplasty on 03/11/2019.  Posterior hip precautions weightbearing as tolerated  -Continue CIR, continue 15/7  2. Antithrombotics: -DVT/anticoagulation:Subcutaneous Lovenox.   Vascular study limited, but negative for DVT -antiplatelet therapy: N/A 3. Pain Management/chronic back pain:Hydrocodone as needed 4. Mood:provide emotional support -antipsychotic agents: N/A 5. Neuropsych: This patientis not fully capable of making decisions on herown behalf. 6. Skin/Wound Care:Routine skin checks,  -local wound care to hip incision numerous abrasions/lacs in LE's 7. Fluids/Electrolytes/Nutrition:Routine in and out's  P.o. intake remains variable on 4/23 8. Acute blood loss anemia.   Hemoglobin 10.3 on 4/16, labs ordered for tomorrow  Continue to monitor 9. History of ocular myasthenia gravis. Continue chronic prednisone 10. Hypertension. HCTZ 12.5 mg daily   Resumed losartan at 25mg  daily on 4/19  Labile on 4/23 11. Escherichia coli UTI.    Complete course of Keflex completed on 4/15 12. Hyperlipidemia.  Crestor 13. Constipation. Laxative assistance 14.  Hypoalbuminemia  Supplement initiated on 4/14 15.  Leukocytosis: Resolved  WBCs 10.2 on 4/16  Afebrile  Continue to monitor 16.  Steroid-induced hyperglycemia  Changed diet to CM on 4/18 17. Tachycardia:?  Resolved  ECG reviewed, stable 18.  Hypokalemia  Potassium 3.0 on 4/22, labs ordered for tomorrow  Supplemented x 2 days  LOS: 10 days A FACE TO FACE EVALUATION WAS PERFORMED  Kieryn Burtis Lorie Phenix 03/25/2019, 12:09 PM

## 2019-03-25 NOTE — Progress Notes (Signed)
Physical Therapy Session Note  Patient Details  Name: SEVILLE BRICK MRN: [id number] Date of Birth: 02-Mar-1925  Today's Date: 03/25/2019 PT Individual Time: 1130-1200 PT Individual Time Calculation (min): 30 min   Short Term Goals: Week 2:  PT Short Term Goal 1 (Week 2): = LTG  Skilled Therapeutic Interventions/Progress Updates:    pt rec'd in bed, agreeable to get out of bed with encouragement.  Min A for supine to sit.  Sit <>stand to don pants with multiple attempts requiring max A due to pt fear of falling.  PT has to stand in front of pt for her to stand safely with RW, total A for clothing management.  Pt performs stand pivot transfer taking 3 attempts due to posterior lean, max A with RW.  Pt performs grooming from w/c level with set up assist.  Pt left in w/c with alarm set, needs at hand.  Therapy Documentation Precautions:  Precautions Precautions: Posterior Hip Precaution Comments: pt able to recall 1/3 precautions at CIR eval  Restrictions Weight Bearing Restrictions: Yes LLE Weight Bearing: Weight bearing as tolerated Pain:  no c/o pain at rest, c/o pain with mobility, eases with rest   Therapy/Group: Individual Therapy  [place] 03/25/2019, 12:06 PM

## 2019-03-26 ENCOUNTER — Ambulatory Visit (HOSPITAL_COMMUNITY): Payer: Medicare Other | Admitting: Physical Therapy

## 2019-03-26 ENCOUNTER — Encounter (HOSPITAL_COMMUNITY): Payer: Medicare Other | Admitting: Occupational Therapy

## 2019-03-26 DIAGNOSIS — D72828 Other elevated white blood cell count: Secondary | ICD-10-CM

## 2019-03-26 DIAGNOSIS — G8918 Other acute postprocedural pain: Secondary | ICD-10-CM

## 2019-03-26 LAB — BASIC METABOLIC PANEL
Anion gap: 12 (ref 5–15)
BUN: 21 mg/dL (ref 8–23)
CO2: 22 mmol/L (ref 22–32)
Calcium: 9.1 mg/dL (ref 8.9–10.3)
Chloride: 105 mmol/L (ref 98–111)
Creatinine, Ser: 0.78 mg/dL (ref 0.44–1.00)
GFR calc Af Amer: >60 mL/min (ref >60)
GFR calc non Af Amer: >60 mL/min (ref >60)
Glucose, Bld: 108 mg/dL — ABNORMAL HIGH (ref 70–99)
Potassium: 4 mmol/L (ref 3.5–5.1)
Sodium: 139 mmol/L (ref 135–145)

## 2019-03-26 LAB — CBC WITH DIFFERENTIAL/PLATELET
Abs Immature Granulocytes: 0.15 10*3/uL — ABNORMAL HIGH (ref 0.00–0.07)
Basophils Absolute: 0 10*3/uL (ref 0.0–0.1)
Basophils Relative: 0 %
Eosinophils Absolute: 0.1 10*3/uL (ref 0.0–0.5)
Eosinophils Relative: 1 %
HCT: 29 % — ABNORMAL LOW (ref 36.0–46.0)
Hemoglobin: 8.8 g/dL — ABNORMAL LOW (ref 12.0–15.0)
Immature Granulocytes: 1 %
Lymphocytes Relative: 13 %
Lymphs Abs: 1.5 10*3/uL (ref 0.7–4.0)
MCH: 28.7 pg (ref 26.0–34.0)
MCHC: 30.3 g/dL (ref 30.0–36.0)
MCV: 94.5 fL (ref 80.0–100.0)
Monocytes Absolute: 2.4 10*3/uL — ABNORMAL HIGH (ref 0.1–1.0)
Monocytes Relative: 21 %
Neutro Abs: 7 10*3/uL (ref 1.7–7.7)
Neutrophils Relative %: 64 %
Platelets: 326 10*3/uL (ref 150–400)
RBC: 3.07 MIL/uL — ABNORMAL LOW (ref 3.87–5.11)
RDW: 16.9 % — ABNORMAL HIGH (ref 11.5–15.5)
WBC: 11.1 10*3/uL — ABNORMAL HIGH (ref 4.0–10.5)
nRBC: 0 % (ref 0.0–0.2)

## 2019-03-26 NOTE — Discharge Instructions (Signed)
Inpatient Rehab Discharge Instructions  Tamara Gregory Discharge date and time: No discharge date for patient encounter.   Activities/Precautions/ Functional Status: Activity: as tolerated with hip precautions Diet: regular diet Wound Care: keep wound clean and dry Functional status:  ___ No restrictions     ___ Walk up steps independently ___ 24/7 supervision/assistance   ___ Walk up steps with assistance ___ Intermittent supervision/assistance  ___ Bathe/dress independently ___ Walk with walker     __x_ Bathe/dress with assistance ___ Walk Independently    ___ Shower independently ___ Walk with assistance    ___ Shower with assistance ___ No alcohol     ___ Return to work/school ________  Special Instructions:  No driving    COMMUNITY REFERRALS UPON DISCHARGE:    Home Health:   [facility]   Date of last service:03/29/2019  Medical Equipment/Items Ordered:3 IN 1  Agency/Supplier:[facility]  [phone]  Other:[facility] CNA FROM 9:00-5;00 PM AND HUSBAND TO PROVIDE THE OTHER HOURS OF CARE-HUSBAND AND SON SETTING THIS UP   My questions have been answered and I understand these instructions. I will adhere to these goals and the provided educational materials after my discharge from the hospital.  Patient/Caregiver Signature _______________________________ Date __________  Clinician Signature _______________________________________ Date __________  Please bring this form and your medication list with you to all your follow-up doctor's appointments.

## 2019-03-26 NOTE — Progress Notes (Addendum)
Social Work Patient ID: Tamara Gregory, female   DOB: 12-09-1924, 85 y.o.   MRN: [id number] Husband and son here for education and it went well aware pt will need 24 hr care. Plan to hire a CNA via [employer] form 9;00-5:00 pm and then husband will provide the evening hours of care. Want to resume [facility] health will contact them regarding this. All aware of recommendation of 24 hr care at home. Son and husband to set up private duty care. Plan for DC Monday. Pt did better today due to family was here.

## 2019-03-26 NOTE — Progress Notes (Signed)
[place] PHYSICAL MEDICINE & REHABILITATION PROGRESS NOTE  Subjective/Complaints: Patient seen sitting up in bed this morning.  She states she slept fairly overnight.  She wants to know her discharge date.  ROS: Denies CP, SOB, N/V/D  Objective: Vital Signs: Blood pressure 124/69, pulse (!) 56, temperature 98.3 F (36.8 C), resp. rate 17, height 5\' 5"  (1.651 m), weight 67.5 kg, SpO2 97 %. No results found. Recent Labs    03/26/19 0551  WBC 11.1*  HGB 8.8*  HCT 29.0*  PLT 326   Recent Labs    03/24/19 0533 03/26/19 0551  NA 139 139  K 3.0* 4.0  CL 102 105  CO2 26 22  GLUCOSE 107* 108*  BUN 18 21  CREATININE 0.64 0.78  CALCIUM 9.2 9.1    Physical Exam: BP 124/69 (BP Location: Left Arm)   Pulse (!) 56   Temp 98.3 F (36.8 C)   Resp 17   Ht 5\' 5"  (1.651 m)   Wt 67.5 kg   SpO2 97%   BMI 24.76 kg/m  Constitutional: No distress . Vital signs reviewed. HENT: Normocephalic.  Atraumatic. Eyes: EOMI.  No discharge. Cardiovascular: No JVD. Respiratory: Normal effort. GI: Non-distended. Musc: No edema or tenderness in extremities. Neurological: She isalert. Makes good eye contact with examiner.  Motor:  RLE 4-4+/5 prox to distal, unchanged.  LLE 4--4/5 HF, KE and 4-/5 ADF/PF (pain inhibition).  Skin: left hip with dressing C/D/I Multiple scattered ecchymosis Vascular changes distal left lower extremity. Psychiatric: Normal mood.  Normal affect.  Assessment/Plan: 1. Functional deficits secondary to left hip fracture status post bipolar hemiarthroplasty which require 3+ hours per day of interdisciplinary therapy in a comprehensive inpatient rehab setting.  Physiatrist is providing close team supervision and 24 hour management of active medical problems listed below.  Physiatrist and rehab team continue to assess barriers to discharge/monitor patient progress toward functional and medical goals  Care Tool:  Bathing    Body parts bathed by patient: Right  arm, Left arm, Chest, Abdomen, Face, Right upper leg, Left upper leg, Front perineal area   Body parts bathed by helper: Buttocks, Left lower leg, Right lower leg Body parts n/a: Front perineal area, Buttocks(pt cleansed by nursing previously)   Bathing assist Assist Level: Moderate Assistance - Patient 50 - 74%     Upper Body Dressing/Undressing Upper body dressing Upper body dressing/undressing activity did not occur (including orthotics): N/A What is the patient wearing?: Pull over shirt    Upper body assist Assist Level: Minimal Assistance - Patient > 75%    Lower Body Dressing/Undressing Lower body dressing    Lower body dressing activity did not occur: N/A What is the patient wearing?: Pants     Lower body assist Assist for lower body dressing: Moderate Assistance - Patient 50 - 74%     Toileting Toileting Toileting Activity did not occur Landscape architect and hygiene only): N/A (no void or bm)  Toileting assist Assist for toileting: Maximal Assistance - Patient 25 - 49%     Transfers Chair/bed transfer  Transfers assist  Chair/bed transfer activity did not occur: N/A  Chair/bed transfer assist level: Moderate Assistance - Patient 50 - 74%(with Stedy)     Locomotion Ambulation   Ambulation assist   Ambulation activity did not occur: Safety/medical concerns(fatigue/balance/endurance)  Assist level: Moderate Assistance - Patient 50 - 74% Assistive device: Walker-rolling Max distance: 10'   Walk 10 feet activity   Assist  Walk 10 feet activity did not occur: Safety/medical  concerns(fatigue/balance/endurance)  Assist level: Moderate Assistance - Patient - 50 - 74% Assistive device: Walker-rolling   Walk 50 feet activity   Assist Walk 50 feet with 2 turns activity did not occur: Safety/medical concerns(fatigue/balance/endurance)         Walk 150 feet activity   Assist Walk 150 feet activity did not occur: Safety/medical  concerns(fatigue/balance/endurance)         Walk 10 feet on uneven surface  activity   Assist Walk 10 feet on uneven surfaces activity did not occur: Safety/medical concerns(fatigue/balance/endurance)         Wheelchair     Assist Will patient use wheelchair at discharge?: Yes Type of Wheelchair: Manual    Wheelchair assist level: Supervision/Verbal cueing Max wheelchair distance: 120'    Wheelchair 50 feet with 2 turns activity    Assist    Wheelchair 50 feet with 2 turns activity did not occur: Safety/medical concerns(fatigue/balance/endurance)   Assist Level: Supervision/Verbal cueing   Wheelchair 150 feet activity     Assist Wheelchair 150 feet activity did not occur: Safety/medical concerns(fatigue/balance/endurance)          Medical Problem List and Plan: 1.Decreased functional mobilitysecondary to left displaced femoral neck fracture .Status post bipolar hip hemiarthroplasty on 03/11/2019.  Posterior hip precautions weightbearing as tolerated  -Continue CIR, continue 15/7   Plan for d/c Monday  Will see patient for hospital follow-up in 1 month post-discharge 2. Antithrombotics: -DVT/anticoagulation:Subcutaneous Lovenox.   Vascular study limited, but negative for DVT -antiplatelet therapy: N/A 3. Pain Management/chronic back pain:Hydrocodone as needed 4. Mood:provide emotional support -antipsychotic agents: N/A 5. Neuropsych: This patientis not fully capable of making decisions on herown behalf. 6. Skin/Wound Care:Routine skin checks,  -local wound care to hip incision numerous abrasions/lacs in LE's 7. Fluids/Electrolytes/Nutrition:Routine in and out's  P.o. intake remains variable on 4/22, no documentation in last 24 hours 8. Acute blood loss anemia.   Hemoglobin 8.8 on 4/24  Continue to monitor 9. History of ocular myasthenia gravis. Continue chronic prednisone 10. Hypertension. HCTZ  12.5 mg daily   Resumed losartan at 25mg  daily on 4/19  Relatively controlled on 4/24 11. Escherichia coli UTI.    Complete course of Keflex completed on 4/15 12. Hyperlipidemia. Crestor 13. Constipation. Laxative assistance 14.  Hypoalbuminemia  Supplement initiated on 4/14 15.  Leukocytosis: Likely steroid-induced  WBCs 11.1 on 4/24  Afebrile  Continue to monitor 16.  Steroid-induced hyperglycemia  Changed diet to CM on 4/18 17. Tachycardia: Resolved  ECG reviewed, stable 18.  Hypokalemia  Potassium 4.0 on 4/24, after supplementation  LOS: 11 days A FACE TO FACE EVALUATION WAS PERFORMED   Lorie Phenix 03/26/2019, 9:54 AM

## 2019-03-26 NOTE — Plan of Care (Signed)
  Problem: Consults Goal: RH GENERAL PATIENT EDUCATION Description See Patient Education module for education specifics. Outcome: Progressing Goal: Skin Care Protocol Initiated - if Braden Score 18 or less Description If consults are not indicated, leave blank or document N/A Outcome: Progressing Goal: Nutrition Consult-if indicated Outcome: Progressing Goal: Diabetes Guidelines if Diabetic/Glucose > 140 Description If diabetic or lab glucose is > 140 mg/dl - Initiate Diabetes/Hyperglycemia Guidelines & Document Interventions  Outcome: Progressing   Problem: RH BOWEL ELIMINATION Goal: RH STG MANAGE BOWEL WITH ASSISTANCE Description STG Manage Bowel with Min Assistance.  Outcome: Progressing Goal: RH STG MANAGE BOWEL W/MEDICATION W/ASSISTANCE Description STG Manage Bowel with Medication with Assistance. Outcome: Progressing   Problem: RH BLADDER ELIMINATION Goal: RH STG MANAGE BLADDER WITH ASSISTANCE Description STG Manage Bladder With Min Assistance  Outcome: Progressing Goal: RH STG MANAGE BLADDER WITH EQUIPMENT WITH ASSISTANCE Description STG Manage Bladder With Equipment With Min  Assistance  Outcome: Progressing   Problem: RH SKIN INTEGRITY Goal: RH STG SKIN FREE OF INFECTION/BREAKDOWN Description Patient will be free from skin breakdown during admission  Outcome: Progressing Goal: RH STG MAINTAIN SKIN INTEGRITY WITH ASSISTANCE Description STG Maintain Skin Integrity With  [place].  Outcome: Progressing Goal: RH STG ABLE TO PERFORM INCISION/WOUND CARE W/ASSISTANCE Description STG Able To Perform Incision/Wound Care With [employer].  Outcome: Progressing   Problem: RH SAFETY Goal: RH STG ADHERE TO SAFETY PRECAUTIONS W/ASSISTANCE/DEVICE Description STG Adhere to Safety Precautions With Min Assistance/Device.  Outcome: Progressing   Problem: RH PAIN MANAGEMENT Goal: RH STG PAIN MANAGED AT OR BELOW PT'S PAIN GOAL Description Patient will be pain  free or pain less than 3 during admission  Outcome: Progressing   Problem: RH KNOWLEDGE DEFICIT GENERAL Goal: RH STG INCREASE KNOWLEDGE OF SELF CARE AFTER HOSPITALIZATION Outcome: Progressing

## 2019-03-26 NOTE — Progress Notes (Signed)
Physical Therapy Session Note  Patient Details  Name: Tamara Gregory MRN: [id number] Date of Birth: [birth year]  Today's Date: 03/26/2019 PT Individual Time: 1130-1230 PT Individual Time Calculation (min): 60 min   Short Term Goals: Week 2:  PT Short Term Goal 1 (Week 2): = LTG  Skilled Therapeutic Interventions/Progress Updates:    session focused on pt/family education with pt's husband.  Pt/husband performed gait, sit <> stand, simulated car transfer and bed mobility all at min A level with pt's husband able to provide min A. Discussed d/c recommendations at length with pt/family and social worker and all are in agreement for HHPT as well as a home health aide to assist with recommended 24/7 supervision.  Discussed use of w/c and RW and BSC at home, all verbalize understanding. Pt left in w/c with needs at hand, family present, alarm set.  Therapy Documentation Precautions:  Precautions Precautions: Posterior Hip Precaution Comments: pt able to recall 1/3 precautions at CIR eval  Restrictions Weight Bearing Restrictions: Yes LLE Weight Bearing: Weight bearing as tolerated Pain: No c/o pain at rest, hip pain with mobility, RN aware   Therapy/Group: Individual Therapy  Thea Holshouser 03/26/2019, 12:36 PM

## 2019-03-26 NOTE — Progress Notes (Signed)
Occupational Therapy Session Note  Patient Details  Name: Tamara Gregory MRN: [id number] Date of Birth: 01/16/1925  Today's Date: 03/26/2019 OT Individual Time: 1002-1110 OT Individual Time Calculation (min): 68 min    Short Term Goals: Week 2:  OT Short Term Goal 1 (Week 2): Patient will recall 3/3 THPs independently OT Short Term Goal 2 (Week 2): Patient will complete stand pivot transfer to toilet/wheelchair with RW with min assist  Skilled Therapeutic Interventions/Progress Updates:    Treatment session with focus on family education with pt, pt's husband, and pt's son.  Pt received supine in bed with family present.  Engaged in bed mobility with min assist and heavy reliance on bed rail for sidelying to sitting.  Discussed recommendation for bed rail for increased independence with bed mobility.  Pt donned pants in sitting at EOB with use of reacher to thread BLE, cues from therapist to thread RLE first.  Mod assist sit > stand from EOB and pt able to alternate UE support on RW to pull pants over hips.  Completed stand pivot transfer bed > w/c with min assist and increased time.  Pt demonstrating increased recall and carryover of education, however continues to require increased time for processing and sequencing of steps.  Ambulated 8' with RW with min assist and then pt requested to sit.  Completed stand pivot transfer w/c <> BSC with Min assist and increased tactile cues to facilitate weight shift.  Pt's husband completed stand pivot transfer BSC > w/c with min assist.  Pt able to recall technique and even able to correctly instruct husband in how to assist.    Engaged in prolonged discussion regarding pt goals and family goals as they did not always coincide.  Family asking questions about the purchase of a Stedy while discussing pros and cons of use of lift with pt goals.  Discussed importance of increased mobility to hip and overall activity tolerance to continue to increase activity  tolerance, mobility, and decrease pain.  Pt left upright in w/c with family members present.  Therapy Documentation Precautions:  Precautions Precautions: Posterior Hip Precaution Comments: pt able to recall 1/3 precautions at CIR eval  Restrictions Weight Bearing Restrictions: Yes LLE Weight Bearing: Weight bearing as tolerated Pain:  Pt with c/o pain in Lt hip, rated 8/10.  RN notified.   Therapy/Group: Individual Therapy  Simonne Come 03/26/2019, 12:06 PM

## 2019-03-27 ENCOUNTER — Inpatient Hospital Stay (HOSPITAL_COMMUNITY): Payer: Medicare Other | Admitting: Physical Therapy

## 2019-03-27 ENCOUNTER — Inpatient Hospital Stay (HOSPITAL_COMMUNITY): Payer: Medicare Other | Admitting: Occupational Therapy

## 2019-03-27 NOTE — Progress Notes (Signed)
At 1551 BP was noted to be 81/50. BP checked several times. Ted hose applied and BOP checked standing, sitting, and lying. BP came back up to 120/66 standing, 98/69 lying. Dr. Letta Pate made aware. No new orders at this time.  Pt complained of nausea around 1815. Zofran and ginger ale given. Pt states feeling better now. Pt currently in bed eating dinner with call bell within reach. Continue plan of care.    Tamara Gregory W Obdulia Steier

## 2019-03-27 NOTE — Progress Notes (Signed)
[place] PHYSICAL MEDICINE & REHABILITATION PROGRESS NOTE  Subjective/Complaints: Did not sleep well but otherwise no c/o no bowel or bladder or pain issues overnite  ROS: Denies CP, SOB, N/V/D  Objective: Vital Signs: Blood pressure 134/77, pulse 90, temperature (!) 97.5 F (36.4 C), temperature source Oral, resp. rate 20, height 5\' 5"  (1.651 m), weight 67.5 kg, SpO2 94 %. No results found. Recent Labs    03/26/19 0551  WBC 11.1*  HGB 8.8*  HCT 29.0*  PLT 326   Recent Labs    03/26/19 0551  NA 139  K 4.0  CL 105  CO2 22  GLUCOSE 108*  BUN 21  CREATININE 0.78  CALCIUM 9.1    Physical Exam: BP 134/77 (BP Location: Left Arm)   Pulse 90   Temp (!) 97.5 F (36.4 C) (Oral)   Resp 20   Ht 5\' 5"  (1.651 m)   Wt 67.5 kg   SpO2 94%   BMI 24.76 kg/m  Constitutional: No distress . Vital signs reviewed. HENT: Normocephalic.  Atraumatic. Eyes: EOMI.  No discharge. Cardiovascular: No JVD. Respiratory: Normal effort. GI: Non-distended. Musc: No edema or tenderness in extremities. Neurological: She isalert. Makes good eye contact with examiner.  Motor:  RLE 4-4+/5 prox to distal, unchanged.  LLE 4--4/5 HF, KE and 4-/5 ADF/PF (pain inhibition).  Skin: left hip with dressing C/D/I Multiple scattered ecchymosis Vascular changes distal left lower extremity. Psychiatric: Normal mood.  Normal affect.  Assessment/Plan: 1. Functional deficits secondary to left hip fracture status post bipolar hemiarthroplasty which require 3+ hours per day of interdisciplinary therapy in a comprehensive inpatient rehab setting.  Physiatrist is providing close team supervision and 24 hour management of active medical problems listed below.  Physiatrist and rehab team continue to assess barriers to discharge/monitor patient progress toward functional and medical goals  Care Tool:  Bathing    Body parts bathed by patient: Right arm, Left arm, Chest, Abdomen, Face, Right upper leg, Left  upper leg, Front perineal area   Body parts bathed by helper: Buttocks, Left lower leg, Right lower leg Body parts n/a: Front perineal area, Buttocks(pt cleansed by nursing previously)   Bathing assist Assist Level: Moderate Assistance - Patient 50 - 74%     Upper Body Dressing/Undressing Upper body dressing Upper body dressing/undressing activity did not occur (including orthotics): N/A What is the patient wearing?: Pull over shirt    Upper body assist Assist Level: Minimal Assistance - Patient > 75%    Lower Body Dressing/Undressing Lower body dressing    Lower body dressing activity did not occur: N/A What is the patient wearing?: Pants     Lower body assist Assist for lower body dressing: Minimal Assistance - Patient > 75%     Toileting Toileting Toileting Activity did not occur (Clothing management and hygiene only): N/A (no void or bm)  Toileting assist Assist for toileting: Maximal Assistance - Patient 25 - 49%     Transfers Chair/bed transfer  Transfers assist  Chair/bed transfer activity did not occur: N/A  Chair/bed transfer assist level: Minimal Assistance - Patient > 75%     Locomotion Ambulation   Ambulation assist   Ambulation activity did not occur: Safety/medical concerns(fatigue/balance/endurance)  Assist level: Minimal Assistance - Patient > 75% Assistive device: Walker-rolling Max distance: 10'   Walk 10 feet activity   Assist  Walk 10 feet activity did not occur: Safety/medical concerns(fatigue/balance/endurance)  Assist level: Minimal Assistance - Patient > 75% Assistive device: Walker-rolling   Walk 50  feet activity   Assist Walk 50 feet with 2 turns activity did not occur: Safety/medical concerns(fatigue/balance/endurance)         Walk 150 feet activity   Assist Walk 150 feet activity did not occur: Safety/medical concerns(fatigue/balance/endurance)         Walk 10 feet on uneven surface  activity   Assist Walk  10 feet on uneven surfaces activity did not occur: Safety/medical concerns(fatigue/balance/endurance)         Wheelchair     Assist Will patient use wheelchair at discharge?: Yes Type of Wheelchair: Manual    Wheelchair assist level: Supervision/Verbal cueing Max wheelchair distance: 50    Wheelchair 50 feet with 2 turns activity    Assist    Wheelchair 50 feet with 2 turns activity did not occur: Safety/medical concerns(fatigue/balance/endurance)   Assist Level: Supervision/Verbal cueing   Wheelchair 150 feet activity     Assist Wheelchair 150 feet activity did not occur: Safety/medical concerns(fatigue/balance/endurance)          Medical Problem List and Plan: 1.Decreased functional mobilitysecondary to left displaced femoral neck fracture .Status post bipolar hip hemiarthroplasty on 03/11/2019.  Posterior hip precautions weightbearing as tolerated  -Continue CIR, continue 15/7   Plan for d/c 4/27   2. Antithrombotics: -DVT/anticoagulation:Subcutaneous Lovenox.   Vascular study limited, but negative for DVT -antiplatelet therapy: N/A 3. Pain Management/chronic back pain:Hydrocodone as needed 4. Mood:provide emotional support -antipsychotic agents: N/A 5. Neuropsych: This patientis not fully capable of making decisions on herown behalf. 6. Skin/Wound Care:Routine skin checks,  -local wound care to hip incision numerous abrasions/lacs in LE's 7. Fluids/Electrolytes/Nutrition:Routine in and out's  P.o. intake remains variable on 4/22, no documentation in last 24 hours 8. Acute blood loss anemia.   Hemoglobin 8.8 on 4/24  Continue to monitor 9. History of ocular myasthenia gravis. Continue chronic prednisone 10. Hypertension. HCTZ 12.5 mg daily   Resumed losartan at 25mg  daily on 4/19   Vitals:   03/26/19 1942 03/27/19 0450  BP: 91/62 134/77  Pulse: 67 90  Resp: 16 20  Temp: 97.9 F (36.6 C) (!)  97.5 F (36.4 C)  SpO2: 95% 94%  controlled 4/25 11. Escherichia coli UTI.    Complete course of Keflex completed on 4/15 12. Hyperlipidemia. Crestor 13. Constipation. Laxative assistance 14.  Hypoalbuminemia  Supplement initiated on 4/14 15.  Leukocytosis: Likely steroid-induced  WBCs 11.1 on 4/24  Afebrile  Continue to monitor 16.  Steroid-induced hyperglycemia, Serum gluc 108 on 4/24  Changed diet to CM on 4/18 CBG (last 3)     17. Tachycardia: Resolved   18.  Hypokalemia  Potassium 4.0 on 4/24, after supplementation  LOS: 12 days A FACE TO FACE EVALUATION WAS PERFORMED  Charlett Blake 03/27/2019, 7:54 AM

## 2019-03-27 NOTE — Progress Notes (Signed)
Occupational Therapy Session Note  Patient Details  Name: Tamara Gregory MRN: [id number] Date of Birth: [birth year]  Today's Date: 03/27/2019 OT Individual Time: 4481-8563 OT Individual Time Calculation (min): 75 min    Short Term Goals: Week 2:  OT Short Term Goal 1 (Week 2): Patient will recall 3/3 THPs independently OT Short Term Goal 2 (Week 2): Patient will complete stand pivot transfer to toilet/wheelchair with RW with min assist  Skilled Therapeutic Interventions/Progress Updates: kpatiaeat in bed upon akpproach for OT.   Focus of session was transfers bed to wc and w/c to from toileting (Min A with cues to scoot forward to ease sit to squat/stand) and extra time.    Patient fatigued with all movements.  She required encouragement to doff her night shirt independently but was able to complete upper body bathing and dressin of pullover blouse with setup. 40 minutes into session, she determined she needed to use the toilet and c/o of much fatigue and exhibited SOB and stated, "I am just too tired to do anything else.   I cannot even wipe my bottom or pull up my pants."    Though she completed the toilet to w/c transfer stand pivot holding onto grab bars and taking 3 steps to sit, she did require assist to pull up/down pants, and pericleanse.  Energy conservation and work simplification is important for [name], as she is able to complete transfers this session with min A and benefited from many rest breaks and breaking the tasks into short chunks.  Continue OT Plan of Care.   Patient stated she plans to leave the hospital early next week.    Therapy Documentation Precautions:  Precautions Precautions: Posterior Hip Precaution Comments: pt able to recall 1/3 precautions at CIR eval  Restrictions Weight Bearing Restrictions: Yes LLE Weight Bearing: Weight bearing as tolerated Pain:7/10  RN gave meds  Therapy/Group: Individual Therapy  Alfredia Ferguson [facility] 03/27/2019, 10:38 AM

## 2019-03-27 NOTE — Progress Notes (Signed)
Physical Therapy Session Note  Patient Details  Name: Tamara Gregory MRN: [id number] Date of Birth: Sep 28, 1925  Today's Date: 03/27/2019 PT Individual Time: 1015-1100 PT Individual Time Calculation (min): 45 min   Short Term Goals: Week 2:  PT Short Term Goal 1 (Week 2): = LTG  Skilled Therapeutic Interventions/Progress Updates:    Patient received up in [facility] today, pleasant and willing to participate in PT, aware she is going home in a couple of days. Very talkative and required frequent redirection, also frequent rests and bouts of encouragement due to pain in L hip with weight bearing. Tolerated gait training 3f, 153f and 1035fith RW and min guard as well as cues for sequencing and safety with RW, continues to require MinA for boost to standing with RW today. Otherwise worked on functional exercises for  B LEs in sitting within precautions and as tolerated today. She was left up in her WC with seat belt alarm active, all needs otherwise met this morning.   Therapy Documentation Precautions:  Precautions Precautions: Posterior Hip Precaution Comments: pt able to recall 1/3 precautions at CIR eval  Restrictions Weight Bearing Restrictions: Yes LLE Weight Bearing: Weight bearing as tolerated   Pain: Pain Assessment Pain Scale: Faces Faces Pain Scale: Hurts a little bit Pain Type: Acute pain;Surgical pain Pain Location: Hip Pain Orientation: Left Pain Descriptors / Indicators: Aching Pain Onset: With Activity Patients Stated Pain Goal: 0 Pain Intervention(s): Repositioned;Ambulation/increased activity Multiple Pain Sites: No    Therapy/Group: Individual Therapy   KriDeniece Ree, DPT, CBIS  Supplemental Physical Therapist [facility] Pager [phone] Rehab Office [phone] 03/27/2019, 2:05 PM

## 2019-03-27 NOTE — Plan of Care (Signed)
  Problem: Consults Goal: RH GENERAL PATIENT EDUCATION Description See Patient Education module for education specifics. Outcome: Progressing Goal: Skin Care Protocol Initiated - if Braden Score 18 or less Description If consults are not indicated, leave blank or document N/A Outcome: Progressing Goal: Nutrition Consult-if indicated Outcome: Progressing Goal: Diabetes Guidelines if Diabetic/Glucose > 140 Description If diabetic or lab glucose is > 140 mg/dl - Initiate Diabetes/Hyperglycemia Guidelines & Document Interventions  Outcome: Progressing   Problem: RH BOWEL ELIMINATION Goal: RH STG MANAGE BOWEL WITH ASSISTANCE Description STG Manage Bowel with Min Assistance.  Outcome: Progressing Goal: RH STG MANAGE BOWEL W/MEDICATION W/ASSISTANCE Description STG Manage Bowel with Medication with Assistance. Outcome: Progressing   Problem: RH BLADDER ELIMINATION Goal: RH STG MANAGE BLADDER WITH ASSISTANCE Description STG Manage Bladder With Min Assistance  Outcome: Progressing Goal: RH STG MANAGE BLADDER WITH EQUIPMENT WITH ASSISTANCE Description STG Manage Bladder With Equipment With Min  Assistance  Outcome: Progressing   Problem: RH SKIN INTEGRITY Goal: RH STG SKIN FREE OF INFECTION/BREAKDOWN Description Patient will be free from skin breakdown during admission  Outcome: Progressing Goal: RH STG MAINTAIN SKIN INTEGRITY WITH ASSISTANCE Description STG Maintain Skin Integrity With  [place].  Outcome: Progressing Goal: RH STG ABLE TO PERFORM INCISION/WOUND CARE W/ASSISTANCE Description STG Able To Perform Incision/Wound Care With [employer].  Outcome: Progressing   Problem: RH SAFETY Goal: RH STG ADHERE TO SAFETY PRECAUTIONS W/ASSISTANCE/DEVICE Description STG Adhere to Safety Precautions With Min Assistance/Device.  Outcome: Progressing   Problem: RH PAIN MANAGEMENT Goal: RH STG PAIN MANAGED AT OR BELOW PT'S PAIN GOAL Description Patient will be pain  free or pain less than 3 during admission  Outcome: Progressing   Problem: RH KNOWLEDGE DEFICIT GENERAL Goal: RH STG INCREASE KNOWLEDGE OF SELF CARE AFTER HOSPITALIZATION Outcome: Progressing

## 2019-03-28 ENCOUNTER — Inpatient Hospital Stay (HOSPITAL_COMMUNITY): Payer: Medicare Other

## 2019-03-28 NOTE — Plan of Care (Signed)
  Problem: Consults Goal: RH GENERAL PATIENT EDUCATION Description See Patient Education module for education specifics. Outcome: Progressing Goal: Skin Care Protocol Initiated - if Braden Score 18 or less Description If consults are not indicated, leave blank or document N/A Outcome: Progressing Goal: Nutrition Consult-if indicated Outcome: Progressing Goal: Diabetes Guidelines if Diabetic/Glucose > 140 Description If diabetic or lab glucose is > 140 mg/dl - Initiate Diabetes/Hyperglycemia Guidelines & Document Interventions  Outcome: Progressing   Problem: RH BOWEL ELIMINATION Goal: RH STG MANAGE BOWEL WITH ASSISTANCE Description STG Manage Bowel with Min Assistance.  Outcome: Progressing Goal: RH STG MANAGE BOWEL W/MEDICATION W/ASSISTANCE Description STG Manage Bowel with Medication with Assistance. Outcome: Progressing   Problem: RH BLADDER ELIMINATION Goal: RH STG MANAGE BLADDER WITH ASSISTANCE Description STG Manage Bladder With Min Assistance  Outcome: Progressing Goal: RH STG MANAGE BLADDER WITH EQUIPMENT WITH ASSISTANCE Description STG Manage Bladder With Equipment With Min  Assistance  Outcome: Progressing   Problem: RH SKIN INTEGRITY Goal: RH STG SKIN FREE OF INFECTION/BREAKDOWN Description Patient will be free from skin breakdown during admission  Outcome: Progressing Goal: RH STG MAINTAIN SKIN INTEGRITY WITH ASSISTANCE Description STG Maintain Skin Integrity With  [place].  Outcome: Progressing Goal: RH STG ABLE TO PERFORM INCISION/WOUND CARE W/ASSISTANCE Description STG Able To Perform Incision/Wound Care With [employer].  Outcome: Progressing   Problem: RH SAFETY Goal: RH STG ADHERE TO SAFETY PRECAUTIONS W/ASSISTANCE/DEVICE Description STG Adhere to Safety Precautions With Min Assistance/Device.  Outcome: Progressing   Problem: RH PAIN MANAGEMENT Goal: RH STG PAIN MANAGED AT OR BELOW PT'S PAIN GOAL Description Patient will be pain  free or pain less than 3 during admission  Outcome: Progressing   Problem: RH KNOWLEDGE DEFICIT GENERAL Goal: RH STG INCREASE KNOWLEDGE OF SELF CARE AFTER HOSPITALIZATION Outcome: Progressing

## 2019-03-28 NOTE — Progress Notes (Signed)
Physical Therapy Discharge Summary  Patient Details  Name: Tamara Gregory MRN: [id number] Date of Birth: 1924-12-10  Today's Date: 03/28/2019 PT Individual Time: 1100-1155 PT Individual Time Calculation (min): 55 min    Patient has met 9 of 9 long term goals due to improved activity tolerance, improved balance, increased strength, increased range of motion and decreased pain.  Patient to discharge at an ambulatory level [place].   Patient's care partner is independent to provide the necessary physical assistance at discharge. Pt's family attended family education session with therapist.   All goals met.   Recommendation:  Patient will benefit from ongoing skilled PT services in home health setting to continue to advance safe functional mobility, address ongoing impairments in strength, ROM, balance, and minimize fall risk.  Equipment: No equipment provided  Reasons for discharge: treatment goals met  Patient/family agrees with progress made and goals achieved: Yes   PT Treatment Interventions: Pt supine in bed upon PT arrival, agreeable to therapy tx and denies pain at rest. Pt reports being tired from her previous session but willing to do therapy. Pt transferred to sitting EOB, bed mobility with min assist this session and cues for techniques. Therapist reminded pt of posterior hip precautions. Pt ambulated x 10 ft this session with RW and CGA, verbal cues for techniques. Pt transferred to w/c and propelled w/c x 150 ft this session using B UEs and increased time to complete. Pt performed stand pivot with RW for car transfer this session, cues for techniques and min assist. Pt transported to the gym. Pt worked on standing balance this session with CGA while performing card matching activity, RW for UE support. Pt transported back to room and left in w/c with needs in reach and chair alarm set.   PT Discharge Precautions/Restrictions Precautions Precautions: Posterior  Hip Restrictions Weight Bearing Restrictions: Yes LLE Weight Bearing: Weight bearing as tolerated Vital Signs Therapy Vitals Temp: 97.6 F (36.4 C) Temp Source: Oral Pulse Rate: 65 Resp: 18 BP: 117/71 Patient Position (if appropriate): Lying Oxygen Therapy SpO2: 94 % O2 Device: Room Air Pain Pain Assessment Pain Scale: 0-10 Pain Score: 0-No pain Cognition Overall Cognitive Status: Within Functional Limits for tasks assessed Arousal/Alertness: Awake/alert Orientation Level: Oriented to person;Oriented to place;Oriented to situation Attention: Sustained Sustained Attention: Appears intact Memory: Impaired Memory Impairment: Decreased recall of new information;Decreased short term memory Safety/Judgment: Appears intact Sensation Sensation Light Touch: Appears Intact Proprioception: Appears Intact Additional Comments: sensation appears intact Coordination Gross Motor Movements are Fluid and Coordinated: No Fine Motor Movements are Fluid and Coordinated: No Coordination and Movement Description: LE coordination limited by gross weakness and pain  Motor  Motor Motor: Within Functional Limits Motor - Discharge Observations: generalized weakness  Mobility Bed Mobility Bed Mobility: Rolling Right;Rolling Left;Supine to Sit;Sit to Supine Rolling Right: Minimal Assistance - Patient > 75% Rolling Left: Minimal Assistance - Patient > 75% Supine to Sit: Minimal Assistance - Patient > 75% Sit to Supine: Minimal Assistance - Patient > 75% Transfers Transfers: Sit to Stand;Stand to Sit;Stand Pivot Transfers Sit to Stand: Minimal Assistance - Patient > 75% Stand to Sit: Minimal Assistance - Patient > 75% Stand Pivot Transfers: Minimal Assistance - Patient > 75% Stand Pivot Transfer Details: Tactile cues for posture;Tactile cues for placement;Verbal cues for technique;Verbal cues for sequencing;Manual facilitation for placement Transfer (Assistive device): Rolling  walker Locomotion  Gait Ambulation: Yes Gait Assistance: Contact Guard/Touching assist Gait Distance (Feet): 10 Feet Assistive device: Rolling walker Gait Gait: Yes Gait  Pattern: Impaired Architect: Yes Wheelchair Assistance: Chartered loss adjuster: Both upper extremities Wheelchair Parts Management: Needs assistance Distance: 150 ft  Trunk/Postural Assessment  Cervical Assessment Cervical Assessment: Exceptions to WFL(forward head posture) Thoracic Assessment Thoracic Assessment: Exceptions to WFL(rounded shoulders) Lumbar Assessment Lumbar Assessment: Exceptions to WFL(posterior pelvic tilt) Postural Control Postural Control: Within Functional Limits  Balance Balance Balance Assessed: Yes Static Sitting Balance Static Sitting - Level of Assistance: 5: Stand by assistance Dynamic Sitting Balance Dynamic Sitting - Level of Assistance: 5: Stand by assistance Static Standing Balance Static Standing - Level of Assistance: 4: Min assist Dynamic Standing Balance Dynamic Standing - Level of Assistance: 4: Min assist Extremity Assessment   RLE Assessment RLE Assessment: Within Functional Limits LLE Assessment LLE Assessment: Exceptions to [facility] Passive Range of Motion (PROM) Comments: hip ROM limited secondary to posterior hip precautions General Strength Comments: difficult to formally assess secondary to pain, grossly 2+ to 3/5 throughout    [employer], PT, DPT 03/28/2019, 11:19 AM

## 2019-03-28 NOTE — Progress Notes (Signed)
Tamara Gregory PROGRESS NOTE  Subjective/Complaints: BP on low side yesterday afternoon wit horthostatic, systolic down to 78 with sitting  but not dizzy, later rechecked and sit to stand  Systolic were [id number] and 193mmHg  ROS: Denies CP, SOB, N/V/D  Objective: Vital Signs: Blood pressure (!) 151/87, pulse 79, temperature 98.4 F (36.9 C), temperature source Oral, resp. rate 18, height 5\' 5"  (1.651 m), weight 67.5 kg, SpO2 91 %. No results found. Recent Labs    03/26/19 0551  WBC 11.1*  HGB 8.8*  HCT 29.0*  PLT 326   Recent Labs    03/26/19 0551  NA 139  K 4.0  CL 105  CO2 22  GLUCOSE 108*  BUN 21  CREATININE 0.78  CALCIUM 9.1    Physical Exam: BP (!) 151/87 (BP Location: Left Arm)   Pulse 79   Temp 98.4 F (36.9 C) (Oral)   Resp 18   Ht 5\' 5"  (1.651 m)   Wt 67.5 kg   SpO2 91%   BMI 24.76 kg/m  Constitutional: No distress . Vital signs reviewed. HENT: Normocephalic.  Atraumatic. Eyes: EOMI.  No discharge. Cardiovascular: No JVD. Respiratory: Normal effort. GI: Non-distended. Musc: No edema or tenderness in extremities. Neurological: She isalert. Makes good eye contact with examiner.  Motor:  RLE 4-4+/5 prox to distal, unchanged.  LLE 4--4/5 HF, KE and 4-/5 ADF/PF (pain inhibition).  Skin: left hip with dressing C/D/I Multiple scattered ecchymosis Vascular changes distal left lower extremity. Psychiatric: Normal mood.  Normal affect.  Assessment/Plan: 1. Functional deficits secondary to left hip fracture status post bipolar hemiarthroplasty which require 3+ hours per day of interdisciplinary therapy in a comprehensive inpatient rehab setting.  Physiatrist is providing close team supervision and 24 hour management of active medical problems listed below.  Physiatrist and rehab team continue to assess barriers to discharge/monitor patient progress toward functional and medical goals  Care Tool:  Bathing    Body  parts bathed by patient: Right arm, Left arm, Chest, Abdomen, Face, Right upper leg, Left upper leg, Front perineal area   Body parts bathed by helper: Buttocks, Left lower leg, Right lower leg Body parts n/a: Front perineal area, Buttocks(pt cleansed by nursing previously)   Bathing assist Assist Level: Moderate Assistance - Patient 50 - 74%     Upper Body Dressing/Undressing Upper body dressing Upper body dressing/undressing activity did not occur (including orthotics): N/A What is the patient wearing?: Pull over shirt    Upper body assist Assist Level: Minimal Assistance - Patient > 75%    Lower Body Dressing/Undressing Lower body dressing    Lower body dressing activity did not occur: N/A What is the patient wearing?: Pants     Lower body assist Assist for lower body dressing: Minimal Assistance - Patient > 75%     Toileting Toileting Toileting Activity did not occur (Clothing management and hygiene only): N/A (no void or bm)  Toileting assist Assist for toileting: Maximal Assistance - Patient 25 - 49%     Transfers Chair/bed transfer  Transfers assist  Chair/bed transfer activity did not occur: N/A  Chair/bed transfer assist level: Moderate Assistance - Patient 50 - 74%     Locomotion Ambulation   Ambulation assist   Ambulation activity did not occur: Safety/medical concerns(fatigue/balance/endurance)  Assist level: Contact Guard/Touching assist Assistive device: Walker-rolling Max distance: 13ft    Walk 10 feet activity   Assist  Walk 10 feet activity did not occur: Safety/medical concerns(fatigue/balance/endurance)  Assist level:  Contact Guard/Touching assist Assistive device: Walker-rolling   Walk 50 feet activity   Assist Walk 50 feet with 2 turns activity did not occur: Safety/medical concerns(fatigue/balance/endurance)         Walk 150 feet activity   Assist Walk 150 feet activity did not occur: Safety/medical  concerns(fatigue/balance/endurance)         Walk 10 feet on uneven surface  activity   Assist Walk 10 feet on uneven surfaces activity did not occur: Safety/medical concerns(fatigue/balance/endurance)         Wheelchair     Assist Will patient use wheelchair at discharge?: Yes Type of Wheelchair: Manual    Wheelchair assist level: Supervision/Verbal cueing Max wheelchair distance: 50    Wheelchair 50 feet with 2 turns activity    Assist    Wheelchair 50 feet with 2 turns activity did not occur: Safety/medical concerns(fatigue/balance/endurance)   Assist Level: Supervision/Verbal cueing   Wheelchair 150 feet activity     Assist Wheelchair 150 feet activity did not occur: Safety/medical concerns(fatigue/balance/endurance)          Medical Problem List and Plan: 1.Decreased functional mobilitysecondary to left displaced femoral neck fracture .Status post bipolar hip hemiarthroplasty on 03/11/2019.  Posterior hip precautions weightbearing as tolerated  -Continue CIR, continue 15/7   Plan for d/c 4/27   2. Antithrombotics: -DVT/anticoagulation:Subcutaneous Lovenox.   Vascular study limited, but negative for DVT -antiplatelet therapy: N/A 3. Pain Management/chronic back pain:Hydrocodone as needed 4. Mood:provide emotional support -antipsychotic agents: N/A 5. Neuropsych: This patientis not fully capable of making decisions on herown behalf. 6. Skin/Wound Care:Routine skin checks,  -local wound care to hip incision numerous abrasions/lacs in LE's 7. Fluids/Electrolytes/Nutrition:Routine in and out's  P.o. intake remains variable on 4/22, no documentation in last 24 hours 8. Acute blood loss anemia.   Hemoglobin 8.8 on 4/24  Continue to monitor 9. History of ocular myasthenia gravis. Continue chronic prednisone 10. Hypertension. HCTZ 12.5 mg daily   Resumed losartan at 25mg  daily on 4/19   Vitals:    03/27/19 1915 03/28/19 0527  BP: (!) 110/53 (!) 151/87  Pulse: 99 79  Resp: 18 18  Temp: 98.3 F (36.8 C) 98.4 F (36.9 C)  SpO2: 97% 91%  elevated this am , orthostatic vitals inconsistent and pt reportedly asymptomatic even with low systolic BPs will cont to monitor 11. Escherichia coli UTI.    Complete course of Keflex completed on 4/15 12. Hyperlipidemia. Crestor 13. Constipation. Laxative assistance 14.  Hypoalbuminemia  Supplement initiated on 4/14 15.  Leukocytosis: Likely steroid-induced  WBCs 11.1 on 4/24  Afebrile  Continue to monitor 16.  Steroid-induced hyperglycemia, Serum gluc 108 on 4/24  Changed diet to CM on 4/18 CBG (last 3)     17. Tachycardia: Resolved   18.  Hypokalemia  Potassium 4.0 on 4/24, after supplementation  LOS: 13 days A FACE TO Foster E Kirsteins 03/28/2019, 7:24 AM

## 2019-03-28 NOTE — Progress Notes (Signed)
Occupational Therapy Discharge Summary  Patient Details  Name: Tamara Gregory MRN: [id number] Date of Birth: May 23, 1925  Today's Date: 03/28/2019 OT Individual Time: 1500-1530 OT Individual Time Calculation (min): 30 min    Patient has met 11 of 11 long term goals due to improved activity tolerance, improved balance, postural control, ability to compensate for deficits, functional use of  LEFT lower extremity, improved attention and improved awareness.  Patient to discharge at [facility] Assist level.  Patient's care partner is independent to provide the necessary physical and cognitive assistance at discharge.  Hands on family education has been completed with pt's husband and son.   Reasons goals not met: All treatment goals met  Recommendation:  Patient will benefit from ongoing skilled OT services in home health setting to continue to advance functional skills in the area of BADL.  Equipment: 3 in 1 commode  Reasons for discharge: treatment goals met and discharge from hospital  Patient/family agrees with progress made and goals achieved: Yes   Skilled OT Intervention: Session focused on discussing d/c planning. Discussed return to ADL and IADL and grading return to activity, with multiple ideas shared. Pt disclosed feelings of depression at home with chronic back pain and reduction in participation in ADLs. Encouraged pt to share feelings with family and used motivational interviewing techniques to guide pt toward selecting 1-2 activities daily that she (not her husband) will complete to help increase both self-efficacy and reduce caregiver burden to husband. Pt receptive and contemplative throughout session. Pt left supine with all needs met, no c/o pain.   OT Discharge Precautions/Restrictions  Precautions Precautions: Posterior Hip;Fall Precaution Booklet Issued: Yes (comment) Precaution Comments: Handout provided in binder Restrictions Weight Bearing Restrictions: Yes LLE  Weight Bearing: Weight bearing as tolerated Pain Pain Assessment Pain Scale: 0-10 Pain Score: 5  Faces Pain Scale: No hurt Pain Type: Acute pain;Surgical pain Pain Location: Hip Pain Orientation: Left Pain Descriptors / Indicators: Sore Pain Frequency: Intermittent Pain Onset: On-going Pain Intervention(s): Emotional support Multiple Pain Sites: No ADL ADL Eating: Supervision/safety Where Assessed-Eating: Bed level Grooming: Minimal assistance Where Assessed-Grooming: Sitting at sink, Wheelchair Upper Body Bathing: Minimal assistance Where Assessed-Upper Body Bathing: Wheelchair, Sitting at sink Lower Body Bathing: Maximal assistance Where Assessed-Lower Body Bathing: Bed level Upper Body Dressing: Minimal assistance Where Assessed-Upper Body Dressing: Wheelchair Lower Body Dressing: Dependent Where Assessed-Lower Body Dressing: Bed level Toileting: Other (Comment)(incontinent of urine in brief) Vision Baseline Vision/History: Wears glasses Wears Glasses: At all times Patient Visual Report: No change from baseline Vision Assessment?: No apparent visual deficits Perception  Perception: Within Functional Limits Praxis Praxis: Intact Cognition Overall Cognitive Status: Within Functional Limits for tasks assessed Arousal/Alertness: Awake/alert Orientation Level: Oriented to person;Oriented to place;Oriented to situation;Disoriented to time Attention: Sustained Sustained Attention: Appears intact Sustained Attention Impairment: Verbal basic;Functional basic Memory: Impaired Memory Impairment: Decreased recall of new information;Decreased short term memory Decreased Short Term Memory: Verbal basic;Functional basic Awareness: Impaired Awareness Impairment: Emergent impairment Problem Solving: Impaired Problem Solving Impairment: Verbal basic;Functional basic Reasoning: Impaired Reasoning Impairment: Functional basic;Verbal basic Sequencing: Impaired Sequencing  Impairment: Verbal basic;Functional basic Organizing: Impaired Organizing Impairment: Verbal basic;Functional basic Decision Making Impairment: Verbal basic;Functional basic Initiating: Impaired Initiating Impairment: Verbal basic;Functional basic Self Monitoring: Impaired Self Monitoring Impairment: Verbal basic;Functional basic Self Correcting: Appears intact Safety/Judgment: Impaired Sensation Sensation Light Touch: Appears Intact Hot/Cold: Appears Intact Proprioception: Appears Intact Stereognosis: Appears Intact Additional Comments: sensation appears intact Coordination Gross Motor Movements are Fluid and Coordinated: No Fine Motor Movements are  Fluid and Coordinated: No Coordination and Movement Description: LE coordination limited by gross weakness and pain  Motor  Motor Motor: Within Functional Limits Motor - Discharge Observations: generalized weakness Mobility  Bed Mobility Bed Mobility: Rolling Right;Rolling Left;Supine to Sit;Sit to Supine Rolling Right: Minimal Assistance - Patient > 75% Rolling Left: Minimal Assistance - Patient > 75% Supine to Sit: Minimal Assistance - Patient > 75% Sit to Supine: Minimal Assistance - Patient > 75% Transfers Sit to Stand: Minimal Assistance - Patient > 75% Stand to Sit: Minimal Assistance - Patient > 75%  Trunk/Postural Assessment  Cervical Assessment Cervical Assessment: Exceptions to WFL(forward head) Thoracic Assessment Thoracic Assessment: Exceptions to WFL(rounded shoulders) Lumbar Assessment Lumbar Assessment: Exceptions to WFL(posterior pelvic tilt) Postural Control Postural Control: Within Functional Limits  Balance Balance Balance Assessed: Yes Static Sitting Balance Static Sitting - Balance Support: Right upper extremity supported;Left upper extremity supported;Feet supported Static Sitting - Level of Assistance: 5: Stand by assistance Dynamic Sitting Balance Dynamic Sitting - Balance Support: Right upper  extremity supported;Left upper extremity supported;Feet supported Dynamic Sitting - Level of Assistance: 5: Stand by assistance Static Standing Balance Static Standing - Balance Support: Left upper extremity supported;Right upper extremity supported;During functional activity Static Standing - Level of Assistance: 4: Min assist Dynamic Standing Balance Dynamic Standing - Balance Support: Left upper extremity supported;Right upper extremity supported;During functional activity Dynamic Standing - Level of Assistance: 4: Min assist Extremity/Trunk Assessment RUE Assessment RUE Assessment: Within Functional Limits LUE Assessment LUE Assessment: Within Functional Limits   Curtis Sites 03/28/2019, 3:15 PM

## 2019-03-28 NOTE — Progress Notes (Signed)
Occupational Therapy Session Note  Patient Details  Name: Tamara Gregory MRN: [id number] Date of Birth: [birth year]  Today's Date: 03/28/2019 OT Individual Time: 1000-1045 OT Individual Time Calculation (min): 45 min    Short Term Goals: Week 2:  OT Short Term Goal 1 (Week 2): Patient will recall 3/3 THPs independently OT Short Term Goal 2 (Week 2): Patient will complete stand pivot transfer to toilet/wheelchair with RW with min assist  Skilled Therapeutic Interventions/Progress Updates:    Pt received supine in bed c/o pain in her hip, 5/10 with no request for intervention. Pt required encouragement to participate and complete morning ADLs. Pt required min A to transition from supine to sitting EOB, including management of LLE. Pt's BP obtained- 101/61 d/t pt saying she felt dizzy. Symptoms resolved on their own without further intervention. Pt unable to recall hip precautions, edu provided. Pt completed sit > stand with min A and used RW to complete functional mobility to sink. Pt completed UB bathing and dressing seated with set up assist. Min A to complete LB dressing and bathing. Max A to don socks. Pt requested to go back to bed and she did so with min A support of LLE. Pt left supine with all needs met, bed alarm set. Discussed d/c planning with pt throughout session.   Therapy Documentation Precautions:  Precautions Precautions: Posterior Hip Precaution Comments: pt able to recall 1/3 precautions at CIR eval  Restrictions Weight Bearing Restrictions: Yes LLE Weight Bearing: Weight bearing as tolerated   Therapy/Group: Individual Therapy  Curtis Sites 03/28/2019, 11:29 AM

## 2019-03-28 NOTE — Discharge Summary (Addendum)
Physician Discharge Summary  Patient ID: Tamara Gregory MRN: [id number] DOB/AGE: Oct 05, 1925 85 y.o.  Admit date: 03/15/2019 Discharge date: 03/29/2019  Discharge Diagnoses:  Active Problems:   Left displaced femoral neck fracture (HCC)   Leukocytosis   E. coli UTI   Hypoalbuminemia due to protein-calorie malnutrition (HCC)   Essential hypertension   Ocular myasthenia gravis (HCC)   Acute blood loss anemia   Steroid-induced hyperglycemia   Labile blood pressure   Labile blood glucose   Tachycardia   Postoperative pain   Discharged Condition: Stable  Significant Diagnostic Studies: Ct Head Wo Contrast  Result Date: 03/10/2019 CLINICAL DATA:  Fall 3 days prior.  Headache. EXAM: CT HEAD WITHOUT CONTRAST TECHNIQUE: Contiguous axial images were obtained from the base of the skull through the vertex without intravenous contrast. COMPARISON:  None. FINDINGS: Brain: There is mild diffuse atrophy. There is no intracranial mass, hemorrhage, extra-axial fluid collection, or midline shift. There is patchy small vessel disease in the centra semiovale bilaterally. There is mild small vessel disease in the thalamic regions. No acute appearing infarct evident on this study. Vascular: There is no appreciable hyperdense vessel. There is calcification in each carotid siphon region. Skull: The bony calvarium appears intact. Sinuses/Orbits: There is slight mucosal thickening in each maxillary antrum. There is mild mucosal thickening in several ethmoid air cells. Orbits appear symmetric bilaterally. Other: Mastoid air cells are clear. There is debris in each external auditory canal. IMPRESSION: Mild atrophy with mild patchy periventricular small vessel disease. No acute infarct. No mass or hemorrhage. Foci of arterial vascular calcification noted. Mucosal thickening noted in several ethmoid air cells. Probable cerumen in each external auditory canal. Electronically Signed   By: Lowella Grip III M.D.   On:  03/10/2019 14:24   Pelvis Portable  Result Date: 03/11/2019 CLINICAL DATA:  Left hip hemiarthroplasty EXAM: PORTABLE PELVIS 1-2 VIEWS COMPARISON:  None. FINDINGS: Status post hemiarthroplasty of the left hip with expected postoperative gas in the soft tissues. Alignment is normal. No periprosthetic fracture. Moderate right hip osteoarthrosis. IMPRESSION: Expected postoperative appearance following left hip hemiarthroplasty. Electronically Signed   By: Ulyses Jarred M.D.   On: 03/11/2019 14:57   Ct Hip Left Wo Contrast  Result Date: 03/10/2019 CLINICAL DATA:  Left hip pain since a fall 3 days ago. Initial encounter. EXAM: CT OF THE LEFT HIP WITHOUT CONTRAST TECHNIQUE: Multidetector CT imaging of the left hip was performed according to the standard protocol. Multiplanar CT image reconstructions were also generated. COMPARISON:  Plain films left hip this same day. FINDINGS: Bones/Joint/Cartilage The patient has an acute subcapital fracture of the left hip. There is impaction posteriorly approximately 1.5 cm and anterior displacement of the femoral neck of 1 cm. No other fracture is identified. No focal bony lesion. Ligaments Suboptimally assessed by CT. Muscles and Tendons Intact. Soft tissues Imaged intrapelvic contents demonstrate some sigmoid diverticulosis. The patient is status post hysterectomy. IMPRESSION: Acute subcapital fracture of the left hip demonstrates mild impaction and anterior displacement of femoral neck. Electronically Signed   By: Inge Rise M.D.   On: 03/10/2019 14:50   Dg Hip Unilat W Or Wo Pelvis 2-3 Views Left  Result Date: 03/10/2019 CLINICAL DATA:  Left hip pain.  No known injury. EXAM: DG HIP (WITH OR WITHOUT PELVIS) 2-3V LEFT COMPARISON:  None. FINDINGS: There is no acute bony or joint abnormality. No avascular necrosis of the femoral heads or focal lesion. Moderate to moderately severe degenerative disease about the hips appears worse on the  right. Soft tissues are  unremarkable. Spinal stimulator is noted. IMPRESSION: No acute finding. Moderate to moderately severe bilateral hip osteoarthritis appears worse on the right. Electronically Signed   By: Inge Rise M.D.   On: 03/10/2019 11:54   Vas [place] Lower Extremity Venous (dvt)  Result Date: 03/16/2019  Lower Venous Study Indications: Swelling.  Performing Technologist: Abram Sander RVS  Examination Guidelines: A complete evaluation includes B-mode imaging, spectral Doppler, color Doppler, and power Doppler as needed of all accessible portions of each vessel. Bilateral testing is considered an integral part of a complete examination. Limited examinations for reoccurring indications may be performed as noted.  +---------+---------------+---------+-----------+----------+--------------+ RIGHT    CompressibilityPhasicitySpontaneityPropertiesSummary        +---------+---------------+---------+-----------+----------+--------------+ CFV      Full           Yes      Yes                                 +---------+---------------+---------+-----------+----------+--------------+ SFJ      Full                                                        +---------+---------------+---------+-----------+----------+--------------+ FV Prox  Full                                                        +---------+---------------+---------+-----------+----------+--------------+ FV Mid   Full                                                        +---------+---------------+---------+-----------+----------+--------------+ FV DistalFull                                                        +---------+---------------+---------+-----------+----------+--------------+ PFV      Full                                                        +---------+---------------+---------+-----------+----------+--------------+ POP      Full           Yes      Yes                                  +---------+---------------+---------+-----------+----------+--------------+ PTV      Full                                                        +---------+---------------+---------+-----------+----------+--------------+  PERO                                                  Not visualized +---------+---------------+---------+-----------+----------+--------------+   +---------+---------------+---------+-----------+----------+--------------+ LEFT     CompressibilityPhasicitySpontaneityPropertiesSummary        +---------+---------------+---------+-----------+----------+--------------+ CFV      Full           Yes      Yes                                 +---------+---------------+---------+-----------+----------+--------------+ SFJ      Full                                                        +---------+---------------+---------+-----------+----------+--------------+ FV Prox  Full                                                        +---------+---------------+---------+-----------+----------+--------------+ FV Mid   Full                                                        +---------+---------------+---------+-----------+----------+--------------+ FV DistalFull                                                        +---------+---------------+---------+-----------+----------+--------------+ PFV      Full                                                        +---------+---------------+---------+-----------+----------+--------------+ POP      Full           Yes      Yes                                 +---------+---------------+---------+-----------+----------+--------------+ PTV                                                   Not visualized +---------+---------------+---------+-----------+----------+--------------+ PERO                                                  Not visualized  +---------+---------------+---------+-----------+----------+--------------+  Summary: Right: There is no evidence of deep vein thrombosis in the lower extremity. However, portions of this examination were limited- see technologist comments above. No cystic structure found in the popliteal fossa. Left: There is no evidence of deep vein thrombosis in the lower extremity. However, portions of this examination were limited- see technologist comments above. No cystic structure found in the popliteal fossa.  *See table(s) above for measurements and observations. Electronically signed by Deitra Mayo MD on 03/16/2019 at 3:20:17 PM.    Final     Labs:  Basic Metabolic Panel: Recent Labs  Lab 03/24/19 0533 03/26/19 0551  NA 139 139  K 3.0* 4.0  CL 102 105  CO2 26 22  GLUCOSE 107* 108*  BUN 18 21  CREATININE 0.64 0.78  CALCIUM 9.2 9.1    CBC: Recent Labs  Lab 03/26/19 0551  WBC 11.1*  NEUTROABS 7.0  HGB 8.8*  HCT 29.0*  MCV 94.5  PLT 326    CBG: No results for input(s): GLUCAP in the last 168 hours.   Family history Father with CAD and diabetes, sister with breast cancer, brother with lung cancer as well as sister with liver cancer.  Negative for stomach cancer or rectal cancer  Brief HPI:   Tamara Gregory is a 85 year old right-handed female with history of colon cancer with resection, chronic back pain followed by Dr. Maryjean Ka with spinal cord stimulator, hypertension, hyperlipidemia, ocular myasthenia gravis.  Per chart review lives with spouse used a wheelchair and walker prior to admission.  One level home.  Presented for 07/22/2019 with fall x2 without loss of consciousness.  Reports of left hip pain.  CT of the hip showed acute subcapital fracture left hip mild impaction and anterior displacement of femoral neck.  Cranial CT was negative.  Underwent left hip bipolar hemiarthroplasty 03/11/2019 per Dr. Percell Miller.  Weightbearing as tolerated with posterior hip precautions.  Hospital course  pain management.  Acute blood loss anemia 8.7.  Subcutaneous Lovenox for DVT prophylaxis.  E. coli UTI completing a course of Keflex.  Therapy evaluations completed patient was admitted for a comprehensive rehab program  Hospital Course: Tamara Gregory was admitted to rehab 03/15/2019 for inpatient therapies to consist of PT, ST and OT at least three hours five days a week. Past admission physiatrist, therapy team and rehab RN have worked together to provide customized collaborative inpatient rehab.  Pertaining to patient left displaced femoral neck fracture she had undergone hip hemiarthroplasty 03/11/2019 and would follow-up with orthopedic services.  Weightbearing as tolerated with posterior hip precautions.  Subcutaneous Lovenox for DVT prophylaxis vascular studies negative.  Pain management use of hydrocodone as needed.  Acute blood loss anemia 8.8 stable no bleeding episodes.  Patient with a history of ocular myasthenia gravis she remained on chronic prednisone therapy.  Blood pressure is controlled on Cozaar as well as low-dose HCTZ.  She would follow-up with her primary MD.  Crestor ongoing for hyperlipidemia.  Physical exam.  Blood pressure 154/83 pulse 68 temperature 99 respirations 14 oxygen saturations 95% room air Constitutional.  Alert and oriented no distress HEENT normocephalic and atraumatic Eyes.  Pupils round and reactive to light without nystagmus Neck.  Supple nontender.  Normal range of motion no thyromegaly without tracheal deviation Cardiovascular.  Normal rate and rhythm without murmur rub Respiratory.  No respiratory distress without wheezes GI.  Soft exhibits no distention nontender without rebound Musculoskeletal normal range of motion Neurological.  Alert and oriented upper extremities 5 out of 5 right lower  extremity 4-5 proximal to distal left lower extremity 2+ hip flexors, knee extension and 4 out of 5 ankle dorsi plantarflexion. Skin.  Left hip incision clean and dry  multiple bruises noted  Rehab course: During patient's stay in rehab weekly team conferences were held to monitor patient's progress, set goals and discuss barriers to discharge. At admission, patient required max assist sit to stand, moderate assist side-lying to sitting.  Mod to max assist for general transfers.  Minimal assist upper body bathing max assist lower body bathing minimal assist upper body dressing max assist lower body dressing max assist for clothing manipulation and hygiene  She  has had improvement in activity tolerance, balance, postural control as well as ability to compensate for deficits. He/She has had improvement in functional use RUE/LUE  and RLE/LLE as well as improvement in awareness.  Patient remained cooperative with therapies tolerated ambulation training 10 feet rolling walker minimal guard working with energy conservation techniques.  Minimal assist for boosting in the chair.  Transfers bed to wheelchair and wheelchair to toileting with minimal assistance required some encouragement at times.  Completed toilet transfers stand pivot holding to grab bars.  Full family teaching completed plan discharge to home       Disposition: Discharge to home   Diet: Regular  Special Instructions: Weightbearing as tolerated with posterior hip precautions  Medications at discharge. 1.  Tylenol as needed 2.  Alphagan ophthalmic solution 1 drop both eyes 3 times daily 3.  Aspirin 81 mg one half tablet daily 4.  Hydrocodone 5-325 mg 1 tablet every 6 hours as needed pain 5.  Xalatan ophthalmic solution 1 drop left eye at bedtime 6.  Hyzaar 1 tablet daily 7.  Protonix 40 mg p.o. daily 8.  MiraLAX twice daily hold for loose stools 9.  Prednisone 10 mg p.o. daily 10.  Crestor 5 mg p.o. daily 11.  Senokot 1 tablet p.o. twice daily 12.Zoloft 150 mg daily at bedtime  Discharge Instructions    Ambulatory referral to Physical Medicine Rehab   Complete by:  As directed    Moderate  complexity follow-up one month debility related to left femoral neck fracture      Follow-up Information    Jamse Arn, MD Follow up.   Specialty:  Physical Medicine and Rehabilitation Why:  as directed Contact information: [address] [place] [address] [phone]        Renette Butters, MD Follow up.   Specialty:  Orthopedic Surgery Why:  call for appointment Contact information: [address] [place] [address] [phone]        Nahser, Wonda Cheng, MD Follow up.   Specialty:  Cardiology Why:  as needed Contact information: [place] [address] [phone]        Narda Amber K, DO Follow up.   Specialty:  Neurology Why:  as directed Contact information: [place] [address] [phone]        Binnie Rail, MD Follow up.   Specialty:  Internal Medicine Contact information: [place] [address] [phone]           Signed: Cathlyn Parsons 03/28/2019, 9:44 AM Patient seen and examined by me on day of discharge. Delice Lesch, MD, ABPMR

## 2019-03-29 MED ORDER — ACETAMINOPHEN 325 MG PO TABS: 650.0000 mg | ORAL_TABLET | Freq: Four times a day (QID) | ORAL | Status: AC | PRN

## 2019-03-29 MED ORDER — PANTOPRAZOLE SODIUM 40 MG PO TBEC: 40.0000 mg | DELAYED_RELEASE_TABLET | Freq: Every day | ORAL | 0 refills | Status: DC

## 2019-03-29 MED ORDER — HYDROCODONE-ACETAMINOPHEN 5-325 MG PO TABS: 1.0000 | ORAL_TABLET | Freq: Four times a day (QID) | ORAL | 0 refills | Status: DC | PRN

## 2019-03-29 MED ORDER — POLYETHYLENE GLYCOL 3350 17 G PO PACK: 17.0000 g | PACK | Freq: Two times a day (BID) | ORAL | 0 refills | Status: DC

## 2019-03-29 NOTE — Plan of Care (Signed)
  Problem: Consults Goal: RH GENERAL PATIENT EDUCATION Description See Patient Education module for education specifics. Outcome: Completed/Met Goal: Skin Care Protocol Initiated - if Braden Score 18 or less Description If consults are not indicated, leave blank or document N/A Outcome: Completed/Met Goal: Nutrition Consult-if indicated Outcome: Completed/Met Goal: Diabetes Guidelines if Diabetic/Glucose > 140 Description If diabetic or lab glucose is > 140 mg/dl - Initiate Diabetes/Hyperglycemia Guidelines & Document Interventions  Outcome: Completed/Met   Problem: RH BOWEL ELIMINATION Goal: RH STG MANAGE BOWEL WITH ASSISTANCE Description STG Manage Bowel with Min Assistance.  Outcome: Completed/Met Goal: RH STG MANAGE BOWEL W/MEDICATION W/ASSISTANCE Description STG Manage Bowel with Medication with Assistance. Outcome: Completed/Met   Problem: RH BLADDER ELIMINATION Goal: RH STG MANAGE BLADDER WITH ASSISTANCE Description STG Manage Bladder With Min Assistance  Outcome: Completed/Met Goal: RH STG MANAGE BLADDER WITH EQUIPMENT WITH ASSISTANCE Description STG Manage Bladder With Equipment With Min  Assistance  Outcome: Completed/Met   Problem: RH SKIN INTEGRITY Goal: RH STG SKIN FREE OF INFECTION/BREAKDOWN Description Patient will be free from skin breakdown during admission  Outcome: Completed/Met Goal: RH STG MAINTAIN SKIN INTEGRITY WITH ASSISTANCE Description STG Maintain Skin Integrity With  [place].  Outcome: Completed/Met Goal: RH STG ABLE TO PERFORM INCISION/WOUND CARE W/ASSISTANCE Description STG Able To Perform Incision/Wound Care With [employer].  Outcome: Completed/Met   Problem: RH SAFETY Goal: RH STG ADHERE TO SAFETY PRECAUTIONS W/ASSISTANCE/DEVICE Description STG Adhere to Safety Precautions With Min Assistance/Device.  Outcome: Completed/Met   Problem: RH PAIN MANAGEMENT Goal: RH STG PAIN MANAGED AT OR BELOW PT'S PAIN  GOAL Description Patient will be pain free or pain less than 3 during admission  Outcome: Completed/Met   Problem: RH KNOWLEDGE DEFICIT GENERAL Goal: RH STG INCREASE KNOWLEDGE OF SELF CARE AFTER HOSPITALIZATION Outcome: Completed/Met

## 2019-03-29 NOTE — Progress Notes (Signed)
Social Work  Discharge Note  The overall goal for the admission was met for:   Discharge location: [facility] Length of Stay: Yes-14 DAYS  Discharge activity level: Yes-MIN ASSIST LEVEL  Home/community participation: Yes  Services provided included: MD, RD, PT, OT, RN, CM, Pharmacy and SW  Financial Services: Private Insurance: [facility]  Follow-up services arranged: Home Health: [facility], DME: [facility] IN 1 and Patient/Family request agency HH: [facility], DME: NO PREF  Comments (or additional information):HUSBAND AND SON WERE HERE FOR FAMILY EDUCATION ON Friday AND IT WENT WELL. AWARE PT WILL REQUIRE 24 HR CARE. PLAN TO HIRE PRIVATE DUTY FROM 9A-5P TO ASSIST HUSBAND WITH PT'S CARE. SET FOR DC  Patient/Family verbalized understanding of follow-up arrangements: Yes  Individual responsible for coordination of the follow-up plan: RAY-HUSBAND AND KURT-SON  Confirmed correct DME delivered: Elease Hashimoto 03/29/2019    Elease Hashimoto

## 2019-03-30 ENCOUNTER — Telehealth: Payer: Self-pay | Admitting: Internal Medicine

## 2019-03-30 NOTE — Telephone Encounter (Signed)
Notified Rhonda w/MD response.Marland KitchenJohny Chess

## 2019-03-30 NOTE — Telephone Encounter (Signed)
Copied from [place] [id number]. Topic: Quick Communication - Home Health Verbal Orders >> Mar 30, 2019  9:31 AM Valla Leaver wrote: Caller/Agency: Suanne Marker, Clinical Manager w/ [facility] Number: [phone] Requesting OT/PT/Skilled Nursing/Social Work/Speech Therapy: PT AND OT Frequency: eval for both, hopefully today

## 2019-03-30 NOTE — Telephone Encounter (Signed)
ok 

## 2019-03-31 DIAGNOSIS — M48062 Spinal stenosis, lumbar region with neurogenic claudication: Secondary | ICD-10-CM | POA: Diagnosis not present

## 2019-03-31 DIAGNOSIS — Z7952 Long term (current) use of systemic steroids: Secondary | ICD-10-CM | POA: Diagnosis not present

## 2019-03-31 DIAGNOSIS — Z7982 Long term (current) use of aspirin: Secondary | ICD-10-CM | POA: Diagnosis not present

## 2019-03-31 DIAGNOSIS — M25562 Pain in left knee: Secondary | ICD-10-CM | POA: Diagnosis not present

## 2019-03-31 DIAGNOSIS — I491 Atrial premature depolarization: Secondary | ICD-10-CM | POA: Diagnosis not present

## 2019-03-31 DIAGNOSIS — M25561 Pain in right knee: Secondary | ICD-10-CM | POA: Diagnosis not present

## 2019-03-31 DIAGNOSIS — M1990 Unspecified osteoarthritis, unspecified site: Secondary | ICD-10-CM | POA: Diagnosis not present

## 2019-03-31 DIAGNOSIS — I119 Hypertensive heart disease without heart failure: Secondary | ICD-10-CM | POA: Diagnosis not present

## 2019-03-31 DIAGNOSIS — Z9181 History of falling: Secondary | ICD-10-CM | POA: Diagnosis not present

## 2019-04-02 DIAGNOSIS — S72012D Unspecified intracapsular fracture of left femur, subsequent encounter for closed fracture with routine healing: Secondary | ICD-10-CM | POA: Diagnosis not present

## 2019-04-02 DIAGNOSIS — I491 Atrial premature depolarization: Secondary | ICD-10-CM | POA: Diagnosis not present

## 2019-04-02 DIAGNOSIS — I119 Hypertensive heart disease without heart failure: Secondary | ICD-10-CM | POA: Diagnosis not present

## 2019-04-02 DIAGNOSIS — Z87440 Personal history of urinary (tract) infections: Secondary | ICD-10-CM | POA: Diagnosis not present

## 2019-04-02 DIAGNOSIS — Z7982 Long term (current) use of aspirin: Secondary | ICD-10-CM | POA: Diagnosis not present

## 2019-04-02 DIAGNOSIS — R296 Repeated falls: Secondary | ICD-10-CM | POA: Diagnosis not present

## 2019-04-02 DIAGNOSIS — W19XXXD Unspecified fall, subsequent encounter: Secondary | ICD-10-CM | POA: Diagnosis not present

## 2019-04-02 DIAGNOSIS — G7000 Myasthenia gravis without (acute) exacerbation: Secondary | ICD-10-CM | POA: Diagnosis not present

## 2019-04-02 DIAGNOSIS — Z9682 Presence of neurostimulator: Secondary | ICD-10-CM | POA: Diagnosis not present

## 2019-04-02 DIAGNOSIS — M25561 Pain in right knee: Secondary | ICD-10-CM | POA: Diagnosis not present

## 2019-04-02 DIAGNOSIS — M25562 Pain in left knee: Secondary | ICD-10-CM | POA: Diagnosis not present

## 2019-04-02 DIAGNOSIS — M1990 Unspecified osteoarthritis, unspecified site: Secondary | ICD-10-CM | POA: Diagnosis not present

## 2019-04-02 DIAGNOSIS — Z7952 Long term (current) use of systemic steroids: Secondary | ICD-10-CM | POA: Diagnosis not present

## 2019-04-02 DIAGNOSIS — Z9181 History of falling: Secondary | ICD-10-CM | POA: Diagnosis not present

## 2019-04-02 DIAGNOSIS — M48062 Spinal stenosis, lumbar region with neurogenic claudication: Secondary | ICD-10-CM | POA: Diagnosis not present

## 2019-04-02 NOTE — Telephone Encounter (Signed)
2x for 4wks with possibility of pre certification

## 2019-04-05 ENCOUNTER — Telehealth: Payer: Self-pay | Admitting: Internal Medicine

## 2019-04-05 DIAGNOSIS — Z9181 History of falling: Secondary | ICD-10-CM | POA: Diagnosis not present

## 2019-04-05 DIAGNOSIS — S72012D Unspecified intracapsular fracture of left femur, subsequent encounter for closed fracture with routine healing: Secondary | ICD-10-CM | POA: Diagnosis not present

## 2019-04-05 DIAGNOSIS — I491 Atrial premature depolarization: Secondary | ICD-10-CM | POA: Diagnosis not present

## 2019-04-05 DIAGNOSIS — G7000 Myasthenia gravis without (acute) exacerbation: Secondary | ICD-10-CM | POA: Diagnosis not present

## 2019-04-05 DIAGNOSIS — M25562 Pain in left knee: Secondary | ICD-10-CM | POA: Diagnosis not present

## 2019-04-05 DIAGNOSIS — I119 Hypertensive heart disease without heart failure: Secondary | ICD-10-CM | POA: Diagnosis not present

## 2019-04-05 DIAGNOSIS — Z7952 Long term (current) use of systemic steroids: Secondary | ICD-10-CM | POA: Diagnosis not present

## 2019-04-05 DIAGNOSIS — W19XXXD Unspecified fall, subsequent encounter: Secondary | ICD-10-CM | POA: Diagnosis not present

## 2019-04-05 DIAGNOSIS — Z87440 Personal history of urinary (tract) infections: Secondary | ICD-10-CM | POA: Diagnosis not present

## 2019-04-05 DIAGNOSIS — M25561 Pain in right knee: Secondary | ICD-10-CM | POA: Diagnosis not present

## 2019-04-05 DIAGNOSIS — R296 Repeated falls: Secondary | ICD-10-CM | POA: Diagnosis not present

## 2019-04-05 DIAGNOSIS — M48062 Spinal stenosis, lumbar region with neurogenic claudication: Secondary | ICD-10-CM | POA: Diagnosis not present

## 2019-04-05 DIAGNOSIS — Z7982 Long term (current) use of aspirin: Secondary | ICD-10-CM | POA: Diagnosis not present

## 2019-04-05 DIAGNOSIS — Z9682 Presence of neurostimulator: Secondary | ICD-10-CM | POA: Diagnosis not present

## 2019-04-05 DIAGNOSIS — M1990 Unspecified osteoarthritis, unspecified site: Secondary | ICD-10-CM | POA: Diagnosis not present

## 2019-04-05 NOTE — Telephone Encounter (Signed)
Copied from [place] [id number]. Topic: Quick Communication - Home Health Verbal Orders >> Apr 05, 2019  8:04 AM Rayann Heman wrote: Caller/Agency: [facility]  Callback Number: [phone] Requesting OT  Frequency: 1x1 2x4

## 2019-04-05 NOTE — Telephone Encounter (Signed)
Gave verbal orders per Dr. Burns 

## 2019-04-07 DIAGNOSIS — M48062 Spinal stenosis, lumbar region with neurogenic claudication: Secondary | ICD-10-CM | POA: Diagnosis not present

## 2019-04-07 DIAGNOSIS — G7000 Myasthenia gravis without (acute) exacerbation: Secondary | ICD-10-CM | POA: Diagnosis not present

## 2019-04-07 DIAGNOSIS — M1990 Unspecified osteoarthritis, unspecified site: Secondary | ICD-10-CM | POA: Diagnosis not present

## 2019-04-07 DIAGNOSIS — Z7982 Long term (current) use of aspirin: Secondary | ICD-10-CM | POA: Diagnosis not present

## 2019-04-07 DIAGNOSIS — M25561 Pain in right knee: Secondary | ICD-10-CM | POA: Diagnosis not present

## 2019-04-07 DIAGNOSIS — Z9181 History of falling: Secondary | ICD-10-CM | POA: Diagnosis not present

## 2019-04-07 DIAGNOSIS — Z7952 Long term (current) use of systemic steroids: Secondary | ICD-10-CM | POA: Diagnosis not present

## 2019-04-07 DIAGNOSIS — I491 Atrial premature depolarization: Secondary | ICD-10-CM | POA: Diagnosis not present

## 2019-04-07 DIAGNOSIS — M25562 Pain in left knee: Secondary | ICD-10-CM | POA: Diagnosis not present

## 2019-04-07 DIAGNOSIS — Z87440 Personal history of urinary (tract) infections: Secondary | ICD-10-CM | POA: Diagnosis not present

## 2019-04-07 DIAGNOSIS — S72002D Fracture of unspecified part of neck of left femur, subsequent encounter for closed fracture with routine healing: Secondary | ICD-10-CM | POA: Diagnosis not present

## 2019-04-07 DIAGNOSIS — S72012D Unspecified intracapsular fracture of left femur, subsequent encounter for closed fracture with routine healing: Secondary | ICD-10-CM | POA: Diagnosis not present

## 2019-04-07 DIAGNOSIS — Z9682 Presence of neurostimulator: Secondary | ICD-10-CM | POA: Diagnosis not present

## 2019-04-07 DIAGNOSIS — W19XXXD Unspecified fall, subsequent encounter: Secondary | ICD-10-CM | POA: Diagnosis not present

## 2019-04-07 DIAGNOSIS — R296 Repeated falls: Secondary | ICD-10-CM | POA: Diagnosis not present

## 2019-04-07 DIAGNOSIS — I119 Hypertensive heart disease without heart failure: Secondary | ICD-10-CM | POA: Diagnosis not present

## 2019-04-08 ENCOUNTER — Encounter: Payer: Self-pay | Admitting: Gastroenterology

## 2019-04-09 ENCOUNTER — Telehealth: Payer: Self-pay | Admitting: Internal Medicine

## 2019-04-09 DIAGNOSIS — M1990 Unspecified osteoarthritis, unspecified site: Secondary | ICD-10-CM | POA: Diagnosis not present

## 2019-04-09 DIAGNOSIS — I491 Atrial premature depolarization: Secondary | ICD-10-CM | POA: Diagnosis not present

## 2019-04-09 DIAGNOSIS — W19XXXD Unspecified fall, subsequent encounter: Secondary | ICD-10-CM | POA: Diagnosis not present

## 2019-04-09 DIAGNOSIS — M25562 Pain in left knee: Secondary | ICD-10-CM | POA: Diagnosis not present

## 2019-04-09 DIAGNOSIS — M25561 Pain in right knee: Secondary | ICD-10-CM | POA: Diagnosis not present

## 2019-04-09 DIAGNOSIS — Z7952 Long term (current) use of systemic steroids: Secondary | ICD-10-CM | POA: Diagnosis not present

## 2019-04-09 DIAGNOSIS — Z87440 Personal history of urinary (tract) infections: Secondary | ICD-10-CM | POA: Diagnosis not present

## 2019-04-09 DIAGNOSIS — S72012D Unspecified intracapsular fracture of left femur, subsequent encounter for closed fracture with routine healing: Secondary | ICD-10-CM | POA: Diagnosis not present

## 2019-04-09 DIAGNOSIS — Z9181 History of falling: Secondary | ICD-10-CM | POA: Diagnosis not present

## 2019-04-09 DIAGNOSIS — M48062 Spinal stenosis, lumbar region with neurogenic claudication: Secondary | ICD-10-CM | POA: Diagnosis not present

## 2019-04-09 DIAGNOSIS — I119 Hypertensive heart disease without heart failure: Secondary | ICD-10-CM | POA: Diagnosis not present

## 2019-04-09 DIAGNOSIS — Z7982 Long term (current) use of aspirin: Secondary | ICD-10-CM | POA: Diagnosis not present

## 2019-04-09 DIAGNOSIS — Z9682 Presence of neurostimulator: Secondary | ICD-10-CM | POA: Diagnosis not present

## 2019-04-09 DIAGNOSIS — R296 Repeated falls: Secondary | ICD-10-CM | POA: Diagnosis not present

## 2019-04-09 DIAGNOSIS — G7000 Myasthenia gravis without (acute) exacerbation: Secondary | ICD-10-CM | POA: Diagnosis not present

## 2019-04-09 NOTE — Telephone Encounter (Signed)
Copied from [place] [phone]. Topic: Quick Communication - Home Health Verbal Orders >> Apr 09, 2019  4:44 PM Yvette Rack wrote: Caller/Agency: Liji with [facility] Number: [phone] Requesting OT/PT/Skilled Nursing/Social Work/Speech Therapy: PT Frequency: Liji stated pt missed the appt yesterday and would like to move the missed visit to the end of the episode.

## 2019-04-12 DIAGNOSIS — S72012D Unspecified intracapsular fracture of left femur, subsequent encounter for closed fracture with routine healing: Secondary | ICD-10-CM | POA: Diagnosis not present

## 2019-04-12 DIAGNOSIS — Z87440 Personal history of urinary (tract) infections: Secondary | ICD-10-CM | POA: Diagnosis not present

## 2019-04-12 DIAGNOSIS — I119 Hypertensive heart disease without heart failure: Secondary | ICD-10-CM | POA: Diagnosis not present

## 2019-04-12 DIAGNOSIS — G7000 Myasthenia gravis without (acute) exacerbation: Secondary | ICD-10-CM | POA: Diagnosis not present

## 2019-04-12 DIAGNOSIS — M25561 Pain in right knee: Secondary | ICD-10-CM | POA: Diagnosis not present

## 2019-04-12 DIAGNOSIS — M48062 Spinal stenosis, lumbar region with neurogenic claudication: Secondary | ICD-10-CM | POA: Diagnosis not present

## 2019-04-12 DIAGNOSIS — M25562 Pain in left knee: Secondary | ICD-10-CM | POA: Diagnosis not present

## 2019-04-12 DIAGNOSIS — Z7982 Long term (current) use of aspirin: Secondary | ICD-10-CM | POA: Diagnosis not present

## 2019-04-12 DIAGNOSIS — Z7952 Long term (current) use of systemic steroids: Secondary | ICD-10-CM | POA: Diagnosis not present

## 2019-04-12 DIAGNOSIS — W19XXXD Unspecified fall, subsequent encounter: Secondary | ICD-10-CM | POA: Diagnosis not present

## 2019-04-12 DIAGNOSIS — R296 Repeated falls: Secondary | ICD-10-CM | POA: Diagnosis not present

## 2019-04-12 DIAGNOSIS — Z9682 Presence of neurostimulator: Secondary | ICD-10-CM | POA: Diagnosis not present

## 2019-04-12 DIAGNOSIS — Z9181 History of falling: Secondary | ICD-10-CM | POA: Diagnosis not present

## 2019-04-12 DIAGNOSIS — I491 Atrial premature depolarization: Secondary | ICD-10-CM | POA: Diagnosis not present

## 2019-04-12 DIAGNOSIS — M1990 Unspecified osteoarthritis, unspecified site: Secondary | ICD-10-CM | POA: Diagnosis not present

## 2019-04-12 NOTE — Telephone Encounter (Signed)
ok 

## 2019-04-12 NOTE — Telephone Encounter (Signed)
Gave ok for orders per Dr. Burns.  

## 2019-04-13 DIAGNOSIS — I491 Atrial premature depolarization: Secondary | ICD-10-CM | POA: Diagnosis not present

## 2019-04-13 DIAGNOSIS — M1990 Unspecified osteoarthritis, unspecified site: Secondary | ICD-10-CM | POA: Diagnosis not present

## 2019-04-13 DIAGNOSIS — M48062 Spinal stenosis, lumbar region with neurogenic claudication: Secondary | ICD-10-CM | POA: Diagnosis not present

## 2019-04-13 DIAGNOSIS — M25561 Pain in right knee: Secondary | ICD-10-CM | POA: Diagnosis not present

## 2019-04-13 DIAGNOSIS — S72012D Unspecified intracapsular fracture of left femur, subsequent encounter for closed fracture with routine healing: Secondary | ICD-10-CM | POA: Diagnosis not present

## 2019-04-13 DIAGNOSIS — Z7982 Long term (current) use of aspirin: Secondary | ICD-10-CM | POA: Diagnosis not present

## 2019-04-13 DIAGNOSIS — I119 Hypertensive heart disease without heart failure: Secondary | ICD-10-CM | POA: Diagnosis not present

## 2019-04-13 DIAGNOSIS — R296 Repeated falls: Secondary | ICD-10-CM | POA: Diagnosis not present

## 2019-04-13 DIAGNOSIS — Z7952 Long term (current) use of systemic steroids: Secondary | ICD-10-CM | POA: Diagnosis not present

## 2019-04-13 DIAGNOSIS — Z9181 History of falling: Secondary | ICD-10-CM | POA: Diagnosis not present

## 2019-04-13 DIAGNOSIS — W19XXXD Unspecified fall, subsequent encounter: Secondary | ICD-10-CM | POA: Diagnosis not present

## 2019-04-13 DIAGNOSIS — G7000 Myasthenia gravis without (acute) exacerbation: Secondary | ICD-10-CM | POA: Diagnosis not present

## 2019-04-13 DIAGNOSIS — Z9682 Presence of neurostimulator: Secondary | ICD-10-CM | POA: Diagnosis not present

## 2019-04-13 DIAGNOSIS — M25562 Pain in left knee: Secondary | ICD-10-CM | POA: Diagnosis not present

## 2019-04-13 DIAGNOSIS — Z87440 Personal history of urinary (tract) infections: Secondary | ICD-10-CM | POA: Diagnosis not present

## 2019-04-14 DIAGNOSIS — Z9181 History of falling: Secondary | ICD-10-CM | POA: Diagnosis not present

## 2019-04-14 DIAGNOSIS — W19XXXD Unspecified fall, subsequent encounter: Secondary | ICD-10-CM | POA: Diagnosis not present

## 2019-04-14 DIAGNOSIS — I119 Hypertensive heart disease without heart failure: Secondary | ICD-10-CM | POA: Diagnosis not present

## 2019-04-14 DIAGNOSIS — Z7952 Long term (current) use of systemic steroids: Secondary | ICD-10-CM | POA: Diagnosis not present

## 2019-04-14 DIAGNOSIS — G7000 Myasthenia gravis without (acute) exacerbation: Secondary | ICD-10-CM | POA: Diagnosis not present

## 2019-04-14 DIAGNOSIS — Z9682 Presence of neurostimulator: Secondary | ICD-10-CM | POA: Diagnosis not present

## 2019-04-14 DIAGNOSIS — R296 Repeated falls: Secondary | ICD-10-CM | POA: Diagnosis not present

## 2019-04-14 DIAGNOSIS — S72012D Unspecified intracapsular fracture of left femur, subsequent encounter for closed fracture with routine healing: Secondary | ICD-10-CM | POA: Diagnosis not present

## 2019-04-14 DIAGNOSIS — M25562 Pain in left knee: Secondary | ICD-10-CM | POA: Diagnosis not present

## 2019-04-14 DIAGNOSIS — I491 Atrial premature depolarization: Secondary | ICD-10-CM | POA: Diagnosis not present

## 2019-04-14 DIAGNOSIS — M48062 Spinal stenosis, lumbar region with neurogenic claudication: Secondary | ICD-10-CM | POA: Diagnosis not present

## 2019-04-14 DIAGNOSIS — Z87440 Personal history of urinary (tract) infections: Secondary | ICD-10-CM | POA: Diagnosis not present

## 2019-04-14 DIAGNOSIS — Z7982 Long term (current) use of aspirin: Secondary | ICD-10-CM | POA: Diagnosis not present

## 2019-04-14 DIAGNOSIS — M1990 Unspecified osteoarthritis, unspecified site: Secondary | ICD-10-CM | POA: Diagnosis not present

## 2019-04-14 DIAGNOSIS — M25561 Pain in right knee: Secondary | ICD-10-CM | POA: Diagnosis not present

## 2019-04-15 DIAGNOSIS — M1990 Unspecified osteoarthritis, unspecified site: Secondary | ICD-10-CM | POA: Diagnosis not present

## 2019-04-15 DIAGNOSIS — R296 Repeated falls: Secondary | ICD-10-CM | POA: Diagnosis not present

## 2019-04-15 DIAGNOSIS — I119 Hypertensive heart disease without heart failure: Secondary | ICD-10-CM | POA: Diagnosis not present

## 2019-04-15 DIAGNOSIS — M48062 Spinal stenosis, lumbar region with neurogenic claudication: Secondary | ICD-10-CM | POA: Diagnosis not present

## 2019-04-15 DIAGNOSIS — I491 Atrial premature depolarization: Secondary | ICD-10-CM | POA: Diagnosis not present

## 2019-04-15 DIAGNOSIS — Z9682 Presence of neurostimulator: Secondary | ICD-10-CM | POA: Diagnosis not present

## 2019-04-15 DIAGNOSIS — G7000 Myasthenia gravis without (acute) exacerbation: Secondary | ICD-10-CM | POA: Diagnosis not present

## 2019-04-15 DIAGNOSIS — Z7952 Long term (current) use of systemic steroids: Secondary | ICD-10-CM | POA: Diagnosis not present

## 2019-04-15 DIAGNOSIS — M25561 Pain in right knee: Secondary | ICD-10-CM | POA: Diagnosis not present

## 2019-04-15 DIAGNOSIS — M25562 Pain in left knee: Secondary | ICD-10-CM | POA: Diagnosis not present

## 2019-04-15 DIAGNOSIS — S72012D Unspecified intracapsular fracture of left femur, subsequent encounter for closed fracture with routine healing: Secondary | ICD-10-CM | POA: Diagnosis not present

## 2019-04-15 DIAGNOSIS — Z9181 History of falling: Secondary | ICD-10-CM | POA: Diagnosis not present

## 2019-04-15 DIAGNOSIS — W19XXXD Unspecified fall, subsequent encounter: Secondary | ICD-10-CM | POA: Diagnosis not present

## 2019-04-15 DIAGNOSIS — Z7982 Long term (current) use of aspirin: Secondary | ICD-10-CM | POA: Diagnosis not present

## 2019-04-15 DIAGNOSIS — Z87440 Personal history of urinary (tract) infections: Secondary | ICD-10-CM | POA: Diagnosis not present

## 2019-04-16 DIAGNOSIS — C44729 Squamous cell carcinoma of skin of left lower limb, including hip: Secondary | ICD-10-CM | POA: Diagnosis not present

## 2019-04-16 DIAGNOSIS — D485 Neoplasm of uncertain behavior of skin: Secondary | ICD-10-CM | POA: Diagnosis not present

## 2019-04-16 DIAGNOSIS — L570 Actinic keratosis: Secondary | ICD-10-CM | POA: Diagnosis not present

## 2019-04-16 DIAGNOSIS — D692 Other nonthrombocytopenic purpura: Secondary | ICD-10-CM | POA: Diagnosis not present

## 2019-04-16 DIAGNOSIS — L821 Other seborrheic keratosis: Secondary | ICD-10-CM | POA: Diagnosis not present

## 2019-04-19 DIAGNOSIS — M48062 Spinal stenosis, lumbar region with neurogenic claudication: Secondary | ICD-10-CM | POA: Diagnosis not present

## 2019-04-19 DIAGNOSIS — W19XXXD Unspecified fall, subsequent encounter: Secondary | ICD-10-CM | POA: Diagnosis not present

## 2019-04-19 DIAGNOSIS — M25561 Pain in right knee: Secondary | ICD-10-CM | POA: Diagnosis not present

## 2019-04-19 DIAGNOSIS — Z7952 Long term (current) use of systemic steroids: Secondary | ICD-10-CM | POA: Diagnosis not present

## 2019-04-19 DIAGNOSIS — I491 Atrial premature depolarization: Secondary | ICD-10-CM | POA: Diagnosis not present

## 2019-04-19 DIAGNOSIS — I119 Hypertensive heart disease without heart failure: Secondary | ICD-10-CM | POA: Diagnosis not present

## 2019-04-19 DIAGNOSIS — S72012D Unspecified intracapsular fracture of left femur, subsequent encounter for closed fracture with routine healing: Secondary | ICD-10-CM | POA: Diagnosis not present

## 2019-04-19 DIAGNOSIS — M1990 Unspecified osteoarthritis, unspecified site: Secondary | ICD-10-CM | POA: Diagnosis not present

## 2019-04-19 DIAGNOSIS — Z9181 History of falling: Secondary | ICD-10-CM | POA: Diagnosis not present

## 2019-04-19 DIAGNOSIS — M25562 Pain in left knee: Secondary | ICD-10-CM | POA: Diagnosis not present

## 2019-04-19 DIAGNOSIS — Z87440 Personal history of urinary (tract) infections: Secondary | ICD-10-CM | POA: Diagnosis not present

## 2019-04-19 DIAGNOSIS — R296 Repeated falls: Secondary | ICD-10-CM | POA: Diagnosis not present

## 2019-04-19 DIAGNOSIS — G7000 Myasthenia gravis without (acute) exacerbation: Secondary | ICD-10-CM | POA: Diagnosis not present

## 2019-04-19 DIAGNOSIS — Z7982 Long term (current) use of aspirin: Secondary | ICD-10-CM | POA: Diagnosis not present

## 2019-04-19 DIAGNOSIS — Z9682 Presence of neurostimulator: Secondary | ICD-10-CM | POA: Diagnosis not present

## 2019-04-20 DIAGNOSIS — M25561 Pain in right knee: Secondary | ICD-10-CM | POA: Diagnosis not present

## 2019-04-20 DIAGNOSIS — S72012D Unspecified intracapsular fracture of left femur, subsequent encounter for closed fracture with routine healing: Secondary | ICD-10-CM | POA: Diagnosis not present

## 2019-04-20 DIAGNOSIS — M1990 Unspecified osteoarthritis, unspecified site: Secondary | ICD-10-CM | POA: Diagnosis not present

## 2019-04-20 DIAGNOSIS — Z9682 Presence of neurostimulator: Secondary | ICD-10-CM | POA: Diagnosis not present

## 2019-04-20 DIAGNOSIS — M48062 Spinal stenosis, lumbar region with neurogenic claudication: Secondary | ICD-10-CM | POA: Diagnosis not present

## 2019-04-20 DIAGNOSIS — R296 Repeated falls: Secondary | ICD-10-CM | POA: Diagnosis not present

## 2019-04-20 DIAGNOSIS — Z7952 Long term (current) use of systemic steroids: Secondary | ICD-10-CM | POA: Diagnosis not present

## 2019-04-20 DIAGNOSIS — G7000 Myasthenia gravis without (acute) exacerbation: Secondary | ICD-10-CM | POA: Diagnosis not present

## 2019-04-20 DIAGNOSIS — M25562 Pain in left knee: Secondary | ICD-10-CM | POA: Diagnosis not present

## 2019-04-20 DIAGNOSIS — Z7982 Long term (current) use of aspirin: Secondary | ICD-10-CM | POA: Diagnosis not present

## 2019-04-20 DIAGNOSIS — I119 Hypertensive heart disease without heart failure: Secondary | ICD-10-CM | POA: Diagnosis not present

## 2019-04-20 DIAGNOSIS — Z9181 History of falling: Secondary | ICD-10-CM | POA: Diagnosis not present

## 2019-04-20 DIAGNOSIS — I491 Atrial premature depolarization: Secondary | ICD-10-CM | POA: Diagnosis not present

## 2019-04-20 DIAGNOSIS — W19XXXD Unspecified fall, subsequent encounter: Secondary | ICD-10-CM | POA: Diagnosis not present

## 2019-04-20 DIAGNOSIS — Z87440 Personal history of urinary (tract) infections: Secondary | ICD-10-CM | POA: Diagnosis not present

## 2019-04-21 DIAGNOSIS — M25561 Pain in right knee: Secondary | ICD-10-CM | POA: Diagnosis not present

## 2019-04-21 DIAGNOSIS — Z9682 Presence of neurostimulator: Secondary | ICD-10-CM | POA: Diagnosis not present

## 2019-04-21 DIAGNOSIS — W19XXXD Unspecified fall, subsequent encounter: Secondary | ICD-10-CM | POA: Diagnosis not present

## 2019-04-21 DIAGNOSIS — M48062 Spinal stenosis, lumbar region with neurogenic claudication: Secondary | ICD-10-CM | POA: Diagnosis not present

## 2019-04-21 DIAGNOSIS — Z9181 History of falling: Secondary | ICD-10-CM | POA: Diagnosis not present

## 2019-04-21 DIAGNOSIS — I491 Atrial premature depolarization: Secondary | ICD-10-CM | POA: Diagnosis not present

## 2019-04-21 DIAGNOSIS — G7000 Myasthenia gravis without (acute) exacerbation: Secondary | ICD-10-CM | POA: Diagnosis not present

## 2019-04-21 DIAGNOSIS — Z7952 Long term (current) use of systemic steroids: Secondary | ICD-10-CM | POA: Diagnosis not present

## 2019-04-21 DIAGNOSIS — M1990 Unspecified osteoarthritis, unspecified site: Secondary | ICD-10-CM | POA: Diagnosis not present

## 2019-04-21 DIAGNOSIS — Z7982 Long term (current) use of aspirin: Secondary | ICD-10-CM | POA: Diagnosis not present

## 2019-04-21 DIAGNOSIS — S72012D Unspecified intracapsular fracture of left femur, subsequent encounter for closed fracture with routine healing: Secondary | ICD-10-CM | POA: Diagnosis not present

## 2019-04-21 DIAGNOSIS — Z87440 Personal history of urinary (tract) infections: Secondary | ICD-10-CM | POA: Diagnosis not present

## 2019-04-21 DIAGNOSIS — I119 Hypertensive heart disease without heart failure: Secondary | ICD-10-CM | POA: Diagnosis not present

## 2019-04-21 DIAGNOSIS — M25562 Pain in left knee: Secondary | ICD-10-CM | POA: Diagnosis not present

## 2019-04-21 DIAGNOSIS — R296 Repeated falls: Secondary | ICD-10-CM | POA: Diagnosis not present

## 2019-04-22 ENCOUNTER — Telehealth: Payer: Self-pay | Admitting: Internal Medicine

## 2019-04-22 DIAGNOSIS — I491 Atrial premature depolarization: Secondary | ICD-10-CM | POA: Diagnosis not present

## 2019-04-22 DIAGNOSIS — M48062 Spinal stenosis, lumbar region with neurogenic claudication: Secondary | ICD-10-CM | POA: Diagnosis not present

## 2019-04-22 DIAGNOSIS — M25562 Pain in left knee: Secondary | ICD-10-CM | POA: Diagnosis not present

## 2019-04-22 DIAGNOSIS — Z9682 Presence of neurostimulator: Secondary | ICD-10-CM | POA: Diagnosis not present

## 2019-04-22 DIAGNOSIS — S72012D Unspecified intracapsular fracture of left femur, subsequent encounter for closed fracture with routine healing: Secondary | ICD-10-CM | POA: Diagnosis not present

## 2019-04-22 DIAGNOSIS — Z87440 Personal history of urinary (tract) infections: Secondary | ICD-10-CM | POA: Diagnosis not present

## 2019-04-22 DIAGNOSIS — Z7982 Long term (current) use of aspirin: Secondary | ICD-10-CM | POA: Diagnosis not present

## 2019-04-22 DIAGNOSIS — M1990 Unspecified osteoarthritis, unspecified site: Secondary | ICD-10-CM | POA: Diagnosis not present

## 2019-04-22 DIAGNOSIS — R296 Repeated falls: Secondary | ICD-10-CM | POA: Diagnosis not present

## 2019-04-22 DIAGNOSIS — Z9181 History of falling: Secondary | ICD-10-CM | POA: Diagnosis not present

## 2019-04-22 DIAGNOSIS — I119 Hypertensive heart disease without heart failure: Secondary | ICD-10-CM | POA: Diagnosis not present

## 2019-04-22 DIAGNOSIS — G7000 Myasthenia gravis without (acute) exacerbation: Secondary | ICD-10-CM | POA: Diagnosis not present

## 2019-04-22 DIAGNOSIS — W19XXXD Unspecified fall, subsequent encounter: Secondary | ICD-10-CM | POA: Diagnosis not present

## 2019-04-22 DIAGNOSIS — M25561 Pain in right knee: Secondary | ICD-10-CM | POA: Diagnosis not present

## 2019-04-22 DIAGNOSIS — Z7952 Long term (current) use of systemic steroids: Secondary | ICD-10-CM | POA: Diagnosis not present

## 2019-04-22 NOTE — Telephone Encounter (Signed)
Copied from [place] [phone]. Topic: General - Other >> Apr 22, 2019  2:42 PM Keene Breath wrote: Reason for CRM: Liji with [facility] called to request verbal orders for patient - 2x wk for 6 wks, 1x wk for 2 wks, for balance, gait and strengthening.  Please call back to give orders at [phone]

## 2019-04-22 NOTE — Telephone Encounter (Signed)
ok 

## 2019-04-23 NOTE — Telephone Encounter (Signed)
Called Liji and gave verbal okay per PCP.

## 2019-04-28 ENCOUNTER — Telehealth: Payer: Self-pay | Admitting: Internal Medicine

## 2019-04-28 ENCOUNTER — Telehealth: Payer: Self-pay | Admitting: Cardiovascular Disease

## 2019-04-28 DIAGNOSIS — G7000 Myasthenia gravis without (acute) exacerbation: Secondary | ICD-10-CM | POA: Diagnosis not present

## 2019-04-28 DIAGNOSIS — I491 Atrial premature depolarization: Secondary | ICD-10-CM | POA: Diagnosis not present

## 2019-04-28 DIAGNOSIS — W19XXXD Unspecified fall, subsequent encounter: Secondary | ICD-10-CM | POA: Diagnosis not present

## 2019-04-28 DIAGNOSIS — S72012D Unspecified intracapsular fracture of left femur, subsequent encounter for closed fracture with routine healing: Secondary | ICD-10-CM | POA: Diagnosis not present

## 2019-04-28 DIAGNOSIS — Z9181 History of falling: Secondary | ICD-10-CM | POA: Diagnosis not present

## 2019-04-28 DIAGNOSIS — I119 Hypertensive heart disease without heart failure: Secondary | ICD-10-CM | POA: Diagnosis not present

## 2019-04-28 DIAGNOSIS — Z7952 Long term (current) use of systemic steroids: Secondary | ICD-10-CM | POA: Diagnosis not present

## 2019-04-28 DIAGNOSIS — M25562 Pain in left knee: Secondary | ICD-10-CM | POA: Diagnosis not present

## 2019-04-28 DIAGNOSIS — M1990 Unspecified osteoarthritis, unspecified site: Secondary | ICD-10-CM | POA: Diagnosis not present

## 2019-04-28 DIAGNOSIS — Z9682 Presence of neurostimulator: Secondary | ICD-10-CM | POA: Diagnosis not present

## 2019-04-28 DIAGNOSIS — M48062 Spinal stenosis, lumbar region with neurogenic claudication: Secondary | ICD-10-CM | POA: Diagnosis not present

## 2019-04-28 DIAGNOSIS — M25561 Pain in right knee: Secondary | ICD-10-CM | POA: Diagnosis not present

## 2019-04-28 DIAGNOSIS — Z87440 Personal history of urinary (tract) infections: Secondary | ICD-10-CM | POA: Diagnosis not present

## 2019-04-28 DIAGNOSIS — R296 Repeated falls: Secondary | ICD-10-CM | POA: Diagnosis not present

## 2019-04-28 DIAGNOSIS — Z7982 Long term (current) use of aspirin: Secondary | ICD-10-CM | POA: Diagnosis not present

## 2019-04-28 NOTE — Telephone Encounter (Signed)
Spoke with husband and the way he explained the situation it is mainly when she exerts herself. They are trying to get her up and do some exercises and she can barely do anything from getting so SOB and exhausted. Not other sx. No cough, fever, wheezing or any other sx related. Wanted to know if he should see [place] or heart doctor.

## 2019-04-28 NOTE — Telephone Encounter (Signed)
Please advise 

## 2019-04-28 NOTE — Telephone Encounter (Signed)
The patient's husband, stated his wife had hip surgery about 6 weeks ago and she has had SOB. He stated it has not worsened, but every time she gets up and moves she experiences SOB. She can move a maximum of about 50 ft then she needs to take a break to catch her breath. The patient does not have any swelling or chest pain. The husband, stated she feels fine when she sits and relaxes. Sending to Dr. Acie Fredrickson.

## 2019-04-28 NOTE — Telephone Encounter (Signed)
Husband aware of response. Pt has a cardiologist. He will call them to get an appointment.

## 2019-04-28 NOTE — Telephone Encounter (Signed)
New Message    Pt c/o Shortness Of Breath: STAT if SOB developed within the last 24 hours or pt is noticeably SOB on the phone  1. Are you currently SOB (can you hear that pt is SOB on the phone)? Pts husband is calling, when she tries to get up and do anything she is SOB   2. How long have you been experiencing SOB? He says she had hip surgery 6 weeks ago and she can hardly do anything   3. Are you SOB when sitting or when up moving around? Moving around   4. Are you currently experiencing any other symptoms? No

## 2019-04-28 NOTE — Telephone Encounter (Signed)
Spouse called stating that patient has been experiencing shortness of breath and trying to build up her strength since her last surgery.  Spouse states they are not able to complete a V visit.  Would like to make an in office appointment or phone visit.  Please advise.

## 2019-04-28 NOTE — Telephone Encounter (Signed)
Need more info - as we discussed

## 2019-04-28 NOTE — Telephone Encounter (Signed)
Should probably see her heart doctor.  If she is not established with one then we should do a quick visit and I can refer her.

## 2019-04-29 ENCOUNTER — Encounter: Payer: Self-pay | Admitting: Physical Medicine & Rehabilitation

## 2019-04-29 ENCOUNTER — Encounter: Payer: Medicare Other | Attending: Physical Medicine & Rehabilitation | Admitting: Physical Medicine & Rehabilitation

## 2019-04-29 ENCOUNTER — Ambulatory Visit: Payer: Medicare Other | Admitting: Internal Medicine

## 2019-04-29 ENCOUNTER — Other Ambulatory Visit: Payer: Self-pay

## 2019-04-29 VITALS — Ht 64.0 in | Wt 150.0 lb

## 2019-04-29 DIAGNOSIS — S72002S Fracture of unspecified part of neck of left femur, sequela: Secondary | ICD-10-CM | POA: Diagnosis not present

## 2019-04-29 DIAGNOSIS — G7000 Myasthenia gravis without (acute) exacerbation: Secondary | ICD-10-CM

## 2019-04-29 DIAGNOSIS — R269 Unspecified abnormalities of gait and mobility: Secondary | ICD-10-CM

## 2019-04-29 DIAGNOSIS — G479 Sleep disorder, unspecified: Secondary | ICD-10-CM | POA: Insufficient documentation

## 2019-04-29 DIAGNOSIS — C44729 Squamous cell carcinoma of skin of left lower limb, including hip: Secondary | ICD-10-CM | POA: Diagnosis not present

## 2019-04-29 DIAGNOSIS — R0602 Shortness of breath: Secondary | ICD-10-CM

## 2019-04-29 DIAGNOSIS — I10 Essential (primary) hypertension: Secondary | ICD-10-CM

## 2019-04-29 NOTE — Telephone Encounter (Signed)
   Spoke with patient's husband who states he called our office and PCP regarding SOB, cannot walk 30 ft without resting. She walks with a walker. Patient was advised by Dr. Quay Burow, PCP, to call our office for an appointment. Per Dr. Acie Fredrickson, patient will need to be evaluated by APP or DOD in the office for potential causes of SOB s/p hip arthroplasty 6 weeks ago. Husband reports that patient gets SOB with exertion, states she cannot walk more than 30 ft without difficulty breathing; admits that she is also learning to walk again since the hip surgery. Denies swelling of extremities or abdomen and denies chest discomfort. I advised husband of the need for an in-office visit and he states they cannot come this afternoon due to another appointment. I scheduled him with Lyda Jester, PA for tomorrow, 5/29. Husband will need to accompany patient for gait assistance and is aware of Covid-19 screening procedures. He thanked me for my help.   COVID-19 Pre-Screening Questions:  . In the past 7 to 10 days have you had a cough,  shortness of breath, headache, congestion, fever (100 or greater) body aches, chills, sore throat, or sudden loss of taste or sense of smell?  No . Have you been around anyone with known Covid 19.  No . Have you been around anyone who is awaiting Covid 19 test results in the past 7 to 10 days?  No . Have you been around anyone who has been exposed to Covid 19, or has mentioned symptoms of Covid 19 within the past 7 to 10 days?  No  If you have any concerns/questions about symptoms patients report during screening (either on the phone or at threshold). Contact the provider seeing the patient or DOD for further guidance.  If neither are available contact a member of the leadership team.

## 2019-04-29 NOTE — Progress Notes (Signed)
Subjective:    Patient ID: Tamara Gregory, female    DOB: Jun 20, 1925, 85 y.o.   MRN: [id number]  TELEHEALTH NOTE  Due to national recommendations of social distancing due to COVID 19, an audio/video telehealth visit is felt to be most appropriate for this patient at this time.  See Chart message from today for the patient's consent to telehealth from [place].     I verified that I am speaking with the correct person using two identifiers.  Location of patient: Home Location of provider: Office Method of communication: Telephone Names of participants : Zorita Pang scheduling, Marland Mcalpine obtaining consent and vitals if available Established patient Time spent on call: 16 minutes  HPI 85 year old right-handed female with history of colon cancer with resection, chronic back pain followed by Dr. Maryjean Ka with spinal cord stimulator, hypertension, hyperlipidemia, ocular myasthenia gravis presents for hospital follow-up after receiving CIR for left hip fracture status post hemiarthroplasty on 03/11/2019.  Husband supplements history. She states she has been tired since she left the hospital. At discharge, she was instructed to follow up with Ortho, which she did. She sees Cards tomorrow. She has not followed up with Neurology for some time, husband states he wants to follow up. She contacted PCP.  Denies falls. Denies pain, only taking Tylenol.   Therapies: 2/week DME: Bedside commode Mobility: Walker at all times  Pain Inventory Average Pain 5 Pain Right Now 5 My pain is intermittent, dull and aching  In the last 24 hours, has pain interfered with the following? General activity 5 Relation with others 5 Enjoyment of life 5 What TIME of day is your pain at its worst? daytime Sleep (in general) Fair  Pain is worse with: walking, bending, standing and some activites Pain improves with: rest and medication Relief from Meds: 5  Mobility use a  wheelchair needs help with transfers  Function retired  Neuro/Psych bladder control problems bowel control problems weakness numbness tingling trouble walking  Prior Studies Any changes since last visit?  no  Physicians involved in your care Any changes since last visit?  no   Family History  Problem Relation Age of Onset  . Heart attack Father   . Diabetes Father   . Cancer Sister   . Breast cancer Sister   . Cancer Brother   . Lung cancer Brother   . Cancer Sister   . Liver cancer Sister   . Diabetes Sister   . Colon polyps Neg Hx   . Colon cancer Neg Hx   . Esophageal cancer Neg Hx   . Rectal cancer Neg Hx   . Stomach cancer Neg Hx   . Adrenal disorder Neg Hx    Social History   Socioeconomic History  . Marital status: Married    Spouse name: Not on file  . Number of children: 3  . Years of education: Not on file  . Highest education level: Not on file  Occupational History  . Occupation: retired  Scientific laboratory technician  . Financial resource strain: Not hard at all  . Food insecurity:    Worry: Never true    Inability: Never true  . Transportation needs:    Medical: No    Non-medical: No  Tobacco Use  . Smoking status: Never Smoker  . Smokeless tobacco: Never Used  Substance and Sexual Activity  . Alcohol use: No  . Drug use: No  . Sexual activity: Not Currently  Lifestyle  . Physical  activity:    Days per week: 0 days    Minutes per session: 0 min  . Stress: Rather much  Relationships  . Social connections:    Talks on phone: More than three times a week    Gets together: Once a week    Attends religious service: 1 to 4 times per year    Active member of club or organization: Yes    Attends meetings of clubs or organizations: More than 4 times per year    Relationship status: Married  Other Topics Concern  . Not on file  Social History Narrative   Lives with husband in a one story home.  Has 2 sons.  Retired.  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  Education: college.  [employer].   Past Surgical History:  Procedure Laterality Date  . ABDOMINAL HYSTERECTOMY    . CHOLECYSTECTOMY    . COLON SURGERY     Right colectomy dr. gross 03-17-18  . HIP ARTHROPLASTY Left 03/11/2019   Procedure: ARTHROPLASTY BIPOLAR HIP (HEMIARTHROPLASTY);  Surgeon: Renette Butters, MD;  Location: [facility];  Service: Orthopedics;  Laterality: Left;  . LUMBAR LAMINECTOMY/DECOMPRESSION MICRODISCECTOMY N/A 02/04/2013   Procedure: CENTRAL DECOMPRESSION/LUMBAR LAMINECTOMY L3-L4 AND L4-L5 2 LEVELS;  Surgeon: Tobi Bastos, MD;  Location: [facility];  Service: Orthopedics;  Laterality: N/A;  . SPINAL CORD STIMULATOR IMPLANT     in right hip but not currently using because it did not help  . TONSILLECTOMY    . UPPER GASTROINTESTINAL ENDOSCOPY    . US ECHOCARDIOGRAPHY  05/18/2007   EF 55-60%   Past Medical History:  Diagnosis Date  . Anemia   . Anxiety   . Arthritis   . Cancer (HCC)    hx of skin cancer , colon   . Cataract    removed both eyes   . Chronic back pain    okay with sitting, increased back pain with activity   . Depression   . GERD (gastroesophageal reflux disease)   . Glaucoma   . Headache(784.0)    hx of  . History of kidney stones   . History of migraines   . Hyperlipidemia   . Hypertension   . Macular degeneration   . Myasthenia gravis ([place])   . Occasional tremors   . Pneumonia   . Spinal stenosis   . Thoracic aneurysm    4.4cm; see CT done 01/28/12 and 01/22/13 in [facility].   [name] Uses hearing aid   . Vitamin D deficiency    Ht 5\' 4"  (1.626 m)   Wt 150 lb (68 kg)   BMI 25.75 kg/m   Opioid Risk Score:   Fall Risk Score:  `1  Depression screen PHQ 2/9  Depression screen [facility] 2/9 01/27/2019 11/19/2017 03/19/2017 05/06/2016  Decreased Interest 3 3 3  0  Down, Depressed, Hopeless 3 2 2  0  PHQ - 2 Score 6 5 5  0  Altered sleeping 3 2 1  -  Tired, decreased energy 3 3 3  -  Change in appetite 1 1 1  -  Feeling bad or  failure about yourself  2 1 0 -  Trouble concentrating 1 0 0 -  Moving slowly or fidgety/restless 0 0 0 -  Suicidal thoughts 0 0 0 -  PHQ-9 Score 16 12 10  -  Difficult doing work/chores Somewhat difficult Somewhat difficult - -    Review of Systems  Constitutional: Negative.   HENT: Negative.   Eyes: Negative.  Respiratory: Positive for shortness of breath.   Cardiovascular: Negative.   Gastrointestinal: Negative.   Endocrine: Negative.   Genitourinary: Positive for difficulty urinating.  Musculoskeletal: Positive for gait problem.  Skin: Negative.   Allergic/Immunologic: Negative.   Neurological: Positive for weakness and numbness.  Hematological: Negative.   Psychiatric/Behavioral: Negative.   All other systems reviewed and are negative.     Objective:   Physical Exam Gen: NAD. Pulm: Effort normal Neuro: Alert     Assessment & Plan:  85 year old right-handed female with history of colon cancer with resection, chronic back pain followed by Dr. Maryjean Ka with spinal cord stimulator, hypertension, hyperlipidemia, ocular myasthenia gravis presents for hospital follow-up after receiving CIR for left hip fracture status post hemiarthroplasty on 03/11/2019.  1. Decreased functional mobility secondary to left displaced femoral neck fracture .Status post bipolar hip hemiarthroplasty on 03/11/2019.  Cont therapies, encouraged HEP  Cont to follow up with therapy  2. Pain Management/chronic back pain:   Improved at present  Cont tylenol PRN  3. Poor appetite  Encourage supplementation  4. History of ocular myasthenia gravis.   Continue chronic prednisone  5. Hypertension.   Cont meds   6. Poor sleep  Sleep during the day and has difficulty at night  Recommended reorientation of sleep/waker cycle  Could also be confounded by poor oxygenation  7. SOB  Follow up with Cards tomorrow  8. Gait abnormality  Cont therapies   Cont walker for safety  Meds reviewed Referrals  reviewed All questions answered

## 2019-04-29 NOTE — Telephone Encounter (Signed)
Agree with the plan to her see Gerre Pebbles. PA

## 2019-04-30 ENCOUNTER — Other Ambulatory Visit: Payer: Self-pay

## 2019-04-30 ENCOUNTER — Ambulatory Visit: Payer: Medicare Other | Admitting: Cardiology

## 2019-04-30 ENCOUNTER — Encounter: Payer: Self-pay | Admitting: Cardiology

## 2019-04-30 ENCOUNTER — Other Ambulatory Visit: Payer: Self-pay | Admitting: Cardiology

## 2019-04-30 VITALS — BP 122/78 | HR 83 | Ht 64.0 in | Wt 148.0 lb

## 2019-04-30 DIAGNOSIS — R011 Cardiac murmur, unspecified: Secondary | ICD-10-CM

## 2019-04-30 DIAGNOSIS — I350 Nonrheumatic aortic (valve) stenosis: Secondary | ICD-10-CM

## 2019-04-30 DIAGNOSIS — R0602 Shortness of breath: Secondary | ICD-10-CM

## 2019-04-30 NOTE — Patient Instructions (Signed)
Medication Instructions:  No changes If you need a refill on your cardiac medications before your next appointment, please call your pharmacy.   Lab work: Today: DDimer, cbc, bnp, bmet If you have labs (blood work) drawn today and your tests are completely normal, you will receive your results only by: [name] MyChart Message (if you have MyChart) OR . A paper copy in the mail If you have any lab test that is abnormal or we need to change your treatment, we will call you to review the results.  Testing/Procedures: Your physician has requested that you have an echocardiogram. Echocardiography is a painless test that uses sound waves to create images of your heart. It provides your doctor with information about the size and shape of your heart and how well your heart's chambers and valves are working. This procedure takes approximately one hour. There are no restrictions for this procedure.    Follow-Up: 2 week tele health vist (after echo)  Any Other Special Instructions Will Be Listed Below (If Applicable).

## 2019-04-30 NOTE — Progress Notes (Signed)
04/30/2019 Tamara Gregory   03-01-1925  [id number]  Primary Physician Binnie Rail, MD Primary Cardiologist: Mertie Moores, MD  Electrophysiologist: None   Reason for Visit/CC: Dyspnea   HPI:  Tamara Gregory is a 85 y.o. female who is being seen today for the evaluation dyspnea. She is followed by Dr. Acie Fredrickson.  She has a history of hypertension, hyperlipidemia, ascending thoracic aneurysm, aortic stenosis, dementia and myasthenia gravis.  She was recently admitted first part of April after a mechanical fall resulting in left femoral neck fracture and required orthopedic surgery.  She did well without any cardiac complications.  She is back at home and getting home physical therapy.  Limited mobility currently.  Still primarily using a wheelchair but has had difficulties with transfer, mainly with significant dyspnea.  She gets short of breath moving from wheelchair to bedside commode.  Also very short of breath walking short distances around her house, while working with physical therapy.  She denies any chest pain.  No leg swelling or leg pain.  She was not placed on anticoagulation as prophylactic measures for DVT prevention.  She is comfortable at rest.  Denies any weight gain.  Sleeps with one pillow.  No orthopnea or PND.  She does note some occasional wheezing however lung sounds are clear to auscultation bilaterally.  I reviewed her chart.  She had an echocardiogram in 2015 that showed normal EF and mild aortic stenosis.  No repeat assessment since that time.  Her EKG shows sinus arrhythmia.  Heart rate 83 bpm.  Blood pressure is stable 122/78.  Oxygen saturations 96% on room air.   Cardiac Studies  Echocardiogram 01/26/2013: Study Conclusions: - Left ventricle: The cavity size was normal. Wall thickness was increased in a pattern of moderate LVH. There was mild focal basal hypertrophy of the septum. Systolic function was normal. The estimated ejection fraction was in the range  of 55% to 60%. Wall motion was normal; there were no regional wall motion abnormalities. Doppler parameters are consistent with abnormal left ventricular relaxation (grade 1 diastolic dysfunction). - Aortic valve: Valve mobility was restricted. There was mild stenosis. Mild regurgitation. Valve area: 1.6cm^2(VTI). Valve area: 1.48cm^2 (Vmax).   Current Meds  Medication Sig  . acetaminophen (TYLENOL) 325 MG tablet Take 2 tablets (650 mg total) by mouth every 6 (six) hours as needed for headache.  [name] aspirin 81 MG chewable tablet Chew 0.5 tablets (40.5 mg total) by mouth daily.  . brimonidine (ALPHAGAN) 0.2 % ophthalmic solution Place 1 drop into both eyes 3 (three) times daily.   . Calcium Carbonate (CALCIUM 500 PO) Take 500 mg by mouth daily.   . Cholecalciferol (VITAMIN D) 125 MCG (5000 UT) CAPS Take 5,000 Units by mouth daily.   . fluticasone (FLONASE) 50 MCG/ACT nasal spray Place 1 spray into both nostrils at bedtime.  [name] HYDROcodone-acetaminophen (NORCO/VICODIN) 5-325 MG tablet Take 1 tablet by mouth every 6 (six) hours as needed for severe pain (pain score 4-8).  [name] latanoprost (XALATAN) 0.005 % ophthalmic solution Place 1 drop into the left eye at bedtime.  [name] losartan-hydrochlorothiazide (HYZAAR) 50-12.5 MG tablet Take 1 tablet by mouth daily.  . Multiple Vitamins-Minerals (PRESERVISION AREDS) CAPS Take 1 capsule by mouth 2 (two) times daily.   . Omega-3 Fatty Acids (FISH OIL) 435 MG CAPS Take 435 mg by mouth daily.  . pantoprazole (PROTONIX) 40 MG tablet Take 1 tablet (40 mg total) by mouth daily.  . rosuvastatin (CRESTOR) 5 MG tablet TAKE 1  TABLET EVERY DAY IN THE EVENING  . sertraline (ZOLOFT) 100 MG tablet Take 200 mg by mouth at bedtime. Take 2 tabs at bedtime   Allergies  Allergen Reactions  . Tramadol Other (See Comments)    delirium   Past Medical History:  Diagnosis Date  . Anemia   . Anxiety   . Arthritis   . Cancer (HCC)    hx of skin cancer , colon   .  Cataract    removed both eyes   . Chronic back pain    okay with sitting, increased back pain with activity   . Depression   . GERD (gastroesophageal reflux disease)   . Glaucoma   . Headache(784.0)    hx of  . History of kidney stones   . History of migraines   . Hyperlipidemia   . Hypertension   . Macular degeneration   . Myasthenia gravis ([place])   . Occasional tremors   . Pneumonia   . Spinal stenosis   . Thoracic aneurysm    4.4cm; see CT done 01/28/12 and 01/22/13 in [facility].   [name] Uses hearing aid   . Vitamin D deficiency    Family History  Problem Relation Age of Onset  . Heart attack Father   . Diabetes Father   . Cancer Sister   . Breast cancer Sister   . Cancer Brother   . Lung cancer Brother   . Cancer Sister   . Liver cancer Sister   . Diabetes Sister   . Colon polyps Neg Hx   . Colon cancer Neg Hx   . Esophageal cancer Neg Hx   . Rectal cancer Neg Hx   . Stomach cancer Neg Hx   . Adrenal disorder Neg Hx    Past Surgical History:  Procedure Laterality Date  . ABDOMINAL HYSTERECTOMY    . CHOLECYSTECTOMY    . COLON SURGERY     Right colectomy dr. gross 03-17-18  . HIP ARTHROPLASTY Left 03/11/2019   Procedure: ARTHROPLASTY BIPOLAR HIP (HEMIARTHROPLASTY);  Surgeon: Renette Butters, MD;  Location: [facility];  Service: Orthopedics;  Laterality: Left;  . LUMBAR LAMINECTOMY/DECOMPRESSION MICRODISCECTOMY N/A 02/04/2013   Procedure: CENTRAL DECOMPRESSION/LUMBAR LAMINECTOMY L3-L4 AND L4-L5 2 LEVELS;  Surgeon: Tobi Bastos, MD;  Location: [facility];  Service: Orthopedics;  Laterality: N/A;  . SPINAL CORD STIMULATOR IMPLANT     in right hip but not currently using because it did not help  . TONSILLECTOMY    . UPPER GASTROINTESTINAL ENDOSCOPY    . US ECHOCARDIOGRAPHY  05/18/2007   EF 55-60%   Social History   Socioeconomic History  . Marital status: Married    Spouse name: Not on file  . Number of children: 3  . Years of education: Not on file  . Highest education  level: Not on file  Occupational History  . Occupation: retired  Scientific laboratory technician  . Financial resource strain: Not hard at all  . Food insecurity:    Worry: Never true    Inability: Never true  . Transportation needs:    Medical: No    Non-medical: No  Tobacco Use  . Smoking status: Never Smoker  . Smokeless tobacco: Never Used  Substance and Sexual Activity  . Alcohol use: No  . Drug use: No  . Sexual activity: Not Currently  Lifestyle  . Physical activity:    Days per week: 0 days    Minutes per session: 0 min  . Stress: Rather much  Relationships  .  Social connections:    Talks on phone: More than three times a week    Gets together: Once a week    Attends religious service: 1 to 4 times per year    Active member of club or organization: Yes    Attends meetings of clubs or organizations: More than 4 times per year    Relationship status: Married  . Intimate partner violence:    Fear of current or ex partner: No    Emotionally abused: No    Physically abused: No    Forced sexual activity: No  Other Topics Concern  . Not on file  Social History Narrative   Lives with husband in a one story home.  Has 2 sons.  Retired.  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  Education: college.  [employer].     Lipid Panel     Component Value Date/Time   CHOL 157 11/19/2017 1235   TRIG 142.0 11/19/2017 1235   HDL 67.20 11/19/2017 1235   CHOLHDL 2 11/19/2017 1235   VLDL 28.4 11/19/2017 1235   LDLCALC 61 11/19/2017 1235   LDLDIRECT 139.3 10/04/2013 1000    Review of Systems: General: negative for chills, fever, night sweats or weight changes.  Cardiovascular: negative for chest pain, dyspnea on exertion, edema, orthopnea, palpitations, paroxysmal nocturnal dyspnea or shortness of breath Dermatological: negative for rash Respiratory: negative for cough or wheezing Urologic: negative for hematuria Abdominal: negative for nausea, vomiting, diarrhea, bright  red blood per rectum, melena, or hematemesis Neurologic: negative for visual changes, syncope, or dizziness All other systems reviewed and are otherwise negative except as noted above.   Physical Exam:  Blood pressure 122/78, pulse 83, height 5\' 4"  (1.626 m), weight 148 lb (67.1 kg), SpO2 96 %.  General appearance: alert, cooperative, mild distress and elderly WF in wheelchair Neck: no carotid bruit and no JVD Lungs: clear to auscultation bilaterally Heart: regular rate and rhythm and 2/3 SM at RUSB and LLSB Extremities: no edema,  Pulses: 2+ and symmetric Skin: Skin color, texture, turgor normal. No rashes or lesions Neurologic: Grossly normal  EKG sinus arrhythmia, 83 bpm -- personally reviewed   ASSESSMENT AND PLAN:   1.  Dyspnea: Patient complains of exertional dyspnea several weeks post hip surgery for treatment of left femoral neck fracture.  She denies any associated chest pain.  Her EKG shows sinus arrhythmia.  No tachycardia.  She denies any associated leg pain or swelling. O2 sats 96% on RA.  I have low suspicion for PE however given recent history of orthopedic surgery and decreased mobility, we will check a d-dimer to rule out possibility for PE.  Given aortic stenosis noted on echocardiogram in 2015, I will also order a repeat echocardiogram to reevaluate for worsening aortic stenosis.  Murmur is auscultated on exam today at right upper sternal border and left lower sternal border.  She does not appear to be grossly volume overloaded and lung exam is unremarkable, however will check a BNP to rule out CHF.  I will also plan to repeat a CBC to rule out anemia given her last CBC showed a hemoglobin level 8.3.  Her dyspnea may also be simply related to deconditioning.  Will arrange telehealth visit in 1 to 2 weeks, after echocardiogram is completed.   Brittainy Ladoris Gene, MHS [facility] 04/30/2019 4:57 PM

## 2019-05-01 ENCOUNTER — Other Ambulatory Visit: Payer: Self-pay | Admitting: Cardiology

## 2019-05-01 DIAGNOSIS — I5031 Acute diastolic (congestive) heart failure: Secondary | ICD-10-CM

## 2019-05-01 LAB — CBC
Hematocrit: 31.7 % — ABNORMAL LOW (ref 34.0–46.6)
Hemoglobin: 9.8 g/dL — ABNORMAL LOW (ref 11.1–15.9)
MCH: 27.1 pg (ref 26.6–33.0)
MCHC: 30.9 g/dL — ABNORMAL LOW (ref 31.5–35.7)
MCV: 88 fL (ref 79–97)
Platelets: 276 10*3/uL (ref 150–450)
RBC: 3.61 x10E6/uL — ABNORMAL LOW (ref 3.77–5.28)
RDW: 16 % — ABNORMAL HIGH (ref 11.7–15.4)
WBC: 6.6 10*3/uL (ref 3.4–10.8)

## 2019-05-01 LAB — BASIC METABOLIC PANEL
BUN/Creatinine Ratio: 24 (ref 12–28)
BUN: 15 mg/dL (ref 10–36)
CO2: 21 mmol/L (ref 20–29)
Calcium: 9.9 mg/dL (ref 8.7–10.3)
Chloride: 100 mmol/L (ref 96–106)
Creatinine, Ser: 0.63 mg/dL (ref 0.57–1.00)
GFR calc Af Amer: 90 mL/min/{1.73_m2} (ref >59)
GFR calc non Af Amer: 78 mL/min/{1.73_m2} (ref >59)
Glucose: 122 mg/dL — ABNORMAL HIGH (ref 65–99)
Potassium: 4.1 mmol/L (ref 3.5–5.2)
Sodium: 138 mmol/L (ref 134–144)

## 2019-05-01 LAB — SPECIMEN STATUS REPORT

## 2019-05-01 LAB — PRO B NATRIURETIC PEPTIDE: NT-Pro BNP: 4751 pg/mL — ABNORMAL HIGH (ref 0–738)

## 2019-05-01 LAB — D-DIMER, QUANTITATIVE

## 2019-05-01 MED ORDER — FUROSEMIDE 20 MG PO TABS: 20.0000 mg | ORAL_TABLET | Freq: Every day | ORAL | 11 refills | Status: DC

## 2019-05-03 ENCOUNTER — Telehealth: Payer: Self-pay

## 2019-05-03 ENCOUNTER — Other Ambulatory Visit: Payer: Self-pay | Admitting: Nurse Practitioner

## 2019-05-03 ENCOUNTER — Other Ambulatory Visit: Payer: Self-pay

## 2019-05-03 DIAGNOSIS — I5031 Acute diastolic (congestive) heart failure: Secondary | ICD-10-CM

## 2019-05-03 DIAGNOSIS — R0602 Shortness of breath: Secondary | ICD-10-CM

## 2019-05-03 DIAGNOSIS — I10 Essential (primary) hypertension: Secondary | ICD-10-CM

## 2019-05-03 DIAGNOSIS — R899 Unspecified abnormal finding in specimens from other organs, systems and tissues: Secondary | ICD-10-CM

## 2019-05-03 LAB — D-DIMER, QUANTITATIVE: D-DIMER: 3.5 mg{FEU}/L — ABNORMAL HIGH (ref 0.00–0.49)

## 2019-05-03 NOTE — Telephone Encounter (Signed)
Notes recorded by Frederik Schmidt, RN on 05/03/2019 at 1:56 PM EDT The patient has been notified of the result and verbalized understanding. All questions (if any) were answered. Frederik Schmidt, RN 05/03/2019 1:56 PM

## 2019-05-03 NOTE — Telephone Encounter (Signed)
Spoke to Dr Vertell Limber and patient and informed them that they may walk into [facility] at [address] on 6/2 to get Chest CT done.  They verbalized understanding and were thankful for the call.

## 2019-05-03 NOTE — Telephone Encounter (Signed)
-----   Message from Consuelo Pandy, [place] sent at 05/03/2019  1:50 PM EDT ----- Received late result on  D-dimer. Was not resulted at time of review of labs on 5/30. Initially reported as canceled. D-dimer elevated at 3.50. Given dyspnea in the setting of recent hip surgery and period of immobility, I recommend chest CT to r/o PE. Please notify pt.

## 2019-05-03 NOTE — Telephone Encounter (Signed)
Left message for Fallon Haecker in regards to patient's D-Dimer level and need for Chest CT, Friday 6/5 @ 2:30 pm.

## 2019-05-03 NOTE — Telephone Encounter (Signed)
Pt called back returning Michael's call °

## 2019-05-04 ENCOUNTER — Ambulatory Visit
Admission: RE | Admit: 2019-05-04 | Discharge: 2019-05-04 | Disposition: A | Discharge status: Home / Self Care | Payer: Medicare Other | Source: Ambulatory Visit | Attending: Cardiology | Admitting: Cardiology

## 2019-05-04 ENCOUNTER — Other Ambulatory Visit: Payer: Self-pay

## 2019-05-04 DIAGNOSIS — I491 Atrial premature depolarization: Secondary | ICD-10-CM | POA: Diagnosis not present

## 2019-05-04 DIAGNOSIS — M25561 Pain in right knee: Secondary | ICD-10-CM | POA: Diagnosis not present

## 2019-05-04 DIAGNOSIS — Z7982 Long term (current) use of aspirin: Secondary | ICD-10-CM | POA: Diagnosis not present

## 2019-05-04 DIAGNOSIS — M25562 Pain in left knee: Secondary | ICD-10-CM | POA: Diagnosis not present

## 2019-05-04 DIAGNOSIS — R0602 Shortness of breath: Secondary | ICD-10-CM | POA: Diagnosis not present

## 2019-05-04 DIAGNOSIS — Z87440 Personal history of urinary (tract) infections: Secondary | ICD-10-CM | POA: Diagnosis not present

## 2019-05-04 DIAGNOSIS — Z7952 Long term (current) use of systemic steroids: Secondary | ICD-10-CM | POA: Diagnosis not present

## 2019-05-04 DIAGNOSIS — Z9181 History of falling: Secondary | ICD-10-CM | POA: Diagnosis not present

## 2019-05-04 DIAGNOSIS — M48062 Spinal stenosis, lumbar region with neurogenic claudication: Secondary | ICD-10-CM | POA: Diagnosis not present

## 2019-05-04 DIAGNOSIS — S72012D Unspecified intracapsular fracture of left femur, subsequent encounter for closed fracture with routine healing: Secondary | ICD-10-CM | POA: Diagnosis not present

## 2019-05-04 DIAGNOSIS — R7989 Other specified abnormal findings of blood chemistry: Secondary | ICD-10-CM | POA: Diagnosis not present

## 2019-05-04 DIAGNOSIS — Z9682 Presence of neurostimulator: Secondary | ICD-10-CM | POA: Diagnosis not present

## 2019-05-04 DIAGNOSIS — G7000 Myasthenia gravis without (acute) exacerbation: Secondary | ICD-10-CM | POA: Diagnosis not present

## 2019-05-04 DIAGNOSIS — M1990 Unspecified osteoarthritis, unspecified site: Secondary | ICD-10-CM | POA: Diagnosis not present

## 2019-05-04 DIAGNOSIS — R899 Unspecified abnormal finding in specimens from other organs, systems and tissues: Secondary | ICD-10-CM

## 2019-05-04 DIAGNOSIS — I119 Hypertensive heart disease without heart failure: Secondary | ICD-10-CM | POA: Diagnosis not present

## 2019-05-04 DIAGNOSIS — W19XXXD Unspecified fall, subsequent encounter: Secondary | ICD-10-CM | POA: Diagnosis not present

## 2019-05-04 DIAGNOSIS — R296 Repeated falls: Secondary | ICD-10-CM | POA: Diagnosis not present

## 2019-05-04 MED ORDER — IOPAMIDOL (ISOVUE-370) INJECTION 76%
75.0000 mL | Freq: Once | INTRAVENOUS | Status: AC | PRN
  Administered 2019-05-04: 75 mL via INTRAVENOUS

## 2019-05-04 NOTE — Addendum Note (Signed)
Addended by: Jeremy Johann on: 05/04/2019 02:01 PM   Modules accepted: Orders

## 2019-05-05 DIAGNOSIS — S72002D Fracture of unspecified part of neck of left femur, subsequent encounter for closed fracture with routine healing: Secondary | ICD-10-CM | POA: Diagnosis not present

## 2019-05-06 ENCOUNTER — Telehealth: Payer: Self-pay | Admitting: *Deleted

## 2019-05-06 DIAGNOSIS — C44729 Squamous cell carcinoma of skin of left lower limb, including hip: Secondary | ICD-10-CM | POA: Diagnosis not present

## 2019-05-06 NOTE — Addendum Note (Signed)
Addended by: Jeremy Johann on: 05/06/2019 11:01 AM   Modules accepted: Orders

## 2019-05-06 NOTE — Telephone Encounter (Signed)
    COVID-19 Pre-Screening Questions:  . In the past 7 to 10 days have you had a cough,  shortness of breath, headache, congestion, fever (100 or greater) body aches, chills, sore throat, or sudden loss of taste or sense of smell? . Have you been around anyone with known Covid 19. . Have you been around anyone who is awaiting Covid 19 test results in the past 7 to 10 days? . Have you been around anyone who has been exposed to Covid 19, or has mentioned symptoms of Covid 19 within the past 7 to 10 days?  If you have any concerns/questions about symptoms patients report during screening (either on the phone or at threshold). Contact the provider seeing the patient or DOD for further guidance.  If neither are available contact a member of the leadership team.           Contacted patient via telephone call. No to all Covid 19 questions. Has a mask.  KB

## 2019-05-07 ENCOUNTER — Ambulatory Visit (HOSPITAL_COMMUNITY): Admission: RE | Admit: 2019-05-07 | Payer: Medicare Other | Source: Ambulatory Visit

## 2019-05-07 ENCOUNTER — Telehealth: Payer: Self-pay

## 2019-05-07 ENCOUNTER — Other Ambulatory Visit: Payer: Medicare Other | Admitting: *Deleted

## 2019-05-07 ENCOUNTER — Other Ambulatory Visit: Payer: Self-pay

## 2019-05-07 DIAGNOSIS — I119 Hypertensive heart disease without heart failure: Secondary | ICD-10-CM | POA: Diagnosis not present

## 2019-05-07 DIAGNOSIS — Z7982 Long term (current) use of aspirin: Secondary | ICD-10-CM | POA: Diagnosis not present

## 2019-05-07 DIAGNOSIS — G7000 Myasthenia gravis without (acute) exacerbation: Secondary | ICD-10-CM | POA: Diagnosis not present

## 2019-05-07 DIAGNOSIS — Z7952 Long term (current) use of systemic steroids: Secondary | ICD-10-CM | POA: Diagnosis not present

## 2019-05-07 DIAGNOSIS — R0602 Shortness of breath: Secondary | ICD-10-CM

## 2019-05-07 DIAGNOSIS — W19XXXD Unspecified fall, subsequent encounter: Secondary | ICD-10-CM | POA: Diagnosis not present

## 2019-05-07 DIAGNOSIS — Z87440 Personal history of urinary (tract) infections: Secondary | ICD-10-CM | POA: Diagnosis not present

## 2019-05-07 DIAGNOSIS — I10 Essential (primary) hypertension: Secondary | ICD-10-CM | POA: Diagnosis not present

## 2019-05-07 DIAGNOSIS — Z9181 History of falling: Secondary | ICD-10-CM | POA: Diagnosis not present

## 2019-05-07 DIAGNOSIS — M48062 Spinal stenosis, lumbar region with neurogenic claudication: Secondary | ICD-10-CM | POA: Diagnosis not present

## 2019-05-07 DIAGNOSIS — R296 Repeated falls: Secondary | ICD-10-CM | POA: Diagnosis not present

## 2019-05-07 DIAGNOSIS — I491 Atrial premature depolarization: Secondary | ICD-10-CM | POA: Diagnosis not present

## 2019-05-07 DIAGNOSIS — M25562 Pain in left knee: Secondary | ICD-10-CM | POA: Diagnosis not present

## 2019-05-07 DIAGNOSIS — S72012D Unspecified intracapsular fracture of left femur, subsequent encounter for closed fracture with routine healing: Secondary | ICD-10-CM | POA: Diagnosis not present

## 2019-05-07 DIAGNOSIS — Z9682 Presence of neurostimulator: Secondary | ICD-10-CM | POA: Diagnosis not present

## 2019-05-07 DIAGNOSIS — M25561 Pain in right knee: Secondary | ICD-10-CM | POA: Diagnosis not present

## 2019-05-07 DIAGNOSIS — I5031 Acute diastolic (congestive) heart failure: Secondary | ICD-10-CM | POA: Diagnosis not present

## 2019-05-07 DIAGNOSIS — M1990 Unspecified osteoarthritis, unspecified site: Secondary | ICD-10-CM | POA: Diagnosis not present

## 2019-05-07 LAB — BASIC METABOLIC PANEL
BUN/Creatinine Ratio: 24 (ref 12–28)
BUN: 19 mg/dL (ref 10–36)
CO2: 23 mmol/L (ref 20–29)
Calcium: 9.3 mg/dL (ref 8.7–10.3)
Chloride: 100 mmol/L (ref 96–106)
Creatinine, Ser: 0.78 mg/dL (ref 0.57–1.00)
GFR calc Af Amer: 76 mL/min/{1.73_m2} (ref >59)
GFR calc non Af Amer: 66 mL/min/{1.73_m2} (ref >59)
Glucose: 192 mg/dL — ABNORMAL HIGH (ref 65–99)
Potassium: 3.1 mmol/L — ABNORMAL LOW (ref 3.5–5.2)
Sodium: 141 mmol/L (ref 134–144)

## 2019-05-07 LAB — PRO B NATRIURETIC PEPTIDE: NT-Pro BNP: 4628 pg/mL — ABNORMAL HIGH (ref 0–738)

## 2019-05-07 MED ORDER — POTASSIUM CHLORIDE CRYS ER 20 MEQ PO TBCR: 40.0000 meq | EXTENDED_RELEASE_TABLET | Freq: Every day | ORAL | 3 refills | Status: DC

## 2019-05-07 MED ORDER — FUROSEMIDE 20 MG PO TABS: 40.0000 mg | ORAL_TABLET | Freq: Every day | ORAL | 3 refills | Status: DC

## 2019-05-07 NOTE — Telephone Encounter (Signed)
-----   Message from [place], [place] sent at 05/07/2019  4:40 PM EDT ----- Please check in with pt to see if she has had any improvement in symptoms since starting lasix. Her BNP has not changed much. Her kidney function is stable. If she has not noticed much improvement, we can try increasing Lasix to 40 mg daily, as long as her BP has been ok. I started her on a low dose initially. Add supplemental K for hypokalemia, 40 mEq daily.

## 2019-05-07 NOTE — Telephone Encounter (Signed)
Notes recorded by Frederik Schmidt, RN on 05/07/2019 at 4:51 PM EDT The patient has been notified of the result and verbalized understanding. We have increased her Lasix to 40 mg daily and added Potassium 40 mEq Daily. All questions (if any) were answered. Frederik Schmidt, RN 05/07/2019 4:49 PM

## 2019-05-10 ENCOUNTER — Telehealth (HOSPITAL_COMMUNITY): Payer: Self-pay | Admitting: *Deleted

## 2019-05-10 DIAGNOSIS — R296 Repeated falls: Secondary | ICD-10-CM | POA: Diagnosis not present

## 2019-05-10 DIAGNOSIS — S72012D Unspecified intracapsular fracture of left femur, subsequent encounter for closed fracture with routine healing: Secondary | ICD-10-CM | POA: Diagnosis not present

## 2019-05-10 DIAGNOSIS — Z7982 Long term (current) use of aspirin: Secondary | ICD-10-CM | POA: Diagnosis not present

## 2019-05-10 DIAGNOSIS — W19XXXD Unspecified fall, subsequent encounter: Secondary | ICD-10-CM | POA: Diagnosis not present

## 2019-05-10 DIAGNOSIS — Z9682 Presence of neurostimulator: Secondary | ICD-10-CM | POA: Diagnosis not present

## 2019-05-10 DIAGNOSIS — M25562 Pain in left knee: Secondary | ICD-10-CM | POA: Diagnosis not present

## 2019-05-10 DIAGNOSIS — M48062 Spinal stenosis, lumbar region with neurogenic claudication: Secondary | ICD-10-CM | POA: Diagnosis not present

## 2019-05-10 DIAGNOSIS — Z87440 Personal history of urinary (tract) infections: Secondary | ICD-10-CM | POA: Diagnosis not present

## 2019-05-10 DIAGNOSIS — M25561 Pain in right knee: Secondary | ICD-10-CM | POA: Diagnosis not present

## 2019-05-10 DIAGNOSIS — G7000 Myasthenia gravis without (acute) exacerbation: Secondary | ICD-10-CM | POA: Diagnosis not present

## 2019-05-10 DIAGNOSIS — Z7952 Long term (current) use of systemic steroids: Secondary | ICD-10-CM | POA: Diagnosis not present

## 2019-05-10 DIAGNOSIS — I491 Atrial premature depolarization: Secondary | ICD-10-CM | POA: Diagnosis not present

## 2019-05-10 DIAGNOSIS — M1990 Unspecified osteoarthritis, unspecified site: Secondary | ICD-10-CM | POA: Diagnosis not present

## 2019-05-10 DIAGNOSIS — I119 Hypertensive heart disease without heart failure: Secondary | ICD-10-CM | POA: Diagnosis not present

## 2019-05-10 DIAGNOSIS — Z9181 History of falling: Secondary | ICD-10-CM | POA: Diagnosis not present

## 2019-05-10 NOTE — Telephone Encounter (Signed)
COVID-19 Pre-Screening Questions:  . Do you currently have a fever? No (yes = cancel and refer to pcp for e-visit) . Have you recently travelled on a cruise, internationally, or to [place], [place], [place], [place], [place], or [place], [place] ([place]) ? No (yes = cancel, stay home, monitor symptoms, and contact pcp or initiate e-visit if symptoms develop) . Have you been in contact with someone that is currently pending confirmation of Covid19 testing or has been confirmed to have the Covid19 virus?  No (yes = cancel, stay home, away from tested individual, monitor symptoms, and contact pcp or initiate e-visit if symptoms develop) . Are you currently experiencing fatigue or cough? No (yes = pt should be prepared to have a mask placed at the time of their visit).   . Reiterated no additional visitors. . Arrive no earlier than 15 minutes before appointment time. . Please bring own mask.  Tamara Gregory 

## 2019-05-11 ENCOUNTER — Ambulatory Visit (HOSPITAL_COMMUNITY): Payer: Medicare Other | Attending: Cardiovascular Disease

## 2019-05-11 ENCOUNTER — Other Ambulatory Visit: Payer: Self-pay

## 2019-05-11 DIAGNOSIS — I350 Nonrheumatic aortic (valve) stenosis: Secondary | ICD-10-CM

## 2019-05-11 DIAGNOSIS — R011 Cardiac murmur, unspecified: Secondary | ICD-10-CM | POA: Diagnosis not present

## 2019-05-11 DIAGNOSIS — R0602 Shortness of breath: Secondary | ICD-10-CM | POA: Diagnosis not present

## 2019-05-11 MED ORDER — PERFLUTREN LIPID MICROSPHERE
1.0000 mL | INTRAVENOUS | Status: AC | PRN
  Administered 2019-05-11: 2 mL via INTRAVENOUS

## 2019-05-13 ENCOUNTER — Telehealth: Payer: Self-pay

## 2019-05-13 DIAGNOSIS — C44729 Squamous cell carcinoma of skin of left lower limb, including hip: Secondary | ICD-10-CM | POA: Diagnosis not present

## 2019-05-13 NOTE — Telephone Encounter (Signed)
YOUR CARDIOLOGY TEAM HAS ARRANGED FOR AN E-VISIT FOR YOUR APPOINTMENT - PLEASE REVIEW IMPORTANT INFORMATION BELOW SEVERAL DAYS PRIOR TO YOUR APPOINTMENT  Due to the recent COVID-19 pandemic, we are transitioning in-person office visits to tele-medicine visits in an effort to decrease unnecessary exposure to our patients, their families, and staff. These visits are billed to your insurance just like a normal visit is. We also encourage you to sign up for MyChart if you have not already done so. You will need a smartphone if possible. For patients that do not have this, we can still complete the visit using a regular telephone but do prefer a smartphone to enable video when possible. You may have a family member that lives with you that can help. If possible, we also ask that you have a blood pressure cuff and scale at home to measure your blood pressure, heart rate and weight prior to your scheduled appointment. Patients with clinical needs that need an in-person evaluation and testing will still be able to come to the office if absolutely necessary. If you have any questions, feel free to call our office.     YOUR PROVIDER WILL BE USING THE FOLLOWING PLATFORM TO COMPLETE YOUR VISIT: Doximity  . IF USING MYCHART - How to Download the MyChart App to Your SmartPhone   - If Apple, go to App Store and type in MyChart in the search bar and download the app. If Android, ask patient to go to Google Play Store and type in MyChart in the search bar and download the app. The app is free but as with any other app downloads, your phone may require you to verify saved payment information or Apple/Android password.  - You will need to then log into the app with your MyChart username and password, and select [facility] as your healthcare provider to link the account.  - When it is time for your visit, go to the MyChart app, find appointments, and click Begin Video Visit. Be sure to Select Allow for your device to  access the Microphone and Camera for your visit. You will then be connected, and your provider will be with you shortly.  **If you have any issues connecting or need assistance, please contact MyChart service desk [phone] ([phone])**  **If using a computer, in order to ensure the best quality for your visit, you will need to use either of the following Internet Browsers: Google Chrome or Microsoft Edge**  . IF USING DOXIMITY or DOXY.ME - The staff will give you instructions on receiving your link to join the meeting the day of your visit.      2-3 DAYS BEFORE YOUR APPOINTMENT  You will receive a telephone call from one of our [facility] team members - your caller ID may say "Unknown caller." If this is a video visit, we will walk you through how to get the video launched on your phone. We will remind you check your blood pressure, heart rate and weight prior to your scheduled appointment. If you have an Apple Watch or Kardia, please upload any pertinent ECG strips the day before or morning of your appointment to MyChart. Our staff will also make sure you have reviewed the consent and agree to move forward with your scheduled tele-health visit.     THE DAY OF YOUR APPOINTMENT  Approximately 15 minutes prior to your scheduled appointment, you will receive a telephone call from one of Heart[facility] team - your caller ID may say "Unknown caller."    Our staff will confirm medications, vital signs for the day and any symptoms you may be experiencing. Please have this information available prior to the time of visit start. It may also be helpful for you to have a pad of paper and pen handy for any instructions given during your visit. They will also walk you through joining the smartphone meeting if this is a video visit.    CONSENT FOR TELE-HEALTH VISIT - PLEASE REVIEW  I hereby voluntarily request, consent and authorize [facility] and its employed or contracted physicians, physician  assistants, nurse practitioners or other licensed health care professionals (the Practitioner), to provide me with telemedicine health care services (the "Services") as deemed necessary by the treating Practitioner. I acknowledge and consent to receive the Services by the Practitioner via telemedicine. I understand that the telemedicine visit will involve communicating with the Practitioner through live audiovisual communication technology and the disclosure of certain medical information by electronic transmission. I acknowledge that I have been given the opportunity to request an in-person assessment or other available alternative prior to the telemedicine visit and am voluntarily participating in the telemedicine visit.  I understand that I have the right to withhold or withdraw my consent to the use of telemedicine in the course of my care at any time, without affecting my right to future care or treatment, and that the Practitioner or I may terminate the telemedicine visit at any time. I understand that I have the right to inspect all information obtained and/or recorded in the course of the telemedicine visit and may receive copies of available information for a reasonable fee.  I understand that some of the potential risks of receiving the Services via telemedicine include:  . Delay or interruption in medical evaluation due to technological equipment failure or disruption; . Information transmitted may not be sufficient (e.g. poor resolution of images) to allow for appropriate medical decision making by the Practitioner; and/or  . In rare instances, security protocols could fail, causing a breach of personal health information.  Furthermore, I acknowledge that it is my responsibility to provide information about my medical history, conditions and care that is complete and accurate to the best of my ability. I acknowledge that Practitioner's advice, recommendations, and/or decision may be based on  factors not within their control, such as incomplete or inaccurate data provided by me or distortions of diagnostic images or specimens that may result from electronic transmissions. I understand that the practice of medicine is not an exact science and that Practitioner makes no warranties or guarantees regarding treatment outcomes. I acknowledge that I will receive a copy of this consent concurrently upon execution via email to the email address I last provided but may also request a printed copy by calling the office of [facility].    I understand that my insurance will be billed for this visit.   I have read or had this consent read to me. . I understand the contents of this consent, which adequately explains the benefits and risks of the Services being provided via telemedicine.  . I have been provided ample opportunity to ask questions regarding this consent and the Services and have had my questions answered to my satisfaction. . I give my informed consent for the services to be provided through the use of telemedicine in my medical care  By participating in this telemedicine visit I agree to the above.  

## 2019-05-14 ENCOUNTER — Other Ambulatory Visit: Payer: Self-pay

## 2019-05-14 ENCOUNTER — Encounter: Payer: Self-pay | Admitting: Cardiovascular Disease

## 2019-05-14 ENCOUNTER — Ambulatory Visit: Payer: Medicare Other | Admitting: Cardiovascular Disease

## 2019-05-14 VITALS — BP 106/72 | HR 66 | Ht 64.0 in

## 2019-05-14 DIAGNOSIS — Z87440 Personal history of urinary (tract) infections: Secondary | ICD-10-CM | POA: Diagnosis not present

## 2019-05-14 DIAGNOSIS — Z9682 Presence of neurostimulator: Secondary | ICD-10-CM | POA: Diagnosis not present

## 2019-05-14 DIAGNOSIS — R296 Repeated falls: Secondary | ICD-10-CM | POA: Diagnosis not present

## 2019-05-14 DIAGNOSIS — I5023 Acute on chronic systolic (congestive) heart failure: Secondary | ICD-10-CM | POA: Diagnosis not present

## 2019-05-14 DIAGNOSIS — M25562 Pain in left knee: Secondary | ICD-10-CM | POA: Diagnosis not present

## 2019-05-14 DIAGNOSIS — S72012D Unspecified intracapsular fracture of left femur, subsequent encounter for closed fracture with routine healing: Secondary | ICD-10-CM | POA: Diagnosis not present

## 2019-05-14 DIAGNOSIS — W19XXXD Unspecified fall, subsequent encounter: Secondary | ICD-10-CM | POA: Diagnosis not present

## 2019-05-14 DIAGNOSIS — I5022 Chronic systolic (congestive) heart failure: Secondary | ICD-10-CM | POA: Insufficient documentation

## 2019-05-14 DIAGNOSIS — Z7982 Long term (current) use of aspirin: Secondary | ICD-10-CM | POA: Diagnosis not present

## 2019-05-14 DIAGNOSIS — I119 Hypertensive heart disease without heart failure: Secondary | ICD-10-CM | POA: Diagnosis not present

## 2019-05-14 DIAGNOSIS — I491 Atrial premature depolarization: Secondary | ICD-10-CM | POA: Diagnosis not present

## 2019-05-14 DIAGNOSIS — Z9181 History of falling: Secondary | ICD-10-CM | POA: Diagnosis not present

## 2019-05-14 DIAGNOSIS — I10 Essential (primary) hypertension: Secondary | ICD-10-CM

## 2019-05-14 DIAGNOSIS — M48062 Spinal stenosis, lumbar region with neurogenic claudication: Secondary | ICD-10-CM | POA: Diagnosis not present

## 2019-05-14 DIAGNOSIS — M25561 Pain in right knee: Secondary | ICD-10-CM | POA: Diagnosis not present

## 2019-05-14 DIAGNOSIS — G7000 Myasthenia gravis without (acute) exacerbation: Secondary | ICD-10-CM | POA: Diagnosis not present

## 2019-05-14 DIAGNOSIS — Z7952 Long term (current) use of systemic steroids: Secondary | ICD-10-CM | POA: Diagnosis not present

## 2019-05-14 DIAGNOSIS — M1990 Unspecified osteoarthritis, unspecified site: Secondary | ICD-10-CM | POA: Diagnosis not present

## 2019-05-14 MED ORDER — LOSARTAN POTASSIUM 25 MG PO TABS: 25.0000 mg | ORAL_TABLET | Freq: Every day | ORAL | 3 refills | Status: DC

## 2019-05-14 NOTE — Progress Notes (Signed)
CARDIOLOGY OFFICE NOTE  Date:  05/14/2019    Tamara Gregory Date of Birth: [birth year] Medical Record #[id number]  PCP:  Binnie Rail, MD  Cardiologist:  Former patient of Dr. Sherryl Barters.   Now Tamara Gregory  No chief complaint on file.   Notes from Truitt Merle.  Tamara Gregory is a 85 y.o. female who presents today for a 4 month check. Former patient of Dr. Sherryl Barters.   She has a past history of HLD, HTN and myasthenia gravis and is followed for that at [facility]. She has had previous back surgery by Dr. Gladstone Lighter. She has a known 4 cm fusiform ascending aortic aneurysm . When the patient was initially admitted for her back surgery she had some chest pain in the preoperative holding area and her back surgery had to be postponed while she underwent a cardiac workup. She had a Myoview stress test which showed no ischemia. Her back surgery was subsequently rescheduled and performed without incident. She made a good recovery from her back surgery. However she has continued to have back pain and for this reason she underwent a implantation of a spinal cord stimulator as an outpatient at [facility] outpatient facility on 01/27/2014. The patient remains on low-dose prednisone 5 mg daily for her myasthenia gravis.   In March of 2015 she was hospitalized overnight at [facility] in [facility] for chest pain. She had a dobutamine stress echo the following morning which showed no evidence of ischemia and she was released. She was admitted with bradycardia and her metoprolol was stopped and has not been restarted.  Last visit back in November and she was felt to be doing well.   Comes back today. Here with her husband who is also being seen. She is mostly limited by her back pain - going to [place] later today. Taking at least 2 sometimes 3 Excedrins on a daily basis. Some belly pain. Husband notes stool has been dark. She has early satiety. No  chest pain. Breathing is good but she is more sedentary and will have some DOE with much activity. [place] the [place] - [place]. Has PCP arranged for June.   Sept. 21, 2017:  Tamara Gregory is seen today for the first time.   Previous patient of [facility].  Has of HTN Doing well today .   Has a tens unit implanted in her hip   No CP or dyspnea   February 24, 2017:  Tamara Gregory is seen today with her husband Tamara Gregory her 89th birthday recently .  Hx of hyperlipidemia , HTN,  No cardiac issues.  Had some chest pain /  Indigestion. Resolved when she dranks some soda   Dec. 19, 2018:  Has had some orthostasis .  Has fallen severl times.  BP has been low Not eating as much  Is on losartan - HCT   May 14, 2019   Tamara Gregory is seen in the office to discuss her recent symptoms of shortness of breath.  She had an echocardiogram recently which revealed severe aortic stenosis.  She was also found to have a large  apical aneurysm.  Her left ventricular systolic function is at least moderately reduced.  EF 35-40%.   She was also found to have mild - moderate aortic dilitation ( 4.4 cm )   She had a hip fracture 2 months ago . Lack of energy  No CP.  Has  shortness of breath frequently .  waks with a walker since the hip fracture .  Prior to hip , she could walk but not far.   Has lots of back pain  BNP has been markedly elevated.   Does not avoid salt Was started on Furosemide which seems to be helping.   Is very weak.  Is still on HCTZ also  Past Medical History:  Diagnosis Date  . Anemia   . Anxiety   . Arthritis   . Cancer (HCC)    hx of skin cancer , colon   . Cataract    removed both eyes   . Chronic back pain    okay with sitting, increased back pain with activity   . Depression   . GERD (gastroesophageal reflux disease)   . Glaucoma   . Headache(784.0)    hx of  . History of kidney stones   . History of migraines   . Hyperlipidemia   .  Hypertension   . Macular degeneration   . Myasthenia gravis ([place])   . Occasional tremors   . Pneumonia   . Spinal stenosis   . Thoracic aneurysm    4.4cm; see CT done 01/28/12 and 01/22/13 in [facility].   [name] Uses hearing aid   . Vitamin D deficiency     Past Surgical History:  Procedure Laterality Date  . ABDOMINAL HYSTERECTOMY    . CHOLECYSTECTOMY    . COLON SURGERY     Right colectomy dr. gross 03-17-18  . HIP ARTHROPLASTY Left 03/11/2019   Procedure: ARTHROPLASTY BIPOLAR HIP (HEMIARTHROPLASTY);  Surgeon: Renette Butters, MD;  Location: [facility];  Service: Orthopedics;  Laterality: Left;  . LUMBAR LAMINECTOMY/DECOMPRESSION MICRODISCECTOMY N/A 02/04/2013   Procedure: CENTRAL DECOMPRESSION/LUMBAR LAMINECTOMY L3-L4 AND L4-L5 2 LEVELS;  Surgeon: Tobi Bastos, MD;  Location: [facility];  Service: Orthopedics;  Laterality: N/A;  . SPINAL CORD STIMULATOR IMPLANT     in right hip but not currently using because it did not help  . TONSILLECTOMY    . UPPER GASTROINTESTINAL ENDOSCOPY    . US ECHOCARDIOGRAPHY  05/18/2007   EF 55-60%     Medications: Current Outpatient Medications  Medication Sig Dispense Refill  . acetaminophen (TYLENOL) 325 MG tablet Take 2 tablets (650 mg total) by mouth every 6 (six) hours as needed for headache.    [name] aspirin 81 MG chewable tablet Chew 0.5 tablets (40.5 mg total) by mouth daily. 30 tablet 0  . brimonidine (ALPHAGAN) 0.2 % ophthalmic solution Place 1 drop into both eyes 3 (three) times daily.     . Calcium Carbonate (CALCIUM 500 PO) Take 500 mg by mouth daily.     . Cholecalciferol (VITAMIN D) 125 MCG (5000 UT) CAPS Take 5,000 Units by mouth daily.     . fluticasone (FLONASE) 50 MCG/ACT nasal spray Place 1 spray into both nostrils at bedtime.    . furosemide (LASIX) 20 MG tablet Take 2 tablets (40 mg total) by mouth daily. 90 tablet 3  . HYDROcodone-acetaminophen (NORCO/VICODIN) 5-325 MG tablet Take 1 tablet by mouth every 6 (six) hours as needed for severe pain  (pain score 4-8). 30 tablet 0  . latanoprost (XALATAN) 0.005 % ophthalmic solution Place 1 drop into the left eye at bedtime.  1  . Multiple Vitamins-Minerals (PRESERVISION AREDS) CAPS Take 1 capsule by mouth 2 (two) times daily.     . Omega-3 Fatty Acids (FISH OIL) 435 MG CAPS Take 435 mg by mouth daily.    [name]  pantoprazole (PROTONIX) 40 MG tablet Take 1 tablet (40 mg total) by mouth daily. 30 tablet 0  . potassium chloride SA (K-DUR) 20 MEQ tablet Take 2 tablets (40 mEq total) by mouth daily. 90 tablet 3  . rosuvastatin (CRESTOR) 5 MG tablet TAKE 1 TABLET EVERY DAY IN THE EVENING 90 tablet 1  . sertraline (ZOLOFT) 100 MG tablet Take 200 mg by mouth at bedtime. Take 2 tabs at bedtime    . losartan (COZAAR) 25 MG tablet Take 1 tablet (25 mg total) by mouth daily. 90 tablet 3   No current facility-administered medications for this visit.     Allergies: Allergies  Allergen Reactions  . Tramadol Other (See Comments)    delirium    Social History: The patient  reports that she has never smoked. She has never used smokeless tobacco. She reports that she does not drink alcohol or use drugs.   Family History: The patient's family history includes Breast cancer in her sister; Cancer in her brother, sister, and sister; Diabetes in her father and sister; Heart attack in her father; Liver cancer in her sister; Lung cancer in her brother.   Review of Systems: Please see the history of present illness.   Otherwise, the review of systems is positive for none.   All other systems are reviewed and negative.   Physical Exam: Blood pressure 106/72, pulse 66, height 5\' 4"  (1.626 m).  GEN:  Elderly female,  Examined in wheelchair ,  Chronically ill appear HEENT: Normal NECK: No JVD; No carotid bruits LYMPHATICS: No lymphadenopathy CARDIAC:   Irreg. Irreg. 1-2 / 6 systolic murmur  RESPIRATORY:  Clear to auscultation without rales, wheezing or rhonchi  ABDOMEN: Soft, non-tender, non-distended  MUSCULOSKELETAL:  No edema;  Legs are wrapped.  SKIN: Warm and dry NEUROLOGIC:  Alert and oriented x 3   LABORATORY DATA:  EKG: November 19, 2017: Normal sinus rhythm at 81.  She has premature atrial contractions.  Lab Results  Component Value Date   WBC 6.6 04/30/2019   HGB 9.8 (L) 04/30/2019   HCT 31.7 (L) 04/30/2019   PLT 276 04/30/2019   GLUCOSE 192 (H) 05/07/2019   CHOL 157 11/19/2017   TRIG 142.0 11/19/2017   HDL 67.20 11/19/2017   LDLDIRECT 139.3 10/04/2013   LDLCALC 61 11/19/2017   ALT 20 03/16/2019   AST 21 03/16/2019   NA 141 05/07/2019   K 3.1 (L) 05/07/2019   CL 100 05/07/2019   CREATININE 0.78 05/07/2019   BUN 19 05/07/2019   CO2 23 05/07/2019   TSH 3.53 11/19/2017   INR 1.01 01/21/2013   HGBA1C 5.6 03/13/2018    BNP (last 3 results) No results for input(s): BNP in the last 8760 hours.  ProBNP (last 3 results) Recent Labs    04/30/19 1701 05/07/19 0955  PROBNP 4,751* 4,628*     Other Studies Reviewed Today:  Echo Study Conclusions from 2014  - Left ventricle: The cavity size was normal. Wall thickness was increased in a pattern of moderate LVH. There was mild focal basal hypertrophy of the septum. Systolic function was normal. The estimated ejection fraction was in the range of 55% to 60%. Wall motion was normal; there were no regional wall motion abnormalities. Doppler parameters are consistent with abnormal left ventricular relaxation (grade 1 diastolic dysfunction). - Aortic valve: Valve mobility was restricted. There was mild stenosis. Mild regurgitation. Valve area: 1.6cm^2(VTI). Valve area: 1.48cm^2 (Vmax).  CT Angio CHEST IMPRESSION FROM 01/2013: Unchanged thoracic aneurysm with maximal  measurement 44 mm. Severe atherosclerosis with ulcerated plaque along the arch but no acute abnormalities.    Assessment/Plan: 1. Acute on chronic systolic CHF:     Tamara Gregory has been having difficulty with generalized  weakness, dyspnea, fatigue BNP is markedly elevated.  Echo shows a large ant. Apical aneurism with EF of 20-25%.   Also shows significant calcification of her AV with mod - severe AS  She is very weak and deconditioned.     I think her symptoms are due to a combinatin of her markedly reduced LV function with the AS.   I do not think simply doing a TAVR would help her in any significant way .   we discussed salt restriction.    Continue lasix Will DC HCTZ since I dont think its necessary with the lasix.   2.  CAD :  Has evidence of a large ant. MI    2. Hypercholesterolemia -    3. Myasthenia gravis followed at [facility]  4. Past history of spinal stenosis with chronic low back pain. Implantation of spinal cord stimulator at [facility] on 01/27/14 - seeing provider at [facility] today.   5.  Frequent falls and poor balance  6. History of fusiform aneurysm of ascending aorta 4 cm -   Current medicines are reviewed with the patient today.  The patient does not have concerns regarding medicines other than what has been noted above.  The following changes have been made:  See above.  Labs/ tests ordered today include:    No orders of the defined types were placed in this encounter.    Mertie Moores, MD  05/14/2019 11:56 AM    [address],  [place], [place]  [address] Pager [phone] Phone: [phone]; Fax: [phone]

## 2019-05-14 NOTE — Patient Instructions (Addendum)
Medication Instructions:  Your physician has recommended you make the following change in your medication:   1. STOP: Losartan-Hydrochlorthiazide  2. START: losartan 25 mg tablet: Take 1 tablet by mouth once a day   Lab work: None Ordered  If you have labs (blood work) drawn today and your tests are completely normal, you will receive your results only by: [name] MyChart Message (if you have MyChart) OR . A paper copy in the mail If you have any lab test that is abnormal or we need to change your treatment, we will call you to review the results.  Testing/Procedures: None ordered  Follow-Up: . Follow up with Dr. Acie Fredrickson on 08/17/19 at 10:20 AM  Any Other Special Instructions Will Be Listed Below (If Applicable).

## 2019-05-17 ENCOUNTER — Telehealth: Payer: Medicare Other | Admitting: Cardiology

## 2019-05-18 DIAGNOSIS — S72012D Unspecified intracapsular fracture of left femur, subsequent encounter for closed fracture with routine healing: Secondary | ICD-10-CM | POA: Diagnosis not present

## 2019-05-18 DIAGNOSIS — I119 Hypertensive heart disease without heart failure: Secondary | ICD-10-CM | POA: Diagnosis not present

## 2019-05-18 DIAGNOSIS — M48062 Spinal stenosis, lumbar region with neurogenic claudication: Secondary | ICD-10-CM | POA: Diagnosis not present

## 2019-05-18 DIAGNOSIS — M25562 Pain in left knee: Secondary | ICD-10-CM | POA: Diagnosis not present

## 2019-05-18 DIAGNOSIS — I491 Atrial premature depolarization: Secondary | ICD-10-CM | POA: Diagnosis not present

## 2019-05-18 DIAGNOSIS — Z9682 Presence of neurostimulator: Secondary | ICD-10-CM | POA: Diagnosis not present

## 2019-05-18 DIAGNOSIS — Z9181 History of falling: Secondary | ICD-10-CM | POA: Diagnosis not present

## 2019-05-18 DIAGNOSIS — W19XXXD Unspecified fall, subsequent encounter: Secondary | ICD-10-CM | POA: Diagnosis not present

## 2019-05-18 DIAGNOSIS — M25561 Pain in right knee: Secondary | ICD-10-CM | POA: Diagnosis not present

## 2019-05-18 DIAGNOSIS — Z7982 Long term (current) use of aspirin: Secondary | ICD-10-CM | POA: Diagnosis not present

## 2019-05-18 DIAGNOSIS — M1990 Unspecified osteoarthritis, unspecified site: Secondary | ICD-10-CM | POA: Diagnosis not present

## 2019-05-18 DIAGNOSIS — G7000 Myasthenia gravis without (acute) exacerbation: Secondary | ICD-10-CM | POA: Diagnosis not present

## 2019-05-18 DIAGNOSIS — Z87440 Personal history of urinary (tract) infections: Secondary | ICD-10-CM | POA: Diagnosis not present

## 2019-05-18 DIAGNOSIS — R296 Repeated falls: Secondary | ICD-10-CM | POA: Diagnosis not present

## 2019-05-18 DIAGNOSIS — Z7952 Long term (current) use of systemic steroids: Secondary | ICD-10-CM | POA: Diagnosis not present

## 2019-05-20 ENCOUNTER — Other Ambulatory Visit: Payer: Self-pay | Admitting: Internal Medicine

## 2019-05-20 DIAGNOSIS — M25561 Pain in right knee: Secondary | ICD-10-CM | POA: Diagnosis not present

## 2019-05-20 DIAGNOSIS — R296 Repeated falls: Secondary | ICD-10-CM | POA: Diagnosis not present

## 2019-05-20 DIAGNOSIS — Z9181 History of falling: Secondary | ICD-10-CM | POA: Diagnosis not present

## 2019-05-20 DIAGNOSIS — M25562 Pain in left knee: Secondary | ICD-10-CM | POA: Diagnosis not present

## 2019-05-20 DIAGNOSIS — Z7982 Long term (current) use of aspirin: Secondary | ICD-10-CM | POA: Diagnosis not present

## 2019-05-20 DIAGNOSIS — M1990 Unspecified osteoarthritis, unspecified site: Secondary | ICD-10-CM | POA: Diagnosis not present

## 2019-05-20 DIAGNOSIS — W19XXXD Unspecified fall, subsequent encounter: Secondary | ICD-10-CM | POA: Diagnosis not present

## 2019-05-20 DIAGNOSIS — I119 Hypertensive heart disease without heart failure: Secondary | ICD-10-CM | POA: Diagnosis not present

## 2019-05-20 DIAGNOSIS — Z87440 Personal history of urinary (tract) infections: Secondary | ICD-10-CM | POA: Diagnosis not present

## 2019-05-20 DIAGNOSIS — M48062 Spinal stenosis, lumbar region with neurogenic claudication: Secondary | ICD-10-CM | POA: Diagnosis not present

## 2019-05-20 DIAGNOSIS — Z9682 Presence of neurostimulator: Secondary | ICD-10-CM | POA: Diagnosis not present

## 2019-05-20 DIAGNOSIS — S72012D Unspecified intracapsular fracture of left femur, subsequent encounter for closed fracture with routine healing: Secondary | ICD-10-CM | POA: Diagnosis not present

## 2019-05-20 DIAGNOSIS — I491 Atrial premature depolarization: Secondary | ICD-10-CM | POA: Diagnosis not present

## 2019-05-20 DIAGNOSIS — Z7952 Long term (current) use of systemic steroids: Secondary | ICD-10-CM | POA: Diagnosis not present

## 2019-05-20 DIAGNOSIS — G7000 Myasthenia gravis without (acute) exacerbation: Secondary | ICD-10-CM | POA: Diagnosis not present

## 2019-05-20 DIAGNOSIS — C44729 Squamous cell carcinoma of skin of left lower limb, including hip: Secondary | ICD-10-CM | POA: Diagnosis not present

## 2019-05-25 DIAGNOSIS — Z9181 History of falling: Secondary | ICD-10-CM | POA: Diagnosis not present

## 2019-05-25 DIAGNOSIS — M48062 Spinal stenosis, lumbar region with neurogenic claudication: Secondary | ICD-10-CM | POA: Diagnosis not present

## 2019-05-25 DIAGNOSIS — M1990 Unspecified osteoarthritis, unspecified site: Secondary | ICD-10-CM | POA: Diagnosis not present

## 2019-05-25 DIAGNOSIS — Z7982 Long term (current) use of aspirin: Secondary | ICD-10-CM | POA: Diagnosis not present

## 2019-05-25 DIAGNOSIS — Z9682 Presence of neurostimulator: Secondary | ICD-10-CM | POA: Diagnosis not present

## 2019-05-25 DIAGNOSIS — I491 Atrial premature depolarization: Secondary | ICD-10-CM | POA: Diagnosis not present

## 2019-05-25 DIAGNOSIS — S72012D Unspecified intracapsular fracture of left femur, subsequent encounter for closed fracture with routine healing: Secondary | ICD-10-CM | POA: Diagnosis not present

## 2019-05-25 DIAGNOSIS — I119 Hypertensive heart disease without heart failure: Secondary | ICD-10-CM | POA: Diagnosis not present

## 2019-05-25 DIAGNOSIS — Z87440 Personal history of urinary (tract) infections: Secondary | ICD-10-CM | POA: Diagnosis not present

## 2019-05-25 DIAGNOSIS — W19XXXD Unspecified fall, subsequent encounter: Secondary | ICD-10-CM | POA: Diagnosis not present

## 2019-05-25 DIAGNOSIS — M25561 Pain in right knee: Secondary | ICD-10-CM | POA: Diagnosis not present

## 2019-05-25 DIAGNOSIS — M25562 Pain in left knee: Secondary | ICD-10-CM | POA: Diagnosis not present

## 2019-05-25 DIAGNOSIS — R296 Repeated falls: Secondary | ICD-10-CM | POA: Diagnosis not present

## 2019-05-25 DIAGNOSIS — Z7952 Long term (current) use of systemic steroids: Secondary | ICD-10-CM | POA: Diagnosis not present

## 2019-05-25 DIAGNOSIS — G7000 Myasthenia gravis without (acute) exacerbation: Secondary | ICD-10-CM | POA: Diagnosis not present

## 2019-05-27 ENCOUNTER — Telehealth: Payer: Self-pay

## 2019-05-27 NOTE — Telephone Encounter (Signed)
Copied from [place] [id number]. Topic: General - Other >> May 27, 2019 11:16 AM Rainey Pines A wrote: Liji  PT needs verbal orders to move patients visit from 05/27/2019 to end of episode. Liji would like a callback at [phone]

## 2019-05-27 NOTE — Telephone Encounter (Signed)
Gave ok for orders per Dr. Burns.  

## 2019-06-03 ENCOUNTER — Telehealth: Payer: Self-pay | Admitting: Cardiovascular Disease

## 2019-06-03 DIAGNOSIS — I10 Essential (primary) hypertension: Secondary | ICD-10-CM

## 2019-06-03 MED ORDER — POT BICARB-POT CHLORIDE 25 MEQ PO TBEF: 25.0000 meq | EFFERVESCENT_TABLET | Freq: Every day | ORAL | 3 refills | Status: DC

## 2019-06-03 NOTE — Telephone Encounter (Signed)
  Tamara Gregory is feeling a bit better with the lasix but has dificulty swallowing the large tablets.   Please DC the Kdur tabs Start K-lyte effervescent tabs 25 meq a day  Please send to [facility] on [address]   Check bmp in 2-3 weeks.      Mertie Moores, MD  06/03/2019 3:21 PM    [facility] [address],  [address] [place], [place]  [address] Pager [phone] Phone: [phone]; Fax: [phone]

## 2019-06-03 NOTE — Telephone Encounter (Signed)
Liji calling back and stated that patient was out of town for most of the week and could only see the patient one time this week. Please advise   [phone]

## 2019-06-03 NOTE — Telephone Encounter (Signed)
New rx called in. BMET scheduled 7/21.  Patient's spouse agrees with plan.

## 2019-06-03 NOTE — Addendum Note (Signed)
Addended by: Harland German A on: 06/03/2019 06:09 PM   Modules accepted: Orders

## 2019-06-04 DIAGNOSIS — G7000 Myasthenia gravis without (acute) exacerbation: Secondary | ICD-10-CM | POA: Diagnosis not present

## 2019-06-04 DIAGNOSIS — M25561 Pain in right knee: Secondary | ICD-10-CM | POA: Diagnosis not present

## 2019-06-04 DIAGNOSIS — Z9181 History of falling: Secondary | ICD-10-CM | POA: Diagnosis not present

## 2019-06-04 DIAGNOSIS — Z87440 Personal history of urinary (tract) infections: Secondary | ICD-10-CM | POA: Diagnosis not present

## 2019-06-04 DIAGNOSIS — S72012D Unspecified intracapsular fracture of left femur, subsequent encounter for closed fracture with routine healing: Secondary | ICD-10-CM | POA: Diagnosis not present

## 2019-06-04 DIAGNOSIS — I491 Atrial premature depolarization: Secondary | ICD-10-CM | POA: Diagnosis not present

## 2019-06-04 DIAGNOSIS — Z9682 Presence of neurostimulator: Secondary | ICD-10-CM | POA: Diagnosis not present

## 2019-06-04 DIAGNOSIS — W19XXXD Unspecified fall, subsequent encounter: Secondary | ICD-10-CM | POA: Diagnosis not present

## 2019-06-04 DIAGNOSIS — Z7952 Long term (current) use of systemic steroids: Secondary | ICD-10-CM | POA: Diagnosis not present

## 2019-06-04 DIAGNOSIS — M25562 Pain in left knee: Secondary | ICD-10-CM | POA: Diagnosis not present

## 2019-06-04 DIAGNOSIS — Z7982 Long term (current) use of aspirin: Secondary | ICD-10-CM | POA: Diagnosis not present

## 2019-06-04 DIAGNOSIS — M1990 Unspecified osteoarthritis, unspecified site: Secondary | ICD-10-CM | POA: Diagnosis not present

## 2019-06-04 DIAGNOSIS — R296 Repeated falls: Secondary | ICD-10-CM | POA: Diagnosis not present

## 2019-06-04 DIAGNOSIS — I119 Hypertensive heart disease without heart failure: Secondary | ICD-10-CM | POA: Diagnosis not present

## 2019-06-04 DIAGNOSIS — M48062 Spinal stenosis, lumbar region with neurogenic claudication: Secondary | ICD-10-CM | POA: Diagnosis not present

## 2019-06-07 ENCOUNTER — Telehealth: Payer: Self-pay | Admitting: Internal Medicine

## 2019-06-07 DIAGNOSIS — Z9682 Presence of neurostimulator: Secondary | ICD-10-CM | POA: Diagnosis not present

## 2019-06-07 DIAGNOSIS — S72012D Unspecified intracapsular fracture of left femur, subsequent encounter for closed fracture with routine healing: Secondary | ICD-10-CM | POA: Diagnosis not present

## 2019-06-07 DIAGNOSIS — I491 Atrial premature depolarization: Secondary | ICD-10-CM | POA: Diagnosis not present

## 2019-06-07 DIAGNOSIS — W19XXXD Unspecified fall, subsequent encounter: Secondary | ICD-10-CM | POA: Diagnosis not present

## 2019-06-07 DIAGNOSIS — M48062 Spinal stenosis, lumbar region with neurogenic claudication: Secondary | ICD-10-CM | POA: Diagnosis not present

## 2019-06-07 DIAGNOSIS — R296 Repeated falls: Secondary | ICD-10-CM | POA: Diagnosis not present

## 2019-06-07 DIAGNOSIS — Z9181 History of falling: Secondary | ICD-10-CM | POA: Diagnosis not present

## 2019-06-07 DIAGNOSIS — M25561 Pain in right knee: Secondary | ICD-10-CM | POA: Diagnosis not present

## 2019-06-07 DIAGNOSIS — G7000 Myasthenia gravis without (acute) exacerbation: Secondary | ICD-10-CM | POA: Diagnosis not present

## 2019-06-07 DIAGNOSIS — I119 Hypertensive heart disease without heart failure: Secondary | ICD-10-CM | POA: Diagnosis not present

## 2019-06-07 DIAGNOSIS — Z7982 Long term (current) use of aspirin: Secondary | ICD-10-CM | POA: Diagnosis not present

## 2019-06-07 DIAGNOSIS — Z87440 Personal history of urinary (tract) infections: Secondary | ICD-10-CM | POA: Diagnosis not present

## 2019-06-07 DIAGNOSIS — M1990 Unspecified osteoarthritis, unspecified site: Secondary | ICD-10-CM | POA: Diagnosis not present

## 2019-06-07 DIAGNOSIS — M25562 Pain in left knee: Secondary | ICD-10-CM | POA: Diagnosis not present

## 2019-06-07 DIAGNOSIS — Z7952 Long term (current) use of systemic steroids: Secondary | ICD-10-CM | POA: Diagnosis not present

## 2019-06-07 NOTE — Telephone Encounter (Signed)
Noted  

## 2019-06-07 NOTE — Telephone Encounter (Signed)
FYI

## 2019-06-07 NOTE — Telephone Encounter (Signed)
Pt had a missed visit last week, (pt was out of town) Tamara Gregory will add that visit to this week. Pt is doing great and making progress.  cb  [phone]

## 2019-06-09 DIAGNOSIS — R296 Repeated falls: Secondary | ICD-10-CM | POA: Diagnosis not present

## 2019-06-09 DIAGNOSIS — Z9682 Presence of neurostimulator: Secondary | ICD-10-CM | POA: Diagnosis not present

## 2019-06-09 DIAGNOSIS — M25562 Pain in left knee: Secondary | ICD-10-CM | POA: Diagnosis not present

## 2019-06-09 DIAGNOSIS — S72012D Unspecified intracapsular fracture of left femur, subsequent encounter for closed fracture with routine healing: Secondary | ICD-10-CM | POA: Diagnosis not present

## 2019-06-09 DIAGNOSIS — M48062 Spinal stenosis, lumbar region with neurogenic claudication: Secondary | ICD-10-CM | POA: Diagnosis not present

## 2019-06-09 DIAGNOSIS — Z7952 Long term (current) use of systemic steroids: Secondary | ICD-10-CM | POA: Diagnosis not present

## 2019-06-09 DIAGNOSIS — G7000 Myasthenia gravis without (acute) exacerbation: Secondary | ICD-10-CM | POA: Diagnosis not present

## 2019-06-09 DIAGNOSIS — M1990 Unspecified osteoarthritis, unspecified site: Secondary | ICD-10-CM | POA: Diagnosis not present

## 2019-06-09 DIAGNOSIS — Z7982 Long term (current) use of aspirin: Secondary | ICD-10-CM | POA: Diagnosis not present

## 2019-06-09 DIAGNOSIS — I491 Atrial premature depolarization: Secondary | ICD-10-CM | POA: Diagnosis not present

## 2019-06-09 DIAGNOSIS — I119 Hypertensive heart disease without heart failure: Secondary | ICD-10-CM | POA: Diagnosis not present

## 2019-06-09 DIAGNOSIS — Z87440 Personal history of urinary (tract) infections: Secondary | ICD-10-CM | POA: Diagnosis not present

## 2019-06-09 DIAGNOSIS — Z9181 History of falling: Secondary | ICD-10-CM | POA: Diagnosis not present

## 2019-06-09 DIAGNOSIS — W19XXXD Unspecified fall, subsequent encounter: Secondary | ICD-10-CM | POA: Diagnosis not present

## 2019-06-09 DIAGNOSIS — M25561 Pain in right knee: Secondary | ICD-10-CM | POA: Diagnosis not present

## 2019-06-10 ENCOUNTER — Encounter: Payer: Medicare Other | Admitting: Physical Medicine & Rehabilitation

## 2019-06-18 ENCOUNTER — Telehealth: Payer: Self-pay | Admitting: Internal Medicine

## 2019-06-18 NOTE — Telephone Encounter (Signed)
noted 

## 2019-06-18 NOTE — Telephone Encounter (Signed)
error 

## 2019-06-18 NOTE — Telephone Encounter (Signed)
Caller/Agency: Liji Sreejesh/[facility] Number: [phone] Is it ok to Omit the visit. Planning on discharging on Tuesday Requesting OT/PT/Skilled Nursing/Social Work/Speech Therapy: PT Frequency: Patient is out of town. Visit today is to be omitted. Patient will be discharged.

## 2019-06-22 ENCOUNTER — Other Ambulatory Visit: Payer: Medicare Other

## 2019-06-22 ENCOUNTER — Other Ambulatory Visit: Payer: Self-pay

## 2019-06-22 DIAGNOSIS — Z87440 Personal history of urinary (tract) infections: Secondary | ICD-10-CM | POA: Diagnosis not present

## 2019-06-22 DIAGNOSIS — I10 Essential (primary) hypertension: Secondary | ICD-10-CM | POA: Diagnosis not present

## 2019-06-22 DIAGNOSIS — S72012D Unspecified intracapsular fracture of left femur, subsequent encounter for closed fracture with routine healing: Secondary | ICD-10-CM | POA: Diagnosis not present

## 2019-06-22 DIAGNOSIS — M25561 Pain in right knee: Secondary | ICD-10-CM | POA: Diagnosis not present

## 2019-06-22 DIAGNOSIS — W19XXXD Unspecified fall, subsequent encounter: Secondary | ICD-10-CM | POA: Diagnosis not present

## 2019-06-22 DIAGNOSIS — Z7952 Long term (current) use of systemic steroids: Secondary | ICD-10-CM | POA: Diagnosis not present

## 2019-06-22 DIAGNOSIS — Z7982 Long term (current) use of aspirin: Secondary | ICD-10-CM | POA: Diagnosis not present

## 2019-06-22 DIAGNOSIS — Z9682 Presence of neurostimulator: Secondary | ICD-10-CM | POA: Diagnosis not present

## 2019-06-22 DIAGNOSIS — R296 Repeated falls: Secondary | ICD-10-CM | POA: Diagnosis not present

## 2019-06-22 DIAGNOSIS — M25562 Pain in left knee: Secondary | ICD-10-CM | POA: Diagnosis not present

## 2019-06-22 DIAGNOSIS — I119 Hypertensive heart disease without heart failure: Secondary | ICD-10-CM | POA: Diagnosis not present

## 2019-06-22 DIAGNOSIS — M1990 Unspecified osteoarthritis, unspecified site: Secondary | ICD-10-CM | POA: Diagnosis not present

## 2019-06-22 DIAGNOSIS — Z9181 History of falling: Secondary | ICD-10-CM | POA: Diagnosis not present

## 2019-06-22 DIAGNOSIS — M48062 Spinal stenosis, lumbar region with neurogenic claudication: Secondary | ICD-10-CM | POA: Diagnosis not present

## 2019-06-22 DIAGNOSIS — G7000 Myasthenia gravis without (acute) exacerbation: Secondary | ICD-10-CM | POA: Diagnosis not present

## 2019-06-22 DIAGNOSIS — I491 Atrial premature depolarization: Secondary | ICD-10-CM | POA: Diagnosis not present

## 2019-06-22 LAB — BASIC METABOLIC PANEL
BUN/Creatinine Ratio: 18 (ref 12–28)
BUN: 14 mg/dL (ref 10–36)
CO2: 25 mmol/L (ref 20–29)
Calcium: 10 mg/dL (ref 8.7–10.3)
Chloride: 92 mmol/L — ABNORMAL LOW (ref 96–106)
Creatinine, Ser: 0.76 mg/dL (ref 0.57–1.00)
GFR calc Af Amer: 79 mL/min/{1.73_m2} (ref >59)
GFR calc non Af Amer: 68 mL/min/{1.73_m2} (ref >59)
Glucose: 163 mg/dL — ABNORMAL HIGH (ref 65–99)
Potassium: 3.3 mmol/L — ABNORMAL LOW (ref 3.5–5.2)
Sodium: 136 mmol/L (ref 134–144)

## 2019-06-23 ENCOUNTER — Telehealth: Payer: Self-pay | Admitting: Nurse Practitioner

## 2019-06-23 MED ORDER — POT BICARB-POT CHLORIDE 25 MEQ PO TBEF: 25.0000 meq | EFFERVESCENT_TABLET | Freq: Every day | ORAL | 3 refills | Status: DC

## 2019-06-23 NOTE — Telephone Encounter (Signed)
-----   Message from Thayer Headings, MD sent at 06/23/2019 12:24 PM EDT ----- Potassium is low.   Lets add Kdur 8 meq a day . Recheck bmp in 1 month Other labs are stable

## 2019-06-23 NOTE — Telephone Encounter (Signed)
Results and plan reviewed with patient's husband. He states patient has had trouble getting accurate dose of K+ due to unable to swallow the pills and pharmacy could not find effervescent tablets. She has been trying to dissolve tablets in orange juice and he doesn't think she is getting full dose. He used [facility] representative in the past. Patient agrees that we will resend Rx for effervescent tabs to [facility] and he will encourage patient to increase dietary intake of K+ rich foods. I will call back next week to check progress.

## 2019-06-24 NOTE — Telephone Encounter (Signed)
Agree with note by Michelle Swinyer, RN  

## 2019-06-25 ENCOUNTER — Telehealth: Payer: Self-pay | Admitting: Pharmacist

## 2019-06-25 MED ORDER — POTASSIUM CHLORIDE CRYS ER 20 MEQ PO TBCR: EXTENDED_RELEASE_TABLET | ORAL | 3 refills | Status: DC

## 2019-06-25 NOTE — Telephone Encounter (Signed)
Patients husband called stating they are unable to get the potassium effervescent tablets prescribed by Dr. Acie Fredrickson. Advised that they could try dissolving the KlorConM tablets in a little bit of water to make a slurry and then add to apple sauce or pudding and eat right away. Also offered K packet Husband would like to try dissolving tablets. Dr. Acie Fredrickson on vacation, confirmed with Dr. Burt Knack DOD who states ok to call in Brielle M tablets daily

## 2019-06-28 NOTE — Progress Notes (Signed)
Subjective:    Patient ID: Tamara Gregory, female    DOB: 03/18/1925, 85 y.o.   MRN: [id number]  HPI The patient is here for an acute visit.  Her husband is with her.  She has not been feeling well since hip surgery 03/2019.  This is not a new complaint, many times she has complained of not feeling well or fatigue.  She does have depression.  She is taking sertraline daily as prescribed.  This and other medications have not seemed to help.  I have recommended seeing a psychiatrist in the past, but she has not done so.  She does have chronic pain and chronic medical problems, which are likely contributing.  She has completed physical therapy for her hip replacement.  She is not very active and not exercising regularly.  She continues to remain fatigued-this is chronic.  Her husband states she generally sleeps for 10 hours at night.  She is taking Tylenol PM.  She does sometimes sleep during the day.  She is nauseous daily and does not want to eat.   She states she will feel a little hungry and will eat something and then feels nauseous.  She denies any reflux or GERD symptoms.  She thinks she is eating enough, but her husband does not.  Diarrhea:  She is taking imodium, but not daily.  Her diarrhea has been worse over the past week.  Losing hair, diffuse: She has been losing hair.  This has gotten worse over the past several months since her hip surgery.  Urinary incontinence -she has a history of overactive bladder and urinary incontinence.  She was on medication in the past, but that was stopped.  Her husband wonders if we can retry it because she is having several episodes a day of incontinence.   Medications and allergies reviewed with patient and updated if appropriate.  Patient Active Problem List   Diagnosis Date Noted  . Acute on chronic systolic CHF (congestive heart failure) ([place]) 05/14/2019  . Sleep disturbance 04/29/2019  . Postoperative pain   . Tachycardia   .  Steroid-induced hyperglycemia   . Labile blood pressure   . Leukocytosis   . E. coli UTI   . Hypoalbuminemia due to protein-calorie malnutrition ([place])   . Ocular myasthenia gravis ([place])   . Anemia   . Hip fracture ([place]) 03/10/2019  . Left displaced femoral neck fracture ([place]) 03/10/2019  . Weakness 12/04/2018  . Hypokalemia 03/18/2018  . 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 Right ovarian cyst s/p RSO 03/17/2018  . Diarrhea 12/23/2017  . Large hiatal hernia 04/01/2017  . Sweating profusely 03/19/2017  . Hyperglycemia 03/19/2017  . Squamous cell skin cancer 11/05/2016  . Nosebleed 10/19/2016  . Osteopenia 08/28/2016  . Essential hypertension, benign 08/21/2016  . Long term current use of systemic steroids 08/21/2016  . GERD (gastroesophageal reflux disease) 05/06/2016  . Mixed incontinence urge and stress 05/06/2016  . Insomnia 07/26/2014  . Malaise and fatigue 10/04/2013  . PAC (premature atrial contraction) 10/04/2013  . Depression 02/15/2013  . Spinal stenosis, lumbar region, with neurogenic claudication 02/04/2013  . Myasthenia gravis ([place]) 03/17/2012  . Pure hypercholesterolemia 09/05/2011  . Benign hypertensive heart disease without heart failure 09/05/2011  . Osteoarthritis 09/05/2011    Current Outpatient Medications on File Prior to Visit  Medication Sig Dispense Refill  . acetaminophen (TYLENOL) 325 MG tablet Take 2 tablets (650 mg total) by mouth every 6 (six) hours as  needed for headache.    [name] aspirin 81 MG chewable tablet Chew 0.5 tablets (40.5 mg total) by mouth daily. 30 tablet 0  . brimonidine (ALPHAGAN) 0.2 % ophthalmic solution Place 1 drop into both eyes 3 (three) times daily.     . Calcium Carbonate (CALCIUM 500 PO) Take 500 mg by mouth daily.     . Cholecalciferol (VITAMIN D) 125 MCG (5000 UT) CAPS Take 5,000 Units by mouth daily.     . fluticasone (FLONASE) 50 MCG/ACT nasal spray Place 1 spray into both nostrils at bedtime.     . furosemide (LASIX) 20 MG tablet Take 2 tablets (40 mg total) by mouth daily. 90 tablet 3  . latanoprost (XALATAN) 0.005 % ophthalmic solution Place 1 drop into the left eye at bedtime.  1  . losartan (COZAAR) 25 MG tablet Take 1 tablet (25 mg total) by mouth daily. 90 tablet 3  . Multiple Vitamins-Minerals (PRESERVISION AREDS) CAPS Take 1 capsule by mouth 2 (two) times daily.     . Omega-3 Fatty Acids (FISH OIL) 435 MG CAPS Take 435 mg by mouth daily.    . pantoprazole (PROTONIX) 40 MG tablet Take 1 tablet (40 mg total) by mouth daily. 30 tablet 0  . potassium chloride SA (K-DUR) 20 MEQ tablet Dissolve in a small amount of water (may take 2 min to dissolve) then add to a small amount of apple sauce or pudding and eat right away 30 tablet 3  . rosuvastatin (CRESTOR) 5 MG tablet TAKE 1 TABLET BY MOUTH  EVERY DAY IN THE EVENING 90 tablet 1  . sertraline (ZOLOFT) 100 MG tablet Take 200 mg by mouth at bedtime. Take 2 tabs at bedtime     No current facility-administered medications on file prior to visit.     Past Medical History:  Diagnosis Date  . Anemia   . Anxiety   . Arthritis   . Cancer (HCC)    hx of skin cancer , colon   . Cataract    removed both eyes   . Chronic back pain    okay with sitting, increased back pain with activity   . Depression   . GERD (gastroesophageal reflux disease)   . Glaucoma   . Headache(784.0)    hx of  . History of kidney stones   . History of migraines   . Hyperlipidemia   . Hypertension   . Macular degeneration   . Myasthenia gravis ([place])   . Occasional tremors   . Pneumonia   . Spinal stenosis   . Thoracic aneurysm    4.4cm; see CT done 01/28/12 and 01/22/13 in [facility].   [name] Uses hearing aid   . Vitamin D deficiency     Past Surgical History:  Procedure Laterality Date  . ABDOMINAL HYSTERECTOMY    . CHOLECYSTECTOMY    . COLON SURGERY     Right colectomy dr. gross 03-17-18  . HIP ARTHROPLASTY Left 03/11/2019   Procedure: ARTHROPLASTY  BIPOLAR HIP (HEMIARTHROPLASTY);  Surgeon: Renette Butters, MD;  Location: [facility];  Service: Orthopedics;  Laterality: Left;  . LUMBAR LAMINECTOMY/DECOMPRESSION MICRODISCECTOMY N/A 02/04/2013   Procedure: CENTRAL DECOMPRESSION/LUMBAR LAMINECTOMY L3-L4 AND L4-L5 2 LEVELS;  Surgeon: Tobi Bastos, MD;  Location: [facility];  Service: Orthopedics;  Laterality: N/A;  . SPINAL CORD STIMULATOR IMPLANT     in right hip but not currently using because it did not help  . TONSILLECTOMY    . UPPER GASTROINTESTINAL ENDOSCOPY    .  US ECHOCARDIOGRAPHY  05/18/2007   EF 55-60%    Social History   Socioeconomic History  . Marital status: Married    Spouse name: Not on file  . Number of children: 3  . Years of education: Not on file  . Highest education level: Not on file  Occupational History  . Occupation: retired  Scientific laboratory technician  . Financial resource strain: Not hard at all  . Food insecurity    Worry: Never true    Inability: Never true  . Transportation needs    Medical: No    Non-medical: No  Tobacco Use  . Smoking status: Never Smoker  . Smokeless tobacco: Never Used  Substance and Sexual Activity  . Alcohol use: No  . Drug use: No  . Sexual activity: Not Currently  Lifestyle  . Physical activity    Days per week: 0 days    Minutes per session: 0 min  . Stress: Rather much  Relationships  . Social connections    Talks on phone: More than three times a week    Gets together: Once a week    Attends religious service: 1 to 4 times per year    Active member of club or organization: Yes    Attends meetings of clubs or organizations: More than 4 times per year    Relationship status: Married  Other Topics Concern  . Not on file  Social History Narrative   Lives with husband in a one story home.  Has 2 sons.  Retired.  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  Education: college.  [employer].    Family History  Problem Relation Age of Onset  . Heart attack  Father   . Diabetes Father   . Cancer Sister   . Breast cancer Sister   . Cancer Brother   . Lung cancer Brother   . Cancer Sister   . Liver cancer Sister   . Diabetes Sister   . Colon polyps Neg Hx   . Colon cancer Neg Hx   . Esophageal cancer Neg Hx   . Rectal cancer Neg Hx   . Stomach cancer Neg Hx   . Adrenal disorder Neg Hx     Review of Systems  Constitutional: Positive for appetite change (decreased) and fatigue. Negative for chills and fever.  Respiratory: Positive for cough (dry, occ) and shortness of breath (DOE). Negative for wheezing.   Cardiovascular: Positive for leg swelling. Negative for chest pain and palpitations.  Gastrointestinal: Positive for diarrhea and nausea. Negative for abdominal pain and blood in stool.       No gerd  Genitourinary:       Incontinence  Neurological: Positive for headaches (occ). Negative for dizziness.  Psychiatric/Behavioral: Positive for dysphoric mood. Negative for sleep disturbance.       Objective:   Vitals:   06/29/19 1442  BP: 110/62  Pulse: 75  Resp: 17  Temp: 98.8 F (37.1 C)  SpO2: 91%   BP Readings from Last 3 Encounters:  06/29/19 110/62  05/14/19 106/72  04/30/19 122/78   Wt Readings from Last 3 Encounters:  06/29/19 150 lb (68 kg)  04/30/19 148 lb (67.1 kg)  04/29/19 150 lb (68 kg)   Body mass index is 25.75 kg/m.   Physical Exam Constitutional:      General: She is not in acute distress.    Appearance: Normal appearance. She is not ill-appearing.  HENT:     Head: Normocephalic and  atraumatic.  Neck:     Musculoskeletal: Normal range of motion. No muscular tenderness.  Cardiovascular:     Rate and Rhythm: Normal rate and regular rhythm.  Pulmonary:     Effort: Pulmonary effort is normal.     Breath sounds: Normal breath sounds. No wheezing or rales.  Abdominal:     General: There is no distension.     Palpations: Abdomen is soft.     Tenderness: There is no abdominal tenderness.   Musculoskeletal:     Right lower leg: Edema (Trace) present.     Left lower leg: Edema (Trace) present.  Skin:    General: Skin is warm and dry.  Neurological:     Mental Status: She is alert.  Psychiatric:     Comments: Depressed affect            Assessment & Plan:    See Problem List for Assessment and Plan of chronic medical problems.

## 2019-06-29 ENCOUNTER — Other Ambulatory Visit (INDEPENDENT_AMBULATORY_CARE_PROVIDER_SITE_OTHER): Payer: Medicare Other

## 2019-06-29 ENCOUNTER — Encounter: Payer: Self-pay | Admitting: Internal Medicine

## 2019-06-29 ENCOUNTER — Other Ambulatory Visit: Payer: Self-pay

## 2019-06-29 ENCOUNTER — Ambulatory Visit (INDEPENDENT_AMBULATORY_CARE_PROVIDER_SITE_OTHER): Payer: Medicare Other | Admitting: Internal Medicine

## 2019-06-29 VITALS — BP 110/62 | HR 75 | Temp 98.8°F | Resp 17 | Wt 150.0 lb

## 2019-06-29 DIAGNOSIS — D649 Anemia, unspecified: Secondary | ICD-10-CM

## 2019-06-29 DIAGNOSIS — R739 Hyperglycemia, unspecified: Secondary | ICD-10-CM

## 2019-06-29 DIAGNOSIS — I10 Essential (primary) hypertension: Secondary | ICD-10-CM

## 2019-06-29 DIAGNOSIS — G479 Sleep disorder, unspecified: Secondary | ICD-10-CM | POA: Diagnosis not present

## 2019-06-29 DIAGNOSIS — R5381 Other malaise: Secondary | ICD-10-CM

## 2019-06-29 DIAGNOSIS — L659 Nonscarring hair loss, unspecified: Secondary | ICD-10-CM | POA: Diagnosis not present

## 2019-06-29 DIAGNOSIS — K219 Gastro-esophageal reflux disease without esophagitis: Secondary | ICD-10-CM

## 2019-06-29 DIAGNOSIS — R5383 Other fatigue: Secondary | ICD-10-CM

## 2019-06-29 DIAGNOSIS — N3946 Mixed incontinence: Secondary | ICD-10-CM

## 2019-06-29 DIAGNOSIS — F3289 Other specified depressive episodes: Secondary | ICD-10-CM

## 2019-06-29 DIAGNOSIS — C180 Malignant neoplasm of cecum: Secondary | ICD-10-CM

## 2019-06-29 LAB — COMPREHENSIVE METABOLIC PANEL
ALT: 10 U/L (ref 0–35)
AST: 16 U/L (ref 0–37)
Albumin: 4.1 g/dL (ref 3.5–5.2)
Alkaline Phosphatase: 61 U/L (ref 39–117)
BUN: 15 mg/dL (ref 6–23)
CO2: 29 mEq/L (ref 19–32)
Calcium: 10 mg/dL (ref 8.4–10.5)
Chloride: 99 mEq/L (ref 96–112)
Creatinine, Ser: 0.74 mg/dL (ref 0.40–1.20)
GFR: 73.33 mL/min (ref >60.00)
Glucose, Bld: 104 mg/dL — ABNORMAL HIGH (ref 70–99)
Potassium: 4.4 mEq/L (ref 3.5–5.1)
Sodium: 135 mEq/L (ref 135–145)
Total Bilirubin: 0.5 mg/dL (ref 0.2–1.2)
Total Protein: 6.6 g/dL (ref 6.0–8.3)

## 2019-06-29 LAB — CBC WITH DIFFERENTIAL/PLATELET
Basophils Absolute: 0 10*3/uL (ref 0.0–0.1)
Basophils Relative: 0.6 % (ref 0.0–3.0)
Eosinophils Absolute: 0.1 10*3/uL (ref 0.0–0.7)
Eosinophils Relative: 1.6 % (ref 0.0–5.0)
HCT: 29.4 % — ABNORMAL LOW (ref 36.0–46.0)
Hemoglobin: 9.5 g/dL — ABNORMAL LOW (ref 12.0–15.0)
Lymphocytes Relative: 16.4 % (ref 12.0–46.0)
Lymphs Abs: 1 10*3/uL (ref 0.7–4.0)
MCHC: 32.3 g/dL (ref 30.0–36.0)
MCV: 85.6 fl (ref 78.0–100.0)
Monocytes Absolute: 1 10*3/uL (ref 0.1–1.0)
Monocytes Relative: 17 % — ABNORMAL HIGH (ref 3.0–12.0)
Neutro Abs: 3.8 10*3/uL (ref 1.4–7.7)
Neutrophils Relative %: 64.4 % (ref 43.0–77.0)
Platelets: 276 10*3/uL (ref 150.0–400.0)
RBC: 3.43 Mil/uL — ABNORMAL LOW (ref 3.87–5.11)
RDW: 19.2 % — ABNORMAL HIGH (ref 11.5–15.5)
WBC: 5.8 10*3/uL (ref 4.0–10.5)

## 2019-06-29 LAB — VITAMIN B12: Vitamin B-12: 474 pg/mL (ref 211–911)

## 2019-06-29 LAB — TSH: TSH: 5.47 u[IU]/mL — ABNORMAL HIGH (ref 0.35–4.50)

## 2019-06-29 LAB — HEMOGLOBIN A1C: Hgb A1c MFr Bld: 6.1 % (ref 4.6–6.5)

## 2019-06-29 MED ORDER — MIRABEGRON ER 25 MG PO TB24: 25.0000 mg | ORAL_TABLET | Freq: Every day | ORAL | 1 refills | Status: DC

## 2019-06-29 NOTE — Assessment & Plan Note (Signed)
Having diarrhea-not taking Imodium daily, advised to take it daily Discussed possibly trying Metamucil Due for follow-up with Shirline Frees will schedule

## 2019-06-29 NOTE — Assessment & Plan Note (Signed)
Was on pantoprazole in the past-they states she is no longer taking this Denies GERD, but does have nausea with eating Discussed that this could be possibly atypical GERD and to consider trying the pantoprazole again We will schedule follow-up with GI and can discuss this with him as well

## 2019-06-29 NOTE — Assessment & Plan Note (Signed)
Has had diffuse hair loss May be related to stress We will check TSH, B12 She is taking multivitamins

## 2019-06-29 NOTE — Assessment & Plan Note (Addendum)
Taking tylenol Pm-no longer taking trazodone

## 2019-06-29 NOTE — Assessment & Plan Note (Signed)
Blood pressure controlled Continue current medication CMP

## 2019-06-29 NOTE — Assessment & Plan Note (Signed)
A1c

## 2019-06-29 NOTE — Patient Instructions (Addendum)
Dr Krist[facility]    Tests ordered today. Your results will be released to [place] (or called to you) after review.  If any changes need to be made, you will be notified at that same time.   Medications reviewed and updated.  Changes include :   Restart myrbetriq    Your prescription(s) have been submitted to your pharmacy. Please take as directed and contact our office if you believe you are having problem(s) with the medication(s).

## 2019-06-29 NOTE — Assessment & Plan Note (Signed)
Has had increased incontinence and they would like to try medication again She was on Myrbetriq in the past-we will restart 25 mg daily

## 2019-06-29 NOTE — Assessment & Plan Note (Signed)
Again discussed with her husband this is likely multifactorial Will check blood work Has chronic pain, uncontrolled depression, very sedentary lifestyle-all of which are likely contributing

## 2019-06-29 NOTE — Assessment & Plan Note (Signed)
Depression has not been controlled Sertraline is not effective, she has been on other medication in the past as well and they were not effective Discussed again that I think she should see a psychiatrist to help get her on the correct medication Name and number given

## 2019-06-29 NOTE — Assessment & Plan Note (Signed)
Was anemic after her surgeries This could be contributing to her fatigue as well Check CBC, iron panel

## 2019-06-30 ENCOUNTER — Encounter: Payer: Medicare Other | Attending: Physical Medicine & Rehabilitation | Admitting: Physical Medicine & Rehabilitation

## 2019-06-30 ENCOUNTER — Encounter: Payer: Self-pay | Admitting: Physical Medicine & Rehabilitation

## 2019-06-30 VITALS — BP 122/75 | HR 85 | Temp 98.7°F | Ht 60.0 in | Wt 150.0 lb

## 2019-06-30 DIAGNOSIS — G479 Sleep disorder, unspecified: Secondary | ICD-10-CM

## 2019-06-30 DIAGNOSIS — R269 Unspecified abnormalities of gait and mobility: Secondary | ICD-10-CM

## 2019-06-30 DIAGNOSIS — G7000 Myasthenia gravis without (acute) exacerbation: Secondary | ICD-10-CM | POA: Diagnosis not present

## 2019-06-30 DIAGNOSIS — S72002S Fracture of unspecified part of neck of left femur, sequela: Secondary | ICD-10-CM | POA: Diagnosis not present

## 2019-06-30 DIAGNOSIS — Z7409 Other reduced mobility: Secondary | ICD-10-CM | POA: Diagnosis not present

## 2019-06-30 DIAGNOSIS — R630 Anorexia: Secondary | ICD-10-CM

## 2019-06-30 DIAGNOSIS — S72002A Fracture of unspecified part of neck of left femur, initial encounter for closed fracture: Secondary | ICD-10-CM | POA: Insufficient documentation

## 2019-06-30 LAB — IRON,TIBC AND FERRITIN PANEL
%SAT: 7 % (calc) — ABNORMAL LOW (ref 16–45)
Ferritin: 19 ng/mL (ref 16–288)
Iron: 31 ug/dL — ABNORMAL LOW (ref 45–160)
TIBC: 426 mcg/dL (calc) (ref 250–450)

## 2019-06-30 NOTE — Progress Notes (Signed)
Subjective:    Patient ID: Tamara Gregory, female    DOB: 1925/11/26, 85 y.o.   MRN: [id number]   HPI Right-handed female with history of colon cancer with resection, chronic back pain followed by Dr. Maryjean Ka with spinal cord stimulator, hypertension, hyperlipidemia, ocular myasthenia gravis presents for follow-up after for left hip fracture status post hemiarthroplasty on 03/11/2019.  Husband supplements history. Last clinic visit 04/29/2019.  Since that time, she completed therapies, but is not doing HEP. She notes fatigue as limiting factor.  Husband notes very slow progress.  Denies pain.  Sleep has improved. States she is eating well, but husband does feel well. She recently decreased the prednisone.  Denies falls.  She was told she has CHF.  Pain Inventory Average Pain 0 Pain Right Now 0 My pain is na  In the last 24 hours, has pain interfered with the following? General activity 1 Relation with others 1 Enjoyment of life 1 What TIME of day is your pain at its worst? na Sleep (in general) Fair  Pain is worse with: na Pain improves with: na Relief from Meds: na  Mobility use a wheelchair needs help with transfers  Function retired  Neuro/Psych bladder control problems bowel control problems weakness numbness tingling trouble walking  Prior Studies Any changes since last visit?  no  Physicians involved in your care Any changes since last visit?  no   Family History  Problem Relation Age of Onset   Heart attack Father    Diabetes Father    Cancer Sister    Breast cancer Sister    Cancer Brother    Lung cancer Brother    Cancer Sister    Liver cancer Sister    Diabetes Sister    Colon polyps Neg Hx    Colon cancer Neg Hx    Esophageal cancer Neg Hx    Rectal cancer Neg Hx    Stomach cancer Neg Hx    Adrenal disorder Neg Hx    Social History   Socioeconomic History   Marital status: Married    Spouse name: Not on file   Number of  children: 3   Years of education: Not on file   Highest education level: Not on file  Occupational History   Occupation: retired  Scientist, product/process development strain: Not hard at all   Food insecurity    Worry: Never true    Inability: Never true   Transportation needs    Medical: No    Non-medical: No  Tobacco Use   Smoking status: Never Smoker   Smokeless tobacco: Never Used  Substance and Sexual Activity   Alcohol use: No   Drug use: No   Sexual activity: Not Currently  Lifestyle   Physical activity    Days per week: 0 days    Minutes per session: 0 min   Stress: Rather much  Relationships   Social connections    Talks on phone: More than three times a week    Gets together: Once a week    Attends religious service: 1 to 4 times per year    Active member of club or organization: Yes    Attends meetings of clubs or organizations: More than 4 times per year    Relationship status: Married  Other Topics Concern   Not on file  Social History Narrative   Lives with husband in a one story home.  Has 2 sons.  Retired.  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  Education: college.  [employer].   Past Surgical History:  Procedure Laterality Date   ABDOMINAL HYSTERECTOMY     CHOLECYSTECTOMY     COLON SURGERY     Right colectomy dr. gross 03-17-18   HIP ARTHROPLASTY Left 03/11/2019   Procedure: ARTHROPLASTY BIPOLAR HIP (HEMIARTHROPLASTY);  Surgeon: Renette Butters, MD;  Location: [facility];  Service: Orthopedics;  Laterality: Left;   LUMBAR LAMINECTOMY/DECOMPRESSION MICRODISCECTOMY N/A 02/04/2013   Procedure: CENTRAL DECOMPRESSION/LUMBAR LAMINECTOMY L3-L4 AND L4-L5 2 LEVELS;  Surgeon: Tobi Bastos, MD;  Location[facility];  Service: Orthopedics;  Laterality: N/A;   SPINAL CORD STIMULATOR IMPLANT     in right hip but not currently using because it did not help   TONSILLECTOMY     UPPER GASTROINTESTINAL ENDOSCOPY     US  ECHOCARDIOGRAPHY  05/18/2007   EF 55-60%   Past Medical History:  Diagnosis Date   Anemia    Anxiety    Arthritis    Cancer ([place])    hx of skin cancer , colon    Cataract    removed both eyes    Chronic back pain    okay with sitting, increased back pain with activity    Depression    GERD (gastroesophageal reflux disease)    Glaucoma    Headache(784.0)    hx of   History of kidney stones    History of migraines    Hyperlipidemia    Hypertension    Macular degeneration    Myasthenia gravis ([place])    Occasional tremors    Pneumonia    Spinal stenosis    Thoracic aneurysm    4.4cm; see CT done 01/28/12 and 01/22/13 in [facility].    Uses hearing aid    Vitamin D deficiency    BP 122/75    Pulse 85    Temp 98.7 F (37.1 C)    Ht 5' (1.524 m)    Wt 150 lb (68 kg)    SpO2 91%    BMI 29.29 kg/m   Opioid Risk Score:   Fall Risk Score:  `1  Depression screen PHQ 2/9  Depression screen [facility] 2/9 01/27/2019 11/19/2017 03/19/2017 05/06/2016  Decreased Interest 3 3 3  0  Down, Depressed, Hopeless 3 2 2  0  PHQ - 2 Score 6 5 5  0  Altered sleeping 3 2 1  -  Tired, decreased energy 3 3 3  -  Change in appetite 1 1 1  -  Feeling bad or failure about yourself  2 1 0 -  Trouble concentrating 1 0 0 -  Moving slowly or fidgety/restless 0 0 0 -  Suicidal thoughts 0 0 0 -  PHQ-9 Score 16 12 10  -  Difficult doing work/chores Somewhat difficult Somewhat difficult - -    Review of Systems  Constitutional: Negative.   HENT: Negative.   Eyes: Negative.   Respiratory: Positive for shortness of breath.   Cardiovascular: Negative.   Gastrointestinal: Negative.   Endocrine: Negative.   Genitourinary: Positive for difficulty urinating.  Musculoskeletal: Positive for gait problem.  Skin: Negative.   Allergic/Immunologic: Negative.   Neurological: Positive for tremors, weakness and numbness.  Hematological: Negative.   Psychiatric/Behavioral: Positive for dysphoric mood. The  patient is nervous/anxious.   All other systems reviewed and are negative.     Objective:   Physical Exam Constitutional: No distress . Vital signs reviewed. HENT: Normocephalic. Atraumatic.  Eyes: EOMI. No discharge. Cardiovascular: No JVD. Respiratory: Normal  effort. GI: Non-distended. Musc: No edema or tenderness in extremities. Neurological: Alert  HOH Makes good eye contact with examiner.  Motor:  RLE 4-4+/5 prox to distal  LLE 4-4+/5 HF, KE, ADF/PF  Skin: Vascular changes distal left lower extremity. Psychiatric: Normal mood.  Normal affect.    Assessment & Plan:  Right-handed female with history of colon cancer with resection, chronic back pain followed by Dr. Maryjean Ka with spinal cord stimulator, hypertension, hyperlipidemia, ocular myasthenia gravis presents for follow up for left hip fracture status post hemiarthroplasty on 03/11/2019.  1. Decreased functional mobility secondary to left displaced femoral neck fracture .Status post bipolar hip hemiarthroplasty on 03/11/2019.  Encouraged HEP  Referral for therapies  Endurance most limiting factor at present, multifactorial - MG, steroids, near diagnosis CHF, poor appetite, sleep disturbance, and age related changes  2. Pain Management/chronic back pain:   Improved  Cont tylenol PRN  3. Poor appetite  Encourage supplementation again  Educated on dietary intake - disagreement between patient and husband regarding intake  4. History of ocular myasthenia gravis.   Cont chronic prednisone - consider follow up with Neurology  5. Poor sleep  Sleep during the day and has difficulty at night  Recommended reorientation of sleep/waker cycle  Could also be confounded by poor oxygenation  6. CHF  Cont follow up with Cards   7. Gait abnormality  Referral for therapies  Cont walker/wheelchair for safety  >25 minutes spent with patient and husband with >20 minutes in counseling regarding fatigue, endurance, sleep, diet, etc

## 2019-07-01 ENCOUNTER — Telehealth: Payer: Self-pay | Admitting: Emergency Medicine

## 2019-07-01 ENCOUNTER — Encounter: Payer: Self-pay | Admitting: Emergency Medicine

## 2019-07-01 DIAGNOSIS — E039 Hypothyroidism, unspecified: Secondary | ICD-10-CM | POA: Insufficient documentation

## 2019-07-01 DIAGNOSIS — D649 Anemia, unspecified: Secondary | ICD-10-CM

## 2019-07-01 MED ORDER — LEVOTHYROXINE SODIUM 25 MCG PO TABS: 25.0000 ug | ORAL_TABLET | Freq: Every day | ORAL | 5 refills | Status: DC

## 2019-07-01 NOTE — Telephone Encounter (Signed)
Labs signed.  Levothyroxine sent to [facility]-not sure if they wanted it sent to [facility] or not.

## 2019-07-01 NOTE — Telephone Encounter (Signed)
Referral to hematology ordered.

## 2019-07-01 NOTE — Telephone Encounter (Signed)
Labs pending, please advise on thyroid med dose.

## 2019-07-01 NOTE — Telephone Encounter (Signed)
Spoke with patient's husband who states they were unable to get the effervescent tablets but he is following pharmacists advice and is dissolving regular tablets and putting them in yogurt. Patient's repeat K+ on 7/28 was 4.4 mEq/L. I advised husband to continue current plan and he thanked me for calling him.

## 2019-07-01 NOTE — Telephone Encounter (Signed)
Spoke with husband and info given.

## 2019-07-01 NOTE — Addendum Note (Signed)
Addended by: Binnie Rail on: 07/01/2019 03:29 PM   Modules accepted: Orders

## 2019-07-05 DIAGNOSIS — H401131 Primary open-angle glaucoma, bilateral, mild stage: Secondary | ICD-10-CM | POA: Diagnosis not present

## 2019-07-07 DIAGNOSIS — S72002D Fracture of unspecified part of neck of left femur, subsequent encounter for closed fracture with routine healing: Secondary | ICD-10-CM | POA: Diagnosis not present

## 2019-07-14 ENCOUNTER — Telehealth: Payer: Self-pay | Admitting: Hematology

## 2019-07-14 DIAGNOSIS — H524 Presbyopia: Secondary | ICD-10-CM | POA: Diagnosis not present

## 2019-07-14 DIAGNOSIS — H52203 Unspecified astigmatism, bilateral: Secondary | ICD-10-CM | POA: Diagnosis not present

## 2019-07-14 NOTE — Telephone Encounter (Signed)
Received a hem referral from Dr. Quay Burow for anemia. I cld and spoke to Mrs. Homes' husband and scheduled her to see Dr. Burr Medico on 8/18 at 1030am with an arrival time 15 minutes early. Mr. Holcomb has been made aware of our no visitor policy and that a RN escort will be provided for his wife. I also made a note for the Dr. Burr Medico to call Mr. Homles while his wife is in the exam room.

## 2019-07-19 ENCOUNTER — Encounter: Payer: Self-pay | Admitting: Physical Therapy

## 2019-07-19 ENCOUNTER — Other Ambulatory Visit: Payer: Self-pay

## 2019-07-19 ENCOUNTER — Ambulatory Visit: Payer: Medicare Other | Attending: Physical Medicine & Rehabilitation | Admitting: Physical Therapy

## 2019-07-19 DIAGNOSIS — M6281 Muscle weakness (generalized): Secondary | ICD-10-CM

## 2019-07-19 DIAGNOSIS — S72002A Fracture of unspecified part of neck of left femur, initial encounter for closed fracture: Secondary | ICD-10-CM | POA: Insufficient documentation

## 2019-07-19 DIAGNOSIS — R262 Difficulty in walking, not elsewhere classified: Secondary | ICD-10-CM | POA: Diagnosis not present

## 2019-07-19 DIAGNOSIS — R2689 Other abnormalities of gait and mobility: Secondary | ICD-10-CM | POA: Insufficient documentation

## 2019-07-19 NOTE — Progress Notes (Signed)
[place]   [phone] Fax:([phone]) [place] Note   Patient Care Team: Binnie Rail, MD as PCP - General (Internal Medicine) Nahser, Wonda Cheng, MD as PCP - Cardiology (Cardiology) Michael [place], MD as Consulting Physician (General Surgery) Milus Banister, MD as Attending Physician (Gastroenterology) Nahser, Wonda Cheng, MD as Consulting Physician (Cardiology) Alda Berthold, DO as Consulting Physician (Neurology) Feliz Beam, MD as Referring Physician (Ophthalmology)  Date of Service:  07/20/2019   CHIEF COMPLAINTS/PURPOSE OF CONSULTATION:  Anemia   REFERRING PHYSICIAN:  Dr. Quay Burow   HISTORY OF PRESENTING ILLNESS:  Tamara Gregory 85 y.o. female is a here because of anemia. The patient was referred by her PCP Dr. Quay Burow. The patient presents to the clinic today alone. She notes she is not the best historian about her medical history. She denies memory issues.   She is not sure how long she has been anemic. Per Epic she has been anemic for 2-3 years. She has not had IV iron or blood transfusion. She notes knowing this started after having a heart attack, but not sure when this occurred. Per her husband, she has been on oral iron for 2-3 weeks.   Today the patient notes  Feeling tired all the time and she feels depressed since her mobility has decreased. She broke her hip 2 months ago. S/p surgery she ambulated with a walker. Before fracture she still was not able to take care of herself independently. Her husband mainly takes care of her and knows her medical history.   Socially she is married and has 3 adult children who do not live near her. She does not drink alcohol and is a non-smoker. They have a PMHx of colon and skin cancer. She had colectomy and skin removal and did not need chemo. One of her sisters had breast cancer. She has chronic pain, had a pain pump placed before. She had back and hip surgery.    REVIEW OF SYSTEMS:    Constitutional: Denies fevers, chills or abnormal night sweats Eyes: Denies blurriness of vision, double vision or watery eyes Ears, nose, mouth, throat, and face: Denies mucositis or sore throat Respiratory: Denies cough, dyspnea or wheezes Cardiovascular: Denies palpitation, chest discomfort or lower extremity swelling Gastrointestinal:  Denies nausea, heartburn or change in bowel habits Skin: Denies abnormal skin rashes Lymphatics: Denies new lymphadenopathy or easy bruising Neurological:Denies numbness, tingling or new weaknesses Behavioral/Psych: Mood is stable, no new changes  All other systems were reviewed with the patient and are negative.   MEDICAL HISTORY:  Past Medical History:  Diagnosis Date  . Anemia   . Anxiety   . Arthritis   . Cancer (HCC)    hx of skin cancer , colon   . Cataract    removed both eyes   . Chronic back pain    okay with sitting, increased back pain with activity   . Depression   . GERD (gastroesophageal reflux disease)   . Glaucoma   . Headache(784.0)    hx of  . History of kidney stones   . History of migraines   . Hyperlipidemia   . Hypertension   . Macular degeneration   . Myasthenia gravis ([place])   . Occasional tremors   . Pneumonia   . Spinal stenosis   . Thoracic aneurysm    4.4cm; see CT done 01/28/12 and 01/22/13 in EPIC.   [name] Uses hearing aid   . Vitamin D  deficiency     SURGICAL HISTORY: Past Surgical History:  Procedure Laterality Date  . ABDOMINAL HYSTERECTOMY    . CHOLECYSTECTOMY    . COLON SURGERY     Right colectomy dr. gross 03-17-18  . HIP ARTHROPLASTY Left 03/11/2019   Procedure: ARTHROPLASTY BIPOLAR HIP (HEMIARTHROPLASTY);  Surgeon: Renette Butters, MD;  Location: [facility];  Service: Orthopedics;  Laterality: Left;  . LUMBAR LAMINECTOMY/DECOMPRESSION MICRODISCECTOMY N/A 02/04/2013   Procedure: CENTRAL DECOMPRESSION/LUMBAR LAMINECTOMY L3-L4 AND L4-L5 2 LEVELS;  Surgeon: Tobi Bastos, MD;  Location: [facility];  Service:  Orthopedics;  Laterality: N/A;  . SPINAL CORD STIMULATOR IMPLANT     in right hip but not currently using because it did not help  . TONSILLECTOMY    . UPPER GASTROINTESTINAL ENDOSCOPY    . US ECHOCARDIOGRAPHY  05/18/2007   EF 55-60%    SOCIAL HISTORY: Social History   Socioeconomic History  . Marital status: Married    Spouse name: Not on file  . Number of children: 3  . Years of education: Not on file  . Highest education level: Not on file  Occupational History  . Occupation: retired  Scientific laboratory technician  . Financial resource strain: Not hard at all  . Food insecurity    Worry: Never true    Inability: Never true  . Transportation needs    Medical: No    Non-medical: No  Tobacco Use  . Smoking status: Never Smoker  . Smokeless tobacco: Never Used  Substance and Sexual Activity  . Alcohol use: No  . Drug use: No  . Sexual activity: Not Currently  Lifestyle  . Physical activity    Days per week: 0 days    Minutes per session: 0 min  . Stress: Rather much  Relationships  . Social connections    Talks on phone: More than three times a week    Gets together: Once a week    Attends religious service: 1 to 4 times per year    Active member of club or organization: Yes    Attends meetings of clubs or organizations: More than 4 times per year    Relationship status: Married  . Intimate partner violence    Fear of current or ex partner: No    Emotionally abused: No    Physically abused: No    Forced sexual activity: No  Other Topics Concern  . Not on file  Social History Narrative   Lives with husband in a one story home.  Has 2 sons.  Retired.  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  Education: college.  [employer].    FAMILY HISTORY: Family History  Problem Relation Age of Onset  . Heart attack Father   . Diabetes Father   . Cancer Sister   . Breast cancer Sister   . Cancer Brother   . Lung cancer Brother   . Cancer Sister   . Liver  cancer Sister   . Diabetes Sister   . Colon polyps Neg Hx   . Colon cancer Neg Hx   . Esophageal cancer Neg Hx   . Rectal cancer Neg Hx   . Stomach cancer Neg Hx   . Adrenal disorder Neg Hx     ALLERGIES:  is allergic to tramadol.  MEDICATIONS:  Current Outpatient Medications  Medication Sig Dispense Refill  . acetaminophen (TYLENOL) 325 MG tablet Take 2 tablets (650 mg total) by mouth every 6 (six) hours as needed for  headache.    [name] aspirin 81 MG chewable tablet Chew 0.5 tablets (40.5 mg total) by mouth daily. 30 tablet 0  . brimonidine (ALPHAGAN) 0.2 % ophthalmic solution Place 1 drop into both eyes 3 (three) times daily.     . Calcium Carbonate (CALCIUM 500 PO) Take 500 mg by mouth daily.     . Cholecalciferol (VITAMIN D) 125 MCG (5000 UT) CAPS Take 5,000 Units by mouth daily.     . fluticasone (FLONASE) 50 MCG/ACT nasal spray Place 1 spray into both nostrils at bedtime.    . furosemide (LASIX) 20 MG tablet Take 2 tablets (40 mg total) by mouth daily. 90 tablet 3  . latanoprost (XALATAN) 0.005 % ophthalmic solution Place 1 drop into the left eye at bedtime.  1  . levothyroxine (SYNTHROID) 25 MCG tablet Take 1 tablet (25 mcg total) by mouth daily before breakfast. 30 tablet 5  . losartan (COZAAR) 25 MG tablet Take 1 tablet (25 mg total) by mouth daily. 90 tablet 3  . mirabegron ER (MYRBETRIQ) 25 MG TB24 tablet Take 1 tablet (25 mg total) by mouth daily. 90 tablet 1  . Multiple Vitamins-Minerals (PRESERVISION AREDS) CAPS Take 1 capsule by mouth 2 (two) times daily.     . Omega-3 Fatty Acids (FISH OIL) 435 MG CAPS Take 435 mg by mouth daily.    . potassium chloride SA (K-DUR) 20 MEQ tablet Dissolve in a small amount of water (may take 2 min to dissolve) then add to a small amount of apple sauce or pudding and eat right away 30 tablet 3  . rosuvastatin (CRESTOR) 5 MG tablet TAKE 1 TABLET BY MOUTH  EVERY DAY IN THE EVENING 90 tablet 1  . sertraline (ZOLOFT) 100 MG tablet Take 200 mg by  mouth at bedtime. Take 2 tabs at bedtime     No current facility-administered medications for this visit.     PHYSICAL EXAMINATION: ECOG PERFORMANCE STATUS: 2 - Symptomatic, <50% confined to bed  Vitals:   07/20/19 1051  BP: 107/68  Pulse: 82  Resp: 18  Temp: 98.5 F (36.9 C)  SpO2: 94%   Filed Weights   07/20/19 1051  Weight: 143 lb 9.6 oz (65.1 kg)    GENERAL:alert, no distress and comfortable SKIN: skin color, texture, turgor are normal, no rashes or significant lesions EYES: normal, Conjunctiva are pink and non-injected, sclera clear  NECK: supple, thyroid normal size, non-tender, without nodularity LYMPH:  no palpable lymphadenopathy in the cervical, axillary  LUNGS: clear to auscultation and percussion with normal breathing effort HEART: regular rate & rhythm and no murmurs and no lower extremity edema ABDOMEN:abdomen soft, non-tender and normal bowel sounds Musculoskeletal:no cyanosis of digits and no clubbing  NEURO: alert & oriented x 3 with fluent speech, no focal motor/sensory deficits  LABORATORY DATA:  I have reviewed the data as listed CBC Latest Ref Rng & Units 07/20/2019 06/29/2019 04/30/2019  WBC 4.0 - 10.5 K/uL 7.4 5.8 6.6  Hemoglobin 12.0 - 15.0 g/dL 11.0(L) 9.5(L) 9.8(L)  Hematocrit 36.0 - 46.0 % 37.1 29.4(L) 31.7(L)  Platelets 150 - 400 K/uL 259 276.0 276    CMP Latest Ref Rng & Units 07/20/2019 06/29/2019 06/22/2019  Glucose 70 - 99 mg/dL 124(H) 104(H) 163(H)  BUN 8 - 23 mg/dL 17 15 14   Creatinine 0.44 - 1.00 mg/dL 0.77 0.74 0.76  Sodium 135 - 145 mmol/L 141 135 136  Potassium 3.5 - 5.1 mmol/L 4.4 4.4 3.3(L)  Chloride 98 - 111 mmol/L 104 99  92(L)  CO2 22 - 32 mmol/L 26 29 25   Calcium 8.9 - 10.3 mg/dL 10.0 10.0 10.0  Total Protein 6.5 - 8.1 g/dL 7.3 6.6 -  Total Bilirubin 0.3 - 1.2 mg/dL 0.4 0.5 -  Alkaline Phos 38 - 126 U/L 68 61 -  AST 15 - 41 U/L 22 16 -  ALT 0 - 44 U/L 17 10 -    Results for DANIAH, ZALDIVAR (MRN [id number]) as of 07/19/2019  08:24  Ref. Range 06/29/2019 15:37  Iron Latest Ref Range: 45 - 160 mcg/dL 31 (L)  TIBC Latest Ref Range: 250 - 450 mcg/dL (calc) 426  %SAT Latest Ref Range: 16 - 45 % (calc) 7 (L)  Ferritin Latest Ref Range: 16 - 288 ng/mL 19      RADIOGRAPHIC STUDIES: I have personally reviewed the radiological images as listed and agreed with the findings in the report. No results found.  ASSESSMENT & PLAN:  Tamara Gregory is a 85 y.o. caucasian female with a history of Chronic back pain, HLD, HTN, myasthenia gravis, H/o skin cancer and colon cancer.    1. Iron deficient Anemia  -I reviewed her labs in epic records. She has had hg in 8-11 range for the past 2 years. Her CBC was normal in 10/2016. Normal MCV. Ferritin was low at 8.1 in 12/2017, and 19 in 06/2019 with low serum iron and saturation. She has lab evidence of iron deficiency. B12 was normal in 06/2019.  -she was diagnosed with right colon cancer in 03/2018, her iron deficient anemia is likely related to her colon cancer. However since her colon surgery, her anemia has not improved much, but she did not start iron replacement until a few weeks ago. -I recommend oral iron ferous sulfate BID. I recommend she take it with orange juice or vitC to improve absorption, and she may need stool softener as this may cause constipation.  -Will obtain labs today and in 4-6 weeks. If she does not respond to oral iron well, I will recommend IV iron for more direct intervention.  -I will also refer her back to Dr. Edison Nasuti for colonoscopy surveillance, she is overdue since surgery 03/2018, especially with persistent iron deficient anemia -If her anemia does not resolve after adequate iron replacement, I would consider additional anemia work-up. -F/u in 4-6 weeks    2. H/o right colon cancer in 03/2018, pT3N0Mo stage IIA -Diagnosed in 03/2018, invasive moderate differentiated adenocarcinoma. She is s/p hemicolectomy on 03/18/19.  -Given early stage cancer she did not  need adjuvant chemo.  -I will refer her back to Dr. Ardis Hughs for evaluation to make sure her anemia is not related to her colon cancer and surgery.    3. H/o skin cancer  -She was diagnosed with Squamous cell carcinoma of left forearm in 10/2016, of right foot in 04/2018 and of upper back in 12/2018. All were removed.  -She will continue to f/u with her dermatologist   4. Chronic back pain  -She had pain pump placed before.  -Pain is controlled, she denies being in pain currently.   5. Social Support  -She does not take care of herself independently. She lives with her husband who takes care of her and is her primary historian.  -She broke her left hip in 03/2019 and had surgery. She now ambulates with walker   PLAN:  -Labs today  -increase oral iron ferrous sulfate to 2 tablets daily -I will contact Dr. Ardis Hughs for repeated colonoscopy  -Lab  and f/u in 4-6 weeks, may consider iv iron if does not respond well to oral iron    Orders Placed This Encounter  Procedures  . CBC with Differential (Cancer Center Only)    Standing Status:   Standing    Number of Occurrences:   100    Standing Expiration Date:   07/19/2024  . Retic Panel    Standing Status:   Standing    Number of Occurrences:   100    Standing Expiration Date:   07/19/2024  . CMP ([facility] only)    Standing Status:   Standing    Number of Occurrences:   100    Standing Expiration Date:   07/19/2024  . Ferritin    Standing Status:   Standing    Number of Occurrences:   100    Standing Expiration Date:   07/19/2024  . Iron and TIBC    Standing Status:   Standing    Number of Occurrences:   100    Standing Expiration Date:   07/19/2024  . CEA (IN HOUSE-[facility])    Standing Status:   Standing    Number of Occurrences:   100    Standing Expiration Date:   07/19/2024    All questions were answered. The patient knows to call the clinic with any problems, questions or concerns. I spent 25 minutes counseling the patient  face to face. The total time spent in the appointment was 30 minutes and more than 50% was on counseling.     Truitt Merle, MD 07/20/2019 8:49 PM  I, Joslyn Devon, am acting as scribe for Truitt Merle, MD.   I have reviewed the above documentation for accuracy and completeness, and I agree with the above.

## 2019-07-19 NOTE — Therapy (Signed)
[place], [place], [address] Phone: [phone]   Fax:  [phone]  Physical Therapy Evaluation  Patient Details  Name: Tamara Gregory MRN: [id number] Date of Birth: November 20, 1925 Referring Provider (PT): Jamse Arn MD   Encounter Date: 07/19/2019  PT End of Session - 07/19/19 1752    Visit Number  1    Number of Visits  13    Date for PT Re-Evaluation  09/17/19    Authorization Type  U[employer]    PT Start Time  1500    PT Stop Time  5573    PT Time Calculation (min)  48 min    Equipment Utilized During Treatment  Gait belt    Activity Tolerance  Patient tolerated treatment well    Behavior During Therapy  [facility] for tasks assessed/performed       Past Medical History:  Diagnosis Date  . Anemia   . Anxiety   . Arthritis   . Cancer (HCC)    hx of skin cancer , colon   . Cataract    removed both eyes   . Chronic back pain    okay with sitting, increased back pain with activity   . Depression   . GERD (gastroesophageal reflux disease)   . Glaucoma   . Headache(784.0)    hx of  . History of kidney stones   . History of migraines   . Hyperlipidemia   . Hypertension   . Macular degeneration   . Myasthenia gravis ([place])   . Occasional tremors   . Pneumonia   . Spinal stenosis   . Thoracic aneurysm    4.4cm; see CT done 01/28/12 and 01/22/13 in [facility].   [name] Uses hearing aid   . Vitamin D deficiency     Past Surgical History:  Procedure Laterality Date  . ABDOMINAL HYSTERECTOMY    . CHOLECYSTECTOMY    . COLON SURGERY     Right colectomy dr. gross 03-17-18  . HIP ARTHROPLASTY Left 03/11/2019   Procedure: ARTHROPLASTY BIPOLAR HIP (HEMIARTHROPLASTY);  Surgeon: Renette Butters, MD;  Location: [place];  Service: Orthopedics;  Laterality: Left;  . LUMBAR LAMINECTOMY/DECOMPRESSION MICRODISCECTOMY N/A 02/04/2013   Procedure: CENTRAL DECOMPRESSION/LUMBAR LAMINECTOMY L3-L4 AND L4-L5 2 LEVELS;  Surgeon: Tobi Bastos, MD;  [facility];  Service: Orthopedics;  Laterality: N/A;  . SPINAL CORD STIMULATOR IMPLANT     in right hip but not currently using because it did not help  . TONSILLECTOMY    . UPPER GASTROINTESTINAL ENDOSCOPY    . US ECHOCARDIOGRAPHY  05/18/2007   EF 55-60%    There were no vitals filed for this visit.   Subjective Assessment - 07/19/19 1510    Subjective  Pt arriving today with her husband for PT evaluation following L hip surgery in April. Pt's husband reporting that she has trouble standing and transferrring with ther walker. Pt reporting that she trouble standing erect. Pt reporting over the last few days she is begining to walk short distances with her rollator walker.    Patient is accompained by:  Family member    Pertinent History  Ocular Myasthenia Gravis, HTN, PAC, CHF, OA, hip fx, leukocytosi, MI, anemia tachycardia, macular degeneration, spinal stenosis, GERD, colon CA surgery,    How long can you walk comfortably?  3 minutes with rollator walker    Diagnostic tests  X-ray following surgery    Patient Stated Goals  Walk better  Currently in Pain?  Yes    Pain Score  0-No pain    Aggravating Factors   Pt reporting when using the restroom her back pain increases to 4/10. Pt reporting chronic history of LBP    Pain Relieving Factors  siting, resting    Multiple Pain Sites  No         [facility] PT Assessment - 07/19/19 0001      Assessment   Medical Diagnosis  L hip fx, difficulty walking    Referring Provider (PT)  Jamse Arn MD    Onset Date/Surgical Date  03/05/19   pt's spouse says this is just a guess   Hand Dominance  Right    Next MD Visit  Pt reports she was released by surgeon    Prior Therapy  yes, HHPT following surgery in April       Precautions   Precautions  None      Restrictions   Weight Bearing Restrictions  No      Balance Screen   Has the patient fallen in the past 6 months  Yes    How many times?  1    Is the patient  reluctant to leave their home because of a fear of falling?   Yes      [place] residence    Living Arrangements  Spouse/significant other    Type of [place]  One level    [place] - 2 wheels;Walker - 4 wheels;Shower seat;Wheelchair - manual      Prior Function   Level of Independence  Needs assistance with ADLs    Comments  husband helps with bathing and dressing      Cognition   Overall Cognitive Status  Within Functional Limits for tasks assessed      Observation/Other Assessments   Focus on Therapeutic Outcomes (FOTO)   differed due to multiple co-morbidities      ROM / Strength   AROM / PROM / Strength  AROM;Strength      AROM   Overall AROM Comments  mild limitation in L hip adduction and abduction    AROM Assessment Site  Hip      Strength   Overall Strength  Deficits    Strength Assessment Site  Hip    Right/Left Hip  Right;Left    Right Hip Flexion  4/5    Right Hip ABduction  4/5    Right Hip ADduction  4/5    Left Hip Flexion  3/5    Left Hip ABduction  3/5    Left Hip ADduction  3/5      Palpation   Palpation comment  TTP lateral left hip      Ambulation/Gait   Gait Comments  Pt amb 5 feet with minimal assistance with no device. Pt did not bring her rollator walker to therpay today. Pt with decreased foot clearance and increased trunk flexion and knee felxion bilaterally.       Standardized Balance Assessment   Standardized Balance Assessment  Berg Balance Test      Berg Balance Test   Sit to Stand  Able to stand  independently using hands    Standing Unsupported  Able to stand 2 minutes with supervision    Sitting with Back Unsupported but Feet Supported on Floor or Stool  Able to sit safely and  securely 2 minutes    Stand to Sit  Uses backs of legs against chair to control descent    Transfers  Able to transfer with verbal cueing and /or supervision     Standing Unsupported with Eyes Closed  Unable to keep eyes closed 3 seconds but stays steady    Standing Unsupported with Feet Together  Needs help to attain position and unable to hold for 15 seconds    From Standing, Reach Forward with Outstretched Arm  Loses balance while trying/requires external support    From Standing Position, Pick up Object from Floor  Unable to try/needs assist to keep balance    From Standing Position, Turn to Look Behind Over each Shoulder  Needs assist to keep from losing balance and falling    Turn 360 Degrees  Needs assistance while turning    Standing Unsupported, Alternately Place Feet on Step/Stool  Needs assistance to keep from falling or unable to try    Standing Unsupported, One Foot in [employer] balance while stepping or standing    Standing on One Leg  Unable to try or needs assist to prevent fall    Total Score  15                Objective measurements completed on examination: See above findings.              PT Education - 07/19/19 1752    Education Details  HEP,    Person(s) Educated  Patient;Spouse    Methods  Explanation;Demonstration;Handout    Comprehension  Verbalized understanding;Returned demonstration;Verbal cues required          PT Long Term Goals - 07/19/19 1808      PT LONG TERM GOAL #1   Title  Pt will be independent in her HEP and progression.    Time  8    Period  Weeks    Status  New    Target Date  09/13/19      PT LONG TERM GOAL #2   Title  Pt will improve BERG balance score to > 30/56 for improved mobility (07/19/2019)    Period  Weeks    Status  New    Target Date  09/13/19      PT LONG TERM GOAL #3   Title  Pt will improve her L LE strength to >/= 4/5 in order to improve gait and functional mobility. (07/19/2019)    Time  8    Period  Weeks    Status  New    Target Date  09/13/19      PT LONG TERM GOAL #4   Title  Pt will be able to ambulate > 300' with rollator walker modified  independent for improved mobility (07/19/2019)    Time  8    Period  Weeks    Status  New    Target Date  09/13/19      PT LONG TERM GOAL #5   Title  -             Plan - 07/19/19 1812    Clinical Impression Statement  Pt presenting with ther husband for PT evaluation following L hip fracture, s/p repair in April 2020. Pt with increased risk of falls with BERG balance score of 15/56. Pt arriving in wheel chair provided by clinic. Pt reporting she only is able to walk short distances with her rollator walker. Pt requiring minmal assistance for amb 5 feet with decreased  hip flexion, knee flexion and foot clearance. Pt with multiple cor-morbidities. Pt's L LE grossly 3/5 compared to R LE grossly 4/5. Skilled PT needed to address pt's impairments with the below interventions.    Personal Factors and Comorbidities  Age;Comorbidity 3+    Comorbidities  myasthenia gravis ocular, HTN, CHF, GERD, spinal stenosis, OA, hip fxx, leukocytosis, anemia, tachycardia, urinary incontience    Examination-Activity Limitations  Bathing;Dressing;Transfers;Stand    Examination-Participation Restrictions  Community Activity    Stability/Clinical Decision Making  Stable/Uncomplicated    Clinical Decision Making  Moderate    Rehab Potential  Fair    PT Frequency  2x / week    PT Duration  6 weeks    PT Treatment/Interventions  ADLs/Self Care Home Management;Moist Heat;Gait training;Functional mobility training;Therapeutic activities;Therapeutic exercise;Balance training;Patient/family education;Neuromuscular re-education;Manual techniques;Passive range of motion    PT Next Visit Plan  LE strengtheing, gait using rollator/RW    PT Home Exercise Plan  see pt instructions    Consulted and Agree with Plan of Care  Patient       Patient will benefit from skilled therapeutic intervention in order to improve the following deficits and impairments:  Abnormal gait, Difficulty walking, Cardiopulmonary status limiting  activity, Pain, Decreased activity tolerance, Decreased strength, Decreased mobility  Visit Diagnosis: 1. Difficulty in walking, not elsewhere classified   2. Muscle weakness (generalized)   3. Other abnormalities of gait and mobility        Problem List Patient Active Problem List   Diagnosis Date Noted  . Hypothyroidism 07/01/2019  . Closed fracture of left hip ([place]) 06/30/2019  . Hair loss 06/29/2019  . Acute on chronic systolic CHF (congestive heart failure) ([place]) 05/14/2019  . Sleep disturbance 04/29/2019  . Postoperative pain   . Tachycardia   . Steroid-induced hyperglycemia   . Labile blood pressure   . Leukocytosis   . E. coli UTI   . Hypoalbuminemia due to protein-calorie malnutrition ([place])   . Ocular myasthenia gravis ([place])   . Anemia   . Hip fracture ([place]) 03/10/2019  . Left displaced femoral neck fracture ([place]) 03/10/2019  . Weakness 12/04/2018  . Hypokalemia 03/18/2018  . 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 Right ovarian cyst s/p RSO 03/17/2018  . Diarrhea 12/23/2017  . Large hiatal hernia 04/01/2017  . Sweating profusely 03/19/2017  . Hyperglycemia 03/19/2017  . Squamous cell skin cancer 11/05/2016  . Nosebleed 10/19/2016  . Osteopenia 08/28/2016  . Essential hypertension, benign 08/21/2016  . Long term current use of systemic steroids 08/21/2016  . GERD (gastroesophageal reflux disease) 05/06/2016  . Mixed incontinence urge and stress 05/06/2016  . Insomnia 07/26/2014  . Malaise and fatigue 10/04/2013  . PAC (premature atrial contraction) 10/04/2013  . Depression 02/15/2013  . Spinal stenosis, lumbar region, with neurogenic claudication 02/04/2013  . Myasthenia gravis ([place]) 03/17/2012  . Pure hypercholesterolemia 09/05/2011  . Benign hypertensive heart disease without heart failure 09/05/2011  . Osteoarthritis 09/05/2011    Oretha Caprice, PT 07/19/2019, 6:18 PM  [facility] [address] [place], [place], [address] Phone: [phone]   Fax:  [phone]  Name: Tamara Gregory MRN: [id number] Date of Birth: 1925/03/03

## 2019-07-20 ENCOUNTER — Inpatient Hospital Stay: Payer: Medicare Other | Attending: Hematology | Admitting: Hematology

## 2019-07-20 ENCOUNTER — Encounter: Payer: Self-pay | Admitting: Hematology

## 2019-07-20 ENCOUNTER — Other Ambulatory Visit: Payer: Self-pay

## 2019-07-20 ENCOUNTER — Inpatient Hospital Stay: Payer: Medicare Other

## 2019-07-20 VITALS — BP 107/68 | HR 82 | Temp 98.5°F | Resp 18 | Ht 60.0 in | Wt 143.6 lb

## 2019-07-20 DIAGNOSIS — D509 Iron deficiency anemia, unspecified: Secondary | ICD-10-CM | POA: Insufficient documentation

## 2019-07-20 DIAGNOSIS — C180 Malignant neoplasm of cecum: Secondary | ICD-10-CM

## 2019-07-20 DIAGNOSIS — G8929 Other chronic pain: Secondary | ICD-10-CM | POA: Insufficient documentation

## 2019-07-20 DIAGNOSIS — D500 Iron deficiency anemia secondary to blood loss (chronic): Secondary | ICD-10-CM

## 2019-07-20 DIAGNOSIS — Z801 Family history of malignant neoplasm of trachea, bronchus and lung: Secondary | ICD-10-CM | POA: Diagnosis not present

## 2019-07-20 DIAGNOSIS — Z85828 Personal history of other malignant neoplasm of skin: Secondary | ICD-10-CM

## 2019-07-20 DIAGNOSIS — Z808 Family history of malignant neoplasm of other organs or systems: Secondary | ICD-10-CM | POA: Insufficient documentation

## 2019-07-20 DIAGNOSIS — Z85038 Personal history of other malignant neoplasm of large intestine: Secondary | ICD-10-CM | POA: Insufficient documentation

## 2019-07-20 DIAGNOSIS — Z803 Family history of malignant neoplasm of breast: Secondary | ICD-10-CM | POA: Insufficient documentation

## 2019-07-20 DIAGNOSIS — Z9049 Acquired absence of other specified parts of digestive tract: Secondary | ICD-10-CM | POA: Diagnosis not present

## 2019-07-20 LAB — IRON AND TIBC
Iron: 197 ug/dL — ABNORMAL HIGH (ref 41–142)
Saturation Ratios: 41 % (ref 21–57)
TIBC: 479 ug/dL — ABNORMAL HIGH (ref 236–444)
UIBC: 282 ug/dL (ref 120–384)

## 2019-07-20 LAB — CBC WITH DIFFERENTIAL (CANCER CENTER ONLY)
Abs Immature Granulocytes: 0.04 10*3/uL (ref 0.00–0.07)
Basophils Absolute: 0 10*3/uL (ref 0.0–0.1)
Basophils Relative: 1 %
Eosinophils Absolute: 0.1 10*3/uL (ref 0.0–0.5)
Eosinophils Relative: 1 %
HCT: 37.1 % (ref 36.0–46.0)
Hemoglobin: 11 g/dL — ABNORMAL LOW (ref 12.0–15.0)
Immature Granulocytes: 1 %
Lymphocytes Relative: 10 %
Lymphs Abs: 0.7 10*3/uL (ref 0.7–4.0)
MCH: 27.6 pg (ref 26.0–34.0)
MCHC: 29.6 g/dL — ABNORMAL LOW (ref 30.0–36.0)
MCV: 93 fL (ref 80.0–100.0)
Monocytes Absolute: 0.7 10*3/uL (ref 0.1–1.0)
Monocytes Relative: 10 %
Neutro Abs: 5.9 10*3/uL (ref 1.7–7.7)
Neutrophils Relative %: 77 %
Platelet Count: 259 10*3/uL (ref 150–400)
RBC: 3.99 MIL/uL (ref 3.87–5.11)
RDW: 19 % — ABNORMAL HIGH (ref 11.5–15.5)
WBC Count: 7.4 10*3/uL (ref 4.0–10.5)
nRBC: 0 % (ref 0.0–0.2)

## 2019-07-20 LAB — CMP (CANCER CENTER ONLY)
ALT: 17 U/L (ref 0–44)
AST: 22 U/L (ref 15–41)
Albumin: 4.5 g/dL (ref 3.5–5.0)
Alkaline Phosphatase: 68 U/L (ref 38–126)
Anion gap: 11 (ref 5–15)
BUN: 17 mg/dL (ref 8–23)
CO2: 26 mmol/L (ref 22–32)
Calcium: 10 mg/dL (ref 8.9–10.3)
Chloride: 104 mmol/L (ref 98–111)
Creatinine: 0.77 mg/dL (ref 0.44–1.00)
GFR, Est AFR Am: >60 mL/min (ref >60)
GFR, Estimated: >60 mL/min (ref >60)
Glucose, Bld: 124 mg/dL — ABNORMAL HIGH (ref 70–99)
Potassium: 4.4 mmol/L (ref 3.5–5.1)
Sodium: 141 mmol/L (ref 135–145)
Total Bilirubin: 0.4 mg/dL (ref 0.3–1.2)
Total Protein: 7.3 g/dL (ref 6.5–8.1)

## 2019-07-20 LAB — RETIC PANEL
Immature Retic Fract: 17.8 % — ABNORMAL HIGH (ref 2.3–15.9)
RBC.: 3.91 MIL/uL (ref 3.87–5.11)
Retic Count, Absolute: 91.5 10*3/uL (ref 19.0–186.0)
Retic Ct Pct: 2.3 % (ref 0.4–3.1)
Reticulocyte Hemoglobin: 34.9 pg (ref >27.9)

## 2019-07-20 LAB — FERRITIN: Ferritin: 32 ng/mL (ref 11–307)

## 2019-07-20 LAB — CEA (IN HOUSE-CHCC): CEA (CHCC-In House): 7.48 ng/mL — ABNORMAL HIGH (ref 0.00–5.00)

## 2019-07-21 ENCOUNTER — Telehealth: Payer: Self-pay | Admitting: Hematology

## 2019-07-21 NOTE — Telephone Encounter (Signed)
Scheduled appt per 8/18 los.  Spoke with patient spouse and they are aware of the appt date and time.

## 2019-07-25 ENCOUNTER — Other Ambulatory Visit: Payer: Self-pay | Admitting: Hematology

## 2019-07-25 DIAGNOSIS — C180 Malignant neoplasm of cecum: Secondary | ICD-10-CM

## 2019-07-26 ENCOUNTER — Telehealth: Payer: Self-pay

## 2019-07-26 NOTE — Telephone Encounter (Signed)
Contacted patient, spoke with spouse, made aware of results, plan, and next appt scheduled. He verbalized understanding and had no other questions or concerns.

## 2019-07-26 NOTE — Telephone Encounter (Signed)
-----   Message from Truitt Merle, MD sent at 07/25/2019 10:49 PM EDT ----- My nurse,   Please let pt know her lab results from last week, anemia improved, iron level better, her tumor marker CEA was slightly elevated. I will repeat it next time, if still elevated and trending up, will get CT scan to rule out her colon cancer recurrence. Thanks   Truitt Merle  07/25/2019

## 2019-07-27 ENCOUNTER — Other Ambulatory Visit: Payer: Self-pay

## 2019-07-27 ENCOUNTER — Ambulatory Visit: Payer: Medicare Other | Admitting: Physical Therapy

## 2019-07-27 DIAGNOSIS — R262 Difficulty in walking, not elsewhere classified: Secondary | ICD-10-CM

## 2019-07-27 DIAGNOSIS — S72002A Fracture of unspecified part of neck of left femur, initial encounter for closed fracture: Secondary | ICD-10-CM | POA: Diagnosis not present

## 2019-07-27 DIAGNOSIS — M6281 Muscle weakness (generalized): Secondary | ICD-10-CM

## 2019-07-27 DIAGNOSIS — R2689 Other abnormalities of gait and mobility: Secondary | ICD-10-CM

## 2019-07-27 NOTE — Therapy (Signed)
[place], [place], [address] Phone: [phone]   Fax:  [phone]  Physical Therapy Treatment  Patient Details  Name: Tamara Gregory MRN: [id number] Date of Birth: 1925/03/14 Referring Provider (PT): Jamse Arn MD   Encounter Date: 07/27/2019  PT End of Session - 07/27/19 0949    Visit Number  2    Number of Visits  13    Date for PT Re-Evaluation  09/17/19    Authorization Type  UHC    PT Start Time  [id number]    PT Stop Time  1012    PT Time Calculation (min)  39 min       Past Medical History:  Diagnosis Date  . Anemia   . Anxiety   . Arthritis   . Cancer (HCC)    hx of skin cancer , colon   . Cataract    removed both eyes   . Chronic back pain    okay with sitting, increased back pain with activity   . Depression   . GERD (gastroesophageal reflux disease)   . Glaucoma   . Headache(784.0)    hx of  . History of kidney stones   . History of migraines   . Hyperlipidemia   . Hypertension   . Macular degeneration   . Myasthenia gravis ([place])   . Occasional tremors   . Pneumonia   . Spinal stenosis   . Thoracic aneurysm    4.4cm; see CT done 01/28/12 and 01/22/13 in [facility].   [name] Uses hearing aid   . Vitamin D deficiency     Past Surgical History:  Procedure Laterality Date  . ABDOMINAL HYSTERECTOMY    . CHOLECYSTECTOMY    . COLON SURGERY     Right colectomy dr. gross 03-17-18  . HIP ARTHROPLASTY Left 03/11/2019   Procedure: ARTHROPLASTY BIPOLAR HIP (HEMIARTHROPLASTY);  Surgeon: Renette Butters, MD;  Location: [place];  Service: Orthopedics;  Laterality: Left;  . LUMBAR LAMINECTOMY/DECOMPRESSION MICRODISCECTOMY N/A 02/04/2013   Procedure: CENTRAL DECOMPRESSION/LUMBAR LAMINECTOMY L3-L4 AND L4-L5 2 LEVELS;  Surgeon: Tobi Bastos, MD;  [facility];  Service: Orthopedics;  Laterality: N/A;  . SPINAL CORD STIMULATOR IMPLANT     in right hip but not currently using because it did not  help  . TONSILLECTOMY    . UPPER GASTROINTESTINAL ENDOSCOPY    . US ECHOCARDIOGRAPHY  05/18/2007   EF 55-60%    There were no vitals filed for this visit.  Subjective Assessment - 07/27/19 0950    Subjective  I fell 2-3 days ago. Hit right knee and pulled my back on the right side. It was a gentle fall.    Currently in Pain?  No/denies                       [facility] Adult PT Treatment/Exercise - 07/27/19 0001      Ambulation/Gait   Ambulation/Gait  Yes    Ambulation Distance (Feet)  55 Feet    Assistive device  Rolling walker    Gait Comments  cues for erect trunk and increased step length       Knee/Hip Exercises: Standing   Heel Raises  10 reps    Heel Raises Limitations  2 sets     Hip Flexion Limitations  alternating march at RW x 10       Knee/Hip Exercises: Seated   Long Arc Quad  Right;Left;2 sets;10 reps  Ball Squeeze  10 x 2     Clamshell with TheraBand  Red   10 x 2   Marching Limitations  unilat 10 x 2 each    Hamstring Curl  10 reps;2 sets    Sit to Sand  5 reps   UE use to rise, no UE to lower     Knee/Hip Exercises: Supine   Bridges  2 sets;10 reps    Other Supine Knee/Hip Exercises  --      Knee/Hip Exercises: Sidelying   Hip ABduction  Both;10 reps    Hip ABduction Limitations  2 sets                   PT Long Term Goals - 07/19/19 1808      PT LONG TERM GOAL #1   Title  Pt will be independent in her HEP and progression.    Time  8    Period  Weeks    Status  New    Target Date  09/13/19      PT LONG TERM GOAL #2   Title  Pt will improve BERG balance score to > 30/56 for improved mobility (07/19/2019)    Period  Weeks    Status  New    Target Date  09/13/19      PT LONG TERM GOAL #3   Title  Pt will improve her L LE strength to >/= 4/5 in order to improve gait and functional mobility. (07/19/2019)    Time  8    Period  Weeks    Status  New    Target Date  09/13/19      PT LONG TERM GOAL #4   Title  Pt will be  able to ambulate > 300' with rollator walker modified independent for improved mobility (07/19/2019)    Time  8    Period  Weeks    Status  New    Target Date  09/13/19      PT LONG TERM GOAL #5   Title  -            Plan - 07/27/19 1017    Clinical Impression Statement  Pt arrives reporting recent fall however no injury or lingering pain. She reports compliance with HEP daily prior to rise in mornings. Reviewed HEP and progressed with LE strengthening in seated supine and standing. Gait tolerance 55 ft today prior to fatigue, RW and CGA. Needs cues for step through pattern and erect trunk during gait.    PT Next Visit Plan  LE strengtheing, gait using rollator/RW    PT Home Exercise Plan  seated hip flexion, supine bridge and SLR, side hip abduction       Patient will benefit from skilled therapeutic intervention in order to improve the following deficits and impairments:  Abnormal gait, Difficulty walking, Cardiopulmonary status limiting activity, Pain, Decreased activity tolerance, Decreased strength, Decreased mobility  Visit Diagnosis: Difficulty in walking, not elsewhere classified  Muscle weakness (generalized)  Other abnormalities of gait and mobility  Left displaced femoral neck fracture [facility])     Problem List Patient Active Problem List   Diagnosis Date Noted  . Hypothyroidism 07/01/2019  . Closed fracture of left hip ([employer]) 06/30/2019  . Hair loss 06/29/2019  . Acute on chronic systolic CHF (congestive heart failure) ([place]) 05/14/2019  . Sleep disturbance 04/29/2019  . Postoperative pain   . Tachycardia   . Steroid-induced hyperglycemia   . Labile blood pressure   .  Leukocytosis   . E. coli UTI   . Hypoalbuminemia due to protein-calorie malnutrition ([place])   . Ocular myasthenia gravis ([place])   . Anemia   . Hip fracture ([place]) 03/10/2019  . Left displaced femoral neck fracture ([place]) 03/10/2019  . Weakness 12/04/2018  . Hypokalemia 03/18/2018  . Cancer of  cecum s/p robotic right colectomy 03/17/2018 03/17/2018  . Right ovarian cyst s/p RSO 03/17/2018  . Diarrhea 12/23/2017  . Large hiatal hernia 04/01/2017  . Sweating profusely 03/19/2017  . Hyperglycemia 03/19/2017  . Squamous cell skin cancer 11/05/2016  . Nosebleed 10/19/2016  . Osteopenia 08/28/2016  . Essential hypertension, benign 08/21/2016  . Long term current use of systemic steroids 08/21/2016  . Primary open angle glaucoma of both eyes, mild stage 05/14/2016  . Ocular hypertension of left eye 05/08/2016  . GERD (gastroesophageal reflux disease) 05/06/2016  . Macular pucker, bilateral 11/14/2015  . Status post intraocular lens implant 04/04/2015  . Exudative age-related macular degeneration ([place]) 04/04/2015  . Chronic pain syndrome 02/07/2015  . Bilateral nonexudative age-related macular degeneration 01/16/2015  . Presence of intraocular lens 01/16/2015  . Insomnia 07/26/2014  . Urge incontinence of urine 01/17/2014  . Urinary urgency 01/17/2014  . Malaise and fatigue 10/04/2013  . PAC (premature atrial contraction) 10/04/2013  . Depression 02/15/2013  . Myasthenia gravis ([place]) 03/17/2012  . Spinal stenosis, lumbar 01/14/2012  . Ptosis of eyelid 01/14/2012  . Pure hypercholesterolemia 09/05/2011  . Benign hypertensive heart disease without heart failure 09/05/2011  . Osteoarthritis 09/05/2011    Dorene Ar, PTA 07/27/2019, 10:21 AM  [facility] [address] [place], [place], [address] Phone: [phone]   Fax:  [phone]  Name: STEPHANEE SCLAFANI MRN: [id number] Date of Birth: July 02, 1925

## 2019-07-29 ENCOUNTER — Encounter: Payer: Self-pay | Admitting: Physical Therapy

## 2019-07-29 ENCOUNTER — Other Ambulatory Visit: Payer: Self-pay

## 2019-07-29 ENCOUNTER — Ambulatory Visit: Payer: Medicare Other | Admitting: Physical Therapy

## 2019-07-29 DIAGNOSIS — R2689 Other abnormalities of gait and mobility: Secondary | ICD-10-CM | POA: Diagnosis not present

## 2019-07-29 DIAGNOSIS — M6281 Muscle weakness (generalized): Secondary | ICD-10-CM

## 2019-07-29 DIAGNOSIS — S72002A Fracture of unspecified part of neck of left femur, initial encounter for closed fracture: Secondary | ICD-10-CM | POA: Diagnosis not present

## 2019-07-29 DIAGNOSIS — R262 Difficulty in walking, not elsewhere classified: Secondary | ICD-10-CM | POA: Diagnosis not present

## 2019-07-29 NOTE — Therapy (Signed)
[place], [place], [address] Phone: [phone]   Fax:  [phone]  Physical Therapy Treatment  Patient Details  Name: Tamara Gregory MRN: [id number] Date of Birth: Feb 28, 1925 Referring Provider (PT): Jamse Arn MD   Encounter Date: 07/29/2019  PT End of Session - 07/29/19 0856    Visit Number  3    Number of Visits  13    Date for PT Re-Evaluation  09/17/19    Authorization Type  UHC    PT Start Time  0853    PT Stop Time  0931    PT Time Calculation (min)  38 min    Activity Tolerance  Patient tolerated treatment well    Behavior During Therapy  [facility] for tasks assessed/performed       Past Medical History:  Diagnosis Date  . Anemia   . Anxiety   . Arthritis   . Cancer (HCC)    hx of skin cancer , colon   . Cataract    removed both eyes   . Chronic back pain    okay with sitting, increased back pain with activity   . Depression   . GERD (gastroesophageal reflux disease)   . Glaucoma   . Headache(784.0)    hx of  . History of kidney stones   . History of migraines   . Hyperlipidemia   . Hypertension   . Macular degeneration   . Myasthenia gravis ([place])   . Occasional tremors   . Pneumonia   . Spinal stenosis   . Thoracic aneurysm    4.4cm; see CT done 01/28/12 and 01/22/13 in EPIC.   [name] Uses hearing aid   . Vitamin D deficiency     Past Surgical History:  Procedure Laterality Date  . ABDOMINAL HYSTERECTOMY    . CHOLECYSTECTOMY    . COLON SURGERY     Right colectomy dr. gross 03-17-18  . HIP ARTHROPLASTY Left 03/11/2019   Procedure: ARTHROPLASTY BIPOLAR HIP (HEMIARTHROPLASTY);  Surgeon: Renette Butters, MD;  Location: [place];  Service: Orthopedics;  Laterality: Left;  . LUMBAR LAMINECTOMY/DECOMPRESSION MICRODISCECTOMY N/A 02/04/2013   Procedure: CENTRAL DECOMPRESSION/LUMBAR LAMINECTOMY L3-L4 AND L4-L5 2 LEVELS;  Surgeon: Tobi Bastos, MD;  [facility];  Service:  Orthopedics;  Laterality: N/A;  . SPINAL CORD STIMULATOR IMPLANT     in right hip but not currently using because it did not help  . TONSILLECTOMY    . UPPER GASTROINTESTINAL ENDOSCOPY    . US ECHOCARDIOGRAPHY  05/18/2007   EF 55-60%    There were no vitals filed for this visit.  Subjective Assessment - 07/29/19 0855    Subjective  No pain todya, the easiest thing to walk with is holding the back of the wheelchair.    Currently in Pain?  No/denies                       [facility] Adult PT Treatment/Exercise - 07/29/19 0001      Exercises   Exercises  Knee/Hip      Knee/Hip Exercises: Stretches   Hip Flexor Stretch Limitations  supine with leg slightly abducted, opp knee flexed      Knee/Hip Exercises: Standing   Gait Training  with RW and CGA to lobby from [facility] room    Other Standing Knee Exercises  using walker with UE movements, static holds and mini knee bends, tandem weight shift      Knee/Hip  Exercises: Sidelying   Hip ABduction Limitations  abduction + small extension    Clams  bilateral x 20                  PT Long Term Goals - 07/19/19 1808      PT LONG TERM GOAL #1   Title  Pt will be independent in her HEP and progression.    Time  8    Period  Weeks    Status  New    Target Date  09/13/19      PT LONG TERM GOAL #2   Title  Pt will improve BERG balance score to > 30/56 for improved mobility (07/19/2019)    Period  Weeks    Status  New    Target Date  09/13/19      PT LONG TERM GOAL #3   Title  Pt will improve her L LE strength to >/= 4/5 in order to improve gait and functional mobility. (07/19/2019)    Time  8    Period  Weeks    Status  New    Target Date  09/13/19      PT LONG TERM GOAL #4   Title  Pt will be able to ambulate > 300' with rollator walker modified independent for improved mobility (07/19/2019)    Time  8    Period  Weeks    Status  New    Target Date  09/13/19      PT LONG TERM GOAL #5   Title  -             Plan - 07/29/19 1032    Clinical Impression Statement  Periods of standing with exercises while feet were still. Frequent verbal and tactile cues reqired for upright psoture- pt notes that she tends to bend forward and will try to be more aware of this. Shaking in legs with fatigue.    PT Treatment/Interventions  ADLs/Self Care Home Management;Moist Heat;Gait training;Functional mobility training;Therapeutic activities;Therapeutic exercise;Balance training;Patient/family education;Neuromuscular re-education;Manual techniques;Passive range of motion    PT Next Visit Plan  gross LE strengthening, gait training    PT Home Exercise Plan  seated hip flexion, supine bridge and SLR, side hip abduction, upright posture in standing- stand every 30 min    Consulted and Agree with Plan of Care  Patient       Patient will benefit from skilled therapeutic intervention in order to improve the following deficits and impairments:  Abnormal gait, Difficulty walking, Cardiopulmonary status limiting activity, Pain, Decreased activity tolerance, Decreased strength, Decreased mobility  Visit Diagnosis: Difficulty in walking, not elsewhere classified  Muscle weakness (generalized)  Other abnormalities of gait and mobility  Left displaced femoral neck fracture [facility])     Problem List Patient Active Problem List   Diagnosis Date Noted  . Hypothyroidism 07/01/2019  . Closed fracture of left hip ([place]) 06/30/2019  . Hair loss 06/29/2019  . Acute on chronic systolic CHF (congestive heart failure) ([place]) 05/14/2019  . Sleep disturbance 04/29/2019  . Postoperative pain   . Tachycardia   . Steroid-induced hyperglycemia   . Labile blood pressure   . Leukocytosis   . E. coli UTI   . Hypoalbuminemia due to protein-calorie malnutrition ([place])   . Ocular myasthenia gravis ([place])   . Anemia   . Hip fracture ([place]) 03/10/2019  . Left displaced femoral neck fracture ([place]) 03/10/2019  . Weakness  12/04/2018  . Hypokalemia 03/18/2018  . Cancer of cecum s/p  robotic right colectomy 03/17/2018 03/17/2018  . Right ovarian cyst s/p RSO 03/17/2018  . Diarrhea 12/23/2017  . Large hiatal hernia 04/01/2017  . Sweating profusely 03/19/2017  . Hyperglycemia 03/19/2017  . Squamous cell skin cancer 11/05/2016  . Nosebleed 10/19/2016  . Osteopenia 08/28/2016  . Essential hypertension, benign 08/21/2016  . Long term current use of systemic steroids 08/21/2016  . Primary open angle glaucoma of both eyes, mild stage 05/14/2016  . Ocular hypertension of left eye 05/08/2016  . GERD (gastroesophageal reflux disease) 05/06/2016  . Macular pucker, bilateral 11/14/2015  . Status post intraocular lens implant 04/04/2015  . Exudative age-related macular degeneration ([place]) 04/04/2015  . Chronic pain syndrome 02/07/2015  . Bilateral nonexudative age-related macular degeneration 01/16/2015  . Presence of intraocular lens 01/16/2015  . Insomnia 07/26/2014  . Urge incontinence of urine 01/17/2014  . Urinary urgency 01/17/2014  . Malaise and fatigue 10/04/2013  . PAC (premature atrial contraction) 10/04/2013  . Depression 02/15/2013  . Myasthenia gravis ([place]) 03/17/2012  . Spinal stenosis, lumbar 01/14/2012  . Ptosis of eyelid 01/14/2012  . Pure hypercholesterolemia 09/05/2011  . Benign hypertensive heart disease without heart failure 09/05/2011  . Osteoarthritis 09/05/2011   Tamara Gregory PT, DPT 07/29/19 10:36 AM   [place] [facility] [address] [place], [place], [address] Phone: [phone]   Fax:  [phone]  Name: Tamara Gregory MRN: [id number] Date of Birth: 20-Apr-1925

## 2019-07-31 ENCOUNTER — Encounter: Payer: Self-pay | Admitting: Internal Medicine

## 2019-07-31 DIAGNOSIS — C44729 Squamous cell carcinoma of skin of left lower limb, including hip: Secondary | ICD-10-CM | POA: Insufficient documentation

## 2019-08-04 DIAGNOSIS — H353130 Nonexudative age-related macular degeneration, bilateral, stage unspecified: Secondary | ICD-10-CM | POA: Diagnosis not present

## 2019-08-04 DIAGNOSIS — H401131 Primary open-angle glaucoma, bilateral, mild stage: Secondary | ICD-10-CM | POA: Diagnosis not present

## 2019-08-04 DIAGNOSIS — H35373 Puckering of macula, bilateral: Secondary | ICD-10-CM | POA: Diagnosis not present

## 2019-08-04 DIAGNOSIS — Z961 Presence of intraocular lens: Secondary | ICD-10-CM | POA: Diagnosis not present

## 2019-08-04 DIAGNOSIS — G7000 Myasthenia gravis without (acute) exacerbation: Secondary | ICD-10-CM | POA: Diagnosis not present

## 2019-08-05 ENCOUNTER — Encounter: Payer: Self-pay | Admitting: Physical Therapy

## 2019-08-05 ENCOUNTER — Other Ambulatory Visit: Payer: Self-pay

## 2019-08-05 ENCOUNTER — Ambulatory Visit: Payer: Medicare Other | Attending: Physical Medicine & Rehabilitation | Admitting: Physical Therapy

## 2019-08-05 DIAGNOSIS — M6281 Muscle weakness (generalized): Secondary | ICD-10-CM | POA: Diagnosis not present

## 2019-08-05 DIAGNOSIS — S72002A Fracture of unspecified part of neck of left femur, initial encounter for closed fracture: Secondary | ICD-10-CM | POA: Insufficient documentation

## 2019-08-05 DIAGNOSIS — H401131 Primary open-angle glaucoma, bilateral, mild stage: Secondary | ICD-10-CM | POA: Diagnosis not present

## 2019-08-05 DIAGNOSIS — Z961 Presence of intraocular lens: Secondary | ICD-10-CM | POA: Diagnosis not present

## 2019-08-05 DIAGNOSIS — Z9842 Cataract extraction status, left eye: Secondary | ICD-10-CM | POA: Diagnosis not present

## 2019-08-05 DIAGNOSIS — R2689 Other abnormalities of gait and mobility: Secondary | ICD-10-CM | POA: Diagnosis not present

## 2019-08-05 DIAGNOSIS — Z9841 Cataract extraction status, right eye: Secondary | ICD-10-CM | POA: Diagnosis not present

## 2019-08-05 DIAGNOSIS — R262 Difficulty in walking, not elsewhere classified: Secondary | ICD-10-CM

## 2019-08-05 DIAGNOSIS — G7000 Myasthenia gravis without (acute) exacerbation: Secondary | ICD-10-CM | POA: Diagnosis not present

## 2019-08-05 DIAGNOSIS — H353130 Nonexudative age-related macular degeneration, bilateral, stage unspecified: Secondary | ICD-10-CM | POA: Diagnosis not present

## 2019-08-05 NOTE — Therapy (Signed)
[place], [place], [address] Phone: [phone]   Fax:  [phone]  Physical Therapy Treatment  Patient Details  Name: Tamara Gregory MRN: [id number] Date of Birth: October 02, 1925 Referring Provider (PT): Jamse Arn MD   Encounter Date: 08/05/2019  PT End of Session - 08/05/19 1108    Visit Number  4    Number of Visits  13    Date for PT Re-Evaluation  09/17/19    Authorization Type  UHC    PT Start Time  1103    PT Stop Time  1145    PT Time Calculation (min)  42 min       Past Medical History:  Diagnosis Date  . Anemia   . Anxiety   . Arthritis   . Cancer (HCC)    hx of skin cancer , colon   . Cataract    removed both eyes   . Chronic back pain    okay with sitting, increased back pain with activity   . Depression   . GERD (gastroesophageal reflux disease)   . Glaucoma   . Headache(784.0)    hx of  . History of kidney stones   . History of migraines   . Hyperlipidemia   . Hypertension   . Macular degeneration   . Myasthenia gravis ([place])   . Occasional tremors   . Pneumonia   . Spinal stenosis   . Thoracic aneurysm    4.4cm; see CT done 01/28/12 and 01/22/13 in [facility]IC.   [name] Uses hearing aid   . Vitamin D deficiency     Past Surgical History:  Procedure Laterality Date  . ABDOMINAL HYSTERECTOMY    . CHOLECYSTECTOMY    . COLON SURGERY     Right colectomy dr. gross 03-17-18  . HIP ARTHROPLASTY Left 03/11/2019   Procedure: ARTHROPLASTY BIPOLAR HIP (HEMIARTHROPLASTY);  Surgeon: Renette Butters, MD;  Location: [place];  Service: Orthopedics;  Laterality: Left;  . LUMBAR LAMINECTOMY/DECOMPRESSION MICRODISCECTOMY N/A 02/04/2013   Procedure: CENTRAL DECOMPRESSION/LUMBAR LAMINECTOMY L3-L4 AND L4-L5 2 LEVELS;  Surgeon: Tobi Bastos, MD;  [facility];  Service: Orthopedics;  Laterality: N/A;  . SPINAL CORD STIMULATOR IMPLANT     in right hip but not currently using because it did not  help  . TONSILLECTOMY    . UPPER GASTROINTESTINAL ENDOSCOPY    . US ECHOCARDIOGRAPHY  05/18/2007   EF 55-60%    There were no vitals filed for this visit.  Subjective Assessment - 08/05/19 1107    Subjective  No pain right now.    Currently in Pain?  No/denies                       [facility] Adult PT Treatment/Exercise - 08/05/19 0001      Neuro Re-ed    Neuro Re-ed Details   rhomberg 30 sec x 2, rhomberg with head turns, standard stance with trunk rotations all with CGA and no UE       Knee/Hip Exercises: Stretches   Hip Flexor Stretch Limitations  supine with leg slightly abducted, opp knee flexed      Knee/Hip Exercises: Standing   Heel Raises  10 reps    Heel Raises Limitations  2 sets    Hip Flexion Limitations  alternating march at RW x 10 x 2    Other Standing Knee Exercises  Rw in front static balance normal stance, then added forward raises  without touching RW x 3 then c/o lower back pain       Knee/Hip Exercises: Seated   Sit to Sand  --   6 reps , no UE to lower      Knee/Hip Exercises: Supine   Bridges  2 sets;10 reps    Straight Leg Raises  2 sets;10 reps    Other Supine Knee/Hip Exercises  ball squeeze x 20      Knee/Hip Exercises: Sidelying   Hip ABduction  Both;10 reps    Hip ABduction Limitations  2 sets     Clams  bilateral x 20                  PT Long Term Goals - 07/19/19 1808      PT LONG TERM GOAL #1   Title  Pt will be independent in her HEP and progression.    Time  8    Period  Weeks    Status  New    Target Date  09/13/19      PT LONG TERM GOAL #2   Title  Pt will improve BERG balance score to > 30/56 for improved mobility (07/19/2019)    Period  Weeks    Status  New    Target Date  09/13/19      PT LONG TERM GOAL #3   Title  Pt will improve her L LE strength to >/= 4/5 in order to improve gait and functional mobility. (07/19/2019)    Time  8    Period  Weeks    Status  New    Target Date  09/13/19       PT LONG TERM GOAL #4   Title  Pt will be able to ambulate > 300' with rollator walker modified independent for improved mobility (07/19/2019)    Time  8    Period  Weeks    Status  New    Target Date  09/13/19      PT LONG TERM GOAL #5   Title  -            Plan - 08/05/19 1148    Clinical Impression Statement  Shanel arrives in [facility] wearing a left eye patch. She reports left eye regression over last 2 weeks and was seen by optometrist yesterday who referred her to a specialist in [facility] today. Improvements in static standing balance such as rhomberg today noted since inital BERG assessment, She is compliant with HEP. Cues needed for gait including upright posture , glit activation and cues to keep feet apart.    Comorbidities  myasthenia gravis ocular, HTN, CHF, GERD, spinal stenosis, OA, hip fxx, leukocytosis, anemia, tachycardia, urinary incontience    PT Next Visit Plan  gross LE strengthening, gait training    PT Home Exercise Plan  seated hip flexion, supine bridge and SLR, side hip abduction, upright posture in standing- stand every 30 min       Patient will benefit from skilled therapeutic intervention in order to improve the following deficits and impairments:  Abnormal gait, Difficulty walking, Cardiopulmonary status limiting activity, Pain, Decreased activity tolerance, Decreased strength, Decreased mobility  Visit Diagnosis: Difficulty in walking, not elsewhere classified  Muscle weakness (generalized)  Other abnormalities of gait and mobility  Left displaced femoral neck fracture [facility])     Problem List Patient Active Problem List   Diagnosis Date Noted  . invasive SCC (squamous cell carcinoma), leg, left 07/31/2019  . Hypothyroidism 07/01/2019  .  Closed fracture of left hip ([place]) 06/30/2019  . Hair loss 06/29/2019  . Acute on chronic systolic CHF (congestive heart failure) ([place]) 05/14/2019  . Sleep disturbance 04/29/2019  . Postoperative pain   .  Tachycardia   . Steroid-induced hyperglycemia   . Labile blood pressure   . Leukocytosis   . E. coli UTI   . Hypoalbuminemia due to protein-calorie malnutrition ([place])   . Ocular myasthenia gravis ([place])   . Anemia   . Hip fracture ([place]) 03/10/2019  . Left displaced femoral neck fracture ([place]) 03/10/2019  . Weakness 12/04/2018  . Hypokalemia 03/18/2018  . 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 Right ovarian cyst s/p RSO 03/17/2018  . Diarrhea 12/23/2017  . Large hiatal hernia 04/01/2017  . Sweating profusely 03/19/2017  . Hyperglycemia 03/19/2017  . Squamous cell skin cancer 11/05/2016  . Nosebleed 10/19/2016  . Osteopenia 08/28/2016  . Essential hypertension, benign 08/21/2016  . Long term current use of systemic steroids 08/21/2016  . Primary open angle glaucoma of both eyes, mild stage 05/14/2016  . Ocular hypertension of left eye 05/08/2016  . GERD (gastroesophageal reflux disease) 05/06/2016  . Macular pucker, bilateral 11/14/2015  . Status post intraocular lens implant 04/04/2015  . Exudative age-related macular degeneration ([place]) 04/04/2015  . Chronic pain syndrome 02/07/2015  . Bilateral nonexudative age-related macular degeneration 01/16/2015  . Presence of intraocular lens 01/16/2015  . Insomnia 07/26/2014  . Urge incontinence of urine 01/17/2014  . Urinary urgency 01/17/2014  . Malaise and fatigue 10/04/2013  . PAC (premature atrial contraction) 10/04/2013  . Depression 02/15/2013  . Myasthenia gravis ([place]) 03/17/2012  . Spinal stenosis, lumbar 01/14/2012  . Ptosis of eyelid 01/14/2012  . Pure hypercholesterolemia 09/05/2011  . Benign hypertensive heart disease without heart failure 09/05/2011  . Osteoarthritis 09/05/2011    Dorene Ar, PTA 08/05/2019, 11:51 AM  [facility] [address] [place], [place], [address] Phone: [phone]   Fax:  [phone]  Name: LATARSHIA CHRISTMAS MRN: [id number] Date of Birth: May 14, 1925

## 2019-08-10 ENCOUNTER — Other Ambulatory Visit: Payer: Self-pay

## 2019-08-10 ENCOUNTER — Ambulatory Visit: Payer: Medicare Other | Admitting: Physical Therapy

## 2019-08-10 ENCOUNTER — Encounter: Payer: Self-pay | Admitting: Physical Therapy

## 2019-08-10 DIAGNOSIS — R262 Difficulty in walking, not elsewhere classified: Secondary | ICD-10-CM

## 2019-08-10 DIAGNOSIS — R2689 Other abnormalities of gait and mobility: Secondary | ICD-10-CM

## 2019-08-10 DIAGNOSIS — S72002A Fracture of unspecified part of neck of left femur, initial encounter for closed fracture: Secondary | ICD-10-CM | POA: Diagnosis not present

## 2019-08-10 DIAGNOSIS — M6281 Muscle weakness (generalized): Secondary | ICD-10-CM | POA: Diagnosis not present

## 2019-08-10 NOTE — Therapy (Signed)
[place], [place], [address] Phone: [phone]   Fax:  [phone]  Physical Therapy Treatment  Patient Details  Name: Tamara Gregory MRN: [id number] Date of Birth: [birth year] Referring Provider (PT): Jamse Arn MD   Encounter Date: 08/10/2019  PT End of Session - 08/10/19 1030    Visit Number  5    Number of Visits  13    Date for PT Re-Evaluation  09/17/19    Authorization Type  UHC    PT Start Time  1020    PT Stop Time  1100    PT Time Calculation (min)  40 min    Equipment Utilized During Treatment  Gait belt    Activity Tolerance  Patient tolerated treatment well    Behavior During Therapy  [facility] for tasks assessed/performed       Past Medical History:  Diagnosis Date  . Anemia   . Anxiety   . Arthritis   . Cancer (HCC)    hx of skin cancer , colon   . Cataract    removed both eyes   . Chronic back pain    okay with sitting, increased back pain with activity   . Depression   . GERD (gastroesophageal reflux disease)   . Glaucoma   . Headache(784.0)    hx of  . History of kidney stones   . History of migraines   . Hyperlipidemia   . Hypertension   . Macular degeneration   . Myasthenia gravis ([place])   . Occasional tremors   . Pneumonia   . Spinal stenosis   . Thoracic aneurysm    4.4cm; see CT done 01/28/12 and 01/22/13 in [facility].   [name] Uses hearing aid   . Vitamin D deficiency     Past Surgical History:  Procedure Laterality Date  . ABDOMINAL HYSTERECTOMY    . CHOLECYSTECTOMY    . COLON SURGERY     Right colectomy dr. gross 03-17-18  . HIP ARTHROPLASTY Left 03/11/2019   Procedure: ARTHROPLASTY BIPOLAR HIP (HEMIARTHROPLASTY);  Surgeon: Renette Butters, MD;  Location: [place];  Service: Orthopedics;  Laterality: Left;  . LUMBAR LAMINECTOMY/DECOMPRESSION MICRODISCECTOMY N/A 02/04/2013   Procedure: CENTRAL DECOMPRESSION/LUMBAR LAMINECTOMY L3-L4 AND L4-L5 2 LEVELS;  Surgeon: Tobi Bastos, MD;  [facility];  Service: Orthopedics;  Laterality: N/A;  . SPINAL CORD STIMULATOR IMPLANT     in right hip but not currently using because it did not help  . TONSILLECTOMY    . UPPER GASTROINTESTINAL ENDOSCOPY    . US ECHOCARDIOGRAPHY  05/18/2007   EF 55-60%    There were no vitals filed for this visit.  Subjective Assessment - 08/10/19 1029    Subjective  Pt reporting no pain upon arrival, Pt did however report she wasn't feeling well today. HR at 85 at rest and O2 sats 95%.    Pertinent History  Ocular Myasthenia Gravis, HTN, PAC, CHF, OA, hip fx, leukocytosi, MI, anemia tachycardia, macular degeneration, spinal stenosis, GERD, colon CA surgery,    Currently in Pain?  No/denies                       [facility] Adult PT Treatment/Exercise - 08/10/19 0001      Knee/Hip Exercises: Standing   Heel Raises  10 reps    Heel Raises Limitations  UE support    Hip Flexion  Stengthening;Both;10 reps    Hip Flexion Limitations  UE support      Knee/Hip Exercises: Supine   Bridges  2 sets;10 reps    Straight Leg Raises  2 sets;10 reps    Other Supine Knee/Hip Exercises  clams red theraband    Other Supine Knee/Hip Exercises  ball squeeze x 20      Knee/Hip Exercises: Sidelying   Hip ABduction  Both;2 sets;10 reps    Hip ABduction Limitations  2 sets                   PT Long Term Goals - 08/10/19 1038      PT LONG TERM GOAL #1   Title  Pt will be independent in her HEP and progression.    Time  8    Status  On-going      PT LONG TERM GOAL #2   Title  Pt will improve BERG balance score to > 30/56 for improved mobility (07/19/2019)    Time  8    Period  Weeks    Status  New      PT LONG TERM GOAL #3   Title  Pt will improve her L LE strength to >/= 4/5 in order to improve gait and functional mobility. (07/19/2019)    Time  8    Period  Weeks    Status  New      PT LONG TERM GOAL #4   Title  Pt will be able to ambulate > 300' with  rollator walker modified independent for improved mobility (07/19/2019)    Time  8    Period  Weeks            Plan - 08/10/19 1032    Clinical Impression Statement  Pt arriving in w/c wearing left eye patch. Pt reporting not feeling so well today. Pt's 02 sats ranging from 92% - 96% on RA. Pt tolerating all exercises with rest breaks between. More sitting and supine exercises due to pt's increased RR of 20-28 bpm. Continue with skilled PT as pt tolerates.    Personal Factors and Comorbidities  Age;Comorbidity 3+    Comorbidities  myasthenia gravis ocular, HTN, CHF, GERD, spinal stenosis, OA, hip fxx, leukocytosis, anemia, tachycardia, urinary incontience    Examination-Activity Limitations  Bathing;Dressing;Transfers;Stand    Examination-Participation Restrictions  Community Activity    Stability/Clinical Decision Making  Stable/Uncomplicated    Rehab Potential  Fair    PT Frequency  2x / week    PT Duration  6 weeks    PT Treatment/Interventions  ADLs/Self Care Home Management;Moist Heat;Gait training;Functional mobility training;Therapeutic activities;Therapeutic exercise;Balance training;Patient/family education;Neuromuscular re-education;Manual techniques;Passive range of motion    PT Next Visit Plan  gross LE strengthening, gait training    PT Home Exercise Plan  seated hip flexion, supine bridge and SLR, side hip abduction, upright posture in standing- stand every 30 min    Consulted and Agree with Plan of Care  Patient       Patient will benefit from skilled therapeutic intervention in order to improve the following deficits and impairments:  Abnormal gait, Difficulty walking, Cardiopulmonary status limiting activity, Pain, Decreased activity tolerance, Decreased strength, Decreased mobility  Visit Diagnosis: Muscle weakness (generalized)  Difficulty in walking, not elsewhere classified  Other abnormalities of gait and mobility  Left displaced femoral neck fracture  [facility])     Problem List Patient Active Problem List   Diagnosis Date Noted  . invasive SCC (squamous cell carcinoma), leg, left 07/31/2019  . Hypothyroidism 07/01/2019  .  Closed fracture of left hip ([place]) 06/30/2019  . Hair loss 06/29/2019  . Acute on chronic systolic CHF (congestive heart failure) ([place]) 05/14/2019  . Sleep disturbance 04/29/2019  . Postoperative pain   . Tachycardia   . Steroid-induced hyperglycemia   . Labile blood pressure   . Leukocytosis   . E. coli UTI   . Hypoalbuminemia due to protein-calorie malnutrition ([place])   . Ocular myasthenia gravis ([place])   . Anemia   . Hip fracture ([place]) 03/10/2019  . Left displaced femoral neck fracture ([place]) 03/10/2019  . Weakness 12/04/2018  . Hypokalemia 03/18/2018  . 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 Right ovarian cyst s/p RSO 03/17/2018  . Diarrhea 12/23/2017  . Large hiatal hernia 04/01/2017  . Sweating profusely 03/19/2017  . Hyperglycemia 03/19/2017  . Squamous cell skin cancer 11/05/2016  . Nosebleed 10/19/2016  . Osteopenia 08/28/2016  . Essential hypertension, benign 08/21/2016  . Long term current use of systemic steroids 08/21/2016  . Primary open angle glaucoma of both eyes, mild stage 05/14/2016  . Ocular hypertension of left eye 05/08/2016  . GERD (gastroesophageal reflux disease) 05/06/2016  . Macular pucker, bilateral 11/14/2015  . Status post intraocular lens implant 04/04/2015  . Exudative age-related macular degeneration ([place]) 04/04/2015  . Chronic pain syndrome 02/07/2015  . Bilateral nonexudative age-related macular degeneration 01/16/2015  . Presence of intraocular lens 01/16/2015  . Insomnia 07/26/2014  . Urge incontinence of urine 01/17/2014  . Urinary urgency 01/17/2014  . Malaise and fatigue 10/04/2013  . PAC (premature atrial contraction) 10/04/2013  . Depression 02/15/2013  . Myasthenia gravis ([place]) 03/17/2012  . Spinal stenosis, lumbar 01/14/2012  .  Ptosis of eyelid 01/14/2012  . Pure hypercholesterolemia 09/05/2011  . Benign hypertensive heart disease without heart failure 09/05/2011  . Osteoarthritis 09/05/2011    Oretha Caprice, PT 08/10/2019, 10:55 AM  [facility] [address] [place], [place], [address] Phone: [phone]   Fax:  [phone]  Name: FAVIOLA APPLEMAN MRN: [id number] Date of Birth: [birth year]

## 2019-08-12 ENCOUNTER — Other Ambulatory Visit: Payer: Self-pay

## 2019-08-12 ENCOUNTER — Encounter: Payer: Self-pay | Admitting: Physical Therapy

## 2019-08-12 ENCOUNTER — Ambulatory Visit: Payer: Medicare Other | Admitting: Physical Therapy

## 2019-08-12 DIAGNOSIS — R262 Difficulty in walking, not elsewhere classified: Secondary | ICD-10-CM

## 2019-08-12 DIAGNOSIS — S72002A Fracture of unspecified part of neck of left femur, initial encounter for closed fracture: Secondary | ICD-10-CM | POA: Diagnosis not present

## 2019-08-12 DIAGNOSIS — M6281 Muscle weakness (generalized): Secondary | ICD-10-CM

## 2019-08-12 DIAGNOSIS — R2689 Other abnormalities of gait and mobility: Secondary | ICD-10-CM

## 2019-08-12 NOTE — Therapy (Signed)
[place], [place], [address] Phone: [phone]   Fax:  [phone]  Physical Therapy Treatment  Patient Details  Name: Tamara Gregory MRN: [id number] Date of Birth: 03-Sep-1925 Referring Provider (PT): Jamse Arn MD   Encounter Date: 08/12/2019  PT End of Session - 08/12/19 1032    Visit Number  6    Number of Visits  13    Date for PT Re-Evaluation  09/17/19    Authorization Type  [employer]    PT Start Time  [id number]    PT Stop Time  1015    PT Time Calculation (min)  42 min       Past Medical History:  Diagnosis Date  . Anemia   . Anxiety   . Arthritis   . Cancer (HCC)    hx of skin cancer , colon   . Cataract    removed both eyes   . Chronic back pain    okay with sitting, increased back pain with activity   . Depression   . GERD (gastroesophageal reflux disease)   . Glaucoma   . Headache(784.0)    hx of  . History of kidney stones   . History of migraines   . Hyperlipidemia   . Hypertension   . Macular degeneration   . Myasthenia gravis ([place])   . Occasional tremors   . Pneumonia   . Spinal stenosis   . Thoracic aneurysm    4.4cm; see CT done 01/28/12 and 01/22/13 in EPIC.   [name] Uses hearing aid   . Vitamin D deficiency     Past Surgical History:  Procedure Laterality Date  . ABDOMINAL HYSTERECTOMY    . CHOLECYSTECTOMY    . COLON SURGERY     Right colectomy dr. gross 03-17-18  . HIP ARTHROPLASTY Left 03/11/2019   Procedure: ARTHROPLASTY BIPOLAR HIP (HEMIARTHROPLASTY);  Surgeon: Renette Butters, MD;  Location: [facility];  Service: Orthopedics;  Laterality: Left;  . LUMBAR LAMINECTOMY/DECOMPRESSION MICRODISCECTOMY N/A 02/04/2013   Procedure: CENTRAL DECOMPRESSION/LUMBAR LAMINECTOMY L3-L4 AND L4-L5 2 LEVELS;  Surgeon: Tobi Bastos, MD;  Location: [facility];  Service: Orthopedics;  Laterality: N/A;  . SPINAL CORD STIMULATOR IMPLANT     in right hip but not currently using because it did not  help  . TONSILLECTOMY    . UPPER GASTROINTESTINAL ENDOSCOPY    . US ECHOCARDIOGRAPHY  05/18/2007   EF 55-60%    There were no vitals filed for this visit.  Subjective Assessment - 08/12/19 0935    Subjective  I feel a little better.    Currently in Pain?  No/denies                       [facility] Adult PT Treatment/Exercise - 08/12/19 0001      Ambulation/Gait   Ambulation/Gait  Yes    Ambulation Distance (Feet)  75 Feet    Assistive device  Rolling walker    Gait Comments  cues for erect trunk and increased step length       Knee/Hip Exercises: Standing   Heel Raises  10 reps    Heel Raises Limitations  UE support    Hip Flexion  Stengthening;Both;10 reps    Hip Flexion Limitations  UE support    Hip Abduction  Stengthening;Both;10 reps    Abduction Limitations  UE support    Other Standing Knee Exercises  Tandem stance, narrow stance static  Knee/Hip Exercises: Seated   Long Arc Quad Limitations  30 reps with ball squeeze     Marching Limitations  10 x 3 marching alternating     Sit to Sand  10 reps   UE to rise, no UE to lower     Knee/Hip Exercises: Supine   Bridges with Diona Foley Squeeze  10 reps;2 sets    Bridges with Clamshell  10 reps;2 sets    Other Supine Knee/Hip Exercises  green band clams     Other Supine Knee/Hip Exercises  pelvic tilit             PT Education - 08/12/19 1224    Education Details  HEP    Person(s) Educated  Patient    Methods  Explanation;Handout    Comprehension  Verbalized understanding          PT Long Term Goals - 08/10/19 1038      PT LONG TERM GOAL #1   Title  Pt will be independent in her HEP and progression.    Time  8    Status  On-going      PT LONG TERM GOAL #2   Title  Pt will improve BERG balance score to > 30/56 for improved mobility (07/19/2019)    Time  8    Period  Weeks    Status  New      PT LONG TERM GOAL #3   Title  Pt will improve her L LE strength to >/= 4/5 in order to improve  gait and functional mobility. (07/19/2019)    Time  8    Period  Weeks    Status  New      PT LONG TERM GOAL #4   Title  Pt will be able to ambulate > 300' with rollator walker modified independent for improved mobility (07/19/2019)    Time  8    Period  Weeks            Plan - 08/12/19 1031    Clinical Impression Statement  Pt reports feeling a little better since last visit. She requires seated rest breaks due to SOB. SPO2 above 90% during treatment. Gait with RW for 75 feet today with cues for posture and step length. WC unavailable at end of session and she was helped via HHA from PTA and her husband to reach her the car at end of session.    PT Next Visit Plan  gait and balance, closed chain strength as tolerated    PT Home Exercise Plan  seated hip flexion, supine bridge and SLR, side hip abduction, upright posture in standing- stand every 30 min       Patient will benefit from skilled therapeutic intervention in order to improve the following deficits and impairments:  Abnormal gait, Difficulty walking, Cardiopulmonary status limiting activity, Pain, Decreased activity tolerance, Decreased strength, Decreased mobility  Visit Diagnosis: Muscle weakness (generalized)  Difficulty in walking, not elsewhere classified  Other abnormalities of gait and mobility  Left displaced femoral neck fracture [facility])     Problem List Patient Active Problem List   Diagnosis Date Noted  . invasive SCC (squamous cell carcinoma), leg, left 07/31/2019  . Hypothyroidism 07/01/2019  . Closed fracture of left hip ([place]) 06/30/2019  . Hair loss 06/29/2019  . Acute on chronic systolic CHF (congestive heart failure) ([place]) 05/14/2019  . Sleep disturbance 04/29/2019  . Postoperative pain   . Tachycardia   . Steroid-induced hyperglycemia   . Labile blood  pressure   . Leukocytosis   . E. coli UTI   . Hypoalbuminemia due to protein-calorie malnutrition ([place])   . Ocular myasthenia gravis ([place])    . Anemia   . Hip fracture ([place]) 03/10/2019  . Left displaced femoral neck fracture ([place]) 03/10/2019  . Weakness 12/04/2018  . Hypokalemia 03/18/2018  . 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 Right ovarian cyst s/p RSO 03/17/2018  . Diarrhea 12/23/2017  . Large hiatal hernia 04/01/2017  . Sweating profusely 03/19/2017  . Hyperglycemia 03/19/2017  . Squamous cell skin cancer 11/05/2016  . Nosebleed 10/19/2016  . Osteopenia 08/28/2016  . Essential hypertension, benign 08/21/2016  . Long term current use of systemic steroids 08/21/2016  . Primary open angle glaucoma of both eyes, mild stage 05/14/2016  . Ocular hypertension of left eye 05/08/2016  . GERD (gastroesophageal reflux disease) 05/06/2016  . Macular pucker, bilateral 11/14/2015  . Status post intraocular lens implant 04/04/2015  . Exudative age-related macular degeneration ([place]) 04/04/2015  . Chronic pain syndrome 02/07/2015  . Bilateral nonexudative age-related macular degeneration 01/16/2015  . Presence of intraocular lens 01/16/2015  . Insomnia 07/26/2014  . Urge incontinence of urine 01/17/2014  . Urinary urgency 01/17/2014  . Malaise and fatigue 10/04/2013  . PAC (premature atrial contraction) 10/04/2013  . Depression 02/15/2013  . Myasthenia gravis ([place]) 03/17/2012  . Spinal stenosis, lumbar 01/14/2012  . Ptosis of eyelid 01/14/2012  . Pure hypercholesterolemia 09/05/2011  . Benign hypertensive heart disease without heart failure 09/05/2011  . Osteoarthritis 09/05/2011    Dorene Ar, PTA 08/12/2019, 12:26 PM  [facility] [address] [place], [place], [address] Phone: [phone]   Fax:  [phone]  Name: Tamara Gregory MRN: [id number] Date of Birth: [birth year]

## 2019-08-16 ENCOUNTER — Other Ambulatory Visit (INDEPENDENT_AMBULATORY_CARE_PROVIDER_SITE_OTHER): Payer: Medicare Other

## 2019-08-16 DIAGNOSIS — D649 Anemia, unspecified: Secondary | ICD-10-CM | POA: Diagnosis not present

## 2019-08-16 DIAGNOSIS — E039 Hypothyroidism, unspecified: Secondary | ICD-10-CM | POA: Diagnosis not present

## 2019-08-16 LAB — CBC WITH DIFFERENTIAL/PLATELET
Basophils Absolute: 0 10*3/uL (ref 0.0–0.1)
Basophils Relative: 0.4 % (ref 0.0–3.0)
Eosinophils Absolute: 0 10*3/uL (ref 0.0–0.7)
Eosinophils Relative: 0.4 % (ref 0.0–5.0)
HCT: 36.2 % (ref 36.0–46.0)
Hemoglobin: 11.8 g/dL — ABNORMAL LOW (ref 12.0–15.0)
Lymphocytes Relative: 10.9 % — ABNORMAL LOW (ref 12.0–46.0)
Lymphs Abs: 0.8 10*3/uL (ref 0.7–4.0)
MCHC: 32.7 g/dL (ref 30.0–36.0)
MCV: 92.2 fl (ref 78.0–100.0)
Monocytes Absolute: 0.5 10*3/uL (ref 0.1–1.0)
Monocytes Relative: 7.8 % (ref 3.0–12.0)
Neutro Abs: 5.6 10*3/uL (ref 1.4–7.7)
Neutrophils Relative %: 80.5 % — ABNORMAL HIGH (ref 43.0–77.0)
Platelets: 208 10*3/uL (ref 150.0–400.0)
RBC: 3.92 Mil/uL (ref 3.87–5.11)
RDW: 19.8 % — ABNORMAL HIGH (ref 11.5–15.5)
WBC: 7 10*3/uL (ref 4.0–10.5)

## 2019-08-16 LAB — TSH: TSH: 2.24 u[IU]/mL (ref 0.35–4.50)

## 2019-08-16 NOTE — Progress Notes (Signed)
CARDIOLOGY OFFICE NOTE  Date:  08/17/2019    Tamara Gregory Date of Birth: 05/27/1925 Medical Record [id number]  PCP:  Tamara Rail, MD  Cardiologist:  Former patient of Dr. Sherryl Gregory.   Now Tamara Gregory  Chief Complaint  Patient presents with   Aortic Stenosis   Congestive Heart Failure    Notes from Tamara Gregory.  Tamara Gregory is a 85 y.o. female who presents today for a 4 month check. Former patient of Dr. Sherryl Gregory.   She has a past history of HLD, HTN and myasthenia gravis and is followed for that at [facility]. She has had previous back surgery by Dr. Gladstone Gregory. She has a known 4 cm fusiform ascending aortic aneurysm . When the patient was initially admitted for her back surgery she had some chest pain in the preoperative holding area and her back surgery had to be postponed while she underwent a cardiac workup. She had a Myoview stress test which showed no ischemia. Her back surgery was subsequently rescheduled and performed without incident. She made a good recovery from her back surgery. However she has continued to have back pain and for this reason she underwent a implantation of a spinal cord stimulator as an outpatient at [facility] outpatient facility on 01/27/2014. The patient remains on low-dose prednisone 5 mg daily for her myasthenia gravis.   In March of 2015 she was hospitalized overnight at [facility] in [facility] for chest pain. She had a dobutamine stress echo the following morning which showed no evidence of ischemia and she was released. She was admitted with bradycardia and her metoprolol was stopped and has not been restarted.  Last visit back in November and she was felt to be doing well.   Comes back today. Here with her husband who is also being seen. She is mostly limited by her back pain - going to [place] later today. Taking at least 2 sometimes 3 Excedrins on a daily basis. Some belly  pain. Husband notes stool has been dark. She has early satiety. No chest pain. Breathing is good but she is more sedentary and will have some DOE with much activity. [place] the [place] - [place] there today. Has PCP arranged for June.   Sept. 21, 2017:  Tamara Gregory is seen today for the first time.   Previous patient of Tamara Gregory.  Has of HTN Doing well today .   Has a tens unit implanted in her hip   No CP or dyspnea   February 24, 2017:  Tamara Gregory is seen today with her husband [name] her 89th birthday recently .  Hx of hyperlipidemia , HTN,  No cardiac issues.  Had some chest pain /  Indigestion. Resolved when she dranks some soda   Dec. 19, 2018:  Has had some orthostasis .  Has fallen severl times.  BP has been low Not eating as much  Is on losartan - HCT   May 14, 2019   Tamara Gregory is seen in the office to discuss her recent symptoms of shortness of breath.  She had an echocardiogram recently which revealed severe aortic stenosis.  She was also found to have a large  apical aneurysm.  Her left ventricular systolic function is at least moderately reduced.  EF 35-40%.   She was also found to have mild - moderate aortic dilitation ( 4.4 cm )   She had a hip fracture 2  months ago . Lack of energy  No CP.  Has shortness of breath frequently .  waks with a walker since the hip fracture .  Prior to hip , she could walk but not far.   Has lots of back pain  BNP has been markedly elevated.   Does not avoid salt Was started on Furosemide which seems to be helping.   Is very weak.  Is still on HCTZ also  Sept. 1, 2020  Tamara Gregory is seen today for follow up of her CAD and CHF It is apparent that she has had a large ant. MI and has been found to have a large anterior apical aneurism on echo.  EF 25%. She has mod- severe AS.   She is very weak and deconditioned .   When I saw her in June,  My assessment was that she would not benefit from an evaluation for TAVR given  her severe limitations related to her markedly reduced EF .   Has myosthemmia gravis and now has diplopia. Has her left eyeglass blacked out  Is scheduled to see neuro .   Still trying to get over her hip fracture from 6 months ago . She had a large MI around the time of the hip fracture  Walks with a walker, very short distances.   Not able to get any exercise   Very limited in her activities.     Past Medical History:  Diagnosis Date   Anemia    Anxiety    Arthritis    Cancer ([place])    hx of skin cancer , colon    Cataract    removed both eyes    Chronic back pain    okay with sitting, increased back pain with activity    Depression    GERD (gastroesophageal reflux disease)    Glaucoma    Headache(784.0)    hx of   History of kidney stones    History of migraines    Hyperlipidemia    Hypertension    Macular degeneration    Myasthenia gravis ([place])    Occasional tremors    Pneumonia    Spinal stenosis    Thoracic aneurysm    4.4cm; see CT done 01/28/12 and 01/22/13 in EPIC.    Uses hearing aid    Vitamin D deficiency     Past Surgical History:  Procedure Laterality Date   ABDOMINAL HYSTERECTOMY     CHOLECYSTECTOMY     COLON SURGERY     Right colectomy dr. gross 03-17-18   HIP ARTHROPLASTY Left 03/11/2019   Procedure: ARTHROPLASTY BIPOLAR HIP (HEMIARTHROPLASTY);  Surgeon: Renette Butters, MD;  Location: [facility];  Service: Orthopedics;  Laterality: Left;   LUMBAR LAMINECTOMY/DECOMPRESSION MICRODISCECTOMY N/A 02/04/2013   Procedure: CENTRAL DECOMPRESSION/LUMBAR LAMINECTOMY L3-L4 AND L4-L5 2 LEVELS;  Surgeon: Tobi Bastos, MD;  Location: [facility];  Service: Orthopedics;  Laterality: N/A;   SPINAL CORD STIMULATOR IMPLANT     in right hip but not currently using because it did not help   TONSILLECTOMY     UPPER GASTROINTESTINAL ENDOSCOPY     US ECHOCARDIOGRAPHY  05/18/2007   EF 55-60%     Medications: Current Outpatient Medications    Medication Sig Dispense Refill   acetaminophen (TYLENOL) 325 MG tablet Take 2 tablets (650 mg total) by mouth every 6 (six) hours as needed for headache.     aspirin 81 MG chewable tablet Chew 0.5 tablets (40.5 mg total) by mouth daily. 30 tablet  0   brimonidine (ALPHAGAN) 0.2 % ophthalmic solution Place 1 drop into both eyes 3 (three) times daily.      Cholecalciferol (VITAMIN D) 125 MCG (5000 UT) CAPS Take 5,000 Units by mouth daily.      fluticasone (FLONASE) 50 MCG/ACT nasal spray Place 1 spray into both nostrils at bedtime.     furosemide (LASIX) 20 MG tablet Take 2 tablets (40 mg total) by mouth daily. 90 tablet 3   latanoprost (XALATAN) 0.005 % ophthalmic solution Place 1 drop into the left eye at bedtime.  1   levothyroxine (SYNTHROID) 25 MCG tablet Take 1 tablet (25 mcg total) by mouth daily before breakfast. 30 tablet 5   losartan (COZAAR) 25 MG tablet Take 1 tablet (25 mg total) by mouth daily. 90 tablet 3   Multiple Vitamins-Minerals (PRESERVISION AREDS) CAPS Take 1 capsule by mouth 2 (two) times daily.      Omega-3 Fatty Acids (FISH OIL) 435 MG CAPS Take 435 mg by mouth daily.     potassium chloride SA (K-DUR) 20 MEQ tablet Dissolve in a small amount of water (may take 2 min to dissolve) then add to a small amount of apple sauce or pudding and eat right away 30 tablet 3   rosuvastatin (CRESTOR) 5 MG tablet TAKE 1 TABLET BY MOUTH  EVERY DAY IN THE EVENING 90 tablet 1   sertraline (ZOLOFT) 100 MG tablet Take 200 mg by mouth at bedtime. Take 2 tabs at bedtime     No current facility-administered medications for this visit.     Allergies: Allergies  Allergen Reactions   Tramadol Other (See Comments)    delirium    Social History: The patient  reports that she has never smoked. She has never used smokeless tobacco. She reports that she does not drink alcohol or use drugs.   Family History: The patient's family history includes Breast cancer in her sister; Cancer  in her brother, sister, and sister; Diabetes in her father and sister; Heart attack in her father; Liver cancer in her sister; Lung cancer in her brother.   Review of Systems: Please see the history of present illness.   Otherwise, the review of systems is positive for none.   All other systems are reviewed and negative.   Physical Exam: Blood pressure 118/78, pulse 87, height 5' (1.524 m), weight 142 lb 12.8 oz (64.8 kg), SpO2 90 %.  GEN:  El;derly female,    HEENT: Normal NECK: No JVD; No carotid bruits LYMPHATICS: No lymphadenopathy CARDIAC: RRR,  2/6 systolic murmjur RESPIRATORY:  Clear to auscultation without rales, wheezing or rhonchi  ABDOMEN: Soft, non-tender, non-distended MUSCULOSKELETAL:  No edema; No deformity  SKIN: Warm and dry NEUROLOGIC:  Alert and oriented x 3   LABORATORY DATA:  EKG: November 19, 2017: Normal sinus rhythm at 81.  She has premature atrial contractions.  Lab Results  Component Value Date   WBC 7.0 08/16/2019   HGB 11.8 (L) 08/16/2019   HCT 36.2 08/16/2019   PLT 208.0 08/16/2019   GLUCOSE 124 (H) 07/20/2019   CHOL 157 11/19/2017   TRIG 142.0 11/19/2017   HDL 67.20 11/19/2017   LDLDIRECT 139.3 10/04/2013   LDLCALC 61 11/19/2017   ALT 17 07/20/2019   AST 22 07/20/2019   NA 141 07/20/2019   K 4.4 07/20/2019   CL 104 07/20/2019   CREATININE 0.77 07/20/2019   BUN 17 07/20/2019   CO2 26 07/20/2019   TSH 2.24 08/16/2019   INR 1.01 01/21/2013  HGBA1C 6.1 06/29/2019    BNP (last 3 results) No results for input(s): BNP in the last 8760 hours.  ProBNP (last 3 results) Recent Labs    04/30/19 1701 05/07/19 0955  PROBNP 4,751* 4,628*     Other Studies Reviewed Today:  Echo Study Conclusions from 2014  - Left ventricle: The cavity size was normal. Wall thickness was increased in a pattern of moderate LVH. There was mild focal basal hypertrophy of the septum. Systolic function was normal. The estimated ejection fraction was  in the range of 55% to 60%. Wall motion was normal; there were no regional wall motion abnormalities. Doppler parameters are consistent with abnormal left ventricular relaxation (grade 1 diastolic dysfunction). - Aortic valve: Valve mobility was restricted. There was mild stenosis. Mild regurgitation. Valve area: 1.6cm^2(VTI). Valve area: 1.48cm^2 (Vmax).  CT Angio CHEST IMPRESSION FROM 01/2013: Unchanged thoracic aneurysm with maximal measurement 44 mm. Severe atherosclerosis with ulcerated plaque along the arch but no acute abnormalities.    Assessment/Plan: 1. Acute on chronic systolic CHF:    Stable for now    2.  CAD :  Likely had a MI with her hip fracture   3.  AS _- mod - severe.   I do not think TAVR would benefit And also she would have troble getting through rehab.   2. Hypercholesterolemia -     3. Myasthenia gravis followed at [facility]  4. Past history of spinal stenosis with chronic low back pain. Implantation of spinal cord stimulator at [facility] on 01/27/14   5.  Frequent falls and poor balance  6. History of fusiform aneurysm of ascending aorta 4 cm -   Current medicines are reviewed with the patient today.  The patient does not have concerns regarding medicines other than what has been noted above.  The following changes have been made:  See above.  Labs/ tests ordered today include:    No orders of the defined types were placed in this encounter.    Mertie Moores, MD  08/17/2019 5:36 PM    [address],  [place], [place]  [address] Pager [phone] Phone: [phone]; Fax: [phone]

## 2019-08-17 ENCOUNTER — Encounter: Payer: Self-pay | Admitting: Cardiovascular Disease

## 2019-08-17 ENCOUNTER — Encounter: Payer: Self-pay | Admitting: Physical Therapy

## 2019-08-17 ENCOUNTER — Ambulatory Visit: Payer: Medicare Other | Admitting: Physical Therapy

## 2019-08-17 ENCOUNTER — Other Ambulatory Visit: Payer: Self-pay

## 2019-08-17 ENCOUNTER — Ambulatory Visit (INDEPENDENT_AMBULATORY_CARE_PROVIDER_SITE_OTHER): Payer: Medicare Other | Admitting: Cardiovascular Disease

## 2019-08-17 VITALS — BP 118/78 | HR 87 | Ht 60.0 in | Wt 142.8 lb

## 2019-08-17 DIAGNOSIS — I2510 Atherosclerotic heart disease of native coronary artery without angina pectoris: Secondary | ICD-10-CM

## 2019-08-17 DIAGNOSIS — R2689 Other abnormalities of gait and mobility: Secondary | ICD-10-CM | POA: Diagnosis not present

## 2019-08-17 DIAGNOSIS — R262 Difficulty in walking, not elsewhere classified: Secondary | ICD-10-CM

## 2019-08-17 DIAGNOSIS — S72002A Fracture of unspecified part of neck of left femur, initial encounter for closed fracture: Secondary | ICD-10-CM | POA: Diagnosis not present

## 2019-08-17 DIAGNOSIS — M6281 Muscle weakness (generalized): Secondary | ICD-10-CM | POA: Diagnosis not present

## 2019-08-17 DIAGNOSIS — I5023 Acute on chronic systolic (congestive) heart failure: Secondary | ICD-10-CM | POA: Diagnosis not present

## 2019-08-17 NOTE — Patient Instructions (Signed)
Medication Instructions:  Your physician recommends that you continue on your current medications as directed. Please refer to the Current Medication list given to you today.  If you need a refill on your cardiac medications before your next appointment, please call your pharmacy.   Lab work: None  If you have labs (blood work) drawn today and your tests are completely normal, you will receive your results only by: . MyChart Message (if you have MyChart) OR . A paper copy in the mail If you have any lab test that is abnormal or we need to change your treatment, we will call you to review the results.  Testing/Procedures: None  Follow-Up: At [facility], you and your health needs are our priority.  As part of our continuing mission to provide you with exceptional heart care, we have created designated Provider Care Teams.  These Care Teams include your primary Cardiologist (physician) and Advanced Practice Providers (APPs -  Physician Assistants and Nurse Practitioners) who all work together to provide you with the care you need, when you need it. You will need a follow up appointment in:  3 months.  Please call our office 2 months in advance to schedule this appointment.  You may see Philip Nahser, MD or one of the following Advanced Practice Providers on your designated Care Team: Scott Weaver, PA-C Vin Bhagat, PA-C . Janine Hammond, NP  Any Other Special Instructions Will Be Listed Below (If Applicable).    

## 2019-08-17 NOTE — Therapy (Signed)
[place], [place], [address] Phone: [phone]   Fax:  [phone]  Physical Therapy Treatment  Patient Details  Name: Tamara Gregory MRN: [id number] Date of Birth: 1925/10/06 Referring Provider (PT): Jamse Arn MD   Encounter Date: 08/17/2019  PT End of Session - 08/17/19 0937    Visit Number  7    Number of Visits  13    Date for PT Re-Evaluation  09/17/19    Authorization Type  UHC    PT Start Time  0932    PT Stop Time  1000   ended early for restroom and had to go to another appt   PT Time Calculation (min)  28 min    Activity Tolerance  Patient tolerated treatment well    Behavior During Therapy  [facility] for tasks assessed/performed       Past Medical History:  Diagnosis Date  . Anemia   . Anxiety   . Arthritis   . Cancer (HCC)    hx of skin cancer , colon   . Cataract    removed both eyes   . Chronic back pain    okay with sitting, increased back pain with activity   . Depression   . GERD (gastroesophageal reflux disease)   . Glaucoma   . Headache(784.0)    hx of  . History of kidney stones   . History of migraines   . Hyperlipidemia   . Hypertension   . Macular degeneration   . Myasthenia gravis ([place])   . Occasional tremors   . Pneumonia   . Spinal stenosis   . Thoracic aneurysm    4.4cm; see CT done 01/28/12 and 01/22/13 in EPIC.   [name] Uses hearing aid   . Vitamin D deficiency     Past Surgical History:  Procedure Laterality Date  . ABDOMINAL HYSTERECTOMY    . CHOLECYSTECTOMY    . COLON SURGERY     Right colectomy dr. gross 03-17-18  . HIP ARTHROPLASTY Left 03/11/2019   Procedure: ARTHROPLASTY BIPOLAR HIP (HEMIARTHROPLASTY);  Surgeon: Renette Butters, MD;  Location: [place];  Service: Orthopedics;  Laterality: Left;  . LUMBAR LAMINECTOMY/DECOMPRESSION MICRODISCECTOMY N/A 02/04/2013   Procedure: CENTRAL DECOMPRESSION/LUMBAR LAMINECTOMY L3-L4 AND L4-L5 2 LEVELS;  Surgeon:  Tobi Bastos, MD;  [facility];  Service: Orthopedics;  Laterality: N/A;  . SPINAL CORD STIMULATOR IMPLANT     in right hip but not currently using because it did not help  . TONSILLECTOMY    . UPPER GASTROINTESTINAL ENDOSCOPY    . US ECHOCARDIOGRAPHY  05/18/2007   EF 55-60%    There were no vitals filed for this visit.  Subjective Assessment - 08/17/19 0937    Subjective  I do my exercises off an on, I do my legs before I get up in the AM. Dont exercise as regularly as I would like to.    Patient Stated Goals  Walk better    Currently in Pain?  No/denies                       [facility] Adult PT Treatment/Exercise - 08/17/19 0001      Knee/Hip Exercises: Aerobic   Nustep  5 min L3 LE only      Knee/Hip Exercises: Standing   Gait Training  in // bars- cues for wide stance, heel toe, upright posture; fwd, retro & side stepping- 3 laps each  Other Standing Knee Exercises  fwd step & return in // bars                  PT Long Term Goals - 08/10/19 1038      PT LONG TERM GOAL #1   Title  Pt will be independent in her HEP and progression.    Time  8    Status  On-going      PT LONG TERM GOAL #2   Title  Pt will improve BERG balance score to > 30/56 for improved mobility (07/19/2019)    Time  8    Period  Weeks    Status  New      PT LONG TERM GOAL #3   Title  Pt will improve her L LE strength to >/= 4/5 in order to improve gait and functional mobility. (07/19/2019)    Time  8    Period  Weeks    Status  New      PT LONG TERM GOAL #4   Title  Pt will be able to ambulate > 300' with rollator walker modified independent for improved mobility (07/19/2019)    Time  8    Period  Weeks            Plan - 08/17/19 1009    Clinical Impression Statement  Pt requested to work on walking today so we utilized // bars to encourage increased step width and upright posture with visual cues in mirror. Multiple rest breaks. Tends to bend and reach for  an object so multiple cues given to take steps toward object rather than reach from farther away.    PT Treatment/Interventions  ADLs/Self Care Home Management;Moist Heat;Gait training;Functional mobility training;Therapeutic activities;Therapeutic exercise;Balance training;Patient/family education;Neuromuscular re-education;Manual techniques;Passive range of motion    PT Next Visit Plan  gait and balance, closed chain strength as tolerated    PT Home Exercise Plan  seated hip flexion, supine bridge and SLR, side hip abduction, upright posture in standing- stand every 30 min, widen stance    Consulted and Agree with Plan of Care  Patient       Patient will benefit from skilled therapeutic intervention in order to improve the following deficits and impairments:  Abnormal gait, Difficulty walking, Cardiopulmonary status limiting activity, Pain, Decreased activity tolerance, Decreased strength, Decreased mobility  Visit Diagnosis: Muscle weakness (generalized)  Difficulty in walking, not elsewhere classified  Other abnormalities of gait and mobility     Problem List Patient Active Problem List   Diagnosis Date Noted  . invasive SCC (squamous cell carcinoma), leg, left 07/31/2019  . Hypothyroidism 07/01/2019  . Closed fracture of left hip ([place]) 06/30/2019  . Hair loss 06/29/2019  . Acute on chronic systolic CHF (congestive heart failure) ([place]) 05/14/2019  . Sleep disturbance 04/29/2019  . Postoperative pain   . Tachycardia   . Steroid-induced hyperglycemia   . Labile blood pressure   . Leukocytosis   . E. coli UTI   . Hypoalbuminemia due to protein-calorie malnutrition ([place])   . Ocular myasthenia gravis ([place])   . Anemia   . Hip fracture ([place]) 03/10/2019  . Left displaced femoral neck fracture ([place]) 03/10/2019  . Weakness 12/04/2018  . Hypokalemia 03/18/2018  . 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 Right ovarian cyst s/p RSO 03/17/2018  . Diarrhea  12/23/2017  . Large hiatal hernia 04/01/2017  . Sweating profusely 03/19/2017  . Hyperglycemia 03/19/2017  . Squamous cell skin cancer  11/05/2016  . Nosebleed 10/19/2016  . Osteopenia 08/28/2016  . Essential hypertension, benign 08/21/2016  . Long term current use of systemic steroids 08/21/2016  . Primary open angle glaucoma of both eyes, mild stage 05/14/2016  . Ocular hypertension of left eye 05/08/2016  . GERD (gastroesophageal reflux disease) 05/06/2016  . Macular pucker, bilateral 11/14/2015  . Status post intraocular lens implant 04/04/2015  . Exudative age-related macular degeneration ([place]) 04/04/2015  . Chronic pain syndrome 02/07/2015  . Bilateral nonexudative age-related macular degeneration 01/16/2015  . Presence of intraocular lens 01/16/2015  . Insomnia 07/26/2014  . Urge incontinence of urine 01/17/2014  . Urinary urgency 01/17/2014  . Malaise and fatigue 10/04/2013  . PAC (premature atrial contraction) 10/04/2013  . Depression 02/15/2013  . Myasthenia gravis ([place]) 03/17/2012  . Spinal stenosis, lumbar 01/14/2012  . Ptosis of eyelid 01/14/2012  . Pure hypercholesterolemia 09/05/2011  . Benign hypertensive heart disease without heart failure 09/05/2011  . Osteoarthritis 09/05/2011    Aundrea Higginbotham C. Josselin Gaulin PT, DPT 08/17/19 10:12 AM   [facility] [address] [place], [place], [address] Phone: [phone]   Fax:  [phone]  Name: Tamara Gregory MRN: [id number] Date of Birth: 1925-08-24

## 2019-08-18 ENCOUNTER — Encounter: Payer: Self-pay | Admitting: Physical Medicine & Rehabilitation

## 2019-08-18 ENCOUNTER — Encounter: Payer: Medicare Other | Attending: Physical Medicine & Rehabilitation | Admitting: Physical Medicine & Rehabilitation

## 2019-08-18 VITALS — BP 105/64 | HR 81 | Temp 97.9°F

## 2019-08-18 DIAGNOSIS — Z7409 Other reduced mobility: Secondary | ICD-10-CM | POA: Insufficient documentation

## 2019-08-18 DIAGNOSIS — G479 Sleep disorder, unspecified: Secondary | ICD-10-CM

## 2019-08-18 DIAGNOSIS — R269 Unspecified abnormalities of gait and mobility: Secondary | ICD-10-CM | POA: Insufficient documentation

## 2019-08-18 DIAGNOSIS — G7000 Myasthenia gravis without (acute) exacerbation: Secondary | ICD-10-CM

## 2019-08-18 NOTE — Progress Notes (Signed)
Subjective:    Patient ID: Tamara Gregory, female    DOB: 1925/11/13, 85 y.o.   MRN: [id number]   HPI Right-handed female with history of colon cancer with resection, chronic back pain followed by Dr. Maryjean Ka with spinal cord stimulator, hypertension, hyperlipidemia, ocular myasthenia gravis presents for follow-up after for left hip fracture status post hemiarthroplasty on 03/11/2019.  Husband supplements history. Last clinic visit 06/30/2019.  Since that time, she has developed diplopia and is following up at [facility] later this week. She is in therapies. Back pain is controlled.  Appetite has improved. Sleep is variable. Denies falls. She is using wheelchair most of the time, but using a walker more.   Pain Inventory Average Pain 0 Pain Right Now 0 My pain is na  In the last 24 hours, has pain interfered with the following? General activity 1 Relation with others 1 Enjoyment of life 1 What TIME of day is your pain at its worst? na Sleep (in general) Fair  Pain is worse with: na Pain improves with: na Relief from Meds: na  Mobility use a wheelchair needs help with transfers  Function retired I need assistance with the following:  feeding, dressing, bathing, toileting, meal prep, household duties and shopping  Neuro/Psych bladder control problems bowel control problems weakness numbness tingling trouble walking  Prior Studies Any changes since last visit?  yes bone scan CT/MRI  Physicians involved in your care Primary care Dr. Celso Amy   Family History  Problem Relation Age of Onset  . Heart attack Father   . Diabetes Father   . Cancer Sister   . Breast cancer Sister   . Cancer Brother   . Lung cancer Brother   . Cancer Sister   . Liver cancer Sister   . Diabetes Sister   . Colon polyps Neg Hx   . Colon cancer Neg Hx   . Esophageal cancer Neg Hx   . Rectal cancer Neg Hx   . Stomach cancer Neg Hx   . Adrenal disorder Neg Hx    Social History    Socioeconomic History  . Marital status: Married    Spouse name: Not on file  . Number of children: 3  . Years of education: Not on file  . Highest education level: Not on file  Occupational History  . Occupation: retired  Scientific laboratory technician  . Financial resource strain: Not hard at all  . Food insecurity    Worry: Never true    Inability: Never true  . Transportation needs    Medical: No    Non-medical: No  Tobacco Use  . Smoking status: Never Smoker  . Smokeless tobacco: Never Used  Substance and Sexual Activity  . Alcohol use: No  . Drug use: No  . Sexual activity: Not Currently  Lifestyle  . Physical activity    Days per week: 0 days    Minutes per session: 0 min  . Stress: Rather much  Relationships  . Social connections    Talks on phone: More than three times a week    Gets together: Once a week    Attends religious service: 1 to 4 times per year    Active member of club or organization: Yes    Attends meetings of clubs or organizations: More than 4 times per year    Relationship status: Married  Other Topics Concern  . Not on file  Social History Narrative   Lives with husband in a one story home.  Has 2 sons.  Retired.  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  Education: college.  [employer].   Past Surgical History:  Procedure Laterality Date  . ABDOMINAL HYSTERECTOMY    . CHOLECYSTECTOMY    . COLON SURGERY     Right colectomy dr. gross 03-17-18  . HIP ARTHROPLASTY Left 03/11/2019   Procedure: ARTHROPLASTY BIPOLAR HIP (HEMIARTHROPLASTY);  Surgeon: Renette Butters, MD;  Location: [place];  Service: Orthopedics;  Laterality: Left;  . LUMBAR LAMINECTOMY/DECOMPRESSION MICRODISCECTOMY N/A 02/04/2013   Procedure: CENTRAL DECOMPRESSION/LUMBAR LAMINECTOMY L3-L4 AND L4-L5 2 LEVELS;  Surgeon: Tobi Bastos, MD;  [facility];  Service: Orthopedics;  Laterality: N/A;  . SPINAL CORD STIMULATOR IMPLANT     in right hip but not currently using  because it did not help  . TONSILLECTOMY    . UPPER GASTROINTESTINAL ENDOSCOPY    . US ECHOCARDIOGRAPHY  05/18/2007   EF 55-60%   Past Medical History:  Diagnosis Date  . Anemia   . Anxiety   . Arthritis   . Cancer (HCC)    hx of skin cancer , colon   . Cataract    removed both eyes   . Chronic back pain    okay with sitting, increased back pain with activity   . Depression   . GERD (gastroesophageal reflux disease)   . Glaucoma   . Headache(784.0)    hx of  . History of kidney stones   . History of migraines   . Hyperlipidemia   . Hypertension   . Macular degeneration   . Myasthenia gravis ([place])   . Occasional tremors   . Pneumonia   . Spinal stenosis   . Thoracic aneurysm    4.4cm; see CT done 01/28/12 and 01/22/13 in EPIC.   [name] Uses hearing aid   . Vitamin D deficiency    BP 105/64   Pulse 81   Temp 97.9 F (36.6 C)   SpO2 91%   Opioid Risk Score:   Fall Risk Score:  `1  Depression screen PHQ 2/9  Depression screen [facility] 2/9 01/27/2019 11/19/2017 03/19/2017 05/06/2016  Decreased Interest 3 3 3  0  Down, Depressed, Hopeless 3 2 2  0  PHQ - 2 Score 6 5 5  0  Altered sleeping 3 2 1  -  Tired, decreased energy 3 3 3  -  Change in appetite 1 1 1  -  Feeling bad or failure about yourself  2 1 0 -  Trouble concentrating 1 0 0 -  Moving slowly or fidgety/restless 0 0 0 -  Suicidal thoughts 0 0 0 -  PHQ-9 Score 16 12 10  -  Difficult doing work/chores Somewhat difficult Somewhat difficult - -  Some recent data might be hidden    Review of Systems  Constitutional: Negative.   HENT: Negative.   Eyes: Negative.   Respiratory: Positive for shortness of breath.   Cardiovascular: Negative.   Gastrointestinal: Negative.   Endocrine: Negative.   Genitourinary: Positive for difficulty urinating.  Musculoskeletal: Positive for gait problem.  Skin: Negative.   Allergic/Immunologic: Negative.   Neurological: Positive for tremors, weakness and numbness.  Hematological:  Negative.   Psychiatric/Behavioral: Positive for dysphoric mood. The patient is nervous/anxious.       Objective:   Physical Exam Constitutional: No distress . Vital signs reviewed. HENT: Normocephalic. Atraumatic.  Eyes: Left eye patched. No discharge. Cardiovascular: No JVD. Respiratory: Normal effort. GI: Non-distended. Musc: No edema or tenderness in extremities. Neurological: Alert [facility]  Makes good eye contact with examiner.  Motor:  RLE 4+/5 prox to distal  LLE 4+/5 HF, KE, ADF/PF  Skin: Vascular changes distal left lower extremity. Psychiatric: Normal mood.  Normal affect.    Assessment & Plan:  Right-handed female with history of colon cancer with resection, chronic back pain followed by Dr. Maryjean Ka with spinal cord stimulator, hypertension, hyperlipidemia, ocular myasthenia gravis presents for follow up for left hip fracture status post hemiarthroplasty on 03/11/2019.  1. Decreased functional mobility secondary to left displaced femoral neck fracture .Status post bipolar hip hemiarthroplasty on 03/11/2019.  Cont therapies  Endurance most limiting factor at present, multifactorial - MG, steroids, near diagnosis CHF, poor appetite, sleep disturbance, and age related changes, improving  2. Pain Management/chronic back pain:   Improved   Cont tylenol PRN  3. Poor appetite - improving  4. History of ocular myasthenia gravis.   Cont chronic prednisone   Follow up with Neurology at [facility]  5. Poor sleep  Variable, but overall improved  Trial Melatonin   6. Gait abnormality  Cont therapies  Cont walker/wheelchair for safety

## 2019-08-19 ENCOUNTER — Other Ambulatory Visit: Payer: Self-pay

## 2019-08-19 ENCOUNTER — Encounter: Payer: Self-pay | Admitting: Physical Therapy

## 2019-08-19 ENCOUNTER — Ambulatory Visit: Payer: Medicare Other | Admitting: Physical Therapy

## 2019-08-19 DIAGNOSIS — M6281 Muscle weakness (generalized): Secondary | ICD-10-CM

## 2019-08-19 DIAGNOSIS — R2689 Other abnormalities of gait and mobility: Secondary | ICD-10-CM

## 2019-08-19 DIAGNOSIS — R262 Difficulty in walking, not elsewhere classified: Secondary | ICD-10-CM | POA: Diagnosis not present

## 2019-08-19 DIAGNOSIS — S72002A Fracture of unspecified part of neck of left femur, initial encounter for closed fracture: Secondary | ICD-10-CM

## 2019-08-19 NOTE — Therapy (Signed)
[place], [place], [address] Phone: [phone]   Fax:  [phone]  Physical Therapy Treatment  Patient Details  Name: Tamara Gregory MRN: [id number] Date of Birth: 1925-08-27 Referring Provider (PT): Jamse Arn MD   Encounter Date: 08/19/2019  PT End of Session - 08/19/19 1027    Visit Number  8    Number of Visits  13    Date for PT Re-Evaluation  09/17/19    Authorization Type  UHC    PT Start Time  1021    PT Stop Time  1100    PT Time Calculation (min)  39 min       Past Medical History:  Diagnosis Date  . Anemia   . Anxiety   . Arthritis   . Cancer (HCC)    hx of skin cancer , colon   . Cataract    removed both eyes   . Chronic back pain    okay with sitting, increased back pain with activity   . Depression   . GERD (gastroesophageal reflux disease)   . Glaucoma   . Headache(784.0)    hx of  . History of kidney stones   . History of migraines   . Hyperlipidemia   . Hypertension   . Macular degeneration   . Myasthenia gravis ([place])   . Occasional tremors   . Pneumonia   . Spinal stenosis   . Thoracic aneurysm    4.4cm; see CT done 01/28/12 and 01/22/13 in EPIC.   [name] Uses hearing aid   . Vitamin D deficiency     Past Surgical History:  Procedure Laterality Date  . ABDOMINAL HYSTERECTOMY    . CHOLECYSTECTOMY    . COLON SURGERY     Right colectomy dr. gross 03-17-18  . HIP ARTHROPLASTY Left 03/11/2019   Procedure: ARTHROPLASTY BIPOLAR HIP (HEMIARTHROPLASTY);  Surgeon: Renette Butters, MD;  Location: [place];  Service: Orthopedics;  Laterality: Left;  . LUMBAR LAMINECTOMY/DECOMPRESSION MICRODISCECTOMY N/A 02/04/2013   Procedure: CENTRAL DECOMPRESSION/LUMBAR LAMINECTOMY L3-L4 AND L4-L5 2 LEVELS;  Surgeon: Tobi Bastos, MD;  [facility];  Service: Orthopedics;  Laterality: N/A;  . SPINAL CORD STIMULATOR IMPLANT     in right hip but not currently using because it did not  help  . TONSILLECTOMY    . UPPER GASTROINTESTINAL ENDOSCOPY    . US ECHOCARDIOGRAPHY  05/18/2007   EF 55-60%    There were no vitals filed for this visit.  Subjective Assessment - 08/19/19 1027    Subjective  I did not do my exercises this morning. I think I am as good as I am going to get.    Currently in Pain?  No/denies         [facility] PT Assessment - 08/19/19 0001      Observation/Other Assessments   Focus on Therapeutic Outcomes (FOTO)   NP                   [facility] Adult PT Treatment/Exercise - 08/19/19 0001      Ambulation/Gait   Ambulation/Gait  Yes    Ambulation Distance (Feet)  75 Feet    Assistive device  Rolling walker    Gait Comments  cues for erect trunk and increased step length       Neuro Re-ed    Neuro Re-ed Details   rhomberg 30 sec x 2, rhomberg with head turns, standard stance with trunk rotations all with  CGA and no UE , then alternating step taps with 1 UE       Knee/Hip Exercises: Standing   Heel Raises  20 reps    Hip Flexion  Stengthening;Both;10 reps    Hip Flexion Limitations  UE support    Hip Abduction  Stengthening;Both;10 reps    Abduction Limitations  UE support      Knee/Hip Exercises: Supine   Bridges  2 sets;10 reps    Straight Leg Raises  2 sets;10 reps      Knee/Hip Exercises: Sidelying   Hip ABduction  Both;2 sets;10 reps    Hip ABduction Limitations  2 sets                   PT Long Term Goals - 08/10/19 1038      PT LONG TERM GOAL #1   Title  Pt will be independent in her HEP and progression.    Time  8    Status  On-going      PT LONG TERM GOAL #2   Title  Pt will improve BERG balance score to > 30/56 for improved mobility (07/19/2019)    Time  8    Period  Weeks    Status  New      PT LONG TERM GOAL #3   Title  Pt will improve her L LE strength to >/= 4/5 in order to improve gait and functional mobility. (07/19/2019)    Time  8    Period  Weeks    Status  New      PT LONG TERM GOAL #4    Title  Pt will be able to ambulate > 300' with rollator walker modified independent for improved mobility (07/19/2019)    Time  8    Period  Weeks            Plan - 08/19/19 1052    Clinical Impression Statement  Continued with gati, balance and LE strength. Multiple rest breaks due to SOB and mod cues for erect posture during standing activities.    Comorbidities  myasthenia gravis ocular, HTN, CHF, GERD, spinal stenosis, OA, hip fxx, leukocytosis, anemia, tachycardia, urinary incontience       Patient will benefit from skilled therapeutic intervention in order to improve the following deficits and impairments:  Abnormal gait, Difficulty walking, Cardiopulmonary status limiting activity, Pain, Decreased activity tolerance, Decreased strength, Decreased mobility  Visit Diagnosis: Muscle weakness (generalized)  Difficulty in walking, not elsewhere classified  Other abnormalities of gait and mobility  Left displaced femoral neck fracture [facility])     Problem List Patient Active Problem List   Diagnosis Date Noted  . CAD (coronary artery disease) 08/17/2019  . invasive SCC (squamous cell carcinoma), leg, left 07/31/2019  . Hypothyroidism 07/01/2019  . Closed fracture of left hip ([place]) 06/30/2019  . Hair loss 06/29/2019  . Acute on chronic systolic CHF (congestive heart failure) ([place]) 05/14/2019  . Sleep disturbance 04/29/2019  . Postoperative pain   . Tachycardia   . Steroid-induced hyperglycemia   . Labile blood pressure   . Leukocytosis   . E. coli UTI   . Hypoalbuminemia due to protein-calorie malnutrition ([place])   . Ocular myasthenia gravis ([place])   . Anemia   . Hip fracture ([place]) 03/10/2019  . Left displaced femoral neck fracture ([place]) 03/10/2019  . Weakness 12/04/2018  . Hypokalemia 03/18/2018  . 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 Right ovarian cyst s/p RSO  03/17/2018  . Diarrhea 12/23/2017  . Large hiatal hernia 04/01/2017  .  Sweating profusely 03/19/2017  . Hyperglycemia 03/19/2017  . Squamous cell skin cancer 11/05/2016  . Nosebleed 10/19/2016  . Osteopenia 08/28/2016  . Essential hypertension, benign 08/21/2016  . Long term current use of systemic steroids 08/21/2016  . Primary open angle glaucoma of both eyes, mild stage 05/14/2016  . Ocular hypertension of left eye 05/08/2016  . GERD (gastroesophageal reflux disease) 05/06/2016  . Macular pucker, bilateral 11/14/2015  . Status post intraocular lens implant 04/04/2015  . Exudative age-related macular degeneration ([place]) 04/04/2015  . Chronic pain syndrome 02/07/2015  . Bilateral nonexudative age-related macular degeneration 01/16/2015  . Presence of intraocular lens 01/16/2015  . Insomnia 07/26/2014  . Urge incontinence of urine 01/17/2014  . Urinary urgency 01/17/2014  . Malaise and fatigue 10/04/2013  . PAC (premature atrial contraction) 10/04/2013  . Depression 02/15/2013  . Myasthenia gravis ([place]) 03/17/2012  . Spinal stenosis, lumbar 01/14/2012  . Ptosis of eyelid 01/14/2012  . Pure hypercholesterolemia 09/05/2011  . Benign hypertensive heart disease without heart failure 09/05/2011  . Osteoarthritis 09/05/2011    Dorene Ar, PTA 08/19/2019, 11:06 AM  [facility] [address] [place], [place], [address] Phone: [phone]   Fax:  [phone]  Name: Tamara Gregory MRN: [id number] Date of Birth: [birth year]

## 2019-08-20 ENCOUNTER — Telehealth: Payer: Self-pay | Admitting: Neurology

## 2019-08-20 DIAGNOSIS — H02403 Unspecified ptosis of bilateral eyelids: Secondary | ICD-10-CM | POA: Diagnosis not present

## 2019-08-20 DIAGNOSIS — H5021 Vertical strabismus, right eye: Secondary | ICD-10-CM | POA: Diagnosis not present

## 2019-08-20 DIAGNOSIS — Z9841 Cataract extraction status, right eye: Secondary | ICD-10-CM | POA: Diagnosis not present

## 2019-08-20 DIAGNOSIS — H353190 Nonexudative age-related macular degeneration, unspecified eye, stage unspecified: Secondary | ICD-10-CM | POA: Diagnosis not present

## 2019-08-20 DIAGNOSIS — H401130 Primary open-angle glaucoma, bilateral, stage unspecified: Secondary | ICD-10-CM | POA: Diagnosis not present

## 2019-08-20 DIAGNOSIS — Z9842 Cataract extraction status, left eye: Secondary | ICD-10-CM | POA: Diagnosis not present

## 2019-08-20 DIAGNOSIS — G7000 Myasthenia gravis without (acute) exacerbation: Secondary | ICD-10-CM | POA: Diagnosis not present

## 2019-08-20 DIAGNOSIS — Z961 Presence of intraocular lens: Secondary | ICD-10-CM | POA: Diagnosis not present

## 2019-08-20 NOTE — Telephone Encounter (Signed)
Patient's spouse called in regarding her having double vision and her having to wear a patch over her eye. He said she is a [facility] now. He said he would like to get her in to see Dr. Posey Pronto as soon as possible. Please Advise. Thank you

## 2019-08-20 NOTE — Telephone Encounter (Signed)
Please offer office visit on 9/25 at 9:30a or VV (video visit only) next Wednesday.

## 2019-08-20 NOTE — Telephone Encounter (Signed)
Please advise on appt slot

## 2019-08-24 ENCOUNTER — Ambulatory Visit: Payer: Medicare Other | Admitting: Physical Therapy

## 2019-08-24 ENCOUNTER — Other Ambulatory Visit: Payer: Self-pay

## 2019-08-24 ENCOUNTER — Encounter: Payer: Self-pay | Admitting: Physical Therapy

## 2019-08-24 DIAGNOSIS — M6281 Muscle weakness (generalized): Secondary | ICD-10-CM

## 2019-08-24 DIAGNOSIS — S72002A Fracture of unspecified part of neck of left femur, initial encounter for closed fracture: Secondary | ICD-10-CM | POA: Diagnosis not present

## 2019-08-24 DIAGNOSIS — R262 Difficulty in walking, not elsewhere classified: Secondary | ICD-10-CM | POA: Diagnosis not present

## 2019-08-24 DIAGNOSIS — R2689 Other abnormalities of gait and mobility: Secondary | ICD-10-CM | POA: Diagnosis not present

## 2019-08-24 NOTE — Therapy (Signed)
[place], [place], [address] Phone: [phone]   Fax:  [phone]  Physical Therapy Treatment  Patient Details  Name: Tamara Gregory MRN: [id number] Date of Birth: 1925-03-26 Referring Provider (PT): Jamse Arn MD   Encounter Date: 08/24/2019  PT End of Session - 08/24/19 1024    Visit Number  9    Number of Visits  13    Date for PT Re-Evaluation  09/17/19    Authorization Type  [employer]    PT Start Time  1016    PT Stop Time  1100    PT Time Calculation (min)  44 min       Past Medical History:  Diagnosis Date  . Anemia   . Anxiety   . Arthritis   . Cancer (HCC)    hx of skin cancer , colon   . Cataract    removed both eyes   . Chronic back pain    okay with sitting, increased back pain with activity   . Depression   . GERD (gastroesophageal reflux disease)   . Glaucoma   . Headache(784.0)    hx of  . History of kidney stones   . History of migraines   . Hyperlipidemia   . Hypertension   . Macular degeneration   . Myasthenia gravis ([place])   . Occasional tremors   . Pneumonia   . Spinal stenosis   . Thoracic aneurysm    4.4cm; see CT done 01/28/12 and 01/22/13 in [facility].   [name] Uses hearing aid   . Vitamin D deficiency     Past Surgical History:  Procedure Laterality Date  . ABDOMINAL HYSTERECTOMY    . CHOLECYSTECTOMY    . COLON SURGERY     Right colectomy dr. gross 03-17-18  . HIP ARTHROPLASTY Left 03/11/2019   Procedure: ARTHROPLASTY BIPOLAR HIP (HEMIARTHROPLASTY);  Surgeon: Renette Butters, MD;  Location: [facility];  Service: Orthopedics;  Laterality: Left;  . LUMBAR LAMINECTOMY/DECOMPRESSION MICRODISCECTOMY N/A 02/04/2013   Procedure: CENTRAL DECOMPRESSION/LUMBAR LAMINECTOMY L3-L4 AND L4-L5 2 LEVELS;  Surgeon: Tobi Bastos, MD;  Location: [facility];  Service: Orthopedics;  Laterality: N/A;  . SPINAL CORD STIMULATOR IMPLANT     in right hip but not currently using because it did not  help  . TONSILLECTOMY    . UPPER GASTROINTESTINAL ENDOSCOPY    . US ECHOCARDIOGRAPHY  05/18/2007   EF 55-60%    There were no vitals filed for this visit.  Subjective Assessment - 08/24/19 1023    Subjective  I do my leg lifts in the bed in the mornings and pull my knees to my chest.    Currently in Pain?  No/denies                       [facility] Adult PT Treatment/Exercise - 08/24/19 0001      Ambulation/Gait   Gait Comments  Forward , retro, side and tandem gain in parallel bars with 1 UE support , cues for erect posture       Neuro Re-ed    Neuro Re-ed Details   alternating step taps needs 1 UE or light touch, rhomberg 1 minute eyes open without UE , tandem needs l1 UE light touch to maintain, SLS with 1 UE support-cues to lighten touch      Knee/Hip Exercises: Aerobic   Nustep  5 min L3 LE only      Knee/Hip Exercises:  Standing   Forward Step Up Limitations  altenating step ups 5 times each in // bars with UE       Knee/Hip Exercises: Seated   Long Arc Quad  3 sets;10 reps    Long Arc Quad Weight  5 lbs.    Other Seated Knee/Hip Exercises  squat at chair in // bars, cues to lightly touch bars x10 , then x 10 with 1 UE only                   PT Long Term Goals - 08/10/19 1038      PT LONG TERM GOAL #1   Title  Pt will be independent in her HEP and progression.    Time  8    Status  On-going      PT LONG TERM GOAL #2   Title  Pt will improve BERG balance score to > 30/56 for improved mobility (07/19/2019)    Time  8    Period  Weeks    Status  New      PT LONG TERM GOAL #3   Title  Pt will improve her L LE strength to >/= 4/5 in order to improve gait and functional mobility. (07/19/2019)    Time  8    Period  Weeks    Status  New      PT LONG TERM GOAL #4   Title  Pt will be able to ambulate > 300' with rollator walker modified independent for improved mobility (07/19/2019)    Time  8    Period  Weeks            Plan - 08/24/19  1047    Clinical Impression Statement  Time spent in parallel bars focusing on closed chain strength and balance with rest breaks frequently due to SOB. Improved narrow stance and look over shoulder compared with initial BERG.    PT Next Visit Plan  recheck BERG and goals.    PT Home Exercise Plan  seated hip flexion, supine bridge and SLR, side hip abduction, upright posture in standing- stand every 30 min, widen stance       Patient will benefit from skilled therapeutic intervention in order to improve the following deficits and impairments:  Abnormal gait, Difficulty walking, Cardiopulmonary status limiting activity, Pain, Decreased activity tolerance, Decreased strength, Decreased mobility  Visit Diagnosis: Muscle weakness (generalized)  Difficulty in walking, not elsewhere classified  Other abnormalities of gait and mobility  Left displaced femoral neck fracture [facility])     Problem List Patient Active Problem List   Diagnosis Date Noted  . CAD (coronary artery disease) 08/17/2019  . invasive SCC (squamous cell carcinoma), leg, left 07/31/2019  . Hypothyroidism 07/01/2019  . Closed fracture of left hip ([place]) 06/30/2019  . Hair loss 06/29/2019  . Acute on chronic systolic CHF (congestive heart failure) ([place]) 05/14/2019  . Sleep disturbance 04/29/2019  . Postoperative pain   . Tachycardia   . Steroid-induced hyperglycemia   . Labile blood pressure   . Leukocytosis   . E. coli UTI   . Hypoalbuminemia due to protein-calorie malnutrition ([place])   . Ocular myasthenia gravis ([place])   . Anemia   . Hip fracture ([place]) 03/10/2019  . Left displaced femoral neck fracture ([place]) 03/10/2019  . Weakness 12/04/2018  . Hypokalemia 03/18/2018  . 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 Right ovarian cyst s/p RSO 03/17/2018  . Diarrhea 12/23/2017  . Large hiatal  hernia 04/01/2017  . Sweating profusely 03/19/2017  . Hyperglycemia 03/19/2017  . Squamous cell skin  cancer 11/05/2016  . Nosebleed 10/19/2016  . Osteopenia 08/28/2016  . Essential hypertension, benign 08/21/2016  . Long term current use of systemic steroids 08/21/2016  . Primary open angle glaucoma of both eyes, mild stage 05/14/2016  . Ocular hypertension of left eye 05/08/2016  . GERD (gastroesophageal reflux disease) 05/06/2016  . Macular pucker, bilateral 11/14/2015  . Status post intraocular lens implant 04/04/2015  . Exudative age-related macular degeneration ([place]) 04/04/2015  . Chronic pain syndrome 02/07/2015  . Bilateral nonexudative age-related macular degeneration 01/16/2015  . Presence of intraocular lens 01/16/2015  . Insomnia 07/26/2014  . Urge incontinence of urine 01/17/2014  . Urinary urgency 01/17/2014  . Malaise and fatigue 10/04/2013  . PAC (premature atrial contraction) 10/04/2013  . Depression 02/15/2013  . Myasthenia gravis ([place]) 03/17/2012  . Spinal stenosis, lumbar 01/14/2012  . Ptosis of eyelid 01/14/2012  . Pure hypercholesterolemia 09/05/2011  . Benign hypertensive heart disease without heart failure 09/05/2011  . Osteoarthritis 09/05/2011    Dorene Ar, PTA 08/24/2019, 11:36 AM  [facility] [address] [place], [place], [address] Phone: [phone]   Fax:  [phone]  Name: Tamara Gregory MRN: [id number] Date of Birth: 05/08/1925

## 2019-08-25 ENCOUNTER — Encounter: Payer: Self-pay | Admitting: Neurology

## 2019-08-26 ENCOUNTER — Other Ambulatory Visit: Payer: Self-pay

## 2019-08-26 ENCOUNTER — Ambulatory Visit: Payer: Medicare Other | Admitting: Physical Therapy

## 2019-08-26 ENCOUNTER — Encounter: Payer: Self-pay | Admitting: Physical Therapy

## 2019-08-26 DIAGNOSIS — R262 Difficulty in walking, not elsewhere classified: Secondary | ICD-10-CM | POA: Diagnosis not present

## 2019-08-26 DIAGNOSIS — M6281 Muscle weakness (generalized): Secondary | ICD-10-CM | POA: Diagnosis not present

## 2019-08-26 DIAGNOSIS — R2689 Other abnormalities of gait and mobility: Secondary | ICD-10-CM | POA: Diagnosis not present

## 2019-08-26 DIAGNOSIS — S72002A Fracture of unspecified part of neck of left femur, initial encounter for closed fracture: Secondary | ICD-10-CM | POA: Diagnosis not present

## 2019-08-26 NOTE — Therapy (Signed)
[place], [place], [address] Phone: [phone]   Fax:  [phone]  Physical Therapy Treatment  Patient Details  Name: Tamara Gregory MRN: [id number] Date of Birth: [birth year] Referring Provider (PT): Jamse Arn MD   Encounter Date: 08/26/2019    Past Medical History:  Diagnosis Date  . Anemia   . Anxiety   . Arthritis   . Cancer (HCC)    hx of skin cancer , colon   . Cataract    removed both eyes   . Chronic back pain    okay with sitting, increased back pain with activity   . Depression   . GERD (gastroesophageal reflux disease)   . Glaucoma   . Headache(784.0)    hx of  . History of kidney stones   . History of migraines   . Hyperlipidemia   . Hypertension   . Macular degeneration   . Myasthenia gravis ([place])   . Occasional tremors   . Pneumonia   . Spinal stenosis   . Thoracic aneurysm    4.4cm; see CT done 01/28/12 and 01/22/13 in [facility].   [name] Uses hearing aid   . Vitamin D deficiency     Past Surgical History:  Procedure Laterality Date  . ABDOMINAL HYSTERECTOMY    . CHOLECYSTECTOMY    . COLON SURGERY     Right colectomy dr. gross 03-17-18  . HIP ARTHROPLASTY Left 03/11/2019   Procedure: ARTHROPLASTY BIPOLAR HIP (HEMIARTHROPLASTY);  Surgeon: Renette Butters, MD;  Location: [facility];  Service: Orthopedics;  Laterality: Left;  . LUMBAR LAMINECTOMY/DECOMPRESSION MICRODISCECTOMY N/A 02/04/2013   Procedure: CENTRAL DECOMPRESSION/LUMBAR LAMINECTOMY L3-L4 AND L4-L5 2 LEVELS;  Surgeon: Tobi Bastos, MD;  Location: [facility];  Service: Orthopedics;  Laterality: N/A;  . SPINAL CORD STIMULATOR IMPLANT     in right hip but not currently using because it did not help  . TONSILLECTOMY    . UPPER GASTROINTESTINAL ENDOSCOPY    . US ECHOCARDIOGRAPHY  05/18/2007   EF 55-60%    There were no vitals filed for this visit.  Subjective Assessment - 08/26/19 1021    Subjective  Feeling low, it  just happens sometimes.    Currently in Pain?  No/denies         [facility] PT Assessment - 08/26/19 0001      Assessment   Medical Diagnosis  L hip fx, difficulty walking    Referring Provider (PT)  Jamse Arn MD      Strength   Overall Strength Comments  bil knee strength 5/5    Right Hip Flexion  4+/5    Right Hip Extension  4-/5    Right Hip ABduction  4/5    Left Hip Flexion  4+/5    Left Hip Extension  4-/5    Left Hip ABduction  4-/5                   [facility] Adult PT Treatment/Exercise - 08/26/19 0001      Knee/Hip Exercises: Aerobic   Nustep  5 min L3 LE only      Knee/Hip Exercises: Standing   Gait Training  with RW- cues for step length, width & posture      Knee/Hip Exercises: Seated   Long Arc Quad Limitations  alternating paired with OH press 1lb each; alt LAQ with ball bw knees    Ball Squeeze  paired with alternating UE punch with 1lb  Other Seated Knee/Hip Exercises  UE ER 1lb paired with LE clams    Marching Limitations  alt marching with biceps curl    Marching Weights  1 lbs.                  PT Long Term Goals - 08/26/19 1049      PT LONG TERM GOAL #1   Title  Pt will be independent in her HEP and progression.    Baseline  reports doing her exercises in bed in the morning    Status  On-going      PT LONG TERM GOAL #2   Title  Pt will improve BERG balance score to > 30/56 for improved mobility (07/19/2019)    Baseline  to be tested next visit    Status  Unable to assess      PT LONG TERM GOAL #3   Title  Pt will improve her L LE strength to >/= 4/5 in order to improve gait and functional mobility. (07/19/2019)    Baseline  see flowsheet    Status  On-going      PT LONG TERM GOAL #4   Title  Pt will be able to ambulate > 300' with rollator walker modified independent for improved mobility (07/19/2019)    Baseline  CGA with multiple cues for posture & keeping RW close    Status  On-going            Plan -  08/26/19 1054    Clinical Impression Statement  Asked her to think about 2 things when walking- upright posture & wide feet. Will test BERG at beginning of next visit as she was tired after today. We discussed importance of some light upper body work and its effects on posture/balance.    PT Treatment/Interventions  ADLs/Self Care Home Management;Moist Heat;Gait training;Functional mobility training;Therapeutic activities;Therapeutic exercise;Balance training;Patient/family education;Neuromuscular re-education;Manual techniques;Passive range of motion    PT Next Visit Plan  test & update BERG goal    PT Home Exercise Plan  seated hip flexion, supine bridge and SLR, side hip abduction, upright posture in standing- stand every 30 min, widen stance    Consulted and Agree with Plan of Care  Patient       Patient will benefit from skilled therapeutic intervention in order to improve the following deficits and impairments:  Abnormal gait, Difficulty walking, Cardiopulmonary status limiting activity, Pain, Decreased activity tolerance, Decreased strength, Decreased mobility  Visit Diagnosis: Muscle weakness (generalized)  Difficulty in walking, not elsewhere classified  Other abnormalities of gait and mobility     Problem List Patient Active Problem List   Diagnosis Date Noted  . CAD (coronary artery disease) 08/17/2019  . invasive SCC (squamous cell carcinoma), leg, left 07/31/2019  . Hypothyroidism 07/01/2019  . Closed fracture of left hip ([place]) 06/30/2019  . Hair loss 06/29/2019  . Acute on chronic systolic CHF (congestive heart failure) ([place]) 05/14/2019  . Sleep disturbance 04/29/2019  . Postoperative pain   . Tachycardia   . Steroid-induced hyperglycemia   . Labile blood pressure   . Leukocytosis   . E. coli UTI   . Hypoalbuminemia due to protein-calorie malnutrition ([place])   . Ocular myasthenia gravis ([place])   . Anemia   . Hip fracture ([place]) 03/10/2019  . Left displaced femoral  neck fracture ([place]) 03/10/2019  . Weakness 12/04/2018  . Hypokalemia 03/18/2018  . 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 Right ovarian cyst s/p RSO  03/17/2018  . Diarrhea 12/23/2017  . Large hiatal hernia 04/01/2017  . Sweating profusely 03/19/2017  . Hyperglycemia 03/19/2017  . Squamous cell skin cancer 11/05/2016  . Nosebleed 10/19/2016  . Osteopenia 08/28/2016  . Essential hypertension, benign 08/21/2016  . Long term current use of systemic steroids 08/21/2016  . Primary open angle glaucoma of both eyes, mild stage 05/14/2016  . Ocular hypertension of left eye 05/08/2016  . GERD (gastroesophageal reflux disease) 05/06/2016  . Macular pucker, bilateral 11/14/2015  . Status post intraocular lens implant 04/04/2015  . 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 Chronic pain syndrome 02/07/2015  . Bilateral nonexudative age-related macular degeneration 01/16/2015  . Presence of intraocular lens 01/16/2015  . Insomnia 07/26/2014  . Urge incontinence of urine 01/17/2014  . Urinary urgency 01/17/2014  . Malaise and fatigue 10/04/2013  . PAC (premature atrial contraction) 10/04/2013  . Depression 02/15/2013  . Myasthenia gravis ([place]) 03/17/2012  . Spinal stenosis, lumbar 01/14/2012  . Ptosis of eyelid 01/14/2012  . Pure hypercholesterolemia 09/05/2011  . Benign hypertensive heart disease without heart failure 09/05/2011  . Osteoarthritis 09/05/2011    Klarissa Mcilvain C. Tonatiuh Mallon PT, DPT 08/26/19 11:01 AM   [place] [facility] [address] [place], [place], [address] Phone: [phone]   Fax:  [phone]  Name: AYOKA SPAKE MRN: [id number] Date of Birth: February 23, 1925

## 2019-08-26 NOTE — Progress Notes (Signed)
[facility]   [phone] Fax:([phone]) [phone]   Clinic Follow up Note   Patient Care Team: Binnie Rail, MD as PCP - General (Internal Medicine) Nahser, Wonda Cheng, MD as PCP - Cardiology (Cardiology) Michael Boston, MD as Consulting Physician (General Surgery) Milus Banister, MD as Attending Physician (Gastroenterology) Nahser, Wonda Cheng, MD as Consulting Physician (Cardiology) Alda Berthold, DO as Consulting Physician (Neurology) Feliz Beam, MD as Referring Physician (Ophthalmology)  Date of Service:  08/30/2019  CHIEF COMPLAINT: F/u of anemia  SUMMARY OF ONCOLOGIC HISTORY: Oncology History Overview Note  Cancer Staging 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 Staging form: Colon and Rectum, AJCC 8th Edition - Pathologic stage from 03/20/2018: Stage IIA (pT3, pN0, cM0) - Signed by Truitt Merle, MD on 07/20/2019    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  03/17/2018 Initial Diagnosis   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   03/20/2018 Cancer Staging   Staging form: Colon and Rectum, AJCC 8th Edition - Pathologic stage from 03/20/2018: Stage IIA (pT3, pN0, cM0) - Signed by Truitt Merle, MD on 07/20/2019   03/05/2019 Initial Biopsy   Diagnosis Surgical [P], cecum mass - INVASIVE MODERATELY DIFFERENTIATED ADENOCARCINOMA. SEE NOTE.   03/18/2019 Pathology Results   Hemicolectomy  Diagnosis 1. Colon, segmental resection for tumor, right colon - INVASIVE COLORECTAL ADENOCARCINOMA, 7.6 CM. - TUMOR EXTENDS INTO SUBSEROSAL CONNECTIVE TISSUE. - MARGINS NOT INVOLVED. - THIRTY-FOUR LYMPH NODES WITH NO METASTATIC CARCINOMA IDENTIFIED (0/34). - THREE SEPARATE TUBULAR ADENOMAS. - BENIGN OVARIAN SEROUS CYSTADENOMA. 2. Skin , skin mass - SEBORRHEIC KERATOSIS. - NO EVIDENCE OF MALIGNANCY.      CURRENT THERAPY:  Oral iron BID  INTERVAL HISTORY:  Tamara Gregory is here for a follow up. She presents to the clinic  alone. She notes her husband brought her but could not come in. He was called to be included in the visit. He notes she is doing better lately. She is having difficulty gaining strength. She does PT twice a week. She has been taking oral iron twice a day. She denies issues with BMs. She denies any GI bleeding. She notes she had possible mild MI when she had past hip injury. She did not notice it.    REVIEW OF SYSTEMS:   Constitutional: Denies fevers, chills or abnormal weight loss Eyes: Denies blurriness of vision Ears, nose, mouth, throat, and face: Denies mucositis or sore throat Respiratory: Denies cough, dyspnea or wheezes Cardiovascular: Denies palpitation, chest discomfort or lower extremity swelling Gastrointestinal:  Denies nausea, heartburn or change in bowel habits Skin: Denies abnormal skin rashes Lymphatics: Denies new lymphadenopathy or easy bruising Neurological:Denies numbness, tingling or new weaknesses Behavioral/Psych: Mood is stable, no new changes  All other systems were reviewed with the patient and are negative.  MEDICAL HISTORY:  Past Medical History:  Diagnosis Date  . Anemia   . Anxiety   . Arthritis   . Cancer (HCC)    hx of skin cancer , colon   . Cataract    removed both eyes   . Chronic back pain    okay with sitting, increased back pain with activity   . Depression   . GERD (gastroesophageal reflux disease)   . Glaucoma   . Headache(784.0)    hx of  . History of kidney stones   . History of migraines   . Hyperlipidemia   . Hypertension   . Macular degeneration   . Myasthenia gravis (  HCC)   . Occasional tremors   . Pneumonia   . Spinal stenosis   . Thoracic aneurysm    4.4cm; see CT done 01/28/12 and 01/22/13 in [facility].   [name] Uses hearing aid   . Vitamin D deficiency     SURGICAL HISTORY: Past Surgical History:  Procedure Laterality Date  . ABDOMINAL HYSTERECTOMY    . CHOLECYSTECTOMY    . COLON SURGERY     Right colectomy dr. gross 03-17-18   . HIP ARTHROPLASTY Left 03/11/2019   Procedure: ARTHROPLASTY BIPOLAR HIP (HEMIARTHROPLASTY);  Surgeon: Renette Butters, MD;  Location: [facility];  Service: Orthopedics;  Laterality: Left;  . LUMBAR LAMINECTOMY/DECOMPRESSION MICRODISCECTOMY N/A 02/04/2013   Procedure: CENTRAL DECOMPRESSION/LUMBAR LAMINECTOMY L3-L4 AND L4-L5 2 LEVELS;  Surgeon: Tobi Bastos, MD;  Location: [facility];  Service: Orthopedics;  Laterality: N/A;  . SPINAL CORD STIMULATOR IMPLANT     in right hip but not currently using because it did not help  . TONSILLECTOMY    . UPPER GASTROINTESTINAL ENDOSCOPY    . US ECHOCARDIOGRAPHY  05/18/2007   EF 55-60%    I have reviewed the social history and family history with the patient and they are unchanged from previous note.  ALLERGIES:  is allergic to tramadol.  MEDICATIONS:  Current Outpatient Medications  Medication Sig Dispense Refill  . acetaminophen (TYLENOL) 325 MG tablet Take 2 tablets (650 mg total) by mouth every 6 (six) hours as needed for headache.    [name] aspirin 81 MG chewable tablet Chew 0.5 tablets (40.5 mg total) by mouth daily. 30 tablet 0  . brimonidine (ALPHAGAN) 0.2 % ophthalmic solution Place 1 drop into both eyes 3 (three) times daily.     . Cholecalciferol (VITAMIN D) 125 MCG (5000 UT) CAPS Take 5,000 Units by mouth daily.     . fluticasone (FLONASE) 50 MCG/ACT nasal spray Place 1 spray into both nostrils at bedtime.    . furosemide (LASIX) 20 MG tablet Take 2 tablets (40 mg total) by mouth daily. 90 tablet 3  . latanoprost (XALATAN) 0.005 % ophthalmic solution Place 1 drop into the left eye at bedtime.  1  . levothyroxine (SYNTHROID) 25 MCG tablet Take 1 tablet (25 mcg total) by mouth daily before breakfast. 30 tablet 5  . losartan (COZAAR) 25 MG tablet Take 1 tablet (25 mg total) by mouth daily. 90 tablet 3  . Multiple Vitamins-Minerals (PRESERVISION AREDS) CAPS Take 1 capsule by mouth 2 (two) times daily.     . Omega-3 Fatty Acids (FISH OIL) 435 MG CAPS  Take 435 mg by mouth daily.    . potassium chloride SA (K-DUR) 20 MEQ tablet Dissolve in a small amount of water (may take 2 min to dissolve) then add to a small amount of apple sauce or pudding and eat right away 30 tablet 3  . predniSONE (DELTASONE) 5 MG tablet Take 3 tablets (15 mg total) by mouth daily with breakfast. 270 tablet 3  . pyridostigmine (MESTINON) 60 MG tablet Take 1 tablet at 8am and 3pm. 180 tablet 3  . rosuvastatin (CRESTOR) 5 MG tablet TAKE 1 TABLET BY MOUTH  EVERY DAY IN THE EVENING 90 tablet 1  . sertraline (ZOLOFT) 100 MG tablet Take 200 mg by mouth at bedtime. Take 2 tabs at bedtime     No current facility-administered medications for this visit.     PHYSICAL EXAMINATION: ECOG PERFORMANCE STATUS: 2 - Symptomatic, <50% confined to bed  Vitals:   08/30/19 1128  BP:  123/81  Pulse: 84  Resp: 18  Temp: 99.1 F (37.3 C)  SpO2: 93%   Filed Weights   08/30/19 1128  Weight: 142 lb 6.4 oz (64.6 kg)    GENERAL:alert, no distress and comfortable SKIN: skin color, texture, turgor are normal, no rashes or significant lesions EYES: normal, Conjunctiva are pink and non-injected, sclera clear  NECK: supple, thyroid normal size, non-tender, without nodularity LYMPH:  no palpable lymphadenopathy in the cervical, axillary  LUNGS: clear to auscultation and percussion with normal breathing effort HEART: regular rate & rhythm and no murmurs and no lower extremity edema ABDOMEN:abdomen soft, non-tender and normal bowel sounds Musculoskeletal:no cyanosis of digits and no clubbing  NEURO: alert & oriented x 3 with fluent speech, no focal motor/sensory deficits  LABORATORY DATA:  I have reviewed the data as listed CBC Latest Ref Rng & Units 08/30/2019 08/16/2019 07/20/2019  WBC 4.0 - 10.5 K/uL 9.6 7.0 7.4  Hemoglobin 12.0 - 15.0 g/dL 12.3 11.8(L) 11.0(L)  Hematocrit 36.0 - 46.0 % 39.3 36.2 37.1  Platelets 150 - 400 K/uL 207 208.0 259     CMP Latest Ref Rng & Units 07/20/2019  06/29/2019 06/22/2019  Glucose 70 - 99 mg/dL 124(H) 104(H) 163(H)  BUN 8 - 23 mg/dL 17 15 14   Creatinine 0.44 - 1.00 mg/dL 0.77 0.74 0.76  Sodium 135 - 145 mmol/L 141 135 136  Potassium 3.5 - 5.1 mmol/L 4.4 4.4 3.3(L)  Chloride 98 - 111 mmol/L 104 99 92(L)  CO2 22 - 32 mmol/L 26 29 25   Calcium 8.9 - 10.3 mg/dL 10.0 10.0 10.0  Total Protein 6.5 - 8.1 g/dL 7.3 6.6 -  Total Bilirubin 0.3 - 1.2 mg/dL 0.4 0.5 -  Alkaline Phos 38 - 126 U/L 68 61 -  AST 15 - 41 U/L 22 16 -  ALT 0 - 44 U/L 17 10 -      RADIOGRAPHIC STUDIES: I have personally reviewed the radiological images as listed and agreed with the findings in the report. No results found.   ASSESSMENT & PLAN:  MARQUITE TORMA is a 85 y.o. female with   1. Iron deficient Anemia  -She has had hg in 8-11 range since 2018. Her CBC was normal in 10/2016. Normal MCV. Ferritin was low at 8.1 in 12/2017, and 19 in 06/2019 with low serum iron and saturation. She has lab evidence of iron deficiency. B12 was normal in 06/2019.  -she was diagnosed with right colon cancer in 03/2018, her iron deficient anemia is likely related to her colon cancer. However since her colon surgery, due to inadequate iron supplement -she has been on oral iron ferous sulfate BID.  -Labs reviewed, CBC WNL except ANC 7.8, lymphocytes 0.6. Iron panel still pending. Physical exam unremarkable. She responded well to oral iron, anemia resolved now  -Continue oral iron BID.  -She opted for flu shot today  -F/u in 6 months   2. H/o right colon cancer in 03/2018, pT3N0Mo stage IIA -Diagnosed in 03/2018, invasive moderate differentiated adenocarcinoma. She is s/p hemicolectomy on 03/18/19.  -Given early stage cancer she did not need adjuvant chemo.  -continue colon cancer surveillance.  Due to her advanced age, and early stage disease, I do not plan to have routine surveillance CT scan. -She will f/u with Dr Ardis Hughs tomorrow and I encouraged her to ask if she needs a colonoscopy.  She has not had one since her colon surgery. Due to her advanced age and early stage disease, it's reasonable to  hold it for now since her anemia and IDA resolved   3. H/o skin cancer  -She was diagnosed with Squamous cell carcinoma of left forearm in 10/2016, of right foot in 04/2018 and of upper back in 12/2018. All were removed.  -She will continue to f/u with her dermatologist  4. Chronic back pain  -She had pain pump placed before.  -Pain is controlled, she denies being in pain currently.   5. Social Support  -She does not take care of herself independently. She lives with her husband who takes care of her and is her primary historian.  -She broke her left hip in 03/2019 and had surgery. She now ambulates with walker   PLAN:  -flu shot today  -lab reviewed, anemia resolved now.  -Lab and f/u in 6 months  -Continue oral iron ferrous sulfate BID.  -I called her husband during her visit   No problem-specific Assessment & Plan notes found for this encounter.   No orders of the defined types were placed in this encounter.  All questions were answered. The patient knows to call the clinic with any problems, questions or concerns. No barriers to learning was detected. I spent 20 minutes counseling the patient face to face. The total time spent in the appointment was 25 minutes and more than 50% was on counseling and review of test results     Truitt Merle, MD 08/30/2019   I, Tamara Gregory, am acting as scribe for Truitt Merle, MD.   I have reviewed the above documentation for accuracy and completeness, and I agree with the above.

## 2019-08-27 ENCOUNTER — Encounter: Payer: Self-pay | Admitting: Neurology

## 2019-08-27 ENCOUNTER — Other Ambulatory Visit: Payer: Self-pay

## 2019-08-27 ENCOUNTER — Ambulatory Visit (INDEPENDENT_AMBULATORY_CARE_PROVIDER_SITE_OTHER): Payer: Medicare Other | Admitting: Neurology

## 2019-08-27 VITALS — BP 110/70 | HR 78 | Ht 62.0 in | Wt 145.0 lb

## 2019-08-27 DIAGNOSIS — G7000 Myasthenia gravis without (acute) exacerbation: Secondary | ICD-10-CM

## 2019-08-27 MED ORDER — PYRIDOSTIGMINE BROMIDE 60 MG PO TABS: ORAL_TABLET | ORAL | 3 refills | Status: DC

## 2019-08-27 MED ORDER — PREDNISONE 5 MG PO TABS: 15.0000 mg | ORAL_TABLET | Freq: Every day | ORAL | 3 refills | Status: DC

## 2019-08-27 NOTE — Patient Instructions (Signed)
Start prednisone 15mg  daily  Start mestinon 60mg  at 8am and 3pm  Telephone follow-up in 4 weeks

## 2019-08-27 NOTE — Progress Notes (Signed)
Follow-up Visit   Date: 08/27/19    Tamara Gregory MRN: [id number] DOB: 01-27-1925   Interim History: Tamara Gregory is a 85 y.o. Caucasian female with hypertension, depression, GERD, hyperlipidemia, chronic low back pain s/p spinal cord stimulator, and colon cancer s/p resection returning to the clinic for follow-up of ocular myasthenia.  The patient was accompanied to the clinic by husband who also provides collateral information.    History of present illness: She was diagnosed with myasthenia in 2013 by Dr. Vallarie Mare at [facility] by [facility] antibody testing. CT chest was negative.  Symptoms manifested with R > L ptosis.  She did not have any double vision, difficulty swallowing/talking, shortness of breath, or limb weakness.  She did not benefit with mestinon and discontinued this due to symptoms of malaise.  Prednisone 20mg  was started and slowly tapered to 5mg  in December 2014.  She has been on this dose for the past 3 years and reports having no exacerbations with MG.  She continues to have mild ptosis bilaterally, but it does not bother her.  Again, she denies double vision.   In late 2018, I offered to taper her prednisone and she did well on 4mg  daily, but started to have worsening ptosis with 3mg .  In 2019, her prednisone was increased gradually to a maximal of 25mg /d which helped her ptosis.   UPDATE 11/19/2018:  She is here for follow-up visit. She returned to [facility] [place] and is eager to return to her apartment. She lives alone and manages her ADLs.  No home safety issues by patient or her husband.  She has been taking prednisone 20mg  for the past few months and has noticed slight improvement such that her right eye is less droopy.  It is still not completely open, but she does not have spells of complete closure anymore.  She does not have double vision, difficulty swallowing/talking or weakness. Her bigger concern is ongoing severe low back pain.  She is seeing Dr. Maryjean Ka next week for [facility].  Mood has been low because of her limited functioning by pain.    UPDATE 01/27/2019:   She scheduled sooner visit because she developed new visual and auditory hallucinations, music in her head, and intermittent tremors of the hand at night.  Her husband stopped tramadol and symptoms improved. There was no fever or illness during this time.  She was taking tramadol for her arthritis pain. Her right ptosis is doing well on prednisone 15mg , no double vision or swallowing difficulty.  She has lack of interests in puzzles and reading, which she previously enjoyed and has become less engaged.  Husband states that she can sleep up to 14 hours per day.   UPDATE 08/27/2019:  She is here with worsening double vision and right ptosis.  She has been on prednisone 5mg  for several months and was doing well until two weeks ago, when her double vision returned. She is complaining of difficulty with reading the newspaper and watching TV.  She denies any double vision, shortness of breath, or limb weakness.  She saw ophthalmology for these symptoms who recommended that she return to see as symptoms were consistent with ocular MG. She fractured her left hip in April and is recovering from that. She uses a walker at all times and wheelchair for long distances.   Medications:  Current Outpatient Medications on File Prior to Visit  Medication Sig Dispense Refill   acetaminophen (TYLENOL) 325 MG tablet Take  2 tablets (650 mg total) by mouth every 6 (six) hours as needed for headache.     aspirin 81 MG chewable tablet Chew 0.5 tablets (40.5 mg total) by mouth daily. 30 tablet 0   brimonidine (ALPHAGAN) 0.2 % ophthalmic solution Place 1 drop into both eyes 3 (three) times daily.      Cholecalciferol (VITAMIN D) 125 MCG (5000 UT) CAPS Take 5,000 Units by mouth daily.      fluticasone (FLONASE) 50 MCG/ACT nasal spray Place 1 spray into both nostrils at bedtime.     furosemide  (LASIX) 20 MG tablet Take 2 tablets (40 mg total) by mouth daily. 90 tablet 3   latanoprost (XALATAN) 0.005 % ophthalmic solution Place 1 drop into the left eye at bedtime.  1   levothyroxine (SYNTHROID) 25 MCG tablet Take 1 tablet (25 mcg total) by mouth daily before breakfast. 30 tablet 5   losartan (COZAAR) 25 MG tablet Take 1 tablet (25 mg total) by mouth daily. 90 tablet 3   Multiple Vitamins-Minerals (PRESERVISION AREDS) CAPS Take 1 capsule by mouth 2 (two) times daily.      Omega-3 Fatty Acids (FISH OIL) 435 MG CAPS Take 435 mg by mouth daily.     potassium chloride SA (K-DUR) 20 MEQ tablet Dissolve in a small amount of water (may take 2 min to dissolve) then add to a small amount of apple sauce or pudding and eat right away 30 tablet 3   rosuvastatin (CRESTOR) 5 MG tablet TAKE 1 TABLET BY MOUTH  EVERY DAY IN THE EVENING 90 tablet 1   sertraline (ZOLOFT) 100 MG tablet Take 200 mg by mouth at bedtime. Take 2 tabs at bedtime     No current facility-administered medications on file prior to visit.     Allergies:  Allergies  Allergen Reactions   Tramadol Other (See Comments)    delirium    Review of Systems:  CONSTITUTIONAL: No fevers, chills, night sweats, +weight loss.  EYES: +visual changes or eye pain ENT: No hearing changes.  No history of nose bleeds.   RESPIRATORY: No cough, wheezing and shortness of breath.   CARDIOVASCULAR: Negative for chest pain, and palpitations.   GI: Negative for abdominal discomfort, blood in stools or black stools.   GU:  No history of incontinence.   MUSCLOSKELETAL: +history of joint pain or swelling.  No myalgias.   SKIN: Negative for lesions, rash, and itching.   ENDOCRINE: Negative for cold or heat intolerance, polydipsia or goiter.   PSYCH:  No depression or anxiety symptoms.   NEURO: As Above.   Vital Signs:  BP 110/70    Pulse 78    Ht 5\' 2"  (1.575 m)    Wt 145 lb (65.8 kg)    SpO2 98%    BMI 26.52 kg/m   General Medical Exam:    General:  Elderly appearing, comfortable   Neurological Exam: MENTAL STATUS including orientation to time, place, person, recent and remote memory, attention span and concentration, language, and fund of knowledge is normal.  Speech is not dysarthric.  CRANIAL NERVES:   Pupils equal round and reactive to light.  Normal conjugate, extra-ocular eye movements in all directions of gaze, except right eye is restricted with lateral gaze. Moderate right ptosis with worsening with sustained upgaze. Facial muscles are 5/5.   Face is symmetric. Palate elevates symmetrically.  Tongue is midline.  MOTOR:  Motor strength is 5/5 in all extremities, no fatigability.  No pronator drift.  Tone is  normal. Trace tremors of the hands when outstretched  COORDINATION/GAIT:  Arrived in wheelchair today, gait not tested  Data: Labs 01/2012:  + ACh R antibody at .81 (low range of + >.3)  IMPRESSION/PLAN: Seropositive ocular myasthenia gravis with exacerbation, diagnosed 2013 at [facility] by Dr. Vallarie Mare.  She was doing well on prednisone 5mg  until 2 weeks ago when she diplopia and right ptosis became bothersome. Increase prednisone 15mg  daily.  She has been as high has prednisone 25mg  in the past Continue Mestinon 60 mg twice daily at 8 AM and 3 PM.  Long-term corticosteroid use.   Continue Pepcid and calcium with vitamin D supplements.     Telephone visit in 4 weeks  Greater than 50% of this 20 minute visit was spent in counseling, explanation of diagnosis, planning of further management, and coordination of care.   Thank you for allowing me to participate in patient's care.  If I can answer any additional questions, I would be pleased to do so.    Sincerely,    Marlaya Turck K. Posey Pronto, DO

## 2019-08-29 DIAGNOSIS — D649 Anemia, unspecified: Secondary | ICD-10-CM | POA: Insufficient documentation

## 2019-08-29 DIAGNOSIS — D500 Iron deficiency anemia secondary to blood loss (chronic): Secondary | ICD-10-CM | POA: Insufficient documentation

## 2019-08-30 ENCOUNTER — Other Ambulatory Visit: Payer: Self-pay

## 2019-08-30 ENCOUNTER — Inpatient Hospital Stay: Payer: Medicare Other | Attending: Hematology

## 2019-08-30 ENCOUNTER — Telehealth: Payer: Self-pay | Admitting: Hematology

## 2019-08-30 ENCOUNTER — Encounter: Payer: Self-pay | Admitting: Hematology

## 2019-08-30 ENCOUNTER — Inpatient Hospital Stay (HOSPITAL_BASED_OUTPATIENT_CLINIC_OR_DEPARTMENT_OTHER): Payer: Medicare Other | Admitting: Hematology

## 2019-08-30 VITALS — BP 123/81 | HR 84 | Temp 99.1°F | Resp 18 | Ht 62.0 in | Wt 142.4 lb

## 2019-08-30 DIAGNOSIS — D500 Iron deficiency anemia secondary to blood loss (chronic): Secondary | ICD-10-CM

## 2019-08-30 DIAGNOSIS — C180 Malignant neoplasm of cecum: Secondary | ICD-10-CM

## 2019-08-30 DIAGNOSIS — Z23 Encounter for immunization: Secondary | ICD-10-CM | POA: Insufficient documentation

## 2019-08-30 DIAGNOSIS — Z85038 Personal history of other malignant neoplasm of large intestine: Secondary | ICD-10-CM | POA: Diagnosis not present

## 2019-08-30 DIAGNOSIS — D509 Iron deficiency anemia, unspecified: Secondary | ICD-10-CM | POA: Insufficient documentation

## 2019-08-30 LAB — IRON AND TIBC
Iron: 87 ug/dL (ref 41–142)
Saturation Ratios: 23 % (ref 21–57)
TIBC: 383 ug/dL (ref 236–444)
UIBC: 295 ug/dL (ref 120–384)

## 2019-08-30 LAB — CBC WITH DIFFERENTIAL (CANCER CENTER ONLY)
Abs Immature Granulocytes: 0.05 10*3/uL (ref 0.00–0.07)
Basophils Absolute: 0 10*3/uL (ref 0.0–0.1)
Basophils Relative: 0 %
Eosinophils Absolute: 0 10*3/uL (ref 0.0–0.5)
Eosinophils Relative: 0 %
HCT: 39.3 % (ref 36.0–46.0)
Hemoglobin: 12.3 g/dL (ref 12.0–15.0)
Immature Granulocytes: 1 %
Lymphocytes Relative: 7 %
Lymphs Abs: 0.6 10*3/uL — ABNORMAL LOW (ref 0.7–4.0)
MCH: 30.4 pg (ref 26.0–34.0)
MCHC: 31.3 g/dL (ref 30.0–36.0)
MCV: 97.3 fL (ref 80.0–100.0)
Monocytes Absolute: 1 10*3/uL (ref 0.1–1.0)
Monocytes Relative: 10 %
Neutro Abs: 7.8 10*3/uL — ABNORMAL HIGH (ref 1.7–7.7)
Neutrophils Relative %: 82 %
Platelet Count: 207 10*3/uL (ref 150–400)
RBC: 4.04 MIL/uL (ref 3.87–5.11)
RDW: 18 % — ABNORMAL HIGH (ref 11.5–15.5)
WBC Count: 9.6 10*3/uL (ref 4.0–10.5)
nRBC: 0 % (ref 0.0–0.2)

## 2019-08-30 LAB — RETIC PANEL
Immature Retic Fract: 17 % — ABNORMAL HIGH (ref 2.3–15.9)
RBC.: 4.05 MIL/uL (ref 3.87–5.11)
Retic Count, Absolute: 66 K/uL (ref 19.0–186.0)
Retic Ct Pct: 1.6 % (ref 0.4–3.1)
Reticulocyte Hemoglobin: 35.8 pg

## 2019-08-30 LAB — FERRITIN: Ferritin: 49 ng/mL (ref 11–307)

## 2019-08-30 LAB — CEA (IN HOUSE-CHCC): CEA (CHCC-In House): 6.74 ng/mL — ABNORMAL HIGH (ref 0.00–5.00)

## 2019-08-30 MED ORDER — INFLUENZA VAC A&B SA ADJ QUAD 0.5 ML IM PRSY
0.5000 mL | PREFILLED_SYRINGE | Freq: Once | INTRAMUSCULAR | Status: AC
  Administered 2019-08-30: 12:00:00 0.5 mL via INTRAMUSCULAR

## 2019-08-30 NOTE — Telephone Encounter (Signed)
Per 9/27 sch message - pt came in for todays appt -

## 2019-08-30 NOTE — Telephone Encounter (Signed)
Spoke with pt spouse and scheduled appts per 9/28 los.  He is aware of the appt date and time.

## 2019-08-31 ENCOUNTER — Ambulatory Visit (INDEPENDENT_AMBULATORY_CARE_PROVIDER_SITE_OTHER): Payer: Medicare Other | Admitting: Gastroenterology

## 2019-08-31 ENCOUNTER — Encounter: Payer: Self-pay | Admitting: Gastroenterology

## 2019-08-31 ENCOUNTER — Ambulatory Visit: Payer: Medicare Other | Admitting: Physical Therapy

## 2019-08-31 ENCOUNTER — Encounter: Payer: Self-pay | Admitting: Physical Therapy

## 2019-08-31 VITALS — BP 106/66 | HR 88 | Temp 98.5°F | Ht 63.0 in | Wt 142.0 lb

## 2019-08-31 DIAGNOSIS — R2689 Other abnormalities of gait and mobility: Secondary | ICD-10-CM

## 2019-08-31 DIAGNOSIS — M6281 Muscle weakness (generalized): Secondary | ICD-10-CM | POA: Diagnosis not present

## 2019-08-31 DIAGNOSIS — K6389 Other specified diseases of intestine: Secondary | ICD-10-CM

## 2019-08-31 DIAGNOSIS — R262 Difficulty in walking, not elsewhere classified: Secondary | ICD-10-CM

## 2019-08-31 DIAGNOSIS — S72002A Fracture of unspecified part of neck of left femur, initial encounter for closed fracture: Secondary | ICD-10-CM | POA: Diagnosis not present

## 2019-08-31 NOTE — Patient Instructions (Signed)
   Walking: feet wide & chest lifted

## 2019-08-31 NOTE — Therapy (Addendum)
[place], [place], [address] Phone: [phone]   Fax:  [phone]  Physical Therapy Treatment Progress Note Reporting Period 07/19/2019 to 08/31/2019  See note below for Objective Data and Assessment of Progress/Goals.       Patient Details  Name: Tamara Gregory MRN: [id number] Date of Birth: 05/24/1925 Referring Provider (PT): Jamse Arn MD   Encounter Date: 08/31/2019  PT End of Session - 08/31/19 1021    Visit Number  10    Number of Visits  13    Date for PT Re-Evaluation  09/17/19    Authorization Type  [employer]    PT Start Time  [id number]    PT Stop Time  1103    PT Time Calculation (min)  42 min       Past Medical History:  Diagnosis Date  . Anemia   . Anxiety   . Arthritis   . Cancer (HCC)    hx of skin cancer , colon   . Cataract    removed both eyes   . Chronic back pain    okay with sitting, increased back pain with activity   . Depression   . GERD (gastroesophageal reflux disease)   . Glaucoma   . Headache(784.0)    hx of  . History of kidney stones   . History of migraines   . Hyperlipidemia   . Hypertension   . Macular degeneration   . Myasthenia gravis ([place])   . Occasional tremors   . Pneumonia   . Spinal stenosis   . Thoracic aneurysm    4.4cm; see CT done 01/28/12 and 01/22/13 in [facility].   [name] Uses hearing aid   . Vitamin D deficiency     Past Surgical History:  Procedure Laterality Date  . ABDOMINAL HYSTERECTOMY    . CHOLECYSTECTOMY    . COLON SURGERY     Right colectomy dr. gross 03-17-18  . HIP ARTHROPLASTY Left 03/11/2019   Procedure: ARTHROPLASTY BIPOLAR HIP (HEMIARTHROPLASTY);  Surgeon: Renette Butters, MD;  Location: [place];  Service: Orthopedics;  Laterality: Left;  . LUMBAR LAMINECTOMY/DECOMPRESSION MICRODISCECTOMY N/A 02/04/2013   Procedure: CENTRAL DECOMPRESSION/LUMBAR LAMINECTOMY L3-L4 AND L4-L5 2 LEVELS;  Surgeon: Tobi Bastos, MD;  Location: [facility];   Service: Orthopedics;  Laterality: N/A;  . SPINAL CORD STIMULATOR IMPLANT     in right hip but not currently using because it did not help  . TONSILLECTOMY    . UPPER GASTROINTESTINAL ENDOSCOPY    . US ECHOCARDIOGRAPHY  05/18/2007   EF 55-60%    There were no vitals filed for this visit.  Subjective Assessment - 08/31/19 1024    Subjective  I did have some aching in my left hip when I was doing my leg exercises. My husband thinks I am doing better (walking at home), I guess I am improving. Back pain is not as bad. Getting dressed completely exhausts me.    Pertinent History  Ocular Myasthenia Gravis, HTN, PAC, CHF, OA, hip fx, leukocytosi, MI, anemia tachycardia, macular degeneration, spinal stenosis, GERD, colon CA surgery,    Currently in Pain?  No/denies         [facility] PT Assessment - 08/31/19 0001      Assessment   Medical Diagnosis  L hip fx, difficulty walking    Referring Provider (PT)  Jamse Arn MD    Onset Date/Surgical Date  03/05/19    Hand Dominance  Right  Prior Function   Level of Independence  Needs assistance with ADLs      Cognition   Overall Cognitive Status  Within Functional Limits for tasks assessed      Posture/Postural Control   Posture Comments  rounded shoulders, forward head      Berg Balance Test   Sit to Stand  Needs minimal aid to stand or to stabilize    Standing Unsupported  Able to stand 30 seconds unsupported    Sitting with Back Unsupported but Feet Supported on Floor or Stool  Able to sit safely and securely 2 minutes    Stand to Sit  Controls descent by using hands    Transfers  Able to transfer safely, definite need of hands    Standing Unsupported with Eyes Closed  Needs help to keep from falling    Standing Unsupported with Feet Together  Needs help to attain position and unable to hold for 15 seconds    From Standing, Reach Forward with Outstretched Arm  Loses balance while trying/requires external support    From  Standing Position, Pick up Object from Floor  Unable to try/needs assist to keep balance    From Standing Position, Turn to Look Behind Over each Shoulder  Needs supervision when turning    Turn 360 Degrees  Needs close supervision or verbal cueing    Standing Unsupported, Alternately Place Feet on Step/Stool  Needs assistance to keep from falling or unable to try    Standing Unsupported, One Foot in Front  Loses balance while stepping or standing    Standing on One Leg  Unable to try or needs assist to prevent fall    Total Score  15                   [facility] Adult PT Treatment/Exercise - 08/31/19 0001      Exercises   Exercises  Other Exercises    Other Exercises   frequent rest breaks with sit<>stand and transitioning bw motions      Knee/Hip Exercises: Aerobic   Nustep  5 min L3 LE only      Knee/Hip Exercises: Standing   Heel Raises  10 reps;2 sets;Both    Hip Abduction  Knee straight;10 reps;Both    Abduction Limitations  UE support             PT Education - 08/31/19 1046    Education Details  BERG, need for regular movement along with exercise to get stronger    Person(s) Educated  Patient    Methods  Explanation    Comprehension  Verbalized understanding          PT Long Term Goals - 08/31/19 1445      PT LONG TERM GOAL #1   Title  Pt will be independent in her HEP and progression.    Baseline  reports doing her exercises in bed in the morning    Status  On-going      PT LONG TERM GOAL #2   Title  Pt will improve BERG balance score to > 30/56 for improved mobility (07/19/2019)    Baseline  15/56 today    Status  On-going      PT LONG TERM GOAL #3   Title  Pt will improve her L LE strength to >/= 4/5 in order to improve gait and functional mobility. (07/19/2019)    Baseline  see flowsheet    Status  On-going  PT LONG TERM GOAL #4   Title  Pt will be able to ambulate > 300' with rollator walker modified independent for improved mobility  (07/19/2019)    Baseline  CGA with multiple cues for posture & keeping RW close    Status  On-going            Plan - 08/31/19 1046    Clinical Impression Statement  Broke exercises into groups of 2 to do at breakfast, lunch and dinner to avoid exhaustion. Still requires multiple cues for upright posture as well as wider stance to reduce stepping on her own feet. Bends forward to reach for object rather than stepping closer and dangers of this was reinforced. Very minimal tolerance to exercise and required multiple breaks during BERG due to fatigue & SOB.    PT Treatment/Interventions  ADLs/Self Care Home Management;Moist Heat;Gait training;Functional mobility training;Therapeutic activities;Therapeutic exercise;Balance training;Patient/family education;Neuromuscular re-education;Manual techniques;Passive range of motion    PT Next Visit Plan  work in // bars or with RW    PT Home Exercise Plan  breakfast: bridge & SLR; Lunch: seated march & LAQ; Dinner: counter hip abd & heel raises; stand every 30 min, walking: chest lifted & wide stance    Consulted and Agree with Plan of Care  Patient       Patient will benefit from skilled therapeutic intervention in order to improve the following deficits and impairments:  Abnormal gait, Difficulty walking, Cardiopulmonary status limiting activity, Pain, Decreased activity tolerance, Decreased strength, Decreased mobility  Visit Diagnosis: Muscle weakness (generalized)  Difficulty in walking, not elsewhere classified  Other abnormalities of gait and mobility     Problem List Patient Active Problem List   Diagnosis Date Noted  . Iron deficiency anemia due to chronic blood loss 08/29/2019  . CAD (coronary artery disease) 08/17/2019  . invasive SCC (squamous cell carcinoma), leg, left 07/31/2019  . Hypothyroidism 07/01/2019  . Closed fracture of left hip ([place]) 06/30/2019  . Hair loss 06/29/2019  . Acute on chronic systolic CHF (congestive  heart failure) ([place]) 05/14/2019  . Sleep disturbance 04/29/2019  . Postoperative pain   . Tachycardia   . Steroid-induced hyperglycemia   . Labile blood pressure   . Leukocytosis   . E. coli UTI   . Hypoalbuminemia due to protein-calorie malnutrition ([place])   . Ocular myasthenia gravis ([place])   . Anemia   . Hip fracture ([place]) 03/10/2019  . Left displaced femoral neck fracture ([place]) 03/10/2019  . Weakness 12/04/2018  . Hypokalemia 03/18/2018  . 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 Right ovarian cyst s/p RSO 03/17/2018  . Diarrhea 12/23/2017  . Large hiatal hernia 04/01/2017  . Sweating profusely 03/19/2017  . Hyperglycemia 03/19/2017  . Squamous cell skin cancer 11/05/2016  . Nosebleed 10/19/2016  . Osteopenia 08/28/2016  . Essential hypertension, benign 08/21/2016  . Long term current use of systemic steroids 08/21/2016  . Primary open angle glaucoma of both eyes, mild stage 05/14/2016  . Ocular hypertension of left eye 05/08/2016  . GERD (gastroesophageal reflux disease) 05/06/2016  . Macular pucker, bilateral 11/14/2015  . Status post intraocular lens implant 04/04/2015  . Exudative age-related macular degeneration ([place]) 04/04/2015  . Chronic pain syndrome 02/07/2015  . Bilateral nonexudative age-related macular degeneration 01/16/2015  . Presence of intraocular lens 01/16/2015  . Insomnia 07/26/2014  . Urge incontinence of urine 01/17/2014  . Urinary urgency 01/17/2014  . Malaise and fatigue 10/04/2013  . PAC (premature atrial contraction) 10/04/2013  .  Depression 02/15/2013  . Myasthenia gravis ([place]) 03/17/2012  . Spinal stenosis, lumbar 01/14/2012  . Ptosis of eyelid 01/14/2012  . Pure hypercholesterolemia 09/05/2011  . Benign hypertensive heart disease without heart failure 09/05/2011  . Osteoarthritis 09/05/2011    Blaiden Werth C. Haniya Fern PT, DPT 08/31/19 2:51 PM   [facility] [address] [place], [place], [address] Phone: [phone]   Fax:  [phone]  Name: Tamara Gregory MRN: [id number] Date of Birth: 10/08/1925

## 2019-08-31 NOTE — Progress Notes (Signed)
Review of pertinent gastrointestinal problems: 1.  Colon cancer.  Diagnosed during colonoscopy April 2019 during work-up for iron deficiency anemia, chronic diarrhea.  She underwent right hemicolectomy, T3 N0 M0 adenocarcinoma confirmed.   HPI: This is a very pleasant 85 year old woman whom I last saw at the time of a colonoscopy about a year and a half ago.  Blood work September 2020 show normal CBC, iron studies all normal.  She had hip surgery earlier this year and is still wheelchair-bound afterwards.  She is hoping to learn how to walk again with time.  She really has no troubles with her bowels or abdominal pains.  She takes a single Imodium every morning shortly after waking up.  No overt GI bleeding  Chief complaint is history of colon cancer  Her weight is down about 6 pounds since her last visit here a year and a half ago in this office.  ROS: complete GI ROS as described in HPI, all other review negative.  Constitutional:  No unintentional weight loss   Past Medical History:  Diagnosis Date  . Anemia   . Anxiety   . Arthritis   . Cancer (HCC)    hx of skin cancer , colon   . Cataract    removed both eyes   . Chronic back pain    okay with sitting, increased back pain with activity   . Depression   . GERD (gastroesophageal reflux disease)   . Glaucoma   . Headache(784.0)    hx of  . History of kidney stones   . History of migraines   . Hyperlipidemia   . Hypertension   . Macular degeneration   . Myasthenia gravis ([place])   . Occasional tremors   . Pneumonia   . Spinal stenosis   . Thoracic aneurysm    4.4cm; see CT done 01/28/12 and 01/22/13 in [facility].   [name] Uses hearing aid   . Vitamin D deficiency     Past Surgical History:  Procedure Laterality Date  . ABDOMINAL HYSTERECTOMY    . CHOLECYSTECTOMY    . COLON SURGERY     Right colectomy dr. gross 03-17-18  . HIP ARTHROPLASTY Left 03/11/2019   Procedure: ARTHROPLASTY BIPOLAR HIP (HEMIARTHROPLASTY);  Surgeon:  Renette Butters, MD;  Location: [place];  Service: Orthopedics;  Laterality: Left;  . LUMBAR LAMINECTOMY/DECOMPRESSION MICRODISCECTOMY N/A 02/04/2013   Procedure: CENTRAL DECOMPRESSION/LUMBAR LAMINECTOMY L3-L4 AND L4-L5 2 LEVELS;  Surgeon: Tobi Bastos, MD;  Location: [facility];  Service: Orthopedics;  Laterality: N/A;  . SPINAL CORD STIMULATOR IMPLANT     in right hip but not currently using because it did not help  . TONSILLECTOMY    . UPPER GASTROINTESTINAL ENDOSCOPY    . US ECHOCARDIOGRAPHY  05/18/2007   EF 55-60%    Current Outpatient Medications  Medication Sig Dispense Refill  . acetaminophen (TYLENOL) 325 MG tablet Take 2 tablets (650 mg total) by mouth every 6 (six) hours as needed for headache.    [name] aspirin 81 MG chewable tablet Chew 0.5 tablets (40.5 mg total) by mouth daily. 30 tablet 0  . brimonidine (ALPHAGAN) 0.2 % ophthalmic solution Place 1 drop into both eyes 3 (three) times daily.     . Cholecalciferol (VITAMIN D) 125 MCG (5000 UT) CAPS Take 5,000 Units by mouth daily.     . ferrous sulfate 325 (65 FE) MG tablet Take 325 mg by mouth 2 (two) times daily with a meal.    . fluticasone (FLONASE) 50  MCG/ACT nasal spray Place 1 spray into both nostrils at bedtime.    . furosemide (LASIX) 20 MG tablet Take 2 tablets (40 mg total) by mouth daily. 90 tablet 3  . latanoprost (XALATAN) 0.005 % ophthalmic solution Place 1 drop into the left eye at bedtime.  1  . levothyroxine (SYNTHROID) 25 MCG tablet Take 1 tablet (25 mcg total) by mouth daily before breakfast. 30 tablet 5  . loperamide (IMODIUM) 2 MG capsule Take 2 mg by mouth daily.    [name] losartan (COZAAR) 25 MG tablet Take 1 tablet (25 mg total) by mouth daily. 90 tablet 3  . Multiple Vitamins-Minerals (PRESERVISION AREDS) CAPS Take 1 capsule by mouth 2 (two) times daily.     . Omega-3 Fatty Acids (FISH OIL) 435 MG CAPS Take 435 mg by mouth daily.    . potassium chloride SA (K-DUR) 20 MEQ tablet Dissolve in a small amount of water  (may take 2 min to dissolve) then add to a small amount of apple sauce or pudding and eat right away 30 tablet 3  . predniSONE (DELTASONE) 5 MG tablet Take 3 tablets (15 mg total) by mouth daily with breakfast. 270 tablet 3  . pyridostigmine (MESTINON) 60 MG tablet Take 1 tablet at 8am and 3pm. 180 tablet 3  . rosuvastatin (CRESTOR) 5 MG tablet TAKE 1 TABLET BY MOUTH  EVERY DAY IN THE EVENING 90 tablet 1  . sertraline (ZOLOFT) 100 MG tablet Take 200 mg by mouth at bedtime. Take 2 tabs at bedtime     No current facility-administered medications for this visit.     Allergies as of 08/31/2019 - Review Complete 08/31/2019  Allergen Reaction Noted  . Tramadol Other (See Comments) 01/27/2019    Family History  Problem Relation Age of Onset  . Heart attack Father   . Diabetes Father   . Cancer Sister   . Breast cancer Sister   . Cancer Brother   . Lung cancer Brother   . Cancer Sister   . Liver cancer Sister   . Diabetes Sister   . Colon polyps Neg Hx   . Colon cancer Neg Hx   . Esophageal cancer Neg Hx   . Rectal cancer Neg Hx   . Stomach cancer Neg Hx   . Adrenal disorder Neg Hx     Social History   Socioeconomic History  . Marital status: Married    Spouse name: Not on file  . Number of children: 3  . Years of education: Not on file  . Highest education level: Not on file  Occupational History  . Occupation: retired  Scientific laboratory technician  . Financial resource strain: Not hard at all  . Food insecurity    Worry: Never true    Inability: Never true  . Transportation needs    Medical: No    Non-medical: No  Tobacco Use  . Smoking status: Never Smoker  . Smokeless tobacco: Never Used  Substance and Sexual Activity  . Alcohol use: No  . Drug use: No  . Sexual activity: Not Currently  Lifestyle  . Physical activity    Days per week: 0 days    Minutes per session: 0 min  . Stress: Rather much  Relationships  . Social connections    Talks on phone: More than three times a  week    Gets together: Once a week    Attends religious service: 1 to 4 times per year    Active member of club  or organization: Yes    Attends meetings of clubs or organizations: More than 4 times per year    Relationship status: Married  . Intimate partner violence    Fear of current or ex partner: No    Emotionally abused: No    Physically abused: No    Forced sexual activity: No  Other Topics Concern  . Not on file  Social History Narrative   Lives with husband in a one story home.  Has 2 sons.  Retired.  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  Education: college.  [employer].       Right handed      Physical Exam: BP 106/66   Pulse 88   Temp 98.5 F (36.9 C)   Ht 5\' 3"  (1.6 m)   Wt 142 lb (64.4 kg)   BMI 25.15 kg/m  Constitutional: Elderly, frail, sitting in a wheelchair, 100% mentally acute Psychiatric: alert and oriented x3 Abdomen: soft, nontender, nondistended, no obvious ascites, no peritoneal signs, normal bowel sounds No peripheral edema noted in lower extremities  Assessment and plan: 85 y.o. female with personal history of colon cancer  She understands it is generally recommended to repeat colonoscopy about 1 year after colon cancer surgery.  She is 71 however and she is not at all interested in a repeat colonoscopy unless it is absolutely necessary.  It is certainly not absolutely necessary.  She does not have any concerning GI symptoms.  I recommend that she stay on her Imodium once daily as it is helping her chronic diarrhea.  She knows to contact me if she has any concerning GI symptoms such as overt bleeding, significant abdominal pains, significant changes in her bowels.  Please see the "Patient Instructions" section for addition details about the plan.  Owens Loffler, MD [facility] 08/31/2019, 2:01 PM

## 2019-08-31 NOTE — Patient Instructions (Signed)
Please follow up with [place] as needed.  Thank you for entrusting me with your care and choosing [facility].  Dr Ardis Hughs

## 2019-09-01 ENCOUNTER — Ambulatory Visit: Payer: Medicare Other | Admitting: Gastroenterology

## 2019-09-02 ENCOUNTER — Ambulatory Visit: Payer: Medicare Other | Attending: Physical Medicine & Rehabilitation | Admitting: Physical Therapy

## 2019-09-02 ENCOUNTER — Encounter: Payer: Self-pay | Admitting: Physical Therapy

## 2019-09-02 ENCOUNTER — Other Ambulatory Visit: Payer: Self-pay

## 2019-09-02 DIAGNOSIS — R2689 Other abnormalities of gait and mobility: Secondary | ICD-10-CM | POA: Insufficient documentation

## 2019-09-02 DIAGNOSIS — S72002A Fracture of unspecified part of neck of left femur, initial encounter for closed fracture: Secondary | ICD-10-CM | POA: Diagnosis not present

## 2019-09-02 DIAGNOSIS — M6281 Muscle weakness (generalized): Secondary | ICD-10-CM | POA: Diagnosis not present

## 2019-09-02 DIAGNOSIS — R262 Difficulty in walking, not elsewhere classified: Secondary | ICD-10-CM

## 2019-09-02 NOTE — Therapy (Signed)
[place], [place], [address] Phone: [phone]   Fax:  [phone]  Physical Therapy Treatment  Patient Details  Name: Tamara Gregory MRN: [id number] Date of Birth: [birth year] Referring Provider (PT): Jamse Arn MD   Encounter Date: 09/02/2019  PT End of Session - 09/02/19 1115    Visit Number  11    Number of Visits  13    Date for PT Re-Evaluation  09/17/19    Authorization Type  U[employer]    PT Start Time  1016    PT Stop Time  [id number]    PT Time Calculation (min)  42 min       Past Medical History:  Diagnosis Date  . Anemia   . Anxiety   . Arthritis   . Cancer (HCC)    hx of skin cancer , colon   . Cataract    removed both eyes   . Chronic back pain    okay with sitting, increased back pain with activity   . Depression   . GERD (gastroesophageal reflux disease)   . Glaucoma   . Headache(784.0)    hx of  . History of kidney stones   . History of migraines   . Hyperlipidemia   . Hypertension   . Macular degeneration   . Myasthenia gravis ([place])   . Occasional tremors   . Pneumonia   . Spinal stenosis   . Thoracic aneurysm    4.4cm; see CT done 01/28/12 and 01/22/13 in [facility].   [name] Uses hearing aid   . Vitamin D deficiency     Past Surgical History:  Procedure Laterality Date  . ABDOMINAL HYSTERECTOMY    . CHOLECYSTECTOMY    . COLON SURGERY     Right colectomy dr. gross 03-17-18  . HIP ARTHROPLASTY Left 03/11/2019   Procedure: ARTHROPLASTY BIPOLAR HIP (HEMIARTHROPLASTY);  Surgeon: Renette Butters, MD;  Location: [facility];  Service: Orthopedics;  Laterality: Left;  . LUMBAR LAMINECTOMY/DECOMPRESSION MICRODISCECTOMY N/A 02/04/2013   Procedure: CENTRAL DECOMPRESSION/LUMBAR LAMINECTOMY L3-L4 AND L4-L5 2 LEVELS;  Surgeon: Tobi Bastos, MD;  Location: [facility];  Service: Orthopedics;  Laterality: N/A;  . SPINAL CORD STIMULATOR IMPLANT     in right hip but not currently using because it did  not help  . TONSILLECTOMY    . UPPER GASTROINTESTINAL ENDOSCOPY    . US ECHOCARDIOGRAPHY  05/18/2007   EF 55-60%    There were no vitals filed for this visit.                    [facility] Adult PT Treatment/Exercise - 09/02/19 0001      Ambulation/Gait   Gait Comments  Forward , retro, side and tandem gain in parallel bars with 1 UE support , cues for erect posture       Neuro Re-ed    Neuro Re-ed Details   alter, narrow on faom nating step taps needs 1 UE or light touch, rhomberg 1 minute eyes open without UE , tandem needs l1 UE light touch to maintain, SLS with 1 UE support-cues to lighten touch      Exercises   Other Exercises   frequent rest breaks with sit<>stand and transitioning bw motions      Knee/Hip Exercises: Aerobic   Nustep  5 min L3 LE only   247 steps      Knee/Hip Exercises: Standing   Heel Raises  10 reps;2 sets;Both  Heel Raises Limitations  on foam    Hip Flexion  Stengthening;Both;10 reps    Hip Flexion Limitations  UE support, on foam     Hip Abduction  Knee straight;10 reps;Both    Abduction Limitations  UE support , on foam     Forward Step Up Limitations  altenating step ups 5 times each in // bars with UE     Functional Squat  10 seconds    Functional Squat Limitations  in // bars                   PT Long Term Goals - 08/31/19 1445      PT LONG TERM GOAL #1   Title  Pt will be independent in her HEP and progression.    Baseline  reports doing her exercises in bed in the morning    Status  On-going      PT LONG TERM GOAL #2   Title  Pt will improve BERG balance score to > 30/56 for improved mobility (07/19/2019)    Baseline  15/56 today    Status  On-going      PT LONG TERM GOAL #3   Title  Pt will improve her L LE strength to >/= 4/5 in order to improve gait and functional mobility. (07/19/2019)    Baseline  see flowsheet    Status  On-going      PT LONG TERM GOAL #4   Title  Pt will be able to ambulate > 300'  with rollator walker modified independent for improved mobility (07/19/2019)    Baseline  CGA with multiple cues for posture & keeping RW close    Status  On-going            Plan - 09/02/19 1116    Clinical Impression Statement  Pt reports she did try splitting up exercises into groups as instructed. Her husband notes she is walking better. She demonstrates increased endurance  today with closed chain activity however is limited by SOB.    PT Next Visit Plan  work in // bars or with RW, 2 more visits in POC    PT Home Exercise Plan  breakfast: bridge & SLR; Lunch: seated march & LAQ; Dinner: counter hip abd & heel raises; stand every 30 min, walking: chest lifted & wide stance       Patient will benefit from skilled therapeutic intervention in order to improve the following deficits and impairments:  Abnormal gait, Difficulty walking, Cardiopulmonary status limiting activity, Pain, Decreased activity tolerance, Decreased strength, Decreased mobility  Visit Diagnosis: Muscle weakness (generalized)  Difficulty in walking, not elsewhere classified  Other abnormalities of gait and mobility  Left displaced femoral neck fracture ([place])     Problem List Patient Active Problem List   Diagnosis Date Noted  . Iron deficiency anemia due to chronic blood loss 08/29/2019  . CAD (coronary artery disease) 08/17/2019  . invasive SCC (squamous cell carcinoma), leg, left 07/31/2019  . Hypothyroidism 07/01/2019  . Closed fracture of left hip ([place]) 06/30/2019  . Hair loss 06/29/2019  . Acute on chronic systolic CHF (congestive heart failure) ([place]) 05/14/2019  . Sleep disturbance 04/29/2019  . Postoperative pain   . Tachycardia   . Steroid-induced hyperglycemia   . Labile blood pressure   . Leukocytosis   . E. coli UTI   . Hypoalbuminemia due to protein-calorie malnutrition ([place])   . Ocular myasthenia gravis ([place])   . Anemia   . Hip fracture (  [place]) 03/10/2019  . Left displaced femoral  neck fracture ([place]) 03/10/2019  . Weakness 12/04/2018  . Hypokalemia 03/18/2018  . 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 Right ovarian cyst s/p RSO 03/17/2018  . Diarrhea 12/23/2017  . Large hiatal hernia 04/01/2017  . Sweating profusely 03/19/2017  . Hyperglycemia 03/19/2017  . Squamous cell skin cancer 11/05/2016  . Nosebleed 10/19/2016  . Osteopenia 08/28/2016  . Essential hypertension, benign 08/21/2016  . Long term current use of systemic steroids 08/21/2016  . Primary open angle glaucoma of both eyes, mild stage 05/14/2016  . Ocular hypertension of left eye 05/08/2016  . GERD (gastroesophageal reflux disease) 05/06/2016  . Macular pucker, bilateral 11/14/2015  . Status post intraocular lens implant 04/04/2015  . Exudative age-related macular degeneration ([place]) 04/04/2015  . Chronic pain syndrome 02/07/2015  . Bilateral nonexudative age-related macular degeneration 01/16/2015  . Presence of intraocular lens 01/16/2015  . Insomnia 07/26/2014  . Urge incontinence of urine 01/17/2014  . Urinary urgency 01/17/2014  . Malaise and fatigue 10/04/2013  . PAC (premature atrial contraction) 10/04/2013  . Depression 02/15/2013  . Myasthenia gravis ([place]) 03/17/2012  . Spinal stenosis, lumbar 01/14/2012  . Ptosis of eyelid 01/14/2012  . Pure hypercholesterolemia 09/05/2011  . Benign hypertensive heart disease without heart failure 09/05/2011  . Osteoarthritis 09/05/2011    Dorene Ar, PTA 09/02/2019, 11:21 AM  [facility] [address] [place], [place], [address] Phone: [phone]   Fax:  [phone]  Name: Tamara Gregory MRN: [id number] Date of Birth: [birth year]

## 2019-09-07 ENCOUNTER — Ambulatory Visit: Payer: Medicare Other | Admitting: Physical Therapy

## 2019-09-07 ENCOUNTER — Other Ambulatory Visit: Payer: Self-pay

## 2019-09-07 ENCOUNTER — Encounter: Payer: Self-pay | Admitting: Physical Therapy

## 2019-09-07 DIAGNOSIS — R2689 Other abnormalities of gait and mobility: Secondary | ICD-10-CM | POA: Diagnosis not present

## 2019-09-07 DIAGNOSIS — M6281 Muscle weakness (generalized): Secondary | ICD-10-CM

## 2019-09-07 DIAGNOSIS — S72002A Fracture of unspecified part of neck of left femur, initial encounter for closed fracture: Secondary | ICD-10-CM | POA: Diagnosis not present

## 2019-09-07 DIAGNOSIS — R262 Difficulty in walking, not elsewhere classified: Secondary | ICD-10-CM

## 2019-09-07 NOTE — Patient Instructions (Signed)
Other lunch options, pick 2:

## 2019-09-07 NOTE — Therapy (Signed)
[place], [place], [address] Phone: [phone]   Fax:  [phone]  Physical Therapy Treatment  Patient Details  Name: Tamara Gregory MRN: [id number] Date of Birth: 1925/03/30 Referring Provider (PT): Jamse Arn MD   Encounter Date: 09/07/2019  PT End of Session - 09/07/19 1026    Visit Number  12    Number of Visits  13    Date for PT Re-Evaluation  09/17/19    Authorization Type  UHC    PT Start Time  1020    PT Stop Time  1058    PT Time Calculation (min)  38 min    Activity Tolerance  Patient tolerated treatment well    Behavior During Therapy  [facility] for tasks assessed/performed       Past Medical History:  Diagnosis Date  . Anemia   . Anxiety   . Arthritis   . Cancer (HCC)    hx of skin cancer , colon   . Cataract    removed both eyes   . Chronic back pain    okay with sitting, increased back pain with activity   . Depression   . GERD (gastroesophageal reflux disease)   . Glaucoma   . Headache(784.0)    hx of  . History of kidney stones   . History of migraines   . Hyperlipidemia   . Hypertension   . Macular degeneration   . Myasthenia gravis ([place])   . Occasional tremors   . Pneumonia   . Spinal stenosis   . Thoracic aneurysm    4.4cm; see CT done 01/28/12 and 01/22/13 in [facility].   [name] Uses hearing aid   . Vitamin D deficiency     Past Surgical History:  Procedure Laterality Date  . ABDOMINAL HYSTERECTOMY    . CHOLECYSTECTOMY    . COLON SURGERY     Right colectomy dr. gross 03-17-18  . HIP ARTHROPLASTY Left 03/11/2019   Procedure: ARTHROPLASTY BIPOLAR HIP (HEMIARTHROPLASTY);  Surgeon: Renette Butters, MD;  Location: [facility];  Service: Orthopedics;  Laterality: Left;  . LUMBAR LAMINECTOMY/DECOMPRESSION MICRODISCECTOMY N/A 02/04/2013   Procedure: CENTRAL DECOMPRESSION/LUMBAR LAMINECTOMY L3-L4 AND L4-L5 2 LEVELS;  Surgeon: Tobi Bastos, MD;  Location: [facility];  Service:  Orthopedics;  Laterality: N/A;  . SPINAL CORD STIMULATOR IMPLANT     in right hip but not currently using because it did not help  . TONSILLECTOMY    . UPPER GASTROINTESTINAL ENDOSCOPY    . US ECHOCARDIOGRAPHY  05/18/2007   EF 55-60%    There were no vitals filed for this visit.  Subjective Assessment - 09/07/19 1024    Subjective  I do my leg exercises before I get up. Having some diarrhea today but I think I am good to exercise. I am not walking as much as I should because I want somebody with me, but I think it is getting better. When I try to turn my legs are shakey.    Patient Stated Goals  Walk better    Currently in Pain?  No/denies                       [facility] Adult PT Treatment/Exercise - 09/07/19 0001      Knee/Hip Exercises: Aerobic   Nustep  7 min L3 LE only      Knee/Hip Exercises: Standing   Other Standing Knee Exercises  standing with head turns  Knee/Hip Exercises: Seated   Ball Squeeze  2s holds    Clamshell with TheraBand  Yellow    Other Seated Knee/Hip Exercises  march- pressing ball into top of thigh    Other Seated Knee/Hip Exercises  resisted plantar flexion green tband    Marching  10 reps;Both    Marching Limitations  yellow tband on knees    Hamstring Curl  10 reps;2 sets    Hamstring Limitations  yellow tband                  PT Long Term Goals - 08/31/19 1445      PT LONG TERM GOAL #1   Title  Pt will be independent in her HEP and progression.    Baseline  reports doing her exercises in bed in the morning    Status  On-going      PT LONG TERM GOAL #2   Title  Pt will improve BERG balance score to > 30/56 for improved mobility (07/19/2019)    Baseline  15/56 today    Status  On-going      PT LONG TERM GOAL #3   Title  Pt will improve her L LE strength to >/= 4/5 in order to improve gait and functional mobility. (07/19/2019)    Baseline  see flowsheet    Status  On-going      PT LONG TERM GOAL #4   Title  Pt  will be able to ambulate > 300' with rollator walker modified independent for improved mobility (07/19/2019)    Baseline  CGA with multiple cues for posture & keeping RW close    Status  On-going            Plan - 09/07/19 1101    Clinical Impression Statement  Exercises in seated today due to GI upset. Gave printout with other seated exercises for her to choose 2 different exercises to do each day at lunch time.    PT Treatment/Interventions  ADLs/Self Care Home Management;Moist Heat;Gait training;Functional mobility training;Therapeutic activities;Therapeutic exercise;Balance training;Patient/family education;Neuromuscular re-education;Manual techniques;Passive range of motion    PT Next Visit Plan  d/c    PT Home Exercise Plan  breakfast: bridge & SLR; Lunch: seated march & LAQ; Dinner: counter hip abd & heel raises; stand every 30 min, walking: chest lifted & wide stance    Consulted and Agree with Plan of Care  Patient       Patient will benefit from skilled therapeutic intervention in order to improve the following deficits and impairments:  Abnormal gait, Difficulty walking, Cardiopulmonary status limiting activity, Pain, Decreased activity tolerance, Decreased strength, Decreased mobility  Visit Diagnosis: Muscle weakness (generalized)  Difficulty in walking, not elsewhere classified  Other abnormalities of gait and mobility     Problem List Patient Active Problem List   Diagnosis Date Noted  . Iron deficiency anemia due to chronic blood loss 08/29/2019  . CAD (coronary artery disease) 08/17/2019  . invasive SCC (squamous cell carcinoma), leg, left 07/31/2019  . Hypothyroidism 07/01/2019  . Closed fracture of left hip ([place]) 06/30/2019  . Hair loss 06/29/2019  . Acute on chronic systolic CHF (congestive heart failure) ([place]) 05/14/2019  . Sleep disturbance 04/29/2019  . Postoperative pain   . Tachycardia   . Steroid-induced hyperglycemia   . Labile blood pressure    . Leukocytosis   . E. coli UTI   . Hypoalbuminemia due to protein-calorie malnutrition ([place])   . Ocular myasthenia gravis ([place])   .  Anemia   . Hip fracture ([place]) 03/10/2019  . Left displaced femoral neck fracture ([place]) 03/10/2019  . Weakness 12/04/2018  . Hypokalemia 03/18/2018  . 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 Right ovarian cyst s/p RSO 03/17/2018  . Diarrhea 12/23/2017  . Large hiatal hernia 04/01/2017  . Sweating profusely 03/19/2017  . Hyperglycemia 03/19/2017  . Squamous cell skin cancer 11/05/2016  . Nosebleed 10/19/2016  . Osteopenia 08/28/2016  . Essential hypertension, benign 08/21/2016  . Long term current use of systemic steroids 08/21/2016  . Primary open angle glaucoma of both eyes, mild stage 05/14/2016  . Ocular hypertension of left eye 05/08/2016  . GERD (gastroesophageal reflux disease) 05/06/2016  . Macular pucker, bilateral 11/14/2015  . Status post intraocular lens implant 04/04/2015  . Exudative age-related macular degeneration ([place]) 04/04/2015  . Chronic pain syndrome 02/07/2015  . Bilateral nonexudative age-related macular degeneration 01/16/2015  . Presence of intraocular lens 01/16/2015  . Insomnia 07/26/2014  . Urge incontinence of urine 01/17/2014  . Urinary urgency 01/17/2014  . Malaise and fatigue 10/04/2013  . PAC (premature atrial contraction) 10/04/2013  . Depression 02/15/2013  . Myasthenia gravis ([place]) 03/17/2012  . Spinal stenosis, lumbar 01/14/2012  . Ptosis of eyelid 01/14/2012  . Pure hypercholesterolemia 09/05/2011  . Benign hypertensive heart disease without heart failure 09/05/2011  . Osteoarthritis 09/05/2011    Chaka Jefferys C. Preslyn Warr PT, DPT 09/07/19 11:03 AM   [facility] [address] [place], [place], [address] Phone: [phone]   Fax:  [phone]  Name: Tamara Gregory MRN: [id number] Date of Birth: 08-11-1925

## 2019-09-09 ENCOUNTER — Other Ambulatory Visit: Payer: Self-pay

## 2019-09-09 ENCOUNTER — Ambulatory Visit: Payer: Medicare Other | Admitting: Physical Therapy

## 2019-09-09 ENCOUNTER — Encounter: Payer: Self-pay | Admitting: Physical Therapy

## 2019-09-09 DIAGNOSIS — M6281 Muscle weakness (generalized): Secondary | ICD-10-CM | POA: Diagnosis not present

## 2019-09-09 DIAGNOSIS — S72002A Fracture of unspecified part of neck of left femur, initial encounter for closed fracture: Secondary | ICD-10-CM | POA: Diagnosis not present

## 2019-09-09 DIAGNOSIS — R2689 Other abnormalities of gait and mobility: Secondary | ICD-10-CM

## 2019-09-09 DIAGNOSIS — R262 Difficulty in walking, not elsewhere classified: Secondary | ICD-10-CM

## 2019-09-09 NOTE — Therapy (Signed)
[place], [place], [address] Phone: [phone]   Fax:  [phone]  Physical Therapy Treatment/Discharge  Patient Details  Name: Tamara Gregory MRN: [id number] Date of Birth: 02-Sep-1925 Referring Provider (PT): Jamse Arn MD   Encounter Date: 09/09/2019  PT End of Session - 09/09/19 1050    Visit Number  13    Number of Visits  13    Date for PT Re-Evaluation  09/17/19    Authorization Type  [employer]    PT Start Time  1016    PT Stop Time  1045    PT Time Calculation (min)  29 min    Activity Tolerance  Patient tolerated treatment well    Behavior During Therapy  [facility] for tasks assessed/performed       Past Medical History:  Diagnosis Date  . Anemia   . Anxiety   . Arthritis   . Cancer (HCC)    hx of skin cancer , colon   . Cataract    removed both eyes   . Chronic back pain    okay with sitting, increased back pain with activity   . Depression   . GERD (gastroesophageal reflux disease)   . Glaucoma   . Headache(784.0)    hx of  . History of kidney stones   . History of migraines   . Hyperlipidemia   . Hypertension   . Macular degeneration   . Myasthenia gravis ([place])   . Occasional tremors   . Pneumonia   . Spinal stenosis   . Thoracic aneurysm    4.4cm; see CT done 01/28/12 and 01/22/13 in [facility].   [name] Uses hearing aid   . Vitamin D deficiency     Past Surgical History:  Procedure Laterality Date  . ABDOMINAL HYSTERECTOMY    . CHOLECYSTECTOMY    . COLON SURGERY     Right colectomy dr. gross 03-17-18  . HIP ARTHROPLASTY Left 03/11/2019   Procedure: ARTHROPLASTY BIPOLAR HIP (HEMIARTHROPLASTY);  Surgeon: Renette Butters, MD;  Location: [place];  Service: Orthopedics;  Laterality: Left;  . LUMBAR LAMINECTOMY/DECOMPRESSION MICRODISCECTOMY N/A 02/04/2013   Procedure: CENTRAL DECOMPRESSION/LUMBAR LAMINECTOMY L3-L4 AND L4-L5 2 LEVELS;  Surgeon: Tobi Bastos, MD;  Location: [facility];   Service: Orthopedics;  Laterality: N/A;  . SPINAL CORD STIMULATOR IMPLANT     in right hip but not currently using because it did not help  . TONSILLECTOMY    . UPPER GASTROINTESTINAL ENDOSCOPY    . US ECHOCARDIOGRAPHY  05/18/2007   EF 55-60%    There were no vitals filed for this visit.  Subjective Assessment - 09/09/19 1021    Subjective  I am feeling pretty good. I am trying to do my exercises, I am doing some but sometimes I miss a couple.    Currently in Pain?  No/denies         [facility] PT Assessment - 09/09/19 0001      Assessment   Medical Diagnosis  L hip fx, difficulty walking    Referring Provider (PT)  Jamse Arn MD    Onset Date/Surgical Date  03/05/19    Hand Dominance  Right      Prior Function   Level of Independence  Needs assistance with ADLs      Cognition   Overall Cognitive Status  Within Functional Limits for tasks assessed      Observation/Other Assessments   Focus on Therapeutic Outcomes (FOTO)  NP      Posture/Postural Control   Posture Comments  able to demo upright posture but stays rounded and reaches for objects      Strength   Overall Strength Comments  bil knee Flx/ext & ankle DF 5/5    Right Hip Flexion  4+/5    Right Hip External Rotation   4+/5    Left Hip Flexion  4+/5    Left Hip External Rotation  4+/5      Berg Balance Test   Sit to Stand  Able to stand  independently using hands    Standing Unsupported  Able to stand 2 minutes with supervision    Sitting with Back Unsupported but Feet Supported on Floor or Stool  Able to sit safely and securely 2 minutes    Stand to Sit  Controls descent by using hands    Transfers  Able to transfer with verbal cueing and /or supervision    Standing Unsupported with Eyes Closed  Able to stand 3 seconds    Standing Unsupported with Feet Together  Needs help to attain position but able to stand for 30 seconds with feet together    From Standing, Reach Forward with Outstretched Arm  Loses  balance while trying/requires external support    From Standing Position, Pick up Object from Floor  Unable to try/needs assist to keep balance    From Standing Position, Turn to Look Behind Over each Shoulder  Looks behind one side only/other side shows less weight shift    Turn 360 Degrees  Needs close supervision or verbal cueing    Standing Unsupported, Alternately Place Feet on Step/Stool  Needs assistance to keep from falling or unable to try    Standing Unsupported, One Foot in [employer] balance while stepping or standing    Standing on One Leg  Unable to try or needs assist to prevent fall    Total Score  22                   [facility] Adult PT Treatment/Exercise - 09/09/19 0001      Knee/Hip Exercises: Aerobic   Nustep  7 min L3 UE & LE             PT Education - 09/09/19 1054    Education Details  importanct of regular mobility and standing, daily exercise, goals & measurements    Person(s) Educated  Patient    Methods  Explanation    Comprehension  Verbalized understanding          PT Long Term Goals - 09/09/19 1031      PT LONG TERM GOAL #1   Title  Pt will be independent in her HEP and progression.    Status  Achieved      PT LONG TERM GOAL #2   Title  Pt will improve BERG balance score to > 30/56 for improved mobility (07/19/2019)    Baseline  22/56, improved from 15/56    Status  Not Met      PT LONG TERM GOAL #3   Title  Pt will improve her L LE strength to >/= 4/5 in order to improve gait and functional mobility. (07/19/2019)    Baseline  see flowsheet    Status  Achieved      PT LONG TERM GOAL #4   Title  Pt will be able to ambulate > 300' with rollator walker modified independent for improved mobility (07/19/2019)  Baseline  able with CGA and cues for posture    Status  Partially Met            Plan - 09/09/19 1050    Clinical Impression Statement  Session had to be ended early today due to urinary incontinence accident. Pt  has made improvements in strength and mild improvement in balance control. Requires cues to stand and take steps rather than lean and reach for objects. Pt has made progress but is appropriate to d/c to independent strengthening with HEP. We discussed practicing standing and doing it more often withuse of RW. Pt admits that she sits still a lot of the day and needs to be better about moving around. Encouraged her to contact us with any further questions and to obtain a new referral should she feel that she needs PT again in the future.    PT Treatment/Interventions  ADLs/Self Care Home Management;Moist Heat;Gait training;Functional mobility training;Therapeutic activities;Therapeutic exercise;Balance training;Patient/family education;Neuromuscular re-education;Manual techniques;Passive range of motion    PT Home Exercise Plan  breakfast: bridge & SLR; Lunch: seated march & LAQ; Dinner: counter hip abd & heel raises; stand every 30 min, walking: chest lifted & wide stance    Consulted and Agree with Plan of Care  Patient       Patient will benefit from skilled therapeutic intervention in order to improve the following deficits and impairments:  Abnormal gait, Difficulty walking, Cardiopulmonary status limiting activity, Pain, Decreased activity tolerance, Decreased strength, Decreased mobility  Visit Diagnosis: Muscle weakness (generalized)  Difficulty in walking, not elsewhere classified  Other abnormalities of gait and mobility  Left displaced femoral neck fracture ([place])     Problem List Patient Active Problem List   Diagnosis Date Noted  . Iron deficiency anemia due to chronic blood loss 08/29/2019  . CAD (coronary artery disease) 08/17/2019  . invasive SCC (squamous cell carcinoma), leg, left 07/31/2019  . Hypothyroidism 07/01/2019  . Closed fracture of left hip ([place]) 06/30/2019  . Hair loss 06/29/2019  . Acute on chronic systolic CHF (congestive heart failure) ([place]) 05/14/2019  .  Sleep disturbance 04/29/2019  . Postoperative pain   . Tachycardia   . Steroid-induced hyperglycemia   . Labile blood pressure   . Leukocytosis   . E. coli UTI   . Hypoalbuminemia due to protein-calorie malnutrition ([place])   . Ocular myasthenia gravis ([place])   . Anemia   . Hip fracture ([place]) 03/10/2019  . Left displaced femoral neck fracture ([place]) 03/10/2019  . Weakness 12/04/2018  . Hypokalemia 03/18/2018  . 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 Right ovarian cyst s/p RSO 03/17/2018  . Diarrhea 12/23/2017  . Large hiatal hernia 04/01/2017  . Sweating profusely 03/19/2017  . Hyperglycemia 03/19/2017  . Squamous cell skin cancer 11/05/2016  . Nosebleed 10/19/2016  . Osteopenia 08/28/2016  . Essential hypertension, benign 08/21/2016  . Long term current use of systemic steroids 08/21/2016  . Primary open angle glaucoma of both eyes, mild stage 05/14/2016  . Ocular hypertension of left eye 05/08/2016  . GERD (gastroesophageal reflux disease) 05/06/2016  . Macular pucker, bilateral 11/14/2015  . Status post intraocular lens implant 04/04/2015  . Exudative age-related macular degeneration ([place]) 04/04/2015  . Chronic pain syndrome 02/07/2015  . Bilateral nonexudative age-related macular degeneration 01/16/2015  . Presence of intraocular lens 01/16/2015  . Insomnia 07/26/2014  . Urge incontinence of urine 01/17/2014  . Urinary urgency 01/17/2014  . Malaise and fatigue 10/04/2013  . PAC (premature  atrial contraction) 10/04/2013  . Depression 02/15/2013  . Myasthenia gravis ([place]) 03/17/2012  . Spinal stenosis, lumbar 01/14/2012  . Ptosis of eyelid 01/14/2012  . Pure hypercholesterolemia 09/05/2011  . Benign hypertensive heart disease without heart failure 09/05/2011  . Osteoarthritis 09/05/2011    PHYSICAL THERAPY DISCHARGE SUMMARY  Visits from Start of Care: 13  Current functional level related to goals / functional outcomes: See above    Remaining deficits: See above   Education / Equipment: Anatomy of condition, POC, HEP, exercise form/rationale  Plan: Patient agrees to discharge.  Patient goals were partially met. Patient is being discharged due to being pleased with the current functional level.  ?????     Kahmya Pinkham C. Cheikh Bramble PT, DPT 09/09/19 10:56 AM   [facility] [address] [place], [place], [address] Phone: [phone]   Fax:  [phone]  Name: AASIA PEAVLER MRN: [id number] Date of Birth: 07-13-1925

## 2019-09-22 ENCOUNTER — Other Ambulatory Visit: Payer: Self-pay

## 2019-09-22 ENCOUNTER — Telehealth (INDEPENDENT_AMBULATORY_CARE_PROVIDER_SITE_OTHER): Payer: Medicare Other | Admitting: Neurology

## 2019-09-22 ENCOUNTER — Encounter: Payer: Self-pay | Admitting: Neurology

## 2019-09-22 VITALS — Ht 63.0 in | Wt 142.0 lb

## 2019-09-22 DIAGNOSIS — G7000 Myasthenia gravis without (acute) exacerbation: Secondary | ICD-10-CM

## 2019-09-22 NOTE — Progress Notes (Signed)
    Virtual Visit via Telephone Note The purpose of this virtual visit is to provide medical care while limiting exposure to the novel coronavirus.    Consent was obtained for phone visit:  Yes.   Answered questions that patient had about telehealth interaction:  Yes.   I discussed the limitations, risks, security and privacy concerns of performing an evaluation and management service by telephone. I also discussed with the patient that there may be a patient responsible charge related to this service. The patient expressed understanding and agreed to proceed.  Pt location: Home Physician Location: office Name of referring provider:  Binnie Rail, MD I connected with .Tamara Gregory at patients initiation/request on 09/22/2019 at  9:50 AM EDT by telephone and verified that I am speaking with the correct person using two identifiers.  Pt MRN:  [id number] Pt DOB:  May 20, 1925   History of Present Illness: This is a 85 year-old female with ocular myasthenia gravis.  At her last visit, prednisone was increased to 15mg  daily however there has been no change in her diplopia or ptosis.  She continues to wear a eye patch.  There is no difficulty swallowing, talking, or arm/leg weakness (more than usual).  She is debilitated from her hip fracture and uses a walker at all times.     Assessment and Plan:   Seropositive ocular myasthenia gravis with exacerbation, diagnosed 2013 at [facility] by Dr. Vallarie Mare.  She was doing well on prednisone 5mg  until August 2020, when she diplopia and right ptosis became worse.   There has been no improvement on prednisone 15mg , so I will increase it further to 20mg  daily She has been as high has prednisone 25mg  in the past Continue Mestinon 60 mg twice daily at 8 AM and 3 PM.  Long-term corticosteroid use.   Continue Pepcid and calcium with vitamin D supplements.     Follow Up Instructions:   I discussed the assessment and treatment plan with the patient. The  patient was provided an opportunity to ask questions and all were answered. The patient agreed with the plan and demonstrated an understanding of the instructions.   The patient was advised to call back or seek an in-person evaluation if the symptoms worsen or if the condition fails to improve as anticipated.  Telephone follow-up in 3 weeks  Total Time spent in visit with the patient was:  15 min, of which 100% of the time was spent in counseling and/or coordinating care.   Pt understands and agrees with the plan of care outlined.     Alda Berthold, DO

## 2019-10-06 ENCOUNTER — Encounter: Payer: Self-pay | Admitting: Neurology

## 2019-10-06 ENCOUNTER — Other Ambulatory Visit: Payer: Self-pay

## 2019-10-06 ENCOUNTER — Telehealth (INDEPENDENT_AMBULATORY_CARE_PROVIDER_SITE_OTHER): Payer: Medicare Other | Admitting: Neurology

## 2019-10-06 ENCOUNTER — Telehealth: Payer: Self-pay | Admitting: Neurology

## 2019-10-06 VITALS — Ht 61.0 in | Wt 145.0 lb

## 2019-10-06 DIAGNOSIS — G7000 Myasthenia gravis without (acute) exacerbation: Secondary | ICD-10-CM | POA: Diagnosis not present

## 2019-10-06 NOTE — Progress Notes (Signed)
    Virtual Visit via Telephone Note The purpose of this virtual visit is to provide medical care while limiting exposure to the novel coronavirus.    Consent was obtained for phone visit:  Yes.   Answered questions that patient had about telehealth interaction:  Yes.   I discussed the limitations, risks, security and privacy concerns of performing an evaluation and management service by telephone. I also discussed with the patient that there may be a patient responsible charge related to this service. The patient expressed understanding and agreed to proceed.  Pt location: Home Physician Location: office Name of referring provider:  Binnie Rail, MD I connected with .Tamara Gregory at patients initiation/request on 10/06/2019 at 10:50 AM EST by telephone and verified that I am speaking with the correct person using two identifiers.  Pt MRN:  [id number] Pt DOB:  1925-06-16   History of Present Illness: This is a 85 year-old female with ocular myasthenia gravis.  Despite increasing prednisone to 20 mg daily, there has been no improvement in her double vision.  She does not have any ptosis, which is improved.  She is wearing an eye patch which helps.  She denies any problems with swallowing, talking, or limb weakness.  Husband states that she has good days and bad days related to her overall declining health.  She has difficulty walking from have hip fracture and uses a walker at all times.    Assessment and Plan:   Seropositive ocular myasthenia gravis with exacerbation, diagnosed 2013 at [facility] by Dr. Vallarie Mare.  She was doing well on prednisone 5mg  until August 2020, when she diplopia and right ptosis became worse.   Her prednisone has gradually been increased and she is been on prednisone 20 mg for the past 2 weeks.  Unfortunately, she has not experienced any improvement with her diplopia.  I am very reluctant to increase her prednisone further given her other medical comorbidities and  adverse corticosteroid side effects in the setting of recent hip fracture.  I discussed the importance of balancing symptoms with medication side effects and ultimately have decided to keep her on prednisone 20 mg and Mestinon 60 mg twice daily at 8 AM and 3 PM.  I have encouraged her to continue to use a eye patch as needed.  Long-term corticosteroid use.   Continue Pepcid and calcium with vitamin D supplements.     Follow Up Instructions:   I discussed the assessment and treatment plan with the patient. The patient was provided an opportunity to ask questions and all were answered. The patient agreed with the plan and demonstrated an understanding of the instructions.   The patient was advised to call back or seek an in-person evaluation if the symptoms worsen or if the condition fails to improve as anticipated.  Telephone follow-up in 1 month  Total Time spent in visit with the patient was:  12 min, of which 100% of the time was spent in counseling and/or coordinating care.   Pt understands and agrees with the plan of care outlined.     Alda Berthold, DO

## 2019-10-06 NOTE — Telephone Encounter (Signed)
Called and left patient a message to call back and schedule with Dr. Posey Pronto re:   Please schedule telephone visit in 1 month (Wednesday morning). Thanks.

## 2019-10-07 NOTE — Telephone Encounter (Signed)
11/03/19 at 10:50 AM.

## 2019-10-19 DIAGNOSIS — R3915 Urgency of urination: Secondary | ICD-10-CM | POA: Diagnosis not present

## 2019-10-19 DIAGNOSIS — Z7409 Other reduced mobility: Secondary | ICD-10-CM | POA: Diagnosis not present

## 2019-10-19 DIAGNOSIS — R82998 Other abnormal findings in urine: Secondary | ICD-10-CM | POA: Diagnosis not present

## 2019-10-19 DIAGNOSIS — R54 Age-related physical debility: Secondary | ICD-10-CM | POA: Diagnosis not present

## 2019-10-21 ENCOUNTER — Encounter: Payer: Medicare Other | Admitting: Physical Medicine & Rehabilitation

## 2019-11-01 ENCOUNTER — Other Ambulatory Visit: Payer: Self-pay | Admitting: Internal Medicine

## 2019-11-02 ENCOUNTER — Encounter: Payer: Self-pay | Admitting: Neurology

## 2019-11-03 ENCOUNTER — Other Ambulatory Visit: Payer: Self-pay

## 2019-11-03 ENCOUNTER — Telehealth: Payer: Self-pay | Admitting: Internal Medicine

## 2019-11-03 ENCOUNTER — Telehealth (INDEPENDENT_AMBULATORY_CARE_PROVIDER_SITE_OTHER): Payer: Medicare Other | Admitting: Neurology

## 2019-11-03 VITALS — Ht 62.0 in | Wt 145.0 lb

## 2019-11-03 DIAGNOSIS — G7000 Myasthenia gravis without (acute) exacerbation: Secondary | ICD-10-CM | POA: Diagnosis not present

## 2019-11-03 NOTE — Telephone Encounter (Signed)
Copied from [place] [phone]. Topic: General - Other >> Nov 03, 2019 10:50 AM Celene Kras wrote: Reason for CRM: Pts husband called and is requesting to know if pt can start getting injections that will help strengthen her bones. Please advise.

## 2019-11-03 NOTE — Telephone Encounter (Signed)
Please advise if okay to order or how you would like to proceed?

## 2019-11-03 NOTE — Progress Notes (Signed)
    Virtual Visit via Telephone Note The purpose of this virtual visit is to provide medical care while limiting exposure to the novel coronavirus.    Consent was obtained for phone visit:  Yes.   Answered questions that patient had about telehealth interaction:  Yes.   I discussed the limitations, risks, security and privacy concerns of performing an evaluation and management service by telephone. I also discussed with the patient that there may be a patient responsible charge related to this service. The patient expressed understanding and agreed to proceed.  Pt location: Home Physician Location: office Name of referring provider:  Binnie Rail, MD I connected with .Tamara Gregory at patients initiation/request on 11/03/2019 at 10:50 AM EST by telephone and verified that I am speaking with the correct person using two identifiers.  Pt MRN:  [id number] Pt DOB:  [birth year]   History of Present Illness: This is a 85 year old female with ocular myasthenia gravis.  She has been on prednisone 20 mg for several months without any change in the degree of her double vision.  Patient's husband reports that it is significantly affecting her quality of life.  She does try to use an eye patch.  She also has droopiness of the eyelid.  There is no difficulty with swallowing, talking, or limb weakness.  She suffered a fall last week with no significant injuries.  She does have history of osteoporosis and hip fracture earlier this year.      Assessment and Plan:   Seropositive ocular myasthenia gravis with exacerbation, diagnosed 2013 at [facility] by Dr. Vallarie Mare.  She was doing well on prednisone 5mg  until August 2020, when she diplopia and right ptosis became worse.  Since August, I have very slowly titrated her prednisone and she has been on 20 mg for the past 6 weeks without any improvement.  I have been very reluctant to further titrate this given her recent fracture, osteoporosis, advanced age, and high  risk for falls.  After much discussion, it was decided to offer a short trial of prednisone 30 mg daily for 2 weeks and reassess.  If there is no improvement, I will slowly taper the dose.  Continue Mestinon 60 mg twice daily at 8 AM and 3 PM.  I have encouraged her to continue to use a eye patch as needed.  Long-term corticosteroid use.   Continue Pepcid and calcium with vitamin D supplements.    Follow-up with PCP regarding osteoporosis management  Follow Up Instructions:   I discussed the assessment and treatment plan with the patient. The patient was provided an opportunity to ask questions and all were answered. The patient agreed with the plan and demonstrated an understanding of the instructions.   The patient was advised to call back or seek an in-person evaluation if the symptoms worsen or if the condition fails to improve as anticipated.  Telephone follow-up in 2 weeks  Total Time spent in visit with the patient was:  8 min, of which 100% of the time was spent in counseling and/or coordinating care.   Pt understands and agrees with the plan of care outlined.     Tamara Berthold, DO

## 2019-11-04 NOTE — Telephone Encounter (Signed)
Patient's insurance will be verified, I will call patient to discuss summary of benefits and answer questions about prolia after insurance summary is received

## 2019-11-04 NOTE — Telephone Encounter (Signed)
Tammy can you please look into Prolia for her.  Please let her/her husband know cost.

## 2019-11-05 ENCOUNTER — Other Ambulatory Visit: Payer: Self-pay | Admitting: Cardiovascular Disease

## 2019-11-05 MED ORDER — POTASSIUM CHLORIDE CRYS ER 20 MEQ PO TBCR: EXTENDED_RELEASE_TABLET | ORAL | 8 refills | Status: DC

## 2019-11-08 DIAGNOSIS — H401131 Primary open-angle glaucoma, bilateral, mild stage: Secondary | ICD-10-CM | POA: Diagnosis not present

## 2019-11-09 DIAGNOSIS — R3129 Other microscopic hematuria: Secondary | ICD-10-CM | POA: Diagnosis not present

## 2019-11-09 DIAGNOSIS — R82998 Other abnormal findings in urine: Secondary | ICD-10-CM | POA: Diagnosis not present

## 2019-11-09 DIAGNOSIS — R3915 Urgency of urination: Secondary | ICD-10-CM | POA: Diagnosis not present

## 2019-11-15 ENCOUNTER — Other Ambulatory Visit: Payer: Self-pay

## 2019-11-15 ENCOUNTER — Encounter: Payer: Self-pay | Admitting: Physical Medicine & Rehabilitation

## 2019-11-15 ENCOUNTER — Encounter: Payer: Medicare Other | Attending: Physical Medicine & Rehabilitation | Admitting: Physical Medicine & Rehabilitation

## 2019-11-15 VITALS — BP 124/71 | HR 77 | Temp 98.0°F | Ht 60.0 in | Wt 145.0 lb

## 2019-11-15 DIAGNOSIS — G7000 Myasthenia gravis without (acute) exacerbation: Secondary | ICD-10-CM

## 2019-11-15 DIAGNOSIS — Z7409 Other reduced mobility: Secondary | ICD-10-CM | POA: Diagnosis not present

## 2019-11-15 DIAGNOSIS — R269 Unspecified abnormalities of gait and mobility: Secondary | ICD-10-CM | POA: Diagnosis not present

## 2019-11-15 NOTE — Progress Notes (Signed)
Subjective:    Patient ID: Tamara Gregory, female    DOB: 14-Aug-1925, 85 y.o.   MRN: [id number]   HPI Right-handed female with history of colon cancer with resection, chronic back pain followed by Dr. Maryjean Ka with spinal cord stimulator, hypertension, hyperlipidemia, ocular myasthenia gravis presents for follow-up after for left hip fracture status post hemiarthroplasty on 03/11/2019.  Husband supplements history. Last clinic visit 08/18/2019.  Husband supplements history. Since that time, she completed therapies. She is doing HEP. Endurance has not improved. Husband notes improvement in strength. Appetite is improving. She saw Neurology at [facility], and steroids were increased.  Sleep has improved. She had a fall when her walker got stuck.  Pain Inventory Average Pain 0 Pain Right Now 0 My pain is na  In the last 24 hours, has pain interfered with the following? General activity 1 Relation with others 1 Enjoyment of life 1 What TIME of day is your pain at its worst? na Sleep (in general) Fair  Pain is worse with: na Pain improves with: na Relief from Meds: na  Mobility use a wheelchair needs help with transfers  Function retired I need assistance with the following:  feeding, dressing, bathing, toileting, meal prep, household duties and shopping  Neuro/Psych bladder control problems bowel control problems weakness numbness tingling trouble walking  Prior Studies Any changes since last visit?  yes bone scan CT/MRI  Physicians involved in your care Primary care Dr. Celso Amy   Family History  Problem Relation Age of Onset  . Heart attack Father   . Diabetes Father   . Cancer Sister   . Breast cancer Sister   . Cancer Brother   . Lung cancer Brother   . Cancer Sister   . Liver cancer Sister   . Diabetes Sister   . Healthy Son   . Colon polyps Neg Hx   . Colon cancer Neg Hx   . Esophageal cancer Neg Hx   . Rectal cancer Neg Hx   . Stomach cancer Neg Hx   .  Adrenal disorder Neg Hx    Social History   Socioeconomic History  . Marital status: Married    Spouse name: Not on file  . Number of children: 3  . Years of education: Not on file  . Highest education level: High school graduate  Occupational History  . Occupation: retired  Tobacco Use  . Smoking status: Never Smoker  . Smokeless tobacco: Never Used  Substance and Sexual Activity  . Alcohol use: No  . Drug use: No  . Sexual activity: Not Currently  Other Topics Concern  . Not on file  Social History Narrative   Lives with husband in a one story home.  Has 2 sons.  Retired.  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  Education: college.  [employer].       Right handed    Social Determinants of Health   Financial Resource Strain: Low Risk   . Difficulty of Paying Living Expenses: Not hard at all  Food Insecurity: No Food Insecurity  . Worried About [name] fundraiser in the Last Year: Never true  . Ran Out of Food in the Last Year: Never true  Transportation Needs: No Transportation Needs  . Lack of Transportation (Medical): No  . Lack of Transportation (Non-Medical): No  Physical Activity: Inactive  . Days of Exercise per Week: 0 days  . Minutes of Exercise per Session: 0 min  Stress:  Stress Concern Present  . Feeling of Stress : Rather much  Social Connections: Not Isolated  . Frequency of Communication with Friends and Family: More than three times a week  . Frequency of Social Gatherings with Friends and Family: Once a week  . Attends Religious Services: 1 to 4 times per year  . Active Member of Clubs or Organizations: Yes  . Attends Archivist Meetings: More than 4 times per year  . Marital Status: Married   Past Surgical History:  Procedure Laterality Date  . ABDOMINAL HYSTERECTOMY    . CHOLECYSTECTOMY    . COLON SURGERY     Right colectomy dr. gross 03-17-18  . HIP ARTHROPLASTY Left 03/11/2019   Procedure: ARTHROPLASTY BIPOLAR  HIP (HEMIARTHROPLASTY);  Surgeon: Renette Butters, MD;  Location: [place];  Service: Orthopedics;  Laterality: Left;  . LUMBAR LAMINECTOMY/DECOMPRESSION MICRODISCECTOMY N/A 02/04/2013   Procedure: CENTRAL DECOMPRESSION/LUMBAR LAMINECTOMY L3-L4 AND L4-L5 2 LEVELS;  Surgeon: Tobi Bastos, MD;  Location: [facility];  Service: Orthopedics;  Laterality: N/A;  . SPINAL CORD STIMULATOR IMPLANT     in right hip but not currently using because it did not help  . TONSILLECTOMY    . UPPER GASTROINTESTINAL ENDOSCOPY    . US ECHOCARDIOGRAPHY  05/18/2007   EF 55-60%   Past Medical History:  Diagnosis Date  . Anemia   . Anxiety   . Arthritis   . Cancer (HCC)    hx of skin cancer , colon   . Cataract    removed both eyes   . Chronic back pain    okay with sitting, increased back pain with activity   . Depression   . GERD (gastroesophageal reflux disease)   . Glaucoma   . Headache(784.0)    hx of  . History of kidney stones   . History of migraines   . Hyperlipidemia   . Hypertension   . Macular degeneration   . Myasthenia gravis ([place])   . Occasional tremors   . Pneumonia   . Spinal stenosis   . Thoracic aneurysm    4.4cm; see CT done 01/28/12 and 01/22/13 in [facility].   [name] Uses hearing aid   . Vitamin D deficiency    BP 124/71   Pulse 77   Temp 98 F (36.7 C)   Ht 5' (1.524 m)   Wt 145 lb (65.8 kg)   SpO2 93%   BMI 28.32 kg/m   Opioid Risk Score:   Fall Risk Score:  `1  Depression screen PHQ 2/9  Depression screen [facility] 2/9 01/27/2019 11/19/2017 03/19/2017 05/06/2016  Decreased Interest 3 3 3  0  Down, Depressed, Hopeless 3 2 2  0  PHQ - 2 Score 6 5 5  0  Altered sleeping 3 2 1  -  Tired, decreased energy 3 3 3  -  Change in appetite 1 1 1  -  Feeling bad or failure about yourself  2 1 0 -  Trouble concentrating 1 0 0 -  Moving slowly or fidgety/restless 0 0 0 -  Suicidal thoughts 0 0 0 -  PHQ-9 Score 16 12 10  -  Difficult doing work/chores Somewhat difficult Somewhat difficult - -    Some recent data might be hidden    Review of Systems  Constitutional: Negative.   HENT: Negative.   Eyes: Negative.   Respiratory: Positive for shortness of breath.   Cardiovascular: Negative.   Gastrointestinal: Negative.   Endocrine: Negative.   Genitourinary: Positive for difficulty urinating.  Musculoskeletal: Positive  for gait problem.  Skin: Negative.   Allergic/Immunologic: Negative.   Neurological: Positive for tremors, weakness and numbness.  Hematological: Negative.   Psychiatric/Behavioral: Positive for dysphoric mood. The patient is nervous/anxious.       Objective:   Physical Exam  Constitutional: No distress . Vital signs reviewed. HENT: Normocephalic.  Atraumatic. Eyes: EOMI. No discharge. Cardiovascular: No JVD. Respiratory: Normal effort.  No stridor. GI: Non-distended. Skin: Warm and dry.  Intact. Psych: Normal mood.  Normal behavior. Musc: No edema in extremities.  No tenderness in extremities. Neurological: Alert HOH Makes good eye contact with examiner.  Motor:  RLE 4+/5 prox to distal  LLE 4+/5 HF, KE, ADF/PF  Skin: Vascular changes distal left lower extremity. Psychiatric: Normal mood.  Normal affect.    Assessment & Plan:  Right-handed female with history of colon cancer with resection, chronic back pain followed by Dr. Maryjean Ka with spinal cord stimulator, hypertension, hyperlipidemia, ocular myasthenia gravis presents for follow up for left hip fracture status post hemiarthroplasty on 03/11/2019.  1. Decreased functional mobility secondary to left displaced femoral neck fracture .Status post bipolar hip hemiarthroplasty on 03/11/2019.  Endurance main limiting factor - multifactorial with age, MG, steroids, hypothyroidism  Cont HEP  Endurance most limiting factor at present, multifactorial - MG, steroids, near diagnosis CHF, poor appetite, sleep disturbance, and age related changes, improving  Will consider therapies again  2. Poor appetite -  improved  3. History of ocular myasthenia gravis.   Cont chronic prednisone   Follow up with Neurology at [facility]  4. Poor sleep  Improved  6. Gait abnormality  Cont HEP  Cont walker/wheelchair for safety

## 2019-11-16 ENCOUNTER — Encounter: Payer: Self-pay | Admitting: Neurology

## 2019-11-17 ENCOUNTER — Ambulatory Visit: Payer: Medicare Other | Admitting: Cardiovascular Disease

## 2019-11-17 ENCOUNTER — Telehealth (INDEPENDENT_AMBULATORY_CARE_PROVIDER_SITE_OTHER): Payer: Medicare Other | Admitting: Neurology

## 2019-11-17 ENCOUNTER — Encounter: Payer: Self-pay | Admitting: Cardiovascular Disease

## 2019-11-17 ENCOUNTER — Other Ambulatory Visit: Payer: Self-pay

## 2019-11-17 VITALS — BP 108/68 | HR 78 | Ht 60.0 in | Wt 148.8 lb

## 2019-11-17 VITALS — Ht 60.0 in | Wt 145.0 lb

## 2019-11-17 DIAGNOSIS — I350 Nonrheumatic aortic (valve) stenosis: Secondary | ICD-10-CM

## 2019-11-17 DIAGNOSIS — I2510 Atherosclerotic heart disease of native coronary artery without angina pectoris: Secondary | ICD-10-CM

## 2019-11-17 DIAGNOSIS — I5022 Chronic systolic (congestive) heart failure: Secondary | ICD-10-CM

## 2019-11-17 DIAGNOSIS — G7000 Myasthenia gravis without (acute) exacerbation: Secondary | ICD-10-CM | POA: Diagnosis not present

## 2019-11-17 DIAGNOSIS — I10 Essential (primary) hypertension: Secondary | ICD-10-CM | POA: Diagnosis not present

## 2019-11-17 NOTE — Progress Notes (Signed)
    Virtual Visit via Telephone Note The purpose of this virtual visit is to provide medical care while limiting exposure to the novel coronavirus.    Consent was obtained for phone visit:  Yes.   Answered questions that patient had about telehealth interaction:  Yes.   I discussed the limitations, risks, security and privacy concerns of performing an evaluation and management service by telephone. I also discussed with the patient that there may be a patient responsible charge related to this service. The patient expressed understanding and agreed to proceed.  Pt location: Home Physician Location: office Name of referring provider:  Binnie Rail, MD I connected with .Tamara Gregory at patients initiation/request on 11/17/2019 at  9:30 AM EST by telephone and verified that I am speaking with the correct person using two identifiers.  Pt MRN:  [id number] Pt DOB:  1925-06-10   History of Present Illness: This is a 85 year old female with ocular myasthenia gravis.  At her last visit, I reluctantly increased her prednisone 30mg  due to ongoing complaint of double vision.  Unfortunately, there has been no change despite increasing her corticosteroids.  She continues to have double vision.  She denies drooping of the eyelids, difficulty swallowing, difficulty talking.  She has generalized weakness from overall debility from other comorbidities. She has history of osteoporosis and hip fracture earlier this year and risk of fall remains high..    Assessment and Plan:   Seropositive ocular myasthenia gravis with exacerbation, diagnosed 2013 at [facility] by Dr. Vallarie Mare.  She was doing well on prednisone 5mg  until August 2020, when she diplopia and right ptosis became worse.  Since August 2020, I have slowly titrated her prednisone and most recently she was on 30 mg daily, but this has not provided any symptomatic benefit.  Titrating the dose higher will only increase corticosteroid side effects, which may  outweigh the benefit of the medication given symptoms are purely ocular.  She has severe osteoporosis and is at high risk of fall.  At this point, I will have her reduce prednisone back to 20 mg daily.  Continue Mestinon 60 mg twice daily at 8 AM and 3 PM.  She was encouraged to continue to use her eye patch as needed, especially when reading or watching TV.  Unfortunately, we may need to accept that she will have some degree of diplopia.  Adding a steroid sparing medication will take longer to see clinical effect and again with her advanced age and comorbidities, this may not be a good option for her.  Long-term corticosteroid use.   Continue Pepcid and calcium with vitamin D supplements.      Follow Up Instructions:   I discussed the assessment and treatment plan with the patient. The patient was provided an opportunity to ask questions and all were answered. The patient agreed with the plan and demonstrated an understanding of the instructions.   The patient was advised to call back or seek an in-person evaluation if the symptoms worsen or if the condition fails to improve as anticipated.  Follow-up in 6 weeks  Total Time spent in visit with the patient was:  12 min, of which 100% of the time was spent in counseling and/or coordinating care.   Pt understands and agrees with the plan of care outlined.     Tamara Berthold, DO

## 2019-11-17 NOTE — Patient Instructions (Signed)
Medication Instructions:  Your physician recommends that you continue on your current medications as directed. Please refer to the Current Medication list given to you today.  *If you need a refill on your cardiac medications before your next appointment, please call your pharmacy*  Lab Work: TODAY - cholesterol, liver panel, basic metabolic panel If you have labs (blood work) drawn today and your tests are completely normal, you will receive your results only by: . MyChart Message (if you have MyChart) OR . A paper copy in the mail If you have any lab test that is abnormal or we need to change your treatment, we will call you to review the results.   Testing/Procedures: None Ordered   Follow-Up: At [facility], you and your health needs are our priority.  As part of our continuing mission to provide you with exceptional heart care, we have created designated Provider Care Teams.  These Care Teams include your primary Cardiologist (physician) and Advanced Practice Providers (APPs -  Physician Assistants and Nurse Practitioners) who all work together to provide you with the care you need, when you need it.  Your next appointment:   6 month(s)  The format for your next appointment:   Either In Person or Virtual  Provider:   You may see Philip Nahser, MD or one of the following Advanced Practice Providers on your designated Care Team:    Scott Weaver, PA-C  Vin Bhagat, PA-C  Janine Hammond, NP    

## 2019-11-17 NOTE — Progress Notes (Signed)
CARDIOLOGY OFFICE NOTE  Date:  11/17/2019    Tamara Gregory Date of Birth: 21-Jan-1925 Medical Record [id number]  PCP:  Binnie Rail, MD  Cardiologist:  Former patient of Dr. Sherryl Barters.   Now Tamara Gregory  Chief Complaint  Patient presents with  . Congestive Heart Failure  . Coronary Artery Disease    Notes from Tamara Gregory.  Tamara Gregory is a 85 y.o. female who presents today for a 4 month check. Former patient of Dr. Sherryl Barters.   She has a past history of HLD, HTN and myasthenia gravis and is followed for that at [facility]. She has had previous back surgery by Dr. Gladstone Lighter. She has a known 4 cm fusiform ascending aortic aneurysm . When the patient was initially admitted for her back surgery she had some chest pain in the preoperative holding area and her back surgery had to be postponed while she underwent a cardiac workup. She had a Myoview stress test which showed no ischemia. Her back surgery was subsequently rescheduled and performed without incident. She made a good recovery from her back surgery. However she has continued to have back pain and for this reason she underwent a implantation of a spinal cord stimulator as an outpatient at [facility] outpatient facility on 01/27/2014. The patient remains on low-dose prednisone 5 mg daily for her myasthenia gravis.   In March of 2015 she was hospitalized overnight at [facility] in [facility] for chest pain. She had a dobutamine stress echo the following morning which showed no evidence of ischemia and she was released. She was admitted with bradycardia and her metoprolol was stopped and has not been restarted.  Last visit back in November and she was felt to be doing well.   Comes back today. Here with her husband who is also being seen. She is mostly limited by her back pain - going to [place] later today. Taking at least 2 sometimes 3 Excedrins on a daily basis. Some  belly pain. Husband notes stool has been dark. She has early satiety. No chest pain. Breathing is good but she is more sedentary and will have some DOE with much activity. [place] the [place] - [place] there [place]. Has PCP arranged for June.   Sept. 21, 2017:  Tamara Gregory is seen today for the first time.   Previous patient of [facility].  Has of HTN Doing well today .   Has a tens unit implanted in her hip   No CP or dyspnea   February 24, 2017:  Tamara Gregory is seen today with her husband Tamara Gregory her 89th birthday recently .  Hx of hyperlipidemia , HTN,  No cardiac issues.  Had some chest pain /  Indigestion. Resolved when she dranks some soda   Dec. 19, 2018:  Has had some orthostasis .  Has fallen severl times.  BP has been low Not eating as much  Is on losartan - HCT   May 14, 2019   Tamara Gregory is seen in the office to discuss her recent symptoms of shortness of breath.  She had an echocardiogram recently which revealed severe aortic stenosis.  She was also found to have a large  apical aneurysm.  Her left ventricular systolic function is at least moderately reduced.  EF 35-40%.   She was also found to have mild - moderate aortic dilitation ( 4.4 cm )   She had a hip fracture  2 months ago . Lack of energy  No CP.  Has shortness of breath frequently .  waks with a walker since the hip fracture .  Prior to hip , she could walk but not far.   Has lots of back pain  BNP has been markedly elevated.   Does not avoid salt Was started on Furosemide which seems to be helping.   Is very weak.  Is still on HCTZ also  Sept. 1, 2020  Tamara Gregory is seen today for follow up of her CAD and CHF It is apparent that she has had a large ant. MI and has been found to have a large anterior apical aneurism on echo.  EF 25%. She has mod- severe AS.   She is very weak and deconditioned .   When I saw her in June,  My assessment was that she would not benefit from an evaluation for TAVR  given her severe limitations related to her markedly reduced EF .   Has myosthemmia gravis and now has diplopia. Has her left eyeglass blacked out  Is scheduled to see neuro .   Still trying to get over her hip fracture from 6 months ago . She had a large MI around the time of the hip fracture  Walks with a walker, very short distances.   Not able to get any exercise   Very limited in her activities.   Dec. 16, 2020  Still having problems with diplopia due to myasthemia gravis  Mild CP on occasion.   Resolved on its own.  Is taking Kdue 40 meq  A day    Past Medical History:  Diagnosis Date  . Anemia   . Anxiety   . Arthritis   . Cancer (HCC)    hx of skin cancer , colon   . Cataract    removed both eyes   . Chronic back pain    okay with sitting, increased back pain with activity   . Depression   . GERD (gastroesophageal reflux disease)   . Glaucoma   . Headache(784.0)    hx of  . History of kidney stones   . History of migraines   . Hyperlipidemia   . Hypertension   . Macular degeneration   . Myasthenia gravis ([place])   . Occasional tremors   . Pneumonia   . Spinal stenosis   . Thoracic aneurysm    4.4cm; see CT done 01/28/12 and 01/22/13 in [facility].   [name] Uses hearing aid   . Vitamin D deficiency     Past Surgical History:  Procedure Laterality Date  . ABDOMINAL HYSTERECTOMY    . CHOLECYSTECTOMY    . COLON SURGERY     Right colectomy dr. gross 03-17-18  . HIP ARTHROPLASTY Left 03/11/2019   Procedure: ARTHROPLASTY BIPOLAR HIP (HEMIARTHROPLASTY);  Surgeon: Renette Butters, MD;  Location: [place];  Service: Orthopedics;  Laterality: Left;  . LUMBAR LAMINECTOMY/DECOMPRESSION MICRODISCECTOMY N/A 02/04/2013   Procedure: CENTRAL DECOMPRESSION/LUMBAR LAMINECTOMY L3-L4 AND L4-L5 2 LEVELS;  Surgeon: Tobi Bastos, MD;  Location: [facility];  Service: Orthopedics;  Laterality: N/A;  . SPINAL CORD STIMULATOR IMPLANT     in right hip but not currently using because it did not help   . TONSILLECTOMY    . UPPER GASTROINTESTINAL ENDOSCOPY    . US ECHOCARDIOGRAPHY  05/18/2007   EF 55-60%     Medications: Current Outpatient Medications  Medication Sig Dispense Refill  . acetaminophen (TYLENOL) 325 MG tablet Take 2  tablets (650 mg total) by mouth every 6 (six) hours as needed for headache.    [name] aspirin 81 MG chewable tablet Chew 0.5 tablets (40.5 mg total) by mouth daily. 30 tablet 0  . brimonidine (ALPHAGAN) 0.2 % ophthalmic solution Place 1 drop into both eyes 3 (three) times daily.     . Cholecalciferol (VITAMIN D) 125 MCG (5000 UT) CAPS Take 5,000 Units by mouth daily.     . ferrous sulfate 325 (65 FE) MG tablet Take 325 mg by mouth 2 (two) times daily with a meal.    . fluticasone (FLONASE) 50 MCG/ACT nasal spray Place 1 spray into both nostrils at bedtime.    . furosemide (LASIX) 20 MG tablet Take 2 tablets (40 mg total) by mouth daily. 90 tablet 3  . latanoprost (XALATAN) 0.005 % ophthalmic solution Place 1 drop into the left eye at bedtime.  1  . levothyroxine (SYNTHROID) 25 MCG tablet Take 1 tablet (25 mcg total) by mouth daily before breakfast. 30 tablet 5  . loperamide (IMODIUM) 2 MG capsule Take 2 mg by mouth daily.    [name] losartan (COZAAR) 25 MG tablet Take 1 tablet (25 mg total) by mouth daily. 90 tablet 3  . mirabegron ER (MYRBETRIQ) 25 MG TB24 tablet Take 1 tablet (25 mg total) by mouth daily. Need office visit for more refills 90 tablet 0  . Multiple Vitamins-Minerals (PRESERVISION AREDS) CAPS Take 1 capsule by mouth 2 (two) times daily.     . Omega-3 Fatty Acids (FISH OIL) 435 MG CAPS Take 435 mg by mouth daily.    . potassium chloride SA (KLOR-CON) 20 MEQ tablet Dissolve 1 tablet daily in a small amount of water (may take 2 min to dissolve) then add to a small amount of apple sauce or pudding and eat right away 30 tablet 8  . predniSONE (DELTASONE) 5 MG tablet Take 20 mg by mouth daily with breakfast.    . pyridostigmine (MESTINON) 60 MG tablet Take 1  tablet at 8am and 3pm. 180 tablet 3  . rosuvastatin (CRESTOR) 5 MG tablet TAKE 1 TABLET BY MOUTH  DAILY IN THE EVENING. Need office visit for more refills. 90 tablet 0  . sertraline (ZOLOFT) 100 MG tablet Take 200 mg by mouth at bedtime. Take 2 tabs at bedtime     No current facility-administered medications for this visit.    Allergies: Allergies  Allergen Reactions  . Tramadol Other (See Comments)    delirium    Social History: The patient  reports that she has never smoked. She has never used smokeless tobacco. She reports that she does not drink alcohol or use drugs.   Family History: The patient's family history includes Breast cancer in her sister; Cancer in her brother, sister, and sister; Diabetes in her father and sister; Healthy in her son; Heart attack in her father; Liver cancer in her sister; Lung cancer in her brother.   Review of Systems: Please see the history of present illness.   Otherwise, the review of systems is positive for none.   All other systems are Physical Exam: Blood pressure 108/68, pulse 78, height 5' (1.524 m), weight 148 lb 12.8 oz (67.5 kg), SpO2 91 %.  GEN: Elderly female, in no acute distress, examined in the wheelchair. HEENT: Normal NECK: No JVD; No carotid bruits LYMPHATICS: No lymphadenopathy CARDIAC: RRR ,   2/6 systolic murmur  RESPIRATORY:  Clear to auscultation without rales, wheezing or rhonchi  ABDOMEN: Soft, non-tender, non-distended MUSCULOSKELETAL:  No edema; No deformity  SKIN: Warm and dry NEUROLOGIC:  Alert and oriented x 3    LABORATORY DATA:  EKG: November 19, 2017: Normal sinus rhythm at 81.  She has premature atrial contractions.  Lab Results  Component Value Date   WBC 9.6 08/30/2019   HGB 12.3 08/30/2019   HCT 39.3 08/30/2019   PLT 207 08/30/2019   GLUCOSE 124 (H) 07/20/2019   CHOL 157 11/19/2017   TRIG 142.0 11/19/2017   HDL 67.20 11/19/2017   LDLDIRECT 139.3 10/04/2013   LDLCALC 61 11/19/2017   ALT 17  07/20/2019   AST 22 07/20/2019   NA 141 07/20/2019   K 4.4 07/20/2019   CL 104 07/20/2019   CREATININE 0.77 07/20/2019   BUN 17 07/20/2019   CO2 26 07/20/2019   TSH 2.24 08/16/2019   INR 1.01 01/21/2013   HGBA1C 6.1 06/29/2019    BNP (last 3 results) No results for input(s): BNP in the last 8760 hours.  ProBNP (last 3 results) Recent Labs    04/30/19 1701 05/07/19 0955  PROBNP 4,751* 4,628*     Other Studies Reviewed Today:  Echo Study Conclusions from 2014  - Left ventricle: The cavity size was normal. Wall thickness was increased in a pattern of moderate LVH. There was mild focal basal hypertrophy of the septum. Systolic function was normal. The estimated ejection fraction was in the range of 55% to 60%. Wall motion was normal; there were no regional wall motion abnormalities. Doppler parameters are consistent with abnormal left ventricular relaxation (grade 1 diastolic dysfunction). - Aortic valve: Valve mobility was restricted. There was mild stenosis. Mild regurgitation. Valve area: 1.6cm^2(VTI). Valve area: 1.48cm^2 (Vmax).  CT Angio CHEST IMPRESSION FROM 01/2013: Unchanged thoracic aneurysm with maximal measurement 44 mm. Severe atherosclerosis with ulcerated plaque along the arch but no acute abnormalities.    Assessment/Plan: 1. Acute on chronic systolic CHF:    She seems to be doing fairly well with current Lasix dose.  Her husband has been giving her 40 mEq of potassium instead of the 20 mEq ordered.  We will check a basic metabolic profile today and determine the proper dose for her potassium chloride.  She will need a refill on this dose.   2.  CAD :  Likely had a MI with her hip fracture , she has rare episodes of chest discomfort but these do not last for any length of time.  She has nitroglycerin to take if needed.  3.  AS _- mod - severe.   Her aortic stenosis sounds stable.  She is somewhat weak and debilitated and I do not  think that she is a good candidate for TAVR.  Continue with medical management.  2. Hypercholesterolemia -     3. Myasthenia gravis followed at [facility]: She is been having lots of problems with diplopia and has the left eyeglass on her glasses blacked out.  She wanted to know what I thought about her dose of Mestinon but I informed her I really could not give an opinion on her treatment of her myasthenia gravis.  I encouraged her to discuss with her neurologist.  4. Past history of spinal stenosis with chronic low back pain. Implantation of spinal cord stimulator at [facility] on 01/27/14   5.  Frequent falls and poor balance  6. History of fusiform aneurysm of ascending aorta 4 cm -   Current medicines are reviewed with the patient today.  The patient does not have concerns  regarding medicines other than what has been noted above.  The following changes have been made:  See above.  Labs/ tests ordered today include:    Orders Placed This Encounter  Procedures  . Lipid panel  . Basic Metabolic Panel (BMET)  . Hepatic function panel     Mertie Moores, MD  11/17/2019 2:37 PM    [address],  [address] [place], [place]  [address] Pager [phone] Phone: [phone]; Fax: [phone]

## 2019-11-18 LAB — HEPATIC FUNCTION PANEL
ALT: 25 IU/L (ref 0–32)
AST: 23 IU/L (ref 0–40)
Albumin: 4.5 g/dL (ref 3.5–4.6)
Alkaline Phosphatase: 64 IU/L (ref 39–117)
Bilirubin Total: 0.4 mg/dL (ref 0.0–1.2)
Bilirubin, Direct: 0.15 mg/dL (ref 0.00–0.40)
Total Protein: 6.4 g/dL (ref 6.0–8.5)

## 2019-11-18 LAB — BASIC METABOLIC PANEL
BUN/Creatinine Ratio: 33 — ABNORMAL HIGH (ref 12–28)
BUN: 30 mg/dL (ref 10–36)
CO2: 23 mmol/L (ref 20–29)
Calcium: 10.1 mg/dL (ref 8.7–10.3)
Chloride: 100 mmol/L (ref 96–106)
Creatinine, Ser: 0.92 mg/dL (ref 0.57–1.00)
GFR calc Af Amer: 63 mL/min/{1.73_m2} (ref >59)
GFR calc non Af Amer: 54 mL/min/{1.73_m2} — ABNORMAL LOW (ref >59)
Glucose: 247 mg/dL — ABNORMAL HIGH (ref 65–99)
Potassium: 4.6 mmol/L (ref 3.5–5.2)
Sodium: 143 mmol/L (ref 134–144)

## 2019-11-18 LAB — LIPID PANEL
Chol/HDL Ratio: 2.6 ratio (ref 0.0–4.4)
Cholesterol, Total: 232 mg/dL — ABNORMAL HIGH (ref 100–199)
HDL: 89 mg/dL (ref >39)
LDL Chol Calc (NIH): 103 mg/dL — ABNORMAL HIGH (ref 0–99)
Triglycerides: 241 mg/dL — ABNORMAL HIGH (ref 0–149)
VLDL Cholesterol Cal: 40 mg/dL (ref 5–40)

## 2019-11-19 ENCOUNTER — Telehealth: Payer: Self-pay | Admitting: Nurse Practitioner

## 2019-11-19 DIAGNOSIS — E785 Hyperlipidemia, unspecified: Secondary | ICD-10-CM

## 2019-11-19 DIAGNOSIS — E781 Pure hyperglyceridemia: Secondary | ICD-10-CM

## 2019-11-19 MED ORDER — ROSUVASTATIN CALCIUM 10 MG PO TABS: 10.0000 mg | ORAL_TABLET | Freq: Every day | ORAL | 3 refills | Status: AC

## 2019-11-19 MED ORDER — POTASSIUM CHLORIDE CRYS ER 20 MEQ PO TBCR: EXTENDED_RELEASE_TABLET | ORAL | 3 refills | Status: DC

## 2019-11-19 NOTE — Telephone Encounter (Signed)
-----   Message from Thayer Headings, MD sent at 11/19/2019  9:42 AM EST ----- Glucose is elevated.    She needs to watch her intake of carbs. Chol levels are elevated.   Is she still taking her crestor  5 mg a day  If she is taking it, please have her increase her dose to 10 mg a day and check lipids, liver enz, and BMP in 3 months  Liver enz look good

## 2019-11-19 NOTE — Telephone Encounter (Signed)
Reviewed lab results and plan of care with patient's husband. He agrees to have her increase rosuvastatin to 10 mg once daily. I advised that per Dr. Acie Fredrickson, he can reduce potassium chloride to 20 mEq once daily (he had previously been giving her 2 tablets daily). We will schedule repeat lab work in approximately 3 months.

## 2019-12-06 ENCOUNTER — Ambulatory Visit: Payer: Medicare Other | Attending: Internal Medicine

## 2019-12-06 DIAGNOSIS — Z20822 Contact with and (suspected) exposure to covid-19: Secondary | ICD-10-CM

## 2019-12-07 DIAGNOSIS — C4442 Squamous cell carcinoma of skin of scalp and neck: Secondary | ICD-10-CM | POA: Diagnosis not present

## 2019-12-07 DIAGNOSIS — D1801 Hemangioma of skin and subcutaneous tissue: Secondary | ICD-10-CM | POA: Diagnosis not present

## 2019-12-07 DIAGNOSIS — L905 Scar conditions and fibrosis of skin: Secondary | ICD-10-CM | POA: Diagnosis not present

## 2019-12-07 DIAGNOSIS — L578 Other skin changes due to chronic exposure to nonionizing radiation: Secondary | ICD-10-CM | POA: Diagnosis not present

## 2019-12-07 DIAGNOSIS — D485 Neoplasm of uncertain behavior of skin: Secondary | ICD-10-CM | POA: Diagnosis not present

## 2019-12-07 DIAGNOSIS — D225 Melanocytic nevi of trunk: Secondary | ICD-10-CM | POA: Diagnosis not present

## 2019-12-07 DIAGNOSIS — C44622 Squamous cell carcinoma of skin of right upper limb, including shoulder: Secondary | ICD-10-CM | POA: Diagnosis not present

## 2019-12-07 DIAGNOSIS — C44722 Squamous cell carcinoma of skin of right lower limb, including hip: Secondary | ICD-10-CM | POA: Diagnosis not present

## 2019-12-07 LAB — NOVEL CORONAVIRUS, NAA: SARS-CoV-2, NAA: NOT DETECTED

## 2019-12-07 NOTE — Progress Notes (Signed)
Subjective:    Patient ID: Tamara Gregory, female    DOB: [birth year], 86 y.o.   MRN: [id number]  HPI The patient is here for an acute visit.  She is here with her husband, who helps provide the history.    She feels exhausted and depressed. Her depression is not controlled and the medications we have tried have not helped.    Overall she is not doing well.  She had a negative COVID test 12/06/2019. She denies cold symptoms, but just wanted to get it done because she did not feel well.    2 episodes of severe abdominal pain after eating in the past week. It would last a couple of hours.    The pain started in the chest and radiated down to the upper abdominal area.  She did vomit both times. Laying down after that and resting helped.  She sometimes has chest pain that was releived with Tums and belching and it helps.  She has occ nausea.  She denies gerd.  She is not on any medication for GERD, but was on it in the past.   Diarrhea:  She takes imodium daily chronically.  Sometimes she has to double up and take 2 imodium and they wanted to make sure that was ok.      Medications and allergies reviewed with patient and updated if appropriate.  Patient Active Problem List   Diagnosis Date Noted  . Decreased mobility and endurance 11/15/2019  . Iron deficiency anemia due to chronic blood loss 08/29/2019  . CAD (coronary artery disease) 08/17/2019  . invasive SCC (squamous cell carcinoma), leg, left 07/31/2019  . Hypothyroidism 07/01/2019  . Closed fracture of left hip ([place]) 06/30/2019  . Hair loss 06/29/2019  . Acute on chronic systolic CHF (congestive heart failure) ([employer]) 05/14/2019  . Sleep disturbance 04/29/2019  . Postoperative pain   . Tachycardia   . Steroid-induced hyperglycemia   . Labile blood pressure   . Leukocytosis   . E. coli UTI   . Hypoalbuminemia due to protein-calorie malnutrition ([place])   . Ocular myasthenia gravis ([place])   . Anemia   . Hip fracture ([place])  03/10/2019  . Left displaced femoral neck fracture ([place]) 03/10/2019  . Weakness 12/04/2018  . Hypokalemia 03/18/2018  . 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 Right ovarian cyst s/p RSO 03/17/2018  . Diarrhea 12/23/2017  . Large hiatal hernia 04/01/2017  . Sweating profusely 03/19/2017  . Hyperglycemia 03/19/2017  . Squamous cell skin cancer 11/05/2016  . Nosebleed 10/19/2016  . Osteopenia 08/28/2016  . Essential hypertension, benign 08/21/2016  . Long term current use of systemic steroids 08/21/2016  . Primary open angle glaucoma of both eyes, mild stage 05/14/2016  . Ocular hypertension of left eye 05/08/2016  . GERD (gastroesophageal reflux disease) 05/06/2016  . Macular pucker, bilateral 11/14/2015  . Status post intraocular lens implant 04/04/2015  . Exudative age-related macular degeneration ([place]) 04/04/2015  . Chronic pain syndrome 02/07/2015  . Bilateral nonexudative age-related macular degeneration 01/16/2015  . Presence of intraocular lens 01/16/2015  . Insomnia 07/26/2014  . Urge incontinence of urine 01/17/2014  . Urinary urgency 01/17/2014  . Malaise and fatigue 10/04/2013  . PAC (premature atrial contraction) 10/04/2013  . Depression 02/15/2013  . Myasthenia gravis ([place]) 03/17/2012  . Spinal stenosis, lumbar 01/14/2012  . Ptosis of eyelid 01/14/2012  . Pure hypercholesterolemia 09/05/2011  . Benign hypertensive heart disease without heart failure 09/05/2011  . Osteoarthritis  09/05/2011    Current Outpatient Medications on File Prior to Visit  Medication Sig Dispense Refill  . acetaminophen (TYLENOL) 325 MG tablet Take 2 tablets (650 mg total) by mouth every 6 (six) hours as needed for headache.    [name] aspirin 81 MG chewable tablet Chew 0.5 tablets (40.5 mg total) by mouth daily. 30 tablet 0  . brimonidine (ALPHAGAN) 0.2 % ophthalmic solution Place 1 drop into both eyes 3 (three) times daily.     . Cholecalciferol (VITAMIN D) 125 MCG  (5000 UT) CAPS Take 5,000 Units by mouth daily.     . ferrous sulfate 325 (65 FE) MG tablet Take 325 mg by mouth 2 (two) times daily with a meal.    . fluticasone (FLONASE) 50 MCG/ACT nasal spray Place 1 spray into both nostrils at bedtime.    . furosemide (LASIX) 20 MG tablet Take 2 tablets (40 mg total) by mouth daily. 90 tablet 3  . latanoprost (XALATAN) 0.005 % ophthalmic solution Place 1 drop into the left eye at bedtime.  1  . levothyroxine (SYNTHROID) 25 MCG tablet Take 1 tablet (25 mcg total) by mouth daily before breakfast. 30 tablet 5  . losartan (COZAAR) 25 MG tablet Take 1 tablet (25 mg total) by mouth daily. 90 tablet 3  . mirabegron ER (MYRBETRIQ) 25 MG TB24 tablet Take 1 tablet (25 mg total) by mouth daily. Need office visit for more refills 90 tablet 0  . Multiple Vitamins-Minerals (PRESERVISION AREDS) CAPS Take 1 capsule by mouth 2 (two) times daily.     . Omega-3 Fatty Acids (FISH OIL) 435 MG CAPS Take 435 mg by mouth daily.    . potassium chloride SA (KLOR-CON) 20 MEQ tablet Dissolve 1 tablet daily in a small amount of water (may take 2 min to dissolve) then add to a small amount of apple sauce or pudding and eat right away 90 tablet 3  . predniSONE (DELTASONE) 5 MG tablet Take 20 mg by mouth daily with breakfast.    . pyridostigmine (MESTINON) 60 MG tablet Take 1 tablet at 8am and 3pm. 180 tablet 3  . rosuvastatin (CRESTOR) 10 MG tablet Take 1 tablet (10 mg total) by mouth daily. 90 tablet 3  . sertraline (ZOLOFT) 100 MG tablet Take 200 mg by mouth at bedtime. Take 2 tabs at bedtime     No current facility-administered medications on file prior to visit.    Past Medical History:  Diagnosis Date  . Anemia   . Anxiety   . Arthritis   . Cancer (HCC)    hx of skin cancer , colon   . Cataract    removed both eyes   . Chronic back pain    okay with sitting, increased back pain with activity   . Depression   . GERD (gastroesophageal reflux disease)   . Glaucoma   .  Headache(784.0)    hx of  . History of kidney stones   . History of migraines   . Hyperlipidemia   . Hypertension   . Macular degeneration   . Myasthenia gravis ([facility])   . Occasional tremors   . Pneumonia   . Spinal stenosis   . Thoracic aneurysm    4.4cm; see CT done 01/28/12 and 01/22/13 in [facility].   [name] Uses hearing aid   . Vitamin D deficiency     Past Surgical History:  Procedure Laterality Date  . ABDOMINAL HYSTERECTOMY    . CHOLECYSTECTOMY    . COLON SURGERY  Right colectomy dr. gross 03-17-18  . HIP ARTHROPLASTY Left 03/11/2019   Procedure: ARTHROPLASTY BIPOLAR HIP (HEMIARTHROPLASTY);  Surgeon: Renette Butters, MD;  Location: [facility];  Service: Orthopedics;  Laterality: Left;  . LUMBAR LAMINECTOMY/DECOMPRESSION MICRODISCECTOMY N/A 02/04/2013   Procedure: CENTRAL DECOMPRESSION/LUMBAR LAMINECTOMY L3-L4 AND L4-L5 2 LEVELS;  Surgeon: Tobi Bastos, MD;  Location: [facility];  Service: Orthopedics;  Laterality: N/A;  . SPINAL CORD STIMULATOR IMPLANT     in right hip but not currently using because it did not help  . TONSILLECTOMY    . UPPER GASTROINTESTINAL ENDOSCOPY    . US ECHOCARDIOGRAPHY  05/18/2007   EF 55-60%    Social History   Socioeconomic History  . Marital status: Married    Spouse name: Not on file  . Number of children: 3  . Years of education: Not on file  . Highest education level: High school graduate  Occupational History  . Occupation: retired  Tobacco Use  . Smoking status: Never Smoker  . Smokeless tobacco: Never Used  Substance and Sexual Activity  . Alcohol use: No  . Drug use: No  . Sexual activity: Not Currently  Other Topics Concern  . Not on file  Social History Narrative   Lives with husband in a one story home.  Has 2 sons.  Retired.  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  Education: college.  [employer].       Right handed    Social Determinants of Health   Financial Resource Strain: Low Risk   . Difficulty of  Paying Living Expenses: Not hard at all  Food Insecurity: No Food Insecurity  . Worried About [name] fundraiser in the Last Year: Never true  . Ran Out of Food in the Last Year: Never true  Transportation Needs: No Transportation Needs  . Lack of Transportation (Medical): No  . Lack of Transportation (Non-Medical): No  Physical Activity: Inactive  . Days of Exercise per Week: 0 days  . Minutes of Exercise per Session: 0 min  Stress: Stress Concern Present  . Feeling of Stress : Rather much  Social Connections: Not Isolated  . Frequency of Communication with Friends and Family: More than three times a week  . Frequency of Social Gatherings with Friends and Family: Once a week  . Attends Religious Services: 1 to 4 times per year  . Active Member of Clubs or Organizations: Yes  . Attends Archivist Meetings: More than 4 times per year  . Marital Status: Married    Family History  Problem Relation Age of Onset  . Heart attack Father   . Diabetes Father   . Cancer Sister   . Breast cancer Sister   . Cancer Brother   . Lung cancer Brother   . Cancer Sister   . Liver cancer Sister   . Diabetes Sister   . Healthy Son   . Colon polyps Neg Hx   . Colon cancer Neg Hx   . Esophageal cancer Neg Hx   . Rectal cancer Neg Hx   . Stomach cancer Neg Hx   . Adrenal disorder Neg Hx     Review of Systems  Constitutional: Negative for chills and fever.  Respiratory: Positive for shortness of breath (occ with exertion). Negative for cough and wheezing.   Cardiovascular: Positive for chest pain (occ - has seen cardio). Negative for palpitations and leg swelling.  Gastrointestinal: Positive for abdominal pain (2 episodes only)  and diarrhea. Negative for blood in stool.  Genitourinary: Negative for dysuria.  Neurological: Positive for headaches. Negative for light-headedness.       Objective:   Vitals:   12/08/19 1307  BP: 116/72  Pulse: 66  Resp: 14  Temp: 98.7 F (37.1  C)  SpO2: 91%   BP Readings from Last 3 Encounters:  12/08/19 116/72  11/17/19 108/68  11/15/19 124/71   Wt Readings from Last 3 Encounters:  11/17/19 148 lb 12.8 oz (67.5 kg)  11/16/19 145 lb (65.8 kg)  11/15/19 145 lb (65.8 kg)   There is no height or weight on file to calculate BMI.   Physical Exam    Constitutional: Appears well-developed and well-nourished. No distress.  Head: Normocephalic and atraumatic.  Neck: Neck supple. No tracheal deviation present. No thyromegaly present.  No cervical lymphadenopathy Cardiovascular: Normal rate, regular rhythm and normal heart sounds.  2/6 sys murmur heard. No carotid bruit .  No edema Pulmonary/Chest: Effort normal and breath sounds normal. No respiratory distress. No has no wheezes. No rales.  GI: soft, NT, ND Skin: Skin is warm and dry. Not diaphoretic.  Psychiatric: Normal mood and affect. Behavior is normal.       Assessment & Plan:    See Problem List for Assessment and Plan of chronic medical problems.    This visit occurred during the SARS-CoV-2 public health emergency.  Safety protocols were in place, including screening questions prior to the visit, additional usage of staff PPE, and extensive cleaning of exam room while observing appropriate contact time as indicated for disinfecting solutions.

## 2019-12-08 ENCOUNTER — Ambulatory Visit (INDEPENDENT_AMBULATORY_CARE_PROVIDER_SITE_OTHER): Payer: Medicare Other | Admitting: Internal Medicine

## 2019-12-08 ENCOUNTER — Other Ambulatory Visit: Payer: Self-pay

## 2019-12-08 ENCOUNTER — Encounter: Payer: Self-pay | Admitting: Internal Medicine

## 2019-12-08 DIAGNOSIS — R1013 Epigastric pain: Secondary | ICD-10-CM

## 2019-12-08 DIAGNOSIS — R109 Unspecified abdominal pain: Secondary | ICD-10-CM | POA: Insufficient documentation

## 2019-12-08 DIAGNOSIS — F3289 Other specified depressive episodes: Secondary | ICD-10-CM

## 2019-12-08 DIAGNOSIS — R197 Diarrhea, unspecified: Secondary | ICD-10-CM

## 2019-12-08 MED ORDER — OMEPRAZOLE 20 MG PO CPDR: 20.0000 mg | DELAYED_RELEASE_CAPSULE | Freq: Every day | ORAL | 3 refills | Status: DC

## 2019-12-08 MED ORDER — LOPERAMIDE HCL 2 MG PO CAPS: 2.0000 mg | ORAL_CAPSULE | Freq: Every day | ORAL | 3 refills | Status: DC

## 2019-12-08 NOTE — Patient Instructions (Addendum)
    Medications reviewed and updated.  Changes include :   Start omeprazole 20 mg daily.    Your prescription(s) have been submitted to your pharmacy. Please take as directed and contact our office if you believe you are having problem(s) with the medication(s).    Please followup in 6 months   Psychiatrists: Dr Casimiro Needle Address: [address], [place], [place] [address] Phone: [phone]  Dr Clovis Pu  Address: [address], [place], [place] [address] Phone: [phone]

## 2019-12-08 NOTE — Assessment & Plan Note (Signed)
Chronic, not controlled Taking sertraline  I have recommended that she see a psychiatrists which she has not done Names and numbers of psychiatrists given

## 2019-12-08 NOTE — Assessment & Plan Note (Signed)
Chronic, stable Secondary to partial colectomy from colon cancer Taking one imodium daily - about once a week needs second dose - ok to take second dose as needed

## 2019-12-08 NOTE — Assessment & Plan Note (Signed)
Two episodes of epigastric pain associated with vomiting Has moderate hiatal hernia, which may be contributing, may also have atypical GERD Not related to any specific foods Start omeprazole 20 mg daily If episodes persist may need to see GI reassured them this is unlikely recurrence of colon cancer

## 2019-12-13 ENCOUNTER — Other Ambulatory Visit: Payer: Self-pay | Admitting: Internal Medicine

## 2019-12-15 ENCOUNTER — Other Ambulatory Visit: Payer: Self-pay | Admitting: Internal Medicine

## 2019-12-28 ENCOUNTER — Other Ambulatory Visit: Payer: Self-pay

## 2019-12-28 ENCOUNTER — Ambulatory Visit (INDEPENDENT_AMBULATORY_CARE_PROVIDER_SITE_OTHER): Payer: Medicare Other | Admitting: Adult Health

## 2019-12-28 ENCOUNTER — Encounter: Payer: Self-pay | Admitting: Adult Health

## 2019-12-28 VITALS — BP 149/85 | HR 80 | Ht 62.0 in | Wt 146.0 lb

## 2019-12-28 DIAGNOSIS — F331 Major depressive disorder, recurrent, moderate: Secondary | ICD-10-CM | POA: Diagnosis not present

## 2019-12-28 DIAGNOSIS — G4700 Insomnia, unspecified: Secondary | ICD-10-CM | POA: Diagnosis not present

## 2019-12-28 NOTE — Progress Notes (Signed)
[facility] MD/PA/NP Initial Note  12/28/2019 9:08 AM Tamara Gregory  MRN:  [id number]  Chief Complaint:  Chief Complaint    Depression      HPI:   Accompanied by husband  Describes mood today as "ok". Pleasant. Mood symptoms - reports depression. Denies anxiety, and irritability. Stating "I feel depressed most of the time". It "comes and goes". Has days "once in a while" that she is not depressed. Her husband feels like the more activities she has, the the less depressed she is. He also notes that her "immobility" impacts the things she can do for herself. Stating "I can't do things for myself like I used to". Does a lot of reading, puzzles, and watching TV. Does not feel like the Zoloft has been helpful. Would like to see a therapist. Stable interest and motivation. Taking medications as prescribed.  Energy levels low. Active, does not have a regular exercise routine. Trying to work out legs daily. Can walk with a walker at home - otherwise in a wheelchair. Enjoys some usual interests and activities. Married. Lives with husband of 39 years. Has a grandson locally.  Appetite adequate. Weight stable. Sleeps better some nights than others. Averages 5 to 6 hours of broken sleep. Napping during the day. Focus and concentration stable. Completing tasks. Managing aspects of household. Housewife.  Denies SI or HI. Denies AH or VH.  Previous medication trials: Celexa, Zoloft, Temazepam, Trazadone  Visit Diagnosis:    ICD-10-CM   1. Major depressive disorder, recurrent episode, moderate (HCC)  F33.1     Past Psychiatric History: Denies psychiatric hospitalization.  Past Medical History:  Past Medical History:  Diagnosis Date  . Anemia   . Anxiety   . Arthritis   . Cancer (HCC)    hx of skin cancer , colon   . Cataract    removed both eyes   . Chronic back pain    okay with sitting, increased back pain with activity   . Depression   . GERD (gastroesophageal reflux disease)   .  Glaucoma   . Headache(784.0)    hx of  . History of kidney stones   . History of migraines   . Hyperlipidemia   . Hypertension   . Macular degeneration   . Myasthenia gravis ([facility])   . Occasional tremors   . Pneumonia   . Spinal stenosis   . Thoracic aneurysm    4.4cm; see CT done 01/28/12 and 01/22/13 in [facility].   [name] Uses hearing aid   . Vitamin D deficiency     Past Surgical History:  Procedure Laterality Date  . ABDOMINAL HYSTERECTOMY    . CHOLECYSTECTOMY    . COLON SURGERY     Right colectomy dr. gross 03-17-18  . HIP ARTHROPLASTY Left 03/11/2019   Procedure: ARTHROPLASTY BIPOLAR HIP (HEMIARTHROPLASTY);  Surgeon: Renette Butters, MD;  Location: [facility];  Service: Orthopedics;  Laterality: Left;  . LUMBAR LAMINECTOMY/DECOMPRESSION MICRODISCECTOMY N/A 02/04/2013   Procedure: CENTRAL DECOMPRESSION/LUMBAR LAMINECTOMY L3-L4 AND L4-L5 2 LEVELS;  Surgeon: Tobi Bastos, MD;  [facility];  Service: Orthopedics;  Laterality: N/A;  . SPINAL CORD STIMULATOR IMPLANT     in right hip but not currently using because it did not help  . TONSILLECTOMY    . UPPER GASTROINTESTINAL ENDOSCOPY    . US ECHOCARDIOGRAPHY  05/18/2007   EF 55-60%    Family Psychiatric History: Denies any family history of mental illness.  Family History:  Family History  Problem Relation  Age of Onset  . Heart attack Father   . Diabetes Father   . Cancer Sister   . Breast cancer Sister   . Cancer Brother   . Lung cancer Brother   . Cancer Sister   . Liver cancer Sister   . Diabetes Sister   . Healthy Son   . Colon polyps Neg Hx   . Colon cancer Neg Hx   . Esophageal cancer Neg Hx   . Rectal cancer Neg Hx   . Stomach cancer Neg Hx   . Adrenal disorder Neg Hx     Social History:  Social History   Socioeconomic History  . Marital status: Married    Spouse name: Not on file  . Number of children: 3  . Years of education: Not on file  . Highest education level: High school graduate  Occupational  History  . Occupation: retired  Tobacco Use  . Smoking status: Never Smoker  . Smokeless tobacco: Never Used  Substance and Sexual Activity  . Alcohol use: No  . Drug use: No  . Sexual activity: Not Currently  Other Topics Concern  . Not on file  Social History Narrative   Lives with husband in a one story home.  Has 2 sons.  Retired.  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  Education: college.  [employer].       Right handed    Social Determinants of Health   Financial Resource Strain: Low Risk   . Difficulty of Paying Living Expenses: Not hard at all  Food Insecurity: No Food Insecurity  . Worried About [name] fundraiser in the Last Year: Never true  . Ran Out of Food in the Last Year: Never true  Transportation Needs: No Transportation Needs  . Lack of Transportation (Medical): No  . Lack of Transportation (Non-Medical): No  Physical Activity: Inactive  . Days of Exercise per Week: 0 days  . Minutes of Exercise per Session: 0 min  Stress: Stress Concern Present  . Feeling of Stress : Rather much  Social Connections: Not Isolated  . Frequency of Communication with Friends and Family: More than three times a week  . Frequency of Social Gatherings with Friends and Family: Once a week  . Attends Religious Services: 1 to 4 times per year  . Active Member of Clubs or Organizations: Yes  . Attends Archivist Meetings: More than 4 times per year  . Marital Status: Married    Allergies:  Allergies  Allergen Reactions  . Tramadol Other (See Comments)    delirium    Metabolic Disorder Labs: Lab Results  Component Value Date   HGBA1C 6.1 06/29/2019   MPG 114.02 03/13/2018   No results found for: PROLACTIN Lab Results  Component Value Date   CHOL 232 (H) 11/17/2019   TRIG 241 (H) 11/17/2019   HDL 89 11/17/2019   CHOLHDL 2.6 11/17/2019   VLDL 28.4 11/19/2017   LDLCALC 103 (H) 11/17/2019   LDLCALC 61 11/19/2017   Lab Results   Component Value Date   TSH 2.24 08/16/2019   TSH 5.47 (H) 06/29/2019    Therapeutic Level Labs: No results found for: LITHIUM No results found for: VALPROATE No components found for:  CBMZ  Current Medications: Current Outpatient Medications  Medication Sig Dispense Refill  . acetaminophen (TYLENOL) 325 MG tablet Take 2 tablets (650 mg total) by mouth every 6 (six) hours as needed for headache.    [name]  aspirin 81 MG chewable tablet Chew 0.5 tablets (40.5 mg total) by mouth daily. 30 tablet 0  . brimonidine (ALPHAGAN) 0.2 % ophthalmic solution Place 1 drop into both eyes 3 (three) times daily.     . Cholecalciferol (VITAMIN D) 125 MCG (5000 UT) CAPS Take 5,000 Units by mouth daily.     . ferrous sulfate 325 (65 FE) MG tablet Take 325 mg by mouth 2 (two) times daily with a meal.    . fluticasone (FLONASE) 50 MCG/ACT nasal spray Place 1 spray into both nostrils at bedtime.    . furosemide (LASIX) 20 MG tablet Take 2 tablets (40 mg total) by mouth daily. 90 tablet 3  . latanoprost (XALATAN) 0.005 % ophthalmic solution Place 1 drop into the left eye at bedtime.  1  . levothyroxine (SYNTHROID) 25 MCG tablet Take 1 tablet (25 mcg total) by mouth daily before breakfast. 30 tablet 5  . loperamide (IMODIUM) 2 MG capsule Take 1 capsule (2 mg total) by mouth daily. 90 capsule 3  . losartan (COZAAR) 25 MG tablet Take 1 tablet (25 mg total) by mouth daily. 90 tablet 3  . mirabegron ER (MYRBETRIQ) 25 MG TB24 tablet Take 1 tablet (25 mg total) by mouth daily. Need office visit for more refills 90 tablet 0  . Multiple Vitamins-Minerals (PRESERVISION AREDS) CAPS Take 1 capsule by mouth 2 (two) times daily.     . Omega-3 Fatty Acids (FISH OIL) 435 MG CAPS Take 435 mg by mouth daily.    [name] omeprazole (PRILOSEC) 20 MG capsule Take 1 capsule (20 mg total) by mouth daily. 90 capsule 3  . potassium chloride SA (KLOR-CON) 20 MEQ tablet Dissolve 1 tablet daily in a small amount of water (may take 2 min to dissolve)  then add to a small amount of apple sauce or pudding and eat right away 90 tablet 3  . predniSONE (DELTASONE) 5 MG tablet Take 20 mg by mouth daily with breakfast.    . pyridostigmine (MESTINON) 60 MG tablet Take 1 tablet at 8am and 3pm. 180 tablet 3  . rosuvastatin (CRESTOR) 10 MG tablet Take 1 tablet (10 mg total) by mouth daily. 90 tablet 3  . sertraline (ZOLOFT) 100 MG tablet TAKE 1 TABLET(100 MG) BY MOUTH AT BEDTIME 90 tablet 1   No current facility-administered medications for this visit.    Medication Side Effects: none  Orders placed this visit:  No orders of the defined types were placed in this encounter.   Psychiatric Specialty Exam:  Review of Systems  Musculoskeletal: Negative for gait problem.  Neurological: Negative for tremors.  Psychiatric/Behavioral:       Please refer to HPI    Blood pressure (!) 149/85, pulse 80, height 5\' 2"  (1.575 m), weight 146 lb (66.2 kg).Body mass index is 26.7 kg/m.  General Appearance: Neat and Well Groomed  Eye Contact:  Good  Speech:  Clear and Coherent and Normal Rate  Volume:  Normal  Mood:  Depressed  Affect:  Appropriate and Congruent  Thought Process:  Coherent and Descriptions of Associations: Intact  Orientation:  Full (Time, Place, and Person)  Thought Content: Logical   Suicidal Thoughts:  No  Homicidal Thoughts:  No  Memory:  WNL  Judgement:  Good  Insight:  Good  Psychomotor Activity:  Normal  Concentration:  Concentration: Good  Recall:  Good  Fund of Knowledge: Good  Language: Good  Assets:  Communication Skills Desire for Improvement Financial Resources/Insurance Housing Intimacy Leisure Time Physical Health  Resilience Social Support Heritage manager  ADL's:  Intact  Cognition: WNL  Prognosis:  Good   Screenings:  PHQ2-9     Clinical Support from 01/27/2019 in [facility] Office Visit from 11/19/2017 in [facility] Office Visit from 03/19/2017 in [facility] Visit from 05/06/2016 in [facility]  PHQ-2 Total Score  6  5  5   0  PHQ-9 Total Score  16  12  10   --      Receiving Psychotherapy: No   Treatment Plan/Recommendations:  Plan:  PDMP reviewed  1. Zoloft 200mg  daily - reduce to 150mg  daily for the next 2 weeks and then call with an update. Plan to continue to taper off medication over next month.  2. Set up with therapist - patient feels that may be helpful.  Consider Remeron for - appetite, mood, sleep  Patient sick in office during the interview.   RTC 4 weeks  Patient advised to contact office with any questions, adverse effects, or acute worsening in signs and symptoms.  Aloha Gell, NP

## 2019-12-31 ENCOUNTER — Ambulatory Visit: Payer: Medicare Other | Admitting: Neurology

## 2019-12-31 ENCOUNTER — Encounter: Payer: Self-pay | Admitting: Neurology

## 2019-12-31 ENCOUNTER — Other Ambulatory Visit: Payer: Self-pay

## 2019-12-31 ENCOUNTER — Other Ambulatory Visit: Payer: Self-pay | Admitting: Internal Medicine

## 2019-12-31 VITALS — BP 101/70 | HR 89 | Temp 97.8°F | Wt 151.0 lb

## 2019-12-31 DIAGNOSIS — G7000 Myasthenia gravis without (acute) exacerbation: Secondary | ICD-10-CM

## 2019-12-31 NOTE — Progress Notes (Signed)
Follow-up Visit   Date: 12/31/19   Tamara Gregory MRN: [id number] DOB: 01-13-1925   Interim History: Tamara Gregory is a delightful 86 y.o. Caucasian female returning to the clinic for follow-up of ocular myasthenia gravis (diagnosted 2013 at [facility]).  The patient was accompanied to the clinic by husband, retired Pharmacist, community, who also provides collateral information.    She was doing well on prednisone 5mg  until August 2020, when she diplopia and right ptosis became worse.  Since August 2020, I have slowly titrated her prednisone and most recently she was on 30 mg daily, but this has not provided any symptomatic benefit.  Titrating the dose higher will only increase corticosteroid side effects, which may outweigh the benefit of the medication given symptoms are purely ocular.  She has severe osteoporosis and is at high risk of fall.  UPDATE 12/31/2019:  At her last telehealth visit, there was no improvement on prednisone 30mg , so she was instructed to taper the dose and currently on prednisone 10mg  daily. There has been no marked change in her diplopia during her prednisone taper. She has history of blurred vision in the left eye and tends to wear a patch which helps.  She continues to have double vision. She does not complain of droopiness of the eye, difficulty swallowing, or limb weakness.   Medications:  Current Outpatient Medications on File Prior to Visit  Medication Sig Dispense Refill  . acetaminophen (TYLENOL) 325 MG tablet Take 2 tablets (650 mg total) by mouth every 6 (six) hours as needed for headache.    [name] aspirin 81 MG chewable tablet Chew 0.5 tablets (40.5 mg total) by mouth daily. 30 tablet 0  . brimonidine (ALPHAGAN) 0.2 % ophthalmic solution Place 1 drop into both eyes 3 (three) times daily.     . Cholecalciferol (VITAMIN D) 125 MCG (5000 UT) CAPS Take 5,000 Units by mouth daily.     . ferrous sulfate 325 (65 FE) MG tablet Take 325 mg by mouth 2 (two) times daily with a  meal.    . fluticasone (FLONASE) 50 MCG/ACT nasal spray Place 1 spray into both nostrils at bedtime.    . furosemide (LASIX) 20 MG tablet Take 2 tablets (40 mg total) by mouth daily. 90 tablet 3  . latanoprost (XALATAN) 0.005 % ophthalmic solution Place 1 drop into the left eye at bedtime.  1  . levothyroxine (SYNTHROID) 25 MCG tablet Take 1 tablet (25 mcg total) by mouth daily before breakfast. 30 tablet 5  . loperamide (IMODIUM) 2 MG capsule Take 1 capsule (2 mg total) by mouth daily. 90 capsule 3  . losartan (COZAAR) 25 MG tablet Take 1 tablet (25 mg total) by mouth daily. 90 tablet 3  . mirabegron ER (MYRBETRIQ) 25 MG TB24 tablet Take 1 tablet (25 mg total) by mouth daily. Need office visit for more refills 90 tablet 0  . Multiple Vitamins-Minerals (PRESERVISION AREDS) CAPS Take 1 capsule by mouth 2 (two) times daily.     . Omega-3 Fatty Acids (FISH OIL) 435 MG CAPS Take 435 mg by mouth daily.    [name] omeprazole (PRILOSEC) 20 MG capsule Take 1 capsule (20 mg total) by mouth daily. 90 capsule 3  . potassium chloride SA (KLOR-CON) 20 MEQ tablet Dissolve 1 tablet daily in a small amount of water (may take 2 min to dissolve) then add to a small amount of apple sauce or pudding and eat right away 90 tablet 3  . predniSONE (DELTASONE) 5  MG tablet Take 20 mg by mouth daily with breakfast.    . pyridostigmine (MESTINON) 60 MG tablet Take 1 tablet at 8am and 3pm. 180 tablet 3  . rosuvastatin (CRESTOR) 10 MG tablet Take 1 tablet (10 mg total) by mouth daily. 90 tablet 3  . sertraline (ZOLOFT) 100 MG tablet TAKE 1 TABLET(100 MG) BY MOUTH AT BEDTIME 90 tablet 1   No current facility-administered medications on file prior to visit.    Allergies:  Allergies  Allergen Reactions  . Tramadol Other (See Comments)    delirium    Vital Signs:  BP 101/70   Pulse 89   Temp 97.8 F (36.6 C)   Wt 151 lb (68.5 kg)   SpO2 98%   BMI 27.62 kg/m   Neurological Exam: MENTAL STATUS including orientation to  time, place, person, recent and remote memory, attention span and concentration, language, and fund of knowledge is normal.  Speech is not dysarthric.  CRANIAL NERVES:   Pupils equal round and reactive to light.  Mild weakness with left eye lateral gaze, otherwise normal conjugate, extra-ocular eye movements in all directions of gaze.  Mild right ptosis (baseline, no worsening with sustained upgaze).  Face is symmetric. Orbicularis oculi and buccinator is 5/5.  Tongue strength 5/5.  MOTOR:  Motor strength is 5/5 in all extremities  COORDINATION/GAIT:   Gait not tested  Data: Labs 01/2012:+ ACh R antibody at .81 (low range of + >.3  IMPRESSION/PLAN: Seropositive ocular myasthenia gravis with exacerbation, diagnosed 2013.  Symptoms were very well controlled on prednisone 5mg  until August 2020, when she developed worsening ptosis and diplopia.  Her prednisone was slowly titrated to 30mg , but she did not have any benefit, so I tapered her back down given potential for unwanted steroid side effects.    - Continue prednisone 10mg  daily  - Trial off mestinon to simply medication regimen. If diplopia gets worse, resume 60mg  at 8am and 3pm  - With purely ocular symptoms and her advanced age/medical comorbidities, I do not plan to escalate her therapy to steroid-sparing agents and we mutually decided that the safest option may be to wear an eye patch, as needed.  If, during the summer, symptoms get worse, it would be reasonable to briefly increase prednisone to 20mg  daily  Telephone update in 2 months  Total time spent:  30 min  Thank you for allowing me to participate in patient's care.  If I can answer any additional questions, I would be pleased to do so.    Sincerely,    Donika K. Posey Pronto, DO

## 2019-12-31 NOTE — Patient Instructions (Signed)
Stay on prednisone 10mg  daily  Stop the mestinon.  If double vision gets worse, go back to taking mestinon 60mg  twice daily  Telephone update in 2 months

## 2020-01-03 ENCOUNTER — Other Ambulatory Visit: Payer: Self-pay

## 2020-01-03 ENCOUNTER — Encounter: Payer: Self-pay | Admitting: Addiction (Substance Use Disorder)

## 2020-01-03 ENCOUNTER — Ambulatory Visit (INDEPENDENT_AMBULATORY_CARE_PROVIDER_SITE_OTHER): Payer: Medicare Other | Admitting: Addiction (Substance Use Disorder)

## 2020-01-03 DIAGNOSIS — F331 Major depressive disorder, recurrent, moderate: Secondary | ICD-10-CM | POA: Diagnosis not present

## 2020-01-03 DIAGNOSIS — G4700 Insomnia, unspecified: Secondary | ICD-10-CM

## 2020-01-03 NOTE — Progress Notes (Signed)
[facility] Counsel[facility] Initial Adult Exam  Name: Tamara Gregory Date: 01/03/2020 MRN: [id number] DOB: 09-Sep-1925 PCP: Binnie Rail, MD  Time spent: 10:02-10:55am 53 mins  Reason for Visit /Presenting Problem: Client came in with her husband and introduced him as her nurse and giggled. She discussed her trouble with feeling depressed and her husband described the trouble she was having with her medication not helping that and also maybe making her insomnia worse. Client then talked about why she feels like aging is making her depressed and how she also feels like a burden to her husband who wants to go out and do things like work on the farm and play golf, but he has to take care of making her meals and changing her diaper. She talked about how dehumanizing it feels to be in diapers and taken care of like a baby is and looking forward to having a new body. Client thought about her frustrations and said:  "I used to be really active- like play tennis and bridge card games and now I cant do any of it. Im getting to old and I cant get out of the house, go to the beach or go out with my friends." She had hip surgery 10 months ago and has been doing better overall, but still cant walk. Her husband, who joined her session today, said: she cant get motivated to do her walking exercises since stopping her rehab program. Client reports only being able to walk with her walker, but just waring out for the day if she gets up to walk. Therapist building rapport with client and client expresses her grief with dealing with aging and the challenges that come with it. Client reported that now playing games, sleeping and eating yummy food makes her feel good. Client and husband said they dont know if talking about these things make her feel better and she cant hear the therapist because of the mask, so they arent wanting to come back, but will think about it if she wants to in the future.    Mental Status Exam:   Appearance:    Neat     Behavior:  Appropriate  Motor:  Restlestness  Speech/Language:   Slow  Affect:  Appropriate and Congruent  Mood:  anxious, depressed, dysthymic and sad  Thought process:  loose associations and tangential  Thought content:    Rumination  Sensory/Perceptual disturbances:    WNL  Orientation:  x4  Attention:  Good  Concentration:  Fair  Memory:  WNL  Fund of knowledge:   Fair  Insight:    Good  Judgment:   Good  Impulse Control:  Good   Reported Symptoms:  Hopleless, helpless, anhedonia, grief, missing quality of life.   Risk Assessment: Danger to Self:  No Self-injurious Behavior: No Danger to Others: No Duty to Warn:no Physical Aggression / Violence:No  Access to Firearms a concern: No  Gang Involvement:No  Patient / guardian was educated about steps to take if suicide or homicide risk level increases between visits: yes While future psychiatric events cannot be accurately predicted, the patient does not currently require acute inpatient psychiatric care and does not currently meet [facility] involuntary commitment criteria.  Substance Abuse History: Current substance abuse: No     Past Psychiatric History:   Previous psychological history is significant for depression Outpatient Providers:Dr Billey Gosling History of Psych Hospitalization: No  Psychological Testing: n/a   Abuse History: Victim of No., n/a   Report needed: No. Victim  of Neglect:No. Perpetrator of n/a  Witness / Exposure to Domestic Violence: No   Protective Services Involvement: No  Witness to [employer] Violence:  No   Family History:  Family History  Problem Relation Age of Onset  . Heart attack Father   . Diabetes Father   . Cancer Sister   . Breast cancer Sister   . Cancer Brother   . Lung cancer Brother   . Cancer Sister   . Liver cancer Sister   . Diabetes Sister   . Healthy Son   . Colon polyps Neg Hx   . Colon cancer Neg Hx   . Esophageal cancer Neg Hx   . Rectal  cancer Neg Hx   . Stomach cancer Neg Hx   . Adrenal disorder Neg Hx     Living situation: the patient lives with their spouse  Sexual Orientation:  Straight  Relationship Status: married  Name of spouse / other: Ray- 61 years of marriage              If a parent, number of children / ages: Gwyndolyn Saxon and Vicente Serene.   Support Systems; friends  Financial Stress:  No   Income/Employment/Disability: retired.  Military Service: No   Educational History: Education: high school diploma/GED  Religion/Sprituality/World View:   Protestant -[employer]  Any cultural differences that may affect / interfere with treatment:  not applicable   Recreation/Hobbies: reading & crossword/Sudoku puzzles  Stressors:Health problems- aging and not being able to do anything for herself anymore.  Strengths:  Family, Friends and Spirituality  Barriers:  health/ wheelchair bound mostly   Legal History: Pending legal issue / charges: The patient has no significant history of legal issues. History of legal issue / charges: n/a  Medical History/Surgical History:reviewed Past Medical History:  Diagnosis Date  . Anemia   . Anxiety   . Arthritis   . Cancer (HCC)    hx of skin cancer , colon   . Cataract    removed both eyes   . Chronic back pain    okay with sitting, increased back pain with activity   . Depression   . GERD (gastroesophageal reflux disease)   . Glaucoma   . Headache(784.0)    hx of  . History of kidney stones   . History of migraines   . Hyperlipidemia   . Hypertension   . Macular degeneration   . Myasthenia gravis ([place])   . Occasional tremors   . Pneumonia   . Spinal stenosis   . Thoracic aneurysm    4.4cm; see CT done 01/28/12 and 01/22/13 in [facility].   [name] Uses hearing aid   . Vitamin D deficiency     Past Surgical History:  Procedure Laterality Date  . ABDOMINAL HYSTERECTOMY    . CHOLECYSTECTOMY    . COLON SURGERY     Right colectomy dr. gross 03-17-18   . HIP ARTHROPLASTY Left 03/11/2019   Procedure: ARTHROPLASTY BIPOLAR HIP (HEMIARTHROPLASTY);  Surgeon: Renette Butters, MD;  Location: [facility];  Service: Orthopedics;  Laterality: Left;  . LUMBAR LAMINECTOMY/DECOMPRESSION MICRODISCECTOMY N/A 02/04/2013   Procedure: CENTRAL DECOMPRESSION/LUMBAR LAMINECTOMY L3-L4 AND L4-L5 2 LEVELS;  Surgeon: Tobi Bastos, MD;  Location: [facility];  Service: Orthopedics;  Laterality: N/A;  . SPINAL CORD STIMULATOR IMPLANT     in right hip but not currently using because it did not help  . TONSILLECTOMY    . UPPER GASTROINTESTINAL ENDOSCOPY    . US ECHOCARDIOGRAPHY  05/18/2007  EF 55-60%    Medications: Current Outpatient Medications  Medication Sig Dispense Refill  . acetaminophen (TYLENOL) 325 MG tablet Take 2 tablets (650 mg total) by mouth every 6 (six) hours as needed for headache.    [name] aspirin 81 MG chewable tablet Chew 0.5 tablets (40.5 mg total) by mouth daily. 30 tablet 0  . brimonidine (ALPHAGAN) 0.2 % ophthalmic solution Place 1 drop into both eyes 3 (three) times daily.     . Cholecalciferol (VITAMIN D) 125 MCG (5000 UT) CAPS Take 5,000 Units by mouth daily.     . ferrous sulfate 325 (65 FE) MG tablet Take 325 mg by mouth 2 (two) times daily with a meal.    . fluticasone (FLONASE) 50 MCG/ACT nasal spray Place 1 spray into both nostrils at bedtime.    . furosemide (LASIX) 20 MG tablet Take 2 tablets (40 mg total) by mouth daily. 90 tablet 3  . latanoprost (XALATAN) 0.005 % ophthalmic solution Place 1 drop into the left eye at bedtime.  1  . levothyroxine (SYNTHROID) 25 MCG tablet Take 1 tablet (25 mcg total) by mouth daily before breakfast. 30 tablet 5  . loperamide (IMODIUM) 2 MG capsule Take 1 capsule (2 mg total) by mouth daily. 90 capsule 3  . losartan (COZAAR) 25 MG tablet Take 1 tablet (25 mg total) by mouth daily. 90 tablet 3  . mirabegron ER (MYRBETRIQ) 25 MG TB24 tablet Take 1 tablet (25 mg total) by mouth daily. Need office visit for more  refills 90 tablet 0  . Multiple Vitamins-Minerals (PRESERVISION AREDS) CAPS Take 1 capsule by mouth 2 (two) times daily.     . Omega-3 Fatty Acids (FISH OIL) 435 MG CAPS Take 435 mg by mouth daily.    [name] omeprazole (PRILOSEC) 20 MG capsule Take 1 capsule (20 mg total) by mouth daily. 90 capsule 3  . potassium chloride SA (KLOR-CON) 20 MEQ tablet Dissolve 1 tablet daily in a small amount of water (may take 2 min to dissolve) then add to a small amount of apple sauce or pudding and eat right away 90 tablet 3  . predniSONE (DELTASONE) 5 MG tablet Take 10 mg by mouth daily with breakfast.    . pyridostigmine (MESTINON) 60 MG tablet Take 1 tablet at 8am and 3pm. 180 tablet 3  . rosuvastatin (CRESTOR) 10 MG tablet Take 1 tablet (10 mg total) by mouth daily. 90 tablet 3  . sertraline (ZOLOFT) 100 MG tablet TAKE 1 AND 1/2 TABLETS BY  MOUTH AT BEDTIME 135 tablet 3   No current facility-administered medications for this visit.    Allergies  Allergen Reactions  . Tramadol Other (See Comments)    delirium    Diagnoses:    ICD-10-CM   1. Major depressive disorder, recurrent episode, moderate (HCC)  F33.1   2. Insomnia, unspecified type  G47.00     Plan of Care:  Client is to return to therapy with therapist as needed to process pain/grief in a safe space.  Client is to continue seeing a medication provider to explore possible need for medication to help her find meaning in the moments of her life now.  Client is to practice mindfulness AEB daily meditation and body scans or as needed when flooded by emotion.  Client is to learn/practice DBT wise mind & radical acceptance AEB understanding they don't have to live in extremes and can feel both happy and sad emotions, and good and bad memories and allowing themselves to do so.  Client is to practice self-compassion AEB being gentle with themselves, utilizing self-care techniques daily or as needed when grieving something in the moment.  Client  participated in the treatment planning of their therapy. Client agreed with the plan and understands what to do if there is a crisis: call 9-1-1 and/or crisis line given by therapist.    Barnie Del, LCSW, LCAS, CCTP, CCS-I, BSP

## 2020-01-10 DIAGNOSIS — H401131 Primary open-angle glaucoma, bilateral, mild stage: Secondary | ICD-10-CM | POA: Diagnosis not present

## 2020-01-14 ENCOUNTER — Telehealth: Payer: Self-pay | Admitting: Hematology

## 2020-01-14 ENCOUNTER — Telehealth: Payer: Self-pay

## 2020-01-14 MED ORDER — LEVOTHYROXINE SODIUM 25 MCG PO TABS: 25.0000 ug | ORAL_TABLET | Freq: Every day | ORAL | 0 refills | Status: DC

## 2020-01-14 NOTE — Telephone Encounter (Signed)
Rescheduled pt appt per MD being on [place] with patient spouse and he is aware of the new appt date and time.

## 2020-01-14 NOTE — Telephone Encounter (Signed)
New message     1. Which medications need to be refilled? (please list name of each medication and dose if known) levothyroxine (SYNTHROID) 25 MCG tablet  2. Which pharmacy/location (including street and city if local pharmacy) is medication to be sent to?[facility]    3. Do they need a 30 day or 90 day supply? 90 day supply

## 2020-01-14 NOTE — Telephone Encounter (Signed)
Rx sent 

## 2020-01-18 DIAGNOSIS — C44622 Squamous cell carcinoma of skin of right upper limb, including shoulder: Secondary | ICD-10-CM | POA: Diagnosis not present

## 2020-01-18 DIAGNOSIS — C4442 Squamous cell carcinoma of skin of scalp and neck: Secondary | ICD-10-CM | POA: Diagnosis not present

## 2020-01-24 ENCOUNTER — Ambulatory Visit (INDEPENDENT_AMBULATORY_CARE_PROVIDER_SITE_OTHER): Payer: Medicare Other | Admitting: Internal Medicine

## 2020-01-24 ENCOUNTER — Ambulatory Visit (INDEPENDENT_AMBULATORY_CARE_PROVIDER_SITE_OTHER): Payer: Medicare Other

## 2020-01-24 ENCOUNTER — Other Ambulatory Visit: Payer: Self-pay

## 2020-01-24 ENCOUNTER — Encounter: Payer: Self-pay | Admitting: Internal Medicine

## 2020-01-24 VITALS — BP 128/82 | HR 91 | Temp 98.4°F | Resp 16

## 2020-01-24 DIAGNOSIS — M25512 Pain in left shoulder: Secondary | ICD-10-CM

## 2020-01-24 DIAGNOSIS — R0781 Pleurodynia: Secondary | ICD-10-CM

## 2020-01-24 DIAGNOSIS — S299XXA Unspecified injury of thorax, initial encounter: Secondary | ICD-10-CM | POA: Diagnosis not present

## 2020-01-24 DIAGNOSIS — M85852 Other specified disorders of bone density and structure, left thigh: Secondary | ICD-10-CM

## 2020-01-24 DIAGNOSIS — S4992XA Unspecified injury of left shoulder and upper arm, initial encounter: Secondary | ICD-10-CM | POA: Diagnosis not present

## 2020-01-24 NOTE — Assessment & Plan Note (Signed)
Acute Related to fall last night Pain in range of motion much improved compared to last night Continue Tylenol as needed X-ray today to rule out fracture, which I do not think she has a fracture Call if no improvement

## 2020-01-24 NOTE — Assessment & Plan Note (Signed)
Acute From fall last night Pain has improved significantly Did require stronger pain medication last night, but has only taken Tylenol today Increased pain with movement, deep breaths Unlikely that she has a fracture, but will check an x-ray today Continue Tylenol as needed.  Advised to call if pain is not controlled

## 2020-01-24 NOTE — Assessment & Plan Note (Signed)
Severe osteopenia and history of fracture so she will qualify for medication for her bones Due for DEXA-ordered They are interested in treatment and would prefer Prolia overall weekly pill so we will look into that once we receive her bone density results Continue calcium and vitamin D She is very physically deconditioned and has had several falls.  She has done physical therapy in the past, which unfortunately only helped short-term

## 2020-01-24 NOTE — Progress Notes (Signed)
Subjective:    Patient ID: Tamara Gregory, female    DOB: 08-Feb-1925, 86 y.o.   MRN: [id number]  HPI The patient is here for an acute visit.  She is here with her husband who helps supplement the history.   She fell yesterday and has left sided arm and rib pain.  She states she was walking around the bed and either ran into the bed or tripped and fell onto her left side.  She did hit her head, but not very hard.  She is no worse headaches or dizziness than usual.  She denies any loss of consciousness.  Since the fall she has had pain in her left rib region-anterior and lateral and her left shoulder.  Last night the pain was significant and any movement hurt.  Her husband ended up giving her an oxycodone that she had leftover from by her surgery.  Since then she has only been taking Tylenol.  Her pain today is much better.  She is able to move her arm more.  She also scraped her leg, but no other major injuries.   She is taking her calcium and vitamin D daily.  Her husband asked about her last bone density test.    Medications and allergies reviewed with patient and updated if appropriate.  Patient Active Problem List   Diagnosis Date Noted  . Abdominal pain 12/08/2019  . Decreased mobility and endurance 11/15/2019  . Iron deficiency anemia due to chronic blood loss 08/29/2019  . CAD (coronary artery disease) 08/17/2019  . invasive SCC (squamous cell carcinoma), leg, left 07/31/2019  . Hypothyroidism 07/01/2019  . Closed fracture of left hip ([place]) 06/30/2019  . Hair loss 06/29/2019  . Acute on chronic systolic CHF (congestive heart failure) ([place]) 05/14/2019  . Sleep disturbance 04/29/2019  . Tachycardia   . Steroid-induced hyperglycemia   . Labile blood pressure   . Leukocytosis   . E. coli UTI   . Hypoalbuminemia due to protein-calorie malnutrition ([place])   . Ocular myasthenia gravis ([place])   . Anemia   . Hip fracture ([place]) 03/10/2019  . Left displaced femoral neck fracture  ([place]) 03/10/2019  . Weakness 12/04/2018  . Hypokalemia 03/18/2018  . 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 Right ovarian cyst s/p RSO 03/17/2018  . Diarrhea 12/23/2017  . Large hiatal hernia 04/01/2017  . Sweating profusely 03/19/2017  . Hyperglycemia 03/19/2017  . Squamous cell skin cancer 11/05/2016  . Nosebleed 10/19/2016  . Osteopenia 08/28/2016  . Essential hypertension, benign 08/21/2016  . Long term current use of systemic steroids 08/21/2016  . Primary open angle glaucoma of both eyes, mild stage 05/14/2016  . Ocular hypertension of left eye 05/08/2016  . GERD (gastroesophageal reflux disease) 05/06/2016  . Macular pucker, bilateral 11/14/2015  . Status post intraocular lens implant 04/04/2015  . Exudative age-related macular degeneration ([place]) 04/04/2015  . Chronic pain syndrome 02/07/2015  . Bilateral nonexudative age-related macular degeneration 01/16/2015  . Presence of intraocular lens 01/16/2015  . Insomnia 07/26/2014  . Urge incontinence of urine 01/17/2014  . Urinary urgency 01/17/2014  . Malaise and fatigue 10/04/2013  . PAC (premature atrial contraction) 10/04/2013  . Depression 02/15/2013  . Myasthenia gravis ([place]) 03/17/2012  . Spinal stenosis, lumbar 01/14/2012  . Ptosis of eyelid 01/14/2012  . Pure hypercholesterolemia 09/05/2011  . Benign hypertensive heart disease without heart failure 09/05/2011  . Osteoarthritis 09/05/2011    Current Outpatient Medications on File Prior to Visit  Medication Sig Dispense Refill  . acetaminophen (TYLENOL) 325 MG tablet Take 2 tablets (650 mg total) by mouth every 6 (six) hours as needed for headache.    [name] aspirin 81 MG chewable tablet Chew 0.5 tablets (40.5 mg total) by mouth daily. 30 tablet 0  . brimonidine (ALPHAGAN) 0.2 % ophthalmic solution Place 1 drop into both eyes 3 (three) times daily.     . Cholecalciferol (VITAMIN D) 125 MCG (5000 UT) CAPS Take 5,000 Units by mouth daily.       . ferrous sulfate 325 (65 FE) MG tablet Take 325 mg by mouth 2 (two) times daily with a meal.    . fluticasone (FLONASE) 50 MCG/ACT nasal spray Place 1 spray into both nostrils at bedtime.    . furosemide (LASIX) 20 MG tablet Take 2 tablets (40 mg total) by mouth daily. 90 tablet 3  . latanoprost (XALATAN) 0.005 % ophthalmic solution Place 1 drop into the left eye at bedtime.  1  . levothyroxine (SYNTHROID) 25 MCG tablet Take 1 tablet (25 mcg total) by mouth daily before breakfast. Need office visit for more refills. 90 tablet 0  . loperamide (IMODIUM) 2 MG capsule Take 1 capsule (2 mg total) by mouth daily. 90 capsule 3  . losartan (COZAAR) 25 MG tablet Take 1 tablet (25 mg total) by mouth daily. 90 tablet 3  . mirabegron ER (MYRBETRIQ) 25 MG TB24 tablet Take 1 tablet (25 mg total) by mouth daily. Need office visit for more refills 90 tablet 0  . Multiple Vitamins-Minerals (PRESERVISION AREDS) CAPS Take 1 capsule by mouth 2 (two) times daily.     . Omega-3 Fatty Acids (FISH OIL) 435 MG CAPS Take 435 mg by mouth daily.    [name] omeprazole (PRILOSEC) 20 MG capsule Take 1 capsule (20 mg total) by mouth daily. 90 capsule 3  . potassium chloride SA (KLOR-CON) 20 MEQ tablet Dissolve 1 tablet daily in a small amount of water (may take 2 min to dissolve) then add to a small amount of apple sauce or pudding and eat right away 90 tablet 3  . predniSONE (DELTASONE) 5 MG tablet Take 10 mg by mouth daily with breakfast.    . pyridostigmine (MESTINON) 60 MG tablet Take 1 tablet at 8am and 3pm. 180 tablet 3  . rosuvastatin (CRESTOR) 10 MG tablet Take 1 tablet (10 mg total) by mouth daily. 90 tablet 3  . sertraline (ZOLOFT) 100 MG tablet TAKE 1 AND 1/2 TABLETS BY  MOUTH AT BEDTIME 135 tablet 3   No current facility-administered medications on file prior to visit.    Past Medical History:  Diagnosis Date  . Anemia   . Anxiety   . Arthritis   . Cancer (HCC)    hx of skin cancer , colon   . Cataract     removed both eyes   . Chronic back pain    okay with sitting, increased back pain with activity   . Depression   . GERD (gastroesophageal reflux disease)   . Glaucoma   . Headache(784.0)    hx of  . History of kidney stones   . History of migraines   . Hyperlipidemia   . Hypertension   . Macular degeneration   . Myasthenia gravis ([place])   . Occasional tremors   . Pneumonia   . Spinal stenosis   . Thoracic aneurysm    4.4cm; see CT done 01/28/12 and 01/22/13 in [facility].   [name] Uses hearing aid   .  Vitamin D deficiency     Past Surgical History:  Procedure Laterality Date  . ABDOMINAL HYSTERECTOMY    . CHOLECYSTECTOMY    . COLON SURGERY     Right colectomy dr. gross 03-17-18  . HIP ARTHROPLASTY Left 03/11/2019   Procedure: ARTHROPLASTY BIPOLAR HIP (HEMIARTHROPLASTY);  Surgeon: Renette Butters, MD;  Location: [facility];  Service: Orthopedics;  Laterality: Left;  . LUMBAR LAMINECTOMY/DECOMPRESSION MICRODISCECTOMY N/A 02/04/2013   Procedure: CENTRAL DECOMPRESSION/LUMBAR LAMINECTOMY L3-L4 AND L4-L5 2 LEVELS;  Surgeon: Tobi Bastos, MD;  Location: [facility];  Service: Orthopedics;  Laterality: N/A;  . SPINAL CORD STIMULATOR IMPLANT     in right hip but not currently using because it did not help  . TONSILLECTOMY    . UPPER GASTROINTESTINAL ENDOSCOPY    . US ECHOCARDIOGRAPHY  05/18/2007   EF 55-60%    Social History   Socioeconomic History  . Marital status: Married    Spouse name: Not on file  . Number of children: 3  . Years of education: Not on file  . Highest education level: High school graduate  Occupational History  . Occupation: retired  Tobacco Use  . Smoking status: Never Smoker  . Smokeless tobacco: Never Used  Substance and Sexual Activity  . Alcohol use: No  . Drug use: No  . Sexual activity: Not Currently  Other Topics Concern  . Not on file  Social History Narrative   Lives with husband in a one story home.  Has 2 sons.  Retired.  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  Education: college.  [employer].       Right handed    Social Determinants of Health   Financial Resource Strain: Low Risk   . Difficulty of Paying Living Expenses: Not hard at all  Food Insecurity: No Food Insecurity  . Worried About [name] fundraiser in the Last Year: Never true  . Ran Out of Food in the Last Year: Never true  Transportation Needs: No Transportation Needs  . Lack of Transportation (Medical): No  . Lack of Transportation (Non-Medical): No  Physical Activity: Inactive  . Days of Exercise per Week: 0 days  . Minutes of Exercise per Session: 0 min  Stress: Stress Concern Present  . Feeling of Stress : Rather much  Social Connections: Not Isolated  . Frequency of Communication with Friends and Family: More than three times a week  . Frequency of Social Gatherings with Friends and Family: Once a week  . Attends Religious Services: 1 to 4 times per year  . Active Member of Clubs or Organizations: Yes  . Attends Archivist Meetings: More than 4 times per year  . Marital Status: Married    Family History  Problem Relation Age of Onset  . Heart attack Father   . Diabetes Father   . Cancer Sister   . Breast cancer Sister   . Cancer Brother   . Lung cancer Brother   . Cancer Sister   . Liver cancer Sister   . Diabetes Sister   . Healthy Son   . Colon polyps Neg Hx   . Colon cancer Neg Hx   . Esophageal cancer Neg Hx   . Rectal cancer Neg Hx   . Stomach cancer Neg Hx   . Adrenal disorder Neg Hx     Review of Systems  Constitutional: Negative for fever.  Respiratory: Negative for cough, shortness of breath and wheezing.  Cardiovascular: Negative for chest pain.  Musculoskeletal: Positive for arthralgias.       Left-sided rib pain, left shoulder pain  Skin: Negative for color change (No bruising).  Neurological: Positive for dizziness (Chronic) and headaches (Chronic).       Objective:   Vitals:   01/24/20  1524  BP: 128/82  Pulse: 91  Resp: 16  Temp: 98.4 F (36.9 C)  SpO2: 94%   BP Readings from Last 3 Encounters:  01/24/20 128/82  12/31/19 101/70  12/08/19 116/72   Wt Readings from Last 3 Encounters:  12/31/19 151 lb (68.5 kg)  11/17/19 148 lb 12.8 oz (67.5 kg)  11/16/19 145 lb (65.8 kg)   There is no height or weight on file to calculate BMI.   Physical Exam Constitutional:      General: She is not in acute distress.    Appearance: Normal appearance. She is not ill-appearing.  HENT:     Head: Normocephalic and atraumatic.  Cardiovascular:     Rate and Rhythm: Normal rate and regular rhythm.  Pulmonary:     Effort: Pulmonary effort is normal. No respiratory distress.     Breath sounds: Normal breath sounds. No wheezing or rales.  Chest:     Chest wall: Tenderness present.  Musculoskeletal:        General: Tenderness (Left lateral and anterior lower ribs without obvious defect.  Slightly increased pain with deep breaths.  Left proximal upper arm tenderness with palpation, slightly decreased range of motion, which she states is much improved.  No defect) present. No swelling or deformity.  Skin:    General: Skin is warm and dry.     Findings: No bruising.  Neurological:     Mental Status: She is alert.            Assessment & Plan:    See Problem List for Assessment and Plan of chronic medical problems.    This visit occurred during the SARS-CoV-2 public health emergency.  Safety protocols were in place, including screening questions prior to the visit, additional usage of staff PPE, and extensive cleaning of exam room while observing appropriate contact time as indicated for disinfecting solutions.

## 2020-01-24 NOTE — Patient Instructions (Signed)
Go downstairs for x-rays of your ribs and shoulder.  We will call you with the results.  Continue tylenol for your pain.    Schedule your bone density at our [place] office.

## 2020-01-25 ENCOUNTER — Ambulatory Visit: Payer: Medicare Other | Admitting: Adult Health

## 2020-01-25 ENCOUNTER — Telehealth: Payer: Self-pay | Admitting: Internal Medicine

## 2020-01-25 NOTE — Telephone Encounter (Signed)
Insurance has been submitted and verified for Prolia. Patient is responsible for a $25 copay.  I see that Dr Quay Burow is waiting on the Bone Density to confirm which treatment would be best. If she would like Prolia, she is okay to go ahead and schedule.

## 2020-01-25 NOTE — Telephone Encounter (Signed)
FYI

## 2020-01-26 ENCOUNTER — Ambulatory Visit (INDEPENDENT_AMBULATORY_CARE_PROVIDER_SITE_OTHER)
Admission: RE | Admit: 2020-01-26 | Discharge: 2020-01-26 | Disposition: A | Discharge status: Home / Self Care | Payer: Medicare Other | Source: Ambulatory Visit | Attending: Internal Medicine | Admitting: Internal Medicine

## 2020-01-26 ENCOUNTER — Other Ambulatory Visit: Payer: Self-pay

## 2020-01-26 DIAGNOSIS — M85852 Other specified disorders of bone density and structure, left thigh: Secondary | ICD-10-CM | POA: Diagnosis not present

## 2020-01-27 ENCOUNTER — Encounter: Payer: Self-pay | Admitting: Internal Medicine

## 2020-01-27 NOTE — Telephone Encounter (Signed)
Her recent bone density scan shows that she has low bone density and she is eligible for prolia - I stronger recommend she start this -- please share info below and set up injection is she is willing.

## 2020-01-28 NOTE — Telephone Encounter (Signed)
Tried calling in regards. No answer. Will try back later.

## 2020-01-28 NOTE — Telephone Encounter (Signed)
Appointment made

## 2020-01-28 NOTE — Telephone Encounter (Signed)
Husband will call back to schedule prolia injection appointment.

## 2020-01-31 ENCOUNTER — Ambulatory Visit: Payer: Medicare Other

## 2020-02-01 ENCOUNTER — Ambulatory Visit: Payer: Medicare Other | Admitting: Adult Health

## 2020-02-01 DIAGNOSIS — H35373 Puckering of macula, bilateral: Secondary | ICD-10-CM | POA: Diagnosis not present

## 2020-02-01 DIAGNOSIS — H353130 Nonexudative age-related macular degeneration, bilateral, stage unspecified: Secondary | ICD-10-CM | POA: Diagnosis not present

## 2020-02-01 DIAGNOSIS — H401131 Primary open-angle glaucoma, bilateral, mild stage: Secondary | ICD-10-CM | POA: Diagnosis not present

## 2020-02-01 DIAGNOSIS — Z961 Presence of intraocular lens: Secondary | ICD-10-CM | POA: Diagnosis not present

## 2020-02-02 ENCOUNTER — Telehealth: Payer: Self-pay

## 2020-02-02 ENCOUNTER — Telehealth: Payer: Self-pay | Admitting: Cardiovascular Disease

## 2020-02-02 DIAGNOSIS — C44722 Squamous cell carcinoma of skin of right lower limb, including hip: Secondary | ICD-10-CM | POA: Diagnosis not present

## 2020-02-02 NOTE — Telephone Encounter (Signed)
Patient's husband is returning call. 

## 2020-02-02 NOTE — Telephone Encounter (Signed)
New Message    Pts husband is calling and is wanting Dr Elmarie Shiley nurse to call him     Please call back

## 2020-02-02 NOTE — Telephone Encounter (Signed)
Returned call to Pt's husband.  Confirmed Pt is currently taking 40 mg lasix daily (with potassium).  Advised husband of Dr. Elmarie Shiley recommendations (see note).  Advised to call office for appt if no improvement after 3 days.  Pt's husband repeated orders back to this nurse.  Thanked nurse for call.

## 2020-02-02 NOTE — Telephone Encounter (Signed)
error 

## 2020-02-02 NOTE — Telephone Encounter (Signed)
Spoke with [name] about his wife who has been increasingly SOB. He states she is more "puffy" in her face, neck and stomach. Pt does not weigh herself so unable to determine how many pounds she has gained. Spouse reports decreases appetite.   She has an appt with PCP on 3/5 but [name] says that he has experienced similar symptoms in the past and Dr. Acie Fredrickson prescribed a diuretic so he is wondering if he could do the same for Tamara Gregory.   Offered patient open OV with [facility] tomorrow afternoon but patient is unable to make it d/t conflicting appt in [place]. Will route to [employer] for advisement.

## 2020-02-02 NOTE — Telephone Encounter (Signed)
According to my last note in Dec. She should be on Lasix 40 mg a day and Kdur 20 meq a day . Is she still taking this ?    If she is , she may double the lasix ( 40 mg BID and Kdur 20 meq bid for 3 days to see if it helps the swelling )  If this persists, should should make an appt to see [place] soon   Mertie Moores, MD  02/02/2020 12:43 PM    [facility] [address]  [place], [place]  [address] Phone: [phone]; Fax: [phone]

## 2020-02-03 ENCOUNTER — Encounter: Payer: Self-pay | Admitting: Adult Health

## 2020-02-03 ENCOUNTER — Ambulatory Visit (INDEPENDENT_AMBULATORY_CARE_PROVIDER_SITE_OTHER): Payer: Medicare Other | Admitting: Adult Health

## 2020-02-03 ENCOUNTER — Other Ambulatory Visit: Payer: Self-pay

## 2020-02-03 DIAGNOSIS — G7000 Myasthenia gravis without (acute) exacerbation: Secondary | ICD-10-CM | POA: Diagnosis not present

## 2020-02-03 DIAGNOSIS — H353130 Nonexudative age-related macular degeneration, bilateral, stage unspecified: Secondary | ICD-10-CM | POA: Diagnosis not present

## 2020-02-03 DIAGNOSIS — H401131 Primary open-angle glaucoma, bilateral, mild stage: Secondary | ICD-10-CM | POA: Diagnosis not present

## 2020-02-03 DIAGNOSIS — G4700 Insomnia, unspecified: Secondary | ICD-10-CM | POA: Diagnosis not present

## 2020-02-03 DIAGNOSIS — Z9842 Cataract extraction status, left eye: Secondary | ICD-10-CM | POA: Diagnosis not present

## 2020-02-03 DIAGNOSIS — F411 Generalized anxiety disorder: Secondary | ICD-10-CM

## 2020-02-03 DIAGNOSIS — Z9841 Cataract extraction status, right eye: Secondary | ICD-10-CM | POA: Diagnosis not present

## 2020-02-03 DIAGNOSIS — F331 Major depressive disorder, recurrent, moderate: Secondary | ICD-10-CM | POA: Diagnosis not present

## 2020-02-03 DIAGNOSIS — Z961 Presence of intraocular lens: Secondary | ICD-10-CM | POA: Diagnosis not present

## 2020-02-03 DIAGNOSIS — H5021 Vertical strabismus, right eye: Secondary | ICD-10-CM | POA: Diagnosis not present

## 2020-02-03 DIAGNOSIS — H02403 Unspecified ptosis of bilateral eyelids: Secondary | ICD-10-CM | POA: Diagnosis not present

## 2020-02-03 MED ORDER — MIRTAZAPINE 7.5 MG PO TABS: 7.5000 mg | ORAL_TABLET | Freq: Every day | ORAL | 1 refills | Status: DC

## 2020-02-03 NOTE — Progress Notes (Signed)
Tamara Gregory [id number] Oct 13, 1925 86 y.o.  Subjective:   Patient ID:  Tamara Gregory is a 86 y.o. (DOB [birth year]) female.  Chief Complaint:  Chief Complaint  Patient presents with  . Depression  . Anxiety  . Insomnia    HPI   Jacoby B Fixler presents to the office today for follow-up of insomnia, anxiety, and depression.   Accompanied by husband  Describes mood today as "ok". Pleasant. Mood symptoms - reports depression, anxiety, and irritability. Stating "some days are better than others". Doing some activities - recent beach trip. Likes to do puzzles when she can. Saw therapist once - "we had a good chat". Varying interest and motivation. Feels like she would like to sleep and things would be "better". Taking Tylenol PM currently. Taking medications as prescribed.  Energy levels low - "no energy". Recovering from a broken hip a year ago. Using walker some at home. Active, does not have a regular exercise routine.  Enjoys some usual interests and activities. Married. Lives with husband of 23 years.  Appetite decreased - nauseas. Drinking ensures. Weight stable. Sleeps better some nights than others. Averages 5 to 6 hours of sleep before waking up and not being able to get back to sleep. Napping during the day. Focus and concentration "slipping". Completing tasks. Managing aspects of household. Housewife.  Denies SI or HI. Denies AH or VH.  Previous medication trials: Celexa, Zoloft, Temazepam, Trazadone-halluciantions  PHQ2-9     Clinical Support from 01/27/2019 in [place] Office Visit from 11/19/2017 in [place] Visit from 03/19/2017 in [place] Visit from 05/06/2016 in [place]  PHQ-2 Total Score  6  5  5   0  PHQ-9 Total Score  16  12  10   -      Review of Systems:  Review of Systems  Musculoskeletal: Negative for gait problem.  Neurological:  Negative for tremors.  Psychiatric/Behavioral:       Please refer to HPI    Medications: I have reviewed the patient's current medications.  Current Outpatient Medications  Medication Sig Dispense Refill  . acetaminophen (TYLENOL) 325 MG tablet Take 2 tablets (650 mg total) by mouth every 6 (six) hours as needed for headache.    [name] aspirin 81 MG chewable tablet Chew 0.5 tablets (40.5 mg total) by mouth daily. 30 tablet 0  . brimonidine (ALPHAGAN) 0.2 % ophthalmic solution Place 1 drop into both eyes 3 (three) times daily.     . Cholecalciferol (VITAMIN D) 125 MCG (5000 UT) CAPS Take 5,000 Units by mouth daily.     . ferrous sulfate 325 (65 FE) MG tablet Take 325 mg by mouth 2 (two) times daily with a meal.    . fluticasone (FLONASE) 50 MCG/ACT nasal spray Place 1 spray into both nostrils at bedtime.    . furosemide (LASIX) 20 MG tablet Take 2 tablets (40 mg total) by mouth daily. 90 tablet 3  . latanoprost (XALATAN) 0.005 % ophthalmic solution Place 1 drop into the left eye at bedtime.  1  . levothyroxine (SYNTHROID) 25 MCG tablet Take 1 tablet (25 mcg total) by mouth daily before breakfast. Need office visit for more refills. 90 tablet 0  . loperamide (IMODIUM) 2 MG capsule Take 1 capsule (2 mg total) by mouth daily. 90 capsule 3  . losartan (COZAAR) 25 MG tablet Take 1 tablet (25 mg total) by mouth daily. 90 tablet 3  .  mirabegron ER (MYRBETRIQ) 25 MG TB24 tablet Take 1 tablet (25 mg total) by mouth daily. Need office visit for more refills 90 tablet 0  . Multiple Vitamins-Minerals (PRESERVISION AREDS) CAPS Take 1 capsule by mouth 2 (two) times daily.     . Omega-3 Fatty Acids (FISH OIL) 435 MG CAPS Take 435 mg by mouth daily.    [name] omeprazole (PRILOSEC) 20 MG capsule Take 1 capsule (20 mg total) by mouth daily. 90 capsule 3  . potassium chloride SA (KLOR-CON) 20 MEQ tablet Dissolve 1 tablet daily in a small amount of water (may take 2 min to dissolve) then add to a small amount of apple  sauce or pudding and eat right away 90 tablet 3  . predniSONE (DELTASONE) 5 MG tablet Take 10 mg by mouth daily with breakfast.    . pyridostigmine (MESTINON) 60 MG tablet Take 1 tablet at 8am and 3pm. 180 tablet 3  . rosuvastatin (CRESTOR) 10 MG tablet Take 1 tablet (10 mg total) by mouth daily. 90 tablet 3  . sertraline (ZOLOFT) 100 MG tablet TAKE 1 AND 1/2 TABLETS BY  MOUTH AT BEDTIME 135 tablet 3   No current facility-administered medications for this visit.    Medication Side Effects: None  Allergies:  Allergies  Allergen Reactions  . Tramadol Other (See Comments)    delirium    Past Medical History:  Diagnosis Date  . Anemia   . Anxiety   . Arthritis   . Cancer (HCC)    hx of skin cancer , colon   . Cataract    removed both eyes   . Chronic back pain    okay with sitting, increased back pain with activity   . Depression   . GERD (gastroesophageal reflux disease)   . Glaucoma   . Headache(784.0)    hx of  . History of kidney stones   . History of migraines   . Hyperlipidemia   . Hypertension   . Macular degeneration   . Myasthenia gravis ([place])   . Occasional tremors   . Pneumonia   . Spinal stenosis   . Thoracic aneurysm    4.4cm; see CT done 01/28/12 and 01/22/13 in [facility].   [name] Uses hearing aid   . Vitamin D deficiency     Family History  Problem Relation Age of Onset  . Heart attack Father   . Diabetes Father   . Cancer Sister   . Breast cancer Sister   . Cancer Brother   . Lung cancer Brother   . Cancer Sister   . Liver cancer Sister   . Diabetes Sister   . Healthy Son   . Colon polyps Neg Hx   . Colon cancer Neg Hx   . Esophageal cancer Neg Hx   . Rectal cancer Neg Hx   . Stomach cancer Neg Hx   . Adrenal disorder Neg Hx     Social History   Socioeconomic History  . Marital status: Married    Spouse name: Not on file  . Number of children: 3  . Years of education: Not on file  . Highest education level: High school graduate   Occupational History  . Occupation: retired  Tobacco Use  . Smoking status: Never Smoker  . Smokeless tobacco: Never Used  Substance and Sexual Activity  . Alcohol use: No  . Drug use: No  . Sexual activity: Not Currently  Other Topics Concern  . Not on file  Social History Narrative  Lives with husband in a one story home.  Has 2 sons.  Retired.  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  Education: college.  [employer].       Right handed    Social Determinants of Health   Financial Resource Strain:   . Difficulty of Paying Living Expenses: Not on file  Food Insecurity:   . Worried About [name] fundraiser in the Last Year: Not on file  . Ran Out of Food in the Last Year: Not on file  Transportation Needs:   . Lack of Transportation (Medical): Not on file  . Lack of Transportation (Non-Medical): Not on file  Physical Activity:   . Days of Exercise per Week: Not on file  . Minutes of Exercise per Session: Not on file  Stress:   . Feeling of Stress : Not on file  Social Connections:   . Frequency of Communication with Friends and Family: Not on file  . Frequency of Social Gatherings with Friends and Family: Not on file  . Attends Religious Services: Not on file  . Active Member of Clubs or Organizations: Not on file  . Attends Archivist Meetings: Not on file  . Marital Status: Not on file  Intimate Partner Violence:   . Fear of Current or Ex-Partner: Not on file  . Emotionally Abused: Not on file  . Physically Abused: Not on file  . Sexually Abused: Not on file    Past Medical History, Surgical history, Social history, and Family history were reviewed and updated as appropriate.   Please see review of systems for further details on the patient's review from today.   Objective:   Physical Exam:  There were no vitals taken for this visit.  Physical Exam Constitutional:      General: She is not in acute  distress. Musculoskeletal:        General: No deformity.  Neurological:     Mental Status: She is alert and oriented to person, place, and time.     Coordination: Coordination normal.  Psychiatric:        Attention and Perception: Attention and perception normal. She does not perceive auditory or visual hallucinations.        Mood and Affect: Mood normal. Mood is not anxious or depressed. Affect is not labile, blunt, angry or inappropriate.        Speech: Speech normal.        Behavior: Behavior normal.        Thought Content: Thought content normal. Thought content is not paranoid or delusional. Thought content does not include homicidal or suicidal ideation. Thought content does not include homicidal or suicidal plan.        Cognition and Memory: Cognition and memory normal.        Judgment: Judgment normal.     Comments: Insight intact     Lab Review:     Component Value Date/Time   NA 143 11/17/2019 1444   K 4.6 11/17/2019 1444   CL 100 11/17/2019 1444   CO2 23 11/17/2019 1444   GLUCOSE 247 (H) 11/17/2019 1444   GLUCOSE 124 (H) 07/20/2019 1214   BUN 30 11/17/2019 1444   CREATININE 0.92 11/17/2019 1444   CREATININE 0.77 07/20/2019 1214   CREATININE 0.96 (H) 02/28/2016 0918   CALCIUM 10.1 11/17/2019 1444   PROT 6.4 11/17/2019 1444   ALBUMIN 4.5 11/17/2019 1444   AST 23 11/17/2019 1444   AST 22 07/20/2019  1214   ALT 25 11/17/2019 1444   ALT 17 07/20/2019 1214   ALKPHOS 64 11/17/2019 1444   BILITOT 0.4 11/17/2019 1444   BILITOT 0.4 07/20/2019 1214   GFRNONAA 54 (L) 11/17/2019 1444   GFRNONAA >60 07/20/2019 1214   GFRAA 63 11/17/2019 1444   GFRAA >60 07/20/2019 1214       Component Value Date/Time   WBC 9.6 08/30/2019 1047   WBC 7.0 08/16/2019 1529   RBC 4.05 08/30/2019 1048   RBC 4.04 08/30/2019 1047   HGB 12.3 08/30/2019 1047   HGB 9.8 (L) 04/30/2019 1701   HCT 39.3 08/30/2019 1047   HCT 31.7 (L) 04/30/2019 1701   PLT 207 08/30/2019 1047   PLT 276 04/30/2019  1701   MCV 97.3 08/30/2019 1047   MCV 88 04/30/2019 1701   MCH 30.4 08/30/2019 1047   MCHC 31.3 08/30/2019 1047   RDW 18.0 (H) 08/30/2019 1047   RDW 16.0 (H) 04/30/2019 1701   LYMPHSABS 0.6 (L) 08/30/2019 1047   MONOABS 1.0 08/30/2019 1047   EOSABS 0.0 08/30/2019 1047   BASOSABS 0.0 08/30/2019 1047    No results found for: POCLITH, LITHIUM   No results found for: PHENYTOIN, PHENOBARB, VALPROATE, CBMZ   .res Assessment: Plan:    Plan:  PDMP reviewed  1. Decrease Zoloft 150mg  to 100mg  daily. Plan to continue to taper off medication as tolerated. 2. Add Remeron 7.5mg  at hs for mood, sleep, and appetite.   Saw therapist.   RTC 4 weeks phone call  Patient advised to contact office with any questions, adverse effects, or acute worsening in signs and symptoms.  Emmilia was seen today for depression, anxiety and insomnia.  Diagnoses and all orders for this visit:  Insomnia, unspecified type  Major depressive disorder, recurrent episode, moderate ([place])  Generalized anxiety disorder     Please see After Visit Summary for patient specific instructions.  Future Appointments  Date Time Provider [place]  02/04/2020  9:20 AM [facility] NURSE [facility] None  02/14/2020  3:20 PM Jamse Arn, MD [facility] CPR  03/02/2020  8:50 AM Narda Amber K, DO [facility] None  03/02/2020  3:15 PM [place] ADVISOR [facility] None  03/06/2020  3:00 PM Rayburn Mundis, Berdie Ogren, NP [facility] None  03/09/2020  1:45 PM [facility] LAB 3 [facility] None  03/09/2020  2:20 PM Truitt Merle, MD [facility] None  06/13/2020  1:30 PM Binnie Rail, MD [facility] None    No orders of the defined types were placed in this encounter.   -------------------------------

## 2020-02-04 ENCOUNTER — Ambulatory Visit (INDEPENDENT_AMBULATORY_CARE_PROVIDER_SITE_OTHER): Payer: Medicare Other | Admitting: *Deleted

## 2020-02-04 ENCOUNTER — Telehealth: Payer: Self-pay | Admitting: Internal Medicine

## 2020-02-04 DIAGNOSIS — M810 Age-related osteoporosis without current pathological fracture: Secondary | ICD-10-CM | POA: Diagnosis not present

## 2020-02-04 MED ORDER — DENOSUMAB 60 MG/ML ~~LOC~~ SOSY
60.0000 mg | PREFILLED_SYRINGE | Freq: Once | SUBCUTANEOUS | Status: AC
  Administered 2020-02-04: 60 mg via SUBCUTANEOUS

## 2020-02-04 NOTE — Telephone Encounter (Signed)
New message:   [name] is calling from [facility] to see if we have received a fax on this patient pertaining to him coming to live at their facility. She stated she faxed it on 02/01/20. Please advise. 

## 2020-02-04 NOTE — Progress Notes (Addendum)
Pls cosign for pt prolia inj..Tamara Gregory    prolia Injection given.   Binnie Rail, MD

## 2020-02-07 NOTE — Telephone Encounter (Signed)
Appointment scheduled to have forms filled out.  

## 2020-02-07 NOTE — Progress Notes (Signed)
Subjective:    Patient ID: Tamara Gregory, female    DOB: Jan 08, 1925, 86 y.o.   MRN: [id number]  HPI The patient is here for follow up of their chronic medical problems.  Her husband is here with her.  She is here to have forms filled out for [facility].  She does require some assistance with some of her ADLs.  She does require assistance with bathing, occasional toileting, food preparation, appointments and ambulation.  Her husband gives her her medication.  She continues to deal with chronic back pain.  As long as she does not move much the pain is not too bad.  She is very sedentary.  She is taking all her medications as prescribed.  She did establish with a psychiatrist for her depression because we were not able to get it controlled.  She still feels depressed.  She is on a new sleep medication-Remeron and she states she did sleep well last night.     Medications and allergies reviewed with patient and updated if appropriate.  Patient Active Problem List   Diagnosis Date Noted  . Rib pain on left side 01/24/2020  . Acute pain of left shoulder 01/24/2020  . Abdominal pain 12/08/2019  . Decreased mobility and endurance 11/15/2019  . Iron deficiency anemia due to chronic blood loss 08/29/2019  . CAD (coronary artery disease) 08/17/2019  . invasive SCC (squamous cell carcinoma), leg, left 07/31/2019  . Hypothyroidism 07/01/2019  . Closed fracture of left hip ([place]) 06/30/2019  . Hair loss 06/29/2019  . Acute on chronic systolic CHF (congestive heart failure) ([place]) 05/14/2019  . Sleep disturbance 04/29/2019  . Tachycardia   . Steroid-induced hyperglycemia   . Labile blood pressure   . Leukocytosis   . E. coli UTI   . Hypoalbuminemia due to protein-calorie malnutrition ([place])   . Ocular myasthenia gravis ([place])   . Anemia   . Hip fracture ([place]) 03/10/2019  . Left displaced femoral neck fracture ([place]) 03/10/2019  . Weakness 12/04/2018  . Hypokalemia 03/18/2018  . Cancer  of cecum s/p robotic right colectomy 03/17/2018 03/17/2018  . Right ovarian cyst s/p RSO 03/17/2018  . Diarrhea 12/23/2017  . Large hiatal hernia 04/01/2017  . Sweating profusely 03/19/2017  . Hyperglycemia 03/19/2017  . Squamous cell skin cancer 11/05/2016  . Nosebleed 10/19/2016  . Osteoporosis 08/28/2016  . Essential hypertension, benign 08/21/2016  . Long term current use of systemic steroids 08/21/2016  . Primary open angle glaucoma of both eyes, mild stage 05/14/2016  . Ocular hypertension of left eye 05/08/2016  . GERD (gastroesophageal reflux disease) 05/06/2016  . Macular pucker, bilateral 11/14/2015  . Status post intraocular lens implant 04/04/2015  . Exudative age-related macular degeneration ([place]) 04/04/2015  . Chronic pain syndrome 02/07/2015  . Bilateral nonexudative age-related macular degeneration 01/16/2015  . Presence of intraocular lens 01/16/2015  . Insomnia 07/26/2014  . Urge incontinence of urine 01/17/2014  . Urinary urgency 01/17/2014  . Malaise and fatigue 10/04/2013  . PAC (premature atrial contraction) 10/04/2013  . Depression 02/15/2013  . Myasthenia gravis ([place]) 03/17/2012  . Spinal stenosis, lumbar 01/14/2012  . Ptosis of eyelid 01/14/2012  . Pure hypercholesterolemia 09/05/2011  . Benign hypertensive heart disease without heart failure 09/05/2011  . Osteoarthritis 09/05/2011    Current Outpatient Medications on File Prior to Visit  Medication Sig Dispense Refill  . acetaminophen (TYLENOL) 325 MG tablet Take 2 tablets (650 mg total) by mouth every 6 (six) hours as needed for headache.    [name]  aspirin 81 MG chewable tablet Chew 0.5 tablets (40.5 mg total) by mouth daily. 30 tablet 0  . brimonidine (ALPHAGAN) 0.2 % ophthalmic solution Place 1 drop into both eyes 3 (three) times daily.     . Cholecalciferol (VITAMIN D) 125 MCG (5000 UT) CAPS Take 5,000 Units by mouth daily.     . ferrous sulfate 325 (65 FE) MG tablet Take 325 mg by mouth 2 (two) times  daily with a meal.    . fluticasone (FLONASE) 50 MCG/ACT nasal spray Place 1 spray into both nostrils at bedtime.    . furosemide (LASIX) 20 MG tablet Take 2 tablets (40 mg total) by mouth daily. 90 tablet 3  . latanoprost (XALATAN) 0.005 % ophthalmic solution Place 1 drop into the left eye at bedtime.  1  . levothyroxine (SYNTHROID) 25 MCG tablet Take 1 tablet (25 mcg total) by mouth daily before breakfast. Need office visit for more refills. 90 tablet 0  . loperamide (IMODIUM) 2 MG capsule Take 1 capsule (2 mg total) by mouth daily. 90 capsule 3  . losartan (COZAAR) 25 MG tablet Take 1 tablet (25 mg total) by mouth daily. 90 tablet 3  . mirabegron ER (MYRBETRIQ) 25 MG TB24 tablet Take 1 tablet (25 mg total) by mouth daily. Need office visit for more refills 90 tablet 0  . mirtazapine (REMERON) 7.5 MG tablet Take 1 tablet (7.5 mg total) by mouth at bedtime. 90 tablet 1  . Multiple Vitamins-Minerals (PRESERVISION AREDS) CAPS Take 1 capsule by mouth 2 (two) times daily.     . Omega-3 Fatty Acids (FISH OIL) 435 MG CAPS Take 435 mg by mouth daily.    [name] omeprazole (PRILOSEC) 20 MG capsule Take 1 capsule (20 mg total) by mouth daily. 90 capsule 3  . potassium chloride SA (KLOR-CON) 20 MEQ tablet Dissolve 1 tablet daily in a small amount of water (may take 2 min to dissolve) then add to a small amount of apple sauce or pudding and eat right away 90 tablet 3  . predniSONE (DELTASONE) 5 MG tablet Take 10 mg by mouth daily with breakfast.    . pyridostigmine (MESTINON) 60 MG tablet Take 1 tablet at 8am and 3pm. 180 tablet 3  . rosuvastatin (CRESTOR) 10 MG tablet Take 1 tablet (10 mg total) by mouth daily. 90 tablet 3  . sertraline (ZOLOFT) 100 MG tablet TAKE 1 AND 1/2 TABLETS BY  MOUTH AT BEDTIME 135 tablet 3   No current facility-administered medications on file prior to visit.    Past Medical History:  Diagnosis Date  . Anemia   . Anxiety   . Arthritis   . Cancer (HCC)    hx of skin cancer ,  colon   . Cataract    removed both eyes   . Chronic back pain    okay with sitting, increased back pain with activity   . Depression   . GERD (gastroesophageal reflux disease)   . Glaucoma   . Headache(784.0)    hx of  . History of kidney stones   . History of migraines   . Hyperlipidemia   . Hypertension   . Macular degeneration   . Myasthenia gravis ([place])   . Occasional tremors   . Pneumonia   . Spinal stenosis   . Thoracic aneurysm    4.4cm; see CT done 01/28/12 and 01/22/13 in [facility].   [name] Uses hearing aid   . Vitamin D deficiency     Past Surgical History:  Procedure Laterality Date  . ABDOMINAL HYSTERECTOMY    . CHOLECYSTECTOMY    . COLON SURGERY     Right colectomy dr. gross 03-17-18  . HIP ARTHROPLASTY Left 03/11/2019   Procedure: ARTHROPLASTY BIPOLAR HIP (HEMIARTHROPLASTY);  Surgeon: Renette Butters, MD;  Location: [facility];  Service: Orthopedics;  Laterality: Left;  . LUMBAR LAMINECTOMY/DECOMPRESSION MICRODISCECTOMY N/A 02/04/2013   Procedure: CENTRAL DECOMPRESSION/LUMBAR LAMINECTOMY L3-L4 AND L4-L5 2 LEVELS;  Surgeon: Tobi Bastos, MD;  Location: [facility];  Service: Orthopedics;  Laterality: N/A;  . SPINAL CORD STIMULATOR IMPLANT     in right hip but not currently using because it did not help  . TONSILLECTOMY    . UPPER GASTROINTESTINAL ENDOSCOPY    . US ECHOCARDIOGRAPHY  05/18/2007   EF 55-60%    Social History   Socioeconomic History  . Marital status: Married    Spouse name: Not on file  . Number of children: 3  . Years of education: Not on file  . Highest education level: High school graduate  Occupational History  . Occupation: retired  Tobacco Use  . Smoking status: Never Smoker  . Smokeless tobacco: Never Used  Substance and Sexual Activity  . Alcohol use: No  . Drug use: No  . Sexual activity: Not Currently  Other Topics Concern  . Not on file  Social History Narrative   Lives with husband in a one story home.  Has 2 sons.  Retired.  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  Education: college.  [employer].       Right handed    Social Determinants of Health   Financial Resource Strain:   . Difficulty of Paying Living Expenses: Not on file  Food Insecurity:   . Worried About [name] fundraiser in the Last Year: Not on file  . Ran Out of Food in the Last Year: Not on file  Transportation Needs:   . Lack of Transportation (Medical): Not on file  . Lack of Transportation (Non-Medical): Not on file  Physical Activity:   . Days of Exercise per Week: Not on file  . Minutes of Exercise per Session: Not on file  Stress:   . Feeling of Stress : Not on file  Social Connections:   . Frequency of Communication with Friends and Family: Not on file  . Frequency of Social Gatherings with Friends and Family: Not on file  . Attends Religious Services: Not on file  . Active Member of Clubs or Organizations: Not on file  . Attends Archivist Meetings: Not on file  . Marital Status: Not on file    Family History  Problem Relation Age of Onset  . Heart attack Father   . Diabetes Father   . Cancer Sister   . Breast cancer Sister   . Cancer Brother   . Lung cancer Brother   . Cancer Sister   . Liver cancer Sister   . Diabetes Sister   . Healthy Son   . Colon polyps Neg Hx   . Colon cancer Neg Hx   . Esophageal cancer Neg Hx   . Rectal cancer Neg Hx   . Stomach cancer Neg Hx   . Adrenal disorder Neg Hx     Review of Systems  Constitutional: Negative for appetite change and fever.  Respiratory: Positive for shortness of breath (with exertion). Negative for cough and wheezing.   Cardiovascular: Negative for chest pain, palpitations and leg swelling.  Musculoskeletal: Positive for arthralgias and back pain.  Neurological: Negative for dizziness, light-headedness and headaches.  Psychiatric/Behavioral: Positive for dysphoric mood and sleep disturbance. The patient is nervous/anxious.          Objective:   Vitals:   02/08/20 1444  BP: 108/70  Pulse: 62  Resp: 16  Temp: 99.1 F (37.3 C)  SpO2: 90%   BP Readings from Last 3 Encounters:  02/08/20 108/70  01/24/20 128/82  12/31/19 101/70   Wt Readings from Last 3 Encounters:  12/31/19 151 lb (68.5 kg)  11/17/19 148 lb 12.8 oz (67.5 kg)  11/16/19 145 lb (65.8 kg)   Body mass index is 27.62 kg/m.   Physical Exam    Constitutional: Appears well-developed and well-nourished. No distress.  HENT:  Head: Normocephalic and atraumatic.  Neck: Neck supple. No tracheal deviation present. No thyromegaly present.  No cervical lymphadenopathy Cardiovascular: Normal rate, regular rhythm and normal heart sounds.   2/6 systolic murmur heard. No carotid bruit .  No edema Pulmonary/Chest: Effort normal and breath sounds normal. No respiratory distress. No has no wheezes. No rales. Abdomen: Soft, nontender, nondistended Skin: Skin is warm and dry. Not diaphoretic.  Psychiatric: Normal mood and affect. Behavior is normal.      Assessment & Plan:    See Problem List for Assessment and Plan of chronic medical problems.    This visit occurred during the SARS-CoV-2 public health emergency.  Safety protocols were in place, including screening questions prior to the visit, additional usage of staff PPE, and extensive cleaning of exam room while observing appropriate contact time as indicated for disinfecting solutions.

## 2020-02-08 ENCOUNTER — Encounter: Payer: Self-pay | Admitting: Internal Medicine

## 2020-02-08 ENCOUNTER — Ambulatory Visit (INDEPENDENT_AMBULATORY_CARE_PROVIDER_SITE_OTHER): Payer: Medicare Other | Admitting: Internal Medicine

## 2020-02-08 ENCOUNTER — Other Ambulatory Visit: Payer: Self-pay

## 2020-02-08 VITALS — BP 108/70 | HR 62 | Temp 99.1°F | Resp 16 | Ht 62.0 in

## 2020-02-08 DIAGNOSIS — G4700 Insomnia, unspecified: Secondary | ICD-10-CM | POA: Diagnosis not present

## 2020-02-08 DIAGNOSIS — R739 Hyperglycemia, unspecified: Secondary | ICD-10-CM

## 2020-02-08 DIAGNOSIS — E7800 Pure hypercholesterolemia, unspecified: Secondary | ICD-10-CM

## 2020-02-08 DIAGNOSIS — I10 Essential (primary) hypertension: Secondary | ICD-10-CM | POA: Diagnosis not present

## 2020-02-08 DIAGNOSIS — E039 Hypothyroidism, unspecified: Secondary | ICD-10-CM

## 2020-02-08 DIAGNOSIS — D500 Iron deficiency anemia secondary to blood loss (chronic): Secondary | ICD-10-CM | POA: Diagnosis not present

## 2020-02-08 DIAGNOSIS — F3289 Other specified depressive episodes: Secondary | ICD-10-CM

## 2020-02-08 DIAGNOSIS — Z7409 Other reduced mobility: Secondary | ICD-10-CM

## 2020-02-08 DIAGNOSIS — K219 Gastro-esophageal reflux disease without esophagitis: Secondary | ICD-10-CM

## 2020-02-08 DIAGNOSIS — G479 Sleep disorder, unspecified: Secondary | ICD-10-CM

## 2020-02-08 LAB — COMPREHENSIVE METABOLIC PANEL
ALT: 17 U/L (ref 0–35)
AST: 21 U/L (ref 0–37)
Albumin: 4.3 g/dL (ref 3.5–5.2)
Alkaline Phosphatase: 76 U/L (ref 39–117)
BUN: 24 mg/dL — ABNORMAL HIGH (ref 6–23)
CO2: 32 mEq/L (ref 19–32)
Calcium: 9.4 mg/dL (ref 8.4–10.5)
Chloride: 104 mEq/L (ref 96–112)
Creatinine, Ser: 0.8 mg/dL (ref 0.40–1.20)
GFR: 66.93 mL/min (ref >60.00)
Glucose, Bld: 129 mg/dL — ABNORMAL HIGH (ref 70–99)
Potassium: 4.6 mEq/L (ref 3.5–5.1)
Sodium: 142 mEq/L (ref 135–145)
Total Bilirubin: 0.5 mg/dL (ref 0.2–1.2)
Total Protein: 7 g/dL (ref 6.0–8.3)

## 2020-02-08 LAB — LIPID PANEL
Cholesterol: 166 mg/dL (ref 0–200)
HDL: 63.4 mg/dL (ref >39.00)
LDL Cholesterol: 81 mg/dL (ref 0–99)
NonHDL: 103.08
Total CHOL/HDL Ratio: 3
Triglycerides: 108 mg/dL (ref 0.0–149.0)
VLDL: 21.6 mg/dL (ref 0.0–40.0)

## 2020-02-08 LAB — CBC WITH DIFFERENTIAL/PLATELET
Basophils Absolute: 0 10*3/uL (ref 0.0–0.1)
Basophils Relative: 0.3 % (ref 0.0–3.0)
Eosinophils Absolute: 0 10*3/uL (ref 0.0–0.7)
Eosinophils Relative: 0.4 % (ref 0.0–5.0)
HCT: 36.9 % (ref 36.0–46.0)
Hemoglobin: 12.1 g/dL (ref 12.0–15.0)
Lymphocytes Relative: 8.2 % — ABNORMAL LOW (ref 12.0–46.0)
Lymphs Abs: 0.6 10*3/uL — ABNORMAL LOW (ref 0.7–4.0)
MCHC: 32.7 g/dL (ref 30.0–36.0)
MCV: 100.8 fl — ABNORMAL HIGH (ref 78.0–100.0)
Monocytes Absolute: 0.6 10*3/uL (ref 0.1–1.0)
Monocytes Relative: 9 % (ref 3.0–12.0)
Neutro Abs: 5.8 10*3/uL (ref 1.4–7.7)
Neutrophils Relative %: 82.1 % — ABNORMAL HIGH (ref 43.0–77.0)
Platelets: 266 10*3/uL (ref 150.0–400.0)
RBC: 3.66 Mil/uL — ABNORMAL LOW (ref 3.87–5.11)
RDW: 15.2 % (ref 11.5–15.5)
WBC: 7 10*3/uL (ref 4.0–10.5)

## 2020-02-08 LAB — TSH: TSH: 2 u[IU]/mL (ref 0.35–4.50)

## 2020-02-08 LAB — HEMOGLOBIN A1C: Hgb A1c MFr Bld: 6.1 % (ref 4.6–6.5)

## 2020-02-08 NOTE — Assessment & Plan Note (Signed)
Chronic Not controlled She has established with a psychiatrist Management per psychiatry

## 2020-02-08 NOTE — Assessment & Plan Note (Signed)
Chronic Not compliant with a low sugar diet A1c

## 2020-02-08 NOTE — Assessment & Plan Note (Signed)
Chronic BP well controlled Current regimen effective and well tolerated Continue current medications at current doses cmp  

## 2020-02-08 NOTE — Assessment & Plan Note (Signed)
Chronic Significant decrease mobility and endurance secondary to chronic back pain and sedentary lifestyle

## 2020-02-08 NOTE — Assessment & Plan Note (Signed)
Chronic Check lipid panel  Continue daily statin 

## 2020-02-08 NOTE — Assessment & Plan Note (Signed)
Currently taking iron Has seen hematology We will check iron levels, CBC Ideally she would like to stop taking iron pills

## 2020-02-08 NOTE — Assessment & Plan Note (Signed)
Chronic GERD controlled Continue daily medication-omeprazole 20 mg daily  

## 2020-02-08 NOTE — Assessment & Plan Note (Signed)
Chronic Just put on Remeron, which has helped Still takes Tylenol PM, which I encouraged her to try to sleep without-just take the Remeron

## 2020-02-08 NOTE — Patient Instructions (Addendum)
  Blood work was ordered.   ° ° °Medications reviewed and updated.  Changes include :   none ° ° ° °Please followup in 6 months ° ° °

## 2020-02-09 LAB — IRON,TIBC AND FERRITIN PANEL
%SAT: 18 % (calc) (ref 16–45)
Ferritin: 50 ng/mL (ref 16–288)
Iron: 73 ug/dL (ref 45–160)
TIBC: 412 mcg/dL (calc) (ref 250–450)

## 2020-02-14 ENCOUNTER — Encounter: Payer: Medicare Other | Admitting: Physical Medicine & Rehabilitation

## 2020-02-16 ENCOUNTER — Other Ambulatory Visit: Payer: Self-pay | Admitting: Internal Medicine

## 2020-03-01 ENCOUNTER — Other Ambulatory Visit: Payer: Medicare Other

## 2020-03-01 ENCOUNTER — Ambulatory Visit: Payer: Medicare Other | Admitting: Hematology

## 2020-03-02 ENCOUNTER — Other Ambulatory Visit: Payer: Self-pay

## 2020-03-02 ENCOUNTER — Telehealth: Payer: Medicare Other | Admitting: Neurology

## 2020-03-02 ENCOUNTER — Ambulatory Visit (INDEPENDENT_AMBULATORY_CARE_PROVIDER_SITE_OTHER): Payer: Medicare Other

## 2020-03-02 VITALS — BP 130/70 | HR 73 | Temp 98.1°F | Resp 16 | Ht 62.0 in | Wt 151.0 lb

## 2020-03-02 DIAGNOSIS — Z0000 Encounter for general adult medical examination without abnormal findings: Secondary | ICD-10-CM

## 2020-03-02 NOTE — Patient Instructions (Addendum)
Tamara Gregory , Thank you for taking time to come for your Medicare Wellness Visit. I appreciate your ongoing commitment to your health goals. Please review the following plan we discussed and let me know if I can assist you in the future.   Screening recommendations/referrals: Colorectal Screening: last done 03/04/2018  Mammogram: not recommended due to age Bone Density: not recommended due to age  Vision and Dental Exams: Recommended annual ophthalmology exams for early detection of glaucoma and other disorders of the eye Recommended annual dental exams for proper oral hygiene  Vaccinations: Influenza vaccine: up to date; last done 08/30/2019 Pneumococcal vaccine: up to date; last done 08/12/2013, 11/19/2017 Tdap vaccine: Declined Shingles vaccine: Please call your insurance company to determine your out of pocket expense for the Shingrix vaccine. You may receive this vaccine at your local pharmacy. Covid vaccine: up to date, received on 12/30/2019, 2/23/201 Therapist, music)  Advanced directives: Advance directives discussed with you today.  Please bring a copy of your POA (Power of [place]) and/or Living Will to your next appointment.  Goals:  Recommend to drink at least 6-8 8oz glasses of water per day.  Recommend to exercise for at least 150 minutes per week.  Recommend to remove any items from the home that may cause slips or trips.  Recommend to decrease portion sizes by eating 3 small healthy meals and at least 2 healthy snacks per day.  Recommend to begin DASH diet as directed below  Recommend to continue efforts to reduce smoking habits until no longer smoking. Smoking Cessation literature is attached below.  Next appointment: Please schedule your Annual Wellness Visit with your Nurse Health Advisor in one year.  Preventive Care 29 Years and Older, Female Preventive care refers to lifestyle choices and visits with your health care provider that can promote health and wellness. What  does preventive care include?  A yearly physical exam. This is also called an annual well check.  Dental exams once or twice a year.  Routine eye exams. Ask your health care provider how often you should have your eyes checked.  Personal lifestyle choices, including:  Daily care of your teeth and gums.  Regular physical activity.  Eating a healthy diet.  Avoiding tobacco and drug use.  Limiting alcohol use.  Practicing safe sex.  Taking low-dose aspirin every day if recommended by your health care provider.  Taking vitamin and mineral supplements as recommended by your health care provider. What happens during an annual well check? The services and screenings done by your health care provider during your annual well check will depend on your age, overall health, lifestyle risk factors, and family history of disease. Counseling  Your health care provider may ask you questions about your:  Alcohol use.  Tobacco use.  Drug use.  Emotional well-being.  Home and relationship well-being.  Sexual activity.  Eating habits.  History of falls.  Memory and ability to understand (cognition).  Work and work Statistician.  Reproductive health. Screening  You may have the following tests or measurements:  Height, weight, and BMI.  Blood pressure.  Lipid and cholesterol levels. These may be checked every 5 years, or more frequently if you are over 19 years old.  Skin check.  Lung cancer screening. You may have this screening every year starting at age 58 if you have a 30-pack-year history of smoking and currently smoke or have quit within the past 15 years.  Fecal occult blood test (FOBT) of the stool. You may have this  test every year starting at age 59.  Flexible sigmoidoscopy or colonoscopy. You may have a sigmoidoscopy every 5 years or a colonoscopy every 10 years starting at age 4.  Hepatitis C blood test.  Hepatitis B blood test.  Sexually transmitted  disease (STD) testing.  Diabetes screening. This is done by checking your blood sugar (glucose) after you have not eaten for a while (fasting). You may have this done every 1-3 years.  Bone density scan. This is done to screen for osteoporosis. You may have this done starting at age 49.  Mammogram. This may be done every 1-2 years. Talk to your health care provider about how often you should have regular mammograms. Talk with your health care provider about your test results, treatment options, and if necessary, the need for more tests. Vaccines  Your health care provider may recommend certain vaccines, such as:  Influenza vaccine. This is recommended every year.  Tetanus, diphtheria, and acellular pertussis (Tdap, Td) vaccine. You may need a Td booster every 10 years.  Zoster vaccine. You may need this after age 60.  Pneumococcal 13-valent conjugate (PCV13) vaccine. One dose is recommended after age 1.  Pneumococcal polysaccharide (PPSV23) vaccine. One dose is recommended after age 46. Talk to your health care provider about which screenings and vaccines you need and how often you need them. This information is not intended to replace advice given to you by your health care provider. Make sure you discuss any questions you have with your health care provider. Document Released: 12/15/2015 Document Revised: 08/07/2016 Document Reviewed: 09/19/2015 Elsevier Interactive Patient Education  2017 [employer] Prevention in the Home Falls can cause injuries. They can happen to people of all ages. There are many things you can do to make your home safe and to help prevent falls. What can I do on the outside of my home?  Regularly fix the edges of walkways and driveways and fix any cracks.  Remove anything that might make you trip as you walk through a door, such as a raised step or threshold.  Trim any bushes or trees on the path to your home.  Use bright outdoor  lighting.  Clear any walking paths of anything that might make someone trip, such as rocks or tools.  Regularly check to see if handrails are loose or broken. Make sure that both sides of any steps have handrails.  Any raised decks and porches should have guardrails on the edges.  Have any leaves, snow, or ice cleared regularly.  Use sand or salt on walking paths during winter.  Clean up any spills in your garage right away. This includes oil or grease spills. What can I do in the bathroom?  Use night lights.  Install grab bars by the toilet and in the tub and shower. Do not use towel bars as grab bars.  Use non-skid mats or decals in the tub or shower.  If you need to sit down in the shower, use a plastic, non-slip stool.  Keep the floor dry. Clean up any water that spills on the floor as soon as it happens.  Remove soap buildup in the tub or shower regularly.  Attach bath mats securely with double-sided non-slip rug tape.  Do not have throw rugs and other things on the floor that can make you trip. What can I do in the bedroom?  Use night lights.  Make sure that you have a light by your bed that is easy to reach.  Do not use any sheets or blankets that are too big for your bed. They should not hang down onto the floor.  Have a firm chair that has side arms. You can use this for support while you get dressed.  Do not have throw rugs and other things on the floor that can make you trip. What can I do in the kitchen?  Clean up any spills right away.  Avoid walking on wet floors.  Keep items that you use a lot in easy-to-reach places.  If you need to reach something above you, use a strong step stool that has a grab bar.  Keep electrical cords out of the way.  Do not use floor polish or wax that makes floors slippery. If you must use wax, use non-skid floor wax.  Do not have throw rugs and other things on the floor that can make you trip. What can I do with my  stairs?  Do not leave any items on the stairs.  Make sure that there are handrails on both sides of the stairs and use them. Fix handrails that are broken or loose. Make sure that handrails are as long as the stairways.  Check any carpeting to make sure that it is firmly attached to the stairs. Fix any carpet that is loose or worn.  Avoid having throw rugs at the top or bottom of the stairs. If you do have throw rugs, attach them to the floor with carpet tape.  Make sure that you have a light switch at the top of the stairs and the bottom of the stairs. If you do not have them, ask someone to add them for you. What else can I do to help prevent falls?  Wear shoes that:  Do not have high heels.  Have rubber bottoms.  Are comfortable and fit you well.  Are closed at the toe. Do not wear sandals.  If you use a stepladder:  Make sure that it is fully opened. Do not climb a closed stepladder.  Make sure that both sides of the stepladder are locked into place.  Ask someone to hold it for you, if possible.  Clearly mark and make sure that you can see:  Any grab bars or handrails.  First and last steps.  Where the edge of each step is.  Use tools that help you move around (mobility aids) if they are needed. These include:  Canes.  Walkers.  Scooters.  Crutches.  Turn on the lights when you go into a dark area. Replace any light bulbs as soon as they burn out.  Set up your furniture so you have a clear path. Avoid moving your furniture around.  If any of your floors are uneven, fix them.  If there are any pets around you, be aware of where they are.  Review your medicines with your doctor. Some medicines can make you feel dizzy. This can increase your chance of falling. Ask your doctor what other things that you can do to help prevent falls. This information is not intended to replace advice given to you by your health care provider. Make sure you discuss any  questions you have with your health care provider. Document Released: 09/14/2009 Document Revised: 04/25/2016 Document Reviewed: 12/23/2014 Elsevier Interactive Patient Education  2017 [employer].

## 2020-03-02 NOTE — Progress Notes (Signed)
Subjective:   Tamara Gregory is a 86 y.o. female who presents for Medicare Annual (Subsequent) preventive examination.  Review of Systems:  Medicare Wellness Visit Cardiac Risk Factors include: advanced age (>77men, >67 women);dyslipidemia;family history of premature cardiovascular disease Sleep Patterns: History of insomnia; issues with incontinence during sleep at times. Home Safety/Smoke Alarms: Feels safe in home; Smoke alarms in place. Living environment: 1-story home.  Lives with husband. Seat Belt Safety/Bike Helmet: Wears seat belt.    Objective:     Vitals: BP 130/70 (BP Location: Right Arm, Patient Position: Sitting, Cuff Size: Normal)   Pulse 73   Temp 98.1 F (36.7 C)   Resp 16   Ht 5\' 2"  (1.575 m)   Wt 151 lb (68.5 kg)   SpO2 94%   BMI 27.62 kg/m   Body mass index is 27.62 kg/m.  Advanced Directives 03/02/2020 12/31/2019 11/02/2019 10/06/2019 09/22/2019 08/27/2019 08/10/2019  Does Patient Have a Medical Advance Directive? Yes Yes Yes Yes Yes Yes No  Type of Paramedic of [place];Living will - - Living will;Healthcare Power of [place];Living will - -  Does patient want to make changes to medical advance directive? No - Patient declined - - - - - -  Copy of [place] in Chart? No - copy requested - - - - - -  Would patient like information on creating a medical advance directive? - - - - - - No - Patient declined  Pre-existing out of facility DNR order (yellow form or pink MOST form) - - - - - - -    Tobacco Social History   Tobacco Use  Smoking Status Never Smoker  Smokeless Tobacco Never Used     Counseling given: No   Clinical Intake:  Pre-visit preparation completed: Yes  Pain : Faces Pain Score: 2  Faces Pain Scale: Hurts a little bit Pain Type: Chronic pain Pain Location: Back Pain Orientation: Lower Pain Descriptors / Indicators: Throbbing Pain Onset: More than a month  ago Pain Frequency: Occasional Effect of Pain on Daily Activities: Not able to be active  Faces Pain Scale: Hurts a little bit  Diabetes: No  How often do you need to have someone help you when you read instructions, pamphlets, or other written materials from your doctor or pharmacy?: 4 - Often(her husband helps)  Interpreter Needed?: No  Information entered by :: Blannie Shedlock N. Lowell Guitar, LPN  Past Medical History:  Diagnosis Date  . Anemia   . Anxiety   . Arthritis   . Cancer (HCC)    hx of skin cancer , colon   . Cataract    removed both eyes   . Chronic back pain    okay with sitting, increased back pain with activity   . Depression   . GERD (gastroesophageal reflux disease)   . Glaucoma   . Headache(784.0)    hx of  . History of kidney stones   . History of migraines   . Hyperlipidemia   . Hypertension   . Macular degeneration   . Myasthenia gravis ([place])   . Occasional tremors   . Pneumonia   . Spinal stenosis   . Thoracic aneurysm    4.4cm; see CT done 01/28/12 and 01/22/13 in [facility].   [name] Uses hearing aid   . Vitamin D deficiency    Past Surgical History:  Procedure Laterality Date  . ABDOMINAL HYSTERECTOMY    . CHOLECYSTECTOMY    .  COLON SURGERY     Right colectomy dr. gross 03-17-18  . HIP ARTHROPLASTY Left 03/11/2019   Procedure: ARTHROPLASTY BIPOLAR HIP (HEMIARTHROPLASTY);  Surgeon: Renette Butters, MD;  Location: [facility];  Service: Orthopedics;  Laterality: Left;  . LUMBAR LAMINECTOMY/DECOMPRESSION MICRODISCECTOMY N/A 02/04/2013   Procedure: CENTRAL DECOMPRESSION/LUMBAR LAMINECTOMY L3-L4 AND L4-L5 2 LEVELS;  Surgeon: Tobi Bastos, MD;  Location: [facility];  Service: Orthopedics;  Laterality: N/A;  . SPINAL CORD STIMULATOR IMPLANT     in right hip but not currently using because it did not help  . TONSILLECTOMY    . UPPER GASTROINTESTINAL ENDOSCOPY    . US ECHOCARDIOGRAPHY  05/18/2007   EF 55-60%   Family History  Problem Relation Age of Onset  . Heart  attack Father   . Diabetes Father   . Cancer Sister   . Breast cancer Sister   . Cancer Brother   . Lung cancer Brother   . Cancer Sister   . Liver cancer Sister   . Diabetes Sister   . Healthy Son   . Colon polyps Neg Hx   . Colon cancer Neg Hx   . Esophageal cancer Neg Hx   . Rectal cancer Neg Hx   . Stomach cancer Neg Hx   . Adrenal disorder Neg Hx    Social History   Socioeconomic History  . Marital status: Married    Spouse name: Not on file  . Number of children: 3  . Years of education: Not on file  . Highest education level: High school graduate  Occupational History  . Occupation: retired  Tobacco Use  . Smoking status: Never Smoker  . Smokeless tobacco: Never Used  Substance and Sexual Activity  . Alcohol use: No  . Drug use: No  . Sexual activity: Not Currently  Other Topics Concern  . Not on file  Social History Narrative   Lives with husband in a one story home.  Has 2 sons.  Retired.  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  Education: college.  [employer].       Right handed    Social Determinants of Health   Financial Resource Strain:   . Difficulty of Paying Living Expenses:   Food Insecurity:   . Worried About [name] fundraiser in the Last Year:   . Arboriculturist in the Last Year:   Transportation Needs:   . Film/video editor (Medical):   [name] Lack of Transportation (Non-Medical):   Physical Activity:   . Days of Exercise per Week:   . Minutes of Exercise per Session:   Stress:   . Feeling of Stress :   Social Connections:   . Frequency of Communication with Friends and Family:   . Frequency of Social Gatherings with Friends and Family:   . Attends Religious Services:   . Active Member of Clubs or Organizations:   . Attends Archivist Meetings:   [name] Marital Status:     Outpatient Encounter Medications as of 03/02/2020  Medication Sig  . acetaminophen (TYLENOL) 325 MG tablet Take 2 tablets (650 mg  total) by mouth every 6 (six) hours as needed for headache.  [name] aspirin 81 MG chewable tablet Chew 0.5 tablets (40.5 mg total) by mouth daily.  . brimonidine (ALPHAGAN) 0.2 % ophthalmic solution Place 1 drop into both eyes 3 (three) times daily.   . Cholecalciferol (VITAMIN D) 125 MCG (5000 UT) CAPS Take 5,000 Units by  mouth daily.   . ferrous sulfate 325 (65 FE) MG tablet Take 325 mg by mouth 2 (two) times daily with a meal.  . fluticasone (FLONASE) 50 MCG/ACT nasal spray Place 1 spray into both nostrils at bedtime.  . furosemide (LASIX) 20 MG tablet Take 2 tablets (40 mg total) by mouth daily.  [name] latanoprost (XALATAN) 0.005 % ophthalmic solution Place 1 drop into the left eye at bedtime.  [name] levothyroxine (SYNTHROID) 25 MCG tablet Take 1 tablet (25 mcg total) by mouth daily before breakfast. Need office visit for more refills.  [name] loperamide (IMODIUM) 2 MG capsule Take 1 capsule (2 mg total) by mouth daily.  [name] losartan (COZAAR) 25 MG tablet Take 1 tablet (25 mg total) by mouth daily.  . mirtazapine (REMERON) 7.5 MG tablet Take 1 tablet (7.5 mg total) by mouth at bedtime.  . Multiple Vitamins-Minerals (PRESERVISION AREDS) CAPS Take 1 capsule by mouth 2 (two) times daily.   [name] MYRBETRIQ 25 MG TB24 tablet TAKE 1 TABLET BY MOUTH  DAILY NEED OFFICE VISIT FOR MORE REFILLS  . Omega-3 Fatty Acids (FISH OIL) 435 MG CAPS Take 435 mg by mouth daily.  [name] omeprazole (PRILOSEC) 20 MG capsule Take 1 capsule (20 mg total) by mouth daily.  . potassium chloride SA (KLOR-CON) 20 MEQ tablet Dissolve 1 tablet daily in a small amount of water (may take 2 min to dissolve) then add to a small amount of apple sauce or pudding and eat right away  . predniSONE (DELTASONE) 5 MG tablet Take 10 mg by mouth daily with breakfast.  . pyridostigmine (MESTINON) 60 MG tablet Take 1 tablet at 8am and 3pm.  . rosuvastatin (CRESTOR) 10 MG tablet Take 1 tablet (10 mg total) by mouth daily.  . sertraline (ZOLOFT) 100 MG tablet TAKE 1 AND  1/2 TABLETS BY  MOUTH AT BEDTIME   No facility-administered encounter medications on file as of 03/02/2020.    Activities of Daily Living In your present state of health, do you have any difficulty performing the following activities: 03/02/2020  Hearing? Y  Vision? Y  Difficulty concentrating or making decisions? Y  Walking or climbing stairs? Y  Comment uses a walker  Dressing or bathing? Y  Doing errands, shopping? Y  Preparing Food and eating ? Y  Using the Toilet? Y  In the past six months, have you accidently leaked urine? Y  Comment wears diapers  Do you have problems with loss of bowel control? Y  Comment wears diapers  Managing your Medications? Y  Managing your Finances? Y  Housekeeping or managing your Housekeeping? Y  Some recent data might be hidden    Patient Care Team: Binnie Rail, MD as PCP - General (Internal Medicine) Nahser, Wonda Cheng, MD as PCP - Cardiology (Cardiology) Michael Boston, MD as Consulting Physician (General Surgery) Milus Banister, MD as Attending Physician (Gastroenterology) Nahser, Wonda Cheng, MD as Consulting Physician (Cardiology) Alda Berthold, DO as Consulting Physician (Neurology) Feliz Beam, MD as Referring Physician (Ophthalmology) Alda Berthold, DO as Consulting Physician (Neurology)    Assessment:   This is a routine wellness examination for Jaxson.  Exercise Activities and Dietary recommendations Current Exercise Habits: The patient does not participate in regular exercise at present, Exercise limited by: cardiac condition(s);orthopedic condition(s)  Goals    . Patient Stated     Stay as healthy and as independent as possible    . Patient Stated     Her goal is to get  better and to be more active       Fall Risk Fall Risk  03/02/2020 01/24/2020 12/31/2019 11/16/2019 11/02/2019  Falls in the past year? 1 1 0 1 1  Comment - - - - -  Number falls in past yr: 1 1 0 1 1  Comment - - - - -  Injury with Fall? 0 1 0 1 0   Comment - - - - -  Risk Factor Category  - - - - -  Risk for fall due to : History of fall(s);Impaired balance/gait;Impaired vision - - - -  Follow up Falls evaluation completed;Education provided;Falls prevention discussed - - - -   Is the patient's home free of loose throw rugs in walkways, pet beds, electrical cords, etc?   yes      Grab bars in the bathroom? yes      Handrails on the stairs?   yes      Adequate lighting?   yes  Depression Screen PHQ 2/9 Scores 01/27/2019 11/19/2017 03/19/2017 05/06/2016  PHQ - 2 Score 6 5 5  0  PHQ- 9 Score 16 12 10  -     Cognitive Function MMSE - Mini Mental State Exam 01/27/2019 11/19/2017  Not completed: Refused Refused        Immunization History  Administered Date(s) Administered  . Fluad Quad(high Dose 65+) 08/30/2019  . Influenza,inj,Quad PF,6+ Mos 09/29/2018  . Influenza-Unspecified 08/12/2013, 08/19/2015, 08/02/2017  . PFIZER SARS-COV-2 Vaccination 12/30/2019, 01/25/2020  . Pneumococcal Conjugate-13 11/19/2017  . Pneumococcal Polysaccharide-23 08/12/2013  . Zoster 08/19/2015    Screening Tests Health Maintenance  Topic Date Due  . TETANUS/TDAP  Never done  . COLONOSCOPY  02/07/2021 (Originally 03/05/2019)  . INFLUENZA VACCINE  07/02/2020  . DEXA SCAN  01/25/2022  . PNA vac Low Risk Adult  Completed    Cancer Screenings: Lung: Low Dose CT Chest recommended if Age 78-80 years, 30 pack-year currently smoking OR have quit w/in 15years. Patient does not qualify. Breast:  Up to date on Mammogram? No, not recommended due to age   Up to date of Bone Density/Dexa? No, not recommended due to age Colorectal: not recommended due to age     Plan:   Reviewed health maintenance screenings with patient today and relevant education, vaccines, and/or referrals were provided.    Continue doing brain stimulating activities (puzzles, reading, adult coloring books, staying active) to keep memory sharp.    Continue to eat heart healthy diet  (full of fruits, vegetables, whole grains, lean protein, water--limit salt, fat, and sugar intake) and increase physical activity as tolerated. I have personally reviewed and noted the following in the patient's chart:   . Medical and social history . Use of alcohol, tobacco or illicit drugs  . Current medications and supplements . Functional ability and status . Nutritional status . Physical activity . Advanced directives . List of other physicians . Hospitalizations, surgeries, and ER visits in previous 12 months . Vitals . Screenings to include cognitive, depression, and falls . Referrals and appointments  In addition, I have reviewed and discussed with patient certain preventive protocols, quality metrics, and best practice recommendations. A written personalized care plan for preventive services as well as general preventive health recommendations were provided to patient.     Sheral Flow, LPN  [id number] Nurse Health Advisor

## 2020-03-06 ENCOUNTER — Telehealth: Payer: Self-pay | Admitting: Adult Health

## 2020-03-06 ENCOUNTER — Ambulatory Visit: Payer: Medicare Other | Admitting: Adult Health

## 2020-03-06 DIAGNOSIS — G4700 Insomnia, unspecified: Secondary | ICD-10-CM

## 2020-03-06 DIAGNOSIS — F411 Generalized anxiety disorder: Secondary | ICD-10-CM

## 2020-03-06 DIAGNOSIS — F331 Major depressive disorder, recurrent, moderate: Secondary | ICD-10-CM

## 2020-03-06 NOTE — Progress Notes (Signed)
Tamara Gregory [id number] [birth year] 86 y.o.  Virtual Visit via Telephone Note  I connected with pt on 03/06/20 at  3:00 PM EDT by telephone and verified that I am speaking with the correct person using two identifiers.   I discussed the limitations, risks, security and privacy concerns of performing an evaluation and management service by telephone and the availability of in person appointments. I also discussed with the patient that there may be a patient responsible charge related to this service. The patient expressed understanding and agreed to proceed.   I discussed the assessment and treatment plan with the patient. The patient was provided an opportunity to ask questions and all were answered. The patient agreed with the plan and demonstrated an understanding of the instructions.  The patient was advised to call back or seek an in-person evaluation if the symptoms worsen or if the condition fails to improve as anticipated.  I provided 30 minutes of non-face-to-face time during this encounter.  The patient was located at home.  The provider was located at [place].   Tamara Gell, NP   Subjective:   Patient ID:  Tamara Gregory is a 86 y.o. (DOB [birth year]) female.  Chief Complaint: No chief complaint on file.   Review of Systems:  Review of Systems  Medications: I have reviewed the patient's current medications.  Current Outpatient Medications  Medication Sig Dispense Refill  . acetaminophen (TYLENOL) 325 MG tablet Take 2 tablets (650 mg total) by mouth every 6 (six) hours as needed for headache.    [name] aspirin 81 MG chewable tablet Chew 0.5 tablets (40.5 mg total) by mouth daily. 30 tablet 0  . brimonidine (ALPHAGAN) 0.2 % ophthalmic solution Place 1 drop into both eyes 3 (three) times daily.     . Cholecalciferol (VITAMIN D) 125 MCG (5000 UT) CAPS Take 5,000 Units by mouth daily.     . ferrous sulfate 325 (65 FE) MG tablet Take 325 mg by mouth 2 (two)  times daily with a meal.    . fluticasone (FLONASE) 50 MCG/ACT nasal spray Place 1 spray into both nostrils at bedtime.    . furosemide (LASIX) 20 MG tablet Take 2 tablets (40 mg total) by mouth daily. 90 tablet 3  . latanoprost (XALATAN) 0.005 % ophthalmic solution Place 1 drop into the left eye at bedtime.  1  . levothyroxine (SYNTHROID) 25 MCG tablet Take 1 tablet (25 mcg total) by mouth daily before breakfast. Need office visit for more refills. 90 tablet 0  . loperamide (IMODIUM) 2 MG capsule Take 1 capsule (2 mg total) by mouth daily. 90 capsule 3  . losartan (COZAAR) 25 MG tablet Take 1 tablet (25 mg total) by mouth daily. 90 tablet 3  . mirtazapine (REMERON) 7.5 MG tablet Take 1 tablet (7.5 mg total) by mouth at bedtime. 90 tablet 1  . Multiple Vitamins-Minerals (PRESERVISION AREDS) CAPS Take 1 capsule by mouth 2 (two) times daily.     [name] MYRBETRIQ 25 MG TB24 tablet TAKE 1 TABLET BY MOUTH  DAILY NEED OFFICE VISIT FOR MORE REFILLS 90 tablet 1  . Omega-3 Fatty Acids (FISH OIL) 435 MG CAPS Take 435 mg by mouth daily.    [name] omeprazole (PRILOSEC) 20 MG capsule Take 1 capsule (20 mg total) by mouth daily. 90 capsule 3  . potassium chloride SA (KLOR-CON) 20 MEQ tablet Dissolve 1 tablet daily in a small amount of water (may take 2 min to dissolve) then add to a small  amount of apple sauce or pudding and eat right away 90 tablet 3  . predniSONE (DELTASONE) 5 MG tablet Take 10 mg by mouth daily with breakfast.    . pyridostigmine (MESTINON) 60 MG tablet Take 1 tablet at 8am and 3pm. 180 tablet 3  . rosuvastatin (CRESTOR) 10 MG tablet Take 1 tablet (10 mg total) by mouth daily. 90 tablet 3  . sertraline (ZOLOFT) 100 MG tablet TAKE 1 AND 1/2 TABLETS BY  MOUTH AT BEDTIME 135 tablet 3   No current facility-administered medications for this visit.    Medication Side Effects: None  Allergies:  Allergies  Allergen Reactions  . Tramadol Other (See Comments)    delirium    Past Medical History:   Diagnosis Date  . Anemia   . Anxiety   . Arthritis   . Cancer (HCC)    hx of skin cancer , colon   . Cataract    removed both eyes   . Chronic back pain    okay with sitting, increased back pain with activity   . Depression   . GERD (gastroesophageal reflux disease)   . Glaucoma   . Headache(784.0)    hx of  . History of kidney stones   . History of migraines   . Hyperlipidemia   . Hypertension   . Macular degeneration   . Myasthenia gravis ([place])   . Occasional tremors   . Pneumonia   . Spinal stenosis   . Thoracic aneurysm    4.4cm; see CT done 01/28/12 and 01/22/13 in [facility].   [name] Uses hearing aid   . Vitamin D deficiency     Family History  Problem Relation Age of Onset  . Heart attack Father   . Diabetes Father   . Cancer Sister   . Breast cancer Sister   . Cancer Brother   . Lung cancer Brother   . Cancer Sister   . Liver cancer Sister   . Diabetes Sister   . Healthy Son   . Colon polyps Neg Hx   . Colon cancer Neg Hx   . Esophageal cancer Neg Hx   . Rectal cancer Neg Hx   . Stomach cancer Neg Hx   . Adrenal disorder Neg Hx     Social History   Socioeconomic History  . Marital status: Married    Spouse name: Not on file  . Number of children: 3  . Years of education: Not on file  . Highest education level: High school graduate  Occupational History  . Occupation: retired  Tobacco Use  . Smoking status: Never Smoker  . Smokeless tobacco: Never Used  Substance and Sexual Activity  . Alcohol use: No  . Drug use: No  . Sexual activity: Not Currently  Other Topics Concern  . Not on file  Social History Narrative   Lives with husband in a one story home.  Has 2 sons.  Retired.  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  Education: college.  [employer].       Right handed    Social Determinants of Health   Financial Resource Strain:   . Difficulty of Paying Living Expenses:   Food Insecurity:   . Worried About Ship broker in the Last Year:   . Arboriculturist in the Last Year:   Transportation Needs:   . Film/video editor (Medical):   [name] Lack of Transportation (Non-Medical):   Physical Activity:   .  Days of Exercise per Week:   . Minutes of Exercise per Session:   Stress:   . Feeling of Stress :   Social Connections:   . Frequency of Communication with Friends and Family:   . Frequency of Social Gatherings with Friends and Family:   . Attends Religious Services:   . Active Member of Clubs or Organizations:   . Attends Archivist Meetings:   [name] Marital Status:   Intimate Partner Violence:   . Fear of Current or Ex-Partner:   . Emotionally Abused:   [name] Physically Abused:   . Sexually Abused:     Past Medical History, Surgical history, Social history, and Family history were reviewed and updated as appropriate.   Please see review of systems for further details on the patient's review from today.   Objective:   Physical Exam:  There were no vitals taken for this visit.  Physical Exam  Lab Review:     Component Value Date/Time   NA 142 02/08/2020 1521   NA 143 11/17/2019 1444   K 4.6 02/08/2020 1521   CL 104 02/08/2020 1521   CO2 32 02/08/2020 1521   GLUCOSE 129 (H) 02/08/2020 1521   BUN 24 (H) 02/08/2020 1521   BUN 30 11/17/2019 1444   CREATININE 0.80 02/08/2020 1521   CREATININE 0.77 07/20/2019 1214   CREATININE 0.96 (H) 02/28/2016 0918   CALCIUM 9.4 02/08/2020 1521   PROT 7.0 02/08/2020 1521   PROT 6.4 11/17/2019 1444   ALBUMIN 4.3 02/08/2020 1521   ALBUMIN 4.5 11/17/2019 1444   AST 21 02/08/2020 1521   AST 22 07/20/2019 1214   ALT 17 02/08/2020 1521   ALT 17 07/20/2019 1214   ALKPHOS 76 02/08/2020 1521   BILITOT 0.5 02/08/2020 1521   BILITOT 0.4 11/17/2019 1444   BILITOT 0.4 07/20/2019 1214   GFRNONAA 54 (L) 11/17/2019 1444   GFRNONAA >60 07/20/2019 1214   GFRAA 63 11/17/2019 1444   GFRAA >60 07/20/2019 1214       Component Value Date/Time   WBC  7.0 02/08/2020 1521   RBC 3.66 (L) 02/08/2020 1521   HGB 12.1 02/08/2020 1521   HGB 12.3 08/30/2019 1047   HGB 9.8 (L) 04/30/2019 1701   HCT 36.9 02/08/2020 1521   HCT 31.7 (L) 04/30/2019 1701   PLT 266.0 02/08/2020 1521   PLT 207 08/30/2019 1047   PLT 276 04/30/2019 1701   MCV 100.8 (H) 02/08/2020 1521   MCV 88 04/30/2019 1701   MCH 30.4 08/30/2019 1047   MCHC 32.7 02/08/2020 1521   RDW 15.2 02/08/2020 1521   RDW 16.0 (H) 04/30/2019 1701   LYMPHSABS 0.6 (L) 02/08/2020 1521   MONOABS 0.6 02/08/2020 1521   EOSABS 0.0 02/08/2020 1521   BASOSABS 0.0 02/08/2020 1521    No results found for: POCLITH, LITHIUM   No results found for: PHENYTOIN, PHENOBARB, VALPROATE, CBMZ   .res Assessment: Plan:      There are no diagnoses linked to this encounter.  Please see After Visit Summary for patient specific instructions.  Future Appointments  Date Time Provider [place]  03/09/2020  9:50 AM Narda Amber K, DO [facility] None  03/09/2020  1:45 PM [facility] 3 [facility] None  03/09/2020  2:20 PM Truitt Merle, MD [facility] None  06/13/2020  1:30 PM Binnie Rail, MD [facility] None  08/10/2020 11:15 AM Quay Burow, Claudina Lick, MD [facility] None    No orders of the defined types were placed in this encounter.     -------------------------------

## 2020-03-06 NOTE — Telephone Encounter (Signed)
Spoke with patient husband - Decrease Zoloft to 50mg  and call office in a week. Plan to D/C Zoloft and increase Remeron to 15mg  at hs.

## 2020-03-06 NOTE — Progress Notes (Signed)
[place]   [phone] [phone]   Clinic Follow up Note   Patient Care Team: Binnie Rail, MD as PCP - General (Internal Medicine) Nahser, Wonda Cheng, MD as PCP - Cardiology (Cardiology) Michael Boston, MD as Consulting Physician (General Surgery) Milus Banister, MD as Attending Physician (Gastroenterology) Nahser, Wonda Cheng, MD as Consulting Physician (Cardiology) Alda Berthold, DO as Consulting Physician (Neurology) Feliz Beam, MD as Referring Physician (Ophthalmology) Alda Berthold, DO as Consulting Physician (Neurology)  I connected with Tamara Gregory on 03/09/2020 at  2:20 PM EDT by telephone visit and verified that I am speaking with the correct person using two identifiers.  I discussed the limitations, risks, security and privacy concerns of performing an evaluation and management service by telephone and the availability of in person appointments. I also discussed with the patient that there may be a patient responsible charge related to this service. The patient expressed understanding and agreed to proceed.   Other persons participating in the visit and their role in the encounter:  Her husband  Patient's location: Her car Provider's location: My Office  CHIEF COMPLAINT: F/u of anemia  SUMMARY OF ONCOLOGIC HISTORY: Oncology History Overview Note  Cancer Staging 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 Staging form: Colon and Rectum, AJCC 8th Edition - Pathologic stage from 03/20/2018: Stage IIA (pT3, pN0, cM0) - Signed by Truitt Merle, MD on 07/20/2019    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  03/17/2018 Initial Diagnosis   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   03/20/2018 Cancer Staging   Staging form: Colon and Rectum, AJCC 8th Edition - Pathologic stage from 03/20/2018: Stage IIA (pT3, pN0, cM0) - Signed by Truitt Merle, MD on 07/20/2019   03/05/2019 Initial Biopsy    Diagnosis Surgical [P], cecum mass - INVASIVE MODERATELY DIFFERENTIATED ADENOCARCINOMA. SEE NOTE.   03/18/2019 Pathology Results   Hemicolectomy  Diagnosis 1. Colon, segmental resection for tumor, right colon - INVASIVE COLORECTAL ADENOCARCINOMA, 7.6 CM. - TUMOR EXTENDS INTO SUBSEROSAL CONNECTIVE TISSUE. - MARGINS NOT INVOLVED. - THIRTY-FOUR LYMPH NODES WITH NO METASTATIC CARCINOMA IDENTIFIED (0/34). - THREE SEPARATE TUBULAR ADENOMAS. - BENIGN OVARIAN SEROUS CYSTADENOMA. 2. Skin , skin mass - SEBORRHEIC KERATOSIS. - NO EVIDENCE OF MALIGNANCY.      CURRENT THERAPY:  Oral iron BID  INTERVAL HISTORY:  Tamara Gregory is here for a follow up of anemia. She was last seen by me 7 months ago. She notes she is doing well. She had a hard time hearing so her husband was her historian of her care and recent health. She has improved ambulating with walker but not very mobile since breaking her hip in 2020. She saw Dr Quay Burow last month. Her appetite has recovered after recent decline. She also had recent satiety and stomach aches after eating sometimes. She is able to maintain her weight.     REVIEW OF SYSTEMS:   Constitutional: Denies fevers, chills or abnormal weight loss Eyes: Denies blurriness of vision Ears, nose, mouth, throat, and face: Denies mucositis or sore throat Respiratory: Denies cough, dyspnea or wheezes Cardiovascular: Denies palpitation, chest discomfort or lower extremity swelling Gastrointestinal:  Denies nausea, heartburn or change in bowel habits (+) Occasional post prandial stomach ache and satiety Skin: Denies abnormal skin rashes Lymphatics: Denies new lymphadenopathy or easy bruising Neurological:Denies numbness, tingling or new weaknesses Behavioral/Psych: Mood is stable, no new changes  All other systems were reviewed with the patient  and are negative.  MEDICAL HISTORY:  Past Medical History:  Diagnosis Date   Anemia    Anxiety    Arthritis    Cancer  ([place])    hx of skin cancer , colon    Cataract    removed both eyes    Chronic back pain    okay with sitting, increased back pain with activity    Depression    GERD (gastroesophageal reflux disease)    Glaucoma    Headache(784.0)    hx of   History of kidney stones    History of migraines    Hyperlipidemia    Hypertension    Macular degeneration    Myasthenia gravis ([place])    Occasional tremors    Pneumonia    Spinal stenosis    Thoracic aneurysm    4.4cm; see CT done 01/28/12 and 01/22/13 in [facility].    Uses hearing aid    Vitamin D deficiency     SURGICAL HISTORY: Past Surgical History:  Procedure Laterality Date   ABDOMINAL HYSTERECTOMY     CHOLECYSTECTOMY     COLON SURGERY     Right colectomy dr. gross 03-17-18   HIP ARTHROPLASTY Left 03/11/2019   Procedure: ARTHROPLASTY BIPOLAR HIP (HEMIARTHROPLASTY);  Surgeon: Renette Butters, MD;  Location: [facility];  Service: Orthopedics;  Laterality: Left;   LUMBAR LAMINECTOMY/DECOMPRESSION MICRODISCECTOMY N/A 02/04/2013   Procedure: CENTRAL DECOMPRESSION/LUMBAR LAMINECTOMY L3-L4 AND L4-L5 2 LEVELS;  Surgeon: Tobi Bastos, MD;  Location: [facility];  Service: Orthopedics;  Laterality: N/A;   SPINAL CORD STIMULATOR IMPLANT     in right hip but not currently using because it did not help   TONSILLECTOMY     UPPER GASTROINTESTINAL ENDOSCOPY     US ECHOCARDIOGRAPHY  05/18/2007   EF 55-60%    I have reviewed the social history and family history with the patient and they are unchanged from previous note.  ALLERGIES:  is allergic to tramadol.  MEDICATIONS:  Current Outpatient Medications  Medication Sig Dispense Refill   acetaminophen (TYLENOL) 325 MG tablet Take 2 tablets (650 mg total) by mouth every 6 (six) hours as needed for headache.     aspirin 81 MG chewable tablet Chew 0.5 tablets (40.5 mg total) by mouth daily. 30 tablet 0   brimonidine (ALPHAGAN) 0.2 % ophthalmic solution Place 1 drop into both  eyes 3 (three) times daily.      Cholecalciferol (VITAMIN D) 125 MCG (5000 UT) CAPS Take 5,000 Units by mouth daily.      ferrous sulfate 325 (65 FE) MG tablet Take 325 mg by mouth 2 (two) times daily with a meal.     fluticasone (FLONASE) 50 MCG/ACT nasal spray Place 1 spray into both nostrils at bedtime.     furosemide (LASIX) 20 MG tablet Take 2 tablets (40 mg total) by mouth daily. 90 tablet 3   latanoprost (XALATAN) 0.005 % ophthalmic solution Place 1 drop into the left eye at bedtime.  1   levothyroxine (SYNTHROID) 25 MCG tablet Take 1 tablet (25 mcg total) by mouth daily before breakfast. Need office visit for more refills. 90 tablet 0   loperamide (IMODIUM) 2 MG capsule Take 1 capsule (2 mg total) by mouth daily. 90 capsule 3   losartan (COZAAR) 25 MG tablet Take 1 tablet (25 mg total) by mouth daily. 90 tablet 3   mirtazapine (REMERON) 7.5 MG tablet Take 1 tablet (7.5 mg total) by mouth at bedtime. 90 tablet 1   Multiple  Vitamins-Minerals (PRESERVISION AREDS) CAPS Take 1 capsule by mouth 2 (two) times daily.      MYRBETRIQ 25 MG TB24 tablet TAKE 1 TABLET BY MOUTH  DAILY NEED OFFICE VISIT FOR MORE REFILLS 90 tablet 1   Omega-3 Fatty Acids (FISH OIL) 435 MG CAPS Take 435 mg by mouth daily.     omeprazole (PRILOSEC) 20 MG capsule Take 1 capsule (20 mg total) by mouth daily. 90 capsule 3   potassium chloride SA (KLOR-CON) 20 MEQ tablet Dissolve 1 tablet daily in a small amount of water (may take 2 min to dissolve) then add to a small amount of apple sauce or pudding and eat right away 90 tablet 3   predniSONE (DELTASONE) 5 MG tablet Take 10 mg by mouth daily with breakfast.     rosuvastatin (CRESTOR) 10 MG tablet Take 1 tablet (10 mg total) by mouth daily. 90 tablet 3   sertraline (ZOLOFT) 100 MG tablet TAKE 1 AND 1/2 TABLETS BY  MOUTH AT BEDTIME 135 tablet 3   No current facility-administered medications for this visit.    PHYSICAL EXAMINATION: ECOG PERFORMANCE STATUS: 3  - Symptomatic, >50% confined to bed  No vitals taken today, Exam not performed today  LABORATORY DATA:  I have reviewed the data as listed CBC Latest Ref Rng & Units 02/08/2020 08/30/2019 08/16/2019  WBC 4.0 - 10.5 K/uL 7.0 9.6 7.0  Hemoglobin 12.0 - 15.0 g/dL 12.1 12.3 11.8(L)  Hematocrit 36.0 - 46.0 % 36.9 39.3 36.2  Platelets 150.0 - 400.0 K/uL 266.0 207 208.0     CMP Latest Ref Rng & Units 02/08/2020 11/17/2019 07/20/2019  Glucose 70 - 99 mg/dL 129(H) 247(H) 124(H)  BUN 6 - 23 mg/dL 24(H) 30 17  Creatinine 0.40 - 1.20 mg/dL 0.80 0.92 0.77  Sodium 135 - 145 mEq/L 142 143 141  Potassium 3.5 - 5.1 mEq/L 4.6 4.6 4.4  Chloride 96 - 112 mEq/L 104 100 104  CO2 19 - 32 mEq/L 32 23 26  Calcium 8.4 - 10.5 mg/dL 9.4 10.1 10.0  Total Protein 6.0 - 8.3 g/dL 7.0 6.4 7.3  Total Bilirubin 0.2 - 1.2 mg/dL 0.5 0.4 0.4  Alkaline Phos 39 - 117 U/L 76 64 68  AST 0 - 37 U/L 21 23 22   ALT 0 - 35 U/L 17 25 17       RADIOGRAPHIC STUDIES: I have personally reviewed the radiological images as listed and agreed with the findings in the report. No results found.   ASSESSMENT & PLAN:  Tamara Gregory is a 86 y.o. female with    1.Iron deficientAnemia  -She has had hg in 8-11range since 2018. Her CBC was normal in 10/2016. Normal MCV. Ferritin was low at 8.1 in 12/2017, and 19 in 06/2019 with low serum iron and saturation. She has lab evidence of iron deficiency. B12 was normal in 06/2019.  -she was diagnosed with right colon cancer in 03/2018, her iron deficient anemia is likely related to her colon cancer. However since her colon surgery, it has been due to inadequate iron supplement -She has been on oral iron ferous sulfate BID.  -Based on labs with her PCP last month her anemia has resolved and iron level normal. She is clinically doing better.  -Continue oral iron BID.  -F/u in 6 months   2. H/orightcolon cancerin 03/2018, pT3N0Mo stage IIA -Diagnosed in 03/2018, invasive moderate differentiated  adenocarcinoma. She is s/p hemicolectomy on 03/18/19.  -Given early stage cancer she did not need adjuvant chemo.  -  She had a recent bout of lower appetite but has recovered. She has occasional post prandial stomach ache and satiety. She is able to maintain weight.  -Continue colon cancer surveillance. Due to her advanced age, and early stage disease, I do not plan to have routine surveillance CT scan.  3. H/o skin cancer  -She was diagnosed with Squamous cell carcinoma of left forearm in 10/2016, ofright foot in 04/2018 and of upper back in 12/2018. All were removed.  -She will continue to f/u with her dermatologist  4. Chronic back pain  -She had pain pump placed before.  -Pain is controlled. Not mentioned today, possible resolved.   5. Social Support  -She does not take care of herself independently. She lives with her husband who takes care of her and is her primary historian.  -She broke her left hip in 03/2019 and had surgery. She now ambulates with walker, improving.    PLAN: -recent lab reviewed, anemia and iron deficiency resolved now.  -Lab and f/u in 6 months  -Continue oral iron ferrous sulfate BID.    No problem-specific Assessment & Plan notes found for this encounter.   No orders of the defined types were placed in this encounter.  I discussed the assessment and treatment plan with the patient. The patient was provided an opportunity to ask questions and all were answered. The patient agreed with the plan and demonstrated an understanding of the instructions.  The patient was advised to call back or seek an in-person evaluation if the symptoms worsen or if the condition fails to improve as anticipated.  The total time spent in the appointment was 15 minutes.     Truitt Merle, MD 03/09/2020   I, Joslyn Devon, am acting as scribe for Truitt Merle, MD.   I have reviewed the above documentation for accuracy and completeness, and I agree with the above.

## 2020-03-08 ENCOUNTER — Other Ambulatory Visit: Payer: Self-pay

## 2020-03-08 ENCOUNTER — Encounter: Payer: Self-pay | Admitting: Neurology

## 2020-03-08 ENCOUNTER — Telehealth (INDEPENDENT_AMBULATORY_CARE_PROVIDER_SITE_OTHER): Payer: Medicare Other | Admitting: Neurology

## 2020-03-08 DIAGNOSIS — G7000 Myasthenia gravis without (acute) exacerbation: Secondary | ICD-10-CM | POA: Diagnosis not present

## 2020-03-08 DIAGNOSIS — Z7952 Long term (current) use of systemic steroids: Secondary | ICD-10-CM

## 2020-03-08 NOTE — Progress Notes (Signed)
   Due to the COVID-19 crisis, this virtual check-in visit was done via telephone from my office and it was initiated and consent given by this patient and or family.  Telephone (Audio) Visit The purpose of this telephone visit is to provide medical care while limiting exposure to the novel coronavirus.    Consent was obtained for telephone visit and initiated by pt/family:  Yes.   Answered questions that patient had about telehealth interaction:  Yes.   I discussed the limitations, risks, security and privacy concerns of performing an evaluation and management service by telephone. I also discussed with the patient that there may be a patient responsible charge related to this service. The patient expressed understanding and agreed to proceed.  Pt location: Home Physician Location: office Name of referring provider:  Binnie Rail, MD I connected with .[name] at patients initiation/request on 03/08/2020 at  2:15 PM EDT by telephone and verified that I am speaking with the correct person using two identifiers.  Pt MRN:  [id number] Pt DOB:  04/18/1925  Assessment/Plan:  Ocular myasthenia gravis with exacerbation (diagnosed 2013, [facility])  - Previously on prednisone 30mg  daily during the fall of 2020 without any improvement in ptosis or diplopia, so tapered back down to prednisone 10mg  daily without exacerbation of symptoms.  Would not recommend any higher dose for purely ocular symptoms given corticosteroid side effects and patient's advanced osteoporosis.  We have mutually decided not to escalate therapy with steroid-sparing agent as she is tolerating symptoms with wearing an eye patch  - No benefit with mestinon, so it was discontinued at her last visit  - Continue prednisone 10mg  daily  - If symptoms get worse during the summer months, OK to increase to prednisone 20mg  daily   Subjective:  She is has done well on prednisone 10mg  without worsening double vision or ptosis.  She  continues to wear eye patch will helps. She was evaluated by ophthalmology who may offer a trial of prisms to see if that provides any relief.  Her appointment with them is in May.  No new neurological complaints.     Follow Up Instructions:     I discussed the assessment and treatment plan with the patient. The patient was provided an opportunity to ask questions and all were answered. The patient agreed with the plan and demonstrated an understanding of the instructions.   The patient was advised to call back or seek an in-person evaluation if the symptoms worsen or if the condition fails to improve as anticipated.  Return to clinic in 4 months  Total Time spent in visit with the patient was:  5 min, of which 100% of the time was spent in counseling and/or coordinating care.   Pt understands and agrees with the plan of care outlined.     Alda Berthold, DO

## 2020-03-09 ENCOUNTER — Telehealth (INDEPENDENT_AMBULATORY_CARE_PROVIDER_SITE_OTHER): Payer: Medicare Other | Admitting: Neurology

## 2020-03-09 ENCOUNTER — Inpatient Hospital Stay: Payer: Medicare Other

## 2020-03-09 ENCOUNTER — Encounter: Payer: Self-pay | Admitting: Hematology

## 2020-03-09 ENCOUNTER — Inpatient Hospital Stay: Payer: Medicare Other | Attending: Hematology | Admitting: Hematology

## 2020-03-09 VITALS — Ht 62.0 in | Wt 150.0 lb

## 2020-03-09 DIAGNOSIS — Z5329 Procedure and treatment not carried out because of patient's decision for other reasons: Secondary | ICD-10-CM

## 2020-03-09 DIAGNOSIS — C180 Malignant neoplasm of cecum: Secondary | ICD-10-CM | POA: Diagnosis not present

## 2020-03-10 ENCOUNTER — Telehealth: Payer: Self-pay | Admitting: Hematology

## 2020-03-10 NOTE — Telephone Encounter (Signed)
Scheduled appt per 4/8 los.  Spoke with pt spouse and he is aware of the scheduled appt date and time.

## 2020-03-10 NOTE — Progress Notes (Signed)
Rescheduled visit.

## 2020-03-16 ENCOUNTER — Telehealth: Payer: Self-pay | Admitting: Adult Health

## 2020-03-16 NOTE — Telephone Encounter (Signed)
Pt husband Ray called and ask Deloria Lair to return his call. He is to report how pt is doing and provider is adjusting her meds.  Stated needs to talk with Rollene Fare. CONTACT # [phone]

## 2020-03-16 NOTE — Telephone Encounter (Signed)
Can you follow up pls?

## 2020-03-16 NOTE — Telephone Encounter (Signed)
Noted  

## 2020-03-27 ENCOUNTER — Ambulatory Visit: Payer: Medicare Other | Admitting: Cardiovascular Disease

## 2020-03-27 ENCOUNTER — Ambulatory Visit
Admission: RE | Admit: 2020-03-27 | Discharge: 2020-03-27 | Disposition: A | Discharge status: Home / Self Care | Payer: Medicare Other | Source: Ambulatory Visit | Attending: Cardiovascular Disease | Admitting: Cardiovascular Disease

## 2020-03-27 ENCOUNTER — Other Ambulatory Visit: Payer: Self-pay

## 2020-03-27 ENCOUNTER — Telehealth: Payer: Self-pay

## 2020-03-27 ENCOUNTER — Encounter: Payer: Self-pay | Admitting: Cardiovascular Disease

## 2020-03-27 VITALS — BP 112/74 | HR 72 | Ht 62.0 in | Wt 155.0 lb

## 2020-03-27 DIAGNOSIS — Z79899 Other long term (current) drug therapy: Secondary | ICD-10-CM | POA: Diagnosis not present

## 2020-03-27 DIAGNOSIS — R0602 Shortness of breath: Secondary | ICD-10-CM | POA: Diagnosis not present

## 2020-03-27 DIAGNOSIS — E039 Hypothyroidism, unspecified: Secondary | ICD-10-CM | POA: Diagnosis not present

## 2020-03-27 MED ORDER — FUROSEMIDE 40 MG PO TABS: 40.0000 mg | ORAL_TABLET | Freq: Two times a day (BID) | ORAL | 3 refills | Status: DC

## 2020-03-27 MED ORDER — POTASSIUM CHLORIDE CRYS ER 20 MEQ PO TBCR: 20.0000 meq | EXTENDED_RELEASE_TABLET | Freq: Two times a day (BID) | ORAL | 3 refills | Status: DC

## 2020-03-27 NOTE — Telephone Encounter (Signed)
Spoke with the patients spouse who states the patient has had worsening SOB over the past couple of weeks. It is not effecting the patient with exertion as well as while at rest. Pt would like to be seen in office as soon as possible. Denies CP, swelling or weight gain. Pt has been compliant with all medications. Scheduled on Nish[facility] DOD open slot this afternoon.

## 2020-03-27 NOTE — Patient Instructions (Addendum)
Medication Instructions:  Your physician has recommended you make the following change in your medication:  1-Increase Lasix (furosemide) 40 mg by mouth twice daily 2-Increase Potassium 20 meq by mouth twice daily  *If you need a refill on your cardiac medications before your next appointment, please call your pharmacy*  Lab Work: Your physician recommends that you return for lab work in: Hematocrit  Hemoglobin, TSH, BNP, CMET  If you have labs (blood work) drawn today and your tests are completely normal, you will receive your results only by: [name] MyChart Message (if you have MyChart) OR . A paper copy in the mail If you have any lab test that is abnormal or we need to change your treatment, we will call you to review the results.  Testing/Procedures: A chest x-ray takes a picture of the organs and structures inside the chest, including the heart, lungs, and blood vessels. This test can show several things, including, whether the heart is enlarges; whether fluid is building up in the lungs; and whether pacemaker / defibrillator leads are still in place.  Follow-Up: At [facility], you and your health needs are our priority.  As part of our continuing mission to provide you with exceptional heart care, we have created designated Provider Care Teams.  These Care Teams include your primary Cardiologist (physician) and Advanced Practice Providers (APPs -  Physician Assistants and Nurse Practitioners) who all work together to provide you with the care you need, when you need it.  We recommend signing up for the patient portal called "MyChart".  Sign up information is provided on this After Visit Summary.  MyChart is used to connect with patients for Virtual Visits (Telemedicine).  Patients are able to view lab/test results, encounter notes, upcoming appointments, etc.  Non-urgent messages can be sent to your provider as well.   To learn more about what you can do with MyChart, go to  [email or web address].    Your next appointment:   2 weeks  The format for your next appointment:   In Person  Provider:   You may see Mertie Moores, MD or one of the following Advanced Practice Providers on your designated Care Team:    Richardson Dopp, PA-C  [place], [place]  Daune Perch, [place]

## 2020-03-27 NOTE — Progress Notes (Signed)
CARDIOLOGY CONSULT NOTE       Patient ID: HAELEIGH YAMADA MRN: [id number] DOB/AGE: July 01, 1925 86 y.o.  Primary Physician: Binnie Rail, MD Primary Cardiologist: Nahser  History:  86 y.o. patient of Dr Acie Fredrickson. Added on to my DOD schedule for dyspnea. She has a history of Myesthenia,  HTN, HLD, colon cancer with robotic right colectomy April 2019  She has had chronic anemia since then  Last Hct 36.9 on 02/08/20 She has been less ambulatory since breaking her hip last year  June 2020 she had dyspnea and echo showed severe AS with EF 35-40% with apical aneurysm  and aortic root of 4.4 cm  Mean gradient is 24 peak 42 mmHg DVI 0.23 and AVA 0.7 cm2  Only Rx has been diuretic thought was that she had perioperative MI at time of her hip fracture in April 2020  Given age and frailty not thought to be a candidate for ischemic evaluation or TAVR  She has had diploplia with her myasthenia   She has been having classic PND/Orthopnea for 2 weeks and more difficulty laying flat to sleep She has only been taking her lasix daily as she doesn't like to urinate all day  In office her sats were 90% and dropped to 85% with ambulation   No chest pain / palpitations Chronic LE venous stasis but no real edema   They will celebrate 24 years of marriage end of this month  He is a retired Pharmacist, community They have a farm on [place] All other systems reviewed and negative except as noted above  Past Medical History:  Diagnosis Date  . Anemia   . Anxiety   . Arthritis   . Cancer (HCC)    hx of skin cancer , colon   . Cataract    removed both eyes   . Chronic back pain    okay with sitting, increased back pain with activity   . Depression   . GERD (gastroesophageal reflux disease)   . Glaucoma   . Headache(784.0)    hx of  . History of kidney stones   . History of migraines   . Hyperlipidemia   . Hypertension   . Macular degeneration   . Myasthenia gravis ([place])   . Occasional tremors   .  Pneumonia   . Spinal stenosis   . Thoracic aneurysm    4.4cm; see CT done 01/28/12 and 01/22/13 in [facility].   [name] Uses hearing aid   . Vitamin D deficiency     Family History  Problem Relation Age of Onset  . Heart attack Father   . Diabetes Father   . Cancer Sister   . Breast cancer Sister   . Cancer Brother   . Lung cancer Brother   . Cancer Sister   . Liver cancer Sister   . Diabetes Sister   . Healthy Son   . Colon polyps Neg Hx   . Colon cancer Neg Hx   . Esophageal cancer Neg Hx   . Rectal cancer Neg Hx   . Stomach cancer Neg Hx   . Adrenal disorder Neg Hx     Social History   Socioeconomic History  . Marital status: Married    Spouse name: Not on file  . Number of children: 3  . Years of education: Not on file  . Highest education level: High school graduate  Occupational History  . Occupation: retired  Tobacco Use  . Smoking status: Never  Smoker  . Smokeless tobacco: Never Used  Substance and Sexual Activity  . Alcohol use: No  . Drug use: No  . Sexual activity: Not Currently  Other Topics Concern  . Not on file  Social History Narrative   Lives with husband in a one story home.  Has 2 sons.  Retired.  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  Education: college.  [employer].       Right handed    Social Determinants of Health   Financial Resource Strain:   . Difficulty of Paying Living Expenses:   Food Insecurity:   . Worried About [name] fundraiser in the Last Year:   . Arboriculturist in the Last Year:   Transportation Needs:   . Film/video editor (Medical):   [name] Lack of Transportation (Non-Medical):   Physical Activity:   . Days of Exercise per Week:   . Minutes of Exercise per Session:   Stress:   . Feeling of Stress :   Social Connections:   . Frequency of Communication with Friends and Family:   . Frequency of Social Gatherings with Friends and Family:   . Attends Religious Services:   . Active Member of Clubs or  Organizations:   . Attends Archivist Meetings:   [name] Marital Status:   Intimate Partner Violence:   . Fear of Current or Ex-Partner:   . Emotionally Abused:   [name] Physically Abused:   . Sexually Abused:     Past Surgical History:  Procedure Laterality Date  . ABDOMINAL HYSTERECTOMY    . CHOLECYSTECTOMY    . COLON SURGERY     Right colectomy dr. gross 03-17-18  . HIP ARTHROPLASTY Left 03/11/2019   Procedure: ARTHROPLASTY BIPOLAR HIP (HEMIARTHROPLASTY);  Surgeon: Renette Butters, MD;  Location: [place];  Service: Orthopedics;  Laterality: Left;  . LUMBAR LAMINECTOMY/DECOMPRESSION MICRODISCECTOMY N/A 02/04/2013   Procedure: CENTRAL DECOMPRESSION/LUMBAR LAMINECTOMY L3-L4 AND L4-L5 2 LEVELS;  Surgeon: Tobi Bastos, MD;  Location: [facility];  Service: Orthopedics;  Laterality: N/A;  . SPINAL CORD STIMULATOR IMPLANT     in right hip but not currently using because it did not help  . TONSILLECTOMY    . UPPER GASTROINTESTINAL ENDOSCOPY    . US ECHOCARDIOGRAPHY  05/18/2007   EF 55-60%      Current Outpatient Medications:  .  acetaminophen (TYLENOL) 325 MG tablet, Take 2 tablets (650 mg total) by mouth every 6 (six) hours as needed for headache., Disp: , Rfl:  .  aspirin 81 MG chewable tablet, Chew 0.5 tablets (40.5 mg total) by mouth daily., Disp: 30 tablet, Rfl: 0 .  brimonidine (ALPHAGAN) 0.2 % ophthalmic solution, Place 1 drop into both eyes 3 (three) times daily. , Disp: , Rfl:  .  Cholecalciferol (VITAMIN D) 125 MCG (5000 UT) CAPS, Take 5,000 Units by mouth daily. , Disp: , Rfl:  .  ferrous sulfate 325 (65 FE) MG tablet, Take 325 mg by mouth 2 (two) times daily with a meal., Disp: , Rfl:  .  fluticasone (FLONASE) 50 MCG/ACT nasal spray, Place 1 spray into both nostrils at bedtime., Disp: , Rfl:  .  furosemide (LASIX) 20 MG tablet, Take 2 tablets (40 mg total) by mouth daily., Disp: 90 tablet, Rfl: 3 .  latanoprost (XALATAN) 0.005 % ophthalmic solution, Place 1 drop into the left eye  at bedtime., Disp: , Rfl: 1 .  levothyroxine (SYNTHROID) 25 MCG tablet, Take 1  tablet (25 mcg total) by mouth daily before breakfast. Need office visit for more refills., Disp: 90 tablet, Rfl: 0 .  loperamide (IMODIUM) 2 MG capsule, Take 1 capsule (2 mg total) by mouth daily., Disp: 90 capsule, Rfl: 3 .  losartan (COZAAR) 25 MG tablet, Take 1 tablet (25 mg total) by mouth daily., Disp: 90 tablet, Rfl: 3 .  mirtazapine (REMERON) 7.5 MG tablet, Take 1 tablet (7.5 mg total) by mouth at bedtime., Disp: 90 tablet, Rfl: 1 .  Multiple Vitamins-Minerals (PRESERVISION AREDS) CAPS, Take 1 capsule by mouth 2 (two) times daily. , Disp: , Rfl:  .  MYRBETRIQ 25 MG TB24 tablet, TAKE 1 TABLET BY MOUTH  DAILY NEED OFFICE VISIT FOR MORE REFILLS, Disp: 90 tablet, Rfl: 1 .  Omega-3 Fatty Acids (FISH OIL) 435 MG CAPS, Take 435 mg by mouth daily., Disp: , Rfl:  .  omeprazole (PRILOSEC) 20 MG capsule, Take 1 capsule (20 mg total) by mouth daily., Disp: 90 capsule, Rfl: 3 .  potassium chloride SA (KLOR-CON) 20 MEQ tablet, Dissolve 1 tablet daily in a small amount of water (may take 2 min to dissolve) then add to a small amount of apple sauce or pudding and eat right away, Disp: 90 tablet, Rfl: 3 .  predniSONE (DELTASONE) 5 MG tablet, Take 10 mg by mouth daily with breakfast., Disp: , Rfl:  .  rosuvastatin (CRESTOR) 10 MG tablet, Take 1 tablet (10 mg total) by mouth daily., Disp: 90 tablet, Rfl: 3 .  sertraline (ZOLOFT) 100 MG tablet, TAKE 1 AND 1/2 TABLETS BY  MOUTH AT BEDTIME, Disp: 135 tablet, Rfl: 3    Physical Exam: There were no vitals taken for this visit.   Affect appropriate Frail elderly female  HEENT: Myasthenia diplopia with patch on  Neck supple with no adenopathy JVP elevated no bruits no thyromegaly Lungs clear with no wheezing and good diaphragmatic motion Heart:  S1/S2 AS  murmur, no rub, gallop or click PMI enlarged Back stimulator implant  Abdomen: benighn, BS positve, no tenderness, no AAA no  bruit.  No HSM or HJR Distal pulses intact with no bruits LE varicosities with stasis  Neuro non-focal Skin warm and dry Post left hip fracture    Labs:   Lab Results  Component Value Date   WBC 7.0 02/08/2020   HGB 12.1 02/08/2020   HCT 36.9 02/08/2020   MCV 100.8 (H) 02/08/2020   PLT 266.0 02/08/2020   No results for input(s): NA, K, CL, CO2, BUN, CREATININE, CALCIUM, PROT, BILITOT, ALKPHOS, ALT, AST, GLUCOSE in the last 168 hours.  Invalid input(s): LABALBU Lab Results  Component Value Date   CKTOTAL 42 01/26/2013   CKMB 2.4 01/26/2013   TROPONINI <0.30 01/27/2013    Lab Results  Component Value Date   CHOL 166 02/08/2020   CHOL 232 (H) 11/17/2019   CHOL 157 11/19/2017   Lab Results  Component Value Date   HDL 63.40 02/08/2020   HDL 89 11/17/2019   HDL 67.20 11/19/2017   Lab Results  Component Value Date   LDLCALC 81 02/08/2020   LDLCALC 103 (H) 11/17/2019   LDLCALC 61 11/19/2017   Lab Results  Component Value Date   TRIG 108.0 02/08/2020   TRIG 241 (H) 11/17/2019   TRIG 142.0 11/19/2017   Lab Results  Component Value Date   CHOLHDL 3 02/08/2020   CHOLHDL 2.6 11/17/2019   CHOLHDL 2 11/19/2017   Lab Results  Component Value Date   LDLDIRECT 139.3 10/04/2013  LDLDIRECT 117.4 09/05/2011      Radiology: No results found.  EKG: SR rate 23 old anterior MI 04/30/19    ASSESSMENT AND PLAN:   1. Acute on chronic systolic CHF:   MI perioperative post hip surgery in April 2020 EF 35-40% has only been on diuretic with no ARB/Entresto and no beta blocker For now will increase her Lasix/K to bid Check CXR and labs to include K/CR/BNP/Hct   F/U PA/MD 2 weeks and if systolic BP over [id number] mmgh add low dose lisinopril 2.5 mg daily  Since her sats are low will try to get her 2L's oxygen at night She may benefit from low dose valium to help her sleep as well   2.  CAD :  has never had heart cath given age observe no chest pain   3.  AS _-severe low flow low  gradient AS  She does not appear terribly volume overloaded and suspect her dyspnea is high wedge low output. She needs a wheel chair when not in house and would agree that she is not a TAVR candidate   4. Myasthenia gravis followed at [facility] biggest issue is diplopia   5. Past history of spinal stenosis with chronic low back pain. Implantation of spinal cord stimulator at [facility] on 01/27/14   6.  Frequent falls and poor balance  7. History of fusiform aneurysm of ascending aorta 4 cm -  Observe  Discussed changes and care with Dr Elmarie Shiley nurse  F/u 2 weeks PA/Nahser Labs  CXR Oxygen Lasix bid    Signed: Jenkins Rouge 03/27/2020, 1:00 PM

## 2020-03-28 ENCOUNTER — Telehealth: Payer: Self-pay | Admitting: Nurse Practitioner

## 2020-03-28 LAB — COMPREHENSIVE METABOLIC PANEL
ALT: 17 IU/L (ref 0–32)
AST: 19 IU/L (ref 0–40)
Albumin/Globulin Ratio: 2.3 — ABNORMAL HIGH (ref 1.2–2.2)
Albumin: 4.3 g/dL (ref 3.5–4.6)
Alkaline Phosphatase: 55 IU/L (ref 39–117)
BUN/Creatinine Ratio: 28 (ref 12–28)
BUN: 23 mg/dL (ref 10–36)
Bilirubin Total: 0.5 mg/dL (ref 0.0–1.2)
CO2: 27 mmol/L (ref 20–29)
Calcium: 10.3 mg/dL (ref 8.7–10.3)
Chloride: 104 mmol/L (ref 96–106)
Creatinine, Ser: 0.82 mg/dL (ref 0.57–1.00)
GFR calc Af Amer: 71 mL/min/{1.73_m2} (ref >59)
GFR calc non Af Amer: 62 mL/min/{1.73_m2} (ref >59)
Globulin, Total: 1.9 g/dL (ref 1.5–4.5)
Glucose: 117 mg/dL — ABNORMAL HIGH (ref 65–99)
Potassium: 4.5 mmol/L (ref 3.5–5.2)
Sodium: 145 mmol/L — ABNORMAL HIGH (ref 134–144)
Total Protein: 6.2 g/dL (ref 6.0–8.5)

## 2020-03-28 LAB — TSH: TSH: 1.74 u[IU]/mL (ref 0.450–4.500)

## 2020-03-28 LAB — HEMOGLOBIN: Hemoglobin: 11.7 g/dL (ref 11.1–15.9)

## 2020-03-28 LAB — PRO B NATRIURETIC PEPTIDE: NT-Pro BNP: 3893 pg/mL — ABNORMAL HIGH (ref 0–738)

## 2020-03-28 LAB — HEMATOCRIT: Hematocrit: 35.4 % (ref 34.0–46.6)

## 2020-03-28 NOTE — Telephone Encounter (Signed)
Called patient's husband to follow-up from patient's visit with Dr. Johnsie Cancel yesterday. I advised that all of the required parameters for home O2 therapy were not collected and apologized for the inconvenience. I offered a sooner return appointment with Dr. Acie Fredrickson or to reach out to patient's PCP for help. Patient's husband states they are supposed to come back to see Dr. Acie Fredrickson anyway so they will just come in here for follow-up and to get the information required to order O2. I moved the patient's appointment from 5/14 to 5/4. I also reviewed chest x-ray and lab results with husband and advised no change in current plan of treatment. He thanked me for the call.

## 2020-03-30 ENCOUNTER — Other Ambulatory Visit: Payer: Self-pay

## 2020-03-30 ENCOUNTER — Ambulatory Visit (INDEPENDENT_AMBULATORY_CARE_PROVIDER_SITE_OTHER): Payer: Medicare Other | Admitting: Otolaryngology

## 2020-03-30 VITALS — Temp 97.2°F

## 2020-03-30 DIAGNOSIS — H903 Sensorineural hearing loss, bilateral: Secondary | ICD-10-CM

## 2020-03-30 DIAGNOSIS — H6123 Impacted cerumen, bilateral: Secondary | ICD-10-CM | POA: Diagnosis not present

## 2020-03-30 NOTE — Progress Notes (Signed)
HPI: Tamara Gregory is a 86 y.o. female who presents for evaluation of cerumen buildup in both ears.  She has decreased hearing in both ears and tried hearing aids previously but did not tolerate these well.  She is thinking about trying hearing aids again..  Past Medical History:  Diagnosis Date  . Anemia   . Anxiety   . Arthritis   . Cancer (HCC)    hx of skin cancer , colon   . Cataract    removed both eyes   . Chronic back pain    okay with sitting, increased back pain with activity   . Depression   . GERD (gastroesophageal reflux disease)   . Glaucoma   . Headache(784.0)    hx of  . History of kidney stones   . History of migraines   . Hyperlipidemia   . Hypertension   . Macular degeneration   . Myasthenia gravis ([place])   . Occasional tremors   . Pneumonia   . Spinal stenosis   . Thoracic aneurysm    4.4cm; see CT done 01/28/12 and 01/22/13 in [facility].   [name] Uses hearing aid   . Vitamin D deficiency    Past Surgical History:  Procedure Laterality Date  . ABDOMINAL HYSTERECTOMY    . CHOLECYSTECTOMY    . COLON SURGERY     Right colectomy dr. gross 03-17-18  . HIP ARTHROPLASTY Left 03/11/2019   Procedure: ARTHROPLASTY BIPOLAR HIP (HEMIARTHROPLASTY);  Surgeon: Renette Butters, MD;  Location: [place];  Service: Orthopedics;  Laterality: Left;  . LUMBAR LAMINECTOMY/DECOMPRESSION MICRODISCECTOMY N/A 02/04/2013   Procedure: CENTRAL DECOMPRESSION/LUMBAR LAMINECTOMY L3-L4 AND L4-L5 2 LEVELS;  Surgeon: Tobi Bastos, MD;  [facility];  Service: Orthopedics;  Laterality: N/A;  . SPINAL CORD STIMULATOR IMPLANT     in right hip but not currently using because it did not help  . TONSILLECTOMY    . UPPER GASTROINTESTINAL ENDOSCOPY    . US ECHOCARDIOGRAPHY  05/18/2007   EF 55-60%   Social History   Socioeconomic History  . Marital status: Married    Spouse name: Not on file  . Number of children: 3  . Years of education: Not on file  . Highest education level: High school  graduate  Occupational History  . Occupation: retired  Tobacco Use  . Smoking status: Never Smoker  . Smokeless tobacco: Never Used  Substance and Sexual Activity  . Alcohol use: No  . Drug use: No  . Sexual activity: Not Currently  Other Topics Concern  . Not on file  Social History Narrative   Lives with husband in a one story home.  Has 2 sons.  Retired.  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  Education: college.  [employer].       Right handed    Social Determinants of Health   Financial Resource Strain:   . Difficulty of Paying Living Expenses:   Food Insecurity:   . Worried About [name] fundraiser in the Last Year:   . Arboriculturist in the Last Year:   Transportation Needs:   . Film/video editor (Medical):   [name] Lack of Transportation (Non-Medical):   Physical Activity:   . Days of Exercise per Week:   . Minutes of Exercise per Session:   Stress:   . Feeling of Stress :   Social Connections:   . Frequency of Communication with Friends and Family:   . Frequency  of Social Gatherings with Friends and Family:   . Attends Religious Services:   . Active Member of Clubs or Organizations:   . Attends Archivist Meetings:   [name] Marital Status:    Family History  Problem Relation Age of Onset  . Heart attack Father   . Diabetes Father   . Cancer Sister   . Breast cancer Sister   . Cancer Brother   . Lung cancer Brother   . Cancer Sister   . Liver cancer Sister   . Diabetes Sister   . Healthy Son   . Colon polyps Neg Hx   . Colon cancer Neg Hx   . Esophageal cancer Neg Hx   . Rectal cancer Neg Hx   . Stomach cancer Neg Hx   . Adrenal disorder Neg Hx    Allergies  Allergen Reactions  . Tramadol Other (See Comments)    delirium   Prior to Admission medications   Medication Sig Start Date End Date Taking? Authorizing Provider  acetaminophen (TYLENOL) 325 MG tablet Take 2 tablets (650 mg total) by mouth every 6 (six)  hours as needed for headache. 03/29/19  Yes Angiulli, Lavon Paganini, PA-C  aspirin 81 MG chewable tablet Chew 0.5 tablets (40.5 mg total) by mouth daily. 03/20/18  Yes Stark Klein, MD  brimonidine (ALPHAGAN) 0.2 % ophthalmic solution Place 1 drop into both eyes 3 (three) times daily.    Yes [provider]  Cholecalciferol (VITAMIN D) 125 MCG (5000 UT) CAPS Take 5,000 Units by mouth daily.    Yes [provider]  fluticasone (FLONASE) 50 MCG/ACT nasal spray Place 1 spray into both nostrils at bedtime.   Yes [provider]  furosemide (LASIX) 40 MG tablet Take 1 tablet (40 mg total) by mouth 2 (two) times daily. 03/27/20  Yes Josue Hector, MD  latanoprost (XALATAN) 0.005 % ophthalmic solution Place 1 drop into the left eye at bedtime. 04/13/15  Yes [provider]  levothyroxine (SYNTHROID) 25 MCG tablet Take 1 tablet (25 mcg total) by mouth daily before breakfast. Need office visit for more refills. 01/14/20  Yes Burns, Claudina Lick, MD  losartan (COZAAR) 25 MG tablet Take 1 tablet (25 mg total) by mouth daily. 05/14/19  Yes Nahser, Wonda Cheng, MD  mirtazapine (REMERON) 7.5 MG tablet Take 1 tablet (7.5 mg total) by mouth at bedtime. 02/03/20  Yes Mozingo, Berdie Ogren, NP  Multiple Vitamins-Minerals (PRESERVISION AREDS) CAPS Take 1 capsule by mouth 2 (two) times daily.    Yes [provider]  MYRBETRIQ 25 MG TB24 tablet TAKE 1 TABLET BY MOUTH  DAILY NEED OFFICE VISIT FOR MORE REFILLS 02/16/20  Yes Burns, Claudina Lick, MD  Omega-3 Fatty Acids (FISH OIL) 435 MG CAPS Take 435 mg by mouth daily.   Yes [provider]  omeprazole (PRILOSEC) 20 MG capsule Take 1 capsule (20 mg total) by mouth daily. 12/08/19  Yes Burns, Claudina Lick, MD  potassium chloride SA (KLOR-CON) 20 MEQ tablet Take 1 tablet (20 mEq total) by mouth 2 (two) times daily. Dissolve tablet in a small amount of water (may take 2 min to dissolve) then add to a small amount of apple sauce or pudding and eat right  away 03/27/20  Yes Josue Hector, MD  predniSONE (DELTASONE) 5 MG tablet Take 10 mg by mouth daily with breakfast.   Yes [provider]  rosuvastatin (CRESTOR) 10 MG tablet Take 1 tablet (10 mg total) by mouth daily. 11/19/19 11/13/20  Yes Nahser, Wonda Cheng, MD     Positive ROS: Otherwise negative  All other systems have been reviewed and were otherwise negative with the exception of those mentioned in the HPI and as above.  Physical Exam: Constitutional: Alert, well-appearing, no acute distress Ears: External ears without lesions or tenderness. Ear canals were completely occluded with cerumen on both sides that was removed with curette and forceps.  TMs were clear bilaterally.. Nasal: External nose without lesions. Clear nasal passages Oral: Oropharynx clear. Neck: No palpable adenopathy or masses Respiratory: Breathing comfortably  Skin: No facial/neck lesions or rash noted.  Cerumen impaction removal  Date/Time: 03/30/2020 4:09 PM Performed by: Rozetta Nunnery, MD Authorized by: Rozetta Nunnery, MD   Consent:    Consent obtained:  Verbal   Consent given by:  Patient   Risks discussed:  Pain and bleeding Procedure details:    Location:  L ear and R ear   Procedure type: curette and forceps   Post-procedure details:    Inspection:  TM intact and canal normal   Hearing quality:  Improved   Patient tolerance of procedure:  Tolerated well, no immediate complications Comments:     TMs are clear bilaterally    Assessment: Bilateral cerumen impactions  Plan: This was cleaned in the office she will follow-up as needed  Radene Journey, MD

## 2020-04-02 ENCOUNTER — Other Ambulatory Visit: Payer: Self-pay | Admitting: Internal Medicine

## 2020-04-04 ENCOUNTER — Other Ambulatory Visit: Payer: Self-pay

## 2020-04-04 ENCOUNTER — Encounter: Payer: Self-pay | Admitting: Cardiovascular Disease

## 2020-04-04 ENCOUNTER — Ambulatory Visit: Payer: Medicare Other | Admitting: Cardiovascular Disease

## 2020-04-04 VITALS — BP 126/62 | HR 101 | Ht 62.0 in | Wt 154.1 lb

## 2020-04-04 DIAGNOSIS — I10 Essential (primary) hypertension: Secondary | ICD-10-CM

## 2020-04-04 DIAGNOSIS — I2510 Atherosclerotic heart disease of native coronary artery without angina pectoris: Secondary | ICD-10-CM | POA: Diagnosis not present

## 2020-04-04 DIAGNOSIS — R0602 Shortness of breath: Secondary | ICD-10-CM

## 2020-04-04 DIAGNOSIS — I350 Nonrheumatic aortic (valve) stenosis: Secondary | ICD-10-CM | POA: Diagnosis not present

## 2020-04-04 DIAGNOSIS — I5023 Acute on chronic systolic (congestive) heart failure: Secondary | ICD-10-CM

## 2020-04-04 DIAGNOSIS — I5043 Acute on chronic combined systolic (congestive) and diastolic (congestive) heart failure: Secondary | ICD-10-CM

## 2020-04-04 MED ORDER — POTASSIUM CHLORIDE CRYS ER 20 MEQ PO TBCR: 20.0000 meq | EXTENDED_RELEASE_TABLET | Freq: Once | ORAL | 3 refills | Status: DC

## 2020-04-04 MED ORDER — FUROSEMIDE 40 MG PO TABS: 40.0000 mg | ORAL_TABLET | Freq: Every day | ORAL | 3 refills | Status: DC

## 2020-04-04 NOTE — Patient Instructions (Addendum)
Medication Instructions:  Your physician has recommended you make the following change in your medication:  DECREASE Furosemide (Lasix) to 40 mg daily DECREASE Kdur (Potassium chloride) to 20 mEq daily  *If you need a refill on your cardiac medications before your next appointment, please call your pharmacy*   Lab Work: TODAY - basic metabolic panel If you have labs (blood work) drawn today and your tests are completely normal, you will receive your results only by: [name] MyChart Message (if you have MyChart) OR . A paper copy in the mail If you have any lab test that is abnormal or we need to change your treatment, we will call you to review the results.   Testing/Procedures: None Ordered   Follow-Up: At [facility], you and your health needs are our priority.  As part of our continuing mission to provide you with exceptional heart care, we have created designated Provider Care Teams.  These Care Teams include your primary Cardiologist (physician) and Advanced Practice Providers (APPs -  Physician Assistants and Nurse Practitioners) who all work together to provide you with the care you need, when you need it.  We recommend signing up for the patient portal called "MyChart".  Sign up information is provided on this After Visit Summary.  MyChart is used to connect with patients for Virtual Visits (Telemedicine).  Patients are able to view lab/test results, encounter notes, upcoming appointments, etc.  Non-urgent messages can be sent to your provider as well.   To learn more about what you can do with MyChart, go to [email or web address].    Your next appointment:   1 month(s) on Wednesday June 9 at 11:20 am  The format for your next appointment:   Either In Person or Virtual  Provider:   You may see Mertie Moores, MD or one of the following Advanced Practice Providers on your designated Care Team:    Richardson Dopp, PA-C  Vin Bhagat, PA-C   Low-Sodium Eating Plan Sodium, which  is an element that makes up salt, helps you maintain a healthy balance of fluids in your body. Too much sodium can increase your blood pressure and cause fluid and waste to be held in your body. Your health care provider or dietitian may recommend following this plan if you have high blood pressure (hypertension), kidney disease, liver disease, or heart failure. Eating less sodium can help lower your blood pressure, reduce swelling, and protect your heart, liver, and kidneys. What are tips for following this plan? General guidelines  Most people on this plan should limit their sodium intake to 1,500-2,000 mg (milligrams) of sodium each day. Reading food labels   The Nutrition Facts label lists the amount of sodium in one serving of the food. If you eat more than one serving, you must multiply the listed amount of sodium by the number of servings.  Choose foods with less than 140 mg of sodium per serving.  Avoid foods with 300 mg of sodium or more per serving. Shopping  Look for lower-sodium products, often labeled as "low-sodium" or "no salt added."  Always check the sodium content even if foods are labeled as "unsalted" or "no salt added".  Buy fresh foods. ? Avoid canned foods and premade or frozen meals. ? Avoid canned, cured, or processed meats  Buy breads that have less than 80 mg of sodium per slice. Cooking  Eat more home-cooked food and less restaurant, buffet, and fast food.  Avoid adding salt when cooking. Use salt-free seasonings or herbs  instead of table salt or sea salt. Check with your health care provider or pharmacist before using salt substitutes.  Cook with plant-based oils, such as canola, sunflower, or olive oil. Meal planning  When eating at a restaurant, ask that your food be prepared with less salt or no salt, if possible.  Avoid foods that contain MSG (monosodium glutamate). MSG is sometimes added to [place] food, bouillon, and some canned foods. What  foods are recommended? The items listed may not be a complete list. Talk with your dietitian about what dietary choices are best for you. Grains Low-sodium cereals, including oats, puffed wheat and rice, and shredded wheat. Low-sodium crackers. Unsalted rice. Unsalted pasta. Low-sodium bread. Whole-grain breads and whole-grain pasta. Vegetables Fresh or frozen vegetables. "No salt added" canned vegetables. "No salt added" tomato sauce and paste. Low-sodium or reduced-sodium tomato and vegetable juice. Fruits Fresh, frozen, or canned fruit. Fruit juice. Meats and other protein foods Fresh or frozen (no salt added) meat, poultry, seafood, and fish. Low-sodium canned tuna and salmon. Unsalted nuts. Dried peas, beans, and lentils without added salt. Unsalted canned beans. Eggs. Unsalted nut butters. Dairy Milk. Soy milk. Cheese that is naturally low in sodium, such as ricotta cheese, fresh mozzarella, or Swiss cheese Low-sodium or reduced-sodium cheese. Cream cheese. Yogurt. Fats and oils Unsalted butter. Unsalted margarine with no trans fat. Vegetable oils such as canola or olive oils. Seasonings and other foods Fresh and dried herbs and spices. Salt-free seasonings. Low-sodium mustard and ketchup. Sodium-free salad dressing. Sodium-free light mayonnaise. Fresh or refrigerated horseradish. Lemon juice. Vinegar. Homemade, reduced-sodium, or low-sodium soups. Unsalted popcorn and pretzels. Low-salt or salt-free chips. What foods are not recommended? The items listed may not be a complete list. Talk with your dietitian about what dietary choices are best for you. Grains Instant hot cereals. Bread stuffing, pancake, and biscuit mixes. Croutons. Seasoned rice or pasta mixes. Noodle soup cups. Boxed or frozen macaroni and cheese. Regular salted crackers. Self-rising flour. Vegetables Sauerkraut, pickled vegetables, and relishes. Olives. [place] fries. Onion rings. Regular canned vegetables (not low-sodium  or reduced-sodium). Regular canned tomato sauce and paste (not low-sodium or reduced-sodium). Regular tomato and vegetable juice (not low-sodium or reduced-sodium). Frozen vegetables in sauces. Meats and other protein foods Meat or fish that is salted, canned, smoked, spiced, or pickled. Bacon, ham, sausage, hotdogs, corned beef, chipped beef, packaged lunch meats, salt pork, jerky, pickled herring, anchovies, regular canned tuna, sardines, salted nuts. Dairy Processed cheese and cheese spreads. Cheese curds. Blue cheese. Feta cheese. String cheese. Regular cottage cheese. Buttermilk. Canned milk. Fats and oils Salted butter. Regular margarine. Ghee. Bacon fat. Seasonings and other foods Onion salt, garlic salt, seasoned salt, table salt, and sea salt. Canned and packaged gravies. Worcestershire sauce. Tartar sauce. Barbecue sauce. Teriyaki sauce. Soy sauce, including reduced-sodium. Steak sauce. Fish sauce. Oyster sauce. Cocktail sauce. Horseradish that you find on the shelf. Regular ketchup and mustard. Meat flavorings and tenderizers. Bouillon cubes. Hot sauce and Tabasco sauce. Premade or packaged marinades. Premade or packaged taco seasonings. Relishes. Regular salad dressings. Salsa. Potato and tortilla chips. Corn chips and puffs. Salted popcorn and pretzels. Canned or dried soups. Pizza. Frozen entrees and pot pies. Summary  Eating less sodium can help lower your blood pressure, reduce swelling, and protect your heart, liver, and kidneys.  Most people on this plan should limit their sodium intake to 1,500-2,000 mg (milligrams) of sodium each day.  Canned, boxed, and frozen foods are high in sodium. Restaurant foods, fast foods, and  pizza are also very high in sodium. You also get sodium by adding salt to food.  Try to cook at home, eat more fresh fruits and vegetables, and eat less fast food, canned, processed, or prepared foods. This information is not intended to replace advice given to  you by your health care provider. Make sure you discuss any questions you have with your health care provider. Document Revised: 10/31/2017 Document Reviewed: 11/11/2016 Elsevier Patient Education  2020 [employer].

## 2020-04-04 NOTE — Progress Notes (Signed)
CARDIOLOGY OFFICE NOTE  Date:  04/04/2020    [name] Date of Birth: 1925/10/23 Medical Record [id number]  PCP:  Binnie Rail, MD  Cardiologist:  Former patient of Dr. Sherryl Barters.   Now   Chief Complaint  Patient presents with  . Coronary Artery Disease  . Congestive Heart Failure  . Hypertension    Notes from Truitt Merle.  Tamara Gregory is a 86 y.o. female who presents today for a 4 month check. Former patient of Dr. Sherryl Barters.   She has a past history of HLD, HTN and myasthenia gravis and is followed for that at [facility]. She has had previous back surgery by Dr. Gladstone Lighter. She has a known 4 cm fusiform ascending aortic aneurysm . When the patient was initially admitted for her back surgery she had some chest pain in the preoperative holding area and her back surgery had to be postponed while she underwent a cardiac workup. She had a Myoview stress test which showed no ischemia. Her back surgery was subsequently rescheduled and performed without incident. She made a good recovery from her back surgery. However she has continued to have back pain and for this reason she underwent a implantation of a spinal cord stimulator as an outpatient at [facility] outpatient facility on 01/27/2014. The patient remains on low-dose prednisone 5 mg daily for her myasthenia gravis.   In March of 2015 she was hospitalized overnight at [facility] in [facility] for chest pain. She had a dobutamine stress echo the following morning which showed no evidence of ischemia and she was released. She was admitted with bradycardia and her metoprolol was stopped and has not been restarted.  Last visit back in November and she was felt to be doing well.   Comes back today. Here with her husband who is also being seen. She is mostly limited by her back pain - going to [place] later today. Taking at least 2 sometimes 3 Excedrins on a  daily basis. Some belly pain. Husband notes stool has been dark. She has early satiety. No chest pain. Breathing is good but she is more sedentary and will have some DOE with much activity. [place] the [place] - [place] there [place]. Has PCP arranged for June.   Sept. 21, 2017:  Tamara Gregory is seen today for the first time.   Previous patient of [facility].  Has of HTN Doing well today .   Has a tens unit implanted in her hip   No CP or dyspnea   February 24, 2017:  Tamara Gregory is seen today with her husband Tamara Gregory her 89th birthday recently .  Hx of hyperlipidemia , HTN,  No cardiac issues.  Had some chest pain /  Indigestion. Resolved when she dranks some soda   Dec. 19, 2018:  Has had some orthostasis .  Has fallen severl times.  BP has been low Not eating as much  Is on losartan - HCT   May 14, 2019   Ms. Krolak is seen in the office to discuss her recent symptoms of shortness of breath.  She had an echocardiogram recently which revealed severe aortic stenosis.  She was also found to have a large  apical aneurysm.  Her left ventricular systolic function is at least moderately reduced.  EF 35-40%.   She was also found to have mild - moderate aortic dilitation ( 4.4 cm )   She had  a hip fracture 2 months ago . Lack of energy  No CP.  Has shortness of breath frequently .  waks with a walker since the hip fracture .  Prior to hip , she could walk but not far.   Has lots of back pain  BNP has been markedly elevated.   Does not avoid salt Was started on Furosemide which seems to be helping.   Is very weak.  Is still on HCTZ also  Sept. 1, 2020  Tamara Gregory is seen today for follow up of her CAD and CHF It is apparent that she has had a large ant. MI and has been found to have a large anterior apical aneurism on echo.  EF 25%. She has mod- severe AS.   She is very weak and deconditioned .   When I saw her in June,  My assessment was that she would not benefit from an  evaluation for TAVR given her severe limitations related to her markedly reduced EF .   Has myosthemmia gravis and now has diplopia. Has her left eyeglass blacked out  Is scheduled to see neuro .   Still trying to get over her hip fracture from 6 months ago . She had a large MI around the time of the hip fracture  Walks with a walker, very short distances.   Not able to get any exercise   Very limited in her activities.   Dec. 16, 2020  Still having problems with diplopia due to myasthemia gravis  Mild CP on occasion.   Resolved on its own.  Is taking Kdue 40 meq  A day   Apr 04, 2020: Tamara Gregory  is seen today for follow-up visit.  Seen with husband, Ray.  She was seen last week urgently by Dr. Florence Canner for worsening shortness of breath.  Her Lasix was doubled.  She may need home oxygen.  She is here today for checkup and to measure her oxygen levels with and without oxygen and before and after exercise.  She improved with doubling of your furosemide.  She still has trouble at night when she is trying to go to sleep. Significant PND and orthopnea    O2 sat = 89% at rest O2 sats = 86% with walking 30-40 feet  O2  Sat improved to 91% with 2 liters O2 by [place]  She eats lots of salt    Past Medical History:  Diagnosis Date  . Anemia   . Anxiety   . Arthritis   . Cancer (HCC)    hx of skin cancer , colon   . Cataract    removed both eyes   . Chronic back pain    okay with sitting, increased back pain with activity   . Depression   . GERD (gastroesophageal reflux disease)   . Glaucoma   . Headache(784.0)    hx of  . History of kidney stones   . History of migraines   . Hyperlipidemia   . Hypertension   . Macular degeneration   . Myasthenia gravis ([place])   . Occasional tremors   . Pneumonia   . Spinal stenosis   . Thoracic aneurysm    4.4cm; see CT done 01/28/12 and 01/22/13 in [facility].   [name] Uses hearing aid   . Vitamin D deficiency     Past Surgical History:  Procedure  Laterality Date  . ABDOMINAL HYSTERECTOMY    . CHOLECYSTECTOMY    . COLON SURGERY     Right colectomy  dr. gross 03-17-18  . HIP ARTHROPLASTY Left 03/11/2019   Procedure: ARTHROPLASTY BIPOLAR HIP (HEMIARTHROPLASTY);  Surgeon: Renette Butters, MD;  [facility];  Service: Orthopedics;  Laterality: Left;  . LUMBAR LAMINECTOMY/DECOMPRESSION MICRODISCECTOMY N/A 02/04/2013   Procedure: CENTRAL DECOMPRESSION/LUMBAR LAMINECTOMY L3-L4 AND L4-L5 2 LEVELS;  Surgeon: Tobi Bastos, MD;  Location: [facility];  Service: Orthopedics;  Laterality: N/A;  . SPINAL CORD STIMULATOR IMPLANT     in right hip but not currently using because it did not help  . TONSILLECTOMY    . UPPER GASTROINTESTINAL ENDOSCOPY    . US ECHOCARDIOGRAPHY  05/18/2007   EF 55-60%     Medications: Current Outpatient Medications  Medication Sig Dispense Refill  . acetaminophen (TYLENOL) 325 MG tablet Take 2 tablets (650 mg total) by mouth every 6 (six) hours as needed for headache.    [name] aspirin 81 MG chewable tablet Chew 0.5 tablets (40.5 mg total) by mouth daily. 30 tablet 0  . brimonidine (ALPHAGAN) 0.2 % ophthalmic solution Place 1 drop into both eyes 3 (three) times daily.     . Cholecalciferol (VITAMIN D) 125 MCG (5000 UT) CAPS Take 5,000 Units by mouth daily.     . fluticasone (FLONASE) 50 MCG/ACT nasal spray Place 1 spray into both nostrils at bedtime.    [name] latanoprost (XALATAN) 0.005 % ophthalmic solution Place 1 drop into the left eye at bedtime.  1  . levothyroxine (SYNTHROID) 25 MCG tablet Take 1 tablet by mouth daily before breakfast. 90 tablet 1  . losartan (COZAAR) 25 MG tablet Take 1 tablet (25 mg total) by mouth daily. 90 tablet 3  . mirtazapine (REMERON) 7.5 MG tablet Take 1 tablet (7.5 mg total) by mouth at bedtime. 90 tablet 1  . Multiple Vitamins-Minerals (PRESERVISION AREDS) CAPS Take 1 capsule by mouth 2 (two) times daily.     [name] MYRBETRIQ 25 MG TB24 tablet TAKE 1 TABLET BY MOUTH  DAILY NEED OFFICE VISIT FOR MORE  REFILLS 90 tablet 1  . Omega-3 Fatty Acids (FISH OIL) 435 MG CAPS Take 435 mg by mouth daily.    [name] omeprazole (PRILOSEC) 20 MG capsule Take 1 capsule (20 mg total) by mouth daily. 90 capsule 3  . predniSONE (DELTASONE) 5 MG tablet Take 10 mg by mouth daily with breakfast.    . rosuvastatin (CRESTOR) 10 MG tablet Take 1 tablet (10 mg total) by mouth daily. 90 tablet 3  . furosemide (LASIX) 40 MG tablet Take 1 tablet (40 mg total) by mouth daily. 90 tablet 3  . potassium chloride SA (KLOR-CON) 20 MEQ tablet Take 1 tablet (20 mEq total) by mouth once for 1 dose. 90 tablet 3   No current facility-administered medications for this visit.    Allergies: Allergies  Allergen Reactions  . Tramadol Other (See Comments)    delirium    Social History: The patient  reports that she has never smoked. She has never used smokeless tobacco. She reports that she does not drink alcohol or use drugs.   Family History: The patient's family history includes Breast cancer in her sister; Cancer in her brother, sister, and sister; Diabetes in her father and sister; Healthy in her son; Heart attack in her father; Liver cancer in her sister; Lung cancer in her brother.   Review of Systems: Please see the history of present illness.   Otherwise, the review of systems is positive for none.   All other systems are  Physical Exam: Blood pressure 126/62, pulse [name])  101, height 5\' 2"  (1.575 m), weight 154 lb 1.9 oz (69.9 kg), SpO2 (!) 80 %.  GEN:   Elderly female ,   Examined in wheelchair HEENT: Normal NECK: No JVD; No carotid bruits LYMPHATICS: No lymphadenopathy CARDIAC: RRR ,  Soft systolic murmur  RESPIRATORY:  Clear to auscultation without rales, wheezing or rhonchi  ABDOMEN: Soft, non-tender, non-distended MUSCULOSKELETAL:  No edema;  Chronic stasis changes.  SKIN: Warm and dry NEUROLOGIC:  Alert and oriented x 3     LABORATORY DATA:  EKG:    Lab Results  Component Value Date   WBC 7.0  02/08/2020   HGB 11.7 03/27/2020   HCT 35.4 03/27/2020   PLT 266.0 02/08/2020   GLUCOSE 117 (H) 03/27/2020   CHOL 166 02/08/2020   TRIG 108.0 02/08/2020   HDL 63.40 02/08/2020   LDLDIRECT 139.3 10/04/2013   LDLCALC 81 02/08/2020   ALT 17 03/27/2020   AST 19 03/27/2020   NA 145 (H) 03/27/2020   K 4.5 03/27/2020   CL 104 03/27/2020   CREATININE 0.82 03/27/2020   BUN 23 03/27/2020   CO2 27 03/27/2020   TSH 1.740 03/27/2020   INR 1.01 01/21/2013   HGBA1C 6.1 02/08/2020    BNP (last 3 results) No results for input(s): BNP in the last 8760 hours.  ProBNP (last 3 results) Recent Labs    04/30/19 1701 05/07/19 0955 03/27/20 1529  PROBNP 4,751* 4,628* 3,893*     Other Studies Reviewed Today:  Echo Study Conclusions from 2014  - Left ventricle: The cavity size was normal. Wall thickness was increased in a pattern of moderate LVH. There was mild focal basal hypertrophy of the septum. Systolic function was normal. The estimated ejection fraction was in the range of 55% to 60%. Wall motion was normal; there were no regional wall motion abnormalities. Doppler parameters are consistent with abnormal left ventricular relaxation (grade 1 diastolic dysfunction). - Aortic valve: Valve mobility was restricted. There was mild stenosis. Mild regurgitation. Valve area: 1.6cm^2(VTI). Valve area: 1.48cm^2 (Vmax).  CT Angio CHEST IMPRESSION FROM 01/2013: Unchanged thoracic aneurysm with maximal measurement 44 mm. Severe atherosclerosis with ulcerated plaque along the arch but no acute abnormalities.    Assessment/Plan: 1. Acute on chronic systolic CHF:      O2 sat = 89% at rest O2 sats = 86% with walking 30-40 feet  O2  Sat improved to 91% with 2 liters O2 by [place]  Will order supplemental O2.  Her heart failure seems to be quite a bit better having taken double dose Lasix for the past week or so.  She clearly eats too much salt.  We had a long discussion  about salt restriction.  I am hoping that she will restrict her salt and we can reduce her Lasix back down to the previous dose.  Her husband informed me that she has been having a dry mouth and as a result has developed several cavities over the past several months.  This is probably related to her Lasix therapy.  I will see her back in approximately several months to make sure she is doing okay.  Her overall condition continues to gradually and slowly decline.   2.  CAD :  Likely had a MI with her hip fracture , no angina   3.  AS _- mod - severe.  .stable   2. Hypercholesterolemia -    Cont meds.   3. Myasthenia gravis followed at [facility]: .  4. Past history of spinal  stenosis with chronic low back pain. Implantation of spinal cord stimulator at [facility] annex on 01/27/14   5.  Frequent falls and poor balance:  Uses wheelchair   6. History of fusiform aneurysm of ascending aorta 4 cm -   Current medicines are reviewed with the patient today.  The patient does not have concerns regarding medicines other than what has been noted above.  The following changes have been made:  See above.  Labs/ tests ordered today include:    Orders Placed This Encounter  Procedures  . For home use only DME oxygen  . Basic Metabolic Panel (BMET)     Mertie Moores, MD  04/04/2020 [facility] [place],  [place]  [address] Pager [phone] Phone: [phone]; Fax: [phone]

## 2020-04-05 LAB — BASIC METABOLIC PANEL
BUN/Creatinine Ratio: 24 (ref 12–28)
BUN: 24 mg/dL (ref 10–36)
CO2: 26 mmol/L (ref 20–29)
Calcium: 9.5 mg/dL (ref 8.7–10.3)
Chloride: 101 mmol/L (ref 96–106)
Creatinine, Ser: 0.98 mg/dL (ref 0.57–1.00)
GFR calc Af Amer: 58 mL/min/{1.73_m2} — ABNORMAL LOW (ref >59)
GFR calc non Af Amer: 50 mL/min/{1.73_m2} — ABNORMAL LOW (ref >59)
Glucose: 155 mg/dL — ABNORMAL HIGH (ref 65–99)
Potassium: 4.6 mmol/L (ref 3.5–5.2)
Sodium: 142 mmol/L (ref 134–144)

## 2020-04-06 ENCOUNTER — Telehealth: Payer: Self-pay | Admitting: Cardiovascular Disease

## 2020-04-06 NOTE — Addendum Note (Signed)
Addended by: Emmaline Life on: 04/06/2020 09:20 AM   Modules accepted: Orders

## 2020-04-06 NOTE — Telephone Encounter (Signed)
Patient's last office visit was faxed to [phone] which contains the information requested by [place] including symptoms associated w/CHF diagnosis and any treatments used, and resting O2 sat, O2 sat on exertion, and O2 sat on exertion with 2 L O2 via [place].  Confirmation received

## 2020-04-06 NOTE — Telephone Encounter (Signed)
New message   Per [place] states that a fax was sent to [phone]. Please review the iInformation on the fax and give Tamara Gregory a call if you have any questions.

## 2020-04-07 ENCOUNTER — Telehealth: Payer: Self-pay | Admitting: Cardiovascular Disease

## 2020-04-07 DIAGNOSIS — I5032 Chronic diastolic (congestive) heart failure: Secondary | ICD-10-CM | POA: Diagnosis not present

## 2020-04-07 DIAGNOSIS — R0602 Shortness of breath: Secondary | ICD-10-CM | POA: Diagnosis not present

## 2020-04-07 NOTE — Telephone Encounter (Signed)
Spoke with Dusty at [facility] and advised that the requested information is listed on page 8 of 17 of the faxed documents that we sent yesterday. She verbalized understanding and agreement and says that they will continue with processing the order. She said it must have been overlooked by her colleague, Vincente Liberty, and thanked me for the call.

## 2020-04-07 NOTE — Telephone Encounter (Signed)
See phone note from 04/06/20. Adrienne from [employer] states that she faxed over the same form from yesterday. She states that the only thing that needs to be updated are the saturations. She gave two fax numbers to fax to [phone] and [phone]

## 2020-04-14 ENCOUNTER — Ambulatory Visit: Payer: Medicare Other | Admitting: Cardiovascular Disease

## 2020-04-14 DIAGNOSIS — H524 Presbyopia: Secondary | ICD-10-CM | POA: Diagnosis not present

## 2020-04-14 DIAGNOSIS — H52203 Unspecified astigmatism, bilateral: Secondary | ICD-10-CM | POA: Diagnosis not present

## 2020-04-26 DIAGNOSIS — N390 Urinary tract infection, site not specified: Secondary | ICD-10-CM | POA: Diagnosis not present

## 2020-04-26 DIAGNOSIS — R82998 Other abnormal findings in urine: Secondary | ICD-10-CM | POA: Diagnosis not present

## 2020-05-02 DIAGNOSIS — H532 Diplopia: Secondary | ICD-10-CM | POA: Diagnosis not present

## 2020-05-02 DIAGNOSIS — Z961 Presence of intraocular lens: Secondary | ICD-10-CM | POA: Diagnosis not present

## 2020-05-02 DIAGNOSIS — Z9841 Cataract extraction status, right eye: Secondary | ICD-10-CM | POA: Diagnosis not present

## 2020-05-02 DIAGNOSIS — H02401 Unspecified ptosis of right eyelid: Secondary | ICD-10-CM | POA: Diagnosis not present

## 2020-05-02 DIAGNOSIS — G7000 Myasthenia gravis without (acute) exacerbation: Secondary | ICD-10-CM | POA: Diagnosis not present

## 2020-05-02 DIAGNOSIS — H5010 Unspecified exotropia: Secondary | ICD-10-CM | POA: Diagnosis not present

## 2020-05-02 DIAGNOSIS — H50112 Monocular exotropia, left eye: Secondary | ICD-10-CM | POA: Diagnosis not present

## 2020-05-02 DIAGNOSIS — H5022 Vertical strabismus, left eye: Secondary | ICD-10-CM | POA: Diagnosis not present

## 2020-05-02 DIAGNOSIS — H509 Unspecified strabismus: Secondary | ICD-10-CM | POA: Diagnosis not present

## 2020-05-02 DIAGNOSIS — Z9842 Cataract extraction status, left eye: Secondary | ICD-10-CM | POA: Diagnosis not present

## 2020-05-03 ENCOUNTER — Other Ambulatory Visit: Payer: Self-pay | Admitting: Cardiovascular Disease

## 2020-05-03 DIAGNOSIS — N3281 Overactive bladder: Secondary | ICD-10-CM | POA: Diagnosis not present

## 2020-05-08 DIAGNOSIS — I5032 Chronic diastolic (congestive) heart failure: Secondary | ICD-10-CM | POA: Diagnosis not present

## 2020-05-08 DIAGNOSIS — R0602 Shortness of breath: Secondary | ICD-10-CM | POA: Diagnosis not present

## 2020-05-10 ENCOUNTER — Ambulatory Visit: Payer: Medicare Other | Admitting: Cardiovascular Disease

## 2020-05-10 ENCOUNTER — Encounter: Payer: Self-pay | Admitting: Cardiovascular Disease

## 2020-05-10 ENCOUNTER — Other Ambulatory Visit: Payer: Self-pay

## 2020-05-10 VITALS — BP 96/62 | HR 74 | Ht 62.0 in | Wt 152.2 lb

## 2020-05-10 DIAGNOSIS — I5043 Acute on chronic combined systolic (congestive) and diastolic (congestive) heart failure: Secondary | ICD-10-CM

## 2020-05-10 DIAGNOSIS — I2510 Atherosclerotic heart disease of native coronary artery without angina pectoris: Secondary | ICD-10-CM

## 2020-05-10 DIAGNOSIS — I5023 Acute on chronic systolic (congestive) heart failure: Secondary | ICD-10-CM | POA: Diagnosis not present

## 2020-05-10 MED ORDER — POTASSIUM CHLORIDE CRYS ER 20 MEQ PO TBCR: EXTENDED_RELEASE_TABLET | ORAL | 3 refills | Status: DC

## 2020-05-10 MED ORDER — FUROSEMIDE 40 MG PO TABS: ORAL_TABLET | ORAL | 3 refills | Status: DC

## 2020-05-10 NOTE — Patient Instructions (Addendum)
Medication Instructions:  1) DECREASE Furosemide and Potassium to just twice a week.  Take on the same two days each week.   *If you need a refill on your cardiac medications before your next appointment, please call your pharmacy*   Lab Work: You will need to have labs drawn at the time of your next appointment.  Please come fasting (nothing to eat or drink after midnight except water and black coffee) If you have labs (blood work) drawn today and your tests are completely normal, you will receive your results only by:  [place] (if you have MyChart) OR  A paper copy in the mail If you have any lab test that is abnormal or we need to change your treatment, we will call you to review the results.   Testing/Procedures: None   Follow-Up: At [facility], you and your health needs are our priority.  As part of our continuing mission to provide you with exceptional heart care, we have created designated Provider Care Teams.  These Care Teams include your primary Cardiologist (physician) and Advanced Practice Providers (APPs -  Physician Assistants and Nurse Practitioners) who all work together to provide you with the care you need, when you need it.  We recommend signing up for the patient portal called "MyChart".  Sign up information is provided on this After Visit Summary.  MyChart is used to connect with patients for Virtual Visits (Telemedicine).  Patients are able to view lab/test results, encounter notes, upcoming appointments, etc.  Non-urgent messages can be sent to your provider as well.   To learn more about what you can do with MyChart, go to [email or web address].    Your next appointment:   3 month(s)  The format for your next appointment:   In Person  Provider:   You may see Mertie Moores, MD or one of the following Advanced Practice Providers on your designated Care Team:    Richardson Dopp, PA-C  [place], [place]    Other Instructions    Buy a pulse  oxymeter at [facility] or similar Oxygen saturations should be greater than 92 %.

## 2020-05-10 NOTE — Progress Notes (Signed)
CARDIOLOGY OFFICE NOTE  Date:  05/10/2020    Tamara Gregory Date of Birth: 07/05/1925 Medical Record #[id number]  PCP:  Binnie Rail, MD  Cardiologist:  Former patient of Dr. Sherryl Barters.   Now Tamara Gregory  Chief Complaint  Patient presents with  . Coronary Artery Disease  . Congestive Heart Failure    Notes from Truitt Merle.  Tamara Gregory is a 86 y.o. female who presents today for a 4 month check. Former patient of Dr. Sherryl Barters.   She has a past history of HLD, HTN and myasthenia gravis and is followed for that at [facility]. She has had previous back surgery by Dr. Gladstone Lighter. She has a known 4 cm fusiform ascending aortic aneurysm . When the patient was initially admitted for her back surgery she had some chest pain in the preoperative holding area and her back surgery had to be postponed while she underwent a cardiac workup. She had a Myoview stress test which showed no ischemia. Her back surgery was subsequently rescheduled and performed without incident. She made a good recovery from her back surgery. However she has continued to have back pain and for this reason she underwent a implantation of a spinal cord stimulator as an outpatient at [facility] outpatient facility on 01/27/2014. The patient remains on low-dose prednisone 5 mg daily for her myasthenia gravis.   In March of 2015 she was hospitalized overnight at [facility] in [facility] for chest pain. She had a dobutamine stress echo the following morning which showed no evidence of ischemia and she was released. She was admitted with bradycardia and her metoprolol was stopped and has not been restarted.  Last visit back in November and she was felt to be doing well.   Comes back today. Here with her husband who is also being seen. She is mostly limited by her back pain - going to [place] later today. Taking at least 2 sometimes 3 Excedrins on a daily basis. Some  belly pain. Husband notes stool has been dark. She has early satiety. No chest pain. Breathing is good but she is more sedentary and will have some DOE with much activity. [place]. Has PCP arranged for June.   Sept. 21, 2017:  Tamara Gregory is seen today for the first time.   Previous patient of [facility].  Has of HTN Doing well today .   Has a tens unit implanted in her hip   No CP or dyspnea   February 24, 2017:  Tamara Gregory is seen today with her husband Tamara Gregory her 89th birthday recently .  Hx of hyperlipidemia , HTN,  No cardiac issues.  Had some chest pain /  Indigestion. Resolved when she dranks some soda   Dec. 19, 2018:  Has had some orthostasis .  Has fallen severl times.  BP has been low Not eating as much  Is on losartan - HCT   May 14, 2019   Tamara Gregory is seen in the office to discuss her recent symptoms of shortness of breath.  She had an echocardiogram recently which revealed severe aortic stenosis.  She was also found to have a large  apical aneurysm.  Her left ventricular systolic function is at least moderately reduced.  EF 35-40%.   She was also found to have mild - moderate aortic dilitation ( 4.4 cm )   She had a hip fracture  2 months ago . Lack of energy  No CP.  Has shortness of breath frequently .  waks with a walker since the hip fracture .  Prior to hip , she could walk but not far.   Has lots of back pain  BNP has been markedly elevated.   Does not avoid salt Was started on Furosemide which seems to be helping.   Is very weak.  Is still on HCTZ also  Sept. 1, 2020  Tamara Gregory is seen today for follow up of her CAD and CHF It is apparent that she has had a large ant. MI and has been found to have a large anterior apical aneurism on echo.  EF 25%. She has mod- severe AS.   She is very weak and deconditioned .   When I saw her in June,  My assessment was that she would not benefit from an evaluation for TAVR  given her severe limitations related to her markedly reduced EF .   Has myosthemmia gravis and now has diplopia. Has her left eyeglass blacked out  Is scheduled to see neuro .   Still trying to get over her hip fracture from 6 months ago . She had a large MI around the time of the hip fracture  Walks with a walker, very short distances.   Not able to get any exercise   Very limited in her activities.   Dec. 16, 2020  Still having problems with diplopia due to myasthemia gravis  Mild CP on occasion.   Resolved on its own.  Is taking Kdue 40 meq  A day   Apr 04, 2020: Tamara Gregory  is seen today for follow-up visit.  Seen with husband, Ray.  She was seen last week urgently by Dr. Florence Canner for worsening shortness of breath.  Her Lasix was doubled.  She may need home oxygen.  She is here today for checkup and to measure her oxygen levels with and without oxygen and before and after exercise.  She improved with doubling of your furosemide.  She still has trouble at night when she is trying to go to sleep. Significant PND and orthopnea    O2 sat = 89% at rest O2 sats = 86% with walking 30-40 feet  O2  Sat improved to 91% with 2 liters O2 by [place]  She eats lots of salt   May 10, 2020:  Tamara Gregory is seen back today for a follow-up visit.  She has a history of congestive heart failure presumably due to a perioperative myocardial infarction that occurred last year.  She is required home oxygen therapy.  She has moderate to severe aortic stenosis.  Does not have much of an appetite.   Doesn't eat much  .  Is not losing weight .  Remains thirsty Will reduce lasix and kdur to twice a week     Past Medical History:  Diagnosis Date  . Anemia   . Anxiety   . Arthritis   . Cancer (HCC)    hx of skin cancer , colon   . Cataract    removed both eyes   . Chronic back pain    okay with sitting, increased back pain with activity   . Depression   . GERD (gastroesophageal reflux disease)   .  Glaucoma   . Headache(784.0)    hx of  . History of kidney stones   . History of migraines   . Hyperlipidemia   . Hypertension   . Macular  degeneration   . Myasthenia gravis ([place])   . Occasional tremors   . Pneumonia   . Spinal stenosis   . Thoracic aneurysm    4.4cm; see CT done 01/28/12 and 01/22/13 in [facility].   [name] Uses hearing aid   . Vitamin D deficiency     Past Surgical History:  Procedure Laterality Date  . ABDOMINAL HYSTERECTOMY    . CHOLECYSTECTOMY    . COLON SURGERY     Right colectomy dr. gross 03-17-18  . HIP ARTHROPLASTY Left 03/11/2019   Procedure: ARTHROPLASTY BIPOLAR HIP (HEMIARTHROPLASTY);  Surgeon: Renette Butters, MD;  Location: [facility];  Service: Orthopedics;  Laterality: Left;  . LUMBAR LAMINECTOMY/DECOMPRESSION MICRODISCECTOMY N/A 02/04/2013   Procedure: CENTRAL DECOMPRESSION/LUMBAR LAMINECTOMY L3-L4 AND L4-L5 2 LEVELS;  Surgeon: Tobi Bastos, MD;  Location: [facility];  Service: Orthopedics;  Laterality: N/A;  . SPINAL CORD STIMULATOR IMPLANT     in right hip but not currently using because it did not help  . TONSILLECTOMY    . UPPER GASTROINTESTINAL ENDOSCOPY    . US ECHOCARDIOGRAPHY  05/18/2007   EF 55-60%     Medications: Current Outpatient Medications  Medication Sig Dispense Refill  . acetaminophen (TYLENOL) 325 MG tablet Take 2 tablets (650 mg total) by mouth every 6 (six) hours as needed for headache.    [name] aspirin 81 MG chewable tablet Chew 0.5 tablets (40.5 mg total) by mouth daily. 30 tablet 0  . brimonidine (ALPHAGAN) 0.2 % ophthalmic solution Place 1 drop into both eyes 3 (three) times daily.     . Cholecalciferol (VITAMIN D) 125 MCG (5000 UT) CAPS Take 5,000 Units by mouth daily.     . fluticasone (FLONASE) 50 MCG/ACT nasal spray Place 1 spray into both nostrils at bedtime.    . furosemide (LASIX) 40 MG tablet Take 1 tablet (40 mg total) by mouth daily. 90 tablet 3  . latanoprost (XALATAN) 0.005 % ophthalmic solution Place 1 drop into the left eye  at bedtime.  1  . levothyroxine (SYNTHROID) 25 MCG tablet Take 1 tablet by mouth daily before breakfast. 90 tablet 1  . losartan (COZAAR) 25 MG tablet TAKE 1 TABLET BY MOUTH  DAILY 90 tablet 3  . Multiple Vitamins-Minerals (PRESERVISION AREDS) CAPS Take 1 capsule by mouth 2 (two) times daily.     [name] MYRBETRIQ 25 MG TB24 tablet TAKE 1 TABLET BY MOUTH  DAILY NEED OFFICE VISIT FOR MORE REFILLS 90 tablet 1  . Omega-3 Fatty Acids (FISH OIL) 435 MG CAPS Take 435 mg by mouth daily.    [name] omeprazole (PRILOSEC) 20 MG capsule Take 20 mg by mouth as needed.    . potassium chloride SA (KLOR-CON) 20 MEQ tablet Take 1 tablet (20 mEq total) by mouth once for 1 dose. 90 tablet 3  . predniSONE (DELTASONE) 5 MG tablet Take 10 mg by mouth daily with breakfast.    . rosuvastatin (CRESTOR) 10 MG tablet Take 1 tablet (10 mg total) by mouth daily. 90 tablet 3  . sertraline (ZOLOFT) 100 MG tablet Take 1 tablet by mouth daily.     No current facility-administered medications for this visit.    Allergies: Allergies  Allergen Reactions  . Tramadol Other (See Comments)    delirium    Social History: The patient  reports that she has never smoked. She has never used smokeless tobacco. She reports that she does not drink alcohol or use drugs.   Family History: The patient's family history includes Breast  cancer in her sister; Cancer in her brother, sister, and sister; Diabetes in her father and sister; Healthy in her son; Heart attack in her father; Liver cancer in her sister; Lung cancer in her brother.   Review of Systems: Please see the history of present illness.   Otherwise, the review of systems is positive for none.   All other systems are  Physical Exam: Blood pressure 96/62, pulse 74, height 5\' 2"  (1.575 m), weight 152 lb 4 oz (69.1 kg), SpO2 93 %.  GEN:   Elderly , chronically ill appearing female.  HEENT: Normal NECK: No JVD; No carotid bruits LYMPHATICS: No lymphadenopathy CARDIAC: RRR  2/6  systolic murmur  RESPIRATORY:  Clear to auscultation without rales, wheezing or rhonchi  ABDOMEN: Soft, non-tender, non-distended MUSCULOSKELETAL:  No edema; No deformity ,  Reduced skin turgur in hands  SKIN: Warm and dry NEUROLOGIC:  Alert and oriented x 3   LABORATORY DATA:  EKG:    Lab Results  Component Value Date   WBC 7.0 02/08/2020   HGB 11.7 03/27/2020   HCT 35.4 03/27/2020   PLT 266.0 02/08/2020   GLUCOSE 155 (H) 04/04/2020   CHOL 166 02/08/2020   TRIG 108.0 02/08/2020   HDL 63.40 02/08/2020   LDLDIRECT 139.3 10/04/2013   LDLCALC 81 02/08/2020   ALT 17 03/27/2020   AST 19 03/27/2020   NA 142 04/04/2020   K 4.6 04/04/2020   CL 101 04/04/2020   CREATININE 0.98 04/04/2020   BUN 24 04/04/2020   CO2 26 04/04/2020   TSH 1.740 03/27/2020   INR 1.01 01/21/2013   HGBA1C 6.1 02/08/2020    BNP (last 3 results) No results for input(s): BNP in the last 8760 hours.  ProBNP (last 3 results) Recent Labs    03/27/20 1529  PROBNP 0,174*     Other Studies Reviewed Today:  Echo Study Conclusions from 2014  - Left ventricle: The cavity size was normal. Wall thickness was increased in a pattern of moderate LVH. There was mild focal basal hypertrophy of the septum. Systolic function was normal. The estimated ejection fraction was in the range of 55% to 60%. Wall motion was normal; there were no regional wall motion abnormalities. Doppler parameters are consistent with abnormal left ventricular relaxation (grade 1 diastolic dysfunction). - Aortic valve: Valve mobility was restricted. There was mild stenosis. Mild regurgitation. Valve area: 1.6cm^2(VTI). Valve area: 1.48cm^2 (Vmax).  CT Angio CHEST IMPRESSION FROM 01/2013: Unchanged thoracic aneurysm with maximal measurement 44 mm. Severe atherosclerosis with ulcerated plaque along the arch but no acute abnormalities.    Assessment/Plan: 1. Acute on chronic systolic CHF:      O2 sat =  89% at rest O2 sats = 86% with walking 30-40 feet  O2  Sat improved to 91% with 2 liters O2 by [place]  She seems to be fairly well controlled.  In fact I think she is a little bit over diuresed as a reflection of her reduced skin turgor and her hypertension. We will reduce the Lasix and potassium to twice a week.  I will see her again in 3 months for follow-up visit we will assess her again at that time.    2.  CAD :  Likely had a MI with her hip fracture , she denies having any episodes of angina.  3.  AS _-her aortic stenosis appears to be moderate to severe.  She seems to be fairly stable.  She is quite frail.  I do not have any  plans to refer her for TAVR at this time.  2. Hypercholesterolemia -    continue current medications.  3. Myasthenia gravis followed at [facility]: .  4. Past history of spinal stenosis with chronic low back pain. Implantation of spinal cord stimulator at [facility] on 01/27/14   5.  Frequent falls and poor balance:  Uses wheelchair   6. History of fusiform aneurysm of ascending aorta 4 cm -   Current medicines are reviewed with the patient today.  The patient does not have concerns regarding medicines other than what has been noted above.  The following changes have been made:  See above.  Labs/ tests ordered today include:    No orders of the defined types were placed in this encounter.    Mertie Moores, MD  05/10/2020 11:57 AM    [address],  [place], [place]  [address] Pager [phone] Phone: [phone]; Fax: [phone]

## 2020-05-16 ENCOUNTER — Telehealth: Payer: Self-pay | Admitting: Cardiovascular Disease

## 2020-05-16 NOTE — Telephone Encounter (Signed)
Patient's husband (DPR) is calling about patient not feeling well and is a little puffy (puffiness is better than what it was, per husband). Patient's BP 93/73, HR 95 and  O2 98% with oxygen 2 L.. Patient's Lasix and potassium was recently changed to twice weekly, prior every other day. Consulted, DOD, Dr. Curt Bears. He advised to hold losartan for now and follow up with Dr. Acie Fredrickson. Will send message to Dr. Acie Fredrickson and his nurse to advise further.   Patient's husband will hold losartan for now until he hears back from Dr. Acie Fredrickson.

## 2020-05-16 NOTE — Telephone Encounter (Signed)
  Husband is calling because he would like to speak to Dr Elmarie Shiley nurse regarding the patient. He states her BP is 93/73, oxygen level is 85 off oxygen and 98 on oxygen, he states her potassium and furosemide were changed recently and her face is looking puffy. He would like to discuss further with nurse.

## 2020-05-17 ENCOUNTER — Telehealth: Payer: Self-pay | Admitting: Adult Health

## 2020-05-17 NOTE — Telephone Encounter (Signed)
Pt husband called Ray ask Rollene Fare to return his call ASAP @ [phone]

## 2020-05-17 NOTE — Telephone Encounter (Signed)
Called and spoke with patient husband. Remeron stopped due to dry mouth. Restarted Zoloft 100mg . Not sleeping at night. Will review medications and make suggestions.

## 2020-05-18 NOTE — Telephone Encounter (Signed)
Called patient's husband who reports BP reading today is 120/86 and yesterday was 117/86 since stopping losartan. He gave her lasix 40 mg today and kdur 20 mEq. I advised that per Dr. Acie Fredrickson, patient may have an extra dose during the week if he notices that she looks swollen as long as BP is stable. Husband verbalized understanding and agreement with plan and thanked me for the call.

## 2020-05-18 NOTE — Telephone Encounter (Signed)
RTC to [place], husband he said she slept better last night increasing Melatonin to 10 mg. She also is now having to sleep with her oxygen on and it's affecting her sleep as well. Discussed with Rollene Fare and she wants to wait to prescribe anything else stronger for sleep now. Continue using the melatonin 10 mg or Tylenol PM. He agreed to call back if symptoms not improving.

## 2020-05-18 NOTE — Telephone Encounter (Signed)
Agree with holding losartan  Lets have her take Lasix 40 mg and potassium 20 meq today

## 2020-05-18 NOTE — Telephone Encounter (Signed)
Noted thank you

## 2020-05-19 ENCOUNTER — Other Ambulatory Visit: Payer: Self-pay

## 2020-05-19 ENCOUNTER — Encounter: Payer: Self-pay | Admitting: Internal Medicine

## 2020-05-19 ENCOUNTER — Ambulatory Visit (INDEPENDENT_AMBULATORY_CARE_PROVIDER_SITE_OTHER): Payer: Medicare Other | Admitting: Internal Medicine

## 2020-05-19 VITALS — BP 122/76 | HR 78 | Temp 98.8°F | Ht 62.0 in

## 2020-05-19 DIAGNOSIS — I5022 Chronic systolic (congestive) heart failure: Secondary | ICD-10-CM

## 2020-05-19 DIAGNOSIS — G479 Sleep disorder, unspecified: Secondary | ICD-10-CM

## 2020-05-19 DIAGNOSIS — F3289 Other specified depressive episodes: Secondary | ICD-10-CM | POA: Diagnosis not present

## 2020-05-19 DIAGNOSIS — I10 Essential (primary) hypertension: Secondary | ICD-10-CM

## 2020-05-19 LAB — CBC
HCT: 31.2 % — ABNORMAL LOW (ref 36.0–46.0)
Hemoglobin: 10.3 g/dL — ABNORMAL LOW (ref 12.0–15.0)
MCHC: 33.1 g/dL (ref 30.0–36.0)
MCV: 101.5 fl — ABNORMAL HIGH (ref 78.0–100.0)
Platelets: 173 10*3/uL (ref 150.0–400.0)
RBC: 3.07 Mil/uL — ABNORMAL LOW (ref 3.87–5.11)
RDW: 16.3 % — ABNORMAL HIGH (ref 11.5–15.5)
WBC: 6.3 10*3/uL (ref 4.0–10.5)

## 2020-05-19 LAB — COMPREHENSIVE METABOLIC PANEL
ALT: 34 U/L (ref 0–35)
AST: 23 U/L (ref 0–37)
Albumin: 4.3 g/dL (ref 3.5–5.2)
Alkaline Phosphatase: 50 U/L (ref 39–117)
BUN: 24 mg/dL — ABNORMAL HIGH (ref 6–23)
CO2: 33 mEq/L — ABNORMAL HIGH (ref 19–32)
Calcium: 9.8 mg/dL (ref 8.4–10.5)
Chloride: 103 mEq/L (ref 96–112)
Creatinine, Ser: 0.84 mg/dL (ref 0.40–1.20)
GFR: 63.23 mL/min (ref >60.00)
Glucose, Bld: 160 mg/dL — ABNORMAL HIGH (ref 70–99)
Potassium: 4 mEq/L (ref 3.5–5.1)
Sodium: 142 mEq/L (ref 135–145)
Total Bilirubin: 0.7 mg/dL (ref 0.2–1.2)
Total Protein: 6.6 g/dL (ref 6.0–8.3)

## 2020-05-19 LAB — VITAMIN D 25 HYDROXY (VIT D DEFICIENCY, FRACTURES): VITD: 93.65 ng/mL (ref 30.00–100.00)

## 2020-05-19 LAB — BRAIN NATRIURETIC PEPTIDE: Pro B Natriuretic peptide (BNP): 1426 pg/mL — ABNORMAL HIGH (ref 0.0–100.0)

## 2020-05-19 LAB — TSH: TSH: 2.79 u[IU]/mL (ref 0.35–4.50)

## 2020-05-19 LAB — VITAMIN B12: Vitamin B-12: 509 pg/mL (ref 211–911)

## 2020-05-19 MED ORDER — TRAZODONE HCL 50 MG PO TABS: 50.0000 mg | ORAL_TABLET | Freq: Every evening | ORAL | 3 refills | Status: DC | PRN

## 2020-05-19 NOTE — Assessment & Plan Note (Signed)
Previously on trazodone back in 2019 per our records. When asked her family member recalls that this was stopped due to not working as well anymore. Rx trazodone 50 mg nightly and counseled about this.

## 2020-05-19 NOTE — Assessment & Plan Note (Signed)
Checking CMP, BNP to rule out flare. She appears euvolemic on exam today.

## 2020-05-19 NOTE — Progress Notes (Signed)
   Subjective:   Patient ID: Tamara Gregory, female    DOB: 07-Feb-1925, 86 y.o.   MRN: [id number]  HPI The patient is a 86 YO female coming in for sleep problems. Talked to her psychiatrist recently and they recommended melatonin and did not want to prescribe anything stronger. She previously was taking remeron but stopped that due to dry mouth recently. This had been helping with sleep more. She did not fall asleep until 7:30 AM this morning and slept until 10 this morning. Some napping today. She is not sleeping well for weeks and is frustrated that nothing can be done for her by psych. She is feeling very poorly since not sleeping well. She has almost no appetite although weight is stable. She recently saw cardiology and they put her on oxygen which did help at night the first night or two and now she cannot tell a difference. Family member present and helps to provide most history.   PMH, [facility], social history reviewed and updated  Review of Systems  Constitutional: Positive for activity change and appetite change.  HENT: Negative.   Eyes: Negative.   Respiratory: Negative for cough, chest tightness and shortness of breath.   Cardiovascular: Negative for chest pain, palpitations and leg swelling.  Gastrointestinal: Negative for abdominal distention, abdominal pain, constipation, diarrhea, nausea and vomiting.  Musculoskeletal: Negative.   Skin: Negative.   Neurological: Positive for weakness.  Psychiatric/Behavioral: Positive for decreased concentration, dysphoric mood and sleep disturbance.    Objective:  Physical Exam Constitutional:      Appearance: She is well-developed.     Comments: frail  HENT:     Head: Normocephalic and atraumatic.  Cardiovascular:     Rate and Rhythm: Normal rate and regular rhythm.  Pulmonary:     Effort: Pulmonary effort is normal. No respiratory distress.     Breath sounds: Normal breath sounds. No wheezing or rales.  Abdominal:     General: Bowel  sounds are normal. There is no distension.     Palpations: Abdomen is soft.     Tenderness: There is no abdominal tenderness. There is no rebound.  Musculoskeletal:     Cervical back: Normal range of motion.  Skin:    General: Skin is warm and dry.  Neurological:     Mental Status: She is alert and oriented to person, place, and time.     Coordination: Coordination abnormal.     Comments: In wheelchair  Psychiatric:     Comments: Slightly flat affect versus tired     Vitals:   05/19/20 1455  BP: 122/76  Pulse: 78  Temp: 98.8 F (37.1 C)  TempSrc: Oral  SpO2: 96%  Height: 5\' 2"  (1.575 m)   This visit occurred during the SARS-CoV-2 public health emergency.  Safety protocols were in place, including screening questions prior to the visit, additional usage of staff PPE, and extensive cleaning of exam room while observing appropriate contact time as indicated for disinfecting solutions.   Assessment & Plan:

## 2020-05-19 NOTE — Patient Instructions (Signed)
We have sent in the trazodone to take 1 pill at night time. If this does not work you can try 2 pills at night time starting tomorrow.

## 2020-05-19 NOTE — Assessment & Plan Note (Signed)
She is currently taking zoloft and this is not working all that well and will continue to follow up with psych about this. Per brief records review she has been on multiple different agents in the last 5 years.

## 2020-05-24 ENCOUNTER — Telehealth: Payer: Self-pay | Admitting: Cardiovascular Disease

## 2020-05-24 NOTE — Telephone Encounter (Signed)
Please have him increase the lasix and potassium back up to QD. He had tried to reduce the diuresis because of dry mouth and feeling volume depleted.  Thanks

## 2020-05-24 NOTE — Telephone Encounter (Signed)
Spoke with pt's husband and since decreasing Furosemide and K ( 2 times weekly )Pt's feet and ankles are swollen and stay swollen Will forward to Dr Acie Fredrickson for review .Adonis Housekeeper

## 2020-05-24 NOTE — Telephone Encounter (Signed)
Pt's husband aware to have pt resume Furosemide and Potassium daily ./cy

## 2020-05-24 NOTE — Telephone Encounter (Signed)
° ° ° °  Pt's husband calling, would like to speak with Dr. Elmarie Shiley nurse about pt's medications

## 2020-05-25 ENCOUNTER — Telehealth: Payer: Self-pay | Admitting: Internal Medicine

## 2020-05-25 NOTE — Telephone Encounter (Signed)
It looks like they have been talking to psychiatry about sleep medications so should continue to discuss with them

## 2020-05-25 NOTE — Telephone Encounter (Signed)
Are they ok with any sleep medication we try causing dry mouth, possible worsening of memory and possibly making her depression worse.

## 2020-05-25 NOTE — Telephone Encounter (Signed)
   Spouse calling on behalf of patient to ask for prescription for sleep issue,stating Trazodone not helping

## 2020-05-26 NOTE — Telephone Encounter (Signed)
Tried reaching patient today but phone keeps cutting off before I could leave message.

## 2020-05-30 ENCOUNTER — Encounter: Payer: Self-pay | Admitting: Internal Medicine

## 2020-05-30 ENCOUNTER — Ambulatory Visit (INDEPENDENT_AMBULATORY_CARE_PROVIDER_SITE_OTHER): Payer: Medicare Other | Admitting: Internal Medicine

## 2020-05-30 ENCOUNTER — Other Ambulatory Visit: Payer: Self-pay

## 2020-05-30 DIAGNOSIS — G479 Sleep disorder, unspecified: Secondary | ICD-10-CM | POA: Diagnosis not present

## 2020-05-30 DIAGNOSIS — K449 Diaphragmatic hernia without obstruction or gangrene: Secondary | ICD-10-CM | POA: Diagnosis not present

## 2020-05-30 DIAGNOSIS — K219 Gastro-esophageal reflux disease without esophagitis: Secondary | ICD-10-CM | POA: Diagnosis not present

## 2020-05-30 DIAGNOSIS — I5022 Chronic systolic (congestive) heart failure: Secondary | ICD-10-CM

## 2020-05-30 MED ORDER — OMEPRAZOLE 20 MG PO CPDR: 20.0000 mg | DELAYED_RELEASE_CAPSULE | Freq: Every day | ORAL | 1 refills | Status: DC

## 2020-05-30 MED ORDER — TRAZODONE HCL 50 MG PO TABS: 50.0000 mg | ORAL_TABLET | Freq: Every evening | ORAL | 3 refills | Status: DC | PRN

## 2020-05-30 NOTE — Patient Instructions (Addendum)
Increase lasix to 40 mg twice daily x 3 days.  Increase potassium to 1 pill twice daily x 3 days.     Restart omeprazole 20 mg daily.  If your stomach symptoms do not improve consider seeing GI.

## 2020-05-30 NOTE — Assessment & Plan Note (Signed)
Chronic Appears to be fluid overloaded - having orthopnea, leg edema - she does not weigh herself and is very sedentary so unable to gage with SOB Will increase lasix to 40 mg BID x 3 days and increase KCL as well They will let me know if there is no improvement

## 2020-05-30 NOTE — Telephone Encounter (Signed)
Patient seen in office today. 

## 2020-05-30 NOTE — Assessment & Plan Note (Signed)
Trazodone 100 mg is helping  Continue above

## 2020-05-30 NOTE — Progress Notes (Signed)
Subjective:    Patient ID: Tamara Gregory, female    DOB: 04-05-1925, 86 y.o.   MRN: [id number]  HPI The patient is here for an acute visit.   Elastic band feeling around lower chest - under breasts.  It has been there for a while - it has gotten worse.  She also has pain across the lower abdomen.  She denies bloating.  She has decreased appetite and eats minimal amount.  She will vomit a couple of times a week.  She has difficulty swallowing her vitamins.  No gerd.  Some nausea prior to vomiting.    She has chronic leg swelling - this is better since her water pill was increased, but she still has swelling.  She does not walk much but has some SOB with walking.    She is taking trazodone.  It helps.  Her SOB at night is what is now disturbing her sleep. She sometimes has to sit on the side of the bed to get her breath.      Medications and allergies reviewed with patient and updated if appropriate.  Patient Active Problem List   Diagnosis Date Noted  . Rib pain on left side 01/24/2020  . Acute pain of left shoulder 01/24/2020  . Abdominal pain 12/08/2019  . Decreased mobility and endurance 11/15/2019  . Iron deficiency anemia due to chronic blood loss 08/29/2019  . CAD (coronary artery disease) 08/17/2019  . invasive SCC (squamous cell carcinoma), leg, left 07/31/2019  . Hypothyroidism 07/01/2019  . Closed fracture of left hip ([place]) 06/30/2019  . Hair loss 06/29/2019  . Chronic systolic heart failure ([place]) 05/14/2019  . Sleep disturbance 04/29/2019  . Tachycardia   . Steroid-induced hyperglycemia   . Leukocytosis   . E. coli UTI   . Hypoalbuminemia due to protein-calorie malnutrition ([place])   . Ocular myasthenia gravis ([place])   . Hip fracture ([place]) 03/10/2019  . Left displaced femoral neck fracture ([place]) 03/10/2019  . Weakness 12/04/2018  . 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 Right ovarian cyst s/p RSO 03/17/2018  . Diarrhea 12/23/2017    . Large hiatal hernia 04/01/2017  . Sweating profusely 03/19/2017  . Hyperglycemia 03/19/2017  . Squamous cell skin cancer 11/05/2016  . Nosebleed 10/19/2016  . Osteoporosis 08/28/2016  . Essential hypertension, benign 08/21/2016  . Long term current use of systemic steroids 08/21/2016  . Primary open angle glaucoma of both eyes, mild stage 05/14/2016  . Ocular hypertension of left eye 05/08/2016  . GERD (gastroesophageal reflux disease) 05/06/2016  . Macular pucker, bilateral 11/14/2015  . Status post intraocular lens implant 04/04/2015  . Exudative age-related macular degeneration ([place]) 04/04/2015  . Chronic pain syndrome 02/07/2015  . Bilateral nonexudative age-related macular degeneration 01/16/2015  . Presence of intraocular lens 01/16/2015  . Urge incontinence of urine 01/17/2014  . Urinary urgency 01/17/2014  . Malaise and fatigue 10/04/2013  . PAC (premature atrial contraction) 10/04/2013  . Depression 02/15/2013  . Myasthenia gravis ([place]) 03/17/2012  . Spinal stenosis, lumbar 01/14/2012  . Ptosis of eyelid 01/14/2012  . Pure hypercholesterolemia 09/05/2011  . Benign hypertensive heart disease without heart failure 09/05/2011  . Osteoarthritis 09/05/2011    Current Outpatient Medications on File Prior to Visit  Medication Sig Dispense Refill  . acetaminophen (TYLENOL) 325 MG tablet Take 2 tablets (650 mg total) by mouth every 6 (six) hours as needed for headache.    [name] aspirin 81 MG chewable tablet Chew 0.5  tablets (40.5 mg total) by mouth daily. 30 tablet 0  . brimonidine (ALPHAGAN) 0.2 % ophthalmic solution Place 1 drop into both eyes 3 (three) times daily.     . Cholecalciferol (VITAMIN D) 125 MCG (5000 UT) CAPS Take 5,000 Units by mouth daily.     . fluticasone (FLONASE) 50 MCG/ACT nasal spray Place 1 spray into both nostrils at bedtime.    . furosemide (LASIX) 40 MG tablet Take one tablet by mouth twice weekly 30 tablet 3  . latanoprost (XALATAN) 0.005 % ophthalmic  solution Place 1 drop into the left eye at bedtime.  1  . levothyroxine (SYNTHROID) 25 MCG tablet Take 1 tablet by mouth daily before breakfast. 90 tablet 1  . Multiple Vitamins-Minerals (PRESERVISION AREDS) CAPS Take 1 capsule by mouth 2 (two) times daily.     [name] MYRBETRIQ 25 MG TB24 tablet TAKE 1 TABLET BY MOUTH  DAILY NEED OFFICE VISIT FOR MORE REFILLS 90 tablet 1  . Omega-3 Fatty Acids (FISH OIL) 435 MG CAPS Take 435 mg by mouth daily.    [name] omeprazole (PRILOSEC) 20 MG capsule Take 20 mg by mouth as needed.    . potassium chloride SA (KLOR-CON) 20 MEQ tablet Take one tablet by mouth twice weekly 30 tablet 3  . predniSONE (DELTASONE) 5 MG tablet Take 10 mg by mouth daily with breakfast.    . rosuvastatin (CRESTOR) 10 MG tablet Take 1 tablet (10 mg total) by mouth daily. 90 tablet 3  . sertraline (ZOLOFT) 100 MG tablet Take 1 tablet by mouth daily.     No current facility-administered medications on file prior to visit.    Past Medical History:  Diagnosis Date  . Anemia   . Anxiety   . Arthritis   . Cancer (HCC)    hx of skin cancer , colon   . Cataract    removed both eyes   . Chronic back pain    okay with sitting, increased back pain with activity   . Depression   . GERD (gastroesophageal reflux disease)   . Glaucoma   . Headache(784.0)    hx of  . History of kidney stones   . History of migraines   . Hyperlipidemia   . Hypertension   . Macular degeneration   . Myasthenia gravis ([place])   . Occasional tremors   . Pneumonia   . Spinal stenosis   . Thoracic aneurysm    4.4cm; see CT done 01/28/12 and 01/22/13 in [facility].   [name] Uses hearing aid   . Vitamin D deficiency     Past Surgical History:  Procedure Laterality Date  . ABDOMINAL HYSTERECTOMY    . CHOLECYSTECTOMY    . COLON SURGERY     Right colectomy dr. gross 03-17-18  . HIP ARTHROPLASTY Left 03/11/2019   Procedure: ARTHROPLASTY BIPOLAR HIP (HEMIARTHROPLASTY);  Surgeon: Renette Butters, MD;  Location: [facility];  Service:  Orthopedics;  Laterality: Left;  . LUMBAR LAMINECTOMY/DECOMPRESSION MICRODISCECTOMY N/A 02/04/2013   Procedure: CENTRAL DECOMPRESSION/LUMBAR LAMINECTOMY L3-L4 AND L4-L5 2 LEVELS;  Surgeon: Tobi Bastos, MD;  Location: [facility];  Service: Orthopedics;  Laterality: N/A;  . SPINAL CORD STIMULATOR IMPLANT     in right hip but not currently using because it did not help  . TONSILLECTOMY    . UPPER GASTROINTESTINAL ENDOSCOPY    . US ECHOCARDIOGRAPHY  05/18/2007   EF 55-60%    Social History   Socioeconomic History  . Marital status: Married    Spouse  name: Not on file  . Number of children: 3  . Years of education: Not on file  . Highest education level: High school graduate  Occupational History  . Occupation: retired  Tobacco Use  . Smoking status: Never Smoker  . Smokeless tobacco: Never Used  Vaping Use  . Vaping Use: Never used  Substance and Sexual Activity  . Alcohol use: No  . Drug use: No  . Sexual activity: Not Currently  Other Topics Concern  . Not on file  Social History Narrative   Lives with husband in a one story home.  Has 2 sons.  Retired.  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  Education: college.  [employer].       Right handed    Social Determinants of Health   Financial Resource Strain:   . Difficulty of Paying Living Expenses:   Food Insecurity:   . Worried About [name] fundraiser in the Last Year:   . Arboriculturist in the Last Year:   Transportation Needs:   . Film/video editor (Medical):   [name] Lack of Transportation (Non-Medical):   Physical Activity:   . Days of Exercise per Week:   . Minutes of Exercise per Session:   Stress:   . Feeling of Stress :   Social Connections:   . Frequency of Communication with Friends and Family:   . Frequency of Social Gatherings with Friends and Family:   . Attends Religious Services:   . Active Member of Clubs or Organizations:   . Attends Archivist Meetings:   [name]  Marital Status:     Family History  Problem Relation Age of Onset  . Heart attack Father   . Diabetes Father   . Cancer Sister   . Breast cancer Sister   . Cancer Brother   . Lung cancer Brother   . Cancer Sister   . Liver cancer Sister   . Diabetes Sister   . Healthy Son   . Colon polyps Neg Hx   . Colon cancer Neg Hx   . Esophageal cancer Neg Hx   . Rectal cancer Neg Hx   . Stomach cancer Neg Hx   . Adrenal disorder Neg Hx     Review of Systems  Constitutional: Negative for fever.  Respiratory: Positive for cough (some), shortness of breath (with walking) and wheezing (some).        Orthopnea  Cardiovascular: Positive for leg swelling (better). Negative for chest pain.  Gastrointestinal: Positive for abdominal pain, diarrhea (take imodium), nausea and vomiting. Negative for constipation.       Objective:   Vitals:   05/30/20 1114  BP: 130/70  Pulse: 86  Temp: 98 F (36.7 C)  SpO2: 94%   BP Readings from Last 3 Encounters:  05/30/20 130/70  05/19/20 122/76  05/10/20 96/62   Wt Readings from Last 3 Encounters:  05/30/20 160 lb (72.6 kg)  05/10/20 152 lb 4 oz (69.1 kg)  04/04/20 154 lb 1.9 oz (69.9 kg)   Body mass index is 29.26 kg/m.   Physical Exam    Constitutional: chronically ill appearing. No distress.  Head: Normocephalic and atraumatic.  Neck: Neck supple. No tracheal deviation present. No thyromegaly present.  No cervical lymphadenopathy Cardiovascular: Normal rate, regular rhythm and normal heart sounds.  No murmur heard. No carotid bruit .  1 + pitting edema in ankles Pulmonary/Chest: Effort normal.  No respiratory distress. No has no  wheezes. No rales.  Skin: Skin is warm and dry. Not diaphoretic.  Psychiatric: Normal mood and affect. Behavior is normal.       Assessment & Plan:    See Problem List for Assessment and Plan of chronic medical problems.    This visit occurred during the SARS-CoV-2 public health emergency.  Safety  protocols were in place, including screening questions prior to the visit, additional usage of staff PPE, and extensive cleaning of exam room while observing appropriate contact time as indicated for disinfecting solutions.

## 2020-05-30 NOTE — Assessment & Plan Note (Signed)
This may be some of the tightness sensation she feels around her lower chest Monitor for now See if there is improvement with diuresis

## 2020-05-30 NOTE — Assessment & Plan Note (Signed)
?   Silent gerd She has N/V about twice a week and some dysphagia with vitamins Denies gerd Advised to restart omeprazole daily If no improvement advised to see GI

## 2020-06-06 ENCOUNTER — Telehealth: Payer: Self-pay | Admitting: Internal Medicine

## 2020-06-06 MED ORDER — TRAZODONE HCL 50 MG PO TABS: 50.0000 mg | ORAL_TABLET | Freq: Every evening | ORAL | 3 refills | Status: AC | PRN

## 2020-06-06 NOTE — Telephone Encounter (Signed)
traZODone (D ESYREL) 50 MG tablet--refill request  Patient takes 2 at night  [facility], [address] [place], [address] Phone:  [phone]  Fax:  [phone]     Requesting a 90 day supply  Last appt: 6.29.21  Next appt: 7.13.21

## 2020-06-07 DIAGNOSIS — R0602 Shortness of breath: Secondary | ICD-10-CM | POA: Diagnosis not present

## 2020-06-07 DIAGNOSIS — I5032 Chronic diastolic (congestive) heart failure: Secondary | ICD-10-CM | POA: Diagnosis not present

## 2020-06-12 DIAGNOSIS — H401131 Primary open-angle glaucoma, bilateral, mild stage: Secondary | ICD-10-CM | POA: Diagnosis not present

## 2020-06-12 NOTE — Patient Instructions (Addendum)
  Blood work was ordered.     Medications reviewed and updated.  Changes include :   Increase omeprazole to 40 mg daily.    A referral was ordered for Dr Ardis Hughs.        Someone from their office will call you to schedule an appointment.    Please followup in 6 months

## 2020-06-12 NOTE — Progress Notes (Signed)
Subjective:    Patient ID: Tamara Gregory, female    DOB: October 03, 1925, 86 y.o.   MRN: [id number]  HPI The patient is here for follow up of their chronic medical problems, including depression, htn, hypothyroidism, GERD  Anemia worse, tightness in lower chest/upper abdomen - started omeprazole.  She still has some dysphagia.  The tightness has improved.  Her anemia was worse at her last appointment.  She denies blood in the stool or black stool.    Leg swelling  - inceased lasix 40 mg twice daily x 3 days.  Her leg swelling is much better. She is 11 lbs less than she was at her last visit. She no longer has leg swelling.    She still feels depressed.  She is following with psychiatry.       Medications and allergies reviewed with patient and updated if appropriate.  Patient Active Problem List   Diagnosis Date Noted  . Rib pain on left side 01/24/2020  . Acute pain of left shoulder 01/24/2020  . Abdominal pain 12/08/2019  . Decreased mobility and endurance 11/15/2019  . Iron deficiency anemia due to chronic blood loss 08/29/2019  . CAD (coronary artery disease) 08/17/2019  . invasive SCC (squamous cell carcinoma), leg, left 07/31/2019  . Hypothyroidism 07/01/2019  . Closed fracture of left hip ([place]) 06/30/2019  . Hair loss 06/29/2019  . Chronic systolic heart failure ([place]) 05/14/2019  . Sleep disturbance 04/29/2019  . Tachycardia   . Steroid-induced hyperglycemia   . Leukocytosis   . E. coli UTI   . Hypoalbuminemia due to protein-calorie malnutrition ([place])   . Ocular myasthenia gravis ([place])   . Hip fracture ([place]) 03/10/2019  . Left displaced femoral neck fracture ([place]) 03/10/2019  . Weakness 12/04/2018  . 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 Right ovarian cyst s/p RSO 03/17/2018  . Diarrhea 12/23/2017  . Large hiatal hernia 04/01/2017  . Sweating profusely 03/19/2017  . Hyperglycemia 03/19/2017  . Squamous cell skin cancer 11/05/2016  .  Nosebleed 10/19/2016  . Osteoporosis 08/28/2016  . Essential hypertension, benign 08/21/2016  . Long term current use of systemic steroids 08/21/2016  . Primary open angle glaucoma of both eyes, mild stage 05/14/2016  . Ocular hypertension of left eye 05/08/2016  . GERD (gastroesophageal reflux disease) 05/06/2016  . Macular pucker, bilateral 11/14/2015  . Status post intraocular lens implant 04/04/2015  . 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 Chronic pain syndrome 02/07/2015  . Bilateral nonexudative age-related macular degeneration 01/16/2015  . Urge incontinence of urine 01/17/2014  . Urinary urgency 01/17/2014  . Malaise and fatigue 10/04/2013  . PAC (premature atrial contraction) 10/04/2013  . Depression 02/15/2013  . Myasthenia gravis ([place]) 03/17/2012  . Spinal stenosis, lumbar 01/14/2012  . Ptosis of eyelid 01/14/2012  . Pure hypercholesterolemia 09/05/2011  . Benign hypertensive heart disease without heart failure 09/05/2011  . Osteoarthritis 09/05/2011    Current Outpatient Medications on File Prior to Visit  Medication Sig Dispense Refill  . acetaminophen (TYLENOL) 325 MG tablet Take 2 tablets (650 mg total) by mouth every 6 (six) hours as needed for headache.    [name] aspirin 81 MG chewable tablet Chew 0.5 tablets (40.5 mg total) by mouth daily. 30 tablet 0  . brimonidine (ALPHAGAN) 0.2 % ophthalmic solution Place 1 drop into both eyes 3 (three) times daily.     . Cholecalciferol (VITAMIN D) 125 MCG (5000 UT) CAPS Take 5,000 Units by mouth daily.     [name]  fluticasone (FLONASE) 50 MCG/ACT nasal spray Place 1 spray into both nostrils at bedtime.    . furosemide (LASIX) 40 MG tablet Take one tablet by mouth twice weekly (Patient taking differently: 40 mg daily. Take one tablet by mouth daily) 30 tablet 3  . latanoprost (XALATAN) 0.005 % ophthalmic solution Place 1 drop into the left eye at bedtime.  1  . levothyroxine (SYNTHROID) 25 MCG tablet Take 1 tablet  by mouth daily before breakfast. 90 tablet 1  . Multiple Vitamins-Minerals (PRESERVISION AREDS) CAPS Take 1 capsule by mouth 2 (two) times daily.     [name] MYRBETRIQ 25 MG TB24 tablet TAKE 1 TABLET BY MOUTH  DAILY NEED OFFICE VISIT FOR MORE REFILLS 90 tablet 1  . Omega-3 Fatty Acids (FISH OIL) 435 MG CAPS Take 435 mg by mouth daily.    . potassium chloride SA (KLOR-CON) 20 MEQ tablet Take one tablet by mouth twice weekly (Patient taking differently: 20 mEq daily. Take one tablet by mouth daily) 30 tablet 3  . predniSONE (DELTASONE) 5 MG tablet Take 10 mg by mouth daily with breakfast.    . rosuvastatin (CRESTOR) 10 MG tablet Take 1 tablet (10 mg total) by mouth daily. 90 tablet 3  . sertraline (ZOLOFT) 100 MG tablet Take 1 tablet by mouth daily.    . traZODone (DESYREL) 50 MG tablet Take 1-2 tablets (50-100 mg total) by mouth at bedtime as needed for sleep. 180 tablet 3   No current facility-administered medications on file prior to visit.    Past Medical History:  Diagnosis Date  . Anemia   . Anxiety   . Arthritis   . Cancer (HCC)    hx of skin cancer , colon   . Cataract    removed both eyes   . Chronic back pain    okay with sitting, increased back pain with activity   . Depression   . GERD (gastroesophageal reflux disease)   . Glaucoma   . Headache(784.0)    hx of  . History of kidney stones   . History of migraines   . Hyperlipidemia   . Hypertension   . Macular degeneration   . Myasthenia gravis ([place])   . Occasional tremors   . Pneumonia   . Spinal stenosis   . Thoracic aneurysm    4.4cm; see CT done 01/28/12 and 01/22/13 in [facility].   [name] Uses hearing aid   . Vitamin D deficiency     Past Surgical History:  Procedure Laterality Date  . ABDOMINAL HYSTERECTOMY    . CHOLECYSTECTOMY    . COLON SURGERY     Right colectomy dr. gross 03-17-18  . HIP ARTHROPLASTY Left 03/11/2019   Procedure: ARTHROPLASTY BIPOLAR HIP (HEMIARTHROPLASTY);  Surgeon: Renette Butters, MD;  Location:  [facility];  Service: Orthopedics;  Laterality: Left;  . LUMBAR LAMINECTOMY/DECOMPRESSION MICRODISCECTOMY N/A 02/04/2013   Procedure: CENTRAL DECOMPRESSION/LUMBAR LAMINECTOMY L3-L4 AND L4-L5 2 LEVELS;  Surgeon: Tobi Bastos, MD;  Location: [facility];  Service: Orthopedics;  Laterality: N/A;  . SPINAL CORD STIMULATOR IMPLANT     in right hip but not currently using because it did not help  . TONSILLECTOMY    . UPPER GASTROINTESTINAL ENDOSCOPY    . US ECHOCARDIOGRAPHY  05/18/2007   EF 55-60%    Social History   Socioeconomic History  . Marital status: Married    Spouse name: Not on file  . Number of children: 3  . Years of education: Not on file  .  Highest education level: High school graduate  Occupational History  . Occupation: retired  Tobacco Use  . Smoking status: Never Smoker  . Smokeless tobacco: Never Used  Vaping Use  . Vaping Use: Never used  Substance and Sexual Activity  . Alcohol use: No  . Drug use: No  . Sexual activity: Not Currently  Other Topics Concern  . Not on file  Social History Narrative   Lives with husband in a one story home.  Has 2 sons.  Retired.  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  Education: college.  [employer].       Right handed    Social Determinants of Health   Financial Resource Strain:   . Difficulty of Paying Living Expenses:   Food Insecurity:   . Worried About [name] fundraiser in the Last Year:   . Arboriculturist in the Last Year:   Transportation Needs:   . Film/video editor (Medical):   [name] Lack of Transportation (Non-Medical):   Physical Activity:   . Days of Exercise per Week:   . Minutes of Exercise per Session:   Stress:   . Feeling of Stress :   Social Connections:   . Frequency of Communication with Friends and Family:   . Frequency of Social Gatherings with Friends and Family:   . Attends Religious Services:   . Active Member of Clubs or Organizations:   . Attends Archivist  Meetings:   [name] Marital Status:     Family History  Problem Relation Age of Onset  . Heart attack Father   . Diabetes Father   . Cancer Sister   . Breast cancer Sister   . Cancer Brother   . Lung cancer Brother   . Cancer Sister   . Liver cancer Sister   . Diabetes Sister   . Healthy Son   . Colon polyps Neg Hx   . Colon cancer Neg Hx   . Esophageal cancer Neg Hx   . Rectal cancer Neg Hx   . Stomach cancer Neg Hx   . Adrenal disorder Neg Hx     Review of Systems  Constitutional: Negative for fever.  Respiratory: Positive for shortness of breath. Negative for cough and wheezing.   Cardiovascular: Positive for chest pain (right side). Negative for palpitations and leg swelling (controlled).  Gastrointestinal: Positive for abdominal pain (uperr abdomen tightnesss) and nausea. Negative for blood in stool (no black stool).  Neurological: Positive for light-headedness (some) and headaches.  Psychiatric/Behavioral: Positive for dysphoric mood and sleep disturbance.       Objective:   Vitals:   06/13/20 1343  BP: 118/74  Pulse: 80  Temp: 98.3 F (36.8 C)  SpO2: 94%   BP Readings from Last 3 Encounters:  06/13/20 118/74  05/30/20 130/70  05/19/20 122/76   Wt Readings from Last 3 Encounters:  06/13/20 149 lb (67.6 kg)  05/30/20 160 lb (72.6 kg)  05/10/20 152 lb 4 oz (69.1 kg)   Body mass index is 27.25 kg/m.   Physical Exam    Constitutional: Appears well-developed and well-nourished. No distress.  HENT:  Head: Normocephalic and atraumatic.  Neck: Neck supple. No tracheal deviation present. No thyromegaly present.  No cervical lymphadenopathy Cardiovascular: Normal rate, regular rhythm and normal heart sounds.   No murmur heard. No carotid bruit .  No edema Pulmonary/Chest: Effort normal and breath sounds normal. No respiratory distress. No has no wheezes. No rales.  Abdomen: soft, ? Mild tenderness epigastric region, no R/G Skin: Skin is warm and dry. Not  diaphoretic.  Psychiatric: Normal mood and affect. Behavior is normal.      Assessment & Plan:    See Problem List for Assessment and Plan of chronic medical problems.    This visit occurred during the SARS-CoV-2 public health emergency.  Safety protocols were in place, including screening questions prior to the visit, additional usage of staff PPE, and extensive cleaning of exam room while observing appropriate contact time as indicated for disinfecting solutions.

## 2020-06-13 ENCOUNTER — Ambulatory Visit (INDEPENDENT_AMBULATORY_CARE_PROVIDER_SITE_OTHER): Payer: Medicare Other | Admitting: Internal Medicine

## 2020-06-13 ENCOUNTER — Other Ambulatory Visit (INDEPENDENT_AMBULATORY_CARE_PROVIDER_SITE_OTHER): Payer: Medicare Other

## 2020-06-13 ENCOUNTER — Encounter: Payer: Self-pay | Admitting: Internal Medicine

## 2020-06-13 ENCOUNTER — Other Ambulatory Visit: Payer: Self-pay

## 2020-06-13 VITALS — BP 118/74 | HR 80 | Temp 98.3°F | Ht 62.0 in | Wt 149.0 lb

## 2020-06-13 DIAGNOSIS — D500 Iron deficiency anemia secondary to blood loss (chronic): Secondary | ICD-10-CM | POA: Diagnosis not present

## 2020-06-13 DIAGNOSIS — N3941 Urge incontinence: Secondary | ICD-10-CM

## 2020-06-13 DIAGNOSIS — E7800 Pure hypercholesterolemia, unspecified: Secondary | ICD-10-CM

## 2020-06-13 DIAGNOSIS — K219 Gastro-esophageal reflux disease without esophagitis: Secondary | ICD-10-CM

## 2020-06-13 DIAGNOSIS — E039 Hypothyroidism, unspecified: Secondary | ICD-10-CM | POA: Diagnosis not present

## 2020-06-13 DIAGNOSIS — G479 Sleep disorder, unspecified: Secondary | ICD-10-CM | POA: Diagnosis not present

## 2020-06-13 DIAGNOSIS — I5022 Chronic systolic (congestive) heart failure: Secondary | ICD-10-CM

## 2020-06-13 DIAGNOSIS — I10 Essential (primary) hypertension: Secondary | ICD-10-CM

## 2020-06-13 DIAGNOSIS — F3289 Other specified depressive episodes: Secondary | ICD-10-CM

## 2020-06-13 LAB — CBC WITH DIFFERENTIAL/PLATELET
Basophils Absolute: 0 10*3/uL (ref 0.0–0.1)
Basophils Relative: 0.2 % (ref 0.0–3.0)
Eosinophils Absolute: 0 10*3/uL (ref 0.0–0.7)
Eosinophils Relative: 0.2 % (ref 0.0–5.0)
HCT: 34.2 % — ABNORMAL LOW (ref 36.0–46.0)
Hemoglobin: 11.5 g/dL — ABNORMAL LOW (ref 12.0–15.0)
Lymphocytes Relative: 3.9 % — ABNORMAL LOW (ref 12.0–46.0)
Lymphs Abs: 0.4 10*3/uL — ABNORMAL LOW (ref 0.7–4.0)
MCHC: 33.6 g/dL (ref 30.0–36.0)
MCV: 98.7 fl (ref 78.0–100.0)
Monocytes Absolute: 0.9 10*3/uL (ref 0.1–1.0)
Monocytes Relative: 9.5 % (ref 3.0–12.0)
Neutro Abs: 8.6 10*3/uL — ABNORMAL HIGH (ref 1.4–7.7)
Neutrophils Relative %: 86.2 % — ABNORMAL HIGH (ref 43.0–77.0)
Platelets: 186 10*3/uL (ref 150.0–400.0)
RBC: 3.46 Mil/uL — ABNORMAL LOW (ref 3.87–5.11)
RDW: 15.1 % (ref 11.5–15.5)
WBC: 9.9 10*3/uL (ref 4.0–10.5)

## 2020-06-13 MED ORDER — OMEPRAZOLE 40 MG PO CPDR: 40.0000 mg | DELAYED_RELEASE_CAPSULE | Freq: Every day | ORAL | 1 refills | Status: DC

## 2020-06-13 NOTE — Assessment & Plan Note (Signed)
Chronic BP well controlled Current regimen effective and well tolerated Continue current medications at current doses  

## 2020-06-13 NOTE — Assessment & Plan Note (Signed)
Chronic Continue current dose of levothyroxine 

## 2020-06-13 NOTE — Assessment & Plan Note (Signed)
Chronic Has not been controlled Is currently following with psychiatry and advised that she follow-up with them Continue sertraline 100 mg daily

## 2020-06-13 NOTE — Assessment & Plan Note (Signed)
Chronic Lipids have been controlled Continue Crestor 10 mg daily

## 2020-06-13 NOTE — Assessment & Plan Note (Signed)
Chronic Edema and fluid overload improved after 3 days of Lasix twice daily and potassium twice daily Leg edema resolved Lost 11 pounds Continue Lasix and potassium daily

## 2020-06-13 NOTE — Assessment & Plan Note (Signed)
Acute on chronic Anemia has worsened Check CBC today to make sure there has not been further decline Started omeprazole 20 mg daily at her last visit due to GERD and concern for possible upper GI bleed Increase to 40 mg daily Will refer to Dr. Ardis Hughs for further evaluation

## 2020-06-13 NOTE — Assessment & Plan Note (Signed)
Chronic - worse recently Start omeprazole 20 mg daily at her last visit-improvement in epigastric pain, tightness in lower chest/upper abdomen and dysphagia, but still having symptoms Anemia worse at her last visit Concern for upper GI bleed Increase omeprazole to 40 mg daily Refer to Dr. Ardis Hughs for further evaluation Recheck CBC to make sure blood counts have not worsened

## 2020-06-13 NOTE — Assessment & Plan Note (Signed)
Chronic Continue Myrbetriq

## 2020-06-13 NOTE — Assessment & Plan Note (Signed)
Chronic Sleep improved with trazodone 100 mg nightly Continue

## 2020-06-14 LAB — IRON,TIBC AND FERRITIN PANEL
%SAT: 17 % (calc) (ref 16–45)
Ferritin: 45 ng/mL (ref 16–288)
Iron: 73 ug/dL (ref 45–160)
TIBC: 435 mcg/dL (calc) (ref 250–450)

## 2020-06-26 ENCOUNTER — Telehealth: Payer: Self-pay

## 2020-06-26 NOTE — Telephone Encounter (Signed)
Patients husband would like a call back states the patient is in really bad shape.

## 2020-06-26 NOTE — Telephone Encounter (Signed)
-----   Message from Milus Banister, MD sent at 06/26/2020  5:14 AM EDT ----- Regarding: RE: urgent office visit THanks  Looks like she has an appt with me in Sept.  Louay Myrie, please put her on whatever wait list we have for sooner appt with me or extender.  Thanks  DJ ----- Message ----- From: Alfredia Ferguson, PA-C Sent: 06/24/2020   1:01 PM EDT To: Milus Banister, MD, Timothy Lasso, RN Subject: urgent office visit                            Patient's husband called this morning, multiple concerns about his wife and stated that they have been trying to get into see Dr. Ardis Hughs and are supposed to be on a cancellation list.  It sounds like she is having failure to thrive, with lots of difficulty eating, some dysphagia, intermittent nausea and vomiting.  We discussed ER evaluation with potential admission, however husband did not want to do that yet.  He says she is able to keep liquids down and is drinking Ensure.  He is asking for appointment next week.  Please see if they can be worked in with Dr. Ardis Hughs or  an APP next week, sooner the better.  Patient's husband expecting call back on Monday- thanks

## 2020-06-26 NOTE — Telephone Encounter (Signed)
The pt has been moved to 07/05/20 at 130 pm with Colleen.  I have left a message with the pt husband making him aware of the appt.

## 2020-06-28 ENCOUNTER — Ambulatory Visit: Payer: Medicare Other | Admitting: Physician Assistant

## 2020-06-28 ENCOUNTER — Other Ambulatory Visit (INDEPENDENT_AMBULATORY_CARE_PROVIDER_SITE_OTHER): Payer: Medicare Other

## 2020-06-28 ENCOUNTER — Encounter: Payer: Self-pay | Admitting: Physician Assistant

## 2020-06-28 VITALS — BP 110/74 | HR 76 | Ht 63.0 in | Wt 149.0 lb

## 2020-06-28 DIAGNOSIS — R634 Abnormal weight loss: Secondary | ICD-10-CM

## 2020-06-28 DIAGNOSIS — R627 Adult failure to thrive: Secondary | ICD-10-CM

## 2020-06-28 DIAGNOSIS — R110 Nausea: Secondary | ICD-10-CM | POA: Diagnosis not present

## 2020-06-28 DIAGNOSIS — R1030 Lower abdominal pain, unspecified: Secondary | ICD-10-CM

## 2020-06-28 DIAGNOSIS — R1310 Dysphagia, unspecified: Secondary | ICD-10-CM

## 2020-06-28 LAB — CBC WITH DIFFERENTIAL/PLATELET
Basophils Absolute: 0 10*3/uL (ref 0.0–0.1)
Basophils Relative: 0.2 % (ref 0.0–3.0)
Eosinophils Absolute: 0 10*3/uL (ref 0.0–0.7)
Eosinophils Relative: 0.5 % (ref 0.0–5.0)
HCT: 29.5 % — ABNORMAL LOW (ref 36.0–46.0)
Hemoglobin: 9.9 g/dL — ABNORMAL LOW (ref 12.0–15.0)
Lymphocytes Relative: 5.6 % — ABNORMAL LOW (ref 12.0–46.0)
Lymphs Abs: 0.5 10*3/uL — ABNORMAL LOW (ref 0.7–4.0)
MCHC: 33.6 g/dL (ref 30.0–36.0)
MCV: 97.8 fl (ref 78.0–100.0)
Monocytes Absolute: 1.5 10*3/uL — ABNORMAL HIGH (ref 0.1–1.0)
Monocytes Relative: 18.6 % — ABNORMAL HIGH (ref 3.0–12.0)
Neutro Abs: 6.1 10*3/uL (ref 1.4–7.7)
Neutrophils Relative %: 75.1 % (ref 43.0–77.0)
Platelets: 214 10*3/uL (ref 150.0–400.0)
RBC: 3.02 Mil/uL — ABNORMAL LOW (ref 3.87–5.11)
RDW: 15.7 % — ABNORMAL HIGH (ref 11.5–15.5)
WBC: 8.2 10*3/uL (ref 4.0–10.5)

## 2020-06-28 LAB — SEDIMENTATION RATE: Sed Rate: 46 mm/hr — ABNORMAL HIGH (ref 0–30)

## 2020-06-28 LAB — BASIC METABOLIC PANEL
BUN: 25 mg/dL — ABNORMAL HIGH (ref 6–23)
CO2: 36 mEq/L — ABNORMAL HIGH (ref 19–32)
Calcium: 9.5 mg/dL (ref 8.4–10.5)
Chloride: 97 mEq/L (ref 96–112)
Creatinine, Ser: 0.75 mg/dL (ref 0.40–1.20)
GFR: 72.05 mL/min (ref >60.00)
Glucose, Bld: 147 mg/dL — ABNORMAL HIGH (ref 70–99)
Potassium: 4.4 mEq/L (ref 3.5–5.1)
Sodium: 138 mEq/L (ref 135–145)

## 2020-06-28 LAB — BRAIN NATRIURETIC PEPTIDE: Pro B Natriuretic peptide (BNP): 968 pg/mL — ABNORMAL HIGH (ref 0.0–100.0)

## 2020-06-28 MED ORDER — ONDANSETRON HCL 4 MG PO TABS
4.0000 mg | ORAL_TABLET | Freq: Four times a day (QID) | ORAL | 1 refills | Status: DC | PRN
Start: 2020-06-28 — End: 2020-07-13

## 2020-06-28 NOTE — Progress Notes (Signed)
Subjective:    Patient ID: Tamara Gregory, female    DOB: [birth year], 86 y.o.   MRN: [id number]  HPI Tamara Gregory is a pleasant 86 year old white female, established with Dr. Ardis Hughs, who was last seen in our office in September 2020.  She comes in today with multiple issues.  Her husband is concerned that she has not been doing well in general over the past couple of months.  He is been having a significant amount of difficulty eating, and he says that she really has not been eating much at all over the past weeks.  She complains of difficulty with dysphagia especially to bread and has some sensation of food being slow to traverse the esophagus and sometimes having a sensation of getting stuck.  She does okay with liquids but really has no appetite.  She says even with soups etc. things will "get bigger in my mouth".  No heartburn or indigestion or odynophagia.  She has been feeling nauseated frequently but has not had vomiting.  She is also having lower abdominal discomfort bilaterally over the past several months.  Bowel movements have been okay, occasionally using Imodium no melena or hematochezia noted. Patient's husband says really all she has been eating most days is a couple of ensures and some fluids. She does have history of colon cancer/cecal diagnosed 2019 and also had adenomatous polyps at that time.  She underwent right hemicolectomy and was found to have a T3 N0 M0 lesion. She also has history of myasthenia gravis, is on chronic low-dose steroids, prior hip fracture, coronary artery disease, congestive heart failure and hypertension. When she was seen by Dr. Quay Burow over the past month she was felt to have volume overload as she had developed some lower extremity edema.  BNP was checked and was elevated at 1426.  Lasix was increased for several days and then resumed her usual dose.  Her husband says she has not had any significant peripheral edema over the past couple of weeks, at one point she had  lost 11 pounds but has stabilized. Patient also admits that she has been depressed, and "despondent" over her life.  Her husband feels that she has been miserable.  She is currently on Zoloft which they do not think is making much difference. Labs 05/19/2020 hemoglobin 10.3 MCV of 101 B12 within normal limits, BNP 1426, LFTs within normal limits. Repeat labs 06/13/2020 hemoglobin 11.5 she is not iron deficient. She is not had any recent imaging. Last EGD was done April 2018 with finding of a large hiatal hernia and one Cameron erosion, no stricture. When she was last seen in our office she had clearly stated that she did not want to undergo any follow-up colonoscopy or procedures.  Review of Systems Pertinent positive and negative review of systems were noted in the above HPI section.  All other review of systems was otherwise negative.  Outpatient Encounter Medications as of 06/28/2020  Medication Sig  . acetaminophen (TYLENOL) 325 MG tablet Take 2 tablets (650 mg total) by mouth every 6 (six) hours as needed for headache.  [name] aspirin 81 MG chewable tablet Chew 0.5 tablets (40.5 mg total) by mouth daily.  . brimonidine (ALPHAGAN) 0.2 % ophthalmic solution Place 1 drop into both eyes 3 (three) times daily.   . Cholecalciferol (VITAMIN D) 125 MCG (5000 UT) CAPS Take 5,000 Units by mouth daily.   . fluticasone (FLONASE) 50 MCG/ACT nasal spray Place 1 spray into both nostrils at bedtime.  . furosemide (  LASIX) 40 MG tablet Take one tablet by mouth twice weekly (Patient taking differently: 40 mg daily. Take one tablet by mouth daily)  . latanoprost (XALATAN) 0.005 % ophthalmic solution Place 1 drop into the left eye at bedtime.  [name] levothyroxine (SYNTHROID) 25 MCG tablet Take 1 tablet by mouth daily before breakfast.  . Multiple Vitamins-Minerals (PRESERVISION AREDS) CAPS Take 1 capsule by mouth 2 (two) times daily.   . Omega-3 Fatty Acids (FISH OIL) 435 MG CAPS Take 435 mg by mouth daily.  [name] omeprazole  (PRILOSEC) 40 MG capsule Take 1 capsule (40 mg total) by mouth daily. (Patient taking differently: Take 80 mg by mouth daily. Patient takes twice daily)  . potassium chloride SA (KLOR-CON) 20 MEQ tablet Take one tablet by mouth twice weekly (Patient taking differently: 20 mEq daily. Take one tablet by mouth daily)  . predniSONE (DELTASONE) 5 MG tablet Take 10 mg by mouth daily with breakfast.  . rosuvastatin (CRESTOR) 10 MG tablet Take 1 tablet (10 mg total) by mouth daily.  . sertraline (ZOLOFT) 100 MG tablet Take 1 tablet by mouth daily.  . traZODone (DESYREL) 50 MG tablet Take 1-2 tablets (50-100 mg total) by mouth at bedtime as needed for sleep.  [name] ondansetron (ZOFRAN) 4 MG tablet Take 1 tablet (4 mg total) by mouth every 6 (six) hours as needed for nausea or vomiting.  . [DISCONTINUED] MYRBETRIQ 25 MG TB24 tablet TAKE 1 TABLET BY MOUTH  DAILY NEED OFFICE VISIT FOR MORE REFILLS   No facility-administered encounter medications on file as of 06/28/2020.   Allergies  Allergen Reactions  . Tramadol Other (See Comments)    delirium   Patient Active Problem List   Diagnosis Date Noted  . Rib pain on left side 01/24/2020  . Acute pain of left shoulder 01/24/2020  . Abdominal pain 12/08/2019  . Decreased mobility and endurance 11/15/2019  . Iron deficiency anemia due to chronic blood loss 08/29/2019  . CAD (coronary artery disease) 08/17/2019  . invasive SCC (squamous cell carcinoma), leg, left 07/31/2019  . Hypothyroidism 07/01/2019  . Closed fracture of left hip ([place]) 06/30/2019  . Hair loss 06/29/2019  . Chronic systolic heart failure ([place]) 05/14/2019  . Sleep disturbance 04/29/2019  . Tachycardia   . Steroid-induced hyperglycemia   . Leukocytosis   . E. coli UTI   . Hypoalbuminemia due to protein-calorie malnutrition ([place])   . Ocular myasthenia gravis ([place])   . Hip fracture ([place]) 03/10/2019  . Left displaced femoral neck fracture ([place]) 03/10/2019  . Weakness 12/04/2018  . Cancer  of cecum s/p robotic right colectomy 03/17/2018 03/17/2018  . Right ovarian cyst s/p RSO 03/17/2018  . Diarrhea 12/23/2017  . Large hiatal hernia 04/01/2017  . Sweating profusely 03/19/2017  . Hyperglycemia 03/19/2017  . Squamous cell skin cancer 11/05/2016  . Nosebleed 10/19/2016  . Osteoporosis 08/28/2016  . Essential hypertension, benign 08/21/2016  . Long term current use of systemic steroids 08/21/2016  . Primary open angle glaucoma of both eyes, mild stage 05/14/2016  . Ocular hypertension of left eye 05/08/2016  . GERD (gastroesophageal reflux disease) 05/06/2016  . Macular pucker, bilateral 11/14/2015  . Status post intraocular lens implant 04/04/2015  . Exudative age-related macular degeneration ([place]) 04/04/2015  . Chronic pain syndrome 02/07/2015  . Bilateral nonexudative age-related macular degeneration 01/16/2015  . Urge incontinence of urine 01/17/2014  . Urinary urgency 01/17/2014  . Malaise and fatigue 10/04/2013  . PAC (premature atrial contraction) 10/04/2013  . Depression 02/15/2013  .  Myasthenia gravis ([place]) 03/17/2012  . Spinal stenosis, lumbar 01/14/2012  . Ptosis of eyelid 01/14/2012  . Pure hypercholesterolemia 09/05/2011  . Benign hypertensive heart disease without heart failure 09/05/2011  . Osteoarthritis 09/05/2011   Social History   Socioeconomic History  . Marital status: Married    Spouse name: Not on file  . Number of children: 3  . Years of education: Not on file  . Highest education level: High school graduate  Occupational History  . Occupation: retired  Tobacco Use  . Smoking status: Never Smoker  . Smokeless tobacco: Never Used  Vaping Use  . Vaping Use: Never used  Substance and Sexual Activity  . Alcohol use: No  . Drug use: No  . Sexual activity: Not Currently  Other Topics Concern  . Not on file  Social History Narrative   Lives with husband in a one story home.  Has 2 sons.  Retired.  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  Education: college.  [employer].       Right handed    Social Determinants of Health   Financial Resource Strain:   . Difficulty of Paying Living Expenses:   Food Insecurity:   . Worried About [name] fundraiser in the Last Year:   . Arboriculturist in the Last Year:   Transportation Needs:   . Film/video editor (Medical):   [name] Lack of Transportation (Non-Medical):   Physical Activity:   . Days of Exercise per Week:   . Minutes of Exercise per Session:   Stress:   . Feeling of Stress :   Social Connections:   . Frequency of Communication with Friends and Family:   . Frequency of Social Gatherings with Friends and Family:   . Attends Religious Services:   . Active Member of Clubs or Organizations:   . Attends Archivist Meetings:   [name] Marital Status:   Intimate Partner Violence:   . Fear of Current or Ex-Partner:   . Emotionally Abused:   [name] Physically Abused:   . Sexually Abused:     Ms. Formica's family history includes Breast cancer in her sister; Cancer in her brother, sister, and sister; Diabetes in her father and sister; Healthy in her son; Heart attack in her father; Liver cancer in her sister; Lung cancer in her brother.      Objective:    Vitals:   06/28/20 0929  BP: 110/74  Pulse: 76    Physical Exam Well-developed well-nourished elderly white female, who comes in in a wheelchair, with oxygen.  In no acute distress.  Accompanied by her husband height, Weight, 149  BMI 26  HEENT; nontraumatic normocephalic, EOMI, PE R LA, sclera anicteric. Oropharynx; not examined Neck; supple, no JVD Cardiovascular; regular rate and rhythm with S1-S2, no murmur rub or gallop Pulmonary; Clear bilaterally Abdomen; soft, protuberant, nondistended, no palpable mass or hepatosplenomegaly, she has mild tenderness bilateral lower quadrants bowel sounds are active Rectal; not done today Skin; benign exam, no jaundice rash or appreciable  lesions Extremities; trace edema bilateral lower extremities Neuro/Psych; alert and oriented x4, grossly nonfocal mood and affect appropriate       Assessment & Plan:   #36 86 year old white female with multiple comorbidities, who has had a general decline over the past couple of months.  She has had some issues with congestive heart failure/volume overload, is now using her oxygen regularly. Her appetite has been very poor, she complains  of nausea without vomiting, and dysphagia to solids.  It is not clear whether this may be motility related with underlying myasthenia gravis, cannot rule out stricture but EGD in 2018 showed a large hiatal hernia without stricture. I suspect some of the dysphagia may be more food aversion related.  #2.  Lower abdominal pain times several months-etiology not clear, rule out recurrence of colon cancer, other malignancy,  #3 Anemia, macrocytic no iron deficiency-stable #4 congestive heart failure with recent exacerbation #5 coronary artery disease #6 myasthenia gravis on low-dose steroids #7 history of hypertension #8 depression  Plan; CBC with differential, be met, repeat BNP, sed rate. Patient will be scheduled for barium swallow with tablet. Patient will be scheduled for CT of the abdomen and pelvis with contrast. Patient is high risk candidate for any endoscopic evaluation, and does not wish to proceed with any endoscopic evaluation. Start Zofran 4 mg every 6 hours as needed for nausea. We discussed full liquid diet and continued use of supplements, try to increase Ensure to 3 times daily. Current regimen for depression is not effective, will defer to primary care/psychiatry. Will await above studies, With multiple comorbidities and general failure to thrive, we may be approaching time for palliative care, I think goals of care discussion would be helpful.  I will communicate with Dr. Quay Burow.  We have also made follow-up appointment for patient with Dr.  Quay Burow. Jayleana Colberg S Syvilla Martin PA-C 06/28/2020   Cc: Binnie Rail, MD

## 2020-06-28 NOTE — Progress Notes (Signed)
I agree with the above note, plan 

## 2020-06-28 NOTE — Patient Instructions (Addendum)
If you are age 84 or older, your body mass index should be between 23-30. Your Body mass index is 26.39 kg/m. If this is out of the aforementioned range listed, please consider follow up with your Primary Care Provider.  If you are age 106 or younger, your body mass index should be between 19-25. Your Body mass index is 26.39 kg/m. If this is out of the aformentioned range listed, please consider follow up with your Primary Care Provider.   You have been scheduled for a CT scan of the abdomen and pelvis at [facility], 1st floor Radiology. You are scheduled on 07/07/20  at 2:30. You should arrive 15 minutes prior to your appointment time for registration. Please follow the written instructions below on the day of your exam:    1) Do not eat anything after 10:30 am (4 hours prior to your test)  2) You will need to go to the Radiology department to pick up contrast (At least 3 days prior to your procedure you will need to pick up) 2 bottles of oral contrast to drink.  The solution may taste better if refrigerated, but do NOT add ice or any other liquid to this solution. Shake well before drinking.   Drink 1 bottle of contrast @ 12:30 pm (2 hours prior to your exam)  Drink 1 bottle of contrast @ 1:30 pm (1 hour prior to your exam)   You may take any medications as prescribed with a small amount of water, if necessary. If you take any of the following medications: METFORMIN, GLUCOPHAGE, GLUCOVANCE, AVANDAMET, RIOMET, FORTAMET, [place] MET, JANUMET, GLUMETZA or METAGLIP, you MAY be asked to HOLD this medication 48 hours AFTER the exam.   The purpose of you drinking the oral contrast is to aid in the visualization of your intestinal tract. The contrast solution may cause some diarrhea. Depending on your individual set of symptoms, you may also receive an intravenous injection of x-ray contrast/dye. Plan on being at [facility] for 45 minutes or longer, depending on the type of exam you are having  performed.   If you have any questions regarding your exam or if you need to reschedule, you may call [facility] at [phone] between the hours of 8:00 am and 5:00 pm, Monday-Friday.   You have been scheduled for a Barium Esophogram at [facility] (1st floor of the hospital) on 07/05/20 at 10:30 am. Please arrive 15 minutes prior to your appointment for registration. Make certain not to have anything to eat or drink 3 hours prior to your test. If you need to reschedule for any reason, please contact radiology at [phone] to do so. __________________________________________________________________ A barium swallow is an examination that concentrates on views of the esophagus. This tends to be a double contrast exam (barium and two liquids which, when combined, create a gas to distend the wall of the oesophagus) or single contrast (non-ionic iodine based). The study is usually tailored to your symptoms so a good history is essential. Attention is paid during the study to the form, structure and configuration of the esophagus, looking for functional disorders (such as aspiration, dysphagia, achalasia, motility and reflux) EXAMINATION You may be asked to change into a gown, depending on the type of swallow being performed. A radiologist and radiographer will perform the procedure. The radiologist will advise you of the type of contrast selected for your procedure and direct you during the exam. You will be asked to stand, sit or lie in  several different positions and to hold a small amount of fluid in your mouth before being asked to swallow while the imaging is performed .In some instances you may be asked to swallow barium coated marshmallows to assess the motility of a solid food bolus. The exam can be recorded as a digital or video fluoroscopy procedure. POST PROCEDURE It will take 1-2 days for the barium to pass through your system. To facilitate this, it is important, unless  otherwise directed, to increase your fluids for the next 24-48hrs and to resume your normal diet.  This test typically takes about 30 minutes to perform. __________________________________________________________________________________  Tamara Gregory have been schedule to have an appointment with Tamara Gosling, MD for Friday June 30, 2020 at 1:45.  Your provider has requested that you go to the basement level for lab work before leaving today. Press "B" on the elevator. The lab is located at the first door on the left as you exit the elevator.  START Zofran 4 mg 1 tablet by mouth every 6 hours as need for nausea.  Follow up pending the results of imaging studies and labs.

## 2020-06-29 NOTE — Progress Notes (Signed)
Subjective:    Patient ID: Tamara Gregory, female    DOB: 1925-08-28, 86 y.o.   MRN: [id number]  HPI The patient is here for an acute visit for not eating, weight loss.  Her husband is with her.    She just saw GI and is scheduled for an EGD and CT scan with contrast next week.    BNP still elelvated but better.  Anemia much worse 7/13 - > 7/28.  She denies foot swelling.  Her SOB is chronic and unchanged.     She feels terrible.   Medications and allergies reviewed with patient and updated if appropriate.  Patient Active Problem List   Diagnosis Date Noted   Rib pain on left side 01/24/2020   Acute pain of left shoulder 01/24/2020   Abdominal pain 12/08/2019   Decreased mobility and endurance 11/15/2019   Iron deficiency anemia due to chronic blood loss 08/29/2019   CAD (coronary artery disease) 08/17/2019   invasive SCC (squamous cell carcinoma), leg, left 07/31/2019   Hypothyroidism 07/01/2019   Closed fracture of left hip ([place]) 06/30/2019   Hair loss 05/31/1600   Chronic systolic heart failure ([place]) 05/14/2019   Sleep disturbance 04/29/2019   Tachycardia    Steroid-induced hyperglycemia    Leukocytosis    E. coli UTI    Hypoalbuminemia due to protein-calorie malnutrition ([place])    Ocular myasthenia gravis ([place])    Hip fracture ([place]) 03/10/2019   Left displaced femoral neck fracture ([place]) 03/10/2019   Weakness 12/04/2018   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 03/17/2018   Right ovarian cyst s/p RSO 03/17/2018   Diarrhea 12/23/2017   Large hiatal hernia 04/01/2017   Sweating profusely 03/19/2017   Hyperglycemia 03/19/2017   Squamous cell skin cancer 11/05/2016   Nosebleed 10/19/2016   Osteoporosis 08/28/2016   Essential hypertension, benign 08/21/2016   Long term current use of systemic steroids 08/21/2016   Primary open angle glaucoma of both eyes, mild stage 05/14/2016   Ocular hypertension of left eye  05/08/2016   GERD (gastroesophageal reflux disease) 05/06/2016   Macular pucker, bilateral 11/14/2015   Status post intraocular lens implant 04/04/2015   Exudative age-related macular degeneration ([place]) 04/04/2015   Chronic pain syndrome 02/07/2015   Bilateral nonexudative age-related macular degeneration 01/16/2015   Urge incontinence of urine 01/17/2014   Urinary urgency 01/17/2014   Malaise and fatigue 10/04/2013   PAC (premature atrial contraction) 10/04/2013   Depression 02/15/2013   Myasthenia gravis ([place]) 03/17/2012   Spinal stenosis, lumbar 01/14/2012   Ptosis of eyelid 01/14/2012   Pure hypercholesterolemia 09/05/2011   Benign hypertensive heart disease without heart failure 09/05/2011   Osteoarthritis 09/05/2011    Current Outpatient Medications on File Prior to Visit  Medication Sig Dispense Refill   acetaminophen (TYLENOL) 325 MG tablet Take 2 tablets (650 mg total) by mouth every 6 (six) hours as needed for headache.     aspirin 81 MG chewable tablet Chew 0.5 tablets (40.5 mg total) by mouth daily. 30 tablet 0   brimonidine (ALPHAGAN) 0.2 % ophthalmic solution Place 1 drop into both eyes 3 (three) times daily.      Cholecalciferol (VITAMIN D) 125 MCG (5000 UT) CAPS Take 5,000 Units by mouth daily.      fluticasone (FLONASE) 50 MCG/ACT nasal spray Place 1 spray into both nostrils at bedtime.     furosemide (LASIX) 40 MG tablet Take one tablet by mouth twice weekly (Patient taking differently: 40 mg daily. Take  one tablet by mouth daily) 30 tablet 3   latanoprost (XALATAN) 0.005 % ophthalmic solution Place 1 drop into the left eye at bedtime.  1   levothyroxine (SYNTHROID) 25 MCG tablet Take 1 tablet by mouth daily before breakfast. 90 tablet 1   loperamide (IMODIUM) 2 MG capsule Take 2 mg by mouth at bedtime.     losartan (COZAAR) 25 MG tablet Take 25 mg by mouth daily.     mirtazapine (REMERON) 7.5 MG tablet Take 7.5 mg by mouth at bedtime.       Multiple Vitamins-Minerals (PRESERVISION AREDS) CAPS Take 1 capsule by mouth 2 (two) times daily.      Omega-3 Fatty Acids (FISH OIL) 435 MG CAPS Take 435 mg by mouth daily.     omeprazole (PRILOSEC) 40 MG capsule Take 1 capsule (40 mg total) by mouth daily. (Patient taking differently: Take 80 mg by mouth daily. Patient takes twice daily) 90 capsule 1   ondansetron (ZOFRAN) 4 MG tablet Take 1 tablet (4 mg total) by mouth every 6 (six) hours as needed for nausea or vomiting. 60 tablet 1   potassium chloride SA (KLOR-CON) 20 MEQ tablet Take one tablet by mouth twice weekly (Patient taking differently: 20 mEq daily. Take one tablet by mouth daily) 30 tablet 3   predniSONE (DELTASONE) 5 MG tablet Take 10 mg by mouth daily with breakfast.     rosuvastatin (CRESTOR) 10 MG tablet Take 1 tablet (10 mg total) by mouth daily. 90 tablet 3   sertraline (ZOLOFT) 100 MG tablet Take 1 tablet by mouth daily.     traZODone (DESYREL) 50 MG tablet Take 1-2 tablets (50-100 mg total) by mouth at bedtime as needed for sleep. 180 tablet 3   No current facility-administered medications on file prior to visit.    Past Medical History:  Diagnosis Date   Anemia    Anxiety    Arthritis    Cancer ([place])    hx of skin cancer , colon    Cataract    removed both eyes    Chronic back pain    okay with sitting, increased back pain with activity    Depression    GERD (gastroesophageal reflux disease)    Glaucoma    Headache(784.0)    hx of   History of kidney stones    History of migraines    Hyperlipidemia    Hypertension    Macular degeneration    Myasthenia gravis ([place])    Occasional tremors    Pneumonia    Spinal stenosis    Thoracic aneurysm    4.4cm; see CT done 01/28/12 and 01/22/13 in [facility].    Uses hearing aid    Vitamin D deficiency     Past Surgical History:  Procedure Laterality Date   ABDOMINAL HYSTERECTOMY     CHOLECYSTECTOMY     COLON SURGERY     Right  colectomy dr. gross 03-17-18   HIP ARTHROPLASTY Left 03/11/2019   Procedure: ARTHROPLASTY BIPOLAR HIP (HEMIARTHROPLASTY);  Surgeon: Renette Butters, MD;  Location: [facility];  Service: Orthopedics;  Laterality: Left;   LUMBAR LAMINECTOMY/DECOMPRESSION MICRODISCECTOMY N/A 02/04/2013   Procedure: CENTRAL DECOMPRESSION/LUMBAR LAMINECTOMY L3-L4 AND L4-L5 2 LEVELS;  Surgeon: Tobi Bastos, MD;  Location: [facility];  Service: Orthopedics;  Laterality: N/A;   SPINAL CORD STIMULATOR IMPLANT     in right hip but not currently using because it did not help   TONSILLECTOMY     UPPER GASTROINTESTINAL ENDOSCOPY  US ECHOCARDIOGRAPHY  05/18/2007   EF 55-60%    Social History   Socioeconomic History   Marital status: Married    Spouse name: Not on file   Number of children: 3   Years of education: Not on file   Highest education level: High school graduate  Occupational History   Occupation: retired  Tobacco Use   Smoking status: Never Smoker   Smokeless tobacco: Never Used  Scientific laboratory technician Use: Never used  Substance and Sexual Activity   Alcohol use: No   Drug use: No   Sexual activity: Not Currently  Other Topics Concern   Not on file  Social History Narrative   Lives with husband in a one story home.  Has 2 sons.  Retired.  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  Education: college.  [employer].       Right handed    Social Determinants of Health   Financial Resource Strain:    Difficulty of Paying Living Expenses:   Food Insecurity:    Worried About [name] fundraiser in the Last Year:    Arboriculturist in the Last Year:   Transportation Needs:    Film/video editor (Medical):    Lack of Transportation (Non-Medical):   Physical Activity:    Days of Exercise per Week:    Minutes of Exercise per Session:   Stress:    Feeling of Stress :   Social Connections:    Frequency of Communication with Friends and Family:     Frequency of Social Gatherings with Friends and Family:    Attends Religious Services:    Active Member of Clubs or Organizations:    Attends Music therapist:    Marital Status:     Family History  Problem Relation Age of Onset   Heart attack Father    Diabetes Father    Cancer Sister    Breast cancer Sister    Cancer Brother    Lung cancer Brother    Cancer Sister    Liver cancer Sister    Diabetes Sister    Healthy Son    Colon polyps Neg Hx    Colon cancer Neg Hx    Esophageal cancer Neg Hx    Rectal cancer Neg Hx    Stomach cancer Neg Hx    Adrenal disorder Neg Hx     Review of Systems  Constitutional: Positive for appetite change (decreased) and fatigue.       Eating very little  Respiratory: Positive for shortness of breath (not worse).   Cardiovascular: Negative for chest pain, palpitations and leg swelling.  Gastrointestinal: Positive for abdominal pain (bottom), diarrhea (imodium daily - controlled) and nausea. Negative for blood in stool (no melena) and constipation.       No gerd  Neurological: Positive for light-headedness and headaches.       Objective:   Vitals:   06/30/20 1355  BP: 114/78  Pulse: 65  Temp: 98.2 F (36.8 C)  SpO2: 94%   BP Readings from Last 3 Encounters:  06/30/20 114/78  06/28/20 110/74  06/13/20 118/74   Wt Readings from Last 3 Encounters:  06/30/20 149 lb (67.6 kg)  06/28/20 149 lb (67.6 kg)  06/13/20 149 lb (67.6 kg)   Body mass index is 26.39 kg/m.   Physical Exam    Constitutional: chronically ill appearing. No distress.  Head: Normocephalic and atraumatic.  Neck: Neck supple.  No tracheal deviation present. No thyromegaly present.  No cervical lymphadenopathy Cardiovascular: Normal rate, regular rhythm and normal heart sounds.  No murmur heard. 1 + pitting foot and mild ankle edema b/l Pulmonary/Chest: Effort normal and breath sounds normal. No respiratory distress. No has no  wheezes. No rales.  Abdomen: soft, NT, ND Skin: Skin is warm and dry. Not diaphoretic.  Psychiatric: depressed mood and affect.     Lab Results  Component Value Date   WBC 8.2 06/28/2020   HGB 9.9 (L) 06/28/2020   HCT 29.5 (L) 06/28/2020   PLT 214.0 06/28/2020   GLUCOSE 147 (H) 06/28/2020   CHOL 166 02/08/2020   TRIG 108.0 02/08/2020   HDL 63.40 02/08/2020   LDLDIRECT 139.3 10/04/2013   LDLCALC 81 02/08/2020   ALT 34 05/19/2020   AST 23 05/19/2020   NA 138 06/28/2020   K 4.4 06/28/2020   CL 97 06/28/2020   CREATININE 0.75 06/28/2020   BUN 25 (H) 06/28/2020   CO2 36 (H) 06/28/2020   TSH 2.79 05/19/2020   INR 1.01 01/21/2013   HGBA1C 6.1 02/08/2020      Assessment & Plan:    See Problem List for Assessment and Plan of chronic medical problems.    This visit occurred during the SARS-CoV-2 public health emergency.  Safety protocols were in place, including screening questions prior to the visit, additional usage of staff PPE, and extensive cleaning of exam room while observing appropriate contact time as indicated for disinfecting solutions.

## 2020-06-30 ENCOUNTER — Other Ambulatory Visit: Payer: Self-pay

## 2020-06-30 ENCOUNTER — Ambulatory Visit (INDEPENDENT_AMBULATORY_CARE_PROVIDER_SITE_OTHER): Payer: Medicare Other | Admitting: Internal Medicine

## 2020-06-30 ENCOUNTER — Encounter: Payer: Self-pay | Admitting: Internal Medicine

## 2020-06-30 VITALS — BP 114/78 | HR 65 | Temp 98.2°F | Ht 63.0 in | Wt 149.0 lb

## 2020-06-30 DIAGNOSIS — I5022 Chronic systolic (congestive) heart failure: Secondary | ICD-10-CM | POA: Diagnosis not present

## 2020-06-30 DIAGNOSIS — D649 Anemia, unspecified: Secondary | ICD-10-CM

## 2020-06-30 DIAGNOSIS — I10 Essential (primary) hypertension: Secondary | ICD-10-CM

## 2020-06-30 NOTE — Patient Instructions (Addendum)
  Blood work was ordered.      Medications reviewed and updated.  Changes include :   none      

## 2020-07-01 LAB — CBC WITH DIFFERENTIAL/PLATELET
Absolute Monocytes: 1540 cells/uL — ABNORMAL HIGH (ref 200–950)
Basophils Absolute: 22 cells/uL (ref 0–200)
Basophils Relative: 0.3 %
Eosinophils Absolute: 51 cells/uL (ref 15–500)
Eosinophils Relative: 0.7 %
HCT: 30.7 % — ABNORMAL LOW (ref 35.0–45.0)
Hemoglobin: 9.6 g/dL — ABNORMAL LOW (ref 11.7–15.5)
Lymphs Abs: 489 cells/uL — ABNORMAL LOW (ref 850–3900)
MCH: 31 pg (ref 27.0–33.0)
MCHC: 31.3 g/dL — ABNORMAL LOW (ref 32.0–36.0)
MCV: 99 fL (ref 80.0–100.0)
MPV: 10.8 fL (ref 7.5–12.5)
Monocytes Relative: 21.1 %
Neutro Abs: 5198 cells/uL (ref 1500–7800)
Neutrophils Relative %: 71.2 %
Platelets: 234 10*3/uL (ref 140–400)
RBC: 3.1 10*6/uL — ABNORMAL LOW (ref 3.80–5.10)
RDW: 13.5 % (ref 11.0–15.0)
Total Lymphocyte: 6.7 %
WBC: 7.3 10*3/uL (ref 3.8–10.8)

## 2020-07-01 LAB — IRON: Iron: 33 ug/dL — ABNORMAL LOW (ref 45–160)

## 2020-07-01 LAB — FERRITIN: Ferritin: 40 ng/mL (ref 16–288)

## 2020-07-01 NOTE — Assessment & Plan Note (Signed)
Acute on chronic Anemia worse this month - concern for GI bleed Has seen GI - has EGD, CT scan ordered ? UGIB, recurrence of colon cancer Will check CBC, iron to make sure if is not drastically different Will await GI w/u next week to determine next step/treatment

## 2020-07-01 NOTE — Assessment & Plan Note (Signed)
Chronic BP well controlled Current regimen effective and well tolerated Continue current medications at current doses  

## 2020-07-01 NOTE — Assessment & Plan Note (Signed)
Chronic She has mild edema in her ankles/ feet and her BNP is high but not in comparison to her previous BNPs Lungs are clear w/o increased SOB No change in lasix now Monitor closely

## 2020-07-05 ENCOUNTER — Other Ambulatory Visit: Payer: Self-pay

## 2020-07-05 ENCOUNTER — Ambulatory Visit (HOSPITAL_COMMUNITY)
Admission: RE | Admit: 2020-07-05 | Discharge: 2020-07-05 | Disposition: A | Discharge status: Home / Self Care | Payer: Medicare Other | Source: Ambulatory Visit | Attending: Physician Assistant | Admitting: Physician Assistant

## 2020-07-05 ENCOUNTER — Ambulatory Visit: Payer: Medicare Other | Admitting: Nurse Practitioner

## 2020-07-05 DIAGNOSIS — R1030 Lower abdominal pain, unspecified: Secondary | ICD-10-CM | POA: Diagnosis not present

## 2020-07-05 DIAGNOSIS — R110 Nausea: Secondary | ICD-10-CM | POA: Diagnosis not present

## 2020-07-05 DIAGNOSIS — R1310 Dysphagia, unspecified: Secondary | ICD-10-CM | POA: Insufficient documentation

## 2020-07-05 DIAGNOSIS — R627 Adult failure to thrive: Secondary | ICD-10-CM | POA: Diagnosis not present

## 2020-07-05 DIAGNOSIS — R634 Abnormal weight loss: Secondary | ICD-10-CM | POA: Diagnosis present

## 2020-07-05 DIAGNOSIS — K224 Dyskinesia of esophagus: Secondary | ICD-10-CM | POA: Diagnosis not present

## 2020-07-07 ENCOUNTER — Encounter (HOSPITAL_COMMUNITY): Payer: Self-pay

## 2020-07-07 ENCOUNTER — Other Ambulatory Visit: Payer: Self-pay

## 2020-07-07 ENCOUNTER — Ambulatory Visit (HOSPITAL_COMMUNITY)
Admission: RE | Admit: 2020-07-07 | Discharge: 2020-07-07 | Disposition: A | Discharge status: Home / Self Care | Payer: Medicare Other | Source: Ambulatory Visit | Attending: Physician Assistant | Admitting: Physician Assistant

## 2020-07-07 DIAGNOSIS — R634 Abnormal weight loss: Secondary | ICD-10-CM | POA: Diagnosis present

## 2020-07-07 DIAGNOSIS — R627 Adult failure to thrive: Secondary | ICD-10-CM | POA: Diagnosis not present

## 2020-07-07 DIAGNOSIS — K5730 Diverticulosis of large intestine without perforation or abscess without bleeding: Secondary | ICD-10-CM | POA: Diagnosis not present

## 2020-07-07 DIAGNOSIS — R1310 Dysphagia, unspecified: Secondary | ICD-10-CM | POA: Diagnosis not present

## 2020-07-07 DIAGNOSIS — R1030 Lower abdominal pain, unspecified: Secondary | ICD-10-CM

## 2020-07-07 DIAGNOSIS — R110 Nausea: Secondary | ICD-10-CM | POA: Insufficient documentation

## 2020-07-07 MED ORDER — IOHEXOL 300 MG/ML  SOLN
100.0000 mL | Freq: Once | INTRAMUSCULAR | Status: AC | PRN
  Administered 2020-07-07: 100 mL via INTRAVENOUS

## 2020-07-07 MED ORDER — SODIUM CHLORIDE (PF) 0.9 % IJ SOLN
INTRAMUSCULAR | Status: AC
  Filled 2020-07-07: qty 50

## 2020-07-08 DIAGNOSIS — R0602 Shortness of breath: Secondary | ICD-10-CM | POA: Diagnosis not present

## 2020-07-08 DIAGNOSIS — I5032 Chronic diastolic (congestive) heart failure: Secondary | ICD-10-CM | POA: Diagnosis not present

## 2020-07-11 ENCOUNTER — Telehealth: Payer: Self-pay | Admitting: Physician Assistant

## 2020-07-11 NOTE — Telephone Encounter (Signed)
cled the spouse back. No answer. Left a message that the results are back and Amy Trellis Paganini would like to see them on Thursday 07/13/20 at 3:00pm if they can come. Asked he call and let us know and I will also speak with him if he wants to.

## 2020-07-13 ENCOUNTER — Ambulatory Visit: Payer: Medicare Other | Admitting: Physician Assistant

## 2020-07-13 ENCOUNTER — Other Ambulatory Visit (INDEPENDENT_AMBULATORY_CARE_PROVIDER_SITE_OTHER): Payer: Medicare Other

## 2020-07-13 ENCOUNTER — Encounter: Payer: Self-pay | Admitting: Physician Assistant

## 2020-07-13 ENCOUNTER — Telehealth: Payer: Self-pay | Admitting: Internal Medicine

## 2020-07-13 ENCOUNTER — Telehealth: Payer: Self-pay

## 2020-07-13 VITALS — BP 110/70 | HR 111 | Ht 62.0 in | Wt 154.0 lb

## 2020-07-13 DIAGNOSIS — R627 Adult failure to thrive: Secondary | ICD-10-CM

## 2020-07-13 DIAGNOSIS — R9389 Abnormal findings on diagnostic imaging of other specified body structures: Secondary | ICD-10-CM

## 2020-07-13 DIAGNOSIS — R630 Anorexia: Secondary | ICD-10-CM

## 2020-07-13 DIAGNOSIS — C180 Malignant neoplasm of cecum: Secondary | ICD-10-CM | POA: Diagnosis not present

## 2020-07-13 DIAGNOSIS — C44729 Squamous cell carcinoma of skin of left lower limb, including hip: Secondary | ICD-10-CM | POA: Diagnosis not present

## 2020-07-13 LAB — BASIC METABOLIC PANEL
BUN: 21 mg/dL (ref 6–23)
CO2: 36 mEq/L — ABNORMAL HIGH (ref 19–32)
Calcium: 9 mg/dL (ref 8.4–10.5)
Chloride: 92 mEq/L — ABNORMAL LOW (ref 96–112)
Creatinine, Ser: 0.88 mg/dL (ref 0.40–1.20)
GFR: 59.9 mL/min — ABNORMAL LOW (ref >60.00)
Glucose, Bld: 122 mg/dL — ABNORMAL HIGH (ref 70–99)
Potassium: 4.4 mEq/L (ref 3.5–5.1)
Sodium: 136 mEq/L (ref 135–145)

## 2020-07-13 MED ORDER — ONDANSETRON HCL 4 MG PO TABS: ORAL_TABLET | ORAL | 1 refills | Status: AC

## 2020-07-13 NOTE — Progress Notes (Signed)
Subjective:    Patient ID: Tamara Gregory, female    DOB: Nov 11, 1925, 86 y.o.   MRN: [id number]  HPI Tamara Gregory is a pleasant 86 year old white female, established with Dr. Ardis Hughs.  She was seen by myself on 06/28/2020 and she and her husband come in today for follow-up.  She has had a general decline over the past couple of months, and has had difficulty eating.  She has absolutely no appetite.  She really has not had any true dysphagia but rather a sensation that "food gets bigger in my mouth"..  She has been bothered by frequent nausea but not vomiting. She has multiple comorbidities including myasthenia gravis for which she is on chronic low-dose steroids, previous hip fracture, coronary artery disease, congestive heart failure with recent exacerbation and hypertension. She had a cecal moderately differentiated adenocarcinoma diagnosed in 2019 as well as multiple adenomatous colon polyps.  She underwent right hemicolectomy was found to have a T3 N0 M0 lesion. Prior to being seen in our office she had been having problems with volume overload over the previous few weeks. She is admitted to being depressed and somewhat despondent "" over her life. She was also having lower abdominal pain which she said had been present over the past few months and fairly chronic.  Bowel movements had been pretty regular. She was scheduled for barium swallow which shows a moderately large hiatal hernia and evidence of esophageal dysmotility, barium tablet did not easily pass the GE junction but no definite stricture noted. She also had CT of the abdomen and pelvis done on 07/08/2020 with finding of numerous new solid pulmonary nodules in both lungs largest measuring 2.6 cm is noted to have small bilateral effusions.  The pulmonary nodules are read as consistent with metastatic disease. Most recent labs 06/30/2020 WBC 7.3, hemoglobin 9.6, iron 33, ferritin 40. Patient has not had any significant change since she was last  seen.  Her appetite remains quite poor and her husband says she is not eating much at all.  She has become weaker and is having difficulty standing etc.  He is wondering if she needs to get into physical therapy. She has been a bit more constipated and has not had a bowel movement for the past couple of days since undergoing the CT scan. He is also concerned because she has regained some edema in her lower extremities. Her weight is up today from 149 last visit to 154 today.  Review of Systems Pertinent positive and negative review of systems were noted in the above HPI section.  All other review of systems was otherwise negative.  Outpatient Encounter Medications as of 07/13/2020  Medication Sig  . acetaminophen (TYLENOL) 325 MG tablet Take 2 tablets (650 mg total) by mouth every 6 (six) hours as needed for headache.  [name] aspirin 81 MG chewable tablet Chew 0.5 tablets (40.5 mg total) by mouth daily.  . brimonidine (ALPHAGAN) 0.2 % ophthalmic solution Place 1 drop into both eyes 3 (three) times daily.   . Cholecalciferol (VITAMIN D) 125 MCG (5000 UT) CAPS Take 5,000 Units by mouth daily.   . fluticasone (FLONASE) 50 MCG/ACT nasal spray Place 1 spray into both nostrils at bedtime.  . furosemide (LASIX) 40 MG tablet Take 40 mg by mouth daily.  [name] latanoprost (XALATAN) 0.005 % ophthalmic solution Place 1 drop into the left eye at bedtime.  [name] levothyroxine (SYNTHROID) 25 MCG tablet Take 1 tablet by mouth daily before breakfast.  . loperamide (IMODIUM) 2  MG capsule Take 2 mg by mouth at bedtime.  [name] losartan (COZAAR) 25 MG tablet Take 25 mg by mouth daily.  . mirtazapine (REMERON) 7.5 MG tablet Take 7.5 mg by mouth at bedtime.  . Multiple Vitamins-Minerals (PRESERVISION AREDS) CAPS Take 1 capsule by mouth 2 (two) times daily.   . Omega-3 Fatty Acids (FISH OIL) 435 MG CAPS Take 435 mg by mouth daily.  [name] omeprazole (PRILOSEC) 40 MG capsule Take 80 mg by mouth daily.  . ondansetron (ZOFRAN) 4 MG tablet  Take 1 tablet every morning, may take second dose daily if needed  . potassium chloride SA (KLOR-CON) 20 MEQ tablet Take 20 mEq by mouth daily.  . predniSONE (DELTASONE) 5 MG tablet Take 10 mg by mouth daily with breakfast.  . rosuvastatin (CRESTOR) 10 MG tablet Take 1 tablet (10 mg total) by mouth daily.  . sertraline (ZOLOFT) 100 MG tablet Take 1 tablet by mouth daily.  . traZODone (DESYREL) 50 MG tablet Take 1-2 tablets (50-100 mg total) by mouth at bedtime as needed for sleep.  . [DISCONTINUED] ondansetron (ZOFRAN) 4 MG tablet Take 1 tablet (4 mg total) by mouth every 6 (six) hours as needed for nausea or vomiting.  . [DISCONTINUED] furosemide (LASIX) 40 MG tablet Take one tablet by mouth twice weekly (Patient taking differently: 40 mg daily. Take one tablet by mouth daily)  . [DISCONTINUED] omeprazole (PRILOSEC) 40 MG capsule Take 1 capsule (40 mg total) by mouth daily. (Patient taking differently: Take 80 mg by mouth daily. Patient takes twice daily)  . [DISCONTINUED] potassium chloride SA (KLOR-CON) 20 MEQ tablet Take one tablet by mouth twice weekly (Patient taking differently: 20 mEq daily. Take one tablet by mouth daily)   No facility-administered encounter medications on file as of 07/13/2020.   Allergies  Allergen Reactions  . Tramadol Other (See Comments)    delirium   Patient Active Problem List   Diagnosis Date Noted  . Rib pain on left side 01/24/2020  . Acute pain of left shoulder 01/24/2020  . Abdominal pain 12/08/2019  . Decreased mobility and endurance 11/15/2019  . Anemia 08/29/2019  . CAD (coronary artery disease) 08/17/2019  . invasive SCC (squamous cell carcinoma), leg, left 07/31/2019  . Hypothyroidism 07/01/2019  . Closed fracture of left hip ([place]) 06/30/2019  . Hair loss 06/29/2019  . Chronic systolic heart failure ([employer]) 05/14/2019  . Sleep disturbance 04/29/2019  . Tachycardia   . Steroid-induced hyperglycemia   . Leukocytosis   . E. coli UTI   .  Hypoalbuminemia due to protein-calorie malnutrition ([place])   . Ocular myasthenia gravis ([place])   . Hip fracture ([place]) 03/10/2019  . Left displaced femoral neck fracture ([place]) 03/10/2019  . Weakness 12/04/2018  . 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 Right ovarian cyst s/p RSO 03/17/2018  . Diarrhea 12/23/2017  . Large hiatal hernia 04/01/2017  . Sweating profusely 03/19/2017  . Hyperglycemia 03/19/2017  . Squamous cell skin cancer 11/05/2016  . Nosebleed 10/19/2016  . Osteoporosis 08/28/2016  . Essential hypertension, benign 08/21/2016  . Long term current use of systemic steroids 08/21/2016  . Primary open angle glaucoma of both eyes, mild stage 05/14/2016  . Ocular hypertension of left eye 05/08/2016  . GERD (gastroesophageal reflux disease) 05/06/2016  . Macular pucker, bilateral 11/14/2015  . Status post intraocular lens implant 04/04/2015  . Exudative age-related macular degeneration ([place]) 04/04/2015  . Chronic pain syndrome 02/07/2015  . Bilateral nonexudative age-related macular degeneration 01/16/2015  .  Urge incontinence of urine 01/17/2014  . Urinary urgency 01/17/2014  . Malaise and fatigue 10/04/2013  . PAC (premature atrial contraction) 10/04/2013  . Depression 02/15/2013  . Myasthenia gravis ([place]) 03/17/2012  . Spinal stenosis, lumbar 01/14/2012  . Ptosis of eyelid 01/14/2012  . Pure hypercholesterolemia 09/05/2011  . Benign hypertensive heart disease without heart failure 09/05/2011  . Osteoarthritis 09/05/2011   Social History   Socioeconomic History  . Marital status: Married    Spouse name: Ray  . Number of children: 2  . Years of education: Not on file  . Highest education level: High school graduate  Occupational History  . Occupation: retired  Tobacco Use  . Smoking status: Never Smoker  . Smokeless tobacco: Never Used  Vaping Use  . Vaping Use: Never used  Substance and Sexual Activity  . Alcohol use: No  . Drug  use: No  . Sexual activity: Not Currently  Other Topics Concern  . Not on file  Social History Narrative   Lives with husband in a one story home.  Has 2 sons.  Retired.  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  Education: college.  [employer].       Right handed    Social Determinants of Health   Financial Resource Strain:   . Difficulty of Paying Living Expenses:   Food Insecurity:   . Worried About [name] fundraiser in the Last Year:   . Arboriculturist in the Last Year:   Transportation Needs:   . Film/video editor (Medical):   [name] Lack of Transportation (Non-Medical):   Physical Activity:   . Days of Exercise per Week:   . Minutes of Exercise per Session:   Stress:   . Feeling of Stress :   Social Connections:   . Frequency of Communication with Friends and Family:   . Frequency of Social Gatherings with Friends and Family:   . Attends Religious Services:   . Active Member of Clubs or Organizations:   . Attends Archivist Meetings:   [name] Marital Status:   Intimate Partner Violence:   . Fear of Current or Ex-Partner:   . Emotionally Abused:   [name] Physically Abused:   . Sexually Abused:     Tamara Gregory's family history includes Breast cancer in her sister; Cancer in her brother, sister, and sister; Diabetes in her father and sister; Healthy in her son; Heart attack in her father; Liver cancer in her sister; Lung cancer in her brother.      Objective:    Vitals:   07/13/20 1509  BP: 110/70  Pulse: (!) 111    Physical Exam.Well-developed frail-appearing very elderly white female in no acute distress.  She is in a wheelchair and accompanied by her husband who offers most of the history.[name]  Height, Weight 154, BMI 28.1  HEENT; nontraumatic normocephalic, EOMI, PER R LA, sclera anicteric. Oropharynx; not examined today Neck; supple, no JVD Cardiovascular; regular rate and rhythm with S1-S2, no murmur rub or gallop Pulmonary; Clear  bilaterally, few basilar crackles Abdomen; soft, nontender, nondistended, no palpable mass or hepatosplenomegaly, bowel sounds are active Rectal; not done Skin; thin skin with multiple ecchymoses Extremities; no clubbing cyanosis 2+ edema bilateral lower extremities left greater than right into the shin Neuro/Psych; alert and oriented x4, grossly nonfocal mood and affect appropriate       Assessment & Plan:   #[id number] 86 year old white female with history of moderately differentiated adenocarcinoma  of the cecum diagnosed April 2019, status post right hemicolectomy with stage IIa lesion. Patient has had a general decline over the past couple of months with progressive weakness, lack of appetite, anorexia, complaints of lower abdominal discomfort, intermittent nausea, and fatigue. She also had a recent exacerbation of congestive heart failure managed as an outpatient. Recent barium swallow just done for vague complaints of dysphagia to solids showed no definite stricture, she has moderately large hiatal hernia and evidence of esophageal dysmotility, no stricture. Unfortunately CT of the abdomen and pelvis shows numerous new solid pulmonary nodules in both lungs very suspicious for pulmonary metastases. , There is no evidence of metastatic disease in the abdomen, no lymphadenopathy or hepatic abnormality noted.  Unclear whether the pulmonary mets could be related to metastatic colon cancer versus other. #2 myasthenia gravis on low-dose steroids 3.  Depression 4.  Coronary artery disease 5.  Congestive heart failure as above  Plan; discussed CT findings with the patient and her husband today.  We will obtain follow-up appointment with Dr. Burr Medico who had followed her after the colon cancer diagnosis.  We will try to get her in ASAP. CEA and CA 19-9 today, be met. Again advised trial of Zofran on a more regular basis.  Start Zofran 4 mg every morning then may repeat dosing in 6 to 8 hours if  needed. Continue Ensure boost supplements 2-3 times daily. Patient's husband will give her 1 extra dose of Lasix this evening then advised to get in touch with Dr. Quay Burow for further advice regarding diuretics.  Sabino Denning Genia Harold PA-C 07/13/2020   Cc: Binnie Rail, MD

## 2020-07-13 NOTE — Telephone Encounter (Signed)
Contacted by [facility] that ct scan shows lung metastasis.  Reviewed with Dr. Burr Medico.  Per Dr Burr Medico patient to be seen week of 07/24/2020.  Scheduling message sent.

## 2020-07-13 NOTE — Progress Notes (Signed)
amb  

## 2020-07-13 NOTE — Patient Instructions (Addendum)
If you are age 84 or older, your body mass index should be between 23-30. Your Body mass index is 28.17 kg/m. If this is out of the aforementioned range listed, please consider follow up with your Primary Care Provider.  If you are age 31 or younger, your body mass index should be between 19-25. Your Body mass index is 28.17 kg/m. If this is out of the aformentioned range listed, please consider follow up with your Primary Care Provider.   Your provider has requested that you go to the basement level for lab work before leaving today. Press "B" on the elevator. The lab is located at the first door on the left as you exit the elevator.  Take Zofran 4 mg every morning, take an extra dose if needed  Take 1 extra dose of Lasix/Furosemide today  Call Dr. Quay Burow regarding other changes to Lasix.  We have contacted Dr. Teola Bradley office. They have sent a message to him to review your CT. They will call you to schedule an appointment. If you do not hear from them with in 2 weeks please call their office.  Follow up with Nicoletta Ba or Dr. Ardis Hughs as needed.

## 2020-07-13 NOTE — Telephone Encounter (Signed)
   Spouse calling to report patient has swollen ankles. The lasix was increased today by GI, Amy Esterwood. Spouse is looking for additional instructions if patients ankles continue to be swollen

## 2020-07-14 ENCOUNTER — Telehealth: Payer: Self-pay | Admitting: Hematology

## 2020-07-14 LAB — CEA: CEA: 7.5 ng/mL — ABNORMAL HIGH

## 2020-07-14 LAB — CANCER ANTIGEN 19-9: CA 19-9: 4 U/mL (ref <34)

## 2020-07-14 NOTE — Telephone Encounter (Signed)
Message left for patient today with recommendations.

## 2020-07-14 NOTE — Telephone Encounter (Signed)
Scheduled appointments per 8/12 scheduling message. Left message for patient with appointments date and time.

## 2020-07-14 NOTE — Telephone Encounter (Signed)
It is okay to take an extra dose of Lasix for the next 3 days- this should help with the fluid; unfortunately some of the swelling is probably related to the cancer. If they have continued concerns or questions, please schedule a follow up with Dr. Quay Burow.

## 2020-07-17 ENCOUNTER — Ambulatory Visit: Payer: Medicare Other | Admitting: Neurology

## 2020-07-17 ENCOUNTER — Other Ambulatory Visit: Payer: Self-pay

## 2020-07-17 ENCOUNTER — Encounter: Payer: Self-pay | Admitting: Neurology

## 2020-07-17 ENCOUNTER — Telehealth: Payer: Self-pay | Admitting: Nurse Practitioner

## 2020-07-17 ENCOUNTER — Telehealth: Payer: Self-pay | Admitting: Cardiovascular Disease

## 2020-07-17 VITALS — BP 130/72 | HR 72 | Resp 18 | Ht 62.0 in | Wt 167.0 lb

## 2020-07-17 DIAGNOSIS — G7000 Myasthenia gravis without (acute) exacerbation: Secondary | ICD-10-CM

## 2020-07-17 MED ORDER — TORSEMIDE 20 MG PO TABS
20.0000 mg | ORAL_TABLET | Freq: Every day | ORAL | 11 refills | Status: DC
Start: 2020-07-17 — End: 2020-07-25

## 2020-07-17 NOTE — Telephone Encounter (Signed)
Pt c/o swelling: STAT is pt has developed SOB within 24 hours  1) How much weight have you gained and in what time span? States she has gained about 6 lbs when she last saw PCP  2) If swelling, where is the swelling located? legs  3) Are you currently taking a fluid pill? yes  4) Are you currently SOB? no  5) Do you have a log of your daily weights (if so, list)? no  6) Have you gained 3 pounds in a day or 5 pounds in a week? no  7) Have you traveled recently? no   Patient's husband states the patient is not doing well and has been having swelling in her legs. He states she also has blood blisters. He states the last time she saw her PCP she had gained 6 lbs, but is unsure how much she has gained since.

## 2020-07-17 NOTE — Telephone Encounter (Signed)
Continue leg elevation. She may try torsemide 20 mg a day instead of the furosemide .  That might result in more diuresis compared to furosemide

## 2020-07-17 NOTE — Patient Instructions (Signed)
Continue prednisone 10mg  daily  Return to clinic in 4 months

## 2020-07-17 NOTE — Telephone Encounter (Signed)
Spoke with the patient's husband. The patient will continue to elevate her legs. She will start taking torsemide 20 mg daily and discontinue furosemide. He will let us know if there is no change.

## 2020-07-17 NOTE — Telephone Encounter (Signed)
R/s 8/24 appt per patient request from 8/13 sch msg. Called and spoke with husband. Confirmed new appt

## 2020-07-17 NOTE — Progress Notes (Signed)
Follow-up Visit   Date: 07/17/20   Tamara Gregory MRN: [id number] DOB: 05/12/1925   Interim History: Tamara Gregory is a delightful 86 y.o. Caucasian female returning to the clinic for follow-up of ocular myasthenia gravis (diagnosted 2013 at [facility]).  The patient was accompanied to the clinic by husband, retired Pharmacist, community, who also provides collateral information.    She has been on prednisone 10mg  daily and reports that her eyes are less droopy and double vision is not has severe.   It still persists and she is scheduled to get prisms. She no longer wears a eye patch.  She has failure to thrive, no appetite, and some difficulty with swallowing.  She has become more sedentary and is mostly in a wheelchair.  Her husband is primary caregiver and assist with ADLs and transfers. She has not been doing very well from a general health standpoint due to worsening heart failure (no on oxygen).      Medications:  Current Outpatient Medications on File Prior to Visit  Medication Sig Dispense Refill  . acetaminophen (TYLENOL) 325 MG tablet Take 2 tablets (650 mg total) by mouth every 6 (six) hours as needed for headache.    [name] aspirin 81 MG chewable tablet Chew 0.5 tablets (40.5 mg total) by mouth daily. 30 tablet 0  . brimonidine (ALPHAGAN) 0.2 % ophthalmic solution Place 1 drop into both eyes 3 (three) times daily.     . Cholecalciferol (VITAMIN D) 125 MCG (5000 UT) CAPS Take 5,000 Units by mouth daily.     . fluticasone (FLONASE) 50 MCG/ACT nasal spray Place 1 spray into both nostrils at bedtime.    [name] latanoprost (XALATAN) 0.005 % ophthalmic solution Place 1 drop into the left eye at bedtime.  1  . levothyroxine (SYNTHROID) 25 MCG tablet Take 1 tablet by mouth daily before breakfast. 90 tablet 1  . loperamide (IMODIUM) 2 MG capsule Take 2 mg by mouth at bedtime.    [name] losartan (COZAAR) 25 MG tablet Take 25 mg by mouth daily.    . mirtazapine (REMERON) 7.5 MG tablet Take 7.5 mg by mouth at  bedtime.    . Multiple Vitamins-Minerals (PRESERVISION AREDS) CAPS Take 1 capsule by mouth 2 (two) times daily.     . Omega-3 Fatty Acids (FISH OIL) 435 MG CAPS Take 435 mg by mouth daily.    [name] omeprazole (PRILOSEC) 40 MG capsule Take 80 mg by mouth daily.    . ondansetron (ZOFRAN) 4 MG tablet Take 1 tablet every morning, may take second dose daily if needed 60 tablet 1  . potassium chloride SA (KLOR-CON) 20 MEQ tablet Take 20 mEq by mouth daily.    . predniSONE (DELTASONE) 5 MG tablet Take 10 mg by mouth daily with breakfast.    . rosuvastatin (CRESTOR) 10 MG tablet Take 1 tablet (10 mg total) by mouth daily. 90 tablet 3  . sertraline (ZOLOFT) 100 MG tablet Take 1 tablet by mouth daily.    . traZODone (DESYREL) 50 MG tablet Take 1-2 tablets (50-100 mg total) by mouth at bedtime as needed for sleep. 180 tablet 3  . furosemide (LASIX) 40 MG tablet Take 40 mg by mouth daily. (Patient not taking: Reported on 07/17/2020)     No current facility-administered medications on file prior to visit.    Allergies:  Allergies  Allergen Reactions  . Tramadol Other (See Comments)    delirium    Vital Signs:  BP 130/72   Pulse 72  Resp 18   Ht 5\' 2"  (1.575 m)   Wt 167 lb (75.8 kg)   SpO2 95%   BMI 30.54 kg/m   Neurological Exam: MENTAL STATUS including orientation to time, place, person, recent and remote memory, attention span and concentration, language, and fund of knowledge is normal.  Speech is not dysarthric.  CRANIAL NERVES:    Mild weakness with left eye lateral gaze, otherwise normal conjugate, extra-ocular eye movements in all directions of gaze.  Mild right ptosis (baseline, no worsening with sustained upgaze).  Face is symmetric.   MOTOR:  Antigravity in arms and legs.  Marked edema in the legs L >R with large hematoma over the lower right leg  COORDINATION/GAIT:   Gait not tested  Data: Labs 01/2012:+ ACh R antibody at .81 (low range of + >.3  IMPRESSION/PLAN: Seropositive  ocular myasthenia gravis with exacerbation, diagnosed 2013.  I discussed potentially reducing the dose of prednisone to 7.5mg  daily, but given that this is the first time in a long time where her eyes are not bothering her as much, I have decided to keep her on 10mg /d.  If she remains stable over the cooler months, I will plan to reduce to 7.5mg  at her next visit.  She did not appreciate any benefit with mestinon, so it has been stopped.   She will follow-up with her PCP for skin changes.    Thank you for allowing me to participate in patient's care.  If I can answer any additional questions, I would be pleased to do so.    Sincerely,    Rabiah Goeser K. Posey Pronto, DO

## 2020-07-17 NOTE — Telephone Encounter (Signed)
Spoke with the patient's husband who states that his wife has been having swelling in her legs for the past week. Denies any increased SOB. Her PCP increased her Lasix for 4 days. She is now back on regular dose of 40 mg daily. He states that the increased dose did not help that much. He is unsure if she has gained weight over the past week. Denies any other symptoms. Advised to elevate legs.

## 2020-07-18 ENCOUNTER — Encounter: Payer: Self-pay | Admitting: Family

## 2020-07-18 ENCOUNTER — Telehealth: Payer: Self-pay

## 2020-07-18 ENCOUNTER — Ambulatory Visit (INDEPENDENT_AMBULATORY_CARE_PROVIDER_SITE_OTHER): Payer: Medicare Other | Admitting: Family

## 2020-07-18 VITALS — BP 124/84 | HR 81 | Temp 97.7°F | Ht 62.0 in | Wt 153.0 lb

## 2020-07-18 DIAGNOSIS — R600 Localized edema: Secondary | ICD-10-CM

## 2020-07-18 DIAGNOSIS — T148XXA Other injury of unspecified body region, initial encounter: Secondary | ICD-10-CM | POA: Diagnosis not present

## 2020-07-18 DIAGNOSIS — I872 Venous insufficiency (chronic) (peripheral): Secondary | ICD-10-CM

## 2020-07-18 MED ORDER — CEPHALEXIN 500 MG PO CAPS
500.0000 mg | ORAL_CAPSULE | Freq: Three times a day (TID) | ORAL | 0 refills | Status: AC
Start: 2020-07-18 — End: ?

## 2020-07-18 NOTE — Progress Notes (Signed)
Tamara Gregory is a 86 y.o. female with the following history as recorded in EpicCare:  Patient Active Problem List   Diagnosis Date Noted   Rib pain on left side 01/24/2020   Acute pain of left shoulder 01/24/2020   Abdominal pain 12/08/2019   Decreased mobility and endurance 11/15/2019   Anemia 08/29/2019   CAD (coronary artery disease) 08/17/2019   invasive SCC (squamous cell carcinoma), leg, left 07/31/2019   Hypothyroidism 07/01/2019   Closed fracture of left hip ([place]) 06/30/2019   Hair loss 16/09/9603   Chronic systolic heart failure ([place]) 05/14/2019   Sleep disturbance 04/29/2019   Tachycardia    Steroid-induced hyperglycemia    Leukocytosis    E. coli UTI    Hypoalbuminemia due to protein-calorie malnutrition ([place])    Ocular myasthenia gravis ([place])    Hip fracture ([place]) 03/10/2019   Left displaced femoral neck fracture ([place]) 03/10/2019   Weakness 12/04/2018   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 03/17/2018   Right ovarian cyst s/p RSO 03/17/2018   Diarrhea 12/23/2017   Large hiatal hernia 04/01/2017   Sweating profusely 03/19/2017   Hyperglycemia 03/19/2017   Squamous cell skin cancer 11/05/2016   Nosebleed 10/19/2016   Osteoporosis 08/28/2016   Essential hypertension, benign 08/21/2016   Long term current use of systemic steroids 08/21/2016   Primary open angle glaucoma of both eyes, mild stage 05/14/2016   Ocular hypertension of left eye 05/08/2016   GERD (gastroesophageal reflux disease) 05/06/2016   Macular pucker, bilateral 11/14/2015   Status post intraocular lens implant 04/04/2015   Exudative age-related macular degeneration ([place]) 04/04/2015   Chronic pain syndrome 02/07/2015   Bilateral nonexudative age-related macular degeneration 01/16/2015   Urge incontinence of urine 01/17/2014   Urinary urgency 01/17/2014   Malaise and fatigue 10/04/2013   PAC (premature atrial contraction) 10/04/2013    Depression 02/15/2013   Myasthenia gravis ([place]) 03/17/2012   Spinal stenosis, lumbar 01/14/2012   Ptosis of eyelid 01/14/2012   Pure hypercholesterolemia 09/05/2011   Benign hypertensive heart disease without heart failure 09/05/2011   Osteoarthritis 09/05/2011    Current Outpatient Medications  Medication Sig Dispense Refill   acetaminophen (TYLENOL) 325 MG tablet Take 2 tablets (650 mg total) by mouth every 6 (six) hours as needed for headache.     aspirin 81 MG chewable tablet Chew 0.5 tablets (40.5 mg total) by mouth daily. 30 tablet 0   brimonidine (ALPHAGAN) 0.2 % ophthalmic solution Place 1 drop into both eyes 3 (three) times daily.      Cholecalciferol (VITAMIN D) 125 MCG (5000 UT) CAPS Take 5,000 Units by mouth daily.      fluticasone (FLONASE) 50 MCG/ACT nasal spray Place 1 spray into both nostrils at bedtime.     furosemide (LASIX) 40 MG tablet Take 40 mg by mouth daily.      latanoprost (XALATAN) 0.005 % ophthalmic solution Place 1 drop into the left eye at bedtime.  1   levothyroxine (SYNTHROID) 25 MCG tablet Take 1 tablet by mouth daily before breakfast. 90 tablet 1   loperamide (IMODIUM) 2 MG capsule Take 2 mg by mouth at bedtime.     losartan (COZAAR) 25 MG tablet Take 25 mg by mouth daily.     mirtazapine (REMERON) 7.5 MG tablet Take 7.5 mg by mouth at bedtime.     Multiple Vitamins-Minerals (PRESERVISION AREDS) CAPS Take 1 capsule by mouth 2 (two) times daily.      Omega-3 Fatty Acids (FISH OIL) 435 MG  CAPS Take 435 mg by mouth daily.     omeprazole (PRILOSEC) 40 MG capsule Take 80 mg by mouth daily.     ondansetron (ZOFRAN) 4 MG tablet Take 1 tablet every morning, may take second dose daily if needed 60 tablet 1   potassium chloride SA (KLOR-CON) 20 MEQ tablet Take 20 mEq by mouth daily.     predniSONE (DELTASONE) 5 MG tablet Take 10 mg by mouth daily with breakfast.     rosuvastatin (CRESTOR) 10 MG tablet Take 1 tablet (10 mg total) by mouth  daily. 90 tablet 3   sertraline (ZOLOFT) 100 MG tablet Take 1 tablet by mouth daily.     torsemide (DEMADEX) 20 MG tablet Take 1 tablet (20 mg total) by mouth daily. 30 tablet 11   traZODone (DESYREL) 50 MG tablet Take 1-2 tablets (50-100 mg total) by mouth at bedtime as needed for sleep. 180 tablet 3   cephALEXin (KEFLEX) 500 MG capsule Take 1 capsule (500 mg total) by mouth 3 (three) times daily. 15 capsule 0   No current facility-administered medications for this visit.    Allergies: Tramadol  Past Medical History:  Diagnosis Date   Anemia    Anxiety    Arthritis    Cancer ([place])    hx of skin cancer , colon    Cataract    removed both eyes    Chronic back pain    okay with sitting, increased back pain with activity    Depression    GERD (gastroesophageal reflux disease)    Glaucoma    Headache(784.0)    hx of   History of kidney stones    History of migraines    Hyperlipidemia    Hypertension    Macular degeneration    Myasthenia gravis ([place])    Occasional tremors    Pneumonia    Spinal stenosis    Thoracic aneurysm    4.4cm; see CT done 01/28/12 and 01/22/13 in EPIC.    Uses hearing aid    Vitamin D deficiency     Past Surgical History:  Procedure Laterality Date   ABDOMINAL HYSTERECTOMY     CHOLECYSTECTOMY     COLON SURGERY     Right colectomy dr. gross 03-17-18   HIP ARTHROPLASTY Left 03/11/2019   Procedure: ARTHROPLASTY BIPOLAR HIP (HEMIARTHROPLASTY);  Surgeon: Renette Butters, MD;  Location: [facility];  Service: Orthopedics;  Laterality: Left;   LUMBAR LAMINECTOMY/DECOMPRESSION MICRODISCECTOMY N/A 02/04/2013   Procedure: CENTRAL DECOMPRESSION/LUMBAR LAMINECTOMY L3-L4 AND L4-L5 2 LEVELS;  Surgeon: Tobi Bastos, MD;  Location: [facility];  Service: Orthopedics;  Laterality: N/A;   SPINAL CORD STIMULATOR IMPLANT     in right hip but not currently using because it did not help   TONSILLECTOMY     UPPER GASTROINTESTINAL ENDOSCOPY      US ECHOCARDIOGRAPHY  05/18/2007   EF 55-60%    Family History  Problem Relation Age of Onset   Heart attack Father    Diabetes Father    Cancer Sister    Breast cancer Sister    Cancer Brother    Lung cancer Brother    Cancer Sister    Liver cancer Sister    Diabetes Sister    Healthy Son    Colon polyps Neg Hx    Colon cancer Neg Hx    Esophageal cancer Neg Hx    Rectal cancer Neg Hx    Stomach cancer Neg Hx    Adrenal disorder Neg Hx  Social History   Tobacco Use   Smoking status: Never Smoker   Smokeless tobacco: Never Used  Substance Use Topics   Alcohol use: No    Subjective:  Patient is accompanied by husband; he provides 20 of history for patient; had been having increased problems with swelling in lower extremities- called cardiology yesterday and they changed patient from Lasix to Torsemide; husband and patient both in agreement that this has improved; he is concerned about "redness" on both lower legs and "lump" noted at right ankle;   Objective:  Vitals:   07/18/20 1208  BP: 124/84  Pulse: 81  Temp: 97.7 F (36.5 C)  TempSrc: Oral  SpO2: 95%  Weight: 153 lb (69.4 kg)  Height: 5\' 2"  (1.575 m)    General: Well developed, well nourished, in no acute distress  Skin : Warm and dry. Movable hematoma noted over right ankle;  Head: Normocephalic and atraumatic  Lungs: Respirations unlabored; on oxygen Extremities: bilateral pedal edema, cyanosis, clubbing  Neurologic: Alert and oriented; speech intact; face symmetrical; in wheelchair;  Assessment:  1. Pedal edema   2. Chronic venous stasis dermatitis   3. Hematoma     Plan:  1. Improving with change to Torsemide; follow up with cardiology as scheduled; 2. Will treat with Keflex tid x 5 days; explained this is a chronic issue and unfortunately, it cannot be cleared completely; 3. Reassurance- apply moist compresses; no surgery is needed;  This visit occurred during the  SARS-CoV-2 public health emergency.  Safety protocols were in place, including screening questions prior to the visit, additional usage of staff PPE, and extensive cleaning of exam room while observing appropriate contact time as indicated for disinfecting solutions.     No follow-ups on file.  No orders of the defined types were placed in this encounter.   Requested Prescriptions   Signed Prescriptions Disp Refills   cephALEXin (KEFLEX) 500 MG capsule 15 capsule 0    Sig: Take 1 capsule (500 mg total) by mouth 3 (three) times daily.

## 2020-07-18 NOTE — Telephone Encounter (Deleted)
Error

## 2020-07-18 NOTE — Patient Instructions (Signed)
Hematoma A hematoma is a collection of blood. A hematoma can happen:  Under the skin.  In an organ.  In a body space.  In a joint space.  In other tissues. The blood can thicken (clot) to form a lump that you can see and feel. The lump is often hard and may become sore and tender. The lump can be very small or very big. Most hematomas get better in a few days to weeks. However, some hematomas may be serious and need medical care. What are the causes? This condition is caused by:  An injury.  Blood that leaks under the skin.  Problems from surgeries.  Medical conditions that cause bleeding or bruising. What increases the risk? You are more likely to develop this condition if:  You are an older adult.  You use medicines that thin your blood. What are the signs or symptoms? Symptoms depend on where the hematoma is in your body.  If the hematoma is under the skin, there is: ? A firm lump on the body. ? Pain and tenderness in the area. ? Bruising. The skin above the lump may be blue, dark blue, purple-red, or yellowish.  If the hematoma is deep in the tissues or body spaces, there may be: ? Blood in the stomach. This may cause pain in the belly (abdomen), weakness, passing out (fainting), and shortness of breath. ? Blood in the head. This may cause a headache, weakness, trouble speaking or understanding speech, or passing out. How is this diagnosed? This condition is diagnosed based on:  Your medical history.  A physical exam.  Imaging tests, such as ultrasound or CT scan.  Blood tests. How is this treated? Treatment depends on the cause, size, and location of the hematoma. Treatment may include:  Doing nothing. Many hematomas go away on their own without treatment.  Surgery or close monitoring. This may be needed for large hematomas or hematomas that affect the body's organs.  Medicines. These may be given if a medical condition caused the hematoma. Follow these  instructions at home: Managing pain, stiffness, and swelling   If told, put ice on the area. ? Put ice in a plastic bag. ? Place a towel between your skin and the bag. ? Leave the ice on for 20 minutes, 2-3 times a day for the first two days.  If told, put heat on the affected area after putting ice on the area for two days. Use the heat source that your doctor tells you to use. This could be a moist heat pack or a heating pad. To do this: ? Place a towel between your skin and the heat source. ? Leave the heat on for 20-30 minutes. ? Remove the heat if your skin turns bright red. This is very important if you are unable to feel pain, heat, or cold. You may have a greater risk of getting burned.  Raise (elevate) the affected area above the level of your heart while you are sitting or lying down.  Wrap the affected area with an elastic bandage, if told by your doctor. Do not wrap the bandage too tightly.  If your hematoma is on a leg or foot and is painful, your doctor may give you crutches. Use them as told by your doctor. General instructions  Take over-the-counter and prescription medicines only as told by your doctor.  Keep all follow-up visits as told by your doctor. This is important. Contact a doctor if:  You have a fever.    The swelling or bruising gets worse.  You start to get more hematomas. Get help right away if:  Your pain gets worse.  Your pain is not getting better with medicine.  Your skin over the hematoma breaks or starts to bleed.  Your hematoma is in your chest or belly and you: ? Pass out. ? Feel weak. ? Become short of breath.  You have a hematoma on your scalp that is caused by a fall or injury, and you: ? Have a headache that gets worse. ? Have trouble speaking or understanding speech. ? Become less alert or you pass out. Summary  A hematoma is a collection of blood in any part of your body.  Most hematomas get better on their own in a few days  to weeks. Some may need medical care.  Follow instructions from your doctor about how to care for your hematoma.  Contact a doctor if the swelling or bruising gets worse, or if you are short of breath. This information is not intended to replace advice given to you by your health care provider. Make sure you discuss any questions you have with your health care provider. Document Revised: 04/23/2018 Document Reviewed: 04/23/2018 Elsevier Patient Education  2020 [employer] Inc[employer]  

## 2020-07-20 ENCOUNTER — Telehealth: Payer: Self-pay | Admitting: Cardiovascular Disease

## 2020-07-20 NOTE — Telephone Encounter (Signed)
Spoke with patient's husband who states patient continues to have bilateral ankle swelling. She is being treated by PCP for leg infection and is on antibiotic. He asks if he can give her an additional dose of torsemide and if so how often he can increase the dose. I advised that he may give her torsemide 40 mg tomorrow and Saturday and call back Monday to let us know if the additional dose helps her leg swelling. Her GFR and creatinine are stable from lab work on 8/12.  I advised that I will forward message to Dr. Acie Fredrickson for agreement and additional dosing instructions.

## 2020-07-20 NOTE — Telephone Encounter (Signed)
Pt c/o swelling: STAT is pt has developed SOB within 24 hours  1) How much weight have you gained and in what time span? Yes - her husband states at least 5lbs.   2) If swelling, where is the swelling located? Ankles & legs   3) Are you currently taking a fluid pill? Yes - torsemide (DEMADEX) 20 MG tablet  4) Are you currently SOB? No   5) Do you have a log of your daily weights (if so, list)?  No   6) Have you gained 3 pounds in a day or 5 pounds in a week? No   7) Have you traveled recently? No   Her husband Ray wants to know does she need readjusting on the meds? He states the swelling has been a issue for 2 weeks. The swelling has gotten better but it is still there.

## 2020-07-21 NOTE — Telephone Encounter (Signed)
Agree with note by Christen Bame, RN She may take additional torsemide over the next 2-3 days . Have him call back to let us know how she is doing.

## 2020-07-21 NOTE — Telephone Encounter (Signed)
Spoke with Tamara Gregory who is aware Dr Acie Fredrickson agreed with pt taking additional torsemide over the next 2-3 days and to call back next week to make him aware of how she is doing.  He was very appreciative of the call and information.

## 2020-07-24 ENCOUNTER — Telehealth: Payer: Self-pay | Admitting: Cardiovascular Disease

## 2020-07-24 NOTE — Telephone Encounter (Signed)
Tamara Gregory is returning Tamara Gregory's call.

## 2020-07-24 NOTE — Telephone Encounter (Signed)
°    8:42 AM Note Spoke with Tamara Gregory who is aware Dr Acie Fredrickson agreed with pt taking additional torsemide over the next 2-3 days and to call back next week to make him aware of how she is doing.  He was very appreciative of the call and information.       Returned Rays call regarding Tamara Gregory, he states that Tamara Gregory took the additional Torsemide as instructed above. Tamara Gregory states that patients legs are still swollen, one leg has a hematoma and another has a ulceration. He is concerned and would like Dr. Lanny Gregory advisement.

## 2020-07-25 ENCOUNTER — Ambulatory Visit: Payer: Medicare Other | Admitting: Nurse Practitioner

## 2020-07-25 ENCOUNTER — Other Ambulatory Visit: Payer: Medicare Other

## 2020-07-25 DIAGNOSIS — H5022 Vertical strabismus, left eye: Secondary | ICD-10-CM | POA: Diagnosis not present

## 2020-07-25 DIAGNOSIS — H5053 Vertical heterophoria: Secondary | ICD-10-CM | POA: Diagnosis not present

## 2020-07-25 DIAGNOSIS — H532 Diplopia: Secondary | ICD-10-CM | POA: Diagnosis not present

## 2020-07-25 DIAGNOSIS — H50332 Intermittent monocular exotropia, left eye: Secondary | ICD-10-CM | POA: Diagnosis not present

## 2020-07-25 DIAGNOSIS — G7000 Myasthenia gravis without (acute) exacerbation: Secondary | ICD-10-CM | POA: Diagnosis not present

## 2020-07-25 MED ORDER — TORSEMIDE 20 MG PO TABS: 20.0000 mg | ORAL_TABLET | Freq: Two times a day (BID) | ORAL | 3 refills | Status: AC

## 2020-07-25 NOTE — Telephone Encounter (Signed)
Medication list has been updated and appointment has been changed.

## 2020-07-25 NOTE — Telephone Encounter (Signed)
I spoke to her husband , Ray. She improved somewhat with the BID Torsemide  dosing  Her legs are less weepy. Will continue torsemide 20 mg BID DC furosemide Please move her appt to Sept. 2 at 1:40  Thanks  Phil

## 2020-07-26 NOTE — Progress Notes (Addendum)
Tamara Gregory   [phone] [phone]   Clinic Follow up Note   Patient Care Team: Binnie Rail, MD as PCP - General (Internal Medicine) Nahser, Wonda Cheng, MD as PCP - Cardiology (Cardiology) Michael Boston, MD as Consulting Physician (General Surgery) Milus Banister, MD as Attending Physician (Gastroenterology) Nahser, Wonda Cheng, MD as Consulting Physician (Cardiology) Alda Berthold, DO as Consulting Physician (Neurology) Feliz Beam, MD as Referring Physician (Ophthalmology) Alda Berthold, DO as Consulting Physician (Neurology) 07/27/2020  CHIEF COMPLAINT: Follow-up right colon cancer, anemia  SUMMARY OF ONCOLOGIC HISTORY: Oncology History Overview Note  Cancer Staging 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 Staging form: Colon and Rectum, AJCC 8th Edition - Pathologic stage from 03/20/2018: Stage IIA (pT3, pN0, cM0) - Signed by Truitt Merle, MD on 07/20/2019    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  03/17/2018 Initial Diagnosis   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   03/20/2018 Cancer Staging   Staging form: Colon and Rectum, AJCC 8th Edition - Pathologic stage from 03/20/2018: Stage IIA (pT3, pN0, cM0) - Signed by Truitt Merle, MD on 07/20/2019   03/05/2019 Initial Biopsy   Diagnosis Surgical [P], cecum mass - INVASIVE MODERATELY DIFFERENTIATED ADENOCARCINOMA. SEE NOTE.   03/18/2019 Pathology Results   Hemicolectomy  Diagnosis 1. Colon, segmental resection for tumor, right colon - INVASIVE COLORECTAL ADENOCARCINOMA, 7.6 CM. - TUMOR EXTENDS INTO SUBSEROSAL CONNECTIVE TISSUE. - MARGINS NOT INVOLVED. - THIRTY-FOUR LYMPH NODES WITH NO METASTATIC CARCINOMA IDENTIFIED (0/34). - THREE SEPARATE TUBULAR ADENOMAS. - BENIGN OVARIAN SEROUS CYSTADENOMA. 2. Skin , skin mass - SEBORRHEIC KERATOSIS. - NO EVIDENCE OF MALIGNANCY.     CURRENT THERAPY: Colon cancer surveillance, oral iron BID for anemia  (stopped 03/2020)  INTERVAL HISTORY: Tamara Gregory returns for follow-up as scheduled.  Her last visit on 03/09/2020 was virtual.  Recent labs at PCP on 06/30/2020 showed hemoglobin 9.6, iron low at 33, ferritin 40.  Her CEA was repeated, mildly elevated at 7.5 which is what it was on 03/04/2018 at the time of her right colectomy and colon cancer diagnosis. A CT AP was obtained due to recent weight loss and abdominal pain which showed no evidence of recurrence or metastasis in the abdomen, but did show new solid pulmonary nodules largest 2.6 cm in the right lower lobe, suspicious for pulmonary metastasis.   Today, she is here with her husband, seen by me virtually from my home office. She is in a wheelchair on 3 L oxygen, usually on 2 at home. She uses O2 continuously and is mostly wheelchair bound. Her energy has declined in the past few months. She feels "sluggish and depressed." she is on zoloft 100 mg once daily. She tried mirtazapine but did not tolerate due to dry mouth. She had a barium study recently which showed "esophagus is not functioning" and has to eat small frequent meals. Her weight fluctuates due to fluid status. She is taking keflex for a large "hematoma on her leg" which is improving per husband, managed by Dr. Quay Burow. Her bowel movements fluctuate between constipation and diarrhea, denies rectal bleeding or abdominal pain. She stopped oral iron at last visit. She denies fever, chills, cough, chest pain, dyspnea.     MEDICAL HISTORY:  Past Medical History:  Diagnosis Date  . Anemia   . Anxiety   . Arthritis   . Cancer (HCC)    hx of skin cancer , colon   . Cataract  removed both eyes   . Chronic back pain    okay with sitting, increased back pain with activity   . Depression   . GERD (gastroesophageal reflux disease)   . Glaucoma   . Headache(784.0)    hx of  . History of kidney stones   . History of migraines   . Hyperlipidemia   . Hypertension   . Macular degeneration   .  Myasthenia gravis ([place])   . Occasional tremors   . Pneumonia   . Spinal stenosis   . Thoracic aneurysm    4.4cm; see CT done 01/28/12 and 01/22/13 in [facility].   [name] Uses hearing aid   . Vitamin D deficiency     SURGICAL HISTORY: Past Surgical History:  Procedure Laterality Date  . ABDOMINAL HYSTERECTOMY    . CHOLECYSTECTOMY    . COLON SURGERY     Right colectomy dr. gross 03-17-18  . HIP ARTHROPLASTY Left 03/11/2019   Procedure: ARTHROPLASTY BIPOLAR HIP (HEMIARTHROPLASTY);  Surgeon: Renette Butters, MD;  Location: [facility];  Service: Orthopedics;  Laterality: Left;  . LUMBAR LAMINECTOMY/DECOMPRESSION MICRODISCECTOMY N/A 02/04/2013   Procedure: CENTRAL DECOMPRESSION/LUMBAR LAMINECTOMY L3-L4 AND L4-L5 2 LEVELS;  Surgeon: Tobi Bastos, MD;  Location: [facility];  Service: Orthopedics;  Laterality: N/A;  . SPINAL CORD STIMULATOR IMPLANT     in right hip but not currently using because it did not help  . TONSILLECTOMY    . UPPER GASTROINTESTINAL ENDOSCOPY    . US ECHOCARDIOGRAPHY  05/18/2007   EF 55-60%    I have reviewed the social history and family history with the patient and they are unchanged from previous note.  ALLERGIES:  is allergic to tramadol.  MEDICATIONS:  Current Outpatient Medications  Medication Sig Dispense Refill  . acetaminophen (TYLENOL) 325 MG tablet Take 2 tablets (650 mg total) by mouth every 6 (six) hours as needed for headache.    [name] aspirin 81 MG chewable tablet Chew 0.5 tablets (40.5 mg total) by mouth daily. 30 tablet 0  . brimonidine (ALPHAGAN) 0.2 % ophthalmic solution Place 1 drop into both eyes 3 (three) times daily.     . cephALEXin (KEFLEX) 500 MG capsule Take 1 capsule (500 mg total) by mouth 3 (three) times daily. 15 capsule 0  . Cholecalciferol (VITAMIN D) 125 MCG (5000 UT) CAPS Take 5,000 Units by mouth daily.     . fluticasone (FLONASE) 50 MCG/ACT nasal spray Place 1 spray into both nostrils at bedtime.    [name] latanoprost (XALATAN) 0.005 % ophthalmic  solution Place 1 drop into the left eye at bedtime.  1  . levothyroxine (SYNTHROID) 25 MCG tablet Take 1 tablet by mouth daily before breakfast. 90 tablet 1  . loperamide (IMODIUM) 2 MG capsule Take 2 mg by mouth at bedtime.    [name] losartan (COZAAR) 25 MG tablet Take 25 mg by mouth daily.    . mirtazapine (REMERON) 7.5 MG tablet Take 7.5 mg by mouth at bedtime.    . Multiple Vitamins-Minerals (PRESERVISION AREDS) CAPS Take 1 capsule by mouth 2 (two) times daily.     . Omega-3 Fatty Acids (FISH OIL) 435 MG CAPS Take 435 mg by mouth daily.    [name] omeprazole (PRILOSEC) 40 MG capsule Take 80 mg by mouth daily.    . ondansetron (ZOFRAN) 4 MG tablet Take 1 tablet every morning, may take second dose daily if needed 60 tablet 1  . potassium chloride SA (KLOR-CON) 20 MEQ tablet Take 20 mEq by mouth  daily.    . predniSONE (DELTASONE) 5 MG tablet Take 10 mg by mouth daily with breakfast.    . rosuvastatin (CRESTOR) 10 MG tablet Take 1 tablet (10 mg total) by mouth daily. 90 tablet 3  . sertraline (ZOLOFT) 100 MG tablet Take 1 tablet by mouth daily.    [name] torsemide (DEMADEX) 20 MG tablet Take 1 tablet (20 mg total) by mouth 2 (two) times daily. 180 tablet 3  . traZODone (DESYREL) 50 MG tablet Take 1-2 tablets (50-100 mg total) by mouth at bedtime as needed for sleep. 180 tablet 3   No current facility-administered medications for this visit.    PHYSICAL EXAMINATION: ECOG PERFORMANCE STATUS: 3 - Symptomatic, >50% confined to bed  Vitals:   07/27/20 0855  BP: 100/62  Pulse: (!) 109  Resp: 20  Temp: 98.1 F (36.7 C)   Filed Weights   07/27/20 0855  Weight: 145 lb 9.6 oz (66 kg)    GENERAL:alert, no distress and comfortable SKIN: skin changes of the lower extremities  LUNGS:  normal breathing effort on 3 liters nasal cannula  HEART:  lower extremity edema ABDOMEN:deferred NEURO: alert & with fluent speech, in wheelchair Limited exam via virtual platform in the midst of COVID19 pandemic    LABORATORY DATA:  I have reviewed the data as listed CBC Latest Ref Rng & Units 07/27/2020 06/30/2020 06/28/2020  WBC 4.0 - 10.5 K/uL 14.8(H) 7.3 8.2  Hemoglobin 12.0 - 15.0 g/dL 9.8(L) 9.6(L) 9.9(L)  Hematocrit 36 - 46 % 32.7(L) 30.7(L) 29.5(L)  Platelets 150 - 400 K/uL 278 234 214.0     CMP Latest Ref Rng & Units 07/27/2020 07/13/2020 06/28/2020  Glucose 70 - 99 mg/dL 205(H) 122(H) 147(H)  BUN 8 - 23 mg/dL 20 21 25(H)  Creatinine 0.44 - 1.00 mg/dL 0.99 0.88 0.75  Sodium 135 - 145 mmol/L 140 136 138  Potassium 3.5 - 5.1 mmol/L 3.4(L) 4.4 4.4  Chloride 98 - 111 mmol/L 90(L) 92(L) 97  CO2 22 - 32 mmol/L 40(H) 36(H) 36(H)  Calcium 8.9 - 10.3 mg/dL 9.9 9.0 9.5  Total Protein 6.5 - 8.1 g/dL 6.6 - -  Total Bilirubin 0.3 - 1.2 mg/dL 0.7 - -  Alkaline Phos 38 - 126 U/L 98 - -  AST 15 - 41 U/L 15 - -  ALT 0 - 44 U/L 20 - -      RADIOGRAPHIC STUDIES: I have personally reviewed the radiological images as listed and agreed with the findings in the report. No results found.   ASSESSMENT & PLAN: Tamara Gregory is a 86 y.o. female with   1. H/orightcolon cancerin 03/2018, pT3N0Mo stage IIA, suspicious pulmonary nodules 07/2020  -Diagnosed in 03/2018, invasive moderate differentiated adenocarcinoma. She is s/p hemicolectomy on 03/18/19. Given early stage cancer, adjuvant chemotherapy was not recommended  -no clinical concern for recurrence at last visit 03/2020, since then she has decreased PS, appetite, weight loss, and new lower lobe pulmonary lesions seen on recent CT APconcerning for metastatic disease -additional work up is pending   2.Iron deficientAnemia  -She has had hg in 8-11rangesince 2018. Her CBC was normal in 10/2016. Normal MCV. Ferritin was low at 8.1 in 12/2017, and 19 in 06/2019 with low serum iron and saturation. She has lab evidence of iron deficiency. B12 was normal in 06/2019.  -she was diagnosed with right colon cancer in 03/2018, her iron deficient anemia is likely  related to her colon cancer. However since her colon surgery,it has been due to  inadequate iron supplement -she was taking oral iron once daily, iron studies normal on 02/08/20, she stopped oral iron  -she has recurrent anemia Hg 9.8 with low reticulocyte hemoglobin, ferritin 53. Iron studies pending. I recommend to restart oral iron   3. Deconditioning, weight loss, low appetite, depression -she broke her hip in 03/2019, ambulates with a walker since then, however in the past 4 months she has functional decline, low appetite, weight loss, and depression. She is wheelchair bound with continuous oxygen -She tried mirtazapine but did not tolerate. She is on zoloft 100 mg qHS. Will defer to PCP for further depression management  -her husband is her primary caregiver at home.   4. H/o skin cancer  -She was diagnosed with Squamous cell carcinoma of left forearm in 10/2016, ofright foot in 04/2018 and of upper back in 12/2018. All were removed.  -She will continue to f/u with her dermatologist    Disposition:  Ms. Osentoski appears chronically ill but stable. She has functional decline in the past few months with low appetite and weight loss. Her recent CT AP work up showed no evidence of cancer recurrence or metastasis in the abdomen or pelvis but shows new pulmonary nodules up to 2.6 cm in the right lower lobe concerning for lung metastasis. We reviewed this today. CEA increased to 10.   She and her husband are interested in pursuing additional work up. We are referring her for PET scan. We will f/u after that to determine if she will proceed with biopsy. If this shows metastatic disease from her colon cancer, she might be a candidate for radiation if she has limited metastatic disease (no more than 3-4 lesions) but overall her treatment options will be limited due to her age, poor PS, and co-morbidities.   CBC reviewed, I recommend to resume oral iron once daily. She has mild leukocytosis which could be  related to her prednisone (for myasthenia gravis) vs infection. She is on keflex for lower extremities per PCP.   Follow up after PET scan. The patient was seen with Tamara Gregory.   Orders Placed This Encounter  Procedures  . NM PET Image Initial (PI) Skull Base To Thigh    Standing Status:   Future    Standing Expiration Date:   07/27/2021    Order Specific Question:   If indicated for the ordered procedure, I authorize the administration of a radiopharmaceutical per Radiology protocol    Answer:   Yes    Order Specific Question:   Preferred imaging location?    Answer:   [facility]    Order Specific Question:   Radiology Contrast Protocol - do NOT remove file path    Answer:   [email or web address]   All questions were answered. The patient knows to call the clinic with any problems, questions or concerns. No barriers to learning were detected.     Alla Feeling, NP 07/27/20   Addendum  I have seen the patient, examined her. I agree with the assessment and and plan and have edited the notes.   Tamara Gregory was recently referred back due to her abnormal lung nodules on recent CT AP. I reviewed her images personally. This is highly suspicious for malignancy especially metastatic disease, possible from her previous colon cancer. I recommend a PET scan for further evaluation. Tissue biopsy would be considered if treatment is planned. Unfortunately given her age,medical comorbilites and poor PS, she would not be a candidate for chemo, even Xeloda. I will  check her previously colon cancer for MMR/MSI (not done previously) to see if she is a candidate for immunotherapy. I will see her back after her PET. All questions answered.   Truitt Merle  07/27/2020

## 2020-07-27 ENCOUNTER — Inpatient Hospital Stay: Payer: Medicare Other | Attending: Nurse Practitioner

## 2020-07-27 ENCOUNTER — Other Ambulatory Visit: Payer: Self-pay

## 2020-07-27 ENCOUNTER — Encounter: Payer: Self-pay | Admitting: Nurse Practitioner

## 2020-07-27 ENCOUNTER — Inpatient Hospital Stay: Payer: Medicare Other | Admitting: Nurse Practitioner

## 2020-07-27 VITALS — BP 100/62 | HR 109 | Temp 98.1°F | Resp 20 | Ht 62.0 in | Wt 145.6 lb

## 2020-07-27 DIAGNOSIS — R918 Other nonspecific abnormal finding of lung field: Secondary | ICD-10-CM | POA: Diagnosis not present

## 2020-07-27 DIAGNOSIS — F329 Major depressive disorder, single episode, unspecified: Secondary | ICD-10-CM | POA: Diagnosis not present

## 2020-07-27 DIAGNOSIS — R109 Unspecified abdominal pain: Secondary | ICD-10-CM | POA: Diagnosis not present

## 2020-07-27 DIAGNOSIS — Z85820 Personal history of malignant melanoma of skin: Secondary | ICD-10-CM | POA: Insufficient documentation

## 2020-07-27 DIAGNOSIS — D509 Iron deficiency anemia, unspecified: Secondary | ICD-10-CM | POA: Diagnosis not present

## 2020-07-27 DIAGNOSIS — R630 Anorexia: Secondary | ICD-10-CM | POA: Insufficient documentation

## 2020-07-27 DIAGNOSIS — R634 Abnormal weight loss: Secondary | ICD-10-CM | POA: Diagnosis not present

## 2020-07-27 DIAGNOSIS — Z9981 Dependence on supplemental oxygen: Secondary | ICD-10-CM | POA: Diagnosis not present

## 2020-07-27 DIAGNOSIS — Z8701 Personal history of pneumonia (recurrent): Secondary | ICD-10-CM | POA: Insufficient documentation

## 2020-07-27 DIAGNOSIS — L989 Disorder of the skin and subcutaneous tissue, unspecified: Secondary | ICD-10-CM | POA: Diagnosis not present

## 2020-07-27 DIAGNOSIS — Z85038 Personal history of other malignant neoplasm of large intestine: Secondary | ICD-10-CM | POA: Insufficient documentation

## 2020-07-27 DIAGNOSIS — Z90710 Acquired absence of both cervix and uterus: Secondary | ICD-10-CM | POA: Diagnosis not present

## 2020-07-27 DIAGNOSIS — C180 Malignant neoplasm of cecum: Secondary | ICD-10-CM

## 2020-07-27 DIAGNOSIS — D500 Iron deficiency anemia secondary to blood loss (chronic): Secondary | ICD-10-CM

## 2020-07-27 LAB — CBC WITH DIFFERENTIAL (CANCER CENTER ONLY)
Abs Immature Granulocytes: 0.25 10*3/uL — ABNORMAL HIGH (ref 0.00–0.07)
Basophils Absolute: 0 10*3/uL (ref 0.0–0.1)
Basophils Relative: 0 %
Eosinophils Absolute: 0.3 10*3/uL (ref 0.0–0.5)
Eosinophils Relative: 2 %
HCT: 32.7 % — ABNORMAL LOW (ref 36.0–46.0)
Hemoglobin: 9.8 g/dL — ABNORMAL LOW (ref 12.0–15.0)
Immature Granulocytes: 2 %
Lymphocytes Relative: 5 %
Lymphs Abs: 0.7 10*3/uL (ref 0.7–4.0)
MCH: 28.7 pg (ref 26.0–34.0)
MCHC: 30 g/dL (ref 30.0–36.0)
MCV: 95.6 fL (ref 80.0–100.0)
Monocytes Absolute: 2.8 10*3/uL — ABNORMAL HIGH (ref 0.1–1.0)
Monocytes Relative: 19 %
Neutro Abs: 10.8 10*3/uL — ABNORMAL HIGH (ref 1.7–7.7)
Neutrophils Relative %: 72 %
Platelet Count: 278 10*3/uL (ref 150–400)
RBC: 3.42 MIL/uL — ABNORMAL LOW (ref 3.87–5.11)
RDW: 15.7 % — ABNORMAL HIGH (ref 11.5–15.5)
WBC Count: 14.8 10*3/uL — ABNORMAL HIGH (ref 4.0–10.5)
nRBC: 0 % (ref 0.0–0.2)

## 2020-07-27 LAB — CMP (CANCER CENTER ONLY)
ALT: 20 U/L (ref 0–44)
AST: 15 U/L (ref 15–41)
Albumin: 3.2 g/dL — ABNORMAL LOW (ref 3.5–5.0)
Alkaline Phosphatase: 98 U/L (ref 38–126)
Anion gap: 10 (ref 5–15)
BUN: 20 mg/dL (ref 8–23)
CO2: 40 mmol/L — ABNORMAL HIGH (ref 22–32)
Calcium: 9.9 mg/dL (ref 8.9–10.3)
Chloride: 90 mmol/L — ABNORMAL LOW (ref 98–111)
Creatinine: 0.99 mg/dL (ref 0.44–1.00)
GFR, Est AFR Am: 57 mL/min — ABNORMAL LOW (ref >60)
GFR, Estimated: 49 mL/min — ABNORMAL LOW (ref >60)
Glucose, Bld: 205 mg/dL — ABNORMAL HIGH (ref 70–99)
Potassium: 3.4 mmol/L — ABNORMAL LOW (ref 3.5–5.1)
Sodium: 140 mmol/L (ref 135–145)
Total Bilirubin: 0.7 mg/dL (ref 0.3–1.2)
Total Protein: 6.6 g/dL (ref 6.5–8.1)

## 2020-07-27 LAB — IRON AND TIBC
Iron: 36 ug/dL — ABNORMAL LOW (ref 41–142)
Saturation Ratios: 8 % — ABNORMAL LOW (ref 21–57)
TIBC: 451 ug/dL — ABNORMAL HIGH (ref 236–444)
UIBC: 415 ug/dL — ABNORMAL HIGH (ref 120–384)

## 2020-07-27 LAB — RETIC PANEL
Immature Retic Fract: 21.7 % — ABNORMAL HIGH (ref 2.3–15.9)
RBC.: 3.45 MIL/uL — ABNORMAL LOW (ref 3.87–5.11)
Retic Count, Absolute: 93.5 10*3/uL (ref 19.0–186.0)
Retic Ct Pct: 2.7 % (ref 0.4–3.1)
Reticulocyte Hemoglobin: 26.9 pg — ABNORMAL LOW (ref >27.9)

## 2020-07-27 LAB — CEA (IN HOUSE-CHCC): CEA (CHCC-In House): 10.57 ng/mL — ABNORMAL HIGH (ref 0.00–5.00)

## 2020-07-27 LAB — FERRITIN: Ferritin: 53 ng/mL (ref 11–307)

## 2020-07-28 ENCOUNTER — Telehealth: Payer: Self-pay | Admitting: Nurse Practitioner

## 2020-07-28 ENCOUNTER — Telehealth: Payer: Self-pay | Admitting: Internal Medicine

## 2020-07-28 NOTE — Telephone Encounter (Signed)
Scheduled per 8/26 los. Spoke with pt's husband and is aware of appt time and date.

## 2020-07-28 NOTE — Progress Notes (Signed)
  Chronic Care Management   Note  07/28/2020 Name: Tamara Gregory MRN: [id number] DOB: 11/08/1925  Tamara Gregory is a 86 y.o. year old female who is a primary care patient of Burns, Claudina Lick, MD. I reached out to Tamara Gregory by phone today in response to a referral sent by Ms. Dalyla B Helvey's PCP, Quay Burow, Claudina Lick, MD.   Tamara Gregory was given information about Chronic Care Management services today including:  1. CCM service includes personalized support from designated clinical staff supervised by her physician, including individualized plan of care and coordination with other care providers 2. 24/7 contact phone numbers for assistance for urgent and routine care needs. 3. Service will only be billed when office clinical staff spend 20 minutes or more in a month to coordinate care. 4. Only one practitioner may furnish and bill the service in a calendar month. 5. The patient may stop CCM services at any time (effective at the end of the month) by phone call to the office staff.   Patient agreed to services and verbal consent obtained.   Follow up plan:   Earney [place] Upstream Scheduler

## 2020-08-01 ENCOUNTER — Telehealth: Payer: Self-pay

## 2020-08-01 NOTE — Telephone Encounter (Signed)
-----   Message from Truitt Merle, MD sent at 07/28/2020  7:18 AM EDT ----- Please let pt know that her iron level is low, I do not see oral iron on her medication list, I recommend her to start oral ferrous sulfate 1 tab daily. If she does not tolerate well, or if she is already on oral iron, we can set up iv Venofer. Thanks   Truitt Merle  07/28/2020

## 2020-08-01 NOTE — Telephone Encounter (Signed)
Call patient, spoke with husband. Gave patient's husband results and orders per Dr Burr Medico. He verbalized understanding.

## 2020-08-02 ENCOUNTER — Inpatient Hospital Stay (HOSPITAL_COMMUNITY)
Admission: EM | Admit: 2020-08-02 | Discharge: 2020-09-01 | DRG: 871 | Disposition: E | Payer: Medicare Other | Attending: Internal Medicine | Admitting: Internal Medicine

## 2020-08-02 ENCOUNTER — Emergency Department (HOSPITAL_COMMUNITY): Payer: Medicare Other

## 2020-08-02 ENCOUNTER — Ambulatory Visit: Payer: Medicare Other | Admitting: Gastroenterology

## 2020-08-02 ENCOUNTER — Other Ambulatory Visit: Payer: Self-pay

## 2020-08-02 DIAGNOSIS — E039 Hypothyroidism, unspecified: Secondary | ICD-10-CM | POA: Diagnosis present

## 2020-08-02 DIAGNOSIS — E46 Unspecified protein-calorie malnutrition: Secondary | ICD-10-CM | POA: Diagnosis not present

## 2020-08-02 DIAGNOSIS — I340 Nonrheumatic mitral (valve) insufficiency: Secondary | ICD-10-CM | POA: Diagnosis not present

## 2020-08-02 DIAGNOSIS — I10 Essential (primary) hypertension: Secondary | ICD-10-CM | POA: Diagnosis not present

## 2020-08-02 DIAGNOSIS — C7801 Secondary malignant neoplasm of right lung: Secondary | ICD-10-CM | POA: Diagnosis present

## 2020-08-02 DIAGNOSIS — Z90710 Acquired absence of both cervix and uterus: Secondary | ICD-10-CM | POA: Diagnosis not present

## 2020-08-02 DIAGNOSIS — Z85038 Personal history of other malignant neoplasm of large intestine: Secondary | ICD-10-CM | POA: Diagnosis not present

## 2020-08-02 DIAGNOSIS — I2510 Atherosclerotic heart disease of native coronary artery without angina pectoris: Secondary | ICD-10-CM | POA: Diagnosis present

## 2020-08-02 DIAGNOSIS — Z85828 Personal history of other malignant neoplasm of skin: Secondary | ICD-10-CM

## 2020-08-02 DIAGNOSIS — G8194 Hemiplegia, unspecified affecting left nondominant side: Secondary | ICD-10-CM | POA: Diagnosis not present

## 2020-08-02 DIAGNOSIS — R627 Adult failure to thrive: Secondary | ICD-10-CM | POA: Diagnosis not present

## 2020-08-02 DIAGNOSIS — Z515 Encounter for palliative care: Secondary | ICD-10-CM | POA: Diagnosis not present

## 2020-08-02 DIAGNOSIS — Z9049 Acquired absence of other specified parts of digestive tract: Secondary | ICD-10-CM | POA: Diagnosis not present

## 2020-08-02 DIAGNOSIS — I480 Paroxysmal atrial fibrillation: Secondary | ICD-10-CM | POA: Diagnosis present

## 2020-08-02 DIAGNOSIS — I63413 Cerebral infarction due to embolism of bilateral middle cerebral arteries: Secondary | ICD-10-CM | POA: Diagnosis present

## 2020-08-02 DIAGNOSIS — Z79890 Hormone replacement therapy (postmenopausal): Secondary | ICD-10-CM

## 2020-08-02 DIAGNOSIS — G319 Degenerative disease of nervous system, unspecified: Secondary | ICD-10-CM | POA: Diagnosis not present

## 2020-08-02 DIAGNOSIS — R404 Transient alteration of awareness: Secondary | ICD-10-CM | POA: Diagnosis not present

## 2020-08-02 DIAGNOSIS — E785 Hyperlipidemia, unspecified: Secondary | ICD-10-CM | POA: Diagnosis not present

## 2020-08-02 DIAGNOSIS — I110 Hypertensive heart disease with heart failure: Secondary | ICD-10-CM | POA: Diagnosis not present

## 2020-08-02 DIAGNOSIS — G7000 Myasthenia gravis without (acute) exacerbation: Secondary | ICD-10-CM | POA: Diagnosis not present

## 2020-08-02 DIAGNOSIS — E8809 Other disorders of plasma-protein metabolism, not elsewhere classified: Secondary | ICD-10-CM | POA: Diagnosis not present

## 2020-08-02 DIAGNOSIS — A419 Sepsis, unspecified organism: Secondary | ICD-10-CM | POA: Diagnosis not present

## 2020-08-02 DIAGNOSIS — C7802 Secondary malignant neoplasm of left lung: Secondary | ICD-10-CM | POA: Diagnosis not present

## 2020-08-02 DIAGNOSIS — H353290 Exudative age-related macular degeneration, unspecified eye, stage unspecified: Secondary | ICD-10-CM | POA: Diagnosis present

## 2020-08-02 DIAGNOSIS — J9621 Acute and chronic respiratory failure with hypoxia: Secondary | ICD-10-CM | POA: Diagnosis not present

## 2020-08-02 DIAGNOSIS — E440 Moderate protein-calorie malnutrition: Secondary | ICD-10-CM | POA: Diagnosis not present

## 2020-08-02 DIAGNOSIS — Z7401 Bed confinement status: Secondary | ICD-10-CM

## 2020-08-02 DIAGNOSIS — Z993 Dependence on wheelchair: Secondary | ICD-10-CM

## 2020-08-02 DIAGNOSIS — Z20822 Contact with and (suspected) exposure to covid-19: Secondary | ICD-10-CM | POA: Diagnosis present

## 2020-08-02 DIAGNOSIS — J9601 Acute respiratory failure with hypoxia: Secondary | ICD-10-CM | POA: Diagnosis not present

## 2020-08-02 DIAGNOSIS — I63512 Cerebral infarction due to unspecified occlusion or stenosis of left middle cerebral artery: Secondary | ICD-10-CM

## 2020-08-02 DIAGNOSIS — I712 Thoracic aortic aneurysm, without rupture, unspecified: Secondary | ICD-10-CM

## 2020-08-02 DIAGNOSIS — Z66 Do not resuscitate: Secondary | ICD-10-CM | POA: Diagnosis not present

## 2020-08-02 DIAGNOSIS — I63233 Cerebral infarction due to unspecified occlusion or stenosis of bilateral carotid arteries: Secondary | ICD-10-CM | POA: Diagnosis not present

## 2020-08-02 DIAGNOSIS — E273 Drug-induced adrenocortical insufficiency: Secondary | ICD-10-CM | POA: Diagnosis not present

## 2020-08-02 DIAGNOSIS — I5043 Acute on chronic combined systolic (congestive) and diastolic (congestive) heart failure: Secondary | ICD-10-CM | POA: Diagnosis present

## 2020-08-02 DIAGNOSIS — I63411 Cerebral infarction due to embolism of right middle cerebral artery: Secondary | ICD-10-CM

## 2020-08-02 DIAGNOSIS — Z7189 Other specified counseling: Secondary | ICD-10-CM

## 2020-08-02 DIAGNOSIS — Z743 Need for continuous supervision: Secondary | ICD-10-CM | POA: Diagnosis not present

## 2020-08-02 DIAGNOSIS — C180 Malignant neoplasm of cecum: Secondary | ICD-10-CM | POA: Diagnosis not present

## 2020-08-02 DIAGNOSIS — R1111 Vomiting without nausea: Secondary | ICD-10-CM | POA: Diagnosis not present

## 2020-08-02 DIAGNOSIS — F418 Other specified anxiety disorders: Secondary | ICD-10-CM | POA: Diagnosis present

## 2020-08-02 DIAGNOSIS — R6521 Severe sepsis with septic shock: Secondary | ICD-10-CM | POA: Diagnosis not present

## 2020-08-02 DIAGNOSIS — I350 Nonrheumatic aortic (valve) stenosis: Secondary | ICD-10-CM | POA: Diagnosis not present

## 2020-08-02 DIAGNOSIS — I639 Cerebral infarction, unspecified: Secondary | ICD-10-CM | POA: Diagnosis not present

## 2020-08-02 DIAGNOSIS — Z7982 Long term (current) use of aspirin: Secondary | ICD-10-CM

## 2020-08-02 DIAGNOSIS — Z9981 Dependence on supplemental oxygen: Secondary | ICD-10-CM

## 2020-08-02 DIAGNOSIS — G9341 Metabolic encephalopathy: Secondary | ICD-10-CM | POA: Diagnosis not present

## 2020-08-02 DIAGNOSIS — I252 Old myocardial infarction: Secondary | ICD-10-CM

## 2020-08-02 DIAGNOSIS — R29718 NIHSS score 18: Secondary | ICD-10-CM | POA: Diagnosis present

## 2020-08-02 DIAGNOSIS — I499 Cardiac arrhythmia, unspecified: Secondary | ICD-10-CM | POA: Diagnosis not present

## 2020-08-02 DIAGNOSIS — R0600 Dyspnea, unspecified: Secondary | ICD-10-CM | POA: Diagnosis not present

## 2020-08-02 DIAGNOSIS — Z96642 Presence of left artificial hip joint: Secondary | ICD-10-CM | POA: Diagnosis not present

## 2020-08-02 DIAGNOSIS — J90 Pleural effusion, not elsewhere classified: Secondary | ICD-10-CM | POA: Diagnosis not present

## 2020-08-02 DIAGNOSIS — I6782 Cerebral ischemia: Secondary | ICD-10-CM | POA: Diagnosis not present

## 2020-08-02 DIAGNOSIS — F0390 Unspecified dementia without behavioral disturbance: Secondary | ICD-10-CM | POA: Diagnosis present

## 2020-08-02 DIAGNOSIS — C44729 Squamous cell carcinoma of skin of left lower limb, including hip: Secondary | ICD-10-CM | POA: Diagnosis present

## 2020-08-02 DIAGNOSIS — I517 Cardiomegaly: Secondary | ICD-10-CM | POA: Diagnosis not present

## 2020-08-02 DIAGNOSIS — E119 Type 2 diabetes mellitus without complications: Secondary | ICD-10-CM | POA: Diagnosis present

## 2020-08-02 DIAGNOSIS — D72829 Elevated white blood cell count, unspecified: Secondary | ICD-10-CM | POA: Diagnosis not present

## 2020-08-02 DIAGNOSIS — N390 Urinary tract infection, site not specified: Secondary | ICD-10-CM | POA: Diagnosis not present

## 2020-08-02 DIAGNOSIS — E559 Vitamin D deficiency, unspecified: Secondary | ICD-10-CM | POA: Diagnosis present

## 2020-08-02 DIAGNOSIS — F3289 Other specified depressive episodes: Secondary | ICD-10-CM | POA: Diagnosis not present

## 2020-08-02 DIAGNOSIS — K219 Gastro-esophageal reflux disease without esophagitis: Secondary | ICD-10-CM | POA: Diagnosis present

## 2020-08-02 DIAGNOSIS — R29810 Facial weakness: Secondary | ICD-10-CM | POA: Diagnosis present

## 2020-08-02 DIAGNOSIS — R0689 Other abnormalities of breathing: Secondary | ICD-10-CM | POA: Diagnosis not present

## 2020-08-02 DIAGNOSIS — F32A Depression, unspecified: Secondary | ICD-10-CM | POA: Diagnosis present

## 2020-08-02 DIAGNOSIS — Z7952 Long term (current) use of systemic steroids: Secondary | ICD-10-CM

## 2020-08-02 DIAGNOSIS — I361 Nonrheumatic tricuspid (valve) insufficiency: Secondary | ICD-10-CM | POA: Diagnosis not present

## 2020-08-02 DIAGNOSIS — R29818 Other symptoms and signs involving the nervous system: Secondary | ICD-10-CM | POA: Diagnosis not present

## 2020-08-02 DIAGNOSIS — T380X5A Adverse effect of glucocorticoids and synthetic analogues, initial encounter: Secondary | ICD-10-CM | POA: Diagnosis present

## 2020-08-02 DIAGNOSIS — I63311 Cerebral infarction due to thrombosis of right middle cerebral artery: Secondary | ICD-10-CM | POA: Diagnosis not present

## 2020-08-02 DIAGNOSIS — Z79899 Other long term (current) drug therapy: Secondary | ICD-10-CM

## 2020-08-02 LAB — I-STAT CHEM 8, ED
BUN: 34 mg/dL — ABNORMAL HIGH (ref 8–23)
Calcium, Ion: 1.03 mmol/L — ABNORMAL LOW (ref 1.15–1.40)
Chloride: 91 mmol/L — ABNORMAL LOW (ref 98–111)
Creatinine, Ser: 0.9 mg/dL (ref 0.44–1.00)
Glucose, Bld: 156 mg/dL — ABNORMAL HIGH (ref 70–99)
HCT: 33 % — ABNORMAL LOW (ref 36.0–46.0)
Hemoglobin: 11.2 g/dL — ABNORMAL LOW (ref 12.0–15.0)
Potassium: 4.5 mmol/L (ref 3.5–5.1)
Sodium: 136 mmol/L (ref 135–145)
TCO2: 38 mmol/L — ABNORMAL HIGH (ref 22–32)

## 2020-08-02 LAB — COMPREHENSIVE METABOLIC PANEL
ALT: 33 U/L (ref 0–44)
AST: 37 U/L (ref 15–41)
Albumin: 3.2 g/dL — ABNORMAL LOW (ref 3.5–5.0)
Alkaline Phosphatase: 83 U/L (ref 38–126)
Anion gap: 15 (ref 5–15)
BUN: 24 mg/dL — ABNORMAL HIGH (ref 8–23)
CO2: 32 mmol/L (ref 22–32)
Calcium: 9.1 mg/dL (ref 8.9–10.3)
Chloride: 90 mmol/L — ABNORMAL LOW (ref 98–111)
Creatinine, Ser: 1.07 mg/dL — ABNORMAL HIGH (ref 0.44–1.00)
GFR calc Af Amer: 52 mL/min — ABNORMAL LOW (ref >60)
GFR calc non Af Amer: 45 mL/min — ABNORMAL LOW (ref >60)
Glucose, Bld: 154 mg/dL — ABNORMAL HIGH (ref 70–99)
Potassium: 4.1 mmol/L (ref 3.5–5.1)
Sodium: 137 mmol/L (ref 135–145)
Total Bilirubin: 1.3 mg/dL — ABNORMAL HIGH (ref 0.3–1.2)
Total Protein: 6.7 g/dL (ref 6.5–8.1)

## 2020-08-02 LAB — DIFFERENTIAL
Abs Immature Granulocytes: 0.44 10*3/uL — ABNORMAL HIGH (ref 0.00–0.07)
Basophils Absolute: 0 10*3/uL (ref 0.0–0.1)
Basophils Relative: 0 %
Eosinophils Absolute: 0.2 10*3/uL (ref 0.0–0.5)
Eosinophils Relative: 1 %
Immature Granulocytes: 2 %
Lymphocytes Relative: 2 %
Lymphs Abs: 0.4 10*3/uL — ABNORMAL LOW (ref 0.7–4.0)
Monocytes Absolute: 5.4 10*3/uL — ABNORMAL HIGH (ref 0.1–1.0)
Monocytes Relative: 28 %
Neutro Abs: 13.1 10*3/uL — ABNORMAL HIGH (ref 1.7–7.7)
Neutrophils Relative %: 67 %

## 2020-08-02 LAB — ETHANOL: Alcohol, Ethyl (B): <10 mg/dL (ref <10)

## 2020-08-02 LAB — SARS CORONAVIRUS 2 BY RT PCR (HOSPITAL ORDER, PERFORMED IN ~~LOC~~ HOSPITAL LAB): SARS Coronavirus 2: NEGATIVE

## 2020-08-02 LAB — CBC
HCT: 33.7 % — ABNORMAL LOW (ref 36.0–46.0)
Hemoglobin: 9.9 g/dL — ABNORMAL LOW (ref 12.0–15.0)
MCH: 28 pg (ref 26.0–34.0)
MCHC: 29.4 g/dL — ABNORMAL LOW (ref 30.0–36.0)
MCV: 95.2 fL (ref 80.0–100.0)
Platelets: 273 10*3/uL (ref 150–400)
RBC: 3.54 MIL/uL — ABNORMAL LOW (ref 3.87–5.11)
RDW: 15.9 % — ABNORMAL HIGH (ref 11.5–15.5)
WBC: 19.6 10*3/uL — ABNORMAL HIGH (ref 4.0–10.5)
nRBC: 0 % (ref 0.0–0.2)

## 2020-08-02 LAB — PROTIME-INR
INR: 1.3 — ABNORMAL HIGH (ref 0.8–1.2)
Prothrombin Time: 15.3 seconds — ABNORMAL HIGH (ref 11.4–15.2)

## 2020-08-02 LAB — APTT: aPTT: 26 seconds (ref 24–36)

## 2020-08-02 MED ORDER — DIGOXIN 0.25 MG/ML IJ SOLN
0.2500 mg | Freq: Four times a day (QID) | INTRAMUSCULAR | Status: AC
  Administered 2020-08-02 – 2020-08-03 (×3): 0.25 mg via INTRAVENOUS
  Filled 2020-08-02 (×3): qty 2

## 2020-08-02 MED ORDER — SODIUM CHLORIDE 0.9 % IV SOLN: INTRAVENOUS | Status: DC

## 2020-08-02 MED ORDER — ASPIRIN 81 MG PO CHEW
324.0000 mg | CHEWABLE_TABLET | Freq: Every day | ORAL | Status: DC
  Administered 2020-08-02: 324 mg via ORAL
  Filled 2020-08-02: qty 4

## 2020-08-02 MED ORDER — ATORVASTATIN CALCIUM 80 MG PO TABS: 80.0000 mg | ORAL_TABLET | Freq: Every day | ORAL | Status: DC

## 2020-08-02 MED ORDER — IPRATROPIUM BROMIDE 0.02 % IN SOLN: 0.5000 mg | Freq: Four times a day (QID) | RESPIRATORY_TRACT | Status: DC | PRN

## 2020-08-02 MED ORDER — PREDNISONE 20 MG PO TABS: 10.0000 mg | ORAL_TABLET | Freq: Every day | ORAL | Status: DC

## 2020-08-02 MED ORDER — DILTIAZEM LOAD VIA INFUSION
10.0000 mg | Freq: Once | INTRAVENOUS | Status: DC
  Filled 2020-08-02: qty 10

## 2020-08-02 MED ORDER — MORPHINE SULFATE (PF) 4 MG/ML IV SOLN
4.0000 mg | Freq: Once | INTRAVENOUS | Status: AC
  Administered 2020-08-02: 4 mg via INTRAVENOUS
  Filled 2020-08-02: qty 1

## 2020-08-02 MED ORDER — LATANOPROST 0.005 % OP SOLN
1.0000 [drp] | Freq: Every day | OPHTHALMIC | Status: DC
  Administered 2020-08-02 – 2020-08-04 (×3): 1 [drp] via OPHTHALMIC
  Filled 2020-08-02 (×2): qty 2.5

## 2020-08-02 MED ORDER — BRIMONIDINE TARTRATE 0.2 % OP SOLN
1.0000 [drp] | Freq: Three times a day (TID) | OPHTHALMIC | Status: DC
  Administered 2020-08-03 – 2020-08-04 (×5): 1 [drp] via OPHTHALMIC
  Filled 2020-08-02 (×2): qty 5

## 2020-08-02 MED ORDER — IOHEXOL 350 MG/ML SOLN
100.0000 mL | Freq: Once | INTRAVENOUS | Status: AC | PRN
  Administered 2020-08-02: 100 mL via INTRAVENOUS

## 2020-08-02 MED ORDER — IPRATROPIUM BROMIDE 0.02 % IN SOLN: 0.5000 mg | Freq: Four times a day (QID) | RESPIRATORY_TRACT | Status: DC

## 2020-08-02 MED ORDER — DILTIAZEM HCL-DEXTROSE 125-5 MG/125ML-% IV SOLN (PREMIX)
5.0000 mg/h | INTRAVENOUS | Status: DC
  Administered 2020-08-02: 5 mg/h via INTRAVENOUS
  Filled 2020-08-02: qty 125

## 2020-08-02 MED ORDER — MIRTAZAPINE 7.5 MG PO TABS: 7.5000 mg | ORAL_TABLET | Freq: Every day | ORAL | Status: DC

## 2020-08-02 MED ORDER — STROKE: EARLY STAGES OF RECOVERY BOOK: Freq: Once | Status: DC

## 2020-08-02 MED ORDER — ENOXAPARIN SODIUM 30 MG/0.3ML ~~LOC~~ SOLN
30.0000 mg | SUBCUTANEOUS | Status: DC
  Administered 2020-08-02 – 2020-08-03 (×2): 30 mg via SUBCUTANEOUS
  Filled 2020-08-02 (×2): qty 0.3

## 2020-08-02 MED ORDER — ACETAMINOPHEN 650 MG RE SUPP: 650.0000 mg | RECTAL | Status: DC | PRN

## 2020-08-02 MED ORDER — ROSUVASTATIN CALCIUM 5 MG PO TABS: 10.0000 mg | ORAL_TABLET | Freq: Every day | ORAL | Status: DC

## 2020-08-02 MED ORDER — HYDROCORTISONE NA SUCCINATE PF 100 MG IJ SOLR
50.0000 mg | Freq: Three times a day (TID) | INTRAMUSCULAR | Status: DC
  Administered 2020-08-02 – 2020-08-04 (×8): 50 mg via INTRAVENOUS
  Filled 2020-08-02 (×8): qty 2

## 2020-08-02 MED ORDER — SENNOSIDES-DOCUSATE SODIUM 8.6-50 MG PO TABS: 1.0000 | ORAL_TABLET | Freq: Every evening | ORAL | Status: DC | PRN

## 2020-08-02 MED ORDER — SERTRALINE HCL 100 MG PO TABS
100.0000 mg | ORAL_TABLET | Freq: Every day | ORAL | Status: DC
  Filled 2020-08-02 (×2): qty 1

## 2020-08-02 MED ORDER — ACETAMINOPHEN 160 MG/5ML PO SOLN: 650.0000 mg | ORAL | Status: DC | PRN

## 2020-08-02 MED ORDER — FLUTICASONE PROPIONATE 50 MCG/ACT NA SUSP
1.0000 | Freq: Every day | NASAL | Status: DC
  Administered 2020-08-02 – 2020-08-04 (×3): 1 via NASAL
  Filled 2020-08-02 (×2): qty 16

## 2020-08-02 MED ORDER — CLOPIDOGREL BISULFATE 75 MG PO TABS
75.0000 mg | ORAL_TABLET | Freq: Every day | ORAL | Status: DC
  Administered 2020-08-02: 75 mg via ORAL
  Filled 2020-08-02: qty 1

## 2020-08-02 MED ORDER — TRAZODONE HCL 50 MG PO TABS: 50.0000 mg | ORAL_TABLET | Freq: Every evening | ORAL | Status: DC | PRN

## 2020-08-02 MED ORDER — ACETAMINOPHEN 325 MG PO TABS: 650.0000 mg | ORAL_TABLET | ORAL | Status: DC | PRN

## 2020-08-02 NOTE — Consult Note (Signed)
WOC Nurse Consult Note: Patient seen in  [place]. Spouse present during evaluation. Spouse states RLE hematoma has been present for a while with no known cause.  Reason for Consult: Leg wounds Wound type: Healing hematoma on RLE. LLE without open wounds, only dry flaky skin.  Pressure Injury POA: NA Measurement: RLE hematoma 3.8cm x 2.8 cm  Wound bed: Dried and black Drainage (amount, consistency, odor) None Periwound: Intact Dressing procedure/placement/frequency: Place ABD pad on RLE hematoma (currently black and dry), then wrap with kerlix. Do not use tape on skin.  Place Prevelon heel lift boots bilaterally.  Wound, Ostomy, Continence Wound Treatment Associate [place] Tamala Julian, MSN, RN, [place], [place], [place] Office [phone]

## 2020-08-02 NOTE — Code Documentation (Signed)
Stroke Response Nurse Documentation Code Documentation  Tamara Gregory is a 86 y.o. female arriving to [place] ED via [facility] on 08/15/2020 with past medical hx of hypertension, hyperlipidemia, depression, migraines, myasthenia gravis, skin and colon cancer, anemia and GERD.  Code stroke was activated by ED. Patient from home where she was LKW at 2200 08/01/20. Patient lives with husband. He helps her into her wheelchair each morning and noted she had difficulty moving. He was able to get her into her wheelchair, but noted she was leaning to the left which is not normal for her. Per EMS, son states facial droop is new and husband states it is normal.   Stroke team at the bedside. Labs drawn and patient taken to CT with team. NIHSS 18, see documentation for details and code stroke times. Patient with disoriented, right gaze preference , left hemianopia, left facial droop, left arm weakness, bilateral leg weakness, left decreased sensation, Expressive aphasia , dysarthria  and Visual  neglect on exam. The following imaging was completed: CT, CTA head and neck, CTP. Krista Blue, PA assessed patient during CT process.  Patient is not a candidate for tPA due to out of the window. CTA "Acute occlusion right distal M1 segment". Not a candidate for IR due to modified rankin of 4. Care/Plan Q2 VS and neuro. Admit for stroke workup. Bedside handoff with [facility] ED RN.  Leverne Humbles  Stroke Response RN

## 2020-08-02 NOTE — ED Notes (Signed)
Per report gotten at 15:30 pt came from home for complain of LOC. LSW 08/01/20 at 22:00.

## 2020-08-02 NOTE — ED Provider Notes (Signed)
Medical screening examination/treatment/procedure(s) were conducted as a shared visit with non-physician practitioner(s) and myself.  I personally evaluated the patient during the encounter.    Patient presents as a code stroke.  Patient is wheelchair-bound and chronically on 3 L supplemental oxygen.  She has had some chronic decline but acutely had change in baseline function.  Patient typically is able to assist from wheelchair to toilet independently.  This morning she could not get out of bed which prompted EMS call.  Patient does have known history of cecal cancer.  I have evaluated the patient after her initial evaluation by CT and angiography.  At this time she is acutely developed some rectal pain.  She does appear very uncomfortable and think she may need to have a bowel movement.  He is not having any active chest pain or upper abdominal pain.  Patient is alert.  Mild increased work of breathing.  Heart is irregularly irregular and tachycardic.  Abdomen is soft without guarding.  Lower extremities have peripheral edema and skin thinning with wounds of various age but particularly one larger eschar on the right lower extremity.  Speech is very difficult to understand.  This is somewhat garbled.  Content does seem situationally appropriate.  At this time, have reviewed this with neurology and patient is not a candidate for TPA or thrombectomy.  Neurology will determine anticoagulation related to acute stroke.  Patient does have A. fib with RVR.  Will start an infusion of Cardizem for rate control.  I agree with plan of management.   Charlesetta Shanks, MD 08/10/2020 1119

## 2020-08-02 NOTE — Consult Note (Addendum)
Cardiology Consultation:   Patient ID: [name] MRN: [id number]; DOB: [birth year]  Admit date: 08/29/2020 Date of Consult: 08/19/2020  Primary Care Provider: Binnie Rail, MD [facility] Cardiologist: Mertie Moores, MD  [place] Electrophysiologist:  None    Patient Profile:   Tamara Gregory is a 86 y.o. 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 Johnney Killian.  History of Present Illness:   [name] is followed by Dr. Acie Fredrickson for the above cardiac issues. Stress echo in 2015 showed EF >55%, mild AS/AR, mild MR.TR. Patient was admitted in 03/2019 for hip fracture and cards was consulted for pre-op eval. Although she was high risk no further imaging was obtained. She was seen in hospital follow-up after the hip replacement 04/2019 for dyspnea. Echo was obtained which showed reduced EF of 35-40%, severe hypokinesis of the LV, mid-apical anteroseptal wall and ant wall, mildly dilated LA, severe thickening of aortic valve and severe calcification and stenosis, mild dilation of the ascending aorta measuring 65mm. She was started on lasix and seen back for follow-up where Dr. Acie Fredrickson estimated EF 20-25% with mod to severe AS. he suspected the reduced EF was from AS however did not feel the patient would benefit from TAVR. It was also possible patient had MI at  the time of the hip replacement although patient denies any chest pain. The patient was last seen 05/09/20 in regular follow-up. No cardiac complaints at that time. Lasix was continued.  She has a history of cecum cancer and recent CT showed possible metastatic lung disease. She is being seen by oncologist regularly. Apparently has had progressive functional decline over the last few months. She is wheelchair bound and on 3L O2 at baseline.   The patient presented to the ER 08/10/2020 for code stroke. Her and her husband went to [facility] the day before and husband reports she was at her baseline cognitive status last night. He says the patient normally sleep a lot during the day and wakes up at night and wants to get up. This morning around 7AM he was trying to get her up but she was too weak to push herself up. Said her speech was normal but unusually weak on the left side. When this continued he called his son and eventually called 79. EMS was called who reported she was in afib RVR.   In the ED BP 134/89. She was tachycardic. EKG showed Afib RVR. Labs showed WBC 19.6, Hgb 9.9, glucose 154, creatinine 1.07, BUN 24, albumin 3.2, PT 15.3, INR 1.3. She was given aspirin 324mg , plavix 75mg . CT cerebral perfusion showed acute stroke. Neurology consulted and said patient was outside windor for TPA and not thrombectomy candidate. Patient was admitted to the hospitalist team for further work-up.   On my interview patient appears partially sedated, likely from pain meds. Able to open eyes and minimally responsive.  She is unable to answer my questions so history obtained from husband. Husband said she was talking normally prior to that. He denies known chest pain, fever, chills, palpitations. He said for the last couple months when checking her O2 he noticed elevated heart rates over 100 that would sometimes improve. Also patient was having worsening LLE and torsemide20mg  was increased to BID 4-5 days ago per Dr.  Acie Fredrickson and LLE was improving. Patient follows low salt diet. Patient had an apt to be seen tomorrow in the cardiology office.   Past Medical History:  Diagnosis Date  . Anemia   . Anxiety   . Arthritis   . Cancer (HCC)    hx of skin cancer , colon   . Cataract    removed both eyes   . Chronic back pain    okay with sitting, increased back pain with activity   . Depression   . GERD (gastroesophageal reflux disease)   . Glaucoma   . Headache(784.0)    hx of  . History of kidney stones   . History of migraines   . Hyperlipidemia   . Hypertension   . Macular degeneration   . Myasthenia gravis ([place])   . Occasional tremors   . Pneumonia   . Spinal stenosis   . Thoracic aneurysm    4.4cm; see CT done 01/28/12 and 01/22/13 in [facility].   [name] Uses hearing aid   . Vitamin D deficiency     Past Surgical History:  Procedure Laterality Date  . ABDOMINAL HYSTERECTOMY    . CHOLECYSTECTOMY    . COLON SURGERY     Right colectomy dr. gross 03-17-18  . HIP ARTHROPLASTY Left 03/11/2019   Procedure: ARTHROPLASTY BIPOLAR HIP (HEMIARTHROPLASTY);  Surgeon: Renette Butters, MD;  Location: [place];  Service: Orthopedics;  Laterality: Left;  . LUMBAR LAMINECTOMY/DECOMPRESSION MICRODISCECTOMY N/A 02/04/2013   Procedure: CENTRAL DECOMPRESSION/LUMBAR LAMINECTOMY L3-L4 AND L4-L5 2 LEVELS;  Surgeon: Tobi Bastos, MD;  Location: [facility];  Service: Orthopedics;  Laterality: N/A;  . SPINAL CORD STIMULATOR IMPLANT     in right hip but not currently using because it did not help  . TONSILLECTOMY    . UPPER GASTROINTESTINAL ENDOSCOPY    . US ECHOCARDIOGRAPHY  05/18/2007   EF 55-60%     Home Medications:  Prior to Admission medications   Medication Sig Start Date End Date Taking? Authorizing Provider  acetaminophen (TYLENOL) 325 MG tablet Take 2 tablets (650 mg total) by mouth every 6 (six) hours as needed for headache. 03/29/19   Angiulli, Lavon Paganini, PA-C  aspirin 81 MG chewable tablet Chew 0.5 tablets (40.5 mg  total) by mouth daily. 03/20/18   Stark Klein, MD  brimonidine (ALPHAGAN) 0.2 % ophthalmic solution Place 1 drop into both eyes 3 (three) times daily.     [provider]  cephALEXin (KEFLEX) 500 MG capsule Take 1 capsule (500 mg total) by mouth 3 (three) times daily. 07/18/20   Marrian Salvage, FNP  Cholecalciferol (VITAMIN D) 125 MCG (5000 UT) CAPS Take 5,000 Units by mouth daily.     [provider]  fluticasone (FLONASE) 50 MCG/ACT nasal spray Place 1 spray into both nostrils at bedtime.    [provider]  latanoprost (XALATAN) 0.005 % ophthalmic solution Place 1 drop into the left eye at bedtime. 04/13/15   [provider]  levothyroxine (SYNTHROID) 25 MCG tablet Take 1 tablet by mouth daily before breakfast. 04/03/20   Quay Burow, Claudina Lick, MD  loperamide (IMODIUM) 2 MG capsule Take 2 mg by mouth at bedtime. 06/09/20   [provider]  losartan (COZAAR) 25 MG tablet Take 25 mg by mouth daily. 06/09/20   [provider]  mirtazapine (REMERON) 7.5 MG tablet Take 7.5 mg by mouth at bedtime. 06/09/20   [provider]  Multiple Vitamins-Minerals (PRESERVISION AREDS) CAPS Take 1 capsule by mouth 2 (two) times daily.     [provider]  Omega-3 Fatty Acids (FISH OIL) 435 MG CAPS Take 435 mg by mouth daily.    [provider]  omeprazole (PRILOSEC) 40 MG capsule Take 80 mg by mouth daily.    [provider]  ondansetron (ZOFRAN) 4 MG tablet Take 1 tablet every morning, may take second dose daily if needed 07/13/20   Esterwood, Amy S, PA-C  potassium chloride SA (KLOR-CON) 20 MEQ tablet Take 20 mEq by mouth daily.    [provider]  predniSONE (DELTASONE) 5 MG tablet Take 10 mg by mouth daily with breakfast.    [provider]  rosuvastatin (CRESTOR) 10 MG tablet Take 1 tablet (10 mg total) by mouth daily. 11/19/19 11/13/20  Nahser, Wonda Cheng, MD  sertraline (ZOLOFT) 100 MG tablet Take 1 tablet by  mouth daily. 03/28/20   [provider]  torsemide (DEMADEX) 20 MG tablet Take 1 tablet (20 mg total) by mouth 2 (two) times daily. 07/25/20   Nahser, Wonda Cheng, MD  traZODone (DESYREL) 50 MG tablet Take 1-2 tablets (50-100 mg total) by mouth at bedtime as needed for sleep. 06/06/20   Binnie Rail, MD    Inpatient Medications: Scheduled Meds: . aspirin  324 mg Oral Daily  . atorvastatin  80 mg Oral Daily  . clopidogrel  75 mg Oral Daily  . digoxin  0.25 mg Intravenous Q6H   Continuous Infusions:  PRN Meds:   Allergies:    Allergies  Allergen Reactions  . Tramadol Other (See Comments)    delirium    Social History:   Social History   Socioeconomic History  . Marital status: Married    Spouse name: Ray  . Number of children: 2  . Years of education: Not on file  . Highest education level: High school graduate  Occupational History  . Occupation: retired  Tobacco Use  . Smoking status: Never Smoker  . Smokeless tobacco: Never Used  Vaping Use  . Vaping Use: Never used  Substance and Sexual Activity  . Alcohol use: No  . Drug use: No  . Sexual activity: Not Currently  Other Topics Concern  . Not on file  Social History Narrative   Lives with husband in a one story home.  Has 2 sons.  Retired.  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  Education: college.  [employer].       Right handed    Social Determinants of Health   Financial Resource Strain:   . Difficulty of Paying Living Expenses: Not on file  Food Insecurity:   . Worried About [name] fundraiser in the Last Year: Not on file  . Ran Out of Food in the Last Year: Not on file  Transportation Needs:   . Lack of Transportation (Medical): Not on file  . Lack of Transportation (Non-Medical): Not on file  Physical Activity:   . Days of Exercise per Week: Not on file  . Minutes of Exercise per Session: Not on file  Stress:   . Feeling of Stress :  Not on file  Social  Connections:   . Frequency of Communication with Friends and Family: Not on file  . Frequency of Social Gatherings with Friends and Family: Not on file  . Attends Religious Services: Not on file  . Active Member of Clubs or Organizations: Not on file  . Attends Archivist Meetings: Not on file  . Marital Status: Not on file  Intimate Partner Violence:   . Fear of Current or Ex-Partner: Not on file  . Emotionally Abused: Not on file  . Physically Abused: Not on file  . Sexually Abused: Not on file    Family History:   Family History  Problem Relation Age of Onset  . Heart attack Father   . Diabetes Father   . Cancer Sister   . Breast cancer Sister   . Cancer Brother   . Lung cancer Brother   . Cancer Sister   . Liver cancer Sister   . Diabetes Sister   . Healthy Son   . Colon polyps Neg Hx   . Colon cancer Neg Hx   . Esophageal cancer Neg Hx   . Rectal cancer Neg Hx   . Stomach cancer Neg Hx   . Adrenal disorder Neg Hx      ROS:  Please see the history of present illness.  All other ROS reviewed and negative.     Physical Exam/Data:   Vitals:   08/15/2020 0920 08/04/2020 1211  BP: 134/89   Weight:  68 kg  Height:  5\' 2"  (1.575 m)   No intake or output data in the 24 hours ending 08/24/2020 1309 Last 3 Weights 08/22/2020 07/27/2020 07/18/2020  Weight (lbs) 150 lb 145 lb 9.6 oz 153 lb  Weight (kg) 68.04 kg 66.044 kg 69.4 kg  Some encounter information is confidential and restricted. Go to Review Flowsheets activity to see all data.     Body mass index is 27.44 kg/m.  General:  Frail elderly [employer], very sleepy but arousable HEENT: normal Lymph: no adenopathy Neck: + JVD Endocrine:  No thryomegaly Vascular: No carotid bruits; FA pulses 2+ bilaterally without bruits  Cardiac:  normal S1, S2; Irreg Irreg, tachycardic; + murmur  Lungs:  crackles at bases Abd: soft, nontender, no hepatomegaly  Ext: 2+ B/L edema Musculoskeletal:  No deformities, BUE and BLE strength  normal and equal Skin: warm and dry  Neuro:  CNs 2-12 intact, no focal abnormalities noted Psych:  Normal affect   EKG:  The EKG was personally reviewed and demonstrates:  Afib RVR, 122bpm, Qtc 458ms Telemetry:  Telemetry was personally reviewed and demonstrates:  Afib RVR, rates 100-120s  Relevant CV Studies:  Echo 05/2019 1. Apical left ventricular aneurysm.  2. Large apical myocardial infarction.  3. Severe hypokinesis of the left ventricular, mid-apical anteroseptal  wall and anterior wall.  4. The left ventricle has moderately reduced systolic function, with an  ejection fraction of 35-40%. The cavity size was normal. Left ventricular  diastolic Doppler parameters are consistent with impaired relaxation.  5. The right ventricle has normal systolic function. The cavity was  normal. There is no increase in right ventricular wall thickness. Right  ventricular systolic pressure is mildly elevated with an estimated  pressure of 35.5 mmHg.  6. Left atrial size was mildly dilated.  7. The aortic valve is tricuspid. Severely thickening of the aortic  valve. Severe calcification of the aortic valve. Aortic valve  regurgitation is trivial by color flow Doppler. Severe stenosis of the  aortic valve.  8. Aortic stenosis gradients are lower than expected due to reduced LV  systolic function.  9. There is mild dilatation of the ascending aorta measuring 44 mm.  10. No intracardiac thrombi or masses were visualized.  11. When compared to the prior study: compared to 2015 (report from  [facility]), there is a new left ventricular apical aneurysm, LVEF  is substantially lower and there is now severe AS.    Laboratory Data:  High Sensitivity Troponin:  No results for input(s): TROPONINIHS in the last 720 hours.   Chemistry Recent Labs  Lab 07/27/20 0825 08/04/2020 0918 08/15/2020 0919  NA 140 136 137  K 3.4* 4.5 4.1  CL 90* 91* 90*  CO2 40*  --  32  GLUCOSE 205* 156* 154*   BUN 20 34* 24*  CREATININE 0.99 0.90 1.07*  CALCIUM 9.9  --  9.1  GFRNONAA 49*  --  45*  GFRAA 57*  --  52*  ANIONGAP 10  --  15    Recent Labs  Lab 07/27/20 0825 08/29/2020 0919  PROT 6.6 6.7  ALBUMIN 3.2* 3.2*  AST 15 37  ALT 20 33  ALKPHOS 98 83  BILITOT 0.7 1.3*   Hematology Recent Labs  Lab 07/27/20 0825 08/29/2020 0918 08/22/2020 0919  WBC 14.8*  --  19.6*  RBC 3.42*  3.45*  --  3.54*  HGB 9.8* 11.2* 9.9*  HCT 32.7* 33.0* 33.7*  MCV 95.6  --  95.2  MCH 28.7  --  28.0  MCHC 30.0  --  29.4*  RDW 15.7*  --  15.9*  PLT 278  --  273   BNPNo results for input(s): BNP, PROBNP in the last 168 hours.  DDimer No results for input(s): DDIMER in the last 168 hours.   Radiology/Studies:  CT CEREBRAL PERFUSION W CONTRAST  Result Date: 08/27/2020 CLINICAL DATA:  Stroke.  Left-sided weakness and slurred speech EXAM: CT ANGIOGRAPHY HEAD AND NECK CT PERFUSION BRAIN TECHNIQUE: Multidetector CT imaging of the head and neck was performed using the standard protocol during bolus administration of intravenous contrast. Multiplanar CT image reconstructions and MIPs were obtained to evaluate the vascular anatomy. Carotid stenosis measurements (when applicable) are obtained utilizing NASCET criteria, using the distal internal carotid diameter as the denominator. Multiphase CT imaging of the brain was performed following IV bolus contrast injection. Subsequent parametric perfusion maps were calculated using RAPID software. CONTRAST:  198mL OMNIPAQUE IOHEXOL 350 MG/ML SOLN COMPARISON:  CT head 08/23/2020 FINDINGS: CTA NECK FINDINGS Aortic arch: Atherosclerotic calcification throughout the aortic arch. Ascending aorta measures 42 mm in diameter. Descending thoracic aorta 26 mm in diameter. Negative for dissection. Eccentric plaque or thrombus of the distal arch measuring approximately 2 cm. This is distal to the left subclavian artery. Proximal great vessels patent without significant stenosis. Mild  atherosclerotic disease at the origin the left common carotid and left subclavian artery. Right carotid system: Atherosclerotic calcification right carotid bifurcation. Less than 50% diameter stenosis proximal right internal carotid artery. Left carotid system: Atherosclerotic calcification in the carotid bulb. Adjacent to the calcification, there is a low-density filling defect in the lumen attached to the wall. This could be thrombus or soft plaque. Less than 50% diameter stenosis proximal left internal carotid artery. Kinking of the left internal carotid artery just above the plaque. Vertebral arteries: Both vertebral arteries patent to the basilar without stenosis. Skeleton: Anterolisthesis C3-4 and C4-5. 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 Disc degeneration and spurring C6-7. 4 mm anterolisthesis C7-T1 which  appears degenerative. No acute skeletal abnormality. Other neck: Negative for mass or adenopathy. Upper chest: Widespread patchy small areas of infiltrate throughout the lungs bilaterally. Small bilateral pleural effusions. Review of the MIP images confirms the above findings CTA HEAD FINDINGS Anterior circulation: Occlusion right M1 segment distally. There is flow in the anterior and superior branches of the right middle cerebral artery due to collaterals with decreased opacification compared to the contralateral side. Both anterior cerebral arteries widely patent. Left middle cerebral artery widely patent without stenosis. Posterior circulation: Both vertebral arteries patent to the basilar. PICA patent on the right but not left. AICA, superior cerebellar, posterior cerebral arteries patent bilaterally without stenosis. Basilar patent without stenosis. Patent posterior communicating artery on the left. Venous sinuses: Normal venous enhancement. Anatomic variants: None Review of the MIP images confirms the above findings CT Brain Perfusion Findings: ASPECTS: 10 CBF (<30%) Volume: 87mL Perfusion (Tmax>6.0s) volume: 24mL  Mismatch Volume: 24mL Infarction Location:Right MCA territory involving the temporal and parietal lobe. IMPRESSION: 1. CT perfusion positive for 98 mL ischemia in the right MCA territory involving the temporal and parietal lobe. No core infarct on CT perfusion. 2. Acute occlusion right distal M1 segment. There is collateral flow in the right superior and inferior division of the MCA. 3. Less than 50% diameter stenosis proximal right internal carotid artery due to calcific plaque 4. Atherosclerotic calcification left carotid bulb. Associated intraluminal filling defect which may be thrombus or soft plaque. Less than 50% diameter stenosis proximal left internal carotid artery. 5. Both vertebral arteries patent to the basilar 6. Aortic Atherosclerosis (ICD10-I70.0). Aortic aneurysm. Ascending aorta 42 mm in diameter. 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 Recommend annual imaging followup by CTA or MRA. This recommendation follows 2010 ACCF/AHA/AATS/ACR/ASA/SCA/SCAI/SIR/STS/SVM Guidelines for the Diagnosis and Management of Patients with Thoracic Aortic Disease. Circulation. 2010; 121: J009-F818. Aortic aneurysm NOS (ICD10-I71.9) 7. These results were called by telephone at the time of interpretation on 08/13/2020 at 9:54 am to provider [facility] , who verbally acknowledged these results. 8. Extensive patchy airspace disease bilaterally. Correlate with COVID testing. Small pleural effusions. Electronically Signed   By: Franchot Gallo M.D.   On: 08/07/2020 09:56   DG Chest Portable 1 View  Result Date: 08/27/2020 CLINICAL DATA:  Shortness of breath, leukocytosis EXAM: PORTABLE CHEST 1 VIEW COMPARISON:  03/27/2020 FINDINGS: Stable cardiomegaly. Known thoracic aortic aneurysm with extensive atherosclerosis. Patchy airspace opacity at the left lung base with small left pleural effusion. There are prominent interstitial opacities throughout both lungs. No  pneumothorax. Spinal stimulator leads within the thoracic spine are again noted. IMPRESSION: 1. Patchy airspace opacity at the left lung base with small left pleural effusion, concerning for pneumonia. 2. Prominent interstitial opacities throughout both lungs may represent edema versus atypical/viral infection. Electronically Signed   By: Davina Poke D.O.   On: 08/17/2020 10:48   CT HEAD CODE STROKE WO CONTRAST  Result Date: 08/17/2020 CLINICAL DATA:  Code stroke.  Flaccid left side EXAM: CT HEAD WITHOUT CONTRAST TECHNIQUE: Contiguous axial images were obtained from the base of the skull through the vertex without intravenous contrast. COMPARISON:  03/10/2019 FINDINGS: Brain: No evidence of acute infarction, hemorrhage, hydrocephalus, extra-axial collection or mass lesion/mass effect. Atrophy and chronic small vessel ischemia in keeping with age. Vascular: No hyperdense vessel or unexpected calcification. Skull: Normal. Negative for fracture or focal lesion. Sinuses/Orbits: Bilateral cataract resection Other: These results were communicated to [facility] at 9:23 amon 9/1/2021by text page via the  AMION messaging system. ASPECTS [facility] Stroke Program Early CT Score) - Ganglionic level infarction (caudate, lentiform nuclei, internal capsule, insula, M1-M3 cortex): 7 - Supraganglionic infarction (M4-M6 cortex): 3 Total score (0-10 with 10 being normal): 10 IMPRESSION: 1. Aged brain without acute finding. 2. ASPECTS is 10.  No visible thalamic infarct. Electronically Signed   By: Monte Fantasia M.D.   On: 08/16/2020 09:25   CT ANGIO HEAD CODE STROKE  Result Date: 08/06/2020 CLINICAL DATA:  Stroke.  Left-sided weakness and slurred speech EXAM: CT ANGIOGRAPHY HEAD AND NECK CT PERFUSION BRAIN TECHNIQUE: Multidetector CT imaging of the head and neck was performed using the standard protocol during bolus administration of intravenous contrast. Multiplanar CT image reconstructions and MIPs were obtained to  evaluate the vascular anatomy. Carotid stenosis measurements (when applicable) are obtained utilizing NASCET criteria, using the distal internal carotid diameter as the denominator. Multiphase CT imaging of the brain was performed following IV bolus contrast injection. Subsequent parametric perfusion maps were calculated using RAPID software. CONTRAST:  15mL OMNIPAQUE IOHEXOL 350 MG/ML SOLN COMPARISON:  CT head 08/22/2020 FINDINGS: CTA NECK FINDINGS Aortic arch: Atherosclerotic calcification throughout the aortic arch. Ascending aorta measures 42 mm in diameter. Descending thoracic aorta 26 mm in diameter. Negative for dissection. Eccentric plaque or thrombus of the distal arch measuring approximately 2 cm. This is distal to the left subclavian artery. Proximal great vessels patent without significant stenosis. Mild atherosclerotic disease at the origin the left common carotid and left subclavian artery. Right carotid system: Atherosclerotic calcification right carotid bifurcation. Less than 50% diameter stenosis proximal right internal carotid artery. Left carotid system: Atherosclerotic calcification in the carotid bulb. Adjacent to the calcification, there is a low-density filling defect in the lumen attached to the wall. This could be thrombus or soft plaque. Less than 50% diameter stenosis proximal left internal carotid artery. Kinking of the left internal carotid artery just above the plaque. Vertebral arteries: Both vertebral arteries patent to the basilar without stenosis. Skeleton: Anterolisthesis C3-4 and C4-5. 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 Disc degeneration and spurring C6-7. 4 mm anterolisthesis C7-T1 which appears degenerative. No acute skeletal abnormality. Other neck: Negative for mass or adenopathy. Upper chest: Widespread patchy small areas of infiltrate throughout the lungs bilaterally. Small bilateral pleural effusions. Review of the MIP images confirms the above findings CTA HEAD FINDINGS Anterior  circulation: Occlusion right M1 segment distally. There is flow in the anterior and superior branches of the right middle cerebral artery due to collaterals with decreased opacification compared to the contralateral side. Both anterior cerebral arteries widely patent. Left middle cerebral artery widely patent without stenosis. Posterior circulation: Both vertebral arteries patent to the basilar. PICA patent on the right but not left. AICA, superior cerebellar, posterior cerebral arteries patent bilaterally without stenosis. Basilar patent without stenosis. Patent posterior communicating artery on the left. Venous sinuses: Normal venous enhancement. Anatomic variants: None Review of the MIP images confirms the above findings CT Brain Perfusion Findings: ASPECTS: 10 CBF (<30%) Volume: 89mL Perfusion (Tmax>6.0s) volume: 88mL Mismatch Volume: 7mL Infarction Location:Right MCA territory involving the temporal and parietal lobe. IMPRESSION: 1. CT perfusion positive for 98 mL ischemia in the right MCA territory involving the temporal and parietal lobe. No core infarct on CT perfusion. 2. Acute occlusion right distal M1 segment. There is collateral flow in the right superior and inferior division of the MCA. 3. Less than 50% diameter stenosis proximal right internal carotid artery due to calcific plaque 4. Atherosclerotic calcification left carotid bulb. Associated intraluminal filling defect  which may be thrombus or soft plaque. Less than 50% diameter stenosis proximal left internal carotid artery. 5. Both vertebral arteries patent to the basilar 6. Aortic Atherosclerosis (ICD10-I70.0). Aortic aneurysm. Ascending aorta 42 mm in diameter. 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 Recommend annual imaging followup by CTA or MRA. This recommendation follows 2010 ACCF/AHA/AATS/ACR/ASA/SCA/SCAI/SIR/STS/SVM Guidelines for the Diagnosis and Management of Patients with Thoracic Aortic  Disease. Circulation. 2010; 121: H371-I967. Aortic aneurysm NOS (ICD10-I71.9) 7. These results were called by telephone at the time of interpretation on 08/11/2020 at 9:54 am to provider [facility] , who verbally acknowledged these results. 8. Extensive patchy airspace disease bilaterally. Correlate with COVID testing. Small pleural effusions. Electronically Signed   By: Franchot Gallo M.D.   On: 08/06/2020 09:56   CT ANGIO NECK CODE STROKE  Result Date: 08/22/2020 CLINICAL DATA:  Stroke.  Left-sided weakness and slurred speech EXAM: CT ANGIOGRAPHY HEAD AND NECK CT PERFUSION BRAIN TECHNIQUE: Multidetector CT imaging of the head and neck was performed using the standard protocol during bolus administration of intravenous contrast. Multiplanar CT image reconstructions and MIPs were obtained to evaluate the vascular anatomy. Carotid stenosis measurements (when applicable) are obtained utilizing NASCET criteria, using the distal internal carotid diameter as the denominator. Multiphase CT imaging of the brain was performed following IV bolus contrast injection. Subsequent parametric perfusion maps were calculated using RAPID software. CONTRAST:  169mL OMNIPAQUE IOHEXOL 350 MG/ML SOLN COMPARISON:  CT head 08/29/2020 FINDINGS: CTA NECK FINDINGS Aortic arch: Atherosclerotic calcification throughout the aortic arch. Ascending aorta measures 42 mm in diameter. Descending thoracic aorta 26 mm in diameter. Negative for dissection. Eccentric plaque or thrombus of the distal arch measuring approximately 2 cm. This is distal to the left subclavian artery. Proximal great vessels patent without significant stenosis. Mild atherosclerotic disease at the origin the left common carotid and left subclavian artery. Right carotid system: Atherosclerotic calcification right carotid bifurcation. Less than 50% diameter stenosis proximal right internal carotid artery. Left carotid system: Atherosclerotic calcification in the carotid  bulb. Adjacent to the calcification, there is a low-density filling defect in the lumen attached to the wall. This could be thrombus or soft plaque. Less than 50% diameter stenosis proximal left internal carotid artery. Kinking of the left internal carotid artery just above the plaque. Vertebral arteries: Both vertebral arteries patent to the basilar without stenosis. Skeleton: Anterolisthesis C3-4 and C4-5. 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 Disc degeneration and spurring C6-7. 4 mm anterolisthesis C7-T1 which appears degenerative. No acute skeletal abnormality. Other neck: Negative for mass or adenopathy. Upper chest: Widespread patchy small areas of infiltrate throughout the lungs bilaterally. Small bilateral pleural effusions. Review of the MIP images confirms the above findings CTA HEAD FINDINGS Anterior circulation: Occlusion right M1 segment distally. There is flow in the anterior and superior branches of the right middle cerebral artery due to collaterals with decreased opacification compared to the contralateral side. Both anterior cerebral arteries widely patent. Left middle cerebral artery widely patent without stenosis. Posterior circulation: Both vertebral arteries patent to the basilar. PICA patent on the right but not left. AICA, superior cerebellar, posterior cerebral arteries patent bilaterally without stenosis. Basilar patent without stenosis. Patent posterior communicating artery on the left. Venous sinuses: Normal venous enhancement. Anatomic variants: None Review of the MIP images confirms the above findings CT Brain Perfusion Findings: ASPECTS: 10 CBF (<30%) Volume: 31mL Perfusion (Tmax>6.0s) volume: 80mL Mismatch Volume: 81mL Infarction Location:Right MCA territory involving the temporal and parietal lobe. IMPRESSION: 1. CT  perfusion positive for 98 mL ischemia in the right MCA territory involving the temporal and parietal lobe. No core infarct on CT perfusion. 2. Acute occlusion right distal M1 segment.  There is collateral flow in the right superior and inferior division of the MCA. 3. Less than 50% diameter stenosis proximal right internal carotid artery due to calcific plaque 4. Atherosclerotic calcification left carotid bulb. Associated intraluminal filling defect which may be thrombus or soft plaque. Less than 50% diameter stenosis proximal left internal carotid artery. 5. Both vertebral arteries patent to the basilar 6. Aortic Atherosclerosis (ICD10-I70.0). Aortic aneurysm. Ascending aorta 42 mm in diameter. 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 Recommend annual imaging followup by CTA or MRA. This recommendation follows 2010 ACCF/AHA/AATS/ACR/ASA/SCA/SCAI/SIR/STS/SVM Guidelines for the Diagnosis and Management of Patients with Thoracic Aortic Disease. Circulation. 2010; 121: Q947-M546. Aortic aneurysm NOS (ICD10-I71.9) 7. These results were called by telephone at the time of interpretation on 08/27/2020 at 9:54 am to provider [facility] , who verbally acknowledged these results. 8. Extensive patchy airspace disease bilaterally. Correlate with COVID testing. Small pleural effusions. Electronically Signed   By: Franchot Gallo M.D.   On: 08/06/2020 09:56     Assessment and Plan:   New onset afib - Found in the setting of acute stroke. Husband reported high HR for the last few months. Prior to this not on any blood thinners. Also has history of cancer increase hypercoagulable state.  - EKG with Afib RVR  - CHADSVASC at least 7 ( strokex2, HTN, female, agex2, CAD) Neuro hesitant to start anticoagulation due to high risk of hemorrhagic conversion, however started on aspirin and plavix per neurology. - Last echo showed decreased EF 35-40% with mod to severe AS. Repeat echo ordered - Initially started on IV cardizem but pressures dropped and patient was started don digoxin load.  - Notes suggest she had previous bradycardia on metoprolol.  - check  TSH. Keep K>4 and Mag >2 - Heart rates 100-120, seem to be slowly improving. Might need to just rate control for now however difficult with soft pressures. Unsure if amio is a possibility, would not want to convert if she is not anticoagulated. Will discuss a/c with MD. Continue with digoxin for now.   Acute stroke - CTH with no acute stroke. CTA with R MCA distal M1 occlusion. Not a candidate for tPA or thrombectomy.  - Brain MRI and vascular imaging ordered - Atorvastatin - Echo ordered - Aspirin and plavix - per nuerology   Acute on Chronic combined CHF  - EF last year down to 35-40% - Pt had recent worsening LLE, wonder if this was 2/2 to afib. Dr. Acie Fredrickson increased torsemide 20 mg to BID and husband reports legs have been improving - Diuretics held today for soft pressures. - Would restart diuretics when able - Repeat echo as above - Will check a BNP  Mod to severe AS - repeat echo - prior office notes state no plan for TAVR  HTN - pressures dropped with cardizem - pta losartan 25 mg daily  CAD - suspected prior anterior MI last year, esp with new low EF at the time. Patient had no chest pain at the time and no ischemic eval pursued, might be due to the fact she has low functionality at baseline - pta aspirin, rosuvastatin - history of bradycardia on BB - Echo ordered  Leukocytosis - possible PNA on CXR - could also be from cellulitis - UA ordered -  further work-up per IM  Goals of care - plan for palliative care consultation  For questions or updates, please contact [place] Please consult [email or web address] for contact info under    Signed, Derric Dealmeida Ninfa Meeker, PA-C  08/22/2020 1:09 PM

## 2020-08-02 NOTE — Consult Note (Addendum)
NEUROLOGY CONSULTATION NOTE   Date of service: August 02, 2020 Patient Name: Tamara Gregory MRN:  [id number] DOB:  [birth year] Reason for consult: "stroke code"  History of Present Illness  Tamara Gregory is a 86 y.o. female with PMH significant for Anemia, colon cancer s/p R colectomy, GERD, HLD, HTN, Migraine, Myasthenia, venous stasis dermatitis who presents with acute onset Left sided weakness. Husband reports that she went to bed at 2200 on 08/01/20 and in the AM, was trying to wake him up and wanting to get out of bed. She is bed and wheel chair bound at baseline and needs helkp with transfer. He got out of bed at 7am to get her to move but noted that the patient could not sit up. She appeared off. He called EMS and she was noted to be leaning to her left side. She was brought in as a stroke code. She was noted to have afibb on rhythm strip during transport per EMS.  Husband reports that she has been wheelkchair bound for 2+ years now after she broke her hip. She needs help with getting her out of the bed and needs help with all ADLs including cleaning up after bowel movements, dressing and undressing.  She has no prior hx of stroke. No family hx. She takes aspirin 81mg  daily, no hx of abnormal heart rhythm.  Workup with CTH with no acute ICH and ASPECTs of 10, CTA with R MCA distal M1 occlusion. In addition she also has a L carotid bulb atherosclerosis with a intraluminal filling defect concerning for a thrombus vs soft plaque.  I discussed the findings with her husband Tamara Gregory over the phone. Discussed that she is out of tPA window. She is not a candidate for thromectomy 2/2 her poor functioning baseline with her being wheel chair and bed bound and requiring significant assistance.  I also mentioned the difficult situation that she is in. She needs anticoagulation for the noted concern for L ICA thrombus and potential risk of L MCA stroke but she is going to be very high risk for an  intracerebral hemorrhage in the setting of her R MCA stroke. Discussed that there is no right way to approach this difficult situation.  NIHSS: 18 MRS: 4+ TPA: outside the window. LKW 2200 on 08/01/20. Thrombectomy: not offered 2/2 poor functional baseline status.   ROS   Unable to obtain due to the acuity of the situation.  Past History   Past Medical History:  Diagnosis Date  . Anemia   . Anxiety   . Arthritis   . Cancer (HCC)    hx of skin cancer , colon   . Cataract    removed both eyes   . Chronic back pain    okay with sitting, increased back pain with activity   . Depression   . GERD (gastroesophageal reflux disease)   . Glaucoma   . Headache(784.0)    hx of  . History of kidney stones   . History of migraines   . Hyperlipidemia   . Hypertension   . Macular degeneration   . Myasthenia gravis ([place])   . Occasional tremors   . Pneumonia   . Spinal stenosis   . Thoracic aneurysm    4.4cm; see CT done 01/28/12 and 01/22/13 in [facility].   [name] Uses hearing aid   . Vitamin D deficiency    Past Surgical History:  Procedure Laterality Date  . ABDOMINAL HYSTERECTOMY    . CHOLECYSTECTOMY    .  COLON SURGERY     Right colectomy dr. gross 03-17-18  . HIP ARTHROPLASTY Left 03/11/2019   Procedure: ARTHROPLASTY BIPOLAR HIP (HEMIARTHROPLASTY);  Surgeon: Renette Butters, MD;  Location: [place];  Service: Orthopedics;  Laterality: Left;  . LUMBAR LAMINECTOMY/DECOMPRESSION MICRODISCECTOMY N/A 02/04/2013   Procedure: CENTRAL DECOMPRESSION/LUMBAR LAMINECTOMY L3-L4 AND L4-L5 2 LEVELS;  Surgeon: Tobi Bastos, MD;  Location: [facility];  Service: Orthopedics;  Laterality: N/A;  . SPINAL CORD STIMULATOR IMPLANT     in right hip but not currently using because it did not help  . TONSILLECTOMY    . UPPER GASTROINTESTINAL ENDOSCOPY    . US ECHOCARDIOGRAPHY  05/18/2007   EF 55-60%   Family History  Problem Relation Age of Onset  . Heart attack Father   . Diabetes Father   . Cancer Sister    . Breast cancer Sister   . Cancer Brother   . Lung cancer Brother   . Cancer Sister   . Liver cancer Sister   . Diabetes Sister   . Healthy Son   . Colon polyps Neg Hx   . Colon cancer Neg Hx   . Esophageal cancer Neg Hx   . Rectal cancer Neg Hx   . Stomach cancer Neg Hx   . Adrenal disorder Neg Hx    Social History   Socioeconomic History  . Marital status: Married    Spouse name: Ray  . Number of children: 2  . Years of education: Not on file  . Highest education level: High school graduate  Occupational History  . Occupation: retired  Tobacco Use  . Smoking status: Never Smoker  . Smokeless tobacco: Never Used  Vaping Use  . Vaping Use: Never used  Substance and Sexual Activity  . Alcohol use: No  . Drug use: No  . Sexual activity: Not Currently  Other Topics Concern  . Not on file  Social History Narrative   Lives with husband in a one story home.  Has 2 sons.  Retired.  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  Education: college.  [employer].       Right handed    Social Determinants of Health   Financial Resource Strain:   . Difficulty of Paying Living Expenses: Not on file  Food Insecurity:   . Worried About [name] fundraiser in the Last Year: Not on file  . Ran Out of Food in the Last Year: Not on file  Transportation Needs:   . Lack of Transportation (Medical): Not on file  . Lack of Transportation (Non-Medical): Not on file  Physical Activity:   . Days of Exercise per Week: Not on file  . Minutes of Exercise per Session: Not on file  Stress:   . Feeling of Stress : Not on file  Social Connections:   . Frequency of Communication with Friends and Family: Not on file  . Frequency of Social Gatherings with Friends and Family: Not on file  . Attends Religious Services: Not on file  . Active Member of Clubs or Organizations: Not on file  . Attends Archivist Meetings: Not on file  . Marital Status: Not on file    Allergies  Allergen Reactions  . Tramadol Other (See Comments)    delirium    Medications  (Not in a hospital admission)    Vitals     There is no height or weight on file to calculate BMI.  Physical Exam  General: Laying in bed; in no acute distress. HENT: Normal oropharynx and mucosa. Normal external appearance of ears and nose. Neck: Supple, no pain or tenderness CV: No JVD. No peripheral edema. Pulmonary: Symmetric Chest rise. Normal respiratory effort. Abdomen: Soft, non-tender Ext: No cyanosis, edema, or deformity  Skin: No rash. Normal palpation of skin.   Musculoskeletal: Normal digits and nails by inspection. No clubbing.  Neurologic Examination  Mental status/Cognition: Alert, eyes open, follows some basic commands. Speech/language: non fluent,dysarthric, comprehension intact to simple commands, garbled speech.  Cranial nerves:   CN II Pupils equal and reactive to light, does not blink to threat on the Left   CN III,IV,VI R gaze deviation that could not be overcome.   CN V Does not respond to touch in the l face.   CN VII L facial droop.   CN VIII normal hearing to speech   CN IX & X    CN XI    CN XII    Motor:  Moves RUE, moves BL lower extremities althou left is a bit less than Right. No movement in LUE.  RUE: 5/5 RLE: 3/5 LLE: 2/5 LUE: 0/5.   Sensation:  Light touch Does not regard touch in LUE and LLE. Does appear to have visuospatial neglect   Pin prick    Temperature    Vibration   Proprioception    Coordination/Complex Motor:  - Finger to Nose unable to assess but does have  - Heel to shin unable to assess - Gait: unable to assess.  Labs   Lab Results  Component Value Date   NA 136 08/07/2020   K 4.5 08/10/2020   CL 91 (L) 08/23/2020   CO2 40 (H) 07/27/2020   GLUCOSE 156 (H) 08/20/2020   BUN 34 (H) 08/20/2020   CREATININE 0.90 08/28/2020   CALCIUM 9.9 07/27/2020   ALBUMIN 3.2 (L) 07/27/2020   AST 15 07/27/2020   ALT 20  07/27/2020   ALKPHOS 98 07/27/2020   BILITOT 0.7 07/27/2020   GFRNONAA 49 (L) 07/27/2020   GFRAA 57 (L) 07/27/2020     Imaging and Diagnostic studies  CTH was negative for a large hypodensity concerning for a large territory infarct or hyperdensity concerning for an ICH. CTA and CTP with R MCA distal M1 occlusion and L carotid bulb thrombus vs soft plaque. CTP with mismatch 33ml in the R MCA territory.  Impression   Tamara Gregory is a 86 y.o. female presenting with R MCA stroke with NIHSS of 18, premorbid mRS of 4 atleast. She was outside tPA window, not a candidate for thrombectomy 2/2 poor baseline and bedridden.  I discussed the findings with her husband Mr. Tamara Gregory over the phone. Discussed that she is out of tPA window of 4.5 hours and therefore not eligible. She is not a candidate for thromectomy 2/2 her poor functioning baseline with her being wheel chair and bed bound and requiring significant assistance.  I also mentioned the difficult situation that she is in. She needs anticoagulation for the noted concern for L ICA thrombus and potential risk of L MCA stroke but she is going to be very high risk for an intracerebral hemorrhage in the setting of her R MCA stroke. Discussed that there is no right way to approach this difficult situation. At this time, I will do dual antiplatelet therapy with Aspirin and plavix to take a middle ground approach. She is high risk for bleeding on heparin or other anticoagulation.  Recommendations   -  Brain imaging- MRI Brain pending. - Vascular imaging- CTA Brain/Neck with distal R M1 occlusion and L Carotid bulb thrombus vs soft plaque. - TTE - pending - Lipid panel - ordered  - Statin - Atorvastatin 80mg  daily - A1C - ordered  - Dual Antiplatelet with Aspirin and plavix - DVT ppx - SQ Heparin - SBP goal - permissive hypertension first 24 h < 220/110. Hold home meds.  - Telemetry monitoring for arrythmia - Swallow screen - Stroke  education - PT/OT/SLP consult - Would recommend having palliative team on board to help [place] clarify goals of care.  ______________________________________________________________________   Thank you for the opportunity to take part in the care of this patient. If you have any further questions, please contact the neurology consultation attending.  Signed,  [place] Pager Number [phone]

## 2020-08-02 NOTE — ED Notes (Signed)
Help get patient cleaned up placed a external cath patient is resting with family at bedside and call bell in reach

## 2020-08-02 NOTE — H&P (Addendum)
History and Physical    Tamara Gregory TGY:[id number] DOB: June 23, 1925 DOA: 08/21/2020  PCP: Binnie Rail, MD (Confirm with patient/family/NH records and if not entered, this has to be entered at [facility] point of entry) Patient coming from: Home  I have personally briefly reviewed patient's old medical records in [place]  Chief Complaint: Weakness  HPI: Tamara Gregory is a 86 y.o. female with medical history significant of chronic systolic CHF LVEF 35 to 28% in 2020, chronic O2 dependent respiratory failure secondary CHF on 2 L as needed, worsening ambulation dysfunction, bedbound for the last 2 to 3 months, colon CA status post partial colectomy, metastatic lung CA, OA on chronic prednisone, accidental finding in 07/2020, hypothyroidism, was brought in by husband for evaluation of worsening of weakness. Patient was given morphine earlier and remained lethargic at the time I saw her, most history was provided by patient husband at bedside. Husband reported that patient had worsening of ambulation function after hip surgery last year, for the 73-month her ambulation dysfunction has significant deteriorated, from roller walker to wheelchair to bedbound, and patient has remained bedbound for last 2 to 45-month and husband has been caring for her at home with no external help.   This morning around 7:00, patient was speaking to her husband "I want to get up" but was so weak and could not get out of bed and leaning toward her left. EMS also reported a facial droop. Husband also reported that patient has had worsening of abdominal pain for which she had a CT scan of abdomen pelvis 3 weeks ago, showing no acute abnormalities in the abdomen but multiple metastatic like lesions in the lungs bilaterally, husband talked to oncologist for the first time recently and set up appointment to see oncologist to decide about treatment plan this week. Patient also had a cellulitis of her chronic leg wound which was  treated for 5 days of Keflex last week. Patient also follows cardiology for CHF, recently patient developed worsening of bilateral extremity edema and cardiology increased her Lasix.  ED Course: Code stroke was called, patient not considered for TPA because of poor overall conditions and high bleeding risk. CT head showed no acute finding but CT angiogram showed acute occlusion of right distal M1 segment. WBC 19.8, CO2 38, Cre 1.0  Review of Systems: Unable to perform, patient lethargic  Past Medical History:  Diagnosis Date  . Anemia   . Anxiety   . Arthritis   . Cancer (HCC)    hx of skin cancer , colon   . Cataract    removed both eyes   . Chronic back pain    okay with sitting, increased back pain with activity   . Depression   . GERD (gastroesophageal reflux disease)   . Glaucoma   . Headache(784.0)    hx of  . History of kidney stones   . History of migraines   . Hyperlipidemia   . Hypertension   . Macular degeneration   . Myasthenia gravis ([place])   . Occasional tremors   . Pneumonia   . Spinal stenosis   . Thoracic aneurysm    4.4cm; see CT done 01/28/12 and 01/22/13 in [facility].   [name] Uses hearing aid   . Vitamin D deficiency     Past Surgical History:  Procedure Laterality Date  . ABDOMINAL HYSTERECTOMY    . CHOLECYSTECTOMY    . COLON SURGERY     Right colectomy dr. gross 03-17-18  .  HIP ARTHROPLASTY Left 03/11/2019   Procedure: ARTHROPLASTY BIPOLAR HIP (HEMIARTHROPLASTY);  Surgeon: Renette Butters, MD;  Location: [facility];  Service: Orthopedics;  Laterality: Left;  . LUMBAR LAMINECTOMY/DECOMPRESSION MICRODISCECTOMY N/A 02/04/2013   Procedure: CENTRAL DECOMPRESSION/LUMBAR LAMINECTOMY L3-L4 AND L4-L5 2 LEVELS;  Surgeon: Tobi Bastos, MD;  Location: [facility];  Service: Orthopedics;  Laterality: N/A;  . SPINAL CORD STIMULATOR IMPLANT     in right hip but not currently using because it did not help  . TONSILLECTOMY    . UPPER GASTROINTESTINAL ENDOSCOPY    . US  ECHOCARDIOGRAPHY  05/18/2007   EF 55-60%     reports that she has never smoked. She has never used smokeless tobacco. She reports that she does not drink alcohol and does not use drugs.  Allergies  Allergen Reactions  . Tramadol Other (See Comments)    delirium    Family History  Problem Relation Age of Onset  . Heart attack Father   . Diabetes Father   . Cancer Sister   . Breast cancer Sister   . Cancer Brother   . Lung cancer Brother   . Cancer Sister   . Liver cancer Sister   . Diabetes Sister   . Healthy Son   . Colon polyps Neg Hx   . Colon cancer Neg Hx   . Esophageal cancer Neg Hx   . Rectal cancer Neg Hx   . Stomach cancer Neg Hx   . Adrenal disorder Neg Hx      Prior to Admission medications   Medication Sig Start Date End Date Taking? Authorizing Provider  acetaminophen (TYLENOL) 325 MG tablet Take 2 tablets (650 mg total) by mouth every 6 (six) hours as needed for headache. 03/29/19   Angiulli, Lavon Paganini, PA-C  aspirin 81 MG chewable tablet Chew 0.5 tablets (40.5 mg total) by mouth daily. 03/20/18   Stark Klein, MD  brimonidine (ALPHAGAN) 0.2 % ophthalmic solution Place 1 drop into both eyes 3 (three) times daily.     [provider]  cephALEXin (KEFLEX) 500 MG capsule Take 1 capsule (500 mg total) by mouth 3 (three) times daily. 07/18/20   Marrian Salvage, FNP  Cholecalciferol (VITAMIN D) 125 MCG (5000 UT) CAPS Take 5,000 Units by mouth daily.     [provider]  fluticasone (FLONASE) 50 MCG/ACT nasal spray Place 1 spray into both nostrils at bedtime.    [provider]  latanoprost (XALATAN) 0.005 % ophthalmic solution Place 1 drop into the left eye at bedtime. 04/13/15   [provider]  levothyroxine (SYNTHROID) 25 MCG tablet Take 1 tablet by mouth daily before breakfast. 04/03/20   Burns, Claudina Lick, MD  loperamide (IMODIUM) 2 MG capsule Take 2 mg by mouth at bedtime. 06/09/20   [provider]  losartan (COZAAR) 25  MG tablet Take 25 mg by mouth daily. 06/09/20   [provider]  mirtazapine (REMERON) 7.5 MG tablet Take 7.5 mg by mouth at bedtime. 06/09/20   [provider]  Multiple Vitamins-Minerals (PRESERVISION AREDS) CAPS Take 1 capsule by mouth 2 (two) times daily.     [provider]  Omega-3 Fatty Acids (FISH OIL) 435 MG CAPS Take 435 mg by mouth daily.    [provider]  omeprazole (PRILOSEC) 40 MG capsule Take 80 mg by mouth daily.    [provider]  ondansetron (ZOFRAN) 4 MG tablet Take 1 tablet every morning, may take second dose daily if needed 07/13/20  Esterwood, Amy S, PA-C  potassium chloride SA (KLOR-CON) 20 MEQ tablet Take 20 mEq by mouth daily.    [provider]  predniSONE (DELTASONE) 5 MG tablet Take 10 mg by mouth daily with breakfast.    [provider]  rosuvastatin (CRESTOR) 10 MG tablet Take 1 tablet (10 mg total) by mouth daily. 11/19/19 11/13/20  Nahser, Wonda Cheng, MD  sertraline (ZOLOFT) 100 MG tablet Take 1 tablet by mouth daily. 03/28/20   [provider]  torsemide (DEMADEX) 20 MG tablet Take 1 tablet (20 mg total) by mouth 2 (two) times daily. 07/25/20   Nahser, Wonda Cheng, MD  traZODone (DESYREL) 50 MG tablet Take 1-2 tablets (50-100 mg total) by mouth at bedtime as needed for sleep. 06/06/20   Binnie Rail, MD    Physical Exam: Vitals:   08/30/2020 0920 08/06/2020 1211  BP: 134/89   Weight:  68 kg  Height:  5\' 2"  (1.575 m)    Constitutional: NAD, calm, comfortable Vitals:   08/29/2020 0920 08/19/2020 1211  BP: 134/89   Weight:  68 kg  Height:  5\' 2"  (1.575 m)   Eyes: PERRL, lids and conjunctivae normal ENMT: Mucous membranes are moist. Posterior pharynx clear of any exudate or lesions.Normal dentition.  Neck: normal, supple, no masses, no thyromegaly Respiratory: clear to auscultation bilaterally, scattered wheezing bilateral basilar crackles, no crackles. Normal respiratory effort. No accessory muscle  use.  Cardiovascular: Irregular heart rate, systolic murmur heart base. 2+ extremity edema. 2+ pedal pulses. No carotid bruits.  Abdomen: no tenderness, no masses palpated. No hepatosplenomegaly. Bowel sounds positive.  Musculoskeletal: no clubbing / cyanosis. No joint deformity upper and lower extremities. Good ROM, no contractures. Normal muscle tone.  Skin: Multiple previous and shallow ulcers on bilateral legs Neurologic: Arousable, very sleepy unable to exam muscle strength and cranial nerves Psychiatric: Sleepy   Labs on Admission: I have personally reviewed following labs and imaging studies  CBC: Recent Labs  Lab 07/27/20 0825 08/18/2020 0918 08/10/2020 0919  WBC 14.8*  --  19.6*  NEUTROABS 10.8*  --  13.1*  HGB 9.8* 11.2* 9.9*  HCT 32.7* 33.0* 33.7*  MCV 95.6  --  95.2  PLT 278  --  481   Basic Metabolic Panel: Recent Labs  Lab 07/27/20 0825 08/04/2020 0918 08/30/2020 0919  NA 140 136 137  K 3.4* 4.5 4.1  CL 90* 91* 90*  CO2 40*  --  32  GLUCOSE 205* 156* 154*  BUN 20 34* 24*  CREATININE 0.99 0.90 1.07*  CALCIUM 9.9  --  9.1   GFR: Estimated Creatinine Clearance: 29.7 mL/min (A) (by C-G formula based on SCr of 1.07 mg/dL (H)). Liver Function Tests: Recent Labs  Lab 07/27/20 0825 08/19/2020 0919  AST 15 37  ALT 20 33  ALKPHOS 98 83  BILITOT 0.7 1.3*  PROT 6.6 6.7  ALBUMIN 3.2* 3.2*   No results for input(s): LIPASE, AMYLASE in the last 168 hours. No results for input(s): AMMONIA in the last 168 hours. Coagulation Profile: Recent Labs  Lab 08/09/2020 0919  INR 1.3*   Cardiac Enzymes: No results for input(s): CKTOTAL, CKMB, CKMBINDEX, TROPONINI in the last 168 hours. BNP (last 3 results) Recent Labs    03/27/20 1529 05/19/20 1518 06/28/20 1048  PROBNP 3,893* 1,426.0* 968.0*   HbA1C: No results for input(s): HGBA1C in the last 72 hours. CBG: No results for input(s): GLUCAP in the last 168 hours. Lipid Profile: No results for input(s): CHOL, HDL,  LDLCALC,  TRIG, CHOLHDL, LDLDIRECT in the last 72 hours. Thyroid Function Tests: No results for input(s): TSH, T4TOTAL, FREET4, T3FREE, THYROIDAB in the last 72 hours. Anemia Panel: No results for input(s): VITAMINB12, FOLATE, FERRITIN, TIBC, IRON, RETICCTPCT in the last 72 hours. Urine analysis:    Component Value Date/Time   COLORURINE YELLOW 03/10/2019 1531   APPEARANCEUR HAZY (A) 03/10/2019 1531   LABSPEC 1.033 (H) 03/10/2019 1531   PHURINE 5.0 03/10/2019 1531   GLUCOSEU NEGATIVE 03/10/2019 1531   HGBUR NEGATIVE 03/10/2019 1531   BILIRUBINUR NEGATIVE 03/10/2019 1531   KETONESUR NEGATIVE 03/10/2019 1531   PROTEINUR 100 (A) 03/10/2019 1531   UROBILINOGEN 0.2 11/30/2014 1830   NITRITE POSITIVE (A) 03/10/2019 1531   LEUKOCYTESUR MODERATE (A) 03/10/2019 1531    Radiological Exams on Admission: CT CEREBRAL PERFUSION W CONTRAST  Result Date: 08/14/2020 CLINICAL DATA:  Stroke.  Left-sided weakness and slurred speech EXAM: CT ANGIOGRAPHY HEAD AND NECK CT PERFUSION BRAIN TECHNIQUE: Multidetector CT imaging of the head and neck was performed using the standard protocol during bolus administration of intravenous contrast. Multiplanar CT image reconstructions and MIPs were obtained to evaluate the vascular anatomy. Carotid stenosis measurements (when applicable) are obtained utilizing NASCET criteria, using the distal internal carotid diameter as the denominator. Multiphase CT imaging of the brain was performed following IV bolus contrast injection. Subsequent parametric perfusion maps were calculated using RAPID software. CONTRAST:  175mL OMNIPAQUE IOHEXOL 350 MG/ML SOLN COMPARISON:  CT head 08/16/2020 FINDINGS: CTA NECK FINDINGS Aortic arch: Atherosclerotic calcification throughout the aortic arch. Ascending aorta measures 42 mm in diameter. Descending thoracic aorta 26 mm in diameter. Negative for dissection. Eccentric plaque or thrombus of the distal arch measuring approximately 2 cm. This is  distal to the left subclavian artery. Proximal great vessels patent without significant stenosis. Mild atherosclerotic disease at the origin the left common carotid and left subclavian artery. Right carotid system: Atherosclerotic calcification right carotid bifurcation. Less than 50% diameter stenosis proximal right internal carotid artery. Left carotid system: Atherosclerotic calcification in the carotid bulb. Adjacent to the calcification, there is a low-density filling defect in the lumen attached to the wall. This could be thrombus or soft plaque. Less than 50% diameter stenosis proximal left internal carotid artery. Kinking of the left internal carotid artery just above the plaque. Vertebral arteries: Both vertebral arteries patent to the basilar without stenosis. Skeleton: Anterolisthesis C3-4 and C4-5. 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 Disc degeneration and spurring C6-7. 4 mm anterolisthesis C7-T1 which appears degenerative. No acute skeletal abnormality. Other neck: Negative for mass or adenopathy. Upper chest: Widespread patchy small areas of infiltrate throughout the lungs bilaterally. Small bilateral pleural effusions. Review of the MIP images confirms the above findings CTA HEAD FINDINGS Anterior circulation: Occlusion right M1 segment distally. There is flow in the anterior and superior branches of the right middle cerebral artery due to collaterals with decreased opacification compared to the contralateral side. Both anterior cerebral arteries widely patent. Left middle cerebral artery widely patent without stenosis. Posterior circulation: Both vertebral arteries patent to the basilar. PICA patent on the right but not left. AICA, superior cerebellar, posterior cerebral arteries patent bilaterally without stenosis. Basilar patent without stenosis. Patent posterior communicating artery on the left. Venous sinuses: Normal venous enhancement. Anatomic variants: None Review of the MIP images confirms the above  findings CT Brain Perfusion Findings: ASPECTS: 10 CBF (<30%) Volume: 38mL Perfusion (Tmax>6.0s) volume: 32mL Mismatch Volume: 41mL Infarction Location:Right MCA territory involving the temporal and parietal lobe. IMPRESSION: 1. CT perfusion positive for 98  mL ischemia in the right MCA territory involving the temporal and parietal lobe. No core infarct on CT perfusion. 2. Acute occlusion right distal M1 segment. There is collateral flow in the right superior and inferior division of the MCA. 3. Less than 50% diameter stenosis proximal right internal carotid artery due to calcific plaque 4. Atherosclerotic calcification left carotid bulb. Associated intraluminal filling defect which may be thrombus or soft plaque. Less than 50% diameter stenosis proximal left internal carotid artery. 5. Both vertebral arteries patent to the basilar 6. Aortic Atherosclerosis (ICD10-I70.0). Aortic aneurysm. Ascending aorta 42 mm in diameter. 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 Recommend annual imaging followup by CTA or MRA. This recommendation follows 2010 ACCF/AHA/AATS/ACR/ASA/SCA/SCAI/SIR/STS/SVM Guidelines for the Diagnosis and Management of Patients with Thoracic Aortic Disease. Circulation. 2010; 121: I458-K998. Aortic aneurysm NOS (ICD10-I71.9) 7. These results were called by telephone at the time of interpretation on 08/27/2020 at 9:54 am to provider [facility] , who verbally acknowledged these results. 8. Extensive patchy airspace disease bilaterally. Correlate with COVID testing. Small pleural effusions. Electronically Signed   By: Franchot Gallo M.D.   On: 08/17/2020 09:56   DG Chest Portable 1 View  Result Date: 08/03/2020 CLINICAL DATA:  Shortness of breath, leukocytosis EXAM: PORTABLE CHEST 1 VIEW COMPARISON:  03/27/2020 FINDINGS: Stable cardiomegaly. Known thoracic aortic aneurysm with extensive atherosclerosis. Patchy airspace opacity at the left lung base with  small left pleural effusion. There are prominent interstitial opacities throughout both lungs. No pneumothorax. Spinal stimulator leads within the thoracic spine are again noted. IMPRESSION: 1. Patchy airspace opacity at the left lung base with small left pleural effusion, concerning for pneumonia. 2. Prominent interstitial opacities throughout both lungs may represent edema versus atypical/viral infection. Electronically Signed   By: Davina Poke D.O.   On: 08/19/2020 10:48   CT HEAD CODE STROKE WO CONTRAST  Result Date: 08/25/2020 CLINICAL DATA:  Code stroke.  Flaccid left side EXAM: CT HEAD WITHOUT CONTRAST TECHNIQUE: Contiguous axial images were obtained from the base of the skull through the vertex without intravenous contrast. COMPARISON:  03/10/2019 FINDINGS: Brain: No evidence of acute infarction, hemorrhage, hydrocephalus, extra-axial collection or mass lesion/mass effect. Atrophy and chronic small vessel ischemia in keeping with age. Vascular: No hyperdense vessel or unexpected calcification. Skull: Normal. Negative for fracture or focal lesion. Sinuses/Orbits: Bilateral cataract resection Other: These results were communicated to [facility] at 9:23 amon 09/21/2021by text page via the [facility] messaging system. ASPECTS [facility] Stroke Program Early CT Score) - Ganglionic level infarction (caudate, lentiform nuclei, internal capsule, insula, M1-M3 cortex): 7 - Supraganglionic infarction (M4-M6 cortex): 3 Total score (0-10 with 10 being normal): 10 IMPRESSION: 1. Aged brain without acute finding. 2. ASPECTS is 10.  No visible thalamic infarct. Electronically Signed   By: Monte Fantasia M.D.   On: 08/20/2020 09:25   CT ANGIO HEAD CODE STROKE  Result Date: 08/02/2020 CLINICAL DATA:  Stroke.  Left-sided weakness and slurred speech EXAM: CT ANGIOGRAPHY HEAD AND NECK CT PERFUSION BRAIN TECHNIQUE: Multidetector CT imaging of the head and neck was performed using the standard protocol during bolus  administration of intravenous contrast. Multiplanar CT image reconstructions and MIPs were obtained to evaluate the vascular anatomy. Carotid stenosis measurements (when applicable) are obtained utilizing NASCET criteria, using the distal internal carotid diameter as the denominator. Multiphase CT imaging of the brain was performed following IV bolus contrast injection. Subsequent parametric perfusion maps were calculated using RAPID software. CONTRAST:  164mL OMNIPAQUE IOHEXOL  350 MG/ML SOLN COMPARISON:  CT head 08/23/2020 FINDINGS: CTA NECK FINDINGS Aortic arch: Atherosclerotic calcification throughout the aortic arch. Ascending aorta measures 42 mm in diameter. Descending thoracic aorta 26 mm in diameter. Negative for dissection. Eccentric plaque or thrombus of the distal arch measuring approximately 2 cm. This is distal to the left subclavian artery. Proximal great vessels patent without significant stenosis. Mild atherosclerotic disease at the origin the left common carotid and left subclavian artery. Right carotid system: Atherosclerotic calcification right carotid bifurcation. Less than 50% diameter stenosis proximal right internal carotid artery. Left carotid system: Atherosclerotic calcification in the carotid bulb. Adjacent to the calcification, there is a low-density filling defect in the lumen attached to the wall. This could be thrombus or soft plaque. Less than 50% diameter stenosis proximal left internal carotid artery. Kinking of the left internal carotid artery just above the plaque. Vertebral arteries: Both vertebral arteries patent to the basilar without stenosis. Skeleton: Anterolisthesis C3-4 and C4-5. 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 Disc degeneration and spurring C6-7. 4 mm anterolisthesis C7-T1 which appears degenerative. No acute skeletal abnormality. Other neck: Negative for mass or adenopathy. Upper chest: Widespread patchy small areas of infiltrate throughout the lungs bilaterally. Small bilateral  pleural effusions. Review of the MIP images confirms the above findings CTA HEAD FINDINGS Anterior circulation: Occlusion right M1 segment distally. There is flow in the anterior and superior branches of the right middle cerebral artery due to collaterals with decreased opacification compared to the contralateral side. Both anterior cerebral arteries widely patent. Left middle cerebral artery widely patent without stenosis. Posterior circulation: Both vertebral arteries patent to the basilar. PICA patent on the right but not left. AICA, superior cerebellar, posterior cerebral arteries patent bilaterally without stenosis. Basilar patent without stenosis. Patent posterior communicating artery on the left. Venous sinuses: Normal venous enhancement. Anatomic variants: None Review of the MIP images confirms the above findings CT Brain Perfusion Findings: ASPECTS: 10 CBF (<30%) Volume: 37mL Perfusion (Tmax>6.0s) volume: 10mL Mismatch Volume: 96mL Infarction Location:Right MCA territory involving the temporal and parietal lobe. IMPRESSION: 1. CT perfusion positive for 98 mL ischemia in the right MCA territory involving the temporal and parietal lobe. No core infarct on CT perfusion. 2. Acute occlusion right distal M1 segment. There is collateral flow in the right superior and inferior division of the MCA. 3. Less than 50% diameter stenosis proximal right internal carotid artery due to calcific plaque 4. Atherosclerotic calcification left carotid bulb. Associated intraluminal filling defect which may be thrombus or soft plaque. Less than 50% diameter stenosis proximal left internal carotid artery. 5. Both vertebral arteries patent to the basilar 6. Aortic Atherosclerosis (ICD10-I70.0). Aortic aneurysm. Ascending aorta 42 mm in diameter. 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 Recommend annual imaging followup by CTA or MRA. This recommendation follows 2010  ACCF/AHA/AATS/ACR/ASA/SCA/SCAI/SIR/STS/SVM Guidelines for the Diagnosis and Management of Patients with Thoracic Aortic Disease. Circulation. 2010; 121: W979-G921. Aortic aneurysm NOS (ICD10-I71.9) 7. These results were called by telephone at the time of interpretation on 08/07/2020 at 9:54 am to provider [facility] , who verbally acknowledged these results. 8. Extensive patchy airspace disease bilaterally. Correlate with COVID testing. Small pleural effusions. Electronically Signed   By: Franchot Gallo M.D.   On: 08/04/2020 09:56   CT ANGIO NECK CODE STROKE  Result Date: 08/29/2020 CLINICAL DATA:  Stroke.  Left-sided weakness and slurred speech EXAM: CT ANGIOGRAPHY HEAD AND NECK CT PERFUSION BRAIN TECHNIQUE: Multidetector CT imaging of the head and neck was performed  using the standard protocol during bolus administration of intravenous contrast. Multiplanar CT image reconstructions and MIPs were obtained to evaluate the vascular anatomy. Carotid stenosis measurements (when applicable) are obtained utilizing NASCET criteria, using the distal internal carotid diameter as the denominator. Multiphase CT imaging of the brain was performed following IV bolus contrast injection. Subsequent parametric perfusion maps were calculated using RAPID software. CONTRAST:  158mL OMNIPAQUE IOHEXOL 350 MG/ML SOLN COMPARISON:  CT head 08/04/2020 FINDINGS: CTA NECK FINDINGS Aortic arch: Atherosclerotic calcification throughout the aortic arch. Ascending aorta measures 42 mm in diameter. Descending thoracic aorta 26 mm in diameter. Negative for dissection. Eccentric plaque or thrombus of the distal arch measuring approximately 2 cm. This is distal to the left subclavian artery. Proximal great vessels patent without significant stenosis. Mild atherosclerotic disease at the origin the left common carotid and left subclavian artery. Right carotid system: Atherosclerotic calcification right carotid bifurcation. Less than 50%  diameter stenosis proximal right internal carotid artery. Left carotid system: Atherosclerotic calcification in the carotid bulb. Adjacent to the calcification, there is a low-density filling defect in the lumen attached to the wall. This could be thrombus or soft plaque. Less than 50% diameter stenosis proximal left internal carotid artery. Kinking of the left internal carotid artery just above the plaque. Vertebral arteries: Both vertebral arteries patent to the basilar without stenosis. Skeleton: Anterolisthesis C3-4 and C4-5. 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 Disc degeneration and spurring C6-7. 4 mm anterolisthesis C7-T1 which appears degenerative. No acute skeletal abnormality. Other neck: Negative for mass or adenopathy. Upper chest: Widespread patchy small areas of infiltrate throughout the lungs bilaterally. Small bilateral pleural effusions. Review of the MIP images confirms the above findings CTA HEAD FINDINGS Anterior circulation: Occlusion right M1 segment distally. There is flow in the anterior and superior branches of the right middle cerebral artery due to collaterals with decreased opacification compared to the contralateral side. Both anterior cerebral arteries widely patent. Left middle cerebral artery widely patent without stenosis. Posterior circulation: Both vertebral arteries patent to the basilar. PICA patent on the right but not left. AICA, superior cerebellar, posterior cerebral arteries patent bilaterally without stenosis. Basilar patent without stenosis. Patent posterior communicating artery on the left. Venous sinuses: Normal venous enhancement. Anatomic variants: None Review of the MIP images confirms the above findings CT Brain Perfusion Findings: ASPECTS: 10 CBF (<30%) Volume: 62mL Perfusion (Tmax>6.0s) volume: 76mL Mismatch Volume: 48mL Infarction Location:Right MCA territory involving the temporal and parietal lobe. IMPRESSION: 1. CT perfusion positive for 98 mL ischemia in the right MCA  territory involving the temporal and parietal lobe. No core infarct on CT perfusion. 2. Acute occlusion right distal M1 segment. There is collateral flow in the right superior and inferior division of the MCA. 3. Less than 50% diameter stenosis proximal right internal carotid artery due to calcific plaque 4. Atherosclerotic calcification left carotid bulb. Associated intraluminal filling defect which may be thrombus or soft plaque. Less than 50% diameter stenosis proximal left internal carotid artery. 5. Both vertebral arteries patent to the basilar 6. Aortic Atherosclerosis (ICD10-I70.0). Aortic aneurysm. Ascending aorta 42 mm in diameter. 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 Recommend annual imaging followup by CTA or MRA. This recommendation follows 2010 ACCF/AHA/AATS/ACR/ASA/SCA/SCAI/SIR/STS/SVM Guidelines for the Diagnosis and Management of Patients with Thoracic Aortic Disease. Circulation. 2010; 121: B846-K599. Aortic aneurysm NOS (ICD10-I71.9) 7. These results were called by telephone at the time of interpretation on 08/29/2020 at 9:54 am to provider [facility] , who verbally acknowledged these  results. 8. Extensive patchy airspace disease bilaterally. Correlate with COVID testing. Small pleural effusions. Electronically Signed   By: Franchot Gallo M.D.   On: 08/14/2020 09:56    EKG: Independently reviewed.  Rapid A. fib with no significant ST changes  Assessment/Plan Active Problems:   CVA (cerebral vascular accident) ([place])    CVA -Probably embolic given new onset/found of A. fib, patient husband reported that patient has a pulse ox meter at home and patient heart rate baseline is 100s at least last 3 months. Agreed with neurology that patient has high risk of bleeding given that her age, history of GI and metastatic lung cancer, and she is on chronic steroid, not a candidate for systemic anticoagulation. Agreed with aspirin Plavix but  nurse report the patient had trouble to swallow. -Prognosis guarded, low discussed with husband at bedside, agreed with palliative care consultation, with overall picture of deteriorating ambulation function, quite advanced metastatic lung cancer probably GI sourced, probably eventually will need hospice. Husband still maintains high hopes of improvement, and wants to hear from oncologist, whom he talked once after the CAT scan earlier this month for further recommendations. -NPO, PT, speech evaluation.  A. fib with RVR -Blood pressure dropped with Cardizem drip. Given the most recent echocardiogram 2020 showed decreased EF 35 to 40%, will discontinue Cardizem drip and start digoxin loading 0.25x4 doses. Discussed with cardiology who will come to see the patient emergently.  Leukocytosis, worsening -White count was elevated 3 weeks ago, both consider related to leg cellulitis. -Husband denied patient having any fever or chills at home, no diarrhea no increasing short of breath. X-ray concerns for infiltrate however CT scan earlier this month showed bilateral pleural effusion and multiple nodules likely metastatic. -We will wait for UA to decide whether start antibiotics.  Acute on chronic systolic CHF decompensation -Blood pressure borderline low, hold Lasix for today -Rate control for RVR as above  Chronic adrenal insufficiency secondary to chronic steroid use -Switch to stress dose hydrocortisone for now  Hypothyroidism -Continue Synthroid  Anxiety depression -Continue SSRI  Deconditioning -Both ambulatory function and eating, patient family has been feeding patient with more Ensure recent weeks. -Consult nutrition  DVT prophylaxis: Lovenox Code Status: DNR Family Communication: Husband at bedside Disposition Plan: Patient is sick with acute on chronic issues, stroke, A. fib, underlying lung cancer, expect 3 to 5 days hospital stay, if condition not improving, consider  hospice. Consults called: Neurology and cardiology Admission status: Telemetry admission   Lequita Halt MD [facility] Pager [phone]  08/19/2020, 1:16 PM

## 2020-08-02 NOTE — ED Notes (Signed)
PA was notified that patient voided while being changed and unable to get any urine at this time. PA also notified that urine had a foul odor and was thick and white in color.

## 2020-08-02 NOTE — ED Notes (Signed)
Pt SPO2 drooped to the low 86% on 4L Superior pt changed to NRM SPO2 up to 100%

## 2020-08-02 NOTE — ED Provider Notes (Addendum)
[place] EMERGENCY DEPARTMENT Provider Note   CSN: [id number] Arrival date & time: 08/25/2020  3235  An emergency department physician performed an initial assessment on this suspected stroke patient at 0930 (During CTA).  History No chief complaint on file.   Tamara Gregory is a 86 y.o. female with PMH significant for myasthenia gravis, HTN, HLD, and thoracic aneurysm who presents to the ED via EMS as a code stroke.   I reviewed patient's medical record and she recently had   Patient was diagnosed with cancer of cecum and is s/p robotic colectomy performed 03/17/2018.  CT abdomen pelvis with contrast obtained 07/07/2020 which reviewed numerous solid pulmonary nodules lung bases bilaterally concerning for metastatic disease.  No recurrence of malignancy noted in abdomen.  Patient is wheelchair bound and requires 3 L supplemental oxygen.  Patient was seen by her oncologist on 07/27/2020 and they report that she is taking Keflex for a leg hematoma, as managed by PCP Dr. Quay Burow.  She was noted to be chronically ill with increased functional decline over the course of the past few months.  On my examination, patient is demonstrating a mildly increased work of breathing and is sitting at 90% on 3 L supplemental O2 via [place].  Her husband is at bedside and states that they drove to [facility] yesterday and would have spent the night if not for forgetting the portable oxygen tank.  They ultimately returned last evening and she was at her baseline cognition and functional status.  Despite being wheelchair-bound, she is typically able to assist herself into the wheelchair and onto the toilet throughout the day.  However, she was unable to get out of bed this morning which is what prompted him to call 911.  Obtained history from EMS reports that also reported that she was in atrial fibrillation with RVR, however BP was stable.  No history of atrial fibrillation.  Patient is followed by cardiology  for her congestive heart failure.  She was scheduled to see Dr. Acie Fredrickson tomorrow.  She has a known 4 cm fusiform ascending aortic aneurysm.  Denies any chest pain.   Patient has been immunized for COVID-19.  HPI     Past Medical History:  Diagnosis Date  . Anemia   . Anxiety   . Arthritis   . Cancer (HCC)    hx of skin cancer , colon   . Cataract    removed both eyes   . Chronic back pain    okay with sitting, increased back pain with activity   . Depression   . GERD (gastroesophageal reflux disease)   . Glaucoma   . Headache(784.0)    hx of  . History of kidney stones   . History of migraines   . Hyperlipidemia   . Hypertension   . Macular degeneration   . Myasthenia gravis ([place])   . Occasional tremors   . Pneumonia   . Spinal stenosis   . Thoracic aneurysm    4.4cm; see CT done 01/28/12 and 01/22/13 in [facility].   [name] Uses hearing aid   . Vitamin D deficiency     Patient Active Problem List   Diagnosis Date Noted  . Rib pain on left side 01/24/2020  . Acute pain of left shoulder 01/24/2020  . Abdominal pain 12/08/2019  . Decreased mobility and endurance 11/15/2019  . Anemia 08/29/2019  . CAD (coronary artery disease) 08/17/2019  . invasive SCC (squamous cell carcinoma), leg, left 07/31/2019  . Hypothyroidism  07/01/2019  . Closed fracture of left hip ([place]) 06/30/2019  . Hair loss 06/29/2019  . Chronic systolic heart failure ([place]) 05/14/2019  . Sleep disturbance 04/29/2019  . Tachycardia   . Steroid-induced hyperglycemia   . Leukocytosis   . E. coli UTI   . Hypoalbuminemia due to protein-calorie malnutrition ([place])   . Ocular myasthenia gravis ([place])   . Hip fracture ([place]) 03/10/2019  . Left displaced femoral neck fracture ([place]) 03/10/2019  . Weakness 12/04/2018  . 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 Right ovarian cyst s/p RSO 03/17/2018  . Diarrhea 12/23/2017  . Large hiatal hernia 04/01/2017  . Sweating profusely 03/19/2017  .  Hyperglycemia 03/19/2017  . Squamous cell skin cancer 11/05/2016  . Nosebleed 10/19/2016  . Osteoporosis 08/28/2016  . Essential hypertension, benign 08/21/2016  . Long term current use of systemic steroids 08/21/2016  . Primary open angle glaucoma of both eyes, mild stage 05/14/2016  . Ocular hypertension of left eye 05/08/2016  . GERD (gastroesophageal reflux disease) 05/06/2016  . Macular pucker, bilateral 11/14/2015  . Status post intraocular lens implant 04/04/2015  . Exudative age-related macular degeneration ([place]) 04/04/2015  . Chronic pain syndrome 02/07/2015  . Bilateral nonexudative age-related macular degeneration 01/16/2015  . Urge incontinence of urine 01/17/2014  . Urinary urgency 01/17/2014  . Malaise and fatigue 10/04/2013  . PAC (premature atrial contraction) 10/04/2013  . Depression 02/15/2013  . Myasthenia gravis ([place]) 03/17/2012  . Spinal stenosis, lumbar 01/14/2012  . Ptosis of eyelid 01/14/2012  . Pure hypercholesterolemia 09/05/2011  . Benign hypertensive heart disease without heart failure 09/05/2011  . Osteoarthritis 09/05/2011    Past Surgical History:  Procedure Laterality Date  . ABDOMINAL HYSTERECTOMY    . CHOLECYSTECTOMY    . COLON SURGERY     Right colectomy dr. gross 03-17-18  . HIP ARTHROPLASTY Left 03/11/2019   Procedure: ARTHROPLASTY BIPOLAR HIP (HEMIARTHROPLASTY);  Surgeon: Renette Butters, MD;  Location: [facility];  Service: Orthopedics;  Laterality: Left;  . LUMBAR LAMINECTOMY/DECOMPRESSION MICRODISCECTOMY N/A 02/04/2013   Procedure: CENTRAL DECOMPRESSION/LUMBAR LAMINECTOMY L3-L4 AND L4-L5 2 LEVELS;  Surgeon: Tobi Bastos, MD;  Location: [facility];  Service: Orthopedics;  Laterality: N/A;  . SPINAL CORD STIMULATOR IMPLANT     in right hip but not currently using because it did not help  . TONSILLECTOMY    . UPPER GASTROINTESTINAL ENDOSCOPY    . US ECHOCARDIOGRAPHY  05/18/2007   EF 55-60%     OB History   No obstetric history on file.      Family History  Problem Relation Age of Onset  . Heart attack Father   . Diabetes Father   . Cancer Sister   . Breast cancer Sister   . Cancer Brother   . Lung cancer Brother   . Cancer Sister   . Liver cancer Sister   . Diabetes Sister   . Healthy Son   . Colon polyps Neg Hx   . Colon cancer Neg Hx   . Esophageal cancer Neg Hx   . Rectal cancer Neg Hx   . Stomach cancer Neg Hx   . Adrenal disorder Neg Hx     Social History   Tobacco Use  . Smoking status: Never Smoker  . Smokeless tobacco: Never Used  Vaping Use  . Vaping Use: Never used  Substance Use Topics  . Alcohol use: No  . Drug use: No    Home Medications Prior to Admission medications   Medication  Sig Start Date End Date Taking? Authorizing Provider  acetaminophen (TYLENOL) 325 MG tablet Take 2 tablets (650 mg total) by mouth every 6 (six) hours as needed for headache. 03/29/19   Angiulli, Lavon Paganini, PA-C  aspirin 81 MG chewable tablet Chew 0.5 tablets (40.5 mg total) by mouth daily. 03/20/18   Stark Klein, MD  brimonidine (ALPHAGAN) 0.2 % ophthalmic solution Place 1 drop into both eyes 3 (three) times daily.     [provider]  cephALEXin (KEFLEX) 500 MG capsule Take 1 capsule (500 mg total) by mouth 3 (three) times daily. 07/18/20   Marrian Salvage, FNP  Cholecalciferol (VITAMIN D) 125 MCG (5000 UT) CAPS Take 5,000 Units by mouth daily.     [provider]  fluticasone (FLONASE) 50 MCG/ACT nasal spray Place 1 spray into both nostrils at bedtime.    [provider]  latanoprost (XALATAN) 0.005 % ophthalmic solution Place 1 drop into the left eye at bedtime. 04/13/15   [provider]  levothyroxine (SYNTHROID) 25 MCG tablet Take 1 tablet by mouth daily before breakfast. 04/03/20   Burns, Claudina Lick, MD  loperamide (IMODIUM) 2 MG capsule Take 2 mg by mouth at bedtime. 06/09/20   [provider]  losartan (COZAAR) 25 MG tablet Take 25 mg by mouth daily. 06/09/20    [provider]  mirtazapine (REMERON) 7.5 MG tablet Take 7.5 mg by mouth at bedtime. 06/09/20   [provider]  Multiple Vitamins-Minerals (PRESERVISION AREDS) CAPS Take 1 capsule by mouth 2 (two) times daily.     [provider]  Omega-3 Fatty Acids (FISH OIL) 435 MG CAPS Take 435 mg by mouth daily.    [provider]  omeprazole (PRILOSEC) 40 MG capsule Take 80 mg by mouth daily.    [provider]  ondansetron (ZOFRAN) 4 MG tablet Take 1 tablet every morning, may take second dose daily if needed 07/13/20   Esterwood, Amy S, PA-C  potassium chloride SA (KLOR-CON) 20 MEQ tablet Take 20 mEq by mouth daily.    [provider]  predniSONE (DELTASONE) 5 MG tablet Take 10 mg by mouth daily with breakfast.    [provider]  rosuvastatin (CRESTOR) 10 MG tablet Take 1 tablet (10 mg total) by mouth daily. 11/19/19 11/13/20  Nahser, Wonda Cheng, MD  sertraline (ZOLOFT) 100 MG tablet Take 1 tablet by mouth daily. 03/28/20   [provider]  torsemide (DEMADEX) 20 MG tablet Take 1 tablet (20 mg total) by mouth 2 (two) times daily. 07/25/20   Nahser, Wonda Cheng, MD  traZODone (DESYREL) 50 MG tablet Take 1-2 tablets (50-100 mg total) by mouth at bedtime as needed for sleep. 06/06/20   Binnie Rail, MD    Allergies    Tramadol  Review of Systems   Review of Systems  Unable to perform ROS: Acuity of condition    Physical Exam Updated Vital Signs BP 134/89   Ht 5\' 2"  (1.575 m)   Wt 68 kg   BMI 27.44 kg/m   Physical Exam Vitals and nursing note reviewed. Exam conducted with a chaperone present.  Constitutional:      Appearance: Normal appearance.  HENT:     Head: Normocephalic and atraumatic.  Eyes:     General: No scleral icterus.    Comments: Unable to gaze towards the left.    Cardiovascular:     Rate and Rhythm: Tachycardia present. Rhythm irregular.     Pulses: Normal pulses.  Heart sounds: Normal heart sounds.    Pulmonary:     Effort: Pulmonary effort is normal.     Breath sounds: Normal breath sounds.  Musculoskeletal:     Cervical back: Normal range of motion. No rigidity.     Comments: Lesion noted on right lower leg, chronic.     Skin:    General: Skin is dry.     Capillary Refill: Capillary refill takes less than 2 seconds.  Neurological:     Mental Status: She is alert.     GCS: GCS eye subscore is 4. GCS verbal subscore is 5. GCS motor subscore is 6.     Comments: Diminished left-sided gaze.  Left-sided weakness.  Cannot assess gait.    Psychiatric:        Mood and Affect: Mood normal.        Behavior: Behavior normal.        Thought Content: Thought content normal.     ED Results / Procedures / Treatments   Labs (all labs ordered are listed, but only abnormal results are displayed) Labs Reviewed  PROTIME-INR - Abnormal; Notable for the following components:      Result Value   Prothrombin Time 15.3 (*)    INR 1.3 (*)    All other components within normal limits  CBC - Abnormal; Notable for the following components:   WBC 19.6 (*)    RBC 3.54 (*)    Hemoglobin 9.9 (*)    HCT 33.7 (*)    MCHC 29.4 (*)    RDW 15.9 (*)    All other components within normal limits  DIFFERENTIAL - Abnormal; Notable for the following components:   Neutro Abs 13.1 (*)    Lymphs Abs 0.4 (*)    Monocytes Absolute 5.4 (*)    Abs Immature Granulocytes 0.44 (*)    All other components within normal limits  COMPREHENSIVE METABOLIC PANEL - Abnormal; Notable for the following components:   Chloride 90 (*)    Glucose, Bld 154 (*)    BUN 24 (*)    Creatinine, Ser 1.07 (*)    Albumin 3.2 (*)    Total Bilirubin 1.3 (*)    GFR calc non Af Amer 45 (*)    GFR calc Af Amer 52 (*)    All other components within normal limits  I-STAT CHEM 8, ED - Abnormal; Notable for the following components:   Chloride 91 (*)    BUN 34 (*)    Glucose, Bld 156 (*)    Calcium, Ion 1.03 (*)    TCO2 38 (*)     Hemoglobin 11.2 (*)    HCT 33.0 (*)    All other components within normal limits  SARS CORONAVIRUS 2 BY RT PCR (HOSPITAL ORDER, [facility])  ETHANOL  APTT  RAPID URINE DRUG SCREEN, HOSP PERFORMED  URINALYSIS, ROUTINE W REFLEX MICROSCOPIC  PATHOLOGIST SMEAR REVIEW    EKG None  Radiology CT CEREBRAL PERFUSION W CONTRAST  Result Date: 08/18/2020 CLINICAL DATA:  Stroke.  Left-sided weakness and slurred speech EXAM: CT ANGIOGRAPHY HEAD AND NECK CT PERFUSION BRAIN TECHNIQUE: Multidetector CT imaging of the head and neck was performed using the standard protocol during bolus administration of intravenous contrast. Multiplanar CT image reconstructions and MIPs were obtained to evaluate the vascular anatomy. Carotid stenosis measurements (when applicable) are obtained utilizing NASCET criteria, using the distal internal carotid diameter as the denominator. Multiphase CT imaging of the brain was performed following IV  bolus contrast injection. Subsequent parametric perfusion maps were calculated using RAPID software. CONTRAST:  123mL OMNIPAQUE IOHEXOL 350 MG/ML SOLN COMPARISON:  CT head 08/14/2020 FINDINGS: CTA NECK FINDINGS Aortic arch: Atherosclerotic calcification throughout the aortic arch. Ascending aorta measures 42 mm in diameter. Descending thoracic aorta 26 mm in diameter. Negative for dissection. Eccentric plaque or thrombus of the distal arch measuring approximately 2 cm. This is distal to the left subclavian artery. Proximal great vessels patent without significant stenosis. Mild atherosclerotic disease at the origin the left common carotid and left subclavian artery. Right carotid system: Atherosclerotic calcification right carotid bifurcation. Less than 50% diameter stenosis proximal right internal carotid artery. Left carotid system: Atherosclerotic calcification in the carotid bulb. Adjacent to the calcification, there is a low-density filling defect in the lumen  attached to the wall. This could be thrombus or soft plaque. Less than 50% diameter stenosis proximal left internal carotid artery. Kinking of the left internal carotid artery just above the plaque. Vertebral arteries: Both vertebral arteries patent to the basilar without stenosis. Skeleton: Anterolisthesis C3-4 and C4-5. 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 Disc degeneration and spurring C6-7. 4 mm anterolisthesis C7-T1 which appears degenerative. No acute skeletal abnormality. Other neck: Negative for mass or adenopathy. Upper chest: Widespread patchy small areas of infiltrate throughout the lungs bilaterally. Small bilateral pleural effusions. Review of the MIP images confirms the above findings CTA HEAD FINDINGS Anterior circulation: Occlusion right M1 segment distally. There is flow in the anterior and superior branches of the right middle cerebral artery due to collaterals with decreased opacification compared to the contralateral side. Both anterior cerebral arteries widely patent. Left middle cerebral artery widely patent without stenosis. Posterior circulation: Both vertebral arteries patent to the basilar. PICA patent on the right but not left. AICA, superior cerebellar, posterior cerebral arteries patent bilaterally without stenosis. Basilar patent without stenosis. Patent posterior communicating artery on the left. Venous sinuses: Normal venous enhancement. Anatomic variants: None Review of the MIP images confirms the above findings CT Brain Perfusion Findings: ASPECTS: 10 CBF (<30%) Volume: 45mL Perfusion (Tmax>6.0s) volume: 68mL Mismatch Volume: 63mL Infarction Location:Right MCA territory involving the temporal and parietal lobe. IMPRESSION: 1. CT perfusion positive for 98 mL ischemia in the right MCA territory involving the temporal and parietal lobe. No core infarct on CT perfusion. 2. Acute occlusion right distal M1 segment. There is collateral flow in the right superior and inferior division of the MCA. 3. Less  than 50% diameter stenosis proximal right internal carotid artery due to calcific plaque 4. Atherosclerotic calcification left carotid bulb. Associated intraluminal filling defect which may be thrombus or soft plaque. Less than 50% diameter stenosis proximal left internal carotid artery. 5. Both vertebral arteries patent to the basilar 6. Aortic Atherosclerosis (ICD10-I70.0). Aortic aneurysm. Ascending aorta 42 mm in diameter. 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 Recommend annual imaging followup by CTA or MRA. This recommendation follows 2010 ACCF/AHA/AATS/ACR/ASA/SCA/SCAI/SIR/STS/SVM Guidelines for the Diagnosis and Management of Patients with Thoracic Aortic Disease. Circulation. 2010; 121: S923-R007. Aortic aneurysm NOS (ICD10-I71.9) 7. These results were called by telephone at the time of interpretation on 08/17/2020 at 9:54 am to provider [facility] , who verbally acknowledged these results. 8. Extensive patchy airspace disease bilaterally. Correlate with COVID testing. Small pleural effusions. Electronically Signed   By: Franchot Gallo M.D.   On: 08/13/2020 09:56   DG Chest Portable 1 View  Result Date: 08/09/2020 CLINICAL DATA:  Shortness of breath, leukocytosis EXAM: PORTABLE CHEST 1 VIEW COMPARISON:  03/27/2020 FINDINGS: Stable cardiomegaly. Known thoracic aortic aneurysm with extensive atherosclerosis. Patchy airspace opacity at the left lung base with small left pleural effusion. There are prominent interstitial opacities throughout both lungs. No pneumothorax. Spinal stimulator leads within the thoracic spine are again noted. IMPRESSION: 1. Patchy airspace opacity at the left lung base with small left pleural effusion, concerning for pneumonia. 2. Prominent interstitial opacities throughout both lungs may represent edema versus atypical/viral infection. Electronically Signed   By: Davina Poke D.O.   On: 08/30/2020 10:48   CT HEAD CODE  STROKE WO CONTRAST  Result Date: 08/21/2020 CLINICAL DATA:  Code stroke.  Flaccid left side EXAM: CT HEAD WITHOUT CONTRAST TECHNIQUE: Contiguous axial images were obtained from the base of the skull through the vertex without intravenous contrast. COMPARISON:  03/10/2019 FINDINGS: Brain: No evidence of acute infarction, hemorrhage, hydrocephalus, extra-axial collection or mass lesion/mass effect. Atrophy and chronic small vessel ischemia in keeping with age. Vascular: No hyperdense vessel or unexpected calcification. Skull: Normal. Negative for fracture or focal lesion. Sinuses/Orbits: Bilateral cataract resection Other: These results were communicated to [facility] at 9:23 amon 09/26/2021by text page via the [facility] messaging system. ASPECTS [facility] Stroke Program Early CT Score) - Ganglionic level infarction (caudate, lentiform nuclei, internal capsule, insula, M1-M3 cortex): 7 - Supraganglionic infarction (M4-M6 cortex): 3 Total score (0-10 with 10 being normal): 10 IMPRESSION: 1. Aged brain without acute finding. 2. ASPECTS is 10.  No visible thalamic infarct. Electronically Signed   By: Monte Fantasia M.D.   On: 08/25/2020 09:25   CT ANGIO HEAD CODE STROKE  Result Date: 08/29/2020 CLINICAL DATA:  Stroke.  Left-sided weakness and slurred speech EXAM: CT ANGIOGRAPHY HEAD AND NECK CT PERFUSION BRAIN TECHNIQUE: Multidetector CT imaging of the head and neck was performed using the standard protocol during bolus administration of intravenous contrast. Multiplanar CT image reconstructions and MIPs were obtained to evaluate the vascular anatomy. Carotid stenosis measurements (when applicable) are obtained utilizing NASCET criteria, using the distal internal carotid diameter as the denominator. Multiphase CT imaging of the brain was performed following IV bolus contrast injection. Subsequent parametric perfusion maps were calculated using RAPID software. CONTRAST:  151mL OMNIPAQUE IOHEXOL 350 MG/ML SOLN COMPARISON:   CT head 08/02/2020 FINDINGS: CTA NECK FINDINGS Aortic arch: Atherosclerotic calcification throughout the aortic arch. Ascending aorta measures 42 mm in diameter. Descending thoracic aorta 26 mm in diameter. Negative for dissection. Eccentric plaque or thrombus of the distal arch measuring approximately 2 cm. This is distal to the left subclavian artery. Proximal great vessels patent without significant stenosis. Mild atherosclerotic disease at the origin the left common carotid and left subclavian artery. Right carotid system: Atherosclerotic calcification right carotid bifurcation. Less than 50% diameter stenosis proximal right internal carotid artery. Left carotid system: Atherosclerotic calcification in the carotid bulb. Adjacent to the calcification, there is a low-density filling defect in the lumen attached to the wall. This could be thrombus or soft plaque. Less than 50% diameter stenosis proximal left internal carotid artery. Kinking of the left internal carotid artery just above the plaque. Vertebral arteries: Both vertebral arteries patent to the basilar without stenosis. Skeleton: Anterolisthesis C3-4 and C4-5. 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 Disc degeneration and spurring C6-7. 4 mm anterolisthesis C7-T1 which appears degenerative. No acute skeletal abnormality. Other neck: Negative for mass or adenopathy. Upper chest: Widespread patchy small areas of infiltrate throughout the lungs bilaterally. Small bilateral pleural effusions. Review of the MIP images confirms the above findings CTA HEAD FINDINGS Anterior circulation: Occlusion right M1 segment  distally. There is flow in the anterior and superior branches of the right middle cerebral artery due to collaterals with decreased opacification compared to the contralateral side. Both anterior cerebral arteries widely patent. Left middle cerebral artery widely patent without stenosis. Posterior circulation: Both vertebral arteries patent to the basilar. PICA patent  on the right but not left. AICA, superior cerebellar, posterior cerebral arteries patent bilaterally without stenosis. Basilar patent without stenosis. Patent posterior communicating artery on the left. Venous sinuses: Normal venous enhancement. Anatomic variants: None Review of the MIP images confirms the above findings CT Brain Perfusion Findings: ASPECTS: 10 CBF (<30%) Volume: 48mL Perfusion (Tmax>6.0s) volume: 84mL Mismatch Volume: 56mL Infarction Location:Right MCA territory involving the temporal and parietal lobe. IMPRESSION: 1. CT perfusion positive for 98 mL ischemia in the right MCA territory involving the temporal and parietal lobe. No core infarct on CT perfusion. 2. Acute occlusion right distal M1 segment. There is collateral flow in the right superior and inferior division of the MCA. 3. Less than 50% diameter stenosis proximal right internal carotid artery due to calcific plaque 4. Atherosclerotic calcification left carotid bulb. Associated intraluminal filling defect which may be thrombus or soft plaque. Less than 50% diameter stenosis proximal left internal carotid artery. 5. Both vertebral arteries patent to the basilar 6. Aortic Atherosclerosis (ICD10-I70.0). Aortic aneurysm. Ascending aorta 42 mm in diameter. 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 Recommend annual imaging followup by CTA or MRA. This recommendation follows 2010 ACCF/AHA/AATS/ACR/ASA/SCA/SCAI/SIR/STS/SVM Guidelines for the Diagnosis and Management of Patients with Thoracic Aortic Disease. Circulation. 2010; 121: U235-T614. Aortic aneurysm NOS (ICD10-I71.9) 7. These results were called by telephone at the time of interpretation on 08/19/2020 at 9:54 am to provider [facility] , who verbally acknowledged these results. 8. Extensive patchy airspace disease bilaterally. Correlate with COVID testing. Small pleural effusions. Electronically Signed   By: Franchot Gallo M.D.   On:  08/19/2020 09:56   CT ANGIO NECK CODE STROKE  Result Date: 08/28/2020 CLINICAL DATA:  Stroke.  Left-sided weakness and slurred speech EXAM: CT ANGIOGRAPHY HEAD AND NECK CT PERFUSION BRAIN TECHNIQUE: Multidetector CT imaging of the head and neck was performed using the standard protocol during bolus administration of intravenous contrast. Multiplanar CT image reconstructions and MIPs were obtained to evaluate the vascular anatomy. Carotid stenosis measurements (when applicable) are obtained utilizing NASCET criteria, using the distal internal carotid diameter as the denominator. Multiphase CT imaging of the brain was performed following IV bolus contrast injection. Subsequent parametric perfusion maps were calculated using RAPID software. CONTRAST:  120mL OMNIPAQUE IOHEXOL 350 MG/ML SOLN COMPARISON:  CT head 08/03/2020 FINDINGS: CTA NECK FINDINGS Aortic arch: Atherosclerotic calcification throughout the aortic arch. Ascending aorta measures 42 mm in diameter. Descending thoracic aorta 26 mm in diameter. Negative for dissection. Eccentric plaque or thrombus of the distal arch measuring approximately 2 cm. This is distal to the left subclavian artery. Proximal great vessels patent without significant stenosis. Mild atherosclerotic disease at the origin the left common carotid and left subclavian artery. Right carotid system: Atherosclerotic calcification right carotid bifurcation. Less than 50% diameter stenosis proximal right internal carotid artery. Left carotid system: Atherosclerotic calcification in the carotid bulb. Adjacent to the calcification, there is a low-density filling defect in the lumen attached to the wall. This could be thrombus or soft plaque. Less than 50% diameter stenosis proximal left internal carotid artery. Kinking of the left internal carotid artery just above the plaque. Vertebral arteries: Both vertebral arteries patent to  the basilar without stenosis. Skeleton: Anterolisthesis C3-4 and  C4-5. 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 Disc degeneration and spurring C6-7. 4 mm anterolisthesis C7-T1 which appears degenerative. No acute skeletal abnormality. Other neck: Negative for mass or adenopathy. Upper chest: Widespread patchy small areas of infiltrate throughout the lungs bilaterally. Small bilateral pleural effusions. Review of the MIP images confirms the above findings CTA HEAD FINDINGS Anterior circulation: Occlusion right M1 segment distally. There is flow in the anterior and superior branches of the right middle cerebral artery due to collaterals with decreased opacification compared to the contralateral side. Both anterior cerebral arteries widely patent. Left middle cerebral artery widely patent without stenosis. Posterior circulation: Both vertebral arteries patent to the basilar. PICA patent on the right but not left. AICA, superior cerebellar, posterior cerebral arteries patent bilaterally without stenosis. Basilar patent without stenosis. Patent posterior communicating artery on the left. Venous sinuses: Normal venous enhancement. Anatomic variants: None Review of the MIP images confirms the above findings CT Brain Perfusion Findings: ASPECTS: 10 CBF (<30%) Volume: 53mL Perfusion (Tmax>6.0s) volume: 46mL Mismatch Volume: 26mL Infarction Location:Right MCA territory involving the temporal and parietal lobe. IMPRESSION: 1. CT perfusion positive for 98 mL ischemia in the right MCA territory involving the temporal and parietal lobe. No core infarct on CT perfusion. 2. Acute occlusion right distal M1 segment. There is collateral flow in the right superior and inferior division of the MCA. 3. Less than 50% diameter stenosis proximal right internal carotid artery due to calcific plaque 4. Atherosclerotic calcification left carotid bulb. Associated intraluminal filling defect which may be thrombus or soft plaque. Less than 50% diameter stenosis proximal left internal carotid artery. 5. Both vertebral arteries  patent to the basilar 6. Aortic Atherosclerosis (ICD10-I70.0). Aortic aneurysm. Ascending aorta 42 mm in diameter. 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 Recommend annual imaging followup by CTA or MRA. This recommendation follows 2010 ACCF/AHA/AATS/ACR/ASA/SCA/SCAI/SIR/STS/SVM Guidelines for the Diagnosis and Management of Patients with Thoracic Aortic Disease. Circulation. 2010; 121: N829-F621. Aortic aneurysm NOS (ICD10-I71.9) 7. These results were called by telephone at the time of interpretation on 08/24/2020 at 9:54 am to provider [facility] , who verbally acknowledged these results. 8. Extensive patchy airspace disease bilaterally. Correlate with COVID testing. Small pleural effusions. Electronically Signed   By: Franchot Gallo M.D.   On: 08/25/2020 09:56    Procedures .Critical Care Performed by: Corena Herter, PA-C Authorized by: Corena Herter, PA-C   Critical care provider statement:    Critical care time (minutes):  45   Critical care was necessary to treat or prevent imminent or life-threatening deterioration of the following conditions:  CNS failure or compromise   Critical care was time spent personally by me on the following activities:  Discussions with consultants, evaluation of patient's response to treatment, examination of patient, ordering and performing treatments and interventions, ordering and review of laboratory studies, ordering and review of radiographic studies, pulse oximetry, re-evaluation of patient's condition, obtaining history from patient or surrogate, review of old charts and blood draw for specimens Comments:     Acute MCA infarct.   (including critical care time)  Medications Ordered in ED Medications  aspirin chewable tablet 324 mg (324 mg Oral Given 08/03/2020 1233)  clopidogrel (PLAVIX) tablet 75 mg (75 mg Oral Given 08/10/2020 1233)  atorvastatin (LIPITOR) tablet 80 mg (80 mg Oral Not Given 08/31/2020  1239)  iohexol (OMNIPAQUE) 350 MG/ML injection 100 mL (100 mLs Intravenous Contrast Given 08/09/2020 0935)  morphine 4  MG/ML injection 4 mg (4 mg Intravenous Given 08/06/2020 1211)    ED Course  I have reviewed the triage vital signs and the nursing notes.  Pertinent labs & imaging results that were available during my care of the patient were reviewed by me and considered in my medical decision making (see chart for details).  Clinical Course as of Aug 02 1258  Wed Aug 02, 2020  1258 Spoke with Dr. Roosevelt Locks who will see and admit patient.   [GG]    Clinical Course User Index [GG] Reita Chard   MDM Rules/Calculators/A&P                          Discussed case with Dr. Donnetta Simpers, neurology.  She is outside the window for TPA and not a strong candidate for thrombectomy.  Will determine whether or not anticoagulation is warranted, patient to be admitted to hospitalist services.    CT head obtained without contrast was personally reviewed and negative for any acute intracranial abnormalities.  CT cerebral perfusion with contrast as well as angio of head and neck were obtained which demonstrate an acute occlusion involving distal aspect of right M1 segment with ischemia in the right MCA territory.  These findings are consistent with her contralateral hemiplegia.    While patient appears to be in atrial fibrillation that is new onset, hold off on anticoagulation given concern for ischemic to hemorrhagic conversion.  Neurology will determine whether or not anticoagulation warranted.  In the interim, will provide Cardizem infusion for rate control.  Her blood pressures are good, but want to hold off on bolus given context of ischemic stroke.  Labs notable for worsening leukocytosis compared to labs obtained 6 days ago.  Husband denies any symptoms suggestive of obvious source.  Will obtain plain films of chest and urine.    During her stay here in the ED, she developed acute onset pain and  discomfort in her right lower quadrant/periumbilical region.  She denied any back pain or need to defecate.  Her husband reports that she often will experience the symptoms of lower abdominal pain for which she takes prescribed narcotic medication at home.  She is also on twice daily proton pump inhibitors.  She has not had any of her daily medications which she believes could be contributing to her pain symptoms.  She is nontender on exam. I examined patient's buttocks region and there is no evidence of skin breakdown. Place the PureWick given patient's incontinence and her urine is concerning for infection. Will collect UA and culture.  Spoke with Dr. Roosevelt Locks who will see and admit patient.  Final Clinical Impression(s) / ED Diagnoses Final diagnoses:  Cerebrovascular accident (CVA) due to occlusion of left middle cerebral artery [facility])    Rx / DC Orders ED Discharge Orders    None       Corena Herter, PA-C 08/29/2020 1258    Corena Herter, PA-C 08/27/2020 1259    Charlesetta Shanks, MD 08/11/2020 1534

## 2020-08-03 ENCOUNTER — Ambulatory Visit: Payer: Medicare Other | Admitting: Cardiovascular Disease

## 2020-08-03 ENCOUNTER — Inpatient Hospital Stay (HOSPITAL_COMMUNITY): Payer: Medicare Other

## 2020-08-03 DIAGNOSIS — E8809 Other disorders of plasma-protein metabolism, not elsewhere classified: Secondary | ICD-10-CM

## 2020-08-03 DIAGNOSIS — I361 Nonrheumatic tricuspid (valve) insufficiency: Secondary | ICD-10-CM

## 2020-08-03 DIAGNOSIS — I712 Thoracic aortic aneurysm, without rupture, unspecified: Secondary | ICD-10-CM

## 2020-08-03 DIAGNOSIS — N390 Urinary tract infection, site not specified: Secondary | ICD-10-CM

## 2020-08-03 DIAGNOSIS — C44729 Squamous cell carcinoma of skin of left lower limb, including hip: Secondary | ICD-10-CM

## 2020-08-03 DIAGNOSIS — C180 Malignant neoplasm of cecum: Secondary | ICD-10-CM

## 2020-08-03 DIAGNOSIS — A419 Sepsis, unspecified organism: Secondary | ICD-10-CM

## 2020-08-03 DIAGNOSIS — E46 Unspecified protein-calorie malnutrition: Secondary | ICD-10-CM

## 2020-08-03 DIAGNOSIS — I340 Nonrheumatic mitral (valve) insufficiency: Secondary | ICD-10-CM

## 2020-08-03 DIAGNOSIS — I350 Nonrheumatic aortic (valve) stenosis: Secondary | ICD-10-CM

## 2020-08-03 LAB — URINALYSIS, ROUTINE W REFLEX MICROSCOPIC
Bilirubin Urine: NEGATIVE
Glucose, UA: NEGATIVE mg/dL
Hgb urine dipstick: NEGATIVE
Ketones, ur: NEGATIVE mg/dL
Nitrite: NEGATIVE
Protein, ur: 30 mg/dL — AB
Specific Gravity, Urine: >1.046 — ABNORMAL HIGH (ref 1.005–1.030)
WBC, UA: >50 WBC/hpf — ABNORMAL HIGH (ref 0–5)
pH: 5 (ref 5.0–8.0)

## 2020-08-03 LAB — GLUCOSE, CAPILLARY
Glucose-Capillary: 139 mg/dL — ABNORMAL HIGH (ref 70–99)
Glucose-Capillary: 153 mg/dL — ABNORMAL HIGH (ref 70–99)
Glucose-Capillary: 162 mg/dL — ABNORMAL HIGH (ref 70–99)

## 2020-08-03 LAB — I-STAT ARTERIAL BLOOD GAS, ED
Acid-Base Excess: 10 mmol/L — ABNORMAL HIGH (ref 0.0–2.0)
Bicarbonate: 36.6 mmol/L — ABNORMAL HIGH (ref 20.0–28.0)
Calcium, Ion: 1.1 mmol/L — ABNORMAL LOW (ref 1.15–1.40)
HCT: 33 % — ABNORMAL LOW (ref 36.0–46.0)
Hemoglobin: 11.2 g/dL — ABNORMAL LOW (ref 12.0–15.0)
O2 Saturation: 95 %
Patient temperature: 99.3
Potassium: 3.7 mmol/L (ref 3.5–5.1)
Sodium: 142 mmol/L (ref 135–145)
TCO2: 38 mmol/L — ABNORMAL HIGH (ref 22–32)
pCO2 arterial: 59.7 mmHg — ABNORMAL HIGH (ref 32.0–48.0)
pH, Arterial: 7.398 (ref 7.350–7.450)
pO2, Arterial: 83 mmHg (ref 83.0–108.0)

## 2020-08-03 LAB — CBC WITH DIFFERENTIAL/PLATELET
Abs Immature Granulocytes: 0 10*3/uL (ref 0.00–0.07)
Basophils Absolute: 0 10*3/uL (ref 0.0–0.1)
Basophils Relative: 0 %
Eosinophils Absolute: 0.3 10*3/uL (ref 0.0–0.5)
Eosinophils Relative: 1 %
HCT: 35.5 % — ABNORMAL LOW (ref 36.0–46.0)
Hemoglobin: 9.8 g/dL — ABNORMAL LOW (ref 12.0–15.0)
Lymphocytes Relative: 1 %
Lymphs Abs: 0.3 10*3/uL — ABNORMAL LOW (ref 0.7–4.0)
MCH: 27.5 pg (ref 26.0–34.0)
MCHC: 27.6 g/dL — ABNORMAL LOW (ref 30.0–36.0)
MCV: 99.4 fL (ref 80.0–100.0)
Monocytes Absolute: 4.4 10*3/uL — ABNORMAL HIGH (ref 0.1–1.0)
Monocytes Relative: 16 %
Neutro Abs: 22.3 10*3/uL — ABNORMAL HIGH (ref 1.7–7.7)
Neutrophils Relative %: 82 %
Platelets: 254 10*3/uL (ref 150–400)
RBC: 3.57 MIL/uL — ABNORMAL LOW (ref 3.87–5.11)
RDW: 15.9 % — ABNORMAL HIGH (ref 11.5–15.5)
WBC: 27.2 10*3/uL — ABNORMAL HIGH (ref 4.0–10.5)
nRBC: 0 % (ref 0.0–0.2)
nRBC: 0 /100 WBC

## 2020-08-03 LAB — COMPREHENSIVE METABOLIC PANEL
ALT: 31 U/L (ref 0–44)
AST: 19 U/L (ref 15–41)
Albumin: 3 g/dL — ABNORMAL LOW (ref 3.5–5.0)
Alkaline Phosphatase: 82 U/L (ref 38–126)
Anion gap: 14 (ref 5–15)
BUN: 17 mg/dL (ref 8–23)
CO2: 28 mmol/L (ref 22–32)
Calcium: 8.6 mg/dL — ABNORMAL LOW (ref 8.9–10.3)
Chloride: 100 mmol/L (ref 98–111)
Creatinine, Ser: 0.81 mg/dL (ref 0.44–1.00)
GFR calc Af Amer: >60 mL/min (ref >60)
GFR calc non Af Amer: >60 mL/min (ref >60)
Glucose, Bld: 153 mg/dL — ABNORMAL HIGH (ref 70–99)
Potassium: 4.4 mmol/L (ref 3.5–5.1)
Sodium: 142 mmol/L (ref 135–145)
Total Bilirubin: 1.2 mg/dL (ref 0.3–1.2)
Total Protein: 6.3 g/dL — ABNORMAL LOW (ref 6.5–8.1)

## 2020-08-03 LAB — BRAIN NATRIURETIC PEPTIDE: B Natriuretic Peptide: 2496.7 pg/mL — ABNORMAL HIGH (ref 0.0–100.0)

## 2020-08-03 LAB — ECHOCARDIOGRAM COMPLETE
AR max vel: 0.73 cm2
AV Area VTI: 0.7 cm2
AV Area mean vel: 0.72 cm2
AV Mean grad: 25.3 mmHg
AV Peak grad: 40 mmHg
Ao pk vel: 3.16 m/s
Area-P 1/2: 3.91 cm2
Height: 62 in
S' Lateral: 3.3 cm
Weight: 2400 oz

## 2020-08-03 LAB — LIPID PANEL
Cholesterol: 105 mg/dL (ref 0–200)
HDL: 38 mg/dL — ABNORMAL LOW (ref >40)
LDL Cholesterol: 44 mg/dL (ref 0–99)
Total CHOL/HDL Ratio: 2.8 RATIO
Triglycerides: 113 mg/dL (ref <150)
VLDL: 23 mg/dL (ref 0–40)

## 2020-08-03 LAB — HEMOGLOBIN A1C
Hgb A1c MFr Bld: 6.5 % — ABNORMAL HIGH (ref 4.8–5.6)
Mean Plasma Glucose: 139.85 mg/dL

## 2020-08-03 LAB — RAPID URINE DRUG SCREEN, HOSP PERFORMED
Amphetamines: NOT DETECTED
Barbiturates: NOT DETECTED
Benzodiazepines: NOT DETECTED
Cocaine: NOT DETECTED
Opiates: POSITIVE — AB
Tetrahydrocannabinol: NOT DETECTED

## 2020-08-03 LAB — MAGNESIUM: Magnesium: 2.1 mg/dL (ref 1.7–2.4)

## 2020-08-03 LAB — TSH: TSH: 2.409 u[IU]/mL (ref 0.350–4.500)

## 2020-08-03 MED ORDER — PERFLUTREN LIPID MICROSPHERE
1.0000 mL | INTRAVENOUS | Status: AC | PRN
  Administered 2020-08-03: 2 mL via INTRAVENOUS
  Filled 2020-08-03: qty 10

## 2020-08-03 MED ORDER — POLYETHYLENE GLYCOL 3350 17 G PO PACK: 17.0000 g | PACK | Freq: Every day | ORAL | Status: DC | PRN

## 2020-08-03 MED ORDER — GUAIFENESIN ER 600 MG PO TB12: 600.0000 mg | ORAL_TABLET | Freq: Two times a day (BID) | ORAL | Status: DC

## 2020-08-03 MED ORDER — FUROSEMIDE 10 MG/ML IJ SOLN
40.0000 mg | Freq: Two times a day (BID) | INTRAMUSCULAR | Status: DC
  Administered 2020-08-03 – 2020-08-04 (×3): 40 mg via INTRAVENOUS
  Filled 2020-08-03 (×3): qty 4

## 2020-08-03 MED ORDER — FUROSEMIDE 10 MG/ML IJ SOLN
40.0000 mg | Freq: Every day | INTRAMUSCULAR | Status: DC
  Administered 2020-08-03: 40 mg via INTRAVENOUS
  Filled 2020-08-03: qty 4

## 2020-08-03 MED ORDER — LEVALBUTEROL HCL 1.25 MG/0.5ML IN NEBU
1.2500 mg | INHALATION_SOLUTION | Freq: Two times a day (BID) | RESPIRATORY_TRACT | Status: DC
  Filled 2020-08-03: qty 0.5

## 2020-08-03 MED ORDER — SCOPOLAMINE 1 MG/3DAYS TD PT72
1.0000 | MEDICATED_PATCH | TRANSDERMAL | Status: DC
  Administered 2020-08-03: 1.5 mg via TRANSDERMAL
  Filled 2020-08-03: qty 1

## 2020-08-03 MED ORDER — LEVALBUTEROL HCL 1.25 MG/0.5ML IN NEBU
1.2500 mg | INHALATION_SOLUTION | Freq: Three times a day (TID) | RESPIRATORY_TRACT | Status: DC
  Administered 2020-08-03: 1.25 mg via RESPIRATORY_TRACT
  Filled 2020-08-03 (×2): qty 0.5

## 2020-08-03 MED ORDER — PANTOPRAZOLE SODIUM 40 MG IV SOLR
40.0000 mg | Freq: Every day | INTRAVENOUS | Status: DC
  Administered 2020-08-03 – 2020-08-04 (×2): 40 mg via INTRAVENOUS
  Filled 2020-08-03 (×2): qty 40

## 2020-08-03 MED ORDER — SODIUM CHLORIDE 0.9 % IV SOLN
1.0000 g | INTRAVENOUS | Status: DC
  Administered 2020-08-03 – 2020-08-04 (×2): 1 g via INTRAVENOUS
  Filled 2020-08-03 (×2): qty 10

## 2020-08-03 MED ORDER — IPRATROPIUM BROMIDE 0.02 % IN SOLN
0.5000 mg | Freq: Two times a day (BID) | RESPIRATORY_TRACT | Status: DC
  Administered 2020-08-03: 0.5 mg via RESPIRATORY_TRACT
  Filled 2020-08-03 (×2): qty 2.5

## 2020-08-03 MED ORDER — MORPHINE SULFATE (PF) 2 MG/ML IV SOLN
2.0000 mg | INTRAVENOUS | Status: DC | PRN
  Administered 2020-08-03 – 2020-08-04 (×3): 2 mg via INTRAVENOUS
  Filled 2020-08-03 (×3): qty 1

## 2020-08-03 MED ORDER — ALBUTEROL SULFATE (2.5 MG/3ML) 0.083% IN NEBU
2.5000 mg | INHALATION_SOLUTION | Freq: Four times a day (QID) | RESPIRATORY_TRACT | Status: DC
  Administered 2020-08-03 (×2): 2.5 mg via RESPIRATORY_TRACT
  Filled 2020-08-03 (×2): qty 3

## 2020-08-03 MED ORDER — LORAZEPAM 2 MG/ML IJ SOLN
0.5000 mg | Freq: Once | INTRAMUSCULAR | Status: AC
  Administered 2020-08-03: 0.5 mg via INTRAVENOUS
  Filled 2020-08-03: qty 1

## 2020-08-03 NOTE — Progress Notes (Signed)
PROGRESS NOTE    Tamara Gregory  CNO:[id number] DOB: August 31, 1925 DOA: 08/21/2020 PCP: Binnie Rail, MD   Brief Narrative:  86 year old female with history of CHF EF 35%, chronic hypoxia on 2 L nasal cannula, worsening dementia, colon cancer status post partial colectomy, metastatic lung disease, osteoarthritis on chronic prednisone, hypothyroidism was brought to the the hospital with husband due to progressive decline and failure to thrive at home along with shortness of breath and confusion.  Upon admission she was found to have acute CVA with ICA thrombosis, atrial fibrillation with RVR, CHF exacerbation and urinary tract infection.   Assessment & Plan:   Principal Problem:   CVA (cerebral vascular accident) [facility]) Active Problems:   Depression   Essential hypertension, benign   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   Acute lower UTI   Hypoalbuminemia due to protein-calorie malnutrition ([place])   Exudative age-related macular degeneration (HCC)   invasive SCC (squamous cell carcinoma), leg, left   CAD (coronary artery disease)   PAF (paroxysmal atrial fibrillation) ([place])   Acute right MCA infarct with occlusion of right distal M1 -Unable to aggressively treat this given her multiple comorbidities.  Not a good candidate for thrombectomy at this time.  Antiplatelet therapy per neurology service. -A1c-6.5, LDL 44 -Supportive care -Echocardiogram-been performed, results pending -Neurochecks  Acute respiratory distress secondary to congestive heart failure with reduced ejection fraction, EF 35%, class IV Acute on chronic hypoxia, currently on 6 L nasal cannula -Patient appears to be in volume overload with elevated BNP -Lasix 40 mg IV twice daily, monitor urine output -Echocardiogram ordered -Supplemental oxygen as needed -Cardiology following. -Check procalcitonin levels  Atrial fibrillation with RVR -Currently rate controlled.  Started on digoxin.  Sepsis  present on admission as evidenced by tachycardia and leukocytosis. Urinary tract infection without hematuria -Cultures have been ordered -Start empiric IV Rocephin  Acute metabolic encephalopathy -Suspect secondary to underlying CVA and urinary tract infection.  Management as mentioned above -If her p.o. intake remains inconsistent, perform Accu-Cheks every 6 hours. -Check ammonia, TSH-normal -Check ABG  History of colon cancer status post colectomy with suspicions of pulmonary nodule -Follows outpatient oncology team. -Change bronchodilators from albuterol to Xopenex and ipratropium  Adult failure to thrive with moderate protein calorie malnutrition -Currently n.p.o. due to her mentation.  Oral diet as tolerated  Chronic adrenal insufficiency She is on chronic steroids therefore continue hydrocortisone 50 mg every 8 hours for stress dose.  Goals of care discussion -Extensively discussed with the patient's husband, he is aware that she has very poor prognosis at this time and aggressive management is undesirable.  Palliative care team has been consulted.    DVT prophylaxis: Lovenox Code Status: DNR Family Communication: Husband at bedside  Status is: Inpatient  Remains inpatient appropriate because:Hemodynamically unstable   Dispo: The patient is from: Home              Anticipated d/c is to: SNF              Anticipated d/c date is: > 3 days              Patient currently is not medically stable to d/c.  Patient is critically ill with multiple ongoing issues, unstable for discharge at this time.  Maintain hospital stay for multiple ongoing issues.       Body mass index is 27.44 kg/m.    Subjective: Patient seen and examined at bedside, difficult to arouse with bilateral abnormal breath  sounds.  She follows very basic commands such as when asked to squeeze my fingers she was able to.  Upon sternal rub she was able to grossly move her extremities.  Husband present  at bedside-had extensive discussion with him regarding her current condition and guarded prognosis.  All the questions were answered.  Review of Systems Otherwise negative except as per HPI, including: Difficult to obtain given her mentation  Examination:  Constitutional: Patient appears ill Respiratory: Bilateral diffuse coarse breath sounds Cardiovascular: Normal sinus rhythm, no rubs Abdomen: Nontender nondistended good bowel sounds Musculoskeletal: 1+ b/l LE pitting edema.  Skin: No rashes seen Neurologic: Responds to physical stimuli and grossly moves all the extremities but overall difficult to perform full neurologic exam as she does not follow all the commands. Psychiatric: Poor mentation  Objective: Vitals:   08/03/20 0854 08/03/20 0900 08/03/20 0915 08/03/20 1006  BP: 126/79 115/79 137/88 (!) 143/81  Pulse: 93 95 (!) 104 88  Resp: (!) 21 (!) 26 (!) 26 20  Temp:      TempSrc:      SpO2: (!) 89% 92% 94% 96%  Weight:      Height:        Intake/Output Summary (Last 24 hours) at 08/03/2020 1032 Last data filed at 08/03/2020 1008 Gross per 24 hour  Intake 839.37 ml  Output --  Net 839.37 ml   Filed Weights   08/21/2020 1211  Weight: 68 kg     Data Reviewed:   CBC: Recent Labs  Lab 08/25/2020 0918 08/03/2020 0919 08/03/20 0248  WBC  --  19.6* 27.2*  NEUTROABS  --  13.1* 22.3*  HGB 11.2* 9.9* 9.8*  HCT 33.0* 33.7* 35.5*  MCV  --  95.2 99.4  PLT  --  273 923   Basic Metabolic Panel: Recent Labs  Lab 08/04/2020 0918 08/29/2020 0919 08/03/20 0248  NA 136 137 142  K 4.5 4.1 4.4  CL 91* 90* 100  CO2  --  32 28  GLUCOSE 156* 154* 153*  BUN 34* 24* 17  CREATININE 0.90 1.07* 0.81  CALCIUM  --  9.1 8.6*  MG  --   --  2.1   GFR: Estimated Creatinine Clearance: 39.3 mL/min (by C-G formula based on SCr of 0.81 mg/dL). Liver Function Tests: Recent Labs  Lab 08/10/2020 0919 08/03/20 0248  AST 37 19  ALT 33 31  ALKPHOS 83 82  BILITOT 1.3* 1.2  PROT 6.7 6.3*   ALBUMIN 3.2* 3.0*   No results for input(s): LIPASE, AMYLASE in the last 168 hours. No results for input(s): AMMONIA in the last 168 hours. Coagulation Profile: Recent Labs  Lab 08/23/2020 0919  INR 1.3*   Cardiac Enzymes: No results for input(s): CKTOTAL, CKMB, CKMBINDEX, TROPONINI in the last 168 hours. BNP (last 3 results) Recent Labs    03/27/20 1529 05/19/20 1518 06/28/20 1048  PROBNP 3,893* 1,426.0* 968.0*   HbA1C: Recent Labs    08/03/20 0248  HGBA1C 6.5*   CBG: No results for input(s): GLUCAP in the last 168 hours. Lipid Profile: Recent Labs    08/03/20 0248  CHOL 105  HDL 38*  LDLCALC 44  TRIG 113  CHOLHDL 2.8   Thyroid Function Tests: Recent Labs    08/03/20 0248  TSH 2.409   Anemia Panel: No results for input(s): VITAMINB12, FOLATE, FERRITIN, TIBC, IRON, RETICCTPCT in the last 72 hours. Sepsis Labs: No results for input(s): PROCALCITON, LATICACIDVEN in the last 168 hours.  Recent Results (from the past  240 hour(s))  SARS Coronavirus 2 by RT PCR (hospital order, performed in [facility] hospital lab) Nasopharyngeal Nasopharyngeal Swab     Status: None   Collection Time: 08/04/2020 10:19 AM   Specimen: Nasopharyngeal Swab  Result Value Ref Range Status   SARS Coronavirus 2 NEGATIVE NEGATIVE Final    Comment: (NOTE) SARS-CoV-2 target nucleic acids are NOT DETECTED.  The SARS-CoV-2 RNA is generally detectable in upper and lower respiratory specimens during the acute phase of infection. The lowest concentration of SARS-CoV-2 viral copies this assay can detect is 250 copies / mL. A negative result does not preclude SARS-CoV-2 infection and should not be used as the sole basis for treatment or other patient management decisions.  A negative result may occur with improper specimen collection / handling, submission of specimen other than nasopharyngeal swab, presence of viral mutation(s) within the areas targeted by this assay, and inadequate number  of viral copies (<250 copies / mL). A negative result must be combined with clinical observations, patient history, and epidemiological information.  Fact Sheet for Patients:   StrictlyIdeas.no  Fact Sheet for Healthcare Providers: BankingDealers.co.za  This test is not yet approved or  cleared by the [place] FDA and has been authorized for detection and/or diagnosis of SARS-CoV-2 by FDA under an Emergency Use Authorization (EUA).  This EUA will remain in effect (meaning this test can be used) for the duration of the COVID-19 declaration under Section 564(b)(1) of the Act, 21 U.S.C. section 360bbb-3(b)(1), unless the authorization is terminated or revoked sooner.  Performed at [facility], [place] [address], [place], [place] [address]          Radiology Studies: CT CEREBRAL PERFUSION W CONTRAST  Result Date: 08/04/2020 CLINICAL DATA:  Stroke.  Left-sided weakness and slurred speech EXAM: CT ANGIOGRAPHY HEAD AND NECK CT PERFUSION BRAIN TECHNIQUE: Multidetector CT imaging of the head and neck was performed using the standard protocol during bolus administration of intravenous contrast. Multiplanar CT image reconstructions and MIPs were obtained to evaluate the vascular anatomy. Carotid stenosis measurements (when applicable) are obtained utilizing NASCET criteria, using the distal internal carotid diameter as the denominator. Multiphase CT imaging of the brain was performed following IV bolus contrast injection. Subsequent parametric perfusion maps were calculated using RAPID software. CONTRAST:  162mL OMNIPAQUE IOHEXOL 350 MG/ML SOLN COMPARISON:  CT head 08/15/2020 FINDINGS: CTA NECK FINDINGS Aortic arch: Atherosclerotic calcification throughout the aortic arch. Ascending aorta measures 42 mm in diameter. Descending thoracic aorta 26 mm in diameter. Negative for dissection. Eccentric plaque or thrombus of the distal arch measuring  approximately 2 cm. This is distal to the left subclavian artery. Proximal great vessels patent without significant stenosis. Mild atherosclerotic disease at the origin the left common carotid and left subclavian artery. Right carotid system: Atherosclerotic calcification right carotid bifurcation. Less than 50% diameter stenosis proximal right internal carotid artery. Left carotid system: Atherosclerotic calcification in the carotid bulb. Adjacent to the calcification, there is a low-density filling defect in the lumen attached to the wall. This could be thrombus or soft plaque. Less than 50% diameter stenosis proximal left internal carotid artery. Kinking of the left internal carotid artery just above the plaque. Vertebral arteries: Both vertebral arteries patent to the basilar without stenosis. Skeleton: Anterolisthesis C3-4 and C4-5. 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 Disc degeneration and spurring C6-7. 4 mm anterolisthesis C7-T1 which appears degenerative. No acute skeletal abnormality. Other neck: Negative for mass or adenopathy. Upper chest: Widespread patchy small areas of infiltrate throughout the  lungs bilaterally. Small bilateral pleural effusions. Review of the MIP images confirms the above findings CTA HEAD FINDINGS Anterior circulation: Occlusion right M1 segment distally. There is flow in the anterior and superior branches of the right middle cerebral artery due to collaterals with decreased opacification compared to the contralateral side. Both anterior cerebral arteries widely patent. Left middle cerebral artery widely patent without stenosis. Posterior circulation: Both vertebral arteries patent to the basilar. PICA patent on the right but not left. AICA, superior cerebellar, posterior cerebral arteries patent bilaterally without stenosis. Basilar patent without stenosis. Patent posterior communicating artery on the left. Venous sinuses: Normal venous enhancement. Anatomic variants: None Review of the MIP  images confirms the above findings CT Brain Perfusion Findings: ASPECTS: 10 CBF (<30%) Volume: 43mL Perfusion (Tmax>6.0s) volume: 72mL Mismatch Volume: 25mL Infarction Location:Right MCA territory involving the temporal and parietal lobe. IMPRESSION: 1. CT perfusion positive for 98 mL ischemia in the right MCA territory involving the temporal and parietal lobe. No core infarct on CT perfusion. 2. Acute occlusion right distal M1 segment. There is collateral flow in the right superior and inferior division of the MCA. 3. Less than 50% diameter stenosis proximal right internal carotid artery due to calcific plaque 4. Atherosclerotic calcification left carotid bulb. Associated intraluminal filling defect which may be thrombus or soft plaque. Less than 50% diameter stenosis proximal left internal carotid artery. 5. Both vertebral arteries patent to the basilar 6. Aortic Atherosclerosis (ICD10-I70.0). Aortic aneurysm. Ascending aorta 42 mm in diameter. 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 Recommend annual imaging followup by CTA or MRA. This recommendation follows 2010 ACCF/AHA/AATS/ACR/ASA/SCA/SCAI/SIR/STS/SVM Guidelines for the Diagnosis and Management of Patients with Thoracic Aortic Disease. Circulation. 2010; 121: K481-E563. Aortic aneurysm NOS (ICD10-I71.9) 7. These results were called by telephone at the time of interpretation on 08/04/2020 at 9:54 am to provider [facility] , who verbally acknowledged these results. 8. Extensive patchy airspace disease bilaterally. Correlate with COVID testing. Small pleural effusions. Electronically Signed   By: Franchot Gallo M.D.   On: 08/15/2020 09:56   DG Chest Portable 1 View  Result Date: 08/15/2020 CLINICAL DATA:  Shortness of breath, leukocytosis EXAM: PORTABLE CHEST 1 VIEW COMPARISON:  03/27/2020 FINDINGS: Stable cardiomegaly. Known thoracic aortic aneurysm with extensive atherosclerosis. Patchy airspace opacity at  the left lung base with small left pleural effusion. There are prominent interstitial opacities throughout both lungs. No pneumothorax. Spinal stimulator leads within the thoracic spine are again noted. IMPRESSION: 1. Patchy airspace opacity at the left lung base with small left pleural effusion, concerning for pneumonia. 2. Prominent interstitial opacities throughout both lungs may represent edema versus atypical/viral infection. Electronically Signed   By: Davina Poke D.O.   On: 08/31/2020 10:48   CT HEAD CODE STROKE WO CONTRAST  Result Date: 08/03/2020 CLINICAL DATA:  Code stroke.  Flaccid left side EXAM: CT HEAD WITHOUT CONTRAST TECHNIQUE: Contiguous axial images were obtained from the base of the skull through the vertex without intravenous contrast. COMPARISON:  03/10/2019 FINDINGS: Brain: No evidence of acute infarction, hemorrhage, hydrocephalus, extra-axial collection or mass lesion/mass effect. Atrophy and chronic small vessel ischemia in keeping with age. Vascular: No hyperdense vessel or unexpected calcification. Skull: Normal. Negative for fracture or focal lesion. Sinuses/Orbits: Bilateral cataract resection Other: These results were communicated to [facility] at 9:23 amon 9/1/2021by text page via the [facility] messaging system. ASPECTS [facility] Stroke Program Early CT Score) - Ganglionic level infarction (caudate, lentiform nuclei, internal capsule, insula, M1-M3 cortex): 7 -  Supraganglionic infarction (M4-M6 cortex): 3 Total score (0-10 with 10 being normal): 10 IMPRESSION: 1. Aged brain without acute finding. 2. ASPECTS is 10.  No visible thalamic infarct. Electronically Signed   By: Monte Fantasia M.D.   On: 08/22/2020 09:25   CT ANGIO HEAD CODE STROKE  Result Date: 08/06/2020 CLINICAL DATA:  Stroke.  Left-sided weakness and slurred speech EXAM: CT ANGIOGRAPHY HEAD AND NECK CT PERFUSION BRAIN TECHNIQUE: Multidetector CT imaging of the head and neck was performed using the standard protocol  during bolus administration of intravenous contrast. Multiplanar CT image reconstructions and MIPs were obtained to evaluate the vascular anatomy. Carotid stenosis measurements (when applicable) are obtained utilizing NASCET criteria, using the distal internal carotid diameter as the denominator. Multiphase CT imaging of the brain was performed following IV bolus contrast injection. Subsequent parametric perfusion maps were calculated using RAPID software. CONTRAST:  123mL OMNIPAQUE IOHEXOL 350 MG/ML SOLN COMPARISON:  CT head 08/13/2020 FINDINGS: CTA NECK FINDINGS Aortic arch: Atherosclerotic calcification throughout the aortic arch. Ascending aorta measures 42 mm in diameter. Descending thoracic aorta 26 mm in diameter. Negative for dissection. Eccentric plaque or thrombus of the distal arch measuring approximately 2 cm. This is distal to the left subclavian artery. Proximal great vessels patent without significant stenosis. Mild atherosclerotic disease at the origin the left common carotid and left subclavian artery. Right carotid system: Atherosclerotic calcification right carotid bifurcation. Less than 50% diameter stenosis proximal right internal carotid artery. Left carotid system: Atherosclerotic calcification in the carotid bulb. Adjacent to the calcification, there is a low-density filling defect in the lumen attached to the wall. This could be thrombus or soft plaque. Less than 50% diameter stenosis proximal left internal carotid artery. Kinking of the left internal carotid artery just above the plaque. Vertebral arteries: Both vertebral arteries patent to the basilar without stenosis. Skeleton: Anterolisthesis C3-4 and C4-5. 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 Disc degeneration and spurring C6-7. 4 mm anterolisthesis C7-T1 which appears degenerative. No acute skeletal abnormality. Other neck: Negative for mass or adenopathy. Upper chest: Widespread patchy small areas of infiltrate throughout the lungs bilaterally.  Small bilateral pleural effusions. Review of the MIP images confirms the above findings CTA HEAD FINDINGS Anterior circulation: Occlusion right M1 segment distally. There is flow in the anterior and superior branches of the right middle cerebral artery due to collaterals with decreased opacification compared to the contralateral side. Both anterior cerebral arteries widely patent. Left middle cerebral artery widely patent without stenosis. Posterior circulation: Both vertebral arteries patent to the basilar. PICA patent on the right but not left. AICA, superior cerebellar, posterior cerebral arteries patent bilaterally without stenosis. Basilar patent without stenosis. Patent posterior communicating artery on the left. Venous sinuses: Normal venous enhancement. Anatomic variants: None Review of the MIP images confirms the above findings CT Brain Perfusion Findings: ASPECTS: 10 CBF (<30%) Volume: 52mL Perfusion (Tmax>6.0s) volume: 93mL Mismatch Volume: 56mL Infarction Location:Right MCA territory involving the temporal and parietal lobe. IMPRESSION: 1. CT perfusion positive for 98 mL ischemia in the right MCA territory involving the temporal and parietal lobe. No core infarct on CT perfusion. 2. Acute occlusion right distal M1 segment. There is collateral flow in the right superior and inferior division of the MCA. 3. Less than 50% diameter stenosis proximal right internal carotid artery due to calcific plaque 4. Atherosclerotic calcification left carotid bulb. Associated intraluminal filling defect which may be thrombus or soft plaque. Less than 50% diameter stenosis proximal left internal carotid artery. 5. Both vertebral arteries patent to the  basilar 6. Aortic Atherosclerosis (ICD10-I70.0). Aortic aneurysm. Ascending aorta 42 mm in diameter. 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 Recommend annual imaging followup by CTA or MRA. This recommendation follows 2010  ACCF/AHA/AATS/ACR/ASA/SCA/SCAI/SIR/STS/SVM Guidelines for the Diagnosis and Management of Patients with Thoracic Aortic Disease. Circulation. 2010; 121: Z610-R604. Aortic aneurysm NOS (ICD10-I71.9) 7. These results were called by telephone at the time of interpretation on 08/06/2020 at 9:54 am to provider [facility] , who verbally acknowledged these results. 8. Extensive patchy airspace disease bilaterally. Correlate with COVID testing. Small pleural effusions. Electronically Signed   By: Franchot Gallo M.D.   On: 08/14/2020 09:56   CT ANGIO NECK CODE STROKE  Result Date: 08/04/2020 CLINICAL DATA:  Stroke.  Left-sided weakness and slurred speech EXAM: CT ANGIOGRAPHY HEAD AND NECK CT PERFUSION BRAIN TECHNIQUE: Multidetector CT imaging of the head and neck was performed using the standard protocol during bolus administration of intravenous contrast. Multiplanar CT image reconstructions and MIPs were obtained to evaluate the vascular anatomy. Carotid stenosis measurements (when applicable) are obtained utilizing NASCET criteria, using the distal internal carotid diameter as the denominator. Multiphase CT imaging of the brain was performed following IV bolus contrast injection. Subsequent parametric perfusion maps were calculated using RAPID software. CONTRAST:  147mL OMNIPAQUE IOHEXOL 350 MG/ML SOLN COMPARISON:  CT head 08/22/2020 FINDINGS: CTA NECK FINDINGS Aortic arch: Atherosclerotic calcification throughout the aortic arch. Ascending aorta measures 42 mm in diameter. Descending thoracic aorta 26 mm in diameter. Negative for dissection. Eccentric plaque or thrombus of the distal arch measuring approximately 2 cm. This is distal to the left subclavian artery. Proximal great vessels patent without significant stenosis. Mild atherosclerotic disease at the origin the left common carotid and left subclavian artery. Right carotid system: Atherosclerotic calcification right carotid bifurcation. Less than 50%  diameter stenosis proximal right internal carotid artery. Left carotid system: Atherosclerotic calcification in the carotid bulb. Adjacent to the calcification, there is a low-density filling defect in the lumen attached to the wall. This could be thrombus or soft plaque. Less than 50% diameter stenosis proximal left internal carotid artery. Kinking of the left internal carotid artery just above the plaque. Vertebral arteries: Both vertebral arteries patent to the basilar without stenosis. Skeleton: Anterolisthesis C3-4 and C4-5. 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 Disc degeneration and spurring C6-7. 4 mm anterolisthesis C7-T1 which appears degenerative. No acute skeletal abnormality. Other neck: Negative for mass or adenopathy. Upper chest: Widespread patchy small areas of infiltrate throughout the lungs bilaterally. Small bilateral pleural effusions. Review of the MIP images confirms the above findings CTA HEAD FINDINGS Anterior circulation: Occlusion right M1 segment distally. There is flow in the anterior and superior branches of the right middle cerebral artery due to collaterals with decreased opacification compared to the contralateral side. Both anterior cerebral arteries widely patent. Left middle cerebral artery widely patent without stenosis. Posterior circulation: Both vertebral arteries patent to the basilar. PICA patent on the right but not left. AICA, superior cerebellar, posterior cerebral arteries patent bilaterally without stenosis. Basilar patent without stenosis. Patent posterior communicating artery on the left. Venous sinuses: Normal venous enhancement. Anatomic variants: None Review of the MIP images confirms the above findings CT Brain Perfusion Findings: ASPECTS: 10 CBF (<30%) Volume: 68mL Perfusion (Tmax>6.0s) volume: 76mL Mismatch Volume: 33mL Infarction Location:Right MCA territory involving the temporal and parietal lobe. IMPRESSION: 1. CT perfusion positive for 98 mL ischemia in the right MCA  territory involving the temporal and parietal lobe. No core infarct on CT perfusion.  2. Acute occlusion right distal M1 segment. There is collateral flow in the right superior and inferior division of the MCA. 3. Less than 50% diameter stenosis proximal right internal carotid artery due to calcific plaque 4. Atherosclerotic calcification left carotid bulb. Associated intraluminal filling defect which may be thrombus or soft plaque. Less than 50% diameter stenosis proximal left internal carotid artery. 5. Both vertebral arteries patent to the basilar 6. Aortic Atherosclerosis (ICD10-I70.0). Aortic aneurysm. Ascending aorta 42 mm in diameter. 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 Recommend annual imaging followup by CTA or MRA. This recommendation follows 2010 ACCF/AHA/AATS/ACR/ASA/SCA/SCAI/SIR/STS/SVM Guidelines for the Diagnosis and Management of Patients with Thoracic Aortic Disease. Circulation. 2010; 121: W446-K863. Aortic aneurysm NOS (ICD10-I71.9) 7. These results were called by telephone at the time of interpretation on 08/26/2020 at 9:54 am to provider [facility] , who verbally acknowledged these results. 8. Extensive patchy airspace disease bilaterally. Correlate with COVID testing. Small pleural effusions. Electronically Signed   By: Franchot Gallo M.D.   On: 08/29/2020 09:56        Scheduled Meds: .  stroke: mapping our early stages of recovery book   Does not apply Once  . albuterol  2.5 mg Nebulization Q6H  . aspirin  324 mg Oral Daily  . atorvastatin  80 mg Oral Daily  . brimonidine  1 drop Both Eyes TID  . clopidogrel  75 mg Oral Daily  . digoxin  0.25 mg Intravenous Q6H  . enoxaparin (LOVENOX) injection  30 mg Subcutaneous Q24H  . fluticasone  1 spray Each Nare QHS  . furosemide  40 mg Intravenous BID  . guaiFENesin  600 mg Oral BID  . hydrocortisone sod succinate (SOLU-CORTEF) inj  50 mg Intravenous Q8H  . latanoprost  1 drop  Left Eye QHS  . pantoprazole (PROTONIX) IV  40 mg Intravenous Daily  . sertraline  100 mg Oral Daily   Continuous Infusions: . sodium chloride Stopped (08/03/20 0909)  . cefTRIAXone (ROCEPHIN)  IV Stopped (08/03/20 1008)     LOS: 1 day   Time spent= 35 mins    Rhylen Pulido Arsenio Loader, MD [facility]  If 7PM-7AM, please contact night-coverage  08/03/2020, 10:32 AM

## 2020-08-03 NOTE — ED Notes (Signed)
Admitting doctor called  She will some and see the pt

## 2020-08-03 NOTE — Progress Notes (Signed)
Pt anxious during shift change report, pt on non-re breather at 15L and oxygenation in the low 90's and 80's. MD notified and new orders received and MD instructed RN to notify RT to assess pt. RT on unit notified and report given as well. Will continue to closely monitor pt. Delia Heady RN

## 2020-08-03 NOTE — Progress Notes (Addendum)
STROKE TEAM PROGRESS NOTE   INTERVAL HISTORY Patient in bed, no family at bedside. Patient on non-rebreather with 02 SATS 93-94%. Patient is unresponsive and has dense left hemiplegia and does move right side spontaneously.  No family available at the bedside. Vitals:   08/03/20 1400 08/03/20 1415 08/03/20 1425 08/03/20 1450  BP: 137/83 121/81    Pulse: 89 99  94  Resp: 19 (!) 23    Temp:   98.9 F (37.2 C)   TempSrc:   Axillary Axillary  SpO2: 97% 97%    Weight:      Height:       CBC:  Recent Labs  Lab 08/11/2020 0919 08/03/2020 0919 08/03/20 0248 08/03/20 1116  WBC 19.6*  --  27.2*  --   NEUTROABS 13.1*  --  22.3*  --   HGB 9.9*   < > 9.8* 11.2*  HCT 33.7*   < > 35.5* 33.0*  MCV 95.2  --  99.4  --   PLT 273  --  254  --    < > = values in this interval not displayed.   Basic Metabolic Panel:  Recent Labs  Lab 08/09/2020 0919 08/07/2020 0919 08/03/20 0248 08/03/20 1116  NA 137   < > 142 142  K 4.1   < > 4.4 3.7  CL 90*  --  100  --   CO2 32  --  28  --   GLUCOSE 154*  --  153*  --   BUN 24*  --  17  --   CREATININE 1.07*  --  0.81  --   CALCIUM 9.1  --  8.6*  --   MG  --   --  2.1  --    < > = values in this interval not displayed.   Lipid Panel:  Recent Labs  Lab 08/03/20 0248  CHOL 105  TRIG 113  HDL 38*  CHOLHDL 2.8  VLDL 23  LDLCALC 44   HgbA1c:  Recent Labs  Lab 08/03/20 0248  HGBA1C 6.5*   Urine Drug Screen:  Recent Labs  Lab 08/03/20 0230  LABOPIA POSITIVE*  COCAINSCRNUR NONE DETECTED  LABBENZ NONE DETECTED  AMPHETMU NONE DETECTED  THCU NONE DETECTED  LABBARB NONE DETECTED    Alcohol Level  Recent Labs  Lab 08/11/2020 0919  ETH <10    IMAGING past 24 hours ECHOCARDIOGRAM COMPLETE  Result Date: 08/03/2020    ECHOCARDIOGRAM REPORT   Patient Name:   Tamara Gregory Date of Exam: 08/03/2020 Medical Rec #:  [id number]      Height:       62.0 in Accession #:    [id number]     Weight:       150.0 lb Date of Birth:  05-17-1925      BSA:           1.692 m Patient Age:    86 years       BP:           123/86 mmHg Patient Gender: F              HR:           98 bpm. Exam Location:  Inpatient Procedure: 2D Echo, Cardiac Doppler, Color Doppler and Intracardiac            Opacification Agent Indications:    TIA 435.09 / G45.96  History:        Patient has prior history of Echocardiogram examinations, most  recent 05/11/2019. Risk Factors:Hypertension and Dyslipidemia.                 GERD. Cancer.  Sonographer:    Jonelle Sidle Dance Referring Phys: [id number] [place]  1. Apex, septum and mid ventricular function down. Basal function preserved . Left ventricular ejection fraction, by estimation, is 30 to 35%. The left ventricle has moderately decreased function. The left ventricle demonstrates global hypokinesis. The left ventricular internal cavity size was mildly to moderately dilated. Left ventricular diastolic parameters are indeterminate.  2. Right ventricular systolic function is normal. The right ventricular size is normal. There is moderately elevated pulmonary artery systolic pressure.  3. Left atrial size was mildly dilated.  4. The mitral valve is degenerative. Mild mitral valve regurgitation. No evidence of mitral stenosis.  5. Tricuspid valve regurgitation is mild to moderate.  6. Severe low flow AS AVA 0.72 cm2 and DVI 0.25. The aortic valve is tricuspid. Aortic valve regurgitation is trivial. Severe aortic valve stenosis.  7. Aortic dilatation noted. There is moderate dilatation of the ascending aorta measuring 44 mm.  8. The inferior vena cava is normal in size with greater than 50% respiratory variability, suggesting right atrial pressure of 3 mmHg. FINDINGS  Left Ventricle: Apex, septum and mid ventricular function down. Basal function preserved. Left ventricular ejection fraction, by estimation, is 30 to 35%. The left ventricle has moderately decreased function. The left ventricle demonstrates global hypokinesis. Definity  contrast agent was given IV to delineate the left ventricular endocardial borders. The left ventricular internal cavity size was mildly to moderately dilated. There is no left ventricular hypertrophy. Left ventricular diastolic parameters are indeterminate. Right Ventricle: The right ventricular size is normal. No increase in right ventricular wall thickness. Right ventricular systolic function is normal. There is moderately elevated pulmonary artery systolic pressure. The tricuspid regurgitant velocity is 2.96 m/s, and with an assumed right atrial pressure of 15 mmHg, the estimated right ventricular systolic pressure is 65.6 mmHg. Left Atrium: Left atrial size was mildly dilated. Right Atrium: Right atrial size was normal in size. Pericardium: Trivial pericardial effusion is present. The pericardial effusion is posterior and lateral to the left ventricle. Mitral Valve: The mitral valve is degenerative in appearance. There is moderate thickening of the mitral valve leaflet(s). There is moderate calcification of the mitral valve leaflet(s). Normal mobility of the mitral valve leaflets. Moderate mitral annular calcification. Mild mitral valve regurgitation. No evidence of mitral valve stenosis. Tricuspid Valve: The tricuspid valve is normal in structure. Tricuspid valve regurgitation is mild to moderate. No evidence of tricuspid stenosis. Aortic Valve: Severe low flow AS AVA 0.72 cm2 and DVI 0.25. The aortic valve is tricuspid. . There is severe thickening and severe calcifcation of the aortic valve. Aortic valve regurgitation is trivial. Severe aortic stenosis is present. Severe aortic valve annular calcification. There is severe thickening of the aortic valve. There is severe calcifcation of the aortic valve. Aortic valve mean gradient measures 25.3 mmHg. Aortic valve peak gradient measures 40.0 mmHg. Aortic valve area, by VTI measures 0.70 cm. Pulmonic Valve: The pulmonic valve was normal in structure. Pulmonic  valve regurgitation is not visualized. No evidence of pulmonic stenosis. Aorta: The aortic root is normal in size and structure and aortic dilatation noted. There is moderate dilatation of the ascending aorta measuring 44 mm. Venous: The inferior vena cava is normal in size with greater than 50% respiratory variability, suggesting right atrial pressure of 3 mmHg. IAS/Shunts: No atrial level shunt detected by  color flow Doppler.  LEFT VENTRICLE PLAX 2D LVIDd:         4.40 cm LVIDs:         3.30 cm LV PW:         1.10 cm LV IVS:        1.00 cm LVOT diam:     1.90 cm LV SV:         44 LV SV Index:   26 LVOT Area:     2.84 cm  RIGHT VENTRICLE          IVC RV Basal diam:  2.30 cm  IVC diam: 2.20 cm TAPSE (M-mode): 1.2 cm LEFT ATRIUM             Index       RIGHT ATRIUM           Index LA diam:        4.20 cm 2.48 cm/m  RA Area:     18.50 cm LA Vol (A2C):   93.7 ml 55.39 ml/m RA Volume:   48.00 ml  28.37 ml/m LA Vol (A4C):   74.4 ml 43.98 ml/m LA Biplane Vol: 89.4 ml 52.85 ml/m  AORTIC VALVE AV Area (Vmax):    0.73 cm AV Area (Vmean):   0.72 cm AV Area (VTI):     0.70 cm AV Vmax:           316.33 cm/s AV Vmean:          240.667 cm/s AV VTI:            0.631 m AV Peak Grad:      40.0 mmHg AV Mean Grad:      25.3 mmHg LVOT Vmax:         80.90 cm/s LVOT Vmean:        61.150 cm/s LVOT VTI:          0.156 m LVOT/AV VTI ratio: 0.25  AORTA Ao Root diam: 3.10 cm Ao Asc diam:  4.40 cm MITRAL VALVE                TRICUSPID VALVE MV Area (PHT): 3.91 cm     TR Peak grad:   35.0 mmHg MV Decel Time: 194 msec     TR Vmax:        296.00 cm/s MV E velocity: 152.00 cm/s MV A velocity: 61.60 cm/s   SHUNTS MV E/A ratio:  2.47         Systemic VTI:  0.16 m                             Systemic Diam: 1.90 cm Jenkins Rouge MD Electronically signed by Jenkins Rouge MD Signature Date/Time: 08/03/2020/12:48:38 PM    Final     PHYSICAL EXAM Frail cachectic malnourished looking elderly lady in mild respiratory distress on nonrebreather  oxygen. . Afebrile. Head is nontraumatic. Neck is supple without bruit.    Cardiac exam no murmur or gallop. Lungs are clear to auscultation. Distal pulses are well felt. Neurological Exam : She is lethargic but does open eyes and tries to speak and mumbles but speech is mostly nonsensical and difficult to understand.  Right gaze preference and not able to look to the left past midline.  She blinks to threat more on the right than the left.  Left facial weakness.  Tongue midline.  Motor system exam shows spontaneous antigravity movements in the right upper  and lower extremity but she has dense left hemiplegia with hypotonia.  Trace withdrawal in the left lower extremity to pain but none in the left upper extremity.  Left plantar upgoing right downgoing.  Gait not tested. ASSESSMENT/PLAN Ms. GENAVIE BOETTGER is a 86 y.o. female  with PMH significant for Anemia, colon cancer s/p R colectomy, GERD, HLD, HTN, Migraine, Myasthenia, venous stasis dermatitis who presents with acute onset Left sided weakness. Husband reports that she went to bed at 2200 on 08/01/20 and in the AM, was trying to wake him up and wanting to get out of bed. She is bed and wheel chair bound at baseline and needs helkp with transfer. He got out of bed at 7am to get her to move but noted that the patient could not sit up. She appeared off. He called EMS and she was noted to be leaning to her left side. She was brought in as a stroke code. She was noted to have afib on rhythm strip during transport per EMS.   Stroke: Right MCA infarct embolic Code Stroke CT head No acute abnormality ASPECTS 10.   CTA head & neck & perfusion : 1. CT perfusion positive for 98 mL ischemia in the right MCA territory involving the temporal and parietal lobe. No core infarct on CT perfusion. 2. Acute occlusion right distal M1 segment. There is collateral flow in the right superior and inferior division of the MCA. 3. Less than 50% diameter stenosis proximal  right internal carotid artery due to calcific plaque 4. Atherosclerotic calcification left carotid bulb. Associated intraluminal filling defect which may be thrombus or soft plaque. Less than 50% diameter stenosis proximal left internal carotid artery.5. Both vertebral arteries patent to the basilar   2D Echo 1. Apex, septum and mid ventricular function down. Basal function  preserved . Left ventricular ejection fraction, by estimation, is 30 to  35%. The left ventricle has moderately decreased function. The left  ventricle demonstrates global hypokinesis. The  left ventricular internal cavity size was mildly to moderately dilated.  Left ventricular diastolic parameters are indeterminate.  2. Right ventricular systolic function is normal. The right ventricular  size is normal. There is moderately elevated pulmonary artery systolic  pressure.  3. Left atrial size was mildly dilated.  4. The mitral valve is degenerative. Mild mitral valve regurgitation. No  evidence of mitral stenosis.  5. Tricuspid valve regurgitation is mild to moderate.  6. Severe low flow AS AVA 0.72 cm2 and DVI 0.25. The aortic valve is  tricuspid. Aortic valve regurgitation is trivial. Severe aortic valve  stenosis.  7. Aortic dilatation noted. There is moderate dilatation of the ascending  aorta measuring 44 mm.  8. The inferior vena cava is normal in size with greater than 50%  respiratory variability, suggesting right atrial pressure of 3 mmHg.   LDL 44  HgbA1c 6.5  VTE prophylaxis - lovenox    Diet   Diet NPO time specified     aspirin 81 mg daily prior to admission, now on aspirin 81 mg daily and clopidogrel 75 mg daily.   Therapy recommendations:  pending  Disposition:  pending  Hypertension  Home meds:  losartan  Stable . Permissive hypertension (OK if < 220/120) but gradually normalize in 5-7 days . Long-term BP goal normotensive  Hyperlipidemia  Home meds:  crestor   LDL  44, goal < 70  Diabetes type II Controlled  Home meds:  none  HgbA1c 6.5, goal < 7.0 Other Stroke Risk  Factors  Advanced age  68  Other [facility] day # 1 Patient presented with a right M1 occlusion and large right MCA infarct likely from underlying A. fib and congestive heart failure.  She presented outside time window for TPA but within timeframe for endovascular intervention but she has a poor neurological baseline modified Rankin of 4 requiring help with in bed and she is not considered a candidate for intervention.  Her neurological prognosis is quite poor given large stroke, advanced age and poor baseline and I feel palliative care approach would be more appropriate.  No family available at the bedside.  Discussed with Dr. Reesa Chew.  Greater than 50% time during this 35-minute visit was spent in counseling and coordination of care and discussion with care team.  Antony Contras, MD  To contact Stroke Continuity provider, please refer to [email or web address] After hours, contact General Neurology

## 2020-08-03 NOTE — ED Notes (Signed)
Cards at bedside

## 2020-08-03 NOTE — ED Notes (Signed)
The pt is just moaning with every breath not answering questions

## 2020-08-03 NOTE — ED Notes (Signed)
Dr Lonni Fix for pts pain po orders given for ativan

## 2020-08-03 NOTE — ED Notes (Signed)
After pt was given breathing treatment, this RN auscultated lung sounds and heard ronchi breath sounds on expiration. No changes to lung sounds after breathing treatment.  Attempted to place pt on 6L [place], this was not tolerated well. SPO2 dropped to 80% quickly. Placed pt back on non-rebreather and SPO2 went up to 96%

## 2020-08-03 NOTE — Progress Notes (Signed)
PT Cancellation Note  Patient Details Name: COLENE MINES MRN: [id number] DOB: Apr 01, 1925   Cancelled Treatment:    Reason Eval/Treat Not Completed: Patient at procedure or test/unavailable Pt currently being transported off the floor. Will follow up as schedule allows.   Lou Miner, DPT  Acute Rehabilitation Services  Pager: [phone] Office: [phone]    Rudean Hitt 08/03/2020, 2:33 PM

## 2020-08-03 NOTE — Progress Notes (Signed)
Progress Note  Patient Name: Tamara Gregory Date of Encounter: 08/03/2020  [facility] Cardiologist: Mertie Moores, MD   Subjective   Patient lethargic and not responsive.  Husband reports she awakened overnight to say she wanted to go home. She will only grunt for him this AM.   Inpatient Medications    Scheduled Meds: .  stroke: mapping our early stages of recovery book   Does not apply Once  . albuterol  2.5 mg Nebulization Q6H  . aspirin  324 mg Oral Daily  . atorvastatin  80 mg Oral Daily  . brimonidine  1 drop Both Eyes TID  . clopidogrel  75 mg Oral Daily  . digoxin  0.25 mg Intravenous Q6H  . enoxaparin (LOVENOX) injection  30 mg Subcutaneous Q24H  . fluticasone  1 spray Each Nare QHS  . guaiFENesin  600 mg Oral BID  . hydrocortisone sod succinate (SOLU-CORTEF) inj  50 mg Intravenous Q8H  . latanoprost  1 drop Left Eye QHS  . pantoprazole (PROTONIX) IV  40 mg Intravenous Daily  . sertraline  100 mg Oral Daily   Continuous Infusions: . sodium chloride Stopped (08/03/20 0909)  . cefTRIAXone (ROCEPHIN)  IV 1 g (08/03/20 0853)   PRN Meds: acetaminophen **OR** acetaminophen (TYLENOL) oral liquid 160 mg/5 mL **OR** acetaminophen, ipratropium, morphine injection, polyethylene glycol, senna-docusate, traZODone   Vital Signs    Vitals:   08/03/20 0845 08/03/20 0854 08/03/20 0900 08/03/20 0915  BP: 126/79 126/79 115/79 137/88  Pulse: (!) 102 93 95 (!) 104  Resp: (!) 23 (!) 21 (!) 26 (!) 26  Temp:      TempSrc:      SpO2: 92% (!) 89% 92% 94%  Weight:      Height:        Intake/Output Summary (Last 24 hours) at 08/03/2020 0930 Last data filed at 08/03/2020 0909 Gross per 24 hour  Intake 739.37 ml  Output --  Net 739.37 ml   Last 3 Weights 08/12/2020 07/27/2020 07/18/2020  Weight (lbs) 150 lb 145 lb 9.6 oz 153 lb  Weight (kg) 68.04 kg 66.044 kg 69.4 kg  Some encounter information is confidential and restricted. Go to Review Flowsheets activity to see all data.       Telemetry    Atrial fibrillation.  Rate 90s-100s - Personally Reviewed  ECG    08/29/2020: Atrial fibrillation. Rate 122 bpm.  Prior inferior infarct - Personally Reviewed  Physical Exam   VS:  BP 137/88   Pulse (!) 104   Temp 99.3 F (37.4 C) (Rectal)   Resp (!) 26   Ht 5\' 2"  (1.575 m)   Wt 68 kg   SpO2 94%   BMI 27.44 kg/m  , BMI Body mass index is 27.44 kg/m. GENERAL:  Ill-appearing.  Tachypnea  HEENT: [place]/AT NECK:  No jugular venous distention, waveform within normal limits, carotid upstroke brisk and symmetric, no bruits LUNGS:  Rhonchi bilaterally on anterior exam HEART:  Irreuglarly irregular  PMI not displaced or sustained,S1 and S2 within normal limits, no S3, no S4, no clicks, no rubs, no murmurs ABD:  Flat, positive bowel sounds normal in frequency in pitch, no bruits, no rebound, no guarding, no midline pulsatile mass, no hepatomegaly, no splenomegaly EXT:  2 plus pulses throughout, 1+ LE edema L>R, no cyanosis no clubbing SKIN:  Anterior tibia eschars NEURO:  Moves all 4 extremities freely.   PSYCH: Lethargic.  Unable to assess.  Not answering questions  Labs  High Sensitivity Troponin:  No results for input(s): TROPONINIHS in the last 720 hours.    Chemistry Recent Labs  Lab 08/14/2020 0918 08/15/2020 0919 08/03/20 0248  NA 136 137 142  K 4.5 4.1 4.4  CL 91* 90* 100  CO2  --  32 28  GLUCOSE 156* 154* 153*  BUN 34* 24* 17  CREATININE 0.90 1.07* 0.81  CALCIUM  --  9.1 8.6*  PROT  --  6.7 6.3*  ALBUMIN  --  3.2* 3.0*  AST  --  37 19  ALT  --  33 31  ALKPHOS  --  83 82  BILITOT  --  1.3* 1.2  GFRNONAA  --  45* >60  GFRAA  --  52* >60  ANIONGAP  --  15 14     Hematology Recent Labs  Lab 08/14/2020 0918 08/24/2020 0919 08/03/20 0248  WBC  --  19.6* 27.2*  RBC  --  3.54* 3.57*  HGB 11.2* 9.9* 9.8*  HCT 33.0* 33.7* 35.5*  MCV  --  95.2 99.4  MCH  --  28.0 27.5  MCHC  --  29.4* 27.6*  RDW  --  15.9* 15.9*  PLT  --  273 254    BNP Recent  Labs  Lab 08/03/20 0248  BNP 2,496.7*     DDimer No results for input(s): DDIMER in the last 168 hours.   Radiology    CT CEREBRAL PERFUSION W CONTRAST  Result Date: 08/28/2020 CLINICAL DATA:  Stroke.  Left-sided weakness and slurred speech EXAM: CT ANGIOGRAPHY HEAD AND NECK CT PERFUSION BRAIN TECHNIQUE: Multidetector CT imaging of the head and neck was performed using the standard protocol during bolus administration of intravenous contrast. Multiplanar CT image reconstructions and MIPs were obtained to evaluate the vascular anatomy. Carotid stenosis measurements (when applicable) are obtained utilizing NASCET criteria, using the distal internal carotid diameter as the denominator. Multiphase CT imaging of the brain was performed following IV bolus contrast injection. Subsequent parametric perfusion maps were calculated using RAPID software. CONTRAST:  123mL OMNIPAQUE IOHEXOL 350 MG/ML SOLN COMPARISON:  CT head 08/29/2020 FINDINGS: CTA NECK FINDINGS Aortic arch: Atherosclerotic calcification throughout the aortic arch. Ascending aorta measures 42 mm in diameter. Descending thoracic aorta 26 mm in diameter. Negative for dissection. Eccentric plaque or thrombus of the distal arch measuring approximately 2 cm. This is distal to the left subclavian artery. Proximal great vessels patent without significant stenosis. Mild atherosclerotic disease at the origin the left common carotid and left subclavian artery. Right carotid system: Atherosclerotic calcification right carotid bifurcation. Less than 50% diameter stenosis proximal right internal carotid artery. Left carotid system: Atherosclerotic calcification in the carotid bulb. Adjacent to the calcification, there is a low-density filling defect in the lumen attached to the wall. This could be thrombus or soft plaque. Less than 50% diameter stenosis proximal left internal carotid artery. Kinking of the left internal carotid artery just above the plaque.  Vertebral arteries: Both vertebral arteries patent to the basilar without stenosis. Skeleton: Anterolisthesis C3-4 and C4-5. 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 Disc degeneration and spurring C6-7. 4 mm anterolisthesis C7-T1 which appears degenerative. No acute skeletal abnormality. Other neck: Negative for mass or adenopathy. Upper chest: Widespread patchy small areas of infiltrate throughout the lungs bilaterally. Small bilateral pleural effusions. Review of the MIP images confirms the above findings CTA HEAD FINDINGS Anterior circulation: Occlusion right M1 segment distally. There is flow in the anterior and superior branches of the right middle cerebral artery due to collaterals with decreased  opacification compared to the contralateral side. Both anterior cerebral arteries widely patent. Left middle cerebral artery widely patent without stenosis. Posterior circulation: Both vertebral arteries patent to the basilar. PICA patent on the right but not left. AICA, superior cerebellar, posterior cerebral arteries patent bilaterally without stenosis. Basilar patent without stenosis. Patent posterior communicating artery on the left. Venous sinuses: Normal venous enhancement. Anatomic variants: None Review of the MIP images confirms the above findings CT Brain Perfusion Findings: ASPECTS: 10 CBF (<30%) Volume: 19mL Perfusion (Tmax>6.0s) volume: 49mL Mismatch Volume: 27mL Infarction Location:Right MCA territory involving the temporal and parietal lobe. IMPRESSION: 1. CT perfusion positive for 98 mL ischemia in the right MCA territory involving the temporal and parietal lobe. No core infarct on CT perfusion. 2. Acute occlusion right distal M1 segment. There is collateral flow in the right superior and inferior division of the MCA. 3. Less than 50% diameter stenosis proximal right internal carotid artery due to calcific plaque 4. Atherosclerotic calcification left carotid bulb. Associated intraluminal filling defect which may be  thrombus or soft plaque. Less than 50% diameter stenosis proximal left internal carotid artery. 5. Both vertebral arteries patent to the basilar 6. Aortic Atherosclerosis (ICD10-I70.0). Aortic aneurysm. Ascending aorta 42 mm in diameter. 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 Recommend annual imaging followup by CTA or MRA. This recommendation follows 2010 ACCF/AHA/AATS/ACR/ASA/SCA/SCAI/SIR/STS/SVM Guidelines for the Diagnosis and Management of Patients with Thoracic Aortic Disease. Circulation. 2010; 121: V761-Y073. Aortic aneurysm NOS (ICD10-I71.9) 7. These results were called by telephone at the time of interpretation on 08/06/2020 at 9:54 am to provider [facility] , who verbally acknowledged these results. 8. Extensive patchy airspace disease bilaterally. Correlate with COVID testing. Small pleural effusions. Electronically Signed   By: Franchot Gallo M.D.   On: 08/17/2020 09:56   DG Chest Portable 1 View  Result Date: 08/27/2020 CLINICAL DATA:  Shortness of breath, leukocytosis EXAM: PORTABLE CHEST 1 VIEW COMPARISON:  03/27/2020 FINDINGS: Stable cardiomegaly. Known thoracic aortic aneurysm with extensive atherosclerosis. Patchy airspace opacity at the left lung base with small left pleural effusion. There are prominent interstitial opacities throughout both lungs. No pneumothorax. Spinal stimulator leads within the thoracic spine are again noted. IMPRESSION: 1. Patchy airspace opacity at the left lung base with small left pleural effusion, concerning for pneumonia. 2. Prominent interstitial opacities throughout both lungs may represent edema versus atypical/viral infection. Electronically Signed   By: Davina Poke D.O.   On: 08/21/2020 10:48   CT HEAD CODE STROKE WO CONTRAST  Result Date: 08/09/2020 CLINICAL DATA:  Code stroke.  Flaccid left side EXAM: CT HEAD WITHOUT CONTRAST TECHNIQUE: Contiguous axial images were obtained from the base of the  skull through the vertex without intravenous contrast. COMPARISON:  03/10/2019 FINDINGS: Brain: No evidence of acute infarction, hemorrhage, hydrocephalus, extra-axial collection or mass lesion/mass effect. Atrophy and chronic small vessel ischemia in keeping with age. Vascular: No hyperdense vessel or unexpected calcification. Skull: Normal. Negative for fracture or focal lesion. Sinuses/Orbits: Bilateral cataract resection Other: These results were communicated to [facility] at 9:23 amon 09/11/2021by text page via the [facility] messaging system. ASPECTS [facility] Stroke Program Early CT Score) - Ganglionic level infarction (caudate, lentiform nuclei, internal capsule, insula, M1-M3 cortex): 7 - Supraganglionic infarction (M4-M6 cortex): 3 Total score (0-10 with 10 being normal): 10 IMPRESSION: 1. Aged brain without acute finding. 2. ASPECTS is 10.  No visible thalamic infarct. Electronically Signed   By: Monte Fantasia M.D.   On: 08/02/2020 09:25  CT ANGIO HEAD CODE STROKE  Result Date: 08/07/2020 CLINICAL DATA:  Stroke.  Left-sided weakness and slurred speech EXAM: CT ANGIOGRAPHY HEAD AND NECK CT PERFUSION BRAIN TECHNIQUE: Multidetector CT imaging of the head and neck was performed using the standard protocol during bolus administration of intravenous contrast. Multiplanar CT image reconstructions and MIPs were obtained to evaluate the vascular anatomy. Carotid stenosis measurements (when applicable) are obtained utilizing NASCET criteria, using the distal internal carotid diameter as the denominator. Multiphase CT imaging of the brain was performed following IV bolus contrast injection. Subsequent parametric perfusion maps were calculated using RAPID software. CONTRAST:  118mL OMNIPAQUE IOHEXOL 350 MG/ML SOLN COMPARISON:  CT head 08/22/2020 FINDINGS: CTA NECK FINDINGS Aortic arch: Atherosclerotic calcification throughout the aortic arch. Ascending aorta measures 42 mm in diameter. Descending thoracic aorta 26 mm  in diameter. Negative for dissection. Eccentric plaque or thrombus of the distal arch measuring approximately 2 cm. This is distal to the left subclavian artery. Proximal great vessels patent without significant stenosis. Mild atherosclerotic disease at the origin the left common carotid and left subclavian artery. Right carotid system: Atherosclerotic calcification right carotid bifurcation. Less than 50% diameter stenosis proximal right internal carotid artery. Left carotid system: Atherosclerotic calcification in the carotid bulb. Adjacent to the calcification, there is a low-density filling defect in the lumen attached to the wall. This could be thrombus or soft plaque. Less than 50% diameter stenosis proximal left internal carotid artery. Kinking of the left internal carotid artery just above the plaque. Vertebral arteries: Both vertebral arteries patent to the basilar without stenosis. Skeleton: Anterolisthesis C3-4 and C4-5. 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 Disc degeneration and spurring C6-7. 4 mm anterolisthesis C7-T1 which appears degenerative. No acute skeletal abnormality. Other neck: Negative for mass or adenopathy. Upper chest: Widespread patchy small areas of infiltrate throughout the lungs bilaterally. Small bilateral pleural effusions. Review of the MIP images confirms the above findings CTA HEAD FINDINGS Anterior circulation: Occlusion right M1 segment distally. There is flow in the anterior and superior branches of the right middle cerebral artery due to collaterals with decreased opacification compared to the contralateral side. Both anterior cerebral arteries widely patent. Left middle cerebral artery widely patent without stenosis. Posterior circulation: Both vertebral arteries patent to the basilar. PICA patent on the right but not left. AICA, superior cerebellar, posterior cerebral arteries patent bilaterally without stenosis. Basilar patent without stenosis. Patent posterior communicating artery on  the left. Venous sinuses: Normal venous enhancement. Anatomic variants: None Review of the MIP images confirms the above findings CT Brain Perfusion Findings: ASPECTS: 10 CBF (<30%) Volume: 50mL Perfusion (Tmax>6.0s) volume: 56mL Mismatch Volume: 22mL Infarction Location:Right MCA territory involving the temporal and parietal lobe. IMPRESSION: 1. CT perfusion positive for 98 mL ischemia in the right MCA territory involving the temporal and parietal lobe. No core infarct on CT perfusion. 2. Acute occlusion right distal M1 segment. There is collateral flow in the right superior and inferior division of the MCA. 3. Less than 50% diameter stenosis proximal right internal carotid artery due to calcific plaque 4. Atherosclerotic calcification left carotid bulb. Associated intraluminal filling defect which may be thrombus or soft plaque. Less than 50% diameter stenosis proximal left internal carotid artery. 5. Both vertebral arteries patent to the basilar 6. Aortic Atherosclerosis (ICD10-I70.0). Aortic aneurysm. Ascending aorta 42 mm in diameter. 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 Recommend annual imaging followup by CTA or MRA. This recommendation follows 2010 ACCF/AHA/AATS/ACR/ASA/SCA/SCAI/SIR/STS/SVM Guidelines for the Diagnosis and Management of  Patients with Thoracic Aortic Disease. Circulation. 2010; 121: X448-J856. Aortic aneurysm NOS (ICD10-I71.9) 7. These results were called by telephone at the time of interpretation on 08/19/2020 at 9:54 am to provider [facility] , who verbally acknowledged these results. 8. Extensive patchy airspace disease bilaterally. Correlate with COVID testing. Small pleural effusions. Electronically Signed   By: Franchot Gallo M.D.   On: 08/15/2020 09:56   CT ANGIO NECK CODE STROKE  Result Date: 08/12/2020 CLINICAL DATA:  Stroke.  Left-sided weakness and slurred speech EXAM: CT ANGIOGRAPHY HEAD AND NECK CT PERFUSION BRAIN  TECHNIQUE: Multidetector CT imaging of the head and neck was performed using the standard protocol during bolus administration of intravenous contrast. Multiplanar CT image reconstructions and MIPs were obtained to evaluate the vascular anatomy. Carotid stenosis measurements (when applicable) are obtained utilizing NASCET criteria, using the distal internal carotid diameter as the denominator. Multiphase CT imaging of the brain was performed following IV bolus contrast injection. Subsequent parametric perfusion maps were calculated using RAPID software. CONTRAST:  114mL OMNIPAQUE IOHEXOL 350 MG/ML SOLN COMPARISON:  CT head 08/20/2020 FINDINGS: CTA NECK FINDINGS Aortic arch: Atherosclerotic calcification throughout the aortic arch. Ascending aorta measures 42 mm in diameter. Descending thoracic aorta 26 mm in diameter. Negative for dissection. Eccentric plaque or thrombus of the distal arch measuring approximately 2 cm. This is distal to the left subclavian artery. Proximal great vessels patent without significant stenosis. Mild atherosclerotic disease at the origin the left common carotid and left subclavian artery. Right carotid system: Atherosclerotic calcification right carotid bifurcation. Less than 50% diameter stenosis proximal right internal carotid artery. Left carotid system: Atherosclerotic calcification in the carotid bulb. Adjacent to the calcification, there is a low-density filling defect in the lumen attached to the wall. This could be thrombus or soft plaque. Less than 50% diameter stenosis proximal left internal carotid artery. Kinking of the left internal carotid artery just above the plaque. Vertebral arteries: Both vertebral arteries patent to the basilar without stenosis. Skeleton: Anterolisthesis C3-4 and C4-5. 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 Disc degeneration and spurring C6-7. 4 mm anterolisthesis C7-T1 which appears degenerative. No acute skeletal abnormality. Other neck: Negative for mass or  adenopathy. Upper chest: Widespread patchy small areas of infiltrate throughout the lungs bilaterally. Small bilateral pleural effusions. Review of the MIP images confirms the above findings CTA HEAD FINDINGS Anterior circulation: Occlusion right M1 segment distally. There is flow in the anterior and superior branches of the right middle cerebral artery due to collaterals with decreased opacification compared to the contralateral side. Both anterior cerebral arteries widely patent. Left middle cerebral artery widely patent without stenosis. Posterior circulation: Both vertebral arteries patent to the basilar. PICA patent on the right but not left. AICA, superior cerebellar, posterior cerebral arteries patent bilaterally without stenosis. Basilar patent without stenosis. Patent posterior communicating artery on the left. Venous sinuses: Normal venous enhancement. Anatomic variants: None Review of the MIP images confirms the above findings CT Brain Perfusion Findings: ASPECTS: 10 CBF (<30%) Volume: 10mL Perfusion (Tmax>6.0s) volume: 19mL Mismatch Volume: 65mL Infarction Location:Right MCA territory involving the temporal and parietal lobe. IMPRESSION: 1. CT perfusion positive for 98 mL ischemia in the right MCA territory involving the temporal and parietal lobe. No core infarct on CT perfusion. 2. Acute occlusion right distal M1 segment. There is collateral flow in the right superior and inferior division of the MCA. 3. Less than 50% diameter stenosis proximal right internal carotid artery due to calcific plaque 4. Atherosclerotic calcification left carotid bulb. Associated intraluminal filling defect  which may be thrombus or soft plaque. Less than 50% diameter stenosis proximal left internal carotid artery. 5. Both vertebral arteries patent to the basilar 6. Aortic Atherosclerosis (ICD10-I70.0). Aortic aneurysm. Ascending aorta 42 mm in diameter. Atherosclerotic plaque or adherent mural thrombus distal to the left  subclavian artery. Recommend annual imaging followup by CTA or MRA. This recommendation follows 2010 ACCF/AHA/AATS/ACR/ASA/SCA/SCAI/SIR/STS/SVM Guidelines for the Diagnosis and Management of Patients with Thoracic Aortic Disease. Circulation. 2010; 121: H299-M426. Aortic aneurysm NOS (ICD10-I71.9) 7. These results were called by telephone at the time of interpretation on 08/28/2020 at 9:54 am to provider [facility] , who verbally acknowledged these results. 8. Extensive patchy airspace disease bilaterally. Correlate with COVID testing. Small pleural effusions. Electronically Signed   By: Franchot Gallo M.D.   On: 08/14/2020 09:56    Cardiac Studies   Echo 05/2019: 1. Apical left ventricular aneurysm.  2. Large apical myocardial infarction.  3. Severe hypokinesis of the left ventricular, mid-apical anteroseptal  wall and anterior wall.  4. The left ventricle has moderately reduced systolic function, with an  ejection fraction of 35-40%. The cavity size was normal. Left ventricular  diastolic Doppler parameters are consistent with impaired relaxation.  5. The right ventricle has normal systolic function. The cavity was  normal. There is no increase in right ventricular wall thickness. Right  ventricular systolic pressure is mildly elevated with an estimated  pressure of 35.5 mmHg.  6. Left atrial size was mildly dilated.  7. The aortic valve is tricuspid. Severely thickening of the aortic  valve. Severe calcification of the aortic valve. Aortic valve  regurgitation is trivial by color flow Doppler. Severe stenosis of the  aortic valve.  8. Aortic stenosis gradients are lower than expected due to reduced LV  systolic function.  9. There is mild dilatation of the ascending aorta measuring 44 mm.  10. No intracardiac thrombi or masses were visualized.  11. When compared to the prior study: compared to 2015 (report from  [facility]), there is a new left ventricular apical  aneurysm, LVEF  is substantially lower and there is now severe AS.   Patient Profile     Tamara Gregory is a 21F with medically managed CAD, chronic systolic and diastolic heart failure, severe AS, hypertension, hyperlipidemia, myasthenia gravis and newly diagnosed lung cancer admitted with stroke and new onset atrial fibrillation.  Assessment & Plan    # CVA:  New this admission.  Neurology is following.  She has new afib and likely embolic in etiology.  She also has an apical LV aneurysm.  She was not started on heparin due to concern for hemorrhagic conversion.  She was started on aspirin and clopidogrel.  This is less likely to be effective for cardioembolic source and she is NPO due to mental status. Would start heparin as soon as deemed safe by neurology.  Recommend switching to Eliquis when stable per neurology.  No indication   # PAF:  New onset.  Rate improving on digoxin. No beta blocker 2/2 Myasthenia.  Switch digoxin to 0.25 mg daily.  Check level in two days.  Start heparin when able as above.  TSH is normal this admission.   # Acute on chronic systolic and diastolic heart failure:  LVEF 35-40% with focal wall motion abnormalities on echo 05/2019.  She has more LE edema lately that has responded to increased torsemide. Suspect her acute exacerbation is related to afib with RVR.  She remains volume overloaded.  BP is soft  but improving.  Hypotension likely 2/2 sepsis (UTI, PNA) and cardiogenic).  Will start lasix 40mg  IV daily.  # CAD:  Noted on CT.  No ischemic work up given lack of symptoms and failure to thrive.   Medically managed with aspirin.  Clopidogrel added for now, but suspect this will be switched to anticoagulation prior to discharge given her new afib.  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  Continue to watch.  Increased from 4.2cm in one year.  Not a candidate for beta blocker 2/2 Myasthenia gravis.   Overall prognosis is poor given her failure to thrive and  multiple co-morbidities with new stroke and atrial fibrillation.  We will continue to follow.      For questions or updates, please contact [place] Please consult [email or web address] for contact info under        Signed, Skeet Latch, MD  08/03/2020, 9:30 AM

## 2020-08-03 NOTE — Progress Notes (Signed)
Patient evaluated at bedside after page for moaning. Patient has upper airway secretions that rattle and can be heard across the room. Patient also has tenderness to palpation of both lower adominal quadrants. Morphine ordered for pain. Repeat cbc to eval trend of WBC. Mucinex for mucous. Advised nurse that there are orders that are 12 hours old, so we really need to follow up with lab re: Mag, TSH, and send urine off.   Patient is moaning at my evaluation, and repeating the word no. Possible sundowning? Possibly 2/2 pain. Continue to monitor.

## 2020-08-03 NOTE — ED Notes (Addendum)
Attempted report to [address] [place]. Will reattempt report.

## 2020-08-03 NOTE — ED Notes (Signed)
Was given report that the pt was unresponsive  I heard moaning coming from the room  When I walked in the room I asked her if she was ok and she responded  That she wanted to go home

## 2020-08-03 NOTE — ED Notes (Signed)
The pt does not do anything on command  She does not answer questions asked  She appears to be in a great deal of pain  The ms given does not appear to have done anything for her pain

## 2020-08-03 NOTE — ED Notes (Signed)
Rt at bedside no diference in the pts moaning after the ms was given iv

## 2020-08-03 NOTE — ED Notes (Signed)
Attempted blood draw, echo at bedside. Will reattempt blood draw when echo is done.

## 2020-08-03 NOTE — ED Notes (Signed)
Pt is not responding to anything just moans unable to follw commands moves her rt leg to the side of the bed

## 2020-08-03 NOTE — ED Notes (Signed)
Pt moaning loudly again  Moving her rt leg and arm across the  sheet

## 2020-08-03 NOTE — ED Notes (Signed)
She appers to be in a great deal os pain

## 2020-08-03 NOTE — Progress Notes (Signed)
  Echocardiogram 2D Echocardiogram has been performed.  Tamara Gregory G Ardice Boyan 08/03/2020, 10:45 AM

## 2020-08-03 NOTE — ED Notes (Signed)
Loud moans continue pt does not follow commands moves rt leg and rt arm across the sheet not to any commands  She opened both eyes and looked directly into my eyes not answering  Questions.

## 2020-08-03 NOTE — Progress Notes (Signed)
Pt has arrived to the unit via hospital bed. All IV are intact, oxygen and tele has been connected. All belongings have been sent with the patient. Husband is at bedside. Telephone and Call button are within reach of patient.

## 2020-08-03 NOTE — ED Notes (Signed)
Admitting doctor att the bedside  New orders given

## 2020-08-04 ENCOUNTER — Ambulatory Visit (HOSPITAL_COMMUNITY): Admission: RE | Admit: 2020-08-04 | Payer: Medicare Other | Source: Ambulatory Visit

## 2020-08-04 DIAGNOSIS — Z515 Encounter for palliative care: Secondary | ICD-10-CM

## 2020-08-04 DIAGNOSIS — I712 Thoracic aortic aneurysm, without rupture: Secondary | ICD-10-CM

## 2020-08-04 DIAGNOSIS — R6521 Severe sepsis with septic shock: Secondary | ICD-10-CM

## 2020-08-04 DIAGNOSIS — Z7189 Other specified counseling: Secondary | ICD-10-CM

## 2020-08-04 DIAGNOSIS — A419 Sepsis, unspecified organism: Principal | ICD-10-CM

## 2020-08-04 DIAGNOSIS — F3289 Other specified depressive episodes: Secondary | ICD-10-CM

## 2020-08-04 DIAGNOSIS — I2510 Atherosclerotic heart disease of native coronary artery without angina pectoris: Secondary | ICD-10-CM

## 2020-08-04 DIAGNOSIS — J9601 Acute respiratory failure with hypoxia: Secondary | ICD-10-CM

## 2020-08-04 LAB — GLUCOSE, CAPILLARY
Glucose-Capillary: 163 mg/dL — ABNORMAL HIGH (ref 70–99)
Glucose-Capillary: 154 mg/dL — ABNORMAL HIGH (ref 70–99)
Glucose-Capillary: 153 mg/dL — ABNORMAL HIGH (ref 70–99)

## 2020-08-04 LAB — PATHOLOGIST SMEAR REVIEW

## 2020-08-04 MED ORDER — LORAZEPAM 2 MG/ML IJ SOLN: 0.5000 mg | INTRAMUSCULAR | Status: DC | PRN

## 2020-08-04 MED ORDER — MORPHINE SULFATE (PF) 2 MG/ML IV SOLN
1.0000 mg | INTRAVENOUS | Status: DC | PRN
  Administered 2020-08-04: 2 mg via INTRAVENOUS
  Filled 2020-08-04: qty 1

## 2020-08-04 MED ORDER — ONDANSETRON 4 MG PO TBDP: 4.0000 mg | ORAL_TABLET | Freq: Four times a day (QID) | ORAL | Status: DC | PRN

## 2020-08-04 MED ORDER — IPRATROPIUM BROMIDE 0.02 % IN SOLN: 0.5000 mg | Freq: Four times a day (QID) | RESPIRATORY_TRACT | Status: DC | PRN

## 2020-08-04 MED ORDER — DIGOXIN 0.25 MG/ML IJ SOLN
0.2500 mg | Freq: Every day | INTRAMUSCULAR | Status: DC
  Administered 2020-08-04: 0.25 mg via INTRAVENOUS
  Filled 2020-08-04 (×2): qty 1

## 2020-08-04 MED ORDER — POLYVINYL ALCOHOL 1.4 % OP SOLN
1.0000 [drp] | Freq: Four times a day (QID) | OPHTHALMIC | Status: DC | PRN
  Filled 2020-08-04: qty 15

## 2020-08-04 MED ORDER — BIOTENE DRY MOUTH MT LIQD: 15.0000 mL | OROMUCOSAL | Status: DC | PRN

## 2020-08-04 MED ORDER — ONDANSETRON HCL 4 MG/2ML IJ SOLN: 4.0000 mg | Freq: Four times a day (QID) | INTRAMUSCULAR | Status: DC | PRN

## 2020-08-04 MED ORDER — BISACODYL 10 MG RE SUPP: 10.0000 mg | Freq: Every day | RECTAL | Status: DC | PRN

## 2020-08-04 MED ORDER — MORPHINE 100MG IN NS 100ML (1MG/ML) PREMIX INFUSION
1.0000 mg/h | INTRAVENOUS | Status: DC
  Administered 2020-08-04: 1 mg/h via INTRAVENOUS
  Filled 2020-08-04: qty 100

## 2020-08-04 MED ORDER — LEVALBUTEROL HCL 1.25 MG/0.5ML IN NEBU
1.2500 mg | INHALATION_SOLUTION | Freq: Four times a day (QID) | RESPIRATORY_TRACT | Status: DC | PRN
  Filled 2020-08-04: qty 0.5

## 2020-08-04 NOTE — Progress Notes (Signed)
Progress Note  Patient Name: JANIAYA RYSER Date of Encounter: 08/04/2020  [facility] Cardiologist: Mertie Moores, MD   Subjective   Patient lethargic and not responsive.  She was started on morphine for air hunger and moaning.  Inpatient Medications    Scheduled Meds: .  stroke: mapping our early stages of recovery book   Does not apply Once  . aspirin  324 mg Oral Daily  . atorvastatin  80 mg Oral Daily  . brimonidine  1 drop Both Eyes TID  . clopidogrel  75 mg Oral Daily  . enoxaparin (LOVENOX) injection  30 mg Subcutaneous Q24H  . fluticasone  1 spray Each Nare QHS  . furosemide  40 mg Intravenous BID  . guaiFENesin  600 mg Oral BID  . hydrocortisone sod succinate (SOLU-CORTEF) inj  50 mg Intravenous Q8H  . ipratropium  0.5 mg Nebulization BID  . latanoprost  1 drop Left Eye QHS  . levalbuterol  1.25 mg Nebulization BID  . pantoprazole (PROTONIX) IV  40 mg Intravenous Daily  . scopolamine  1 patch Transdermal Q72H  . sertraline  100 mg Oral Daily   Continuous Infusions: . cefTRIAXone (ROCEPHIN)  IV Stopped (08/03/20 1008)  . morphine     PRN Meds: acetaminophen **OR** acetaminophen (TYLENOL) oral liquid 160 mg/5 mL **OR** acetaminophen, antiseptic oral rinse, LORazepam, morphine injection, ondansetron **OR** ondansetron (ZOFRAN) IV, polyethylene glycol, polyvinyl alcohol, senna-docusate, traZODone   Vital Signs    Vitals:   08/03/20 1935 08/03/20 2308 08/04/20 0307 08/04/20 0752  BP: (!) 187/77 (!) 140/97 (!) 147/92 (!) 103/91  Pulse: 83 86 (!) 102 (!) 108  Resp: 20 19 20 20   Temp: 98.9 F (37.2 C) 98.1 F (36.7 C) 99 F (37.2 C) (!) 100.9 F (38.3 C)  TempSrc: Oral Oral Oral Axillary  SpO2: 95% 95% 93% 92%  Weight:      Height:        Intake/Output Summary (Last 24 hours) at 08/04/2020 0848 Last data filed at 08/03/2020 2308 Gross per 24 hour  Intake 763.31 ml  Output 1700 ml  Net -936.69 ml   Last 3 Weights 08/29/2020 07/27/2020 07/18/2020  Weight  (lbs) 150 lb 145 lb 9.6 oz 153 lb  Weight (kg) 68.04 kg 66.044 kg 69.4 kg  Some encounter information is confidential and restricted. Go to Review Flowsheets activity to see all data.      Telemetry    Atrial fibrillation.  Rate 90s-100s - Personally Reviewed  ECG    08/29/2020: Atrial fibrillation. Rate 122 bpm.  Prior inferior infarct - Personally Reviewed  Physical Exam   VS:  BP (!) 103/91 (BP Location: Left Leg)   Pulse (!) 108   Temp (!) 100.9 F (38.3 C) (Axillary)   Resp 20   Ht 5\' 2"  (1.575 m)   Wt 68 kg   SpO2 92%   BMI 27.44 kg/m  , BMI Body mass index is 27.44 kg/m. GENERAL:  Ill-appearing.  Moaning and unresponsive.  HEENT: [place]/AT NECK:  No jugular venous distention, waveform within normal limits, carotid upstroke brisk and symmetric, no bruits LUNGS:  Rhonchi bilaterally on anterior exam HEART:  Irreuglarly irregular  PMI not displaced or sustained,S1 and S2 within normal limits, no S3, no S4, no clicks, no rubs, no murmurs ABD:  Flat, positive bowel sounds normal in frequency in pitch, no bruits, no rebound, no guarding, no midline pulsatile mass, no hepatomegaly, no splenomegaly EXT:  2 plus pulses throughout, 1+ LE edema L>R,  no cyanosis no clubbing SKIN:  Anterior tibia eschars NEURO:  Moves all 4 extremities freely.   PSYCH: Lethargic.  Unable to assess.  Not answering questions  Labs    High Sensitivity Troponin:  No results for input(s): TROPONINIHS in the last 720 hours.    Chemistry Recent Labs  Lab 08/29/2020 0918 08/18/2020 0918 08/08/2020 0919 08/03/20 0248 08/03/20 1116  NA 136   < > 137 142 142  K 4.5   < > 4.1 4.4 3.7  CL 91*  --  90* 100  --   CO2  --   --  32 28  --   GLUCOSE 156*  --  154* 153*  --   BUN 34*  --  24* 17  --   CREATININE 0.90  --  1.07* 0.81  --   CALCIUM  --   --  9.1 8.6*  --   PROT  --   --  6.7 6.3*  --   ALBUMIN  --   --  3.2* 3.0*  --   AST  --   --  37 19  --   ALT  --   --  33 31  --   ALKPHOS  --   --  83 82   --   BILITOT  --   --  1.3* 1.2  --   GFRNONAA  --   --  45* >60  --   GFRAA  --   --  52* >60  --   ANIONGAP  --   --  15 14  --    < > = values in this interval not displayed.     Hematology Recent Labs  Lab 08/09/2020 0919 08/03/20 0248 08/03/20 1116  WBC 19.6* 27.2*  --   RBC 3.54* 3.57*  --   HGB 9.9* 9.8* 11.2*  HCT 33.7* 35.5* 33.0*  MCV 95.2 99.4  --   MCH 28.0 27.5  --   MCHC 29.4* 27.6*  --   RDW 15.9* 15.9*  --   PLT 273 254  --     BNP Recent Labs  Lab 08/03/20 0248  BNP 2,496.7*     DDimer No results for input(s): DDIMER in the last 168 hours.   Radiology    CT CEREBRAL PERFUSION W CONTRAST  Result Date: 08/15/2020 CLINICAL DATA:  Stroke.  Left-sided weakness and slurred speech EXAM: CT ANGIOGRAPHY HEAD AND NECK CT PERFUSION BRAIN TECHNIQUE: Multidetector CT imaging of the head and neck was performed using the standard protocol during bolus administration of intravenous contrast. Multiplanar CT image reconstructions and MIPs were obtained to evaluate the vascular anatomy. Carotid stenosis measurements (when applicable) are obtained utilizing NASCET criteria, using the distal internal carotid diameter as the denominator. Multiphase CT imaging of the brain was performed following IV bolus contrast injection. Subsequent parametric perfusion maps were calculated using RAPID software. CONTRAST:  136mL OMNIPAQUE IOHEXOL 350 MG/ML SOLN COMPARISON:  CT head 08/29/2020 FINDINGS: CTA NECK FINDINGS Aortic arch: Atherosclerotic calcification throughout the aortic arch. Ascending aorta measures 42 mm in diameter. Descending thoracic aorta 26 mm in diameter. Negative for dissection. Eccentric plaque or thrombus of the distal arch measuring approximately 2 cm. This is distal to the left subclavian artery. Proximal great vessels patent without significant stenosis. Mild atherosclerotic disease at the origin the left common carotid and left subclavian artery. Right carotid system:  Atherosclerotic calcification right carotid bifurcation. Less than 50% diameter stenosis proximal right internal carotid artery. Left carotid system:  Atherosclerotic calcification in the carotid bulb. Adjacent to the calcification, there is a low-density filling defect in the lumen attached to the wall. This could be thrombus or soft plaque. Less than 50% diameter stenosis proximal left internal carotid artery. Kinking of the left internal carotid artery just above the plaque. Vertebral arteries: Both vertebral arteries patent to the basilar without stenosis. Skeleton: Anterolisthesis C3-4 and C4-5. 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 Disc degeneration and spurring C6-7. 4 mm anterolisthesis C7-T1 which appears degenerative. No acute skeletal abnormality. Other neck: Negative for mass or adenopathy. Upper chest: Widespread patchy small areas of infiltrate throughout the lungs bilaterally. Small bilateral pleural effusions. Review of the MIP images confirms the above findings CTA HEAD FINDINGS Anterior circulation: Occlusion right M1 segment distally. There is flow in the anterior and superior branches of the right middle cerebral artery due to collaterals with decreased opacification compared to the contralateral side. Both anterior cerebral arteries widely patent. Left middle cerebral artery widely patent without stenosis. Posterior circulation: Both vertebral arteries patent to the basilar. PICA patent on the right but not left. AICA, superior cerebellar, posterior cerebral arteries patent bilaterally without stenosis. Basilar patent without stenosis. Patent posterior communicating artery on the left. Venous sinuses: Normal venous enhancement. Anatomic variants: None Review of the MIP images confirms the above findings CT Brain Perfusion Findings: ASPECTS: 10 CBF (<30%) Volume: 61mL Perfusion (Tmax>6.0s) volume: 23mL Mismatch Volume: 73mL Infarction Location:Right MCA territory involving the temporal and parietal lobe.  IMPRESSION: 1. CT perfusion positive for 98 mL ischemia in the right MCA territory involving the temporal and parietal lobe. No core infarct on CT perfusion. 2. Acute occlusion right distal M1 segment. There is collateral flow in the right superior and inferior division of the MCA. 3. Less than 50% diameter stenosis proximal right internal carotid artery due to calcific plaque 4. Atherosclerotic calcification left carotid bulb. Associated intraluminal filling defect which may be thrombus or soft plaque. Less than 50% diameter stenosis proximal left internal carotid artery. 5. Both vertebral arteries patent to the basilar 6. Aortic Atherosclerosis (ICD10-I70.0). Aortic aneurysm. Ascending aorta 42 mm in diameter. 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 Recommend annual imaging followup by CTA or MRA. This recommendation follows 2010 ACCF/AHA/AATS/ACR/ASA/SCA/SCAI/SIR/STS/SVM Guidelines for the Diagnosis and Management of Patients with Thoracic Aortic Disease. Circulation. 2010; 121: S970-Y637. Aortic aneurysm NOS (ICD10-I71.9) 7. These results were called by telephone at the time of interpretation on 08/25/2020 at 9:54 am to provider [facility] , who verbally acknowledged these results. 8. Extensive patchy airspace disease bilaterally. Correlate with COVID testing. Small pleural effusions. Electronically Signed   By: Franchot Gallo M.D.   On: 08/04/2020 09:56   DG CHEST PORT 1 VIEW  Result Date: 08/03/2020 CLINICAL DATA:  Dyspnea EXAM: PORTABLE CHEST 1 VIEW COMPARISON:  08/29/2020 FINDINGS: Single frontal view of the chest demonstrates an enlarged cardiac silhouette. Stable ectasia of the thoracic aorta with marked atherosclerosis. Persistent central vascular congestion with bilateral left greater than right airspace disease. The densest consolidation is within the left lower lobe. Suspected left effusion unchanged. No pneumothorax. IMPRESSION: 1. Constellation  of findings most suggestive of congestive heart failure. Superimposed infection would be difficult to exclude. Slight progression since prior study. Electronically Signed   By: Randa Ngo M.D.   On: 08/03/2020 21:48   DG Chest Portable 1 View  Result Date: 08/19/2020 CLINICAL DATA:  Shortness of breath, leukocytosis EXAM: PORTABLE CHEST 1 VIEW COMPARISON:  03/27/2020 FINDINGS: Stable cardiomegaly. Known thoracic aortic  aneurysm with extensive atherosclerosis. Patchy airspace opacity at the left lung base with small left pleural effusion. There are prominent interstitial opacities throughout both lungs. No pneumothorax. Spinal stimulator leads within the thoracic spine are again noted. IMPRESSION: 1. Patchy airspace opacity at the left lung base with small left pleural effusion, concerning for pneumonia. 2. Prominent interstitial opacities throughout both lungs may represent edema versus atypical/viral infection. Electronically Signed   By: Davina Poke D.O.   On: 08/03/2020 10:48   ECHOCARDIOGRAM COMPLETE  Result Date: 08/03/2020    ECHOCARDIOGRAM REPORT   Patient Name:   KEVIONNA HEFFLER Date of Exam: 08/03/2020 Medical Rec #:  [id number]      Height:       62.0 in Accession #:    [id number]     Weight:       150.0 lb Date of Birth:  [birth year]      BSA:          1.692 m Patient Age:    85 years       BP:           123/86 mmHg Patient Gender: F              HR:           98 bpm. Exam Location:  Inpatient Procedure: 2D Echo, Cardiac Doppler, Color Doppler and Intracardiac            Opacification Agent Indications:    TIA 435.09 / G45.96  History:        Patient has prior history of Echocardiogram examinations, most                 recent 05/11/2019. Risk Factors:Hypertension and Dyslipidemia.                 GERD. Cancer.  Sonographer:    Jonelle Sidle Dance Referring Phys: [id number] [place]  1. Apex, septum and mid ventricular function down. Basal function preserved . Left ventricular ejection  fraction, by estimation, is 30 to 35%. The left ventricle has moderately decreased function. The left ventricle demonstrates global hypokinesis. The left ventricular internal cavity size was mildly to moderately dilated. Left ventricular diastolic parameters are indeterminate.  2. Right ventricular systolic function is normal. The right ventricular size is normal. There is moderately elevated pulmonary artery systolic pressure.  3. Left atrial size was mildly dilated.  4. The mitral valve is degenerative. Mild mitral valve regurgitation. No evidence of mitral stenosis.  5. Tricuspid valve regurgitation is mild to moderate.  6. Severe low flow AS AVA 0.72 cm2 and DVI 0.25. The aortic valve is tricuspid. Aortic valve regurgitation is trivial. Severe aortic valve stenosis.  7. Aortic dilatation noted. There is moderate dilatation of the ascending aorta measuring 44 mm.  8. The inferior vena cava is normal in size with greater than 50% respiratory variability, suggesting right atrial pressure of 3 mmHg. FINDINGS  Left Ventricle: Apex, septum and mid ventricular function down. Basal function preserved. Left ventricular ejection fraction, by estimation, is 30 to 35%. The left ventricle has moderately decreased function. The left ventricle demonstrates global hypokinesis. Definity contrast agent was given IV to delineate the left ventricular endocardial borders. The left ventricular internal cavity size was mildly to moderately dilated. There is no left ventricular hypertrophy. Left ventricular diastolic parameters are indeterminate. Right Ventricle: The right ventricular size is normal. No increase in right ventricular wall thickness. Right ventricular systolic function is normal. There is moderately elevated  pulmonary artery systolic pressure. The tricuspid regurgitant velocity is 2.96 m/s, and with an assumed right atrial pressure of 15 mmHg, the estimated right ventricular systolic pressure is 57.8 mmHg. Left Atrium:  Left atrial size was mildly dilated. Right Atrium: Right atrial size was normal in size. Pericardium: Trivial pericardial effusion is present. The pericardial effusion is posterior and lateral to the left ventricle. Mitral Valve: The mitral valve is degenerative in appearance. There is moderate thickening of the mitral valve leaflet(s). There is moderate calcification of the mitral valve leaflet(s). Normal mobility of the mitral valve leaflets. Moderate mitral annular calcification. Mild mitral valve regurgitation. No evidence of mitral valve stenosis. Tricuspid Valve: The tricuspid valve is normal in structure. Tricuspid valve regurgitation is mild to moderate. No evidence of tricuspid stenosis. Aortic Valve: Severe low flow AS AVA 0.72 cm2 and DVI 0.25. The aortic valve is tricuspid. . There is severe thickening and severe calcifcation of the aortic valve. Aortic valve regurgitation is trivial. Severe aortic stenosis is present. Severe aortic valve annular calcification. There is severe thickening of the aortic valve. There is severe calcifcation of the aortic valve. Aortic valve mean gradient measures 25.3 mmHg. Aortic valve peak gradient measures 40.0 mmHg. Aortic valve area, by VTI measures 0.70 cm. Pulmonic Valve: The pulmonic valve was normal in structure. Pulmonic valve regurgitation is not visualized. No evidence of pulmonic stenosis. Aorta: The aortic root is normal in size and structure and aortic dilatation noted. There is moderate dilatation of the ascending aorta measuring 44 mm. Venous: The inferior vena cava is normal in size with greater than 50% respiratory variability, suggesting right atrial pressure of 3 mmHg. IAS/Shunts: No atrial level shunt detected by color flow Doppler.  LEFT VENTRICLE PLAX 2D LVIDd:         4.40 cm LVIDs:         3.30 cm LV PW:         1.10 cm LV IVS:        1.00 cm LVOT diam:     1.90 cm LV SV:         44 LV SV Index:   26 LVOT Area:     2.84 cm  RIGHT VENTRICLE           IVC RV Basal diam:  2.30 cm  IVC diam: 2.20 cm TAPSE (M-mode): 1.2 cm LEFT ATRIUM             Index       RIGHT ATRIUM           Index LA diam:        4.20 cm 2.48 cm/m  RA Area:     18.50 cm LA Vol (A2C):   93.7 ml 55.39 ml/m RA Volume:   48.00 ml  28.37 ml/m LA Vol (A4C):   74.4 ml 43.98 ml/m LA Biplane Vol: 89.4 ml 52.85 ml/m  AORTIC VALVE AV Area (Vmax):    0.73 cm AV Area (Vmean):   0.72 cm AV Area (VTI):     0.70 cm AV Vmax:           316.33 cm/s AV Vmean:          240.667 cm/s AV VTI:            0.631 m AV Peak Grad:      40.0 mmHg AV Mean Grad:      25.3 mmHg LVOT Vmax:         80.90 cm/s LVOT Vmean:  61.150 cm/s LVOT VTI:          0.156 m LVOT/AV VTI ratio: 0.25  AORTA Ao Root diam: 3.10 cm Ao Asc diam:  4.40 cm MITRAL VALVE                TRICUSPID VALVE MV Area (PHT): 3.91 cm     TR Peak grad:   35.0 mmHg MV Decel Time: 194 msec     TR Vmax:        296.00 cm/s MV E velocity: 152.00 cm/s MV A velocity: 61.60 cm/s   SHUNTS MV E/A ratio:  2.47         Systemic VTI:  0.16 m                             Systemic Diam: 1.90 cm Jenkins Rouge MD Electronically signed by Jenkins Rouge MD Signature Date/Time: 08/03/2020/12:48:38 PM    Final    CT HEAD CODE STROKE WO CONTRAST  Result Date: 08/03/2020 CLINICAL DATA:  Code stroke.  Flaccid left side EXAM: CT HEAD WITHOUT CONTRAST TECHNIQUE: Contiguous axial images were obtained from the base of the skull through the vertex without intravenous contrast. COMPARISON:  03/10/2019 FINDINGS: Brain: No evidence of acute infarction, hemorrhage, hydrocephalus, extra-axial collection or mass lesion/mass effect. Atrophy and chronic small vessel ischemia in keeping with age. Vascular: No hyperdense vessel or unexpected calcification. Skull: Normal. Negative for fracture or focal lesion. Sinuses/Orbits: Bilateral cataract resection Other: These results were communicated to [facility] at 9:23 amon 09/25/2021by text page via the [facility] messaging system. ASPECTS [facility]  Stroke Program Early CT Score) - Ganglionic level infarction (caudate, lentiform nuclei, internal capsule, insula, M1-M3 cortex): 7 - Supraganglionic infarction (M4-M6 cortex): 3 Total score (0-10 with 10 being normal): 10 IMPRESSION: 1. Aged brain without acute finding. 2. ASPECTS is 10.  No visible thalamic infarct. Electronically Signed   By: Monte Fantasia M.D.   On: 08/13/2020 09:25   CT ANGIO HEAD CODE STROKE  Result Date: 08/20/2020 CLINICAL DATA:  Stroke.  Left-sided weakness and slurred speech EXAM: CT ANGIOGRAPHY HEAD AND NECK CT PERFUSION BRAIN TECHNIQUE: Multidetector CT imaging of the head and neck was performed using the standard protocol during bolus administration of intravenous contrast. Multiplanar CT image reconstructions and MIPs were obtained to evaluate the vascular anatomy. Carotid stenosis measurements (when applicable) are obtained utilizing NASCET criteria, using the distal internal carotid diameter as the denominator. Multiphase CT imaging of the brain was performed following IV bolus contrast injection. Subsequent parametric perfusion maps were calculated using RAPID software. CONTRAST:  154mL OMNIPAQUE IOHEXOL 350 MG/ML SOLN COMPARISON:  CT head 08/02/2020 FINDINGS: CTA NECK FINDINGS Aortic arch: Atherosclerotic calcification throughout the aortic arch. Ascending aorta measures 42 mm in diameter. Descending thoracic aorta 26 mm in diameter. Negative for dissection. Eccentric plaque or thrombus of the distal arch measuring approximately 2 cm. This is distal to the left subclavian artery. Proximal great vessels patent without significant stenosis. Mild atherosclerotic disease at the origin the left common carotid and left subclavian artery. Right carotid system: Atherosclerotic calcification right carotid bifurcation. Less than 50% diameter stenosis proximal right internal carotid artery. Left carotid system: Atherosclerotic calcification in the carotid bulb. Adjacent to the  calcification, there is a low-density filling defect in the lumen attached to the wall. This could be thrombus or soft plaque. Less than 50% diameter stenosis proximal left internal carotid artery. Kinking of the left internal carotid artery just  above the plaque. Vertebral arteries: Both vertebral arteries patent to the basilar without stenosis. Skeleton: Anterolisthesis C3-4 and C4-5. 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 Disc degeneration and spurring C6-7. 4 mm anterolisthesis C7-T1 which appears degenerative. No acute skeletal abnormality. Other neck: Negative for mass or adenopathy. Upper chest: Widespread patchy small areas of infiltrate throughout the lungs bilaterally. Small bilateral pleural effusions. Review of the MIP images confirms the above findings CTA HEAD FINDINGS Anterior circulation: Occlusion right M1 segment distally. There is flow in the anterior and superior branches of the right middle cerebral artery due to collaterals with decreased opacification compared to the contralateral side. Both anterior cerebral arteries widely patent. Left middle cerebral artery widely patent without stenosis. Posterior circulation: Both vertebral arteries patent to the basilar. PICA patent on the right but not left. AICA, superior cerebellar, posterior cerebral arteries patent bilaterally without stenosis. Basilar patent without stenosis. Patent posterior communicating artery on the left. Venous sinuses: Normal venous enhancement. Anatomic variants: None Review of the MIP images confirms the above findings CT Brain Perfusion Findings: ASPECTS: 10 CBF (<30%) Volume: 14mL Perfusion (Tmax>6.0s) volume: 72mL Mismatch Volume: 39mL Infarction Location:Right MCA territory involving the temporal and parietal lobe. IMPRESSION: 1. CT perfusion positive for 98 mL ischemia in the right MCA territory involving the temporal and parietal lobe. No core infarct on CT perfusion. 2. Acute occlusion right distal M1 segment. There is collateral  flow in the right superior and inferior division of the MCA. 3. Less than 50% diameter stenosis proximal right internal carotid artery due to calcific plaque 4. Atherosclerotic calcification left carotid bulb. Associated intraluminal filling defect which may be thrombus or soft plaque. Less than 50% diameter stenosis proximal left internal carotid artery. 5. Both vertebral arteries patent to the basilar 6. Aortic Atherosclerosis (ICD10-I70.0). Aortic aneurysm. Ascending aorta 42 mm in diameter. 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 Recommend annual imaging followup by CTA or MRA. This recommendation follows 2010 ACCF/AHA/AATS/ACR/ASA/SCA/SCAI/SIR/STS/SVM Guidelines for the Diagnosis and Management of Patients with Thoracic Aortic Disease. Circulation. 2010; 121: Z610-R604. Aortic aneurysm NOS (ICD10-I71.9) 7. These results were called by telephone at the time of interpretation on 08/27/2020 at 9:54 am to provider [facility] , who verbally acknowledged these results. 8. Extensive patchy airspace disease bilaterally. Correlate with COVID testing. Small pleural effusions. Electronically Signed   By: Franchot Gallo M.D.   On: 08/19/2020 09:56   CT ANGIO NECK CODE STROKE  Result Date: 08/28/2020 CLINICAL DATA:  Stroke.  Left-sided weakness and slurred speech EXAM: CT ANGIOGRAPHY HEAD AND NECK CT PERFUSION BRAIN TECHNIQUE: Multidetector CT imaging of the head and neck was performed using the standard protocol during bolus administration of intravenous contrast. Multiplanar CT image reconstructions and MIPs were obtained to evaluate the vascular anatomy. Carotid stenosis measurements (when applicable) are obtained utilizing NASCET criteria, using the distal internal carotid diameter as the denominator. Multiphase CT imaging of the brain was performed following IV bolus contrast injection. Subsequent parametric perfusion maps were calculated using RAPID software.  CONTRAST:  161mL OMNIPAQUE IOHEXOL 350 MG/ML SOLN COMPARISON:  CT head 08/21/2020 FINDINGS: CTA NECK FINDINGS Aortic arch: Atherosclerotic calcification throughout the aortic arch. Ascending aorta measures 42 mm in diameter. Descending thoracic aorta 26 mm in diameter. Negative for dissection. Eccentric plaque or thrombus of the distal arch measuring approximately 2 cm. This is distal to the left subclavian artery. Proximal great vessels patent without significant stenosis. Mild atherosclerotic disease at the origin the left common carotid and left subclavian artery.  Right carotid system: Atherosclerotic calcification right carotid bifurcation. Less than 50% diameter stenosis proximal right internal carotid artery. Left carotid system: Atherosclerotic calcification in the carotid bulb. Adjacent to the calcification, there is a low-density filling defect in the lumen attached to the wall. This could be thrombus or soft plaque. Less than 50% diameter stenosis proximal left internal carotid artery. Kinking of the left internal carotid artery just above the plaque. Vertebral arteries: Both vertebral arteries patent to the basilar without stenosis. Skeleton: Anterolisthesis C3-4 and C4-5. 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 Disc degeneration and spurring C6-7. 4 mm anterolisthesis C7-T1 which appears degenerative. No acute skeletal abnormality. Other neck: Negative for mass or adenopathy. Upper chest: Widespread patchy small areas of infiltrate throughout the lungs bilaterally. Small bilateral pleural effusions. Review of the MIP images confirms the above findings CTA HEAD FINDINGS Anterior circulation: Occlusion right M1 segment distally. There is flow in the anterior and superior branches of the right middle cerebral artery due to collaterals with decreased opacification compared to the contralateral side. Both anterior cerebral arteries widely patent. Left middle cerebral artery widely patent without stenosis. Posterior  circulation: Both vertebral arteries patent to the basilar. PICA patent on the right but not left. AICA, superior cerebellar, posterior cerebral arteries patent bilaterally without stenosis. Basilar patent without stenosis. Patent posterior communicating artery on the left. Venous sinuses: Normal venous enhancement. Anatomic variants: None Review of the MIP images confirms the above findings CT Brain Perfusion Findings: ASPECTS: 10 CBF (<30%) Volume: 59mL Perfusion (Tmax>6.0s) volume: 71mL Mismatch Volume: 25mL Infarction Location:Right MCA territory involving the temporal and parietal lobe. IMPRESSION: 1. CT perfusion positive for 98 mL ischemia in the right MCA territory involving the temporal and parietal lobe. No core infarct on CT perfusion. 2. Acute occlusion right distal M1 segment. There is collateral flow in the right superior and inferior division of the MCA. 3. Less than 50% diameter stenosis proximal right internal carotid artery due to calcific plaque 4. Atherosclerotic calcification left carotid bulb. Associated intraluminal filling defect which may be thrombus or soft plaque. Less than 50% diameter stenosis proximal left internal carotid artery. 5. Both vertebral arteries patent to the basilar 6. Aortic Atherosclerosis (ICD10-I70.0). Aortic aneurysm. Ascending aorta 42 mm in diameter. 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 Recommend annual imaging followup by CTA or MRA. This recommendation follows 2010 ACCF/AHA/AATS/ACR/ASA/SCA/SCAI/SIR/STS/SVM Guidelines for the Diagnosis and Management of Patients with Thoracic Aortic Disease. Circulation. 2010; 121: W119-J478. Aortic aneurysm NOS (ICD10-I71.9) 7. These results were called by telephone at the time of interpretation on 08/18/2020 at 9:54 am to provider [facility] , who verbally acknowledged these results. 8. Extensive patchy airspace disease bilaterally. Correlate with COVID testing. Small pleural  effusions. Electronically Signed   By: Franchot Gallo M.D.   On: 08/14/2020 09:56    Cardiac Studies   Echo 05/2019: 1. Apical left ventricular aneurysm.  2. Large apical myocardial infarction.  3. Severe hypokinesis of the left ventricular, mid-apical anteroseptal  wall and anterior wall.  4. The left ventricle has moderately reduced systolic function, with an  ejection fraction of 35-40%. The cavity size was normal. Left ventricular  diastolic Doppler parameters are consistent with impaired relaxation.  5. The right ventricle has normal systolic function. The cavity was  normal. There is no increase in right ventricular wall thickness. Right  ventricular systolic pressure is mildly elevated with an estimated  pressure of 35.5 mmHg.  6. Left atrial size was mildly dilated.  7. The aortic  valve is tricuspid. Severely thickening of the aortic  valve. Severe calcification of the aortic valve. Aortic valve  regurgitation is trivial by color flow Doppler. Severe stenosis of the  aortic valve.  8. Aortic stenosis gradients are lower than expected due to reduced LV  systolic function.  9. There is mild dilatation of the ascending aorta measuring 44 mm.  10. No intracardiac thrombi or masses were visualized.  11. When compared to the prior study: compared to 2015 (report from  [facility]), there is a new left ventricular apical aneurysm, LVEF  is substantially lower and there is now severe AS.   Patient Profile     Ms.  Tavano is a 18F with medically managed CAD, chronic systolic and diastolic heart failure, severe AS, hypertension, hyperlipidemia, myasthenia gravis and newly diagnosed lung cancer admitted with stroke and new onset atrial fibrillation.  Assessment & Plan    # CVA:  New this admission.  Neurology is following.  She has new afib and likely embolic in etiology.  She also has an apical LV aneurysm.  She was not started on heparin due to concern for hemorrhagic  conversion.  She was started on aspirin and clopidogrel.  This is less likely to be effective for cardioembolic source and she is NPO due to mental status. Would start heparin as soon as deemed safe by neurology.  Suspect that she will not recover, but if she were discharged on oral medications would recommend Eliquis.  # PAF:  New onset.  Rate improving on digoxin.  We will switch to 0.25 mg IV daily.   Check level in two days.No beta blocker 2/2 Myasthenia.  Start heparin when able as above.  TSH is normal this admission.   # Acute on chronic systolic and diastolic heart failure:  LVEF 35-40% with focal wall motion abnormalities on echo 05/2019.  She has more LE edema lately that has responded to increased torsemide. Suspect her acute exacerbation is related to afib with RVR.  She remains volume overloaded.  BP is soft but improving.  Hypotension likely 2/2 sepsis (UTI, PNA) and cardiogenic).  Continue IV Lasix.  # CAD:  Noted on CT.  No ischemic work up given lack of symptoms and failure to thrive.   Medically managed with aspirin.  Clopidogrel added for now, but suspect this will be switched to anticoagulation prior to discharge given her new afib.  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  Continue to watch.  Increased from 4.2cm in one year.  Not a candidate for beta blocker 2/2 Myasthenia gravis.   # ?CAP: # Acute on chronic hypoxic respiratory failure: Respiratory status is worsening.  She has increasing oxygen requirements and is on 15 L.  She is DNR.  Diuresing as above.  However with her worsening leukocytosis would also wonder about underlying pneumonia.  She has been transitioned to comfort measures.  Overall prognosis is poor given her failure to thrive, acute on chronic hypoxic respiratory failure, and multiple co-morbidities with new stroke and atrial fibrillation.  We will continue to follow.      For questions or updates, please contact [place] Please consult  [email or web address] for contact info under        Signed, Skeet Latch, MD  08/04/2020, 8:48 AM

## 2020-08-04 NOTE — Progress Notes (Addendum)
PROGRESS NOTE    Tamara Gregory  DHR:[id number] DOB: [birth year] DOA: 08/11/2020 PCP: Binnie Rail, MD   Brief Narrative:  86 year old female with history of CHF EF 35%, chronic hypoxia on 2 L nasal cannula, worsening dementia, colon cancer status post partial colectomy, metastatic lung disease, osteoarthritis on chronic prednisone, hypothyroidism was brought to the the hospital with husband due to progressive decline and failure to thrive at home along with shortness of breath and confusion.  Upon admission she was found to have acute CVA with ICA thrombosis, atrial fibrillation with RVR, CHF exacerbation and urinary tract infection.   Assessment & Plan:   Principal Problem:   CVA (cerebral vascular accident) [facility]) Active Problems:   Depression   Essential hypertension, benign   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   Acute lower UTI   Hypoalbuminemia due to protein-calorie malnutrition ([place])   Exudative age-related macular degeneration (HCC)   invasive SCC (squamous cell carcinoma), leg, left   CAD (coronary artery disease)   PAF (paroxysmal atrial fibrillation) (HCC)   Sepsis (HCC)   Thoracic aortic aneurysm without rupture ([place])   Acute right MCA infarct with occlusion of right distal M1 -Unable to aggressively treat this given her multiple comorbidities.  Not a good candidate for thrombectomy at this time.  Antiplatelet therapy per neurology service. -A1c-6.5, LDL 44 -Supportive care -Echocardiogram-EF 30-35%, global hypokinesia, severe aortic valve stenosis -Neurochecks  Acute respiratory distress secondary to congestive heart failure with reduced ejection fraction, EF 35%, class IV Acute on chronic hypoxia, currently on 6 L nasal cannula -Patient appears to be in volume overload with elevated BNP -Lasix 40 mg IV twice daily -Echocardiogram-EF 30-35%, global hypokinesia, severe aortic valve stenosis -Supplemental oxygen as needed -Cardiology  following.  Severe aortic stenosis -Limited options at this time given her poor condition.  Unlikely a good candidate for TAVR versus palliative valvuloplasty  Upper airway secretions -No recent Scopolamine patch ordered.  Continue bronchodilators  Atrial fibrillation with RVR, improved -Currently rate controlled.  On digoxin  Sepsis present on admission as evidenced by tachycardia and leukocytosis. Urinary tract infection without hematuria -Cultures have been ordered -empiric IV Rocephin. wjich will be stopped since transitioning to comfort care.  Acute metabolic encephalopathy -Suspect secondary to underlying CVA and urinary tract infection.  Management as mentioned above -If her p.o. intake remains inconsistent, perform Accu-Cheks every 6 hours. - TSH-normal -ABG 9/2-unremarkable  History of colon cancer status post colectomy with suspicions of pulmonary nodule -Follows outpatient oncology team. -Change bronchodilators from albuterol to Xopenex and ipratropium  Adult failure to thrive with moderate protein calorie malnutrition -Currently n.p.o. due to her mentation.  Oral diet as tolerated  Chronic adrenal insufficiency She is on chronic steroids therefore continue hydrocortisone 50 mg every 8 hours for stress dose.  Goals of care discussion -Seen by palliative care service, patient has been transition to comfort care at this time.  Multiple labs ordered yesterday are still pending  DVT prophylaxis: Lovenox Code Status: DNR Family Communication: Husband at bedside  Status is: Inpatient  Remains inpatient appropriate because:Hemodynamically unstable   Dispo: The patient is from: Home              Anticipated d/c is to: SNF              Anticipated d/c date is: 1-2 days              Patient currently is not medically stable to d/c.  Patient has been  transitioned to comfort care, if she continues to decline at this rate, anticipated hospital death    Body mass  index is 27.44 kg/m.    Subjective: Patient was given morphine prior to my evaluation therefore she was drowsy.  Still remains on nonrebreather.  Husband is present at bedside  Review of Systems Otherwise negative except as per HPI, including: Difficult to obtain given her mentation  Examination: Constitutional: Appears ill on nonrebreather, she is lethargic. Respiratory: Bilateral diffuse rhonchi Cardiovascular: Sinus tachycardia with intermittent irregular beats Abdomen: Nontender nondistended good bowel sounds Musculoskeletal: 2+ bilateral lower extremity pitting edema Skin: No rashes seen Neurologic: Difficult to assess Psychiatric: Difficult to assess  Psychiatric: Poor mentation  Objective: Vitals:   08/03/20 1450 08/03/20 1935 08/03/20 2308 08/04/20 0307  BP: 125/75 (!) 187/77 (!) 140/97 (!) 147/92  Pulse: 94 83 86 (!) 102  Resp: 20 20 19 20   Temp: 98.9 F (37.2 C) 98.9 F (37.2 C) 98.1 F (36.7 C) 99 F (37.2 C)  TempSrc: Axillary Oral Oral Oral  SpO2: 97% 95% 95% 93%  Weight:      Height:        Intake/Output Summary (Last 24 hours) at 08/04/2020 0738 Last data filed at 08/03/2020 2308 Gross per 24 hour  Intake 763.31 ml  Output 1700 ml  Net -936.69 ml   Filed Weights   08/15/2020 1211  Weight: 68 kg     Data Reviewed:   CBC: Recent Labs  Lab 08/07/2020 0918 08/04/2020 0919 08/03/20 0248 08/03/20 1116  WBC  --  19.6* 27.2*  --   NEUTROABS  --  13.1* 22.3*  --   HGB 11.2* 9.9* 9.8* 11.2*  HCT 33.0* 33.7* 35.5* 33.0*  MCV  --  95.2 99.4  --   PLT  --  273 254  --    Basic Metabolic Panel: Recent Labs  Lab 08/30/2020 0918 08/29/2020 0919 08/03/20 0248 08/03/20 1116  NA 136 137 142 142  K 4.5 4.1 4.4 3.7  CL 91* 90* 100  --   CO2  --  32 28  --   GLUCOSE 156* 154* 153*  --   BUN 34* 24* 17  --   CREATININE 0.90 1.07* 0.81  --   CALCIUM  --  9.1 8.6*  --   MG  --   --  2.1  --    GFR: Estimated Creatinine Clearance: 39.3 mL/min (by C-G  formula based on SCr of 0.81 mg/dL). Liver Function Tests: Recent Labs  Lab 08/03/2020 0919 08/03/20 0248  AST 37 19  ALT 33 31  ALKPHOS 83 82  BILITOT 1.3* 1.2  PROT 6.7 6.3*  ALBUMIN 3.2* 3.0*   No results for input(s): LIPASE, AMYLASE in the last 168 hours. No results for input(s): AMMONIA in the last 168 hours. Coagulation Profile: Recent Labs  Lab 08/27/2020 0919  INR 1.3*   Cardiac Enzymes: No results for input(s): CKTOTAL, CKMB, CKMBINDEX, TROPONINI in the last 168 hours. BNP (last 3 results) Recent Labs    03/27/20 1529 05/19/20 1518 06/28/20 1048  PROBNP 3,893* 1,426.0* 968.0*   HbA1C: Recent Labs    08/03/20 0248  HGBA1C 6.5*   CBG: Recent Labs  Lab 08/03/20 1546 08/03/20 1808 08/03/20 2330 08/04/20 0636  GLUCAP 162* 153* 139* 163*   Lipid Profile: Recent Labs    08/03/20 0248  CHOL 105  HDL 38*  LDLCALC 44  TRIG 113  CHOLHDL 2.8   Thyroid Function Tests: Recent Labs  08/03/20 0248  TSH 2.409   Anemia Panel: No results for input(s): VITAMINB12, FOLATE, FERRITIN, TIBC, IRON, RETICCTPCT in the last 72 hours. Sepsis Labs: No results for input(s): PROCALCITON, LATICACIDVEN in the last 168 hours.  Recent Results (from the past 240 hour(s))  SARS Coronavirus 2 by RT PCR (hospital order, performed in [facility] hospital lab) Nasopharyngeal Nasopharyngeal Swab     Status: None   Collection Time: 08/31/2020 10:19 AM   Specimen: Nasopharyngeal Swab  Result Value Ref Range Status   SARS Coronavirus 2 NEGATIVE NEGATIVE Final    Comment: (NOTE) SARS-CoV-2 target nucleic acids are NOT DETECTED.  The SARS-CoV-2 RNA is generally detectable in upper and lower respiratory specimens during the acute phase of infection. The lowest concentration of SARS-CoV-2 viral copies this assay can detect is 250 copies / mL. A negative result does not preclude SARS-CoV-2 infection and should not be used as the sole basis for treatment or other patient management  decisions.  A negative result may occur with improper specimen collection / handling, submission of specimen other than nasopharyngeal swab, presence of viral mutation(s) within the areas targeted by this assay, and inadequate number of viral copies (<250 copies / mL). A negative result must be combined with clinical observations, patient history, and epidemiological information.  Fact Sheet for Patients:   StrictlyIdeas.no  Fact Sheet for Healthcare Providers: BankingDealers.co.za  This test is not yet approved or  cleared by the [place] FDA and has been authorized for detection and/or diagnosis of SARS-CoV-2 by FDA under an Emergency Use Authorization (EUA).  This EUA will remain in effect (meaning this test can be used) for the duration of the COVID-19 declaration under Section 564(b)(1) of the Act, 21 U.S.C. section 360bbb-3(b)(1), unless the authorization is terminated or revoked sooner.  Performed at [facility], [place] [address] [place], [place] [address]          Radiology Studies: CT CEREBRAL PERFUSION W CONTRAST  Result Date: 08/22/2020 CLINICAL DATA:  Stroke.  Left-sided weakness and slurred speech EXAM: CT ANGIOGRAPHY HEAD AND NECK CT PERFUSION BRAIN TECHNIQUE: Multidetector CT imaging of the head and neck was performed using the standard protocol during bolus administration of intravenous contrast. Multiplanar CT image reconstructions and MIPs were obtained to evaluate the vascular anatomy. Carotid stenosis measurements (when applicable) are obtained utilizing NASCET criteria, using the distal internal carotid diameter as the denominator. Multiphase CT imaging of the brain was performed following IV bolus contrast injection. Subsequent parametric perfusion maps were calculated using RAPID software. CONTRAST:  142mL OMNIPAQUE IOHEXOL 350 MG/ML SOLN COMPARISON:  CT head 08/16/2020 FINDINGS: CTA NECK FINDINGS Aortic  arch: Atherosclerotic calcification throughout the aortic arch. Ascending aorta measures 42 mm in diameter. Descending thoracic aorta 26 mm in diameter. Negative for dissection. Eccentric plaque or thrombus of the distal arch measuring approximately 2 cm. This is distal to the left subclavian artery. Proximal great vessels patent without significant stenosis. Mild atherosclerotic disease at the origin the left common carotid and left subclavian artery. Right carotid system: Atherosclerotic calcification right carotid bifurcation. Less than 50% diameter stenosis proximal right internal carotid artery. Left carotid system: Atherosclerotic calcification in the carotid bulb. Adjacent to the calcification, there is a low-density filling defect in the lumen attached to the wall. This could be thrombus or soft plaque. Less than 50% diameter stenosis proximal left internal carotid artery. Kinking of the left internal carotid artery just above the plaque. Vertebral arteries: Both vertebral arteries patent to the basilar  without stenosis. Skeleton: Anterolisthesis C3-4 and C4-5. 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 Disc degeneration and spurring C6-7. 4 mm anterolisthesis C7-T1 which appears degenerative. No acute skeletal abnormality. Other neck: Negative for mass or adenopathy. Upper chest: Widespread patchy small areas of infiltrate throughout the lungs bilaterally. Small bilateral pleural effusions. Review of the MIP images confirms the above findings CTA HEAD FINDINGS Anterior circulation: Occlusion right M1 segment distally. There is flow in the anterior and superior branches of the right middle cerebral artery due to collaterals with decreased opacification compared to the contralateral side. Both anterior cerebral arteries widely patent. Left middle cerebral artery widely patent without stenosis. Posterior circulation: Both vertebral arteries patent to the basilar. PICA patent on the right but not left. AICA, superior cerebellar,  posterior cerebral arteries patent bilaterally without stenosis. Basilar patent without stenosis. Patent posterior communicating artery on the left. Venous sinuses: Normal venous enhancement. Anatomic variants: None Review of the MIP images confirms the above findings CT Brain Perfusion Findings: ASPECTS: 10 CBF (<30%) Volume: 16mL Perfusion (Tmax>6.0s) volume: 53mL Mismatch Volume: 35mL Infarction Location:Right MCA territory involving the temporal and parietal lobe. IMPRESSION: 1. CT perfusion positive for 98 mL ischemia in the right MCA territory involving the temporal and parietal lobe. No core infarct on CT perfusion. 2. Acute occlusion right distal M1 segment. There is collateral flow in the right superior and inferior division of the MCA. 3. Less than 50% diameter stenosis proximal right internal carotid artery due to calcific plaque 4. Atherosclerotic calcification left carotid bulb. Associated intraluminal filling defect which may be thrombus or soft plaque. Less than 50% diameter stenosis proximal left internal carotid artery. 5. Both vertebral arteries patent to the basilar 6. Aortic Atherosclerosis (ICD10-I70.0). Aortic aneurysm. Ascending aorta 42 mm in diameter. 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 Recommend annual imaging followup by CTA or MRA. This recommendation follows 2010 ACCF/AHA/AATS/ACR/ASA/SCA/SCAI/SIR/STS/SVM Guidelines for the Diagnosis and Management of Patients with Thoracic Aortic Disease. Circulation. 2010; 121: W620-B559. Aortic aneurysm NOS (ICD10-I71.9) 7. These results were called by telephone at the time of interpretation on 08/30/2020 at 9:54 am to provider [facility] , who verbally acknowledged these results. 8. Extensive patchy airspace disease bilaterally. Correlate with COVID testing. Small pleural effusions. Electronically Signed   By: Franchot Gallo M.D.   On: 08/08/2020 09:56   DG CHEST PORT 1 VIEW  Result Date:  08/03/2020 CLINICAL DATA:  Dyspnea EXAM: PORTABLE CHEST 1 VIEW COMPARISON:  08/04/2020 FINDINGS: Single frontal view of the chest demonstrates an enlarged cardiac silhouette. Stable ectasia of the thoracic aorta with marked atherosclerosis. Persistent central vascular congestion with bilateral left greater than right airspace disease. The densest consolidation is within the left lower lobe. Suspected left effusion unchanged. No pneumothorax. IMPRESSION: 1. Constellation of findings most suggestive of congestive heart failure. Superimposed infection would be difficult to exclude. Slight progression since prior study. Electronically Signed   By: Randa Ngo M.D.   On: 08/03/2020 21:48   DG Chest Portable 1 View  Result Date: 08/07/2020 CLINICAL DATA:  Shortness of breath, leukocytosis EXAM: PORTABLE CHEST 1 VIEW COMPARISON:  03/27/2020 FINDINGS: Stable cardiomegaly. Known thoracic aortic aneurysm with extensive atherosclerosis. Patchy airspace opacity at the left lung base with small left pleural effusion. There are prominent interstitial opacities throughout both lungs. No pneumothorax. Spinal stimulator leads within the thoracic spine are again noted. IMPRESSION: 1. Patchy airspace opacity at the left lung base with small left pleural effusion, concerning for pneumonia. 2. Prominent interstitial opacities throughout  both lungs may represent edema versus atypical/viral infection. Electronically Signed   By: Davina Poke D.O.   On: 08/04/2020 10:48   ECHOCARDIOGRAM COMPLETE  Result Date: 08/03/2020    ECHOCARDIOGRAM REPORT   Patient Name:   PRISILLA KOCSIS Date of Exam: 08/03/2020 Medical Rec #:  [id number]      Height:       62.0 in Accession #:    [id number]     Weight:       150.0 lb Date of Birth:  [birth year]      BSA:          1.692 m Patient Age:    71 years       BP:           123/86 mmHg Patient Gender: F              HR:           98 bpm. Exam Location:  Inpatient Procedure: 2D Echo, Cardiac Doppler,  Color Doppler and Intracardiac            Opacification Agent Indications:    TIA 435.09 / G45.96  History:        Patient has prior history of Echocardiogram examinations, most                 recent 05/11/2019. Risk Factors:Hypertension and Dyslipidemia.                 GERD. Cancer.  Sonographer:    Jonelle Sidle Dance Referring Phys: [id number] [place]  1. Apex, septum and mid ventricular function down. Basal function preserved . Left ventricular ejection fraction, by estimation, is 30 to 35%. The left ventricle has moderately decreased function. The left ventricle demonstrates global hypokinesis. The left ventricular internal cavity size was mildly to moderately dilated. Left ventricular diastolic parameters are indeterminate.  2. Right ventricular systolic function is normal. The right ventricular size is normal. There is moderately elevated pulmonary artery systolic pressure.  3. Left atrial size was mildly dilated.  4. The mitral valve is degenerative. Mild mitral valve regurgitation. No evidence of mitral stenosis.  5. Tricuspid valve regurgitation is mild to moderate.  6. Severe low flow AS AVA 0.72 cm2 and DVI 0.25. The aortic valve is tricuspid. Aortic valve regurgitation is trivial. Severe aortic valve stenosis.  7. Aortic dilatation noted. There is moderate dilatation of the ascending aorta measuring 44 mm.  8. The inferior vena cava is normal in size with greater than 50% respiratory variability, suggesting right atrial pressure of 3 mmHg. FINDINGS  Left Ventricle: Apex, septum and mid ventricular function down. Basal function preserved. Left ventricular ejection fraction, by estimation, is 30 to 35%. The left ventricle has moderately decreased function. The left ventricle demonstrates global hypokinesis. Definity contrast agent was given IV to delineate the left ventricular endocardial borders. The left ventricular internal cavity size was mildly to moderately dilated. There is no left  ventricular hypertrophy. Left ventricular diastolic parameters are indeterminate. Right Ventricle: The right ventricular size is normal. No increase in right ventricular wall thickness. Right ventricular systolic function is normal. There is moderately elevated pulmonary artery systolic pressure. The tricuspid regurgitant velocity is 2.96 m/s, and with an assumed right atrial pressure of 15 mmHg, the estimated right ventricular systolic pressure is 34.7 mmHg. Left Atrium: Left atrial size was mildly dilated. Right Atrium: Right atrial size was normal in size. Pericardium: Trivial pericardial effusion is present. The pericardial effusion is posterior and  lateral to the left ventricle. Mitral Valve: The mitral valve is degenerative in appearance. There is moderate thickening of the mitral valve leaflet(s). There is moderate calcification of the mitral valve leaflet(s). Normal mobility of the mitral valve leaflets. Moderate mitral annular calcification. Mild mitral valve regurgitation. No evidence of mitral valve stenosis. Tricuspid Valve: The tricuspid valve is normal in structure. Tricuspid valve regurgitation is mild to moderate. No evidence of tricuspid stenosis. Aortic Valve: Severe low flow AS AVA 0.72 cm2 and DVI 0.25. The aortic valve is tricuspid. . There is severe thickening and severe calcifcation of the aortic valve. Aortic valve regurgitation is trivial. Severe aortic stenosis is present. Severe aortic valve annular calcification. There is severe thickening of the aortic valve. There is severe calcifcation of the aortic valve. Aortic valve mean gradient measures 25.3 mmHg. Aortic valve peak gradient measures 40.0 mmHg. Aortic valve area, by VTI measures 0.70 cm. Pulmonic Valve: The pulmonic valve was normal in structure. Pulmonic valve regurgitation is not visualized. No evidence of pulmonic stenosis. Aorta: The aortic root is normal in size and structure and aortic dilatation noted. There is moderate  dilatation of the ascending aorta measuring 44 mm. Venous: The inferior vena cava is normal in size with greater than 50% respiratory variability, suggesting right atrial pressure of 3 mmHg. IAS/Shunts: No atrial level shunt detected by color flow Doppler.  LEFT VENTRICLE PLAX 2D LVIDd:         4.40 cm LVIDs:         3.30 cm LV PW:         1.10 cm LV IVS:        1.00 cm LVOT diam:     1.90 cm LV SV:         44 LV SV Index:   26 LVOT Area:     2.84 cm  RIGHT VENTRICLE          IVC RV Basal diam:  2.30 cm  IVC diam: 2.20 cm TAPSE (M-mode): 1.2 cm LEFT ATRIUM             Index       RIGHT ATRIUM           Index LA diam:        4.20 cm 2.48 cm/m  RA Area:     18.50 cm LA Vol (A2C):   93.7 ml 55.39 ml/m RA Volume:   48.00 ml  28.37 ml/m LA Vol (A4C):   74.4 ml 43.98 ml/m LA Biplane Vol: 89.4 ml 52.85 ml/m  AORTIC VALVE AV Area (Vmax):    0.73 cm AV Area (Vmean):   0.72 cm AV Area (VTI):     0.70 cm AV Vmax:           316.33 cm/s AV Vmean:          240.667 cm/s AV VTI:            0.631 m AV Peak Grad:      40.0 mmHg AV Mean Grad:      25.3 mmHg LVOT Vmax:         80.90 cm/s LVOT Vmean:        61.150 cm/s LVOT VTI:          0.156 m LVOT/AV VTI ratio: 0.25  AORTA Ao Root diam: 3.10 cm Ao Asc diam:  4.40 cm MITRAL VALVE                TRICUSPID VALVE MV Area (PHT): 3.91  cm     TR Peak grad:   35.0 mmHg MV Decel Time: 194 msec     TR Vmax:        296.00 cm/s MV E velocity: 152.00 cm/s MV A velocity: 61.60 cm/s   SHUNTS MV E/A ratio:  2.47         Systemic VTI:  0.16 m                             Systemic Diam: 1.90 cm Jenkins Rouge MD Electronically signed by Jenkins Rouge MD Signature Date/Time: 08/03/2020/12:48:38 PM    Final    CT HEAD CODE STROKE WO CONTRAST  Result Date: 08/23/2020 CLINICAL DATA:  Code stroke.  Flaccid left side EXAM: CT HEAD WITHOUT CONTRAST TECHNIQUE: Contiguous axial images were obtained from the base of the skull through the vertex without intravenous contrast. COMPARISON:  03/10/2019  FINDINGS: Brain: No evidence of acute infarction, hemorrhage, hydrocephalus, extra-axial collection or mass lesion/mass effect. Atrophy and chronic small vessel ischemia in keeping with age. Vascular: No hyperdense vessel or unexpected calcification. Skull: Normal. Negative for fracture or focal lesion. Sinuses/Orbits: Bilateral cataract resection Other: These results were communicated to [facility] at 9:23 amon 09/30/2021by text page via the [facility] messaging system. ASPECTS [facility] Stroke Program Early CT Score) - Ganglionic level infarction (caudate, lentiform nuclei, internal capsule, insula, M1-M3 cortex): 7 - Supraganglionic infarction (M4-M6 cortex): 3 Total score (0-10 with 10 being normal): 10 IMPRESSION: 1. Aged brain without acute finding. 2. ASPECTS is 10.  No visible thalamic infarct. Electronically Signed   By: Monte Fantasia M.D.   On: 08/29/2020 09:25   CT ANGIO HEAD CODE STROKE  Result Date: 08/31/2020 CLINICAL DATA:  Stroke.  Left-sided weakness and slurred speech EXAM: CT ANGIOGRAPHY HEAD AND NECK CT PERFUSION BRAIN TECHNIQUE: Multidetector CT imaging of the head and neck was performed using the standard protocol during bolus administration of intravenous contrast. Multiplanar CT image reconstructions and MIPs were obtained to evaluate the vascular anatomy. Carotid stenosis measurements (when applicable) are obtained utilizing NASCET criteria, using the distal internal carotid diameter as the denominator. Multiphase CT imaging of the brain was performed following IV bolus contrast injection. Subsequent parametric perfusion maps were calculated using RAPID software. CONTRAST:  124mL OMNIPAQUE IOHEXOL 350 MG/ML SOLN COMPARISON:  CT head 08/02/2020 FINDINGS: CTA NECK FINDINGS Aortic arch: Atherosclerotic calcification throughout the aortic arch. Ascending aorta measures 42 mm in diameter. Descending thoracic aorta 26 mm in diameter. Negative for dissection. Eccentric plaque or thrombus of the distal  arch measuring approximately 2 cm. This is distal to the left subclavian artery. Proximal great vessels patent without significant stenosis. Mild atherosclerotic disease at the origin the left common carotid and left subclavian artery. Right carotid system: Atherosclerotic calcification right carotid bifurcation. Less than 50% diameter stenosis proximal right internal carotid artery. Left carotid system: Atherosclerotic calcification in the carotid bulb. Adjacent to the calcification, there is a low-density filling defect in the lumen attached to the wall. This could be thrombus or soft plaque. Less than 50% diameter stenosis proximal left internal carotid artery. Kinking of the left internal carotid artery just above the plaque. Vertebral arteries: Both vertebral arteries patent to the basilar without stenosis. Skeleton: Anterolisthesis C3-4 and C4-5. 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 Disc degeneration and spurring C6-7. 4 mm anterolisthesis C7-T1 which appears degenerative. No acute skeletal abnormality. Other neck: Negative for mass or adenopathy. Upper chest: Widespread patchy small areas of infiltrate throughout the  lungs bilaterally. Small bilateral pleural effusions. Review of the MIP images confirms the above findings CTA HEAD FINDINGS Anterior circulation: Occlusion right M1 segment distally. There is flow in the anterior and superior branches of the right middle cerebral artery due to collaterals with decreased opacification compared to the contralateral side. Both anterior cerebral arteries widely patent. Left middle cerebral artery widely patent without stenosis. Posterior circulation: Both vertebral arteries patent to the basilar. PICA patent on the right but not left. AICA, superior cerebellar, posterior cerebral arteries patent bilaterally without stenosis. Basilar patent without stenosis. Patent posterior communicating artery on the left. Venous sinuses: Normal venous enhancement. Anatomic variants: None  Review of the MIP images confirms the above findings CT Brain Perfusion Findings: ASPECTS: 10 CBF (<30%) Volume: 28mL Perfusion (Tmax>6.0s) volume: 75mL Mismatch Volume: 21mL Infarction Location:Right MCA territory involving the temporal and parietal lobe. IMPRESSION: 1. CT perfusion positive for 98 mL ischemia in the right MCA territory involving the temporal and parietal lobe. No core infarct on CT perfusion. 2. Acute occlusion right distal M1 segment. There is collateral flow in the right superior and inferior division of the MCA. 3. Less than 50% diameter stenosis proximal right internal carotid artery due to calcific plaque 4. Atherosclerotic calcification left carotid bulb. Associated intraluminal filling defect which may be thrombus or soft plaque. Less than 50% diameter stenosis proximal left internal carotid artery. 5. Both vertebral arteries patent to the basilar 6. Aortic Atherosclerosis (ICD10-I70.0). Aortic aneurysm. Ascending aorta 42 mm in diameter. 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 Recommend annual imaging followup by CTA or MRA. This recommendation follows 2010 ACCF/AHA/AATS/ACR/ASA/SCA/SCAI/SIR/STS/SVM Guidelines for the Diagnosis and Management of Patients with Thoracic Aortic Disease. Circulation. 2010; 121: R485-I627. Aortic aneurysm NOS (ICD10-I71.9) 7. These results were called by telephone at the time of interpretation on 08/18/2020 at 9:54 am to provider [facility] , who verbally acknowledged these results. 8. Extensive patchy airspace disease bilaterally. Correlate with COVID testing. Small pleural effusions. Electronically Signed   By: Franchot Gallo M.D.   On: 08/27/2020 09:56   CT ANGIO NECK CODE STROKE  Result Date: 08/16/2020 CLINICAL DATA:  Stroke.  Left-sided weakness and slurred speech EXAM: CT ANGIOGRAPHY HEAD AND NECK CT PERFUSION BRAIN TECHNIQUE: Multidetector CT imaging of the head and neck was performed using the  standard protocol during bolus administration of intravenous contrast. Multiplanar CT image reconstructions and MIPs were obtained to evaluate the vascular anatomy. Carotid stenosis measurements (when applicable) are obtained utilizing NASCET criteria, using the distal internal carotid diameter as the denominator. Multiphase CT imaging of the brain was performed following IV bolus contrast injection. Subsequent parametric perfusion maps were calculated using RAPID software. CONTRAST:  195mL OMNIPAQUE IOHEXOL 350 MG/ML SOLN COMPARISON:  CT head 08/19/2020 FINDINGS: CTA NECK FINDINGS Aortic arch: Atherosclerotic calcification throughout the aortic arch. Ascending aorta measures 42 mm in diameter. Descending thoracic aorta 26 mm in diameter. Negative for dissection. Eccentric plaque or thrombus of the distal arch measuring approximately 2 cm. This is distal to the left subclavian artery. Proximal great vessels patent without significant stenosis. Mild atherosclerotic disease at the origin the left common carotid and left subclavian artery. Right carotid system: Atherosclerotic calcification right carotid bifurcation. Less than 50% diameter stenosis proximal right internal carotid artery. Left carotid system: Atherosclerotic calcification in the carotid bulb. Adjacent to the calcification, there is a low-density filling defect in the lumen attached to the wall. This could be thrombus or soft plaque. Less than 50% diameter  stenosis proximal left internal carotid artery. Kinking of the left internal carotid artery just above the plaque. Vertebral arteries: Both vertebral arteries patent to the basilar without stenosis. Skeleton: Anterolisthesis C3-4 and C4-5. 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 Disc degeneration and spurring C6-7. 4 mm anterolisthesis C7-T1 which appears degenerative. No acute skeletal abnormality. Other neck: Negative for mass or adenopathy. Upper chest: Widespread patchy small areas of infiltrate throughout the  lungs bilaterally. Small bilateral pleural effusions. Review of the MIP images confirms the above findings CTA HEAD FINDINGS Anterior circulation: Occlusion right M1 segment distally. There is flow in the anterior and superior branches of the right middle cerebral artery due to collaterals with decreased opacification compared to the contralateral side. Both anterior cerebral arteries widely patent. Left middle cerebral artery widely patent without stenosis. Posterior circulation: Both vertebral arteries patent to the basilar. PICA patent on the right but not left. AICA, superior cerebellar, posterior cerebral arteries patent bilaterally without stenosis. Basilar patent without stenosis. Patent posterior communicating artery on the left. Venous sinuses: Normal venous enhancement. Anatomic variants: None Review of the MIP images confirms the above findings CT Brain Perfusion Findings: ASPECTS: 10 CBF (<30%) Volume: 43mL Perfusion (Tmax>6.0s) volume: 14mL Mismatch Volume: 25mL Infarction Location:Right MCA territory involving the temporal and parietal lobe. IMPRESSION: 1. CT perfusion positive for 98 mL ischemia in the right MCA territory involving the temporal and parietal lobe. No core infarct on CT perfusion. 2. Acute occlusion right distal M1 segment. There is collateral flow in the right superior and inferior division of the MCA. 3. Less than 50% diameter stenosis proximal right internal carotid artery due to calcific plaque 4. Atherosclerotic calcification left carotid bulb. Associated intraluminal filling defect which may be thrombus or soft plaque. Less than 50% diameter stenosis proximal left internal carotid artery. 5. Both vertebral arteries patent to the basilar 6. Aortic Atherosclerosis (ICD10-I70.0). Aortic aneurysm. Ascending aorta 42 mm in diameter. 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 Recommend annual imaging followup by CTA or MRA. This recommendation  follows 2010 ACCF/AHA/AATS/ACR/ASA/SCA/SCAI/SIR/STS/SVM Guidelines for the Diagnosis and Management of Patients with Thoracic Aortic Disease. Circulation. 2010; 121: U045-W098. Aortic aneurysm NOS (ICD10-I71.9) 7. These results were called by telephone at the time of interpretation on 08/10/2020 at 9:54 am to provider [facility] , who verbally acknowledged these results. 8. Extensive patchy airspace disease bilaterally. Correlate with COVID testing. Small pleural effusions. Electronically Signed   By: Franchot Gallo M.D.   On: 08/14/2020 09:56        Scheduled Meds: .  stroke: mapping our early stages of recovery book   Does not apply Once  . aspirin  324 mg Oral Daily  . atorvastatin  80 mg Oral Daily  . brimonidine  1 drop Both Eyes TID  . clopidogrel  75 mg Oral Daily  . enoxaparin (LOVENOX) injection  30 mg Subcutaneous Q24H  . fluticasone  1 spray Each Nare QHS  . furosemide  40 mg Intravenous BID  . guaiFENesin  600 mg Oral BID  . hydrocortisone sod succinate (SOLU-CORTEF) inj  50 mg Intravenous Q8H  . ipratropium  0.5 mg Nebulization BID  . latanoprost  1 drop Left Eye QHS  . levalbuterol  1.25 mg Nebulization BID  . pantoprazole (PROTONIX) IV  40 mg Intravenous Daily  . scopolamine  1 patch Transdermal Q72H  . sertraline  100 mg Oral Daily   Continuous Infusions: . cefTRIAXone (ROCEPHIN)  IV Stopped (08/03/20 1008)     LOS: 2  days   Time spent= 35 mins    Jaquavis Felmlee Arsenio Loader, MD [facility]  If 7PM-7AM, please contact night-coverage  08/04/2020, 7:38 AM

## 2020-08-04 NOTE — Progress Notes (Signed)
PT Cancellation/Discharge Note  Patient Details Name: FARON WHITELOCK MRN: [id number] DOB: 20-Feb-1925   Cancelled Treatment:    Reason Eval/Treat Not Completed: Other (comment).  Pt is now full comfort care.  PT orders discontinued.  PT to sign off.   Thanks,  Verdene Lennert, PT, DPT  Acute Rehabilitation [phone] pager #[phone] office       Wells Guiles B Otie Headlee 08/04/2020, 2:09 PM

## 2020-08-04 NOTE — Progress Notes (Signed)
STROKE TEAM PROGRESS NOTE   INTERVAL HISTORY Patient in bed, her husband and daughter-in-law and nephew are at bedside.. Neurological condition is unchanged.  Patient is unresponsive and has dense left hemiplegia and does move right side spontaneously.  Vitals:   08/03/20 1935 08/03/20 2308 08/04/20 0307 08/04/20 0752  BP: (!) 187/77 (!) 140/97 (!) 147/92 (!) 103/91  Pulse: 83 86 (!) 102 (!) 108  Resp: 20 19 20 20   Temp: 98.9 F (37.2 C) 98.1 F (36.7 C) 99 F (37.2 C) (!) 100.9 F (38.3 C)  TempSrc: Oral Oral Oral Axillary  SpO2: 95% 95% 93% 92%  Weight:      Height:       CBC:  Recent Labs  Lab 08/04/2020 0919 08/21/2020 0919 08/03/20 0248 08/03/20 1116  WBC 19.6*  --  27.2*  --   NEUTROABS 13.1*  --  22.3*  --   HGB 9.9*   < > 9.8* 11.2*  HCT 33.7*   < > 35.5* 33.0*  MCV 95.2  --  99.4  --   PLT 273  --  254  --    < > = values in this interval not displayed.   Basic Metabolic Panel:  Recent Labs  Lab 08/03/2020 0919 08/29/2020 0919 08/03/20 0248 08/03/20 1116  NA 137   < > 142 142  K 4.1   < > 4.4 3.7  CL 90*  --  100  --   CO2 32  --  28  --   GLUCOSE 154*  --  153*  --   BUN 24*  --  17  --   CREATININE 1.07*  --  0.81  --   CALCIUM 9.1  --  8.6*  --   MG  --   --  2.1  --    < > = values in this interval not displayed.   Lipid Panel:  Recent Labs  Lab 08/03/20 0248  CHOL 105  TRIG 113  HDL 38*  CHOLHDL 2.8  VLDL 23  LDLCALC 44   HgbA1c:  Recent Labs  Lab 08/03/20 0248  HGBA1C 6.5*   Urine Drug Screen:  Recent Labs  Lab 08/03/20 0230  LABOPIA POSITIVE*  COCAINSCRNUR NONE DETECTED  LABBENZ NONE DETECTED  AMPHETMU NONE DETECTED  THCU NONE DETECTED  LABBARB NONE DETECTED    Alcohol Level  Recent Labs  Lab 08/16/2020 0919  ETH <10    IMAGING past 24 hours DG CHEST PORT 1 VIEW  Result Date: 08/03/2020 CLINICAL DATA:  Dyspnea EXAM: PORTABLE CHEST 1 VIEW COMPARISON:  08/17/2020 FINDINGS: Single frontal view of the chest demonstrates an  enlarged cardiac silhouette. Stable ectasia of the thoracic aorta with marked atherosclerosis. Persistent central vascular congestion with bilateral left greater than right airspace disease. The densest consolidation is within the left lower lobe. Suspected left effusion unchanged. No pneumothorax. IMPRESSION: 1. Constellation of findings most suggestive of congestive heart failure. Superimposed infection would be difficult to exclude. Slight progression since prior study. Electronically Signed   By: Randa Ngo M.D.   On: 08/03/2020 21:48    PHYSICAL EXAM Frail cachectic malnourished looking elderly lady in mild respiratory distress on nonrebreather oxygen. . Afebrile. Head is nontraumatic. Neck is supple without bruit.    Cardiac exam no murmur or gallop. Lungs are clear to auscultation. Distal pulses are well felt. Neurological Exam : She is lethargic but does open eyes and tries to speak and mumbles but speech is mostly nonsensical and difficult to understand.  Right gaze preference and not able to look to the left past midline.  She blinks to threat more on the right than the left.  Left facial weakness.  Tongue midline.  Motor system exam shows spontaneous antigravity movements in the right upper and lower extremity but she has dense left hemiplegia with hypotonia.  Trace withdrawal in the left lower extremity to pain but none in the left upper extremity.  Left plantar upgoing right downgoing.  Gait not tested. ASSESSMENT/PLAN Tamara Gregory is a 86 y.o. female  with PMH significant for Anemia, colon cancer s/p R colectomy, GERD, HLD, HTN, Migraine, Myasthenia, venous stasis dermatitis who presents with acute onset Left sided weakness. Husband reports that she went to bed at 2200 on 08/01/20 and in the AM, was trying to wake him up and wanting to get out of bed. She is bed and wheel chair bound at baseline and needs helkp with transfer. He got out of bed at 7am to get her to move but noted that  the patient could not sit up. She appeared off. He called EMS and she was noted to be leaning to her left side. She was brought in as a stroke code. She was noted to have afib on rhythm strip during transport per EMS.   Stroke: Right MCA infarct embolic Code Stroke CT head No acute abnormality ASPECTS 10.   CTA head & neck & perfusion : 1. CT perfusion positive for 98 mL ischemia in the right MCA territory involving the temporal and parietal lobe. No core infarct on CT perfusion. 2. Acute occlusion right distal M1 segment. There is collateral flow in the right superior and inferior division of the MCA. 3. Less than 50% diameter stenosis proximal right internal carotid artery due to calcific plaque 4. Atherosclerotic calcification left carotid bulb. Associated intraluminal filling defect which may be thrombus or soft plaque. Less than 50% diameter stenosis proximal left internal carotid artery.5. Both vertebral arteries patent to the basilar   2D Echo 1. Apex, septum and mid ventricular function down. Basal function  preserved . Left ventricular ejection fraction, by estimation, is 30 to  35%. The left ventricle has moderately decreased function. The left  ventricle demonstrates global hypokinesis. The  left ventricular internal cavity size was mildly to moderately dilated.  Left ventricular diastolic parameters are indeterminate.  2. Right ventricular systolic function is normal. The right ventricular  size is normal. There is moderately elevated pulmonary artery systolic  pressure.  3. Left atrial size was mildly dilated.  4. The mitral valve is degenerative. Mild mitral valve regurgitation. No  evidence of mitral stenosis.  5. Tricuspid valve regurgitation is mild to moderate.  6. Severe low flow AS AVA 0.72 cm2 and DVI 0.25. The aortic valve is  tricuspid. Aortic valve regurgitation is trivial. Severe aortic valve  stenosis.  7. Aortic dilatation noted. There is moderate  dilatation of the ascending  aorta measuring 44 mm.  8. The inferior vena cava is normal in size with greater than 50%  respiratory variability, suggesting right atrial pressure of 3 mmHg.   LDL 44  HgbA1c 6.5  VTE prophylaxis - lovenox    Diet   Diet regular Room service appropriate? Yes; Fluid consistency: Thin    aspirin 81 mg daily prior to admission, now on aspirin 81 mg daily and clopidogrel 75 mg daily.   Therapy recommendations:  pending  Disposition:  pending  Hypertension  Home meds:  losartan  Stable . Permissive hypertension (  OK if < 220/120) but gradually normalize in 5-7 days . Long-term BP goal normotensive  Hyperlipidemia  Home meds:  crestor   LDL 44, goal < 70  Diabetes type II Controlled  Home meds:  none  HgbA1c 6.5, goal < 7.0 Other Stroke Risk Factors  Advanced age  Migraines  Other Active Problems did pass a swallow  Hospital day # 2 Patient presented with a right M1 occlusion and large right MCA infarct likely from underlying A. fib and congestive heart failure.  She presented outside time window for TPA but within timeframe for endovascular intervention but she has a poor neurological baseline modified Rankin of 4 requiring help with in bed and she is not considered a candidate for intervention.  Her neurological prognosis is quite poor given large stroke, advanced age and poor baseline and I feel palliative care approach would be more appropriate.  I spoke to her husband and daughter-in-law and nephew at the bedside.  Husband does not want to suffer and is leaning towards palliative care but needs time to decide whether he can do this at home or move her to the palliative care nursing home.  Discussed with Dr. Reesa Chew.  Greater than 50% time during this 25-minute visit was spent in counseling and coordination of care and discussion with care team.  Stroke team will sign off.  Kindly call for questions  Antony Contras, MD  To contact Stroke  Continuity provider, please refer to [email or web address] After hours, contact General Neurology

## 2020-08-04 NOTE — Progress Notes (Signed)
Manufacturing engineer [facility]) [place]  Discussed with TOC and PMT about discussing Optometrist for EOL.   Pt is currently on a morphine gtt and may not be stable to transport to [facility].  This Probation officer will meet with the husband Saturday am to see if this is something they are interested in and follow up with PMT team.  Venia Carbon RN, BSN, [facility] Liaison

## 2020-08-04 NOTE — Progress Notes (Signed)
CSW met with family to discuss plan of care. Per MD, patient not stable for transition outside of the hospital at this time. CSW explained to family and discussed how the medical team will reevaluate tomorrow if the patient stabilizes. Family would be interested in [facility] for hospice care if the patient were able to make the transition. CSW sent referral to [facility] to discuss with family, if appropriate.  Laveda Abbe, [place] Clinical Social Worker [phone]

## 2020-08-04 NOTE — Consult Note (Signed)
Palliative Medicine Inpatient Consult Note  Reason for consult:  Goals of Care "CHF, metastatic lung CA, came with stroke and afib"  HPI:  Per intake H&P --> 86 year old female with history of CHF EF 35%, chronic hypoxia on 2 L nasal cannula, worsening dementia, colon cancer status post partial colectomy, metastatic lung disease, osteoarthritis on chronic prednisone, myasthenia gravis, hypothyroidism was brought to the the hospital with husband due to progressive decline and failure to thrive at home along with shortness of breath and confusion.  Upon admission she was found to have acute CVA with ICA thrombosis, atrial fibrillation with RVR, CHF exacerbation and urinary tract infection.  Palliative care was asked to help aid in goals of care conversations and symptom management in the setting of metastatic CA. She has suffered a functional decline over the past few months and has had ongoing FTT.   Clinical Assessment/Goals of Care: I have reviewed medical records including EPIC notes, labs and imaging, received report from bedside RN, assessed the patient. Lidiya was lying in bed with a non-rebreather on. She appeared very tachypneic and was moaning recurrently.    I met with Ray (husband) at bedside to further discuss diagnosis prognosis, GOC, EOL wishes, disposition and options.   I introduced Palliative Medicine as specialized medical care for people living with serious illness. It focuses on providing relief from the symptoms and stress of a serious illness. The goal is to improve quality of life for both the patient and the family.  I asked Ray to tell me about Tamara Gregory. He describes her as a very special woman. They have been married for seventy one years and share two children. 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 She is described as a strong and faithful woman. Ray is overwhelmed with emotion as he describes her to me.  Prior to  hospitalization, Billijo had been declining for sometime physically to now being bedbound. She has also had a decrease in her appetite and had been noted to have new nodules in her lungs thought to be r/t metastatic disease.   A detailed discussion was had today regarding advanced directives, there are non formally on file though Margit's husband Ray has acted as her Media planner in the setting of her dementia.    Concepts specific to code status, artifical feeding and hydration, continued IV antibiotics and rehospitalization was had.  Ray shares that he knows his wife is "not doing well." He expresses that she is in pain and he no longer wishes to see her like this. I introduced the idea of transitioning her from aggressive medical treatment to transitioning her to a focus where we truly try to optimize her comfort. We talked about transition to comfort measures in house and what that would entail inclusive of medications to control pain, dyspnea, agitation, nausea, itching, and hiccups.  We discussed stopping all uneccessary measures such as blood draws, needle sticks, and frequent vital signs.   Ray shares that he has witnessed his wife suffer enough. He elected to make her comfortable and enable her to have a peaceful exit from this world. Utilized reflective listening throughout our time together.   Discussed the importance of continued conversation with family and their  medical providers regarding overall plan of care and treatment options, ensuring decisions are within the context of the patients values and GOCs.   I was able to touch base with patients bedside RN, Minette Brine to initiate  comfort oriented care.   I met with patients grandson and son at bedside with our chaplain, Dorian Pod. Explained our change in focus and present goals which they were in agreement with. Exited to enable Dorian Pod time to meet with the family to offer spiritual support.   Decision Maker: Precious Bard [phone]  SUMMARY  OF RECOMMENDATIONS   DNAR/DNI  Transition focus to comfort oriented care  Spiritual support is very much appreciated  Code Status/Advance Care Planning: DNAR/DNI   Symptom Management:  Dyspnea: Pain:  - Morphine gtt w/ boluses Fever:  - Tylenol 622m PO/PR Q6H PRN Agitation: Anxiety:  - Lorazepam 0.5-13mIV  Q1H PRN Nausea:  - Zofran 69m28mO/IV Q6H PRN  Secretions:  - Scopalamine Patch Dry Eyes:  - Artificial Tears PRN Xerostomia:  - Biotene 30m57mN  - BID oral care Urinary Retention:  - Bladder scan if > 250ml59mce and maintain foley  Constipation:  - Bisacodyl 10mg 43mRN QDay Spiritual:  - Chaplain consult   Palliative Prophylaxis:   Oral, Turn Q2H, Pain, Anxiety  Additional Recommendations (Limitations, Scope, Preferences):  Comfort Care   Psycho-social/Spiritual:   Desire for further Chaplaincy support: Yes   Additional Recommendations: Education on end of life care   Prognosis: Terminal anticipate hours to days  Discharge Planning: Discharge will bed celestial  PPS: 10%   This conversation/these recommendations were discussed with patient primary care team, Dr. Amin TReesa ChewIn: 0820 Time Out: 0930 Total Time: 70 Gre39er than 50%  of this time was spent counseling and coordinating care related to the above assessment and plan.  MichelShermanTeam Cell Phone: [phone] utilize secure chat with additional questions, if there is no response within 30 minutes please call the above phone number  Palliative Medicine Team providers are available by phone from 7am to 7pm daily and can be reached through the team cell phone.  Should this patient require assistance outside of these hours, please call the patient's attending physician.

## 2020-08-04 NOTE — Progress Notes (Signed)
   08/04/20 0945  Clinical Encounter Type  Visited With Patient and family together  Visit Type Spiritual support  Referral From Palliative care team  Consult/Referral To Chaplain  Spiritual Encounters  Spiritual Needs Other (Comment) (EOL care)  This chaplain responded to PMT consult for EOL spiritual care.  The chaplain understands from PMT NP-MYF Pt. has transitioned to [place].  The chaplain consulted with RN-Olivia before the visit.  The Pt. family(husband-Ray, son-Kurt and grand-son) are bedside.  The chaplain joined PMT NP-MYF during introduction and F/U visit with family. Ray shared with the chaplain he is glad the Pt. is resting comfortably now.  [name] is contacting outside clergy for a possible spiritual care visit today.  The chaplain will continue to be available for F/U spiritual care as needed.

## 2020-08-05 LAB — URINE CULTURE: Culture: >=100000 — AB

## 2020-08-08 ENCOUNTER — Encounter (HOSPITAL_COMMUNITY): Payer: Self-pay | Admitting: Hematology

## 2020-08-08 DIAGNOSIS — I5032 Chronic diastolic (congestive) heart failure: Secondary | ICD-10-CM | POA: Diagnosis not present

## 2020-08-08 DIAGNOSIS — R0602 Shortness of breath: Secondary | ICD-10-CM | POA: Diagnosis not present

## 2020-08-08 LAB — CULTURE, BLOOD (ROUTINE X 2)
Culture: NO GROWTH
Special Requests: ADEQUATE

## 2020-08-10 ENCOUNTER — Ambulatory Visit: Payer: Medicare Other | Admitting: Cardiovascular Disease

## 2020-08-10 ENCOUNTER — Other Ambulatory Visit: Payer: Self-pay | Admitting: Cardiovascular Disease

## 2020-08-10 ENCOUNTER — Other Ambulatory Visit: Payer: Self-pay | Admitting: Adult Health

## 2020-08-10 ENCOUNTER — Ambulatory Visit: Payer: Medicare Other | Admitting: Internal Medicine

## 2020-08-11 ENCOUNTER — Ambulatory Visit: Payer: Medicare Other | Admitting: Hematology

## 2020-08-11 ENCOUNTER — Ambulatory Visit: Payer: Medicare Other | Admitting: Gastroenterology
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
## 2020-09-01 NOTE — Progress Notes (Signed)
Pt pronounced with second RN Raymond at [facility] [employer]. MD on-call notified, [place] donor services notified, family contacted, RN was unable to reach pt spouse x3, pt son on file called and notified and pt son said he will go notify his father and they will come to the hospital. Post mortem checklist completed and pt body cleaned and prep. P. Angelica Pou RN

## 2020-09-01 NOTE — Discharge Summary (Signed)
Death Summary  Tamara Gregory WCH:[id number] DOB: [birth year] DOA: 2020/08/04  PCP: Binnie Rail, MD  Admit date: 04-Aug-2020 Date of Death: 2020-08-07 Time of Death: 2:30 AM Notification: Binnie Rail, MD notified of death of 2020-08-10   History of present illness:  Code Status= DNR  Brief Summary/Hospital Stay:  86 year old female with history of CHF EF 35%, chronic hypoxia on 2 L nasal cannula, worsening dementia, colon cancer status post partial colectomy, metastatic lung disease, osteoarthritis on chronic prednisone, hypothyroidism was brought to the the hospital with husband due to progressive decline and failure to thrive at home along with shortness of breath and confusion.  Upon admission she was found to have acute CVA with ICA thrombosis, atrial fibrillation with RVR, CHF exacerbation and urinary tract infection.  Given the extent of patient's CVA, CHF and poor prognosis she was transitioned to comfort care.  Eventually she ended up passing away overnight on 07-Aug-2020.  She was pronounced dead by the RN staff.  Night team was notified.   Final/Principal Diagnoses:   Acute right MCA infarct with occlusion of right distal M1  Acute respiratory distress secondary to congestive heart failure with reduced ejection fraction, EF 35%, class IV Acute on chronic hypoxia, currently on 6 L nasal cannula  Atrial fibrillation with RVR, improved  Sepsis present on admission as evidenced by tachycardia and leukocytosis. Urinary tract infection without hematuria  Severe aortic stenosis  Acute metabolic encephalopathy  History of colon cancer status post colectomy with suspicions of pulmonary nodule  Adult failure to thrive with moderate protein calorie malnutrition  Chronic adrenal insufficiency  Goals of care discussion   The results of significant diagnostics from this hospitalization (including imaging, microbiology, ancillary and laboratory) are listed below for reference.     Significant Diagnostic Studies: CT CEREBRAL PERFUSION W CONTRAST  Result Date: 2020-08-04 CLINICAL DATA:  Stroke.  Left-sided weakness and slurred speech EXAM: CT ANGIOGRAPHY HEAD AND NECK CT PERFUSION BRAIN TECHNIQUE: Multidetector CT imaging of the head and neck was performed using the standard protocol during bolus administration of intravenous contrast. Multiplanar CT image reconstructions and MIPs were obtained to evaluate the vascular anatomy. Carotid stenosis measurements (when applicable) are obtained utilizing NASCET criteria, using the distal internal carotid diameter as the denominator. Multiphase CT imaging of the brain was performed following IV bolus contrast injection. Subsequent parametric perfusion maps were calculated using RAPID software. CONTRAST:  167mL OMNIPAQUE IOHEXOL 350 MG/ML SOLN COMPARISON:  CT head 08/04/20 FINDINGS: CTA NECK FINDINGS Aortic arch: Atherosclerotic calcification throughout the aortic arch. Ascending aorta measures 42 mm in diameter. Descending thoracic aorta 26 mm in diameter. Negative for dissection. Eccentric plaque or thrombus of the distal arch measuring approximately 2 cm. This is distal to the left subclavian artery. Proximal great vessels patent without significant stenosis. Mild atherosclerotic disease at the origin the left common carotid and left subclavian artery. Right carotid system: Atherosclerotic calcification right carotid bifurcation. Less than 50% diameter stenosis proximal right internal carotid artery. Left carotid system: Atherosclerotic calcification in the carotid bulb. Adjacent to the calcification, there is a low-density filling defect in the lumen attached to the wall. This could be thrombus or soft plaque. Less than 50% diameter stenosis proximal left internal carotid artery. Kinking of the left internal carotid artery just above the plaque. Vertebral arteries: Both vertebral arteries patent to the basilar without stenosis. Skeleton:  Anterolisthesis C3-4 and C4-5. 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 Disc degeneration and spurring C6-7. 4 mm anterolisthesis C7-T1 which appears degenerative. No acute  skeletal abnormality. Other neck: Negative for mass or adenopathy. Upper chest: Widespread patchy small areas of infiltrate throughout the lungs bilaterally. Small bilateral pleural effusions. Review of the MIP images confirms the above findings CTA HEAD FINDINGS Anterior circulation: Occlusion right M1 segment distally. There is flow in the anterior and superior branches of the right middle cerebral artery due to collaterals with decreased opacification compared to the contralateral side. Both anterior cerebral arteries widely patent. Left middle cerebral artery widely patent without stenosis. Posterior circulation: Both vertebral arteries patent to the basilar. PICA patent on the right but not left. AICA, superior cerebellar, posterior cerebral arteries patent bilaterally without stenosis. Basilar patent without stenosis. Patent posterior communicating artery on the left. Venous sinuses: Normal venous enhancement. Anatomic variants: None Review of the MIP images confirms the above findings CT Brain Perfusion Findings: ASPECTS: 10 CBF (<30%) Volume: 4mL Perfusion (Tmax>6.0s) volume: 41mL Mismatch Volume: 30mL Infarction Location:Right MCA territory involving the temporal and parietal lobe. IMPRESSION: 1. CT perfusion positive for 98 mL ischemia in the right MCA territory involving the temporal and parietal lobe. No core infarct on CT perfusion. 2. Acute occlusion right distal M1 segment. There is collateral flow in the right superior and inferior division of the MCA. 3. Less than 50% diameter stenosis proximal right internal carotid artery due to calcific plaque 4. Atherosclerotic calcification left carotid bulb. Associated intraluminal filling defect which may be thrombus or soft plaque. Less than 50% diameter stenosis proximal left internal carotid artery. 5.  Both vertebral arteries patent to the basilar 6. Aortic Atherosclerosis (ICD10-I70.0). Aortic aneurysm. Ascending aorta 42 mm in diameter. 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 Recommend annual imaging followup by CTA or MRA. This recommendation follows 2010 ACCF/AHA/AATS/ACR/ASA/SCA/SCAI/SIR/STS/SVM Guidelines for the Diagnosis and Management of Patients with Thoracic Aortic Disease. Circulation. 2010; 121: P619-J093. Aortic aneurysm NOS (ICD10-I71.9) 7. These results were called by telephone at the time of interpretation on 08/12/2020 at 9:54 am to provider [facility] , who verbally acknowledged these results. 8. Extensive patchy airspace disease bilaterally. Correlate with COVID testing. Small pleural effusions. Electronically Signed   By: Franchot Gallo M.D.   On: 08/28/2020 09:56   DG CHEST PORT 1 VIEW  Result Date: 08/03/2020 CLINICAL DATA:  Dyspnea EXAM: PORTABLE CHEST 1 VIEW COMPARISON:  08/22/2020 FINDINGS: Single frontal view of the chest demonstrates an enlarged cardiac silhouette. Stable ectasia of the thoracic aorta with marked atherosclerosis. Persistent central vascular congestion with bilateral left greater than right airspace disease. The densest consolidation is within the left lower lobe. Suspected left effusion unchanged. No pneumothorax. IMPRESSION: 1. Constellation of findings most suggestive of congestive heart failure. Superimposed infection would be difficult to exclude. Slight progression since prior study. Electronically Signed   By: Randa Ngo M.D.   On: 08/03/2020 21:48   DG Chest Portable 1 View  Result Date: 08/18/2020 CLINICAL DATA:  Shortness of breath, leukocytosis EXAM: PORTABLE CHEST 1 VIEW COMPARISON:  03/27/2020 FINDINGS: Stable cardiomegaly. Known thoracic aortic aneurysm with extensive atherosclerosis. Patchy airspace opacity at the left lung base with small left pleural effusion. There are prominent interstitial  opacities throughout both lungs. No pneumothorax. Spinal stimulator leads within the thoracic spine are again noted. IMPRESSION: 1. Patchy airspace opacity at the left lung base with small left pleural effusion, concerning for pneumonia. 2. Prominent interstitial opacities throughout both lungs may represent edema versus atypical/viral infection. Electronically Signed   By: Davina Poke D.O.   On: 08/17/2020 10:48  ECHOCARDIOGRAM COMPLETE  Result Date: 08/03/2020    ECHOCARDIOGRAM REPORT   Patient Name:   Tamara Gregory Date of Exam: 08/03/2020 Medical Rec #:  [id number]      Height:       62.0 in Accession #:    [id number]     Weight:       150.0 lb Date of Birth:  1925-08-15      BSA:          1.692 m Patient Age:    74 years       BP:           123/86 mmHg Patient Gender: F              HR:           98 bpm. Exam Location:  Inpatient Procedure: 2D Echo, Cardiac Doppler, Color Doppler and Intracardiac            Opacification Agent Indications:    TIA 435.09 / G45.96  History:        Patient has prior history of Echocardiogram examinations, most                 recent 05/11/2019. Risk Factors:Hypertension and Dyslipidemia.                 GERD. Cancer.  Sonographer:    Jonelle Sidle Dance Referring Phys: [id number] [place]  1. Apex, septum and mid ventricular function down. Basal function preserved . Left ventricular ejection fraction, by estimation, is 30 to 35%. The left ventricle has moderately decreased function. The left ventricle demonstrates global hypokinesis. The left ventricular internal cavity size was mildly to moderately dilated. Left ventricular diastolic parameters are indeterminate.  2. Right ventricular systolic function is normal. The right ventricular size is normal. There is moderately elevated pulmonary artery systolic pressure.  3. Left atrial size was mildly dilated.  4. The mitral valve is degenerative. Mild mitral valve regurgitation. No evidence of mitral stenosis.  5.  Tricuspid valve regurgitation is mild to moderate.  6. Severe low flow AS AVA 0.72 cm2 and DVI 0.25. The aortic valve is tricuspid. Aortic valve regurgitation is trivial. Severe aortic valve stenosis.  7. Aortic dilatation noted. There is moderate dilatation of the ascending aorta measuring 44 mm.  8. The inferior vena cava is normal in size with greater than 50% respiratory variability, suggesting right atrial pressure of 3 mmHg. FINDINGS  Left Ventricle: Apex, septum and mid ventricular function down. Basal function preserved. Left ventricular ejection fraction, by estimation, is 30 to 35%. The left ventricle has moderately decreased function. The left ventricle demonstrates global hypokinesis. Definity contrast agent was given IV to delineate the left ventricular endocardial borders. The left ventricular internal cavity size was mildly to moderately dilated. There is no left ventricular hypertrophy. Left ventricular diastolic parameters are indeterminate. Right Ventricle: The right ventricular size is normal. No increase in right ventricular wall thickness. Right ventricular systolic function is normal. There is moderately elevated pulmonary artery systolic pressure. The tricuspid regurgitant velocity is 2.96 m/s, and with an assumed right atrial pressure of 15 mmHg, the estimated right ventricular systolic pressure is 78.2 mmHg. Left Atrium: Left atrial size was mildly dilated. Right Atrium: Right atrial size was normal in size. Pericardium: Trivial pericardial effusion is present. The pericardial effusion is posterior and lateral to the left ventricle. Mitral Valve: The mitral valve is degenerative in appearance. There is moderate thickening of the mitral valve leaflet(s). There is  moderate calcification of the mitral valve leaflet(s). Normal mobility of the mitral valve leaflets. Moderate mitral annular calcification. Mild mitral valve regurgitation. No evidence of mitral valve stenosis. Tricuspid Valve: The  tricuspid valve is normal in structure. Tricuspid valve regurgitation is mild to moderate. No evidence of tricuspid stenosis. Aortic Valve: Severe low flow AS AVA 0.72 cm2 and DVI 0.25. The aortic valve is tricuspid. . There is severe thickening and severe calcifcation of the aortic valve. Aortic valve regurgitation is trivial. Severe aortic stenosis is present. Severe aortic valve annular calcification. There is severe thickening of the aortic valve. There is severe calcifcation of the aortic valve. Aortic valve mean gradient measures 25.3 mmHg. Aortic valve peak gradient measures 40.0 mmHg. Aortic valve area, by VTI measures 0.70 cm. Pulmonic Valve: The pulmonic valve was normal in structure. Pulmonic valve regurgitation is not visualized. No evidence of pulmonic stenosis. Aorta: The aortic root is normal in size and structure and aortic dilatation noted. There is moderate dilatation of the ascending aorta measuring 44 mm. Venous: The inferior vena cava is normal in size with greater than 50% respiratory variability, suggesting right atrial pressure of 3 mmHg. IAS/Shunts: No atrial level shunt detected by color flow Doppler.  LEFT VENTRICLE PLAX 2D LVIDd:         4.40 cm LVIDs:         3.30 cm LV PW:         1.10 cm LV IVS:        1.00 cm LVOT diam:     1.90 cm LV SV:         44 LV SV Index:   26 LVOT Area:     2.84 cm  RIGHT VENTRICLE          IVC RV Basal diam:  2.30 cm  IVC diam: 2.20 cm TAPSE (M-mode): 1.2 cm LEFT ATRIUM             Index       RIGHT ATRIUM           Index LA diam:        4.20 cm 2.48 cm/m  RA Area:     18.50 cm LA Vol (A2C):   93.7 ml 55.39 ml/m RA Volume:   48.00 ml  28.37 ml/m LA Vol (A4C):   74.4 ml 43.98 ml/m LA Biplane Vol: 89.4 ml 52.85 ml/m  AORTIC VALVE AV Area (Vmax):    0.73 cm AV Area (Vmean):   0.72 cm AV Area (VTI):     0.70 cm AV Vmax:           316.33 cm/s AV Vmean:          240.667 cm/s AV VTI:            0.631 m AV Peak Grad:      40.0 mmHg AV Mean Grad:      25.3  mmHg LVOT Vmax:         80.90 cm/s LVOT Vmean:        61.150 cm/s LVOT VTI:          0.156 m LVOT/AV VTI ratio: 0.25  AORTA Ao Root diam: 3.10 cm Ao Asc diam:  4.40 cm MITRAL VALVE                TRICUSPID VALVE MV Area (PHT): 3.91 cm     TR Peak grad:   35.0 mmHg MV Decel Time: 194 msec     TR Vmax:  296.00 cm/s MV E velocity: 152.00 cm/s MV A velocity: 61.60 cm/s   SHUNTS MV E/A ratio:  2.47         Systemic VTI:  0.16 m                             Systemic Diam: 1.90 cm Jenkins Rouge MD Electronically signed by Jenkins Rouge MD Signature Date/Time: 08/03/2020/12:48:38 PM    Final    CT HEAD CODE STROKE WO CONTRAST  Result Date: 08/18/2020 CLINICAL DATA:  Code stroke.  Flaccid left side EXAM: CT HEAD WITHOUT CONTRAST TECHNIQUE: Contiguous axial images were obtained from the base of the skull through the vertex without intravenous contrast. COMPARISON:  03/10/2019 FINDINGS: Brain: No evidence of acute infarction, hemorrhage, hydrocephalus, extra-axial collection or mass lesion/mass effect. Atrophy and chronic small vessel ischemia in keeping with age. Vascular: No hyperdense vessel or unexpected calcification. Skull: Normal. Negative for fracture or focal lesion. Sinuses/Orbits: Bilateral cataract resection Other: These results were communicated to [facility] at 9:23 amon 09/03/2021by text page via the [facility] messaging system. ASPECTS [facility] Stroke Program Early CT Score) - Ganglionic level infarction (caudate, lentiform nuclei, internal capsule, insula, M1-M3 cortex): 7 - Supraganglionic infarction (M4-M6 cortex): 3 Total score (0-10 with 10 being normal): 10 IMPRESSION: 1. Aged brain without acute finding. 2. ASPECTS is 10.  No visible thalamic infarct. Electronically Signed   By: Monte Fantasia M.D.   On: 08/16/2020 09:25   CT ANGIO HEAD CODE STROKE  Result Date: 08/28/2020 CLINICAL DATA:  Stroke.  Left-sided weakness and slurred speech EXAM: CT ANGIOGRAPHY HEAD AND NECK CT PERFUSION BRAIN TECHNIQUE:  Multidetector CT imaging of the head and neck was performed using the standard protocol during bolus administration of intravenous contrast. Multiplanar CT image reconstructions and MIPs were obtained to evaluate the vascular anatomy. Carotid stenosis measurements (when applicable) are obtained utilizing NASCET criteria, using the distal internal carotid diameter as the denominator. Multiphase CT imaging of the brain was performed following IV bolus contrast injection. Subsequent parametric perfusion maps were calculated using RAPID software. CONTRAST:  130mL OMNIPAQUE IOHEXOL 350 MG/ML SOLN COMPARISON:  CT head 08/02/2020 FINDINGS: CTA NECK FINDINGS Aortic arch: Atherosclerotic calcification throughout the aortic arch. Ascending aorta measures 42 mm in diameter. Descending thoracic aorta 26 mm in diameter. Negative for dissection. Eccentric plaque or thrombus of the distal arch measuring approximately 2 cm. This is distal to the left subclavian artery. Proximal great vessels patent without significant stenosis. Mild atherosclerotic disease at the origin the left common carotid and left subclavian artery. Right carotid system: Atherosclerotic calcification right carotid bifurcation. Less than 50% diameter stenosis proximal right internal carotid artery. Left carotid system: Atherosclerotic calcification in the carotid bulb. Adjacent to the calcification, there is a low-density filling defect in the lumen attached to the wall. This could be thrombus or soft plaque. Less than 50% diameter stenosis proximal left internal carotid artery. Kinking of the left internal carotid artery just above the plaque. Vertebral arteries: Both vertebral arteries patent to the basilar without stenosis. Skeleton: Anterolisthesis C3-4 and C4-5. 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 Disc degeneration and spurring C6-7. 4 mm anterolisthesis C7-T1 which appears degenerative. No acute skeletal abnormality. Other neck: Negative for mass or adenopathy. Upper  chest: Widespread patchy small areas of infiltrate throughout the lungs bilaterally. Small bilateral pleural effusions. Review of the MIP images confirms the above findings CTA HEAD FINDINGS Anterior circulation: Occlusion right M1 segment distally. There is flow in the  anterior and superior branches of the right middle cerebral artery due to collaterals with decreased opacification compared to the contralateral side. Both anterior cerebral arteries widely patent. Left middle cerebral artery widely patent without stenosis. Posterior circulation: Both vertebral arteries patent to the basilar. PICA patent on the right but not left. AICA, superior cerebellar, posterior cerebral arteries patent bilaterally without stenosis. Basilar patent without stenosis. Patent posterior communicating artery on the left. Venous sinuses: Normal venous enhancement. Anatomic variants: None Review of the MIP images confirms the above findings CT Brain Perfusion Findings: ASPECTS: 10 CBF (<30%) Volume: 89mL Perfusion (Tmax>6.0s) volume: 23mL Mismatch Volume: 54mL Infarction Location:Right MCA territory involving the temporal and parietal lobe. IMPRESSION: 1. CT perfusion positive for 98 mL ischemia in the right MCA territory involving the temporal and parietal lobe. No core infarct on CT perfusion. 2. Acute occlusion right distal M1 segment. There is collateral flow in the right superior and inferior division of the MCA. 3. Less than 50% diameter stenosis proximal right internal carotid artery due to calcific plaque 4. Atherosclerotic calcification left carotid bulb. Associated intraluminal filling defect which may be thrombus or soft plaque. Less than 50% diameter stenosis proximal left internal carotid artery. 5. Both vertebral arteries patent to the basilar 6. Aortic Atherosclerosis (ICD10-I70.0). Aortic aneurysm. Ascending aorta 42 mm in diameter. 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  Recommend annual imaging followup by CTA or MRA. This recommendation follows 2010 ACCF/AHA/AATS/ACR/ASA/SCA/SCAI/SIR/STS/SVM Guidelines for the Diagnosis and Management of Patients with Thoracic Aortic Disease. Circulation. 2010; 121: W580-D983. Aortic aneurysm NOS (ICD10-I71.9) 7. These results were called by telephone at the time of interpretation on 08/03/2020 at 9:54 am to provider [facility] , who verbally acknowledged these results. 8. Extensive patchy airspace disease bilaterally. Correlate with COVID testing. Small pleural effusions. Electronically Signed   By: Franchot Gallo M.D.   On: 08/13/2020 09:56   CT ANGIO NECK CODE STROKE  Result Date: 08/04/2020 CLINICAL DATA:  Stroke.  Left-sided weakness and slurred speech EXAM: CT ANGIOGRAPHY HEAD AND NECK CT PERFUSION BRAIN TECHNIQUE: Multidetector CT imaging of the head and neck was performed using the standard protocol during bolus administration of intravenous contrast. Multiplanar CT image reconstructions and MIPs were obtained to evaluate the vascular anatomy. Carotid stenosis measurements (when applicable) are obtained utilizing NASCET criteria, using the distal internal carotid diameter as the denominator. Multiphase CT imaging of the brain was performed following IV bolus contrast injection. Subsequent parametric perfusion maps were calculated using RAPID software. CONTRAST:  126mL OMNIPAQUE IOHEXOL 350 MG/ML SOLN COMPARISON:  CT head 08/30/2020 FINDINGS: CTA NECK FINDINGS Aortic arch: Atherosclerotic calcification throughout the aortic arch. Ascending aorta measures 42 mm in diameter. Descending thoracic aorta 26 mm in diameter. Negative for dissection. Eccentric plaque or thrombus of the distal arch measuring approximately 2 cm. This is distal to the left subclavian artery. Proximal great vessels patent without significant stenosis. Mild atherosclerotic disease at the origin the left common carotid and left subclavian artery. Right carotid  system: Atherosclerotic calcification right carotid bifurcation. Less than 50% diameter stenosis proximal right internal carotid artery. Left carotid system: Atherosclerotic calcification in the carotid bulb. Adjacent to the calcification, there is a low-density filling defect in the lumen attached to the wall. This could be thrombus or soft plaque. Less than 50% diameter stenosis proximal left internal carotid artery. Kinking of the left internal carotid artery just above the plaque. Vertebral arteries: Both vertebral arteries patent to the basilar without stenosis. Skeleton: Anterolisthesis  C3-4 and C4-5. 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 Disc degeneration and spurring C6-7. 4 mm anterolisthesis C7-T1 which appears degenerative. No acute skeletal abnormality. Other neck: Negative for mass or adenopathy. Upper chest: Widespread patchy small areas of infiltrate throughout the lungs bilaterally. Small bilateral pleural effusions. Review of the MIP images confirms the above findings CTA HEAD FINDINGS Anterior circulation: Occlusion right M1 segment distally. There is flow in the anterior and superior branches of the right middle cerebral artery due to collaterals with decreased opacification compared to the contralateral side. Both anterior cerebral arteries widely patent. Left middle cerebral artery widely patent without stenosis. Posterior circulation: Both vertebral arteries patent to the basilar. PICA patent on the right but not left. AICA, superior cerebellar, posterior cerebral arteries patent bilaterally without stenosis. Basilar patent without stenosis. Patent posterior communicating artery on the left. Venous sinuses: Normal venous enhancement. Anatomic variants: None Review of the MIP images confirms the above findings CT Brain Perfusion Findings: ASPECTS: 10 CBF (<30%) Volume: 36mL Perfusion (Tmax>6.0s) volume: 33mL Mismatch Volume: 50mL Infarction Location:Right MCA territory involving the temporal and parietal lobe.  IMPRESSION: 1. CT perfusion positive for 98 mL ischemia in the right MCA territory involving the temporal and parietal lobe. No core infarct on CT perfusion. 2. Acute occlusion right distal M1 segment. There is collateral flow in the right superior and inferior division of the MCA. 3. Less than 50% diameter stenosis proximal right internal carotid artery due to calcific plaque 4. Atherosclerotic calcification left carotid bulb. Associated intraluminal filling defect which may be thrombus or soft plaque. Less than 50% diameter stenosis proximal left internal carotid artery. 5. Both vertebral arteries patent to the basilar 6. Aortic Atherosclerosis (ICD10-I70.0). Aortic aneurysm. Ascending aorta 42 mm in diameter. 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 Recommend annual imaging followup by CTA or MRA. This recommendation follows 2010 ACCF/AHA/AATS/ACR/ASA/SCA/SCAI/SIR/STS/SVM Guidelines for the Diagnosis and Management of Patients with Thoracic Aortic Disease. Circulation. 2010; 121: J825-K539. Aortic aneurysm NOS (ICD10-I71.9) 7. These results were called by telephone at the time of interpretation on 08/11/2020 at 9:54 am to provider [facility] , who verbally acknowledged these results. 8. Extensive patchy airspace disease bilaterally. Correlate with COVID testing. Small pleural effusions. Electronically Signed   By: Franchot Gallo M.D.   On: 08/12/2020 09:56    Microbiology: Recent Results (from the past 240 hour(s))  SARS Coronavirus 2 by RT PCR (hospital order, performed in [facility] hospital lab) Nasopharyngeal Nasopharyngeal Swab     Status: None   Collection Time: 08/18/2020 10:19 AM   Specimen: Nasopharyngeal Swab  Result Value Ref Range Status   SARS Coronavirus 2 NEGATIVE NEGATIVE Final    Comment: (NOTE) SARS-CoV-2 target nucleic acids are NOT DETECTED.  The SARS-CoV-2 RNA is generally detectable in upper and lower respiratory specimens  during the acute phase of infection. The lowest concentration of SARS-CoV-2 viral copies this assay can detect is 250 copies / mL. A negative result does not preclude SARS-CoV-2 infection and should not be used as the sole basis for treatment or other patient management decisions.  A negative result may occur with improper specimen collection / handling, submission of specimen other than nasopharyngeal swab, presence of viral mutation(s) within the areas targeted by this assay, and inadequate number of viral copies (<250 copies / mL). A negative result must be combined with clinical observations, patient history, and epidemiological information.  Fact Sheet for Patients:   StrictlyIdeas.no  Fact Sheet for Healthcare Providers: BankingDealers.co.za  This test  is not yet approved or  cleared by the [place] and has been authorized for detection and/or diagnosis of SARS-CoV-2 by FDA under an Emergency Use Authorization (EUA).  This EUA will remain in effect (meaning this test can be used) for the duration of the COVID-19 declaration under Section 564(b)(1) of the Act, 21 U.S.C. section 360bbb-3(b)(1), unless the authorization is terminated or revoked sooner.  Performed at [facility], [place] [address] [place], [place] [address]   Culture, Urine     Status: Abnormal   Collection Time: 08/03/20  2:30 AM   Specimen: Urine, Random  Result Value Ref Range Status   Specimen Description URINE, RANDOM  Final   Special Requests   Final    NONE Performed at [facility], [place] [address] [place], [place] [address]    Culture >=100,000 COLONIES/mL ESCHERICHIA COLI (A)  Final   Report Status 22-Aug-2020 FINAL  Final   Organism ID, Bacteria ESCHERICHIA COLI (A)  Final      Susceptibility   Escherichia coli - MIC*    AMPICILLIN 8 SENSITIVE Sensitive     CEFAZOLIN <=4 SENSITIVE Sensitive     CEFTRIAXONE <=0.25 SENSITIVE  Sensitive     CIPROFLOXACIN <=0.25 SENSITIVE Sensitive     GENTAMICIN <=1 SENSITIVE Sensitive     IMIPENEM <=0.25 SENSITIVE Sensitive     NITROFURANTOIN <=16 SENSITIVE Sensitive     TRIMETH/SULFA <=20 SENSITIVE Sensitive     AMPICILLIN/SULBACTAM 4 SENSITIVE Sensitive     PIP/TAZO <=4 SENSITIVE Sensitive     * >=100,000 COLONIES/mL ESCHERICHIA COLI  Culture, blood (Routine X 2) w Reflex to ID Panel     Status: None   Collection Time: 08/03/20 12:53 PM   Specimen: BLOOD RIGHT WRIST  Result Value Ref Range Status   Specimen Description BLOOD RIGHT WRIST  Final   Special Requests   Final    BOTTLES DRAWN AEROBIC AND ANAEROBIC Blood Culture adequate volume   Culture   Final    NO GROWTH 5 DAYS Performed at [facility], [address], [place], [place] [address]    Report Status 08/08/2020 FINAL  Final     Labs: Basic Metabolic Panel: Recent Labs  Lab 08/29/2020 0918 08/25/2020 0918 08/06/2020 0919 08/14/2020 0919 08/03/20 0248 08/03/20 1116  NA 136  --  137  --  142 142  K 4.5   < > 4.1   < > 4.4 3.7  CL 91*  --  90*  --  100  --   CO2  --   --  32  --  28  --   GLUCOSE 156*  --  154*  --  153*  --   BUN 34*  --  24*  --  17  --   CREATININE 0.90  --  1.07*  --  0.81  --   CALCIUM  --   --  9.1  --  8.6*  --   MG  --   --   --   --  2.1  --    < > = values in this interval not displayed.   Liver Function Tests: Recent Labs  Lab 08/03/2020 0919 08/03/20 0248  AST 37 19  ALT 33 31  ALKPHOS 83 82  BILITOT 1.3* 1.2  PROT 6.7 6.3*  ALBUMIN 3.2* 3.0*   No results for input(s): LIPASE, AMYLASE in the last 168 hours. No results for input(s): AMMONIA in the last 168 hours. CBC:  Recent Labs  Lab 08/18/2020 0918 08/23/2020 0919 08/03/20 0248 08/03/20 1116  WBC  --  19.6* 27.2*  --   NEUTROABS  --  13.1* 22.3*  --   HGB 11.2* 9.9* 9.8* 11.2*  HCT 33.0* 33.7* 35.5* 33.0*  MCV  --  95.2 99.4  --   PLT  --  273 254  --    Cardiac Enzymes: No results for input(s):  CKTOTAL, CKMB, CKMBINDEX, TROPONINI in the last 168 hours. D-Dimer No results for input(s): DDIMER in the last 72 hours. BNP: Invalid input(s): POCBNP CBG: Recent Labs  Lab 08/03/20 1808 08/03/20 2330 08/04/20 0636 08/04/20 1321 08/04/20 1810  GLUCAP 153* 139* 163* 154* 153*   Anemia work up No results for input(s): VITAMINB12, FOLATE, FERRITIN, TIBC, IRON, RETICCTPCT in the last 72 hours. Urinalysis    Component Value Date/Time   COLORURINE YELLOW 08/03/2020 0230   APPEARANCEUR HAZY (A) 08/03/2020 0230   LABSPEC >1.046 (H) 08/03/2020 0230   PHURINE 5.0 08/03/2020 0230   GLUCOSEU NEGATIVE 08/03/2020 0230   HGBUR NEGATIVE 08/03/2020 0230   BILIRUBINUR NEGATIVE 08/03/2020 0230   KETONESUR NEGATIVE 08/03/2020 0230   PROTEINUR 30 (A) 08/03/2020 0230   UROBILINOGEN 0.2 11/30/2014 1830   NITRITE NEGATIVE 08/03/2020 0230   LEUKOCYTESUR LARGE (A) 08/03/2020 0230   Sepsis Labs Invalid input(s): PROCALCITONIN,  WBC,  LACTICIDVEN    SIGNED:  Damita Lack, MD  [facility] 08/08/2020, 12:56 PM Pager   If 7PM-7AM, please contact night-coverage [email or web address] Password [email or web address]

## 2020-09-01 NOTE — Progress Notes (Signed)
Pt family up in room and RN and charge nurse offered condolences, support offered and given to family, pt placement notified, Chaplain notified. Delia Heady RN

## 2020-09-01 NOTE — Progress Notes (Signed)
°   2020-08-20 0500  Clinical Encounter Type  Visited With Family  Visit Type Death  Referral From Nurse  Consult/Referral To Chaplain  Spiritual Encounters  Spiritual Needs Prayer  Family requested prayer for Tamara Gregory end of life.

## 2020-09-01 NOTE — Progress Notes (Signed)
Pt body transferred to the morgue. Pt ring handed to family on visitation. Delia Heady RN

## 2020-09-01 DEATH — deceased

## 2020-09-08 ENCOUNTER — Other Ambulatory Visit: Payer: Medicare Other

## 2020-09-08 ENCOUNTER — Ambulatory Visit: Payer: Medicare Other | Admitting: Hematology

## 2020-09-15 ENCOUNTER — Telehealth: Payer: Medicare Other

## 2020-11-17 ENCOUNTER — Ambulatory Visit: Payer: Medicare Other | Admitting: Neurology

## 2020-12-15 ENCOUNTER — Ambulatory Visit: Payer: Medicare Other | Admitting: Internal Medicine
